# Patient Record
Sex: Female | Born: 1970
Health system: Southern US, Community
[De-identification: ages and names within clinical notes are randomized; demographics above are authoritative.]

## PROBLEM LIST (undated history)

## (undated) DIAGNOSIS — M51369 Other intervertebral disc degeneration, lumbar region without mention of lumbar back pain or lower extremity pain: Secondary | ICD-10-CM

## (undated) DIAGNOSIS — M5136 Other intervertebral disc degeneration, lumbar region: Secondary | ICD-10-CM

## (undated) DIAGNOSIS — M7989 Other specified soft tissue disorders: Secondary | ICD-10-CM

## (undated) DIAGNOSIS — F1010 Alcohol abuse, uncomplicated: Secondary | ICD-10-CM

## (undated) DIAGNOSIS — R7303 Prediabetes: Secondary | ICD-10-CM

## (undated) DIAGNOSIS — E559 Vitamin D deficiency, unspecified: Secondary | ICD-10-CM

## (undated) DIAGNOSIS — I10 Essential (primary) hypertension: Secondary | ICD-10-CM

## (undated) DIAGNOSIS — E785 Hyperlipidemia, unspecified: Secondary | ICD-10-CM

## (undated) DIAGNOSIS — G4733 Obstructive sleep apnea (adult) (pediatric): Secondary | ICD-10-CM

## (undated) DIAGNOSIS — E119 Type 2 diabetes mellitus without complications: Secondary | ICD-10-CM

## (undated) DIAGNOSIS — K219 Gastro-esophageal reflux disease without esophagitis: Secondary | ICD-10-CM

## (undated) DIAGNOSIS — R0683 Snoring: Secondary | ICD-10-CM

## (undated) DIAGNOSIS — F1990 Other psychoactive substance use, unspecified, uncomplicated: Secondary | ICD-10-CM

## (undated) DIAGNOSIS — M797 Fibromyalgia: Secondary | ICD-10-CM

## (undated) DIAGNOSIS — K760 Fatty (change of) liver, not elsewhere classified: Secondary | ICD-10-CM

## (undated) DIAGNOSIS — T7840XA Allergy, unspecified, initial encounter: Secondary | ICD-10-CM

## (undated) DIAGNOSIS — K5790 Diverticulosis of intestine, part unspecified, without perforation or abscess without bleeding: Secondary | ICD-10-CM

## (undated) DIAGNOSIS — R1310 Dysphagia, unspecified: Secondary | ICD-10-CM

## (undated) DIAGNOSIS — D649 Anemia, unspecified: Secondary | ICD-10-CM

## (undated) DIAGNOSIS — E669 Obesity, unspecified: Secondary | ICD-10-CM

## (undated) DIAGNOSIS — K648 Other hemorrhoids: Secondary | ICD-10-CM

## (undated) DIAGNOSIS — M1990 Unspecified osteoarthritis, unspecified site: Secondary | ICD-10-CM

## (undated) DIAGNOSIS — F419 Anxiety disorder, unspecified: Secondary | ICD-10-CM

## (undated) DIAGNOSIS — F32A Depression, unspecified: Secondary | ICD-10-CM

## (undated) DIAGNOSIS — R51 Headache: Secondary | ICD-10-CM

## (undated) DIAGNOSIS — F329 Major depressive disorder, single episode, unspecified: Secondary | ICD-10-CM

## (undated) DIAGNOSIS — G8929 Other chronic pain: Secondary | ICD-10-CM

## (undated) DIAGNOSIS — G43909 Migraine, unspecified, not intractable, without status migrainosus: Secondary | ICD-10-CM

## (undated) DIAGNOSIS — E871 Hypo-osmolality and hyponatremia: Secondary | ICD-10-CM

## (undated) DIAGNOSIS — M549 Dorsalgia, unspecified: Secondary | ICD-10-CM

## (undated) DIAGNOSIS — G4730 Sleep apnea, unspecified: Secondary | ICD-10-CM

## (undated) DIAGNOSIS — R945 Abnormal results of liver function studies: Secondary | ICD-10-CM

## (undated) DIAGNOSIS — R5382 Chronic fatigue, unspecified: Secondary | ICD-10-CM

## (undated) DIAGNOSIS — G4761 Periodic limb movement disorder: Secondary | ICD-10-CM

## (undated) DIAGNOSIS — F1011 Alcohol abuse, in remission: Secondary | ICD-10-CM

## (undated) DIAGNOSIS — M899 Disorder of bone, unspecified: Secondary | ICD-10-CM

## (undated) DIAGNOSIS — F17200 Nicotine dependence, unspecified, uncomplicated: Secondary | ICD-10-CM

## (undated) DIAGNOSIS — F1910 Other psychoactive substance abuse, uncomplicated: Secondary | ICD-10-CM

## (undated) DIAGNOSIS — E538 Deficiency of other specified B group vitamins: Secondary | ICD-10-CM

## (undated) DIAGNOSIS — G2581 Restless legs syndrome: Secondary | ICD-10-CM

## (undated) DIAGNOSIS — R519 Headache, unspecified: Secondary | ICD-10-CM

## (undated) DIAGNOSIS — R7989 Other specified abnormal findings of blood chemistry: Secondary | ICD-10-CM

## (undated) DIAGNOSIS — N189 Chronic kidney disease, unspecified: Secondary | ICD-10-CM

## (undated) DIAGNOSIS — G9332 Myalgic encephalomyelitis/chronic fatigue syndrome: Secondary | ICD-10-CM

## (undated) DIAGNOSIS — K635 Polyp of colon: Secondary | ICD-10-CM

## (undated) DIAGNOSIS — K732 Chronic active hepatitis, not elsewhere classified: Secondary | ICD-10-CM

## (undated) DIAGNOSIS — IMO0002 Reserved for concepts with insufficient information to code with codable children: Secondary | ICD-10-CM

## (undated) HISTORY — DX: Other specified abnormal findings of blood chemistry: R79.89

## (undated) HISTORY — DX: Other psychoactive substance use, unspecified, uncomplicated: F19.90

## (undated) HISTORY — DX: Other chronic pain: G89.29

## (undated) HISTORY — DX: Dorsalgia, unspecified: M54.9

## (undated) HISTORY — DX: Periodic limb movement disorder: G47.61

## (undated) HISTORY — DX: Chronic fatigue, unspecified: R53.82

## (undated) HISTORY — DX: Snoring: R06.83

## (undated) HISTORY — DX: Chronic active hepatitis, not elsewhere classified: K73.2

## (undated) HISTORY — DX: Abnormal results of liver function studies: R94.5

## (undated) HISTORY — DX: Other psychoactive substance abuse, uncomplicated: F19.10

## (undated) HISTORY — DX: Deficiency of other specified B group vitamins: E53.8

## (undated) HISTORY — PX: CERVICAL FUSION: SHX112

## (undated) HISTORY — DX: Other intervertebral disc degeneration, lumbar region without mention of lumbar back pain or lower extremity pain: M51.369

## (undated) HISTORY — DX: Major depressive disorder, single episode, unspecified: F32.9

## (undated) HISTORY — DX: Depression, unspecified: F32.A

## (undated) HISTORY — DX: Headache, unspecified: R51.9

## (undated) HISTORY — DX: Gastro-esophageal reflux disease without esophagitis: K21.9

## (undated) HISTORY — DX: Obstructive sleep apnea (adult) (pediatric): G47.33

## (undated) HISTORY — DX: Essential (primary) hypertension: I10

## (undated) HISTORY — DX: Dysphagia, unspecified: R13.10

## (undated) HISTORY — DX: Obesity, unspecified: E66.9

## (undated) HISTORY — PX: LIVER BIOPSY: SHX301

## (undated) HISTORY — DX: Fibromyalgia: M79.7

## (undated) HISTORY — DX: Allergy, unspecified, initial encounter: T78.40XA

## (undated) HISTORY — DX: Other specified soft tissue disorders: M79.89

## (undated) HISTORY — DX: Migraine, unspecified, not intractable, without status migrainosus: G43.909

## (undated) HISTORY — DX: Sleep apnea, unspecified: G47.30

## (undated) HISTORY — DX: Alcohol abuse, uncomplicated: F10.10

## (undated) HISTORY — DX: Alcohol abuse, in remission: F10.11

## (undated) HISTORY — DX: Disorder of bone, unspecified: M89.9

## (undated) HISTORY — DX: Nicotine dependence, unspecified, uncomplicated: F17.200

## (undated) HISTORY — DX: Chronic kidney disease, unspecified: N18.9

## (undated) HISTORY — DX: Diverticulosis of intestine, part unspecified, without perforation or abscess without bleeding: K57.90

## (undated) HISTORY — DX: Myalgic encephalomyelitis/chronic fatigue syndrome: G93.32

## (undated) HISTORY — DX: Other hemorrhoids: K64.8

## (undated) HISTORY — DX: Fatty (change of) liver, not elsewhere classified: K76.0

## (undated) HISTORY — DX: Anemia, unspecified: D64.9

## (undated) HISTORY — PX: COLONOSCOPY: SHX174

## (undated) HISTORY — DX: Restless legs syndrome: G25.81

## (undated) HISTORY — DX: Reserved for concepts with insufficient information to code with codable children: IMO0002

## (undated) HISTORY — DX: Hyperlipidemia, unspecified: E78.5

## (undated) HISTORY — DX: Polyp of colon: K63.5

## (undated) HISTORY — DX: Hypo-osmolality and hyponatremia: E87.1

## (undated) HISTORY — DX: Unspecified osteoarthritis, unspecified site: M19.90

## (undated) HISTORY — DX: Other intervertebral disc degeneration, lumbar region: M51.36

## (undated) HISTORY — PX: ABDOMINAL HYSTERECTOMY: SHX81

## (undated) HISTORY — DX: Anxiety disorder, unspecified: F41.9

## (undated) HISTORY — DX: Headache: R51

## (undated) HISTORY — DX: Vitamin D deficiency, unspecified: E55.9

## (undated) HISTORY — DX: Type 2 diabetes mellitus without complications: E11.9

---

## 1993-11-22 HISTORY — PX: OTHER SURGICAL HISTORY: SHX169

## 2003-04-20 ENCOUNTER — Emergency Department (HOSPITAL_COMMUNITY): Admission: EM | Admit: 2003-04-20 | Discharge: 2003-04-20 | Payer: Self-pay | Admitting: Emergency Medicine

## 2003-04-20 ENCOUNTER — Encounter: Payer: Self-pay | Admitting: Emergency Medicine

## 2003-11-23 HISTORY — PX: OTHER SURGICAL HISTORY: SHX169

## 2007-05-10 ENCOUNTER — Other Ambulatory Visit: Admission: RE | Admit: 2007-05-10 | Discharge: 2007-05-10 | Payer: Self-pay | Admitting: Gynecology

## 2007-12-31 ENCOUNTER — Emergency Department (HOSPITAL_COMMUNITY): Admission: EM | Admit: 2007-12-31 | Discharge: 2007-12-31 | Payer: Self-pay | Admitting: Emergency Medicine

## 2008-02-23 ENCOUNTER — Encounter: Admission: RE | Admit: 2008-02-23 | Discharge: 2008-02-23 | Payer: Self-pay | Admitting: Sports Medicine

## 2008-03-22 ENCOUNTER — Encounter: Admission: RE | Admit: 2008-03-22 | Discharge: 2008-03-22 | Payer: Self-pay | Admitting: Sports Medicine

## 2008-07-22 ENCOUNTER — Other Ambulatory Visit: Admission: RE | Admit: 2008-07-22 | Discharge: 2008-07-22 | Payer: Self-pay | Admitting: Gynecology

## 2008-07-25 ENCOUNTER — Ambulatory Visit: Payer: Self-pay | Admitting: Gynecology

## 2008-08-09 ENCOUNTER — Ambulatory Visit: Payer: Self-pay | Admitting: Gynecology

## 2008-08-09 HISTORY — PX: INTRAUTERINE DEVICE INSERTION: SHX323

## 2008-09-25 ENCOUNTER — Ambulatory Visit (HOSPITAL_COMMUNITY): Admission: RE | Admit: 2008-09-25 | Discharge: 2008-09-25 | Payer: Self-pay | Admitting: Gastroenterology

## 2008-10-18 ENCOUNTER — Ambulatory Visit (HOSPITAL_COMMUNITY): Admission: RE | Admit: 2008-10-18 | Discharge: 2008-10-18 | Payer: Self-pay | Admitting: Gastroenterology

## 2008-10-18 ENCOUNTER — Encounter (INDEPENDENT_AMBULATORY_CARE_PROVIDER_SITE_OTHER): Payer: Self-pay | Admitting: Interventional Radiology

## 2009-12-09 ENCOUNTER — Encounter: Admission: RE | Admit: 2009-12-09 | Discharge: 2009-12-09 | Payer: Self-pay | Admitting: Family Medicine

## 2010-09-15 ENCOUNTER — Other Ambulatory Visit: Admission: RE | Admit: 2010-09-15 | Discharge: 2010-09-15 | Payer: Self-pay | Admitting: Gynecology

## 2010-09-15 ENCOUNTER — Ambulatory Visit: Payer: Self-pay | Admitting: Gynecology

## 2010-11-11 ENCOUNTER — Ambulatory Visit: Payer: Self-pay | Admitting: Gynecology

## 2010-11-12 ENCOUNTER — Encounter
Admission: RE | Admit: 2010-11-12 | Discharge: 2010-11-12 | Payer: Self-pay | Source: Home / Self Care | Attending: Gynecology | Admitting: Gynecology

## 2010-11-12 ENCOUNTER — Ambulatory Visit: Payer: Self-pay | Admitting: Gynecology

## 2010-12-13 ENCOUNTER — Encounter: Payer: Self-pay | Admitting: Family Medicine

## 2010-12-13 ENCOUNTER — Encounter: Payer: Self-pay | Admitting: Sports Medicine

## 2011-05-31 ENCOUNTER — Other Ambulatory Visit: Payer: Self-pay | Admitting: Neurosurgery

## 2011-05-31 DIAGNOSIS — M542 Cervicalgia: Secondary | ICD-10-CM

## 2011-06-07 ENCOUNTER — Ambulatory Visit
Admission: RE | Admit: 2011-06-07 | Discharge: 2011-06-07 | Disposition: A | Discharge status: Home / Self Care | Payer: BC Managed Care – PPO | Source: Ambulatory Visit | Attending: Neurosurgery | Admitting: Neurosurgery

## 2011-06-07 DIAGNOSIS — M542 Cervicalgia: Secondary | ICD-10-CM

## 2011-06-07 MED ORDER — ONDANSETRON HCL 4 MG/2ML IJ SOLN
4.0000 mg | Freq: Once | INTRAMUSCULAR | Status: AC
  Administered 2011-06-07: 4 mg via INTRAMUSCULAR

## 2011-06-07 MED ORDER — DIAZEPAM 2 MG PO TABS
10.0000 mg | ORAL_TABLET | Freq: Once | ORAL | Status: AC
  Administered 2011-06-07: 10 mg via ORAL

## 2011-06-07 MED ORDER — HYDROMORPHONE HCL 2 MG/ML IJ SOLN
1.0000 mg | Freq: Once | INTRAMUSCULAR | Status: AC
  Administered 2011-06-07: 1 mg via INTRAMUSCULAR

## 2011-06-07 MED ORDER — ONDANSETRON HCL 4 MG/2ML IJ SOLN: 4.0000 mg | Freq: Once | INTRAMUSCULAR | Status: DC

## 2011-06-07 MED ORDER — IOHEXOL 300 MG/ML  SOLN
10.0000 mL | Freq: Once | INTRAMUSCULAR | Status: AC | PRN
  Administered 2011-06-07: 10 mL via INTRATHECAL

## 2011-06-07 NOTE — Progress Notes (Signed)
Pt states she has been off her Tramadol and Lexapro for the past two days.  jkl

## 2011-06-22 ENCOUNTER — Inpatient Hospital Stay (HOSPITAL_COMMUNITY): Payer: BC Managed Care – PPO

## 2011-06-22 ENCOUNTER — Inpatient Hospital Stay (HOSPITAL_COMMUNITY)
Admission: RE | Admit: 2011-06-22 | Discharge: 2011-06-24 | DRG: 865 | Disposition: A | Discharge status: Home / Self Care | Payer: BC Managed Care – PPO | Source: Ambulatory Visit | Attending: Neurosurgery | Admitting: Neurosurgery

## 2011-06-22 DIAGNOSIS — M5030 Other cervical disc degeneration, unspecified cervical region: Principal | ICD-10-CM | POA: Diagnosis present

## 2011-06-22 DIAGNOSIS — F17200 Nicotine dependence, unspecified, uncomplicated: Secondary | ICD-10-CM | POA: Diagnosis present

## 2011-06-22 LAB — HCG, SERUM, QUALITATIVE: Preg, Serum: NEGATIVE

## 2011-06-22 LAB — COMPREHENSIVE METABOLIC PANEL
ALT: 20 U/L (ref 0–35)
AST: 20 U/L (ref 0–37)
Albumin: 3.7 g/dL (ref 3.5–5.2)
Alkaline Phosphatase: 86 U/L (ref 39–117)
Glucose, Bld: 131 mg/dL — ABNORMAL HIGH (ref 70–99)
Potassium: 4.3 mEq/L (ref 3.5–5.1)
Sodium: 138 mEq/L (ref 135–145)
Total Protein: 7.1 g/dL (ref 6.0–8.3)

## 2011-06-22 LAB — CBC
HCT: 39.4 % (ref 36.0–46.0)
Platelets: 305 10*3/uL (ref 150–400)
RDW: 14.7 % (ref 11.5–15.5)
WBC: 12.4 10*3/uL — ABNORMAL HIGH (ref 4.0–10.5)

## 2011-06-29 NOTE — Op Note (Signed)
NAMENEVA, Gloria [name]              ACCOUNT NO.:  [id number]  MEDICAL RECORD NO.:  [id number]  LOCATION:  3006                         FACILITY:  [facility]  PHYSICIAN:  Leeroy Cha, M.D.   DATE OF BIRTH:  10-24-1971  DATE OF PROCEDURE:  06/22/2011 DATE OF DISCHARGE:                              OPERATIVE REPORT   PREOPERATIVE DIAGNOSES:  Degenerative disk disease, C4-C5, C5-C6, and C6- C7 with chronic radiculopathy, chronic neck pain, and lack of lordosis.  POSTOPERATIVE DIAGNOSES:  Degenerative disk disease, C4-C5, C5-C6, and C6-C7 with chronic radiculopathy, chronic neck pain, and lack of lordosis.  PROCEDURES:  C4-C5, C5-C6, and C6-C7 diskectomy, decompression of spinal cord, interbody fusion with PEEK cages, autograft local harvested, DBX, plate, microscope.  SURGEON:  Leeroy Cha, MD  ASSISTANT:  Earleen Newport, MD.  CLINICAL HISTORY:  [name] is a 40 year old female complaining of neck pain radiating to both upper extremities.  The pain is getting worse and has been going on for many years.  X-rays showed severe degenerative disk disease at C4-C5, C5-C6, and C6-C7 with spondylolisthesis at the level of C4-C5 and chronic radiculopathy and also lateral lordosis.  Surgery was advised.  The risks were explained to her.  PROCEDURE IN DETAIL:  The patient was taken to the OR, and after intubation, the left side of the neck was cleaned with DuraPrep. Transverse incision was made through the skin, subcutaneous tissues and down to the cervical spine.  The x-rays showed that we were at the level of C5-C6.  Then anteriorly the anterior osteophytes at the C4-C5, C5-C6, and C6-C7 were removed.  We started first on the level of C5-C6 where with the drill we removed the disk and we went straight into the cervical spine.  Posteriorly, the ligament was quite thick and appears to have quite a bit of narrowed over the foramen.  Decompression of spinal cord as well as  decompression of the second nerve root was done. The same procedure was done at the level of C4-C5 which has  osteophytes mostly on the right side.  Decompression was achieved once we opened the posterior ligament.  At the level of C6-C7,  she had the same finding of degenerative disk disease with foraminal narrowing.  At the end, we had good decompression at C4-C5, C5-C6, and C6-C7 with plenty of room for both nerves, C5, C6, and C7.  Then the endplate was drilled.  At the level of C5-C6, we introduced the PEEK cage with local harvested bone graft as well as DBX, 10 mm height, and lordotic.  Then at the level of C4-C5 and C6-C7 we introduced two 7 mm.  This was followed by plate using the screws.  Lateral cervical spine showed good position of the cages as well as the plate.  The area was irrigated.  We waited 10 minutes just to be sure we had good hemostasis.  Surgicel was applied to the area. Once this was done, the wound was closed with Vicryl and Steri-Strips.          ______________________________ Leeroy Cha, M.D.     EB/MEDQ  D:  06/22/2011  T:  06/23/2011  Job:  [id number]  Electronically Signed by  Leeroy Cha M.D. on 06/29/2011 01:13:20 PM

## 2011-06-29 NOTE — H&P (Signed)
  NAMEROZANNA, CORMANY NO.:  [id number]  MEDICAL RECORD NO.:  [id number]  LOCATION:  3006                         FACILITY:  [facility]  PHYSICIAN:  Leeroy Cha, M.D.   DATE OF BIRTH:  1971-07-06  DATE OF ADMISSION:  06/22/2011 DATE OF DISCHARGE:                             HISTORY & PHYSICAL   __________ I saw this lady again in my office 2 days ago after she has __________ followup __________ bilateral __________.  The strength __________ normal.  __________ degenerative disk disease __________ myelogram showed that she has osteophyte complex in the right side at L4- 5 __________.  With a lack of lordosis, degenerative disk disease at L4-5, L5-6-, L6-7 __________.  The procedure will be a decompression of the L4-5, L5-6, L6- 7 __________.  The patient knew the risk of the surgery include __________ whatsoever __________          ______________________________ Leeroy Cha, M.D.  ----------------------------------------------------------history and physical was redictated on 06/28/11 r  EB/MEDQ  D:  06/22/2011  T:  06/22/2011  Job:  [id number]  Electronically Signed by Leeroy Cha M.D. on 06/29/2011 01:17:21 PM

## 2011-06-29 NOTE — H&P (Signed)
  NAMETIJA, BISS NO.:  [id number]  MEDICAL RECORD NO.:  [id number]  LOCATION:  5789                         FACILITY:  [facility]  PHYSICIAN:  Leeroy Cha, M.D.   DATE OF BIRTH:  26-Nov-1970  DATE OF ADMISSION:  06/22/2011 DATE OF DISCHARGE:  06/24/2011                             HISTORY & PHYSICAL   Ms. Payne is a lady who came to my office in February 2011 complaining of neck pain going to the shoulder, pain both elbows, pain in the knees, and both leg achiness, difficulty with walking.  This problem had been going on for many years.  She has had conservative treatment.  She had an MRI of the cervical spine and she was sent to [place] for further evaluation.  PAST MEDICAL HISTORY:  She had some type of hand surgery by Dr. Percell Miller, also kidney surgery.  She is allergic to BENADRYL.  MEDICATIONS:  She is taking Lexapro, Lunesta, and gabapentin, and magnesium.  SOCIAL HISTORY:  She smokes a pack a day.  She does not drink.  She used to drink until about 2 years ago.  FAMILY HISTORY:  Her mother is 43 years old with asthma.  Father has Barrett syndrome.  REVIEW OF SYSTEMS:  Positive for neck pain, arm pain, depression, and incoordination.  PHYSICAL EXAMINATION:  HEAD, EYES EARS, NOSE AND THROAT:  Normal. NECK:  She is able to flex but extension and lateralization causes discomfort and pain.  She has also tenderness in the trapezius muscle. LUNGS:  Clear. HEART:  Sounds normal. ABDOMEN:  Normal. EXTREMITIES:  Normal pulses. NEUROLOGIC:  Mental status normal.  Cranial nerves normal.  Sensation is normal.  She has weakness of the biceps.  She also has a tingling sensation going to both hands.  The cervical spine x-rays as well as the MRI show reversal of the normal lordosis of the cervical spine with a central disk at the level of C4-C5 with displacement of the thecal sac and spinal cord and disk at the level of C5-6 and C6-7.  The nerve conduction  test showed mild carpal tunnel syndrome.  CLINICAL IMPRESSION:  Degenerative disk disease cervical C4-5, C5-6, and C6-7 with kyphosis.  History of carpal tunnel syndrome.  RECOMMENDATIONS:  The patient is being admitted for surgery.  The procedure will be decompression and fusion at the level of cervical C4- 5, C5-6, and C6-7.  The patient knew the risk with the surgery, was fully explained to her by me in my office on June 09, 2011.  The risk of course are among them infection, CSF leak, no improvement whatsoever because of the chronicity of the pain.          ______________________________ Leeroy Cha, M.D.     EB/MEDQ  D:  06/28/2011  T:  06/29/2011  Job:  [id number]  Electronically Signed by Leeroy Cha M.D. on 06/29/2011 01:13:37 PM

## 2011-08-24 ENCOUNTER — Ambulatory Visit: Payer: BC Managed Care – PPO | Attending: Neurosurgery

## 2011-08-24 DIAGNOSIS — M2569 Stiffness of other specified joint, not elsewhere classified: Secondary | ICD-10-CM | POA: Insufficient documentation

## 2011-08-24 DIAGNOSIS — IMO0001 Reserved for inherently not codable concepts without codable children: Secondary | ICD-10-CM | POA: Insufficient documentation

## 2011-08-24 DIAGNOSIS — M542 Cervicalgia: Secondary | ICD-10-CM | POA: Insufficient documentation

## 2011-08-24 LAB — PROTIME-INR
INR: 1
Prothrombin Time: 13.2

## 2011-08-24 LAB — CBC
HCT: 34 — ABNORMAL LOW
MCHC: 32.9
MCV: 81.9
Platelets: 274
RDW: 14
WBC: 9.3

## 2011-09-02 ENCOUNTER — Ambulatory Visit: Payer: BC Managed Care – PPO

## 2011-09-06 ENCOUNTER — Encounter: Payer: BC Managed Care – PPO | Admitting: Physical Therapy

## 2011-09-08 ENCOUNTER — Encounter: Payer: BC Managed Care – PPO | Admitting: Physical Therapy

## 2011-09-09 ENCOUNTER — Ambulatory Visit: Payer: BC Managed Care – PPO

## 2011-09-13 ENCOUNTER — Ambulatory Visit: Payer: BC Managed Care – PPO

## 2011-09-16 ENCOUNTER — Encounter: Payer: Self-pay | Admitting: Anesthesiology

## 2011-09-20 ENCOUNTER — Encounter: Payer: BC Managed Care – PPO | Admitting: Physical Therapy

## 2011-09-22 ENCOUNTER — Encounter: Payer: BC Managed Care – PPO | Admitting: Gynecology

## 2011-09-22 ENCOUNTER — Encounter: Payer: BC Managed Care – PPO | Admitting: Physical Therapy

## 2011-09-23 ENCOUNTER — Ambulatory Visit: Payer: BC Managed Care – PPO | Attending: Neurosurgery

## 2011-09-23 DIAGNOSIS — IMO0001 Reserved for inherently not codable concepts without codable children: Secondary | ICD-10-CM | POA: Insufficient documentation

## 2011-09-23 DIAGNOSIS — M542 Cervicalgia: Secondary | ICD-10-CM | POA: Insufficient documentation

## 2011-09-23 DIAGNOSIS — M2569 Stiffness of other specified joint, not elsewhere classified: Secondary | ICD-10-CM | POA: Insufficient documentation

## 2011-09-27 ENCOUNTER — Ambulatory Visit: Payer: BC Managed Care – PPO

## 2011-09-29 ENCOUNTER — Encounter: Payer: BC Managed Care – PPO | Admitting: Physical Therapy

## 2011-09-30 ENCOUNTER — Ambulatory Visit: Payer: BC Managed Care – PPO | Admitting: Physical Therapy

## 2011-10-07 ENCOUNTER — Ambulatory Visit: Payer: BC Managed Care – PPO

## 2011-10-18 ENCOUNTER — Encounter: Payer: BC Managed Care – PPO | Admitting: Physical Therapy

## 2011-10-20 ENCOUNTER — Ambulatory Visit: Payer: BC Managed Care – PPO | Admitting: Physical Therapy

## 2011-10-26 ENCOUNTER — Ambulatory Visit: Payer: BC Managed Care – PPO | Attending: Neurosurgery

## 2011-10-26 DIAGNOSIS — M2569 Stiffness of other specified joint, not elsewhere classified: Secondary | ICD-10-CM | POA: Insufficient documentation

## 2011-10-26 DIAGNOSIS — M542 Cervicalgia: Secondary | ICD-10-CM | POA: Insufficient documentation

## 2011-10-26 DIAGNOSIS — IMO0001 Reserved for inherently not codable concepts without codable children: Secondary | ICD-10-CM | POA: Insufficient documentation

## 2011-11-01 ENCOUNTER — Encounter: Payer: BC Managed Care – PPO | Admitting: Physical Therapy

## 2011-11-03 ENCOUNTER — Ambulatory Visit: Payer: BC Managed Care – PPO

## 2012-02-10 ENCOUNTER — Other Ambulatory Visit: Payer: Self-pay | Admitting: Family Medicine

## 2012-02-10 ENCOUNTER — Other Ambulatory Visit: Payer: Self-pay | Admitting: Gynecology

## 2012-02-10 DIAGNOSIS — Z1231 Encounter for screening mammogram for malignant neoplasm of breast: Secondary | ICD-10-CM

## 2012-02-14 ENCOUNTER — Ambulatory Visit
Admission: RE | Admit: 2012-02-14 | Discharge: 2012-02-14 | Disposition: A | Discharge status: Home / Self Care | Payer: BC Managed Care – PPO | Source: Ambulatory Visit | Attending: Family Medicine | Admitting: Family Medicine

## 2012-02-14 DIAGNOSIS — Z1231 Encounter for screening mammogram for malignant neoplasm of breast: Secondary | ICD-10-CM

## 2012-10-11 ENCOUNTER — Encounter: Payer: Self-pay | Admitting: *Deleted

## 2012-10-11 NOTE — Progress Notes (Signed)
Patient ID: Gloria [name], female   DOB: 1971/01/02, 41 y.o.   MRN: [id number] Pt has annual on dec 6 requesting [place] level checked prior to appointment. Left message on vm that she will have blood drawn that day as well. Left message for her to call.

## 2012-10-27 ENCOUNTER — Encounter: Payer: Self-pay | Admitting: Gynecology

## 2012-10-27 ENCOUNTER — Ambulatory Visit (INDEPENDENT_AMBULATORY_CARE_PROVIDER_SITE_OTHER): Payer: BC Managed Care – PPO | Admitting: Gynecology

## 2012-10-27 VITALS — BP 138/88 | Ht 64.25 in | Wt 227.0 lb

## 2012-10-27 DIAGNOSIS — N951 Menopausal and female climacteric states: Secondary | ICD-10-CM

## 2012-10-27 DIAGNOSIS — F1011 Alcohol abuse, in remission: Secondary | ICD-10-CM | POA: Insufficient documentation

## 2012-10-27 DIAGNOSIS — F418 Other specified anxiety disorders: Secondary | ICD-10-CM | POA: Insufficient documentation

## 2012-10-27 DIAGNOSIS — Z23 Encounter for immunization: Secondary | ICD-10-CM

## 2012-10-27 DIAGNOSIS — R635 Abnormal weight gain: Secondary | ICD-10-CM

## 2012-10-27 DIAGNOSIS — Z01419 Encounter for gynecological examination (general) (routine) without abnormal findings: Secondary | ICD-10-CM

## 2012-10-27 DIAGNOSIS — K219 Gastro-esophageal reflux disease without esophagitis: Secondary | ICD-10-CM | POA: Insufficient documentation

## 2012-10-27 DIAGNOSIS — M797 Fibromyalgia: Secondary | ICD-10-CM | POA: Insufficient documentation

## 2012-10-27 DIAGNOSIS — Z87891 Personal history of nicotine dependence: Secondary | ICD-10-CM

## 2012-10-27 LAB — CHOLESTEROL, TOTAL: Cholesterol: 189 mg/dL (ref 0–200)

## 2012-10-27 NOTE — Patient Instructions (Addendum)
Cholesterol Control Diet  Cholesterol levels in your body are determined significantly by your diet. Cholesterol levels may also be related to heart disease. The following material helps to explain this relationship and discusses what you can do to help keep your heart healthy. Not all cholesterol is bad. Low-density lipoprotein (LDL) cholesterol is the "bad" cholesterol. It may cause fatty deposits to build up inside your arteries. High-density lipoprotein (HDL) cholesterol is "good." It helps to remove the "bad" LDL cholesterol from your blood. Cholesterol is a very important risk factor for heart disease. Other risk factors are high blood pressure, smoking, stress, heredity, and weight. The heart muscle gets its supply of blood through the coronary arteries. If your LDL cholesterol is high and your HDL cholesterol is low, you are at risk for having fatty deposits build up in your coronary arteries. This leaves less room through which blood can flow. Without sufficient blood and oxygen, the heart muscle cannot function properly and you may feel chest pains (angina pectoris). When a coronary artery closes up entirely, a part of the heart muscle may die, causing a heart attack (myocardial infarction). CHECKING CHOLESTEROL When your caregiver sends your blood to a lab to be analyzed for cholesterol, a complete lipid (fat) profile may be done. With this test, the total amount of cholesterol and levels of LDL and HDL are determined. Triglycerides are a type of fat that circulates in the blood and can also be used to determine heart disease risk. The list below describes what the numbers should be: Test: Total Cholesterol.  Less than 200 mg/dl.  Test: LDL "bad cholesterol."  Less than 100 mg/dl.   Less than 70 mg/dl if you are at very high risk of a heart attack or sudden cardiac death.  Test: HDL "good cholesterol."  Greater than 50 mg/dl for women.    Greater than 40 mg/dl for men.  Test: Triglycerides.  Less than 150 mg/dl.  CONTROLLING CHOLESTEROL WITH DIET Although exercise and lifestyle factors are important, your diet is key. That is because certain foods are known to raise cholesterol and others to lower it. The goal is to balance foods for their effect on cholesterol and more importantly, to replace saturated and trans fat with other types of fat, such as monounsaturated fat, polyunsaturated fat, and omega-3 fatty acids. On average, a person should consume no more than 15 to 17 g of saturated fat daily. Saturated and trans fats are considered "bad" fats, and they will raise LDL cholesterol. Saturated fats are primarily found in animal products such as meats, butter, and cream. However, that does not mean you need to sacrifice all your favorite foods. Today, there are good tasting, low-fat, low-cholesterol substitutes for most of the things you like to eat. Choose low-fat or nonfat alternatives. Choose round or loin cuts of red meat, since these types of cuts are lowest in fat and cholesterol. Chicken (without the skin), fish, veal, and ground [place] breast are excellent choices. Eliminate fatty meats, such as hot dogs and salami. Even shellfish have little or no saturated fat. Have a 3 oz (85 g) portion when you eat lean meat, poultry, or fish. Trans fats are also called "partially hydrogenated oils." They are oils that have been scientifically manipulated so that they are solid at room temperature resulting in a longer shelf life and improved taste and texture of foods in which they are added. Trans fats are found in stick margarine, some tub margarines, cookies, crackers, and baked goods.  When baking and cooking, oils are an excellent substitute for butter. The monounsaturated oils are especially beneficial since it is believed they lower LDL and raise HDL. The oils you should avoid entirely are saturated tropical oils, such as coconut and  palm.  Remember to eat liberally from food groups that are naturally free of saturated and trans fat, including fish, fruit, vegetables, beans, grains (barley, rice, couscous, bulgur wheat), and pasta (without cream sauces).  IDENTIFYING FOODS THAT LOWER CHOLESTEROL  Soluble fiber may lower your cholesterol. This type of fiber is found in fruits such as apples, vegetables such as broccoli, potatoes, and carrots, legumes such as beans, peas, and lentils, and grains such as barley. Foods fortified with plant sterols (phytosterol) may also lower cholesterol. You should eat at least 2 g per day of these foods for a cholesterol lowering effect.  Read package labels to identify low-saturated fats, trans fats free, and low-fat foods at the supermarket. Select cheeses that have only 2 to 3 g saturated fat per ounce. Use a heart-healthy tub margarine that is free of trans fats or partially hydrogenated oil. When buying baked goods (cookies, crackers), avoid partially hydrogenated oils. Breads and muffins should be made from whole grains (whole-wheat or whole oat flour, instead of "flour" or "enriched flour"). Buy non-creamy canned soups with reduced salt and no added fats.  FOOD PREPARATION TECHNIQUES  Never deep-fry. If you must fry, either stir-fry, which uses very little fat, or use non-stick cooking sprays. When possible, broil, bake, or roast meats, and steam vegetables. Instead of dressing vegetables with butter or margarine, use lemon and herbs, applesauce and cinnamon (for squash and sweet potatoes), nonfat yogurt, salsa, and low-fat dressings for salads.  LOW-SATURATED FAT / LOW-FAT FOOD SUBSTITUTES Meats / Saturated Fat (g)  Avoid: Steak, marbled (3 oz/85 g) / 11 g   Choose: Steak, lean (3 oz/85 g) / 4 g   Avoid: Hamburger (3 oz/85 g) / 7 g   Choose: Hamburger, lean (3 oz/85 g) / 5 g   Avoid: Ham (3 oz/85 g) / 6 g   Choose: Ham, lean cut (3 oz/85 g) / 2.4 g   Avoid: Chicken, with skin, dark  meat (3 oz/85 g) / 4 g   Choose: Chicken, skin removed, dark meat (3 oz/85 g) / 2 g   Avoid: Chicken, with skin, light meat (3 oz/85 g) / 2.5 g   Choose: Chicken, skin removed, light meat (3 oz/85 g) / 1 g  Dairy / Saturated Fat (g)  Avoid: Whole milk (1 cup) / 5 g   Choose: Low-fat milk, 2% (1 cup) / 3 g   Choose: Low-fat milk, 1% (1 cup) / 1.5 g   Choose: Skim milk (1 cup) / 0.3 g   Avoid: Hard cheese (1 oz/28 g) / 6 g   Choose: Skim milk cheese (1 oz/28 g) / 2 to 3 g   Avoid: Cottage cheese, 4% fat (1 cup) / 6.5 g   Choose: Low-fat cottage cheese, 1% fat (1 cup) / 1.5 g   Avoid: Ice cream (1 cup) / 9 g   Choose: Sherbet (1 cup) / 2.5 g   Choose: Nonfat frozen yogurt (1 cup) / 0.3 g   Choose: Frozen fruit bar / trace   Avoid: Whipped cream (1 tbs) / 3.5 g   Choose: Nondairy whipped topping (1 tbs) / 1 g  Condiments / Saturated Fat (g)  Avoid: Mayonnaise (1 tbs) / 2 g   Choose: Low-fat  mayonnaise (1 tbs) / 1 g   Avoid: Butter (1 tbs) / 7 g   Choose: Extra light margarine (1 tbs) / 1 g   Avoid: Coconut oil (1 tbs) / 11.8 g   Choose: Olive oil (1 tbs) / 1.8 g   Choose: Corn oil (1 tbs) / 1.7 g   Choose: Safflower oil (1 tbs) / 1.2 g   Choose: Sunflower oil (1 tbs) / 1.4 g   Choose: Soybean oil (1 tbs) / 2.4 g   Choose: Canola oil (1 tbs) / 1 g  Document Released: 11/08/2005 Document Revised: 07/21/2011 Document Reviewed: 04/29/2011 [facility] Patient Information 2012 [place], [place].  Exercise to Lose Weight Exercise and a healthy diet may help you lose weight. Your doctor may suggest specific exercises. EXERCISE IDEAS AND TIPS  Choose low-cost things you enjoy doing, such as walking, bicycling, or exercising to workout videos.   Take stairs instead of the elevator.   Walk during your lunch break.   Park your car further away from work or school.   Go to a gym or an exercise class.   Start with 5 to 10 minutes of exercise each day. Build up to  30 minutes of exercise 4 to 6 days a week.   Wear shoes with good support and comfortable clothes.   Stretch before and after working out.   Work out until you breathe harder and your heart beats faster.   Drink extra water when you exercise.   Do not do so much that you hurt yourself, feel dizzy, or get very short of breath.  Exercises that burn about 150 calories:  Running 1  miles in 15 minutes.   Playing volleyball for 45 to 60 minutes.   Washing and waxing a car for 45 to 60 minutes.   Playing touch football for 45 minutes.   Walking 1  miles in 35 minutes.   Pushing a stroller 1  miles in 30 minutes.   Playing basketball for 30 minutes.   Raking leaves for 30 minutes.   Bicycling 5 miles in 30 minutes.   Walking 2 miles in 30 minutes.   Dancing for 30 minutes.   Shoveling snow for 15 minutes.   Swimming laps for 20 minutes.   Walking up stairs for 15 minutes.   Bicycling 4 miles in 15 minutes.   Gardening for 30 to 45 minutes.   Jumping rope for 15 minutes.   Washing windows or floors for 45 to 60 minutes.  Document Released: 12/11/2010 Document Revised: 07/21/2011 Document Reviewed: 12/11/2010 [facility] Patient Information 2012 [place]. Perimenopause Perimenopause is the time when your body begins to move into the menopause (no menstrual period for 12 straight months). It is a natural process. Perimenopause can begin 2 to 8 years before the menopause and usually lasts for one year after the menopause. During this time, your ovaries may or may not produce an egg. The ovaries vary in their production of estrogen and progesterone hormones each month. This can cause irregular menstrual periods, difficulty in getting pregnant, vaginal bleeding between periods and uncomfortable symptoms. CAUSES  Irregular production of the ovarian hormones, estrogen and progesterone, and not ovulating every month.  Other causes include:  Tumor of the pituitary gland  in the brain.  Medical disease that affects the ovaries.  Radiation treatment.  Chemotherapy.  Unknown causes.  Heavy smoking and excessive alcohol intake can bring on perimenopause sooner. SYMPTOMS   Hot flashes.  Night sweats.  Irregular menstrual  periods.  Decrease sex drive.  Vaginal dryness.  Headaches.  Mood swings.  Depression.  Memory problems.  Irritability.  Tiredness.  Weight gain.  Trouble getting pregnant.  The beginning of losing bone cells (osteoporosis).  The beginning of hardening of the arteries (atherosclerosis). DIAGNOSIS  Your caregiver will make a diagnosis by analyzing your age, menstrual history and your symptoms. They will do a physical exam noting any changes in your body, especially your female organs. Female hormone tests may or may not be helpful depending on the amount and when you produce the female hormones. However, other hormone tests may be helpful (ex. thyroid hormone) to rule out other problems. TREATMENT  The decision to treat during the perimenopause should be made by you and your caregiver depending on how the symptoms are affecting you and your life style. There are various treatments available such as:  Treating individual symptoms with a specific medication for that symptom (ex. tranquilizer for depression).  Herbal medications that can help specific symptoms.  Counseling.  Group therapy.  No treatment. HOME CARE INSTRUCTIONS   Before seeing your caregiver, make a list of your menstrual periods (when the occur, how heavy they are, how long between periods and how long they last), your symptoms and when they started.  Take the medication as recommended by your caregiver.  Sleep and rest.  Exercise.  Eat a diet that contains calcium (good for your bones) and soy (acts like estrogen hormone).  Do not smoke.  Avoid alcoholic beverages.  Taking vitamin E may help in certain cases.  Take calcium and vitamin  D supplements to help prevent bone loss.  Group therapy is sometimes helpful.  Acupuncture may help in some cases. SEEK MEDICAL CARE IF:   You have any of the above and want to know if it is perimenopause.  You want advice and treatment for any of your symptoms mentioned above.  You need a referral to a specialist (gynecologist, psychiatrist or psychologist). SEEK IMMEDIATE MEDICAL CARE IF:   You have vaginal bleeding.  Your period lasts longer than 8 days.  You periods are recurring sooner than 21 days.  You have bleeding after intercourse.  You have severe depression.  You have pain when you urinate.  You have severe headaches.  You develop vision problems. Document Released: 12/16/2004 Document Revised: 01/31/2012 Document Reviewed: 09/05/2008 [facility] Patient Information 2013 [place].   Tetanus, Diphtheria, Pertussis (Tdap) Vaccine What You Need to Know WHY GET VACCINATED? Tetanus, diphtheria and pertussis can be very serious diseases, even for adolescents and adults. Tdap vaccine can protect [place] from these diseases. TETANUS (Lockjaw) causes painful muscle tightening and stiffness, usually all over the body.  It can lead to tightening of muscles in the head and neck so you can't open your mouth, swallow, or sometimes even breathe. Tetanus kills about 1 out of 5 people who are infected. DIPHTHERIA can cause a thick coating to form in the back of the throat.  It can lead to breathing problems, paralysis, heart failure, and death. PERTUSSIS (Whooping Cough) causes severe coughing spells, which can cause difficulty breathing, vomiting and disturbed sleep.  It can also lead to weight loss, incontinence, and rib fractures. Up to 2 in 100 adolescents and 5 in 100 adults with pertussis are hospitalized or have complications, which could include pneumonia and death. These diseases are caused by bacteria. Diphtheria and pertussis are spread from person to person through  coughing or sneezing. Tetanus enters the body through cuts, scratches,  or wounds. Before vaccines, the [place] saw as many as 200,000 cases a year of diphtheria and pertussis, and hundreds of cases of tetanus. Since vaccination began, tetanus and diphtheria have dropped by about 99% and pertussis by about 80%. TDAP VACCINE Tdap vaccine can protect adolescents and adults from tetanus, diphtheria, and pertussis. One dose of Tdap is routinely given at age 43 or 72. People who did not get Tdap at that age should get it as soon as possible. Tdap is especially important for health care professionals and anyone having close contact with a baby younger than 12 months. Pregnant women should get a dose of Tdap during every pregnancy, to protect the newborn from pertussis. Infants are most at risk for severe, life-threatening complications from pertussis. A similar vaccine, called Td, protects from tetanus and diphtheria, but not pertussis. A Td booster should be given every 10 years. Tdap may be given as one of these boosters if you have not already gotten a dose. Tdap may also be given after a severe cut or burn to prevent tetanus infection. Your doctor can give you more information. Tdap may safely be given at the same time as other vaccines. SOME PEOPLE SHOULD NOT GET THIS VACCINE  If you ever had a life-threatening allergic reaction after a dose of any tetanus, diphtheria, or pertussis containing vaccine, OR if you have a severe allergy to any part of this vaccine, you should not get Tdap. Tell your doctor if you have any severe allergies.  If you had a coma, or long or multiple seizures within 7 days after a childhood dose of DTP or DTaP, you should not get Tdap, unless a cause other than the vaccine was found. You can still get Td.  Talk to your doctor if you:  have epilepsy or another nervous system problem,  had severe pain or swelling after any vaccine containing diphtheria, tetanus or  pertussis,  ever had Guillain-Barr Syndrome (GBS),  aren't feeling well on the day the shot is scheduled. RISKS OF A VACCINE REACTION With any medicine, including vaccines, there is a chance of side effects. These are usually mild and go away on their own, but serious reactions are also possible. Brief fainting spells can follow a vaccination, leading to injuries from falling. Sitting or lying down for about 15 minutes can help prevent these. Tell your doctor if you feel dizzy or light-headed, or have vision changes or ringing in the ears. Mild problems following Tdap (Did not interfere with activities)  Pain where the shot was given (about 3 in 4 adolescents or 2 in 3 adults)  Redness or swelling where the shot was given (about 1 person in 5)  Mild fever of at least 100.96F (up to about 1 in 25 adolescents or 1 in 100 adults)  Headache (about 3 or 4 people in 10)  Tiredness (about 1 person in 3 or 4)  Nausea, vomiting, diarrhea, stomach ache (up to 1 in 4 adolescents or 1 in 10 adults)  Chills, body aches, sore joints, rash, swollen glands (uncommon) Moderate problems following Tdap (Interfered with activities, but did not require medical attention)  Pain where the shot was given (about 1 in 5 adolescents or 1 in 100 adults)  Redness or swelling where the shot was given (up to about 1 in 16 adolescents or 1 in 25 adults)  Fever over 102F (about 1 in 100 adolescents or 1 in 250 adults)  Headache (about 3 in 20 adolescents or 1 in  10 adults)  Nausea, vomiting, diarrhea, stomach ache (up to 1 or 3 people in 100)  Swelling of the entire arm where the shot was given (up to about 3 in 100). Severe problems following Tdap (Unable to perform usual activities, required medical attention)  Swelling, severe pain, bleeding and redness in the arm where the shot was given (rare). A severe allergic reaction could occur after any vaccine (estimated less than 1 in a million doses). WHAT IF  THERE IS A SERIOUS REACTION? What should I look for?  Look for anything that concerns you, such as signs of a severe allergic reaction, very high fever, or behavior changes. Signs of a severe allergic reaction can include hives, swelling of the face and throat, difficulty breathing, a fast heartbeat, dizziness, and weakness. These would start a few minutes to a few hours after the vaccination. What should I do?  If you think it is a severe allergic reaction or other emergency that can't wait, call 9-1-1 or get the person to the nearest hospital. Otherwise, call your doctor.  Afterward, the reaction should be reported to the "Vaccine Adverse Event Reporting System" (VAERS). Your doctor might file this report, or you can do it yourself through the VAERS web site at www.vaers.SamedayNews.es, or by calling (812)479-4099. VAERS is only for reporting reactions. They do not give medical advice.  THE NATIONAL VACCINE INJURY COMPENSATION PROGRAM The National Vaccine Injury Compensation Program (VICP) is a federal program that was created to compensate people who may have been injured by certain vaccines. Persons who believe they may have been injured by a vaccine can learn about the program and about filing a claim by calling 575-242-8271 or visiting the [place] website at GoldCloset.com.ee. HOW CAN I LEARN MORE?  Ask your doctor.  Call your local or state health department.  Contact the Centers for Disease Control and Prevention (CDC):  Call 956-465-3202 or visit CDC's website at http://hunter.com/ CDC Tdap Vaccine VIS (03/30/12) Document Released: 05/09/2012 Document Reviewed: 05/09/2012 [facility] Patient Information 2013 [place].

## 2012-10-28 DIAGNOSIS — K709 Alcoholic liver disease, unspecified: Secondary | ICD-10-CM | POA: Insufficient documentation

## 2012-10-28 DIAGNOSIS — Z87891 Personal history of nicotine dependence: Secondary | ICD-10-CM | POA: Insufficient documentation

## 2012-10-28 DIAGNOSIS — E663 Overweight: Secondary | ICD-10-CM | POA: Insufficient documentation

## 2012-10-28 LAB — URINALYSIS W MICROSCOPIC + REFLEX CULTURE
Bacteria, UA: NONE SEEN
Casts: NONE SEEN
Crystals: NONE SEEN
Ketones, ur: NEGATIVE mg/dL
Leukocytes, UA: NEGATIVE
Nitrite: NEGATIVE
Specific Gravity, Urine: 1.008 (ref 1.005–1.030)
Squamous Epithelial / LPF: NONE SEEN
Urobilinogen, UA: 0.2 mg/dL (ref 0.0–1.0)
pH: 6.5 (ref 5.0–8.0)

## 2012-10-28 LAB — HEMOGLOBIN A1C: Mean Plasma Glucose: 128 mg/dL — ABNORMAL HIGH (ref <117)

## 2012-10-28 NOTE — Progress Notes (Addendum)
Gloria [name] May 04, 1971 [id number]   History:    41 y.o.  for annual gyn exam c/o menopause like symptoms of occasional hot flashes and had episode of irregular menses. Patient has a Paraguard IUD that was placed in 2009. Patient has not been seen in the office for 2 years. She has past history of ETOH and has been sober for for 4 years and stopped smoking March 2013. She is being followed and treated for Fibromyalgia by Dr. Patrecia Pour who has been doing her lab work. Patient does not recall having received the Tdap vaccine but recently had her flu vaccine.  She states in college she had cervical dysplasia and had cryo treatment of her cervix. Last PaP in 2011 and it was benign. She stated she had normal cycles until August and did not have one until Dec which lasted 3 days.  Past medical history,surgical history, family history and social history were all reviewed and documented in the EPIC chart.  Gynecologic History Patient's last menstrual period was 10/16/2012. Contraception: IUD Last Pap: 2011. Results were: normal Last mammogram: 2013 . Results were: normal  Obstetric History OB History    Grav Para Term Preterm Abortions TAB SAB Ect Mult Living   1 1 1       1      # Outc Date GA Lbr Len/2nd Wgt Sex Del Anes PTL Lv   1 TRM     M SVD  No Yes       ROS: A ROS was performed and pertinent positives and negatives are included in the history.  GENERAL: No fevers or chills. HEENT: No change in vision, no earache, sore throat or sinus congestion. NECK: No pain or stiffness. CARDIOVASCULAR: No chest pain or pressure. No palpitations. PULMONARY: No shortness of breath, cough or wheeze. GASTROINTESTINAL: No abdominal pain, nausea, vomiting or diarrhea, melena or bright red blood per rectum. GENITOURINARY: No urinary frequency, urgency, hesitancy or dysuria. MUSCULOSKELETAL: No joint or muscle pain, no back pain, no recent trauma. DERMATOLOGIC: No rash, no itching, no lesions. ENDOCRINE: No  polyuria, polydipsia, no heat or cold intolerance. No recent change in weight. HEMATOLOGICAL: No anemia or easy bruising or bleeding. NEUROLOGIC: No headache, seizures, numbness, tingling or weakness. PSYCHIATRIC: No depression, no loss of interest in normal activity or change in sleep pattern.     Exam: chaperone present  BP 138/88  Ht 5' 4.25" (1.632 m)  Wt 227 lb (102.967 kg)  BMI 38.66 kg/m2  LMP 10/16/2012  Body mass index is 38.66 kg/(m^2).  General appearance : Well developed well nourished female. No acute distress HEENT: Neck supple, trachea midline, no carotid bruits, no thyroidmegaly Lungs: Clear to auscultation, no rhonchi or wheezes, or rib retractions  Heart: Regular rate and rhythm, no murmurs or gallops Breast:Examined in sitting and supine position were symmetrical in appearance, no palpable masses or tenderness,  no skin retraction, no nipple inversion, no nipple discharge, no skin discoloration, no axillary or supraclavicular lymphadenopathy Abdomen: no palpable masses or tenderness, no rebound or guarding Extremities: no edema or skin discoloration or tenderness  Pelvic:  Bartholin, Urethra, Skene Glands: Within normal limits             Vagina: No gross lesions or discharge  Cervix: No gross lesions or discharge, IUD string sen.  Uterus  AV, normal size, shape and consistency, non-tender and mobile  Adnexa  Without masses or tenderness  Anus and perineum  normal   Rectovaginal  normal sphincter tone without palpated masses  or tenderness             Hemoccult not done     Assessment/Plan:  41 y.o. female for annual exam on several medications for Fibromyalgia, depression and anxiety who is overweight. Will check TSH, FSH and HgBA1C, cholesterol and u/a today and have her keep a menstrual calender for the next 6 months and then to follow up with me. She will need to check with her psychiatrist to adjust her antidepressants. tDap  Vaccine administered today.  Exercise/ appropriate diet discussed. Calcium and vitamin D recommended.  Review of patients record demonstrated also the following:  1.2009: LIVER, NEEDLE CORE BIOPSIES: MILD CHRONIC ACTIVE HEPATITIS WITH INCREASED FIBROSIS.   2.2011 ultrasound: Right ovarian cyst 25 x 28 x 21 mm thick walled and irregular  with low level echos. Left ovary was normal. Normal Ca 125   3. Bone Density baseline due to ETOH abuse 2011: Z Scores -1.3 left femoral neck and -1.1 right femoral neck    Terrance Mass MD, 4:29 PM 10/28/2012

## 2012-10-30 ENCOUNTER — Other Ambulatory Visit: Payer: Self-pay | Admitting: Gynecology

## 2012-10-30 DIAGNOSIS — R7309 Other abnormal glucose: Secondary | ICD-10-CM

## 2012-11-13 ENCOUNTER — Other Ambulatory Visit: Payer: BC Managed Care – PPO

## 2012-11-24 ENCOUNTER — Other Ambulatory Visit: Payer: BC Managed Care – PPO

## 2012-11-24 DIAGNOSIS — R7309 Other abnormal glucose: Secondary | ICD-10-CM

## 2012-11-27 ENCOUNTER — Other Ambulatory Visit: Payer: Self-pay | Admitting: Gynecology

## 2012-11-27 DIAGNOSIS — R7303 Prediabetes: Secondary | ICD-10-CM

## 2012-11-27 DIAGNOSIS — R7309 Other abnormal glucose: Secondary | ICD-10-CM

## 2012-11-30 ENCOUNTER — Other Ambulatory Visit: Payer: BC Managed Care – PPO

## 2012-11-30 DIAGNOSIS — R7309 Other abnormal glucose: Secondary | ICD-10-CM

## 2012-12-01 LAB — COMPREHENSIVE METABOLIC PANEL
ALT: 19 U/L (ref 0–35)
Alkaline Phosphatase: 76 U/L (ref 39–117)
CO2: 27 mEq/L (ref 19–32)
Creat: 0.58 mg/dL (ref 0.50–1.10)
Glucose, Bld: 81 mg/dL (ref 70–99)
Total Bilirubin: 0.3 mg/dL (ref 0.3–1.2)

## 2012-12-01 LAB — LIPID PANEL
HDL: 39 mg/dL — ABNORMAL LOW (ref >39)
LDL Cholesterol: 110 mg/dL — ABNORMAL HIGH (ref 0–99)
Total CHOL/HDL Ratio: 5.3 Ratio
Triglycerides: 280 mg/dL — ABNORMAL HIGH (ref <150)

## 2012-12-06 ENCOUNTER — Institutional Professional Consult (permissible substitution): Payer: BC Managed Care – PPO | Admitting: Gynecology

## 2013-01-22 ENCOUNTER — Encounter: Payer: BC Managed Care – PPO | Attending: Family Medicine | Admitting: *Deleted

## 2013-01-22 ENCOUNTER — Encounter: Payer: Self-pay | Admitting: *Deleted

## 2013-01-22 VITALS — Ht 66.0 in | Wt 231.5 lb

## 2013-01-22 DIAGNOSIS — E669 Obesity, unspecified: Secondary | ICD-10-CM | POA: Insufficient documentation

## 2013-01-22 DIAGNOSIS — E663 Overweight: Secondary | ICD-10-CM | POA: Insufficient documentation

## 2013-01-22 DIAGNOSIS — E785 Hyperlipidemia, unspecified: Secondary | ICD-10-CM | POA: Insufficient documentation

## 2013-01-22 DIAGNOSIS — Z713 Dietary counseling and surveillance: Secondary | ICD-10-CM | POA: Insufficient documentation

## 2013-01-22 NOTE — Patient Instructions (Signed)
Plan:  Aim for 3 Carb Choices per meal (45 grams) +/- 1 either way  Aim for 0-1 Carbs per snack if hungry  Consider reading food labels for Total Carbohydrate of foods Assess your serving sizes of added fat and [place] intake Consider use of Calorie [place] APP for more nutrition information

## 2013-01-22 NOTE — Progress Notes (Signed)
  Medical Nutrition Therapy:  Appt start time: 7425 end time:  1715.  Assessment:  Primary concerns today: patient here for obesity and hyperlipidemia. Lives with 43 yo son, husband who will be leaving. She shops and prepares meals unless husband in town. Eats out infrequently due to limited finances. 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 Has problems with balance with fibromyalgia. Enjoyed bike riding in past.  MEDICATIONS: see list   DIETARY INTAKE:  Usual eating pattern includes 3 meals and 2 snacks per day.  Everyday foods include good variety of all food groups.  Avoided foods include caffeine now.    24-hr recall:  B (9 AM): has started now: Fiber One bar and a Yoplait and fresh fruit  Snk ( AM): none  L ( PM): lunch break is an AA meeting, comes back for lunch:small tortilla with lean meat, swiss cheese, lettuce, mayo and mustard, chips and water or fruit juice or Propel water Snk ( PM): salty food like pretzel chips or snack mix or chocolate granola bar or dried fruit D ( PM): hamburger helper with a salad OR baked chicken or pork with steamed vegetable or casserole and pasta sometimes with sauce, Gnigerale Snk ( PM):1-2 scoops of ice cream Beverages: water, Propel, fruit juice, or Gingerale  Usual physical activity: limited due to fibromyalgia and also now has a desk job with longer hours  Estimated energy needs: 1400 calories 158 g carbohydrates 105 g protein 39 g fat  Progress Towards Goal(s):  In progress.   Nutritional Diagnosis:  NI-1.5 Excessive energy intake As related to activity level.  As evidenced by BMI of 37.4.    Intervention:  Nutrition counseling for weight loss initiated. Discussed Carb Counting as method of portion control. Her protein intake appears appropriate and I  plan to discuss fat in more detail at next visit. Also reviewed reading food labels and the benefits of increased activity.Plan to review break down of types of fat and factors affecting triglycerides at next visit too.  Plan:  Aim for 3 Carb Choices per meal (45 grams) +/- 1 either way  Aim for 0-1 Carbs per snack if hungry  Consider reading food labels for Total Carbohydrate of foods Assess your serving sizes of added fat and Gingerale intake Consider use of Calorie [employer] APP for more nutrition information   Handouts given during visit include: Carb Counting and Food Label handouts Meal Plan Card   Monitoring/Evaluation:  Dietary intake, exercise, reading food labels, and body weight in 4 week(s).

## 2013-01-23 ENCOUNTER — Emergency Department (HOSPITAL_COMMUNITY): Payer: BC Managed Care – PPO

## 2013-01-23 ENCOUNTER — Encounter (HOSPITAL_COMMUNITY): Payer: Self-pay | Admitting: Emergency Medicine

## 2013-01-23 ENCOUNTER — Emergency Department (HOSPITAL_COMMUNITY)
Admission: EM | Admit: 2013-01-23 | Discharge: 2013-01-23 | Disposition: A | Discharge status: Home / Self Care | Payer: BC Managed Care – PPO | Attending: Emergency Medicine | Admitting: Emergency Medicine

## 2013-01-23 DIAGNOSIS — E785 Hyperlipidemia, unspecified: Secondary | ICD-10-CM | POA: Insufficient documentation

## 2013-01-23 DIAGNOSIS — R1011 Right upper quadrant pain: Secondary | ICD-10-CM | POA: Insufficient documentation

## 2013-01-23 DIAGNOSIS — Z87898 Personal history of other specified conditions: Secondary | ICD-10-CM | POA: Insufficient documentation

## 2013-01-23 DIAGNOSIS — Z8739 Personal history of other diseases of the musculoskeletal system and connective tissue: Secondary | ICD-10-CM | POA: Insufficient documentation

## 2013-01-23 DIAGNOSIS — Z87891 Personal history of nicotine dependence: Secondary | ICD-10-CM | POA: Insufficient documentation

## 2013-01-23 DIAGNOSIS — F1021 Alcohol dependence, in remission: Secondary | ICD-10-CM | POA: Insufficient documentation

## 2013-01-23 DIAGNOSIS — Z79899 Other long term (current) drug therapy: Secondary | ICD-10-CM | POA: Insufficient documentation

## 2013-01-23 DIAGNOSIS — R110 Nausea: Secondary | ICD-10-CM | POA: Insufficient documentation

## 2013-01-23 LAB — CBC WITH DIFFERENTIAL/PLATELET
Basophils Absolute: 0.1 10*3/uL (ref 0.0–0.1)
Basophils Relative: 0 % (ref 0–1)
Eosinophils Absolute: 0.1 10*3/uL (ref 0.0–0.7)
Eosinophils Relative: 1 % (ref 0–5)
HCT: 37.4 % (ref 36.0–46.0)
Hemoglobin: 12.1 g/dL (ref 12.0–15.0)
Lymphocytes Relative: 21 % (ref 12–46)
Lymphs Abs: 3.8 10*3/uL (ref 0.7–4.0)
MCH: 26.6 pg (ref 26.0–34.0)
MCHC: 32.4 g/dL (ref 30.0–36.0)
MCV: 82.2 fL (ref 78.0–100.0)
Monocytes Absolute: 1.1 10*3/uL — ABNORMAL HIGH (ref 0.1–1.0)
Monocytes Relative: 6 % (ref 3–12)
Neutro Abs: 13.3 10*3/uL — ABNORMAL HIGH (ref 1.7–7.7)
Neutrophils Relative %: 73 % (ref 43–77)
Platelets: 318 10*3/uL (ref 150–400)
RBC: 4.55 MIL/uL (ref 3.87–5.11)
RDW: 14.8 % (ref 11.5–15.5)
WBC: 18.3 10*3/uL — ABNORMAL HIGH (ref 4.0–10.5)

## 2013-01-23 LAB — COMPREHENSIVE METABOLIC PANEL
ALT: 17 U/L (ref 0–35)
AST: 16 U/L (ref 0–37)
Albumin: 3.7 g/dL (ref 3.5–5.2)
Alkaline Phosphatase: 92 U/L (ref 39–117)
BUN: 8 mg/dL (ref 6–23)
CO2: 22 mEq/L (ref 19–32)
Calcium: 9.1 mg/dL (ref 8.4–10.5)
Chloride: 96 mEq/L (ref 96–112)
Creatinine, Ser: 0.69 mg/dL (ref 0.50–1.10)
GFR calc Af Amer: >90 mL/min (ref >90)
GFR calc non Af Amer: >90 mL/min (ref >90)
Glucose, Bld: 81 mg/dL (ref 70–99)
Potassium: 3.6 mEq/L (ref 3.5–5.1)
Sodium: 133 mEq/L — ABNORMAL LOW (ref 135–145)
Total Bilirubin: 0.3 mg/dL (ref 0.3–1.2)
Total Protein: 7.4 g/dL (ref 6.0–8.3)

## 2013-01-23 LAB — URINE MICROSCOPIC-ADD ON

## 2013-01-23 LAB — URINALYSIS, ROUTINE W REFLEX MICROSCOPIC
Bilirubin Urine: NEGATIVE
Glucose, UA: NEGATIVE mg/dL
Hgb urine dipstick: NEGATIVE
Ketones, ur: NEGATIVE mg/dL
Nitrite: NEGATIVE
Protein, ur: NEGATIVE mg/dL
Specific Gravity, Urine: 1.011 (ref 1.005–1.030)
Urobilinogen, UA: 0.2 mg/dL (ref 0.0–1.0)
pH: 7 (ref 5.0–8.0)

## 2013-01-23 LAB — LIPASE, BLOOD: Lipase: 23 U/L (ref 11–59)

## 2013-01-23 NOTE — ED Notes (Signed)
Dr. Kohut at bedside 

## 2013-01-23 NOTE — ED Notes (Signed)
Pt ambulatory leaving ED; pt alert and mentating appropriately upon d/c. Pt given d/c teaching and follow up care instructions; pt verbalizes understanding and has no further questions upon d/c.

## 2013-01-23 NOTE — ED Notes (Signed)
Pt states she had blood work done at her doctors office today and was sent here for further evaluation to rule out causes of pain; pt alert and mentating appropriately; pt states she is a recovering alcoholic and has not had a drink since 2009

## 2013-01-23 NOTE — ED Notes (Signed)
Pt c/o RUQ pain off and on for months.  St's today the pain is worse and it times it goes into her back.  Nausea without vomiting.

## 2013-01-25 ENCOUNTER — Other Ambulatory Visit: Payer: Self-pay

## 2013-01-25 DIAGNOSIS — Z1231 Encounter for screening mammogram for malignant neoplasm of breast: Secondary | ICD-10-CM

## 2013-01-25 NOTE — ED Provider Notes (Signed)
History    42 year old female with abdominal pain. Right upper quadrant. Pain is intermittent. Onset about 4 months ago. Pain is often worse shortly after eating but she also has the pain intermittently throughout the day. Lasts anywhere from about half an hour to 2 hours. Nausea, but not vomiting. No urinary complaints.  No fever or chills.   CSN: [id number]  Arrival date & time 01/23/13  1610   First MD Initiated Contact with Patient 01/23/13 1631      Chief Complaint  Patient presents with  . Abdominal Pain    (Consider location/radiation/quality/duration/timing/severity/associated sxs/prior treatment) HPI  Past Medical History  Diagnosis Date  . Smoker   . History of ETOH abuse   . Elevated LFTs   . Fibromyalgia   . Bulging disc     X 2  . Arthritis   . Hyperlipidemia     Past Surgical History  Procedure Laterality Date  . Intrauterine device insertion  08/09/2008    PARAGUARD  . Neck surgery      Family History  Problem Relation Age of Onset  . Breast cancer Mother   . Breast cancer Maternal Aunt   . Breast cancer Paternal Grandmother     COLON  . Cancer Paternal Grandfather     THROAT .[name]    History  Substance Use Topics  . Smoking status: Former Smoker -- 1.00 packs/day    Quit date: 01/26/2012  . Smokeless tobacco: Never Used  . Alcohol Use: No    OB History   Grav Para Term Preterm Abortions TAB SAB Ect Mult Living   1 1 1       1       Review of Systems  All systems reviewed and negative, other than as noted in HPI.   Allergies  Benadryl  Home Medications   Current Outpatient Rx  Name  Route  Sig  Dispense  Refill  . Armodafinil (NUVIGIL) 250 MG tablet   Oral   Take 250 mg by mouth daily.         [name] buPROPion (WELLBUTRIN SR) 150 MG 12 hr tablet   Oral   Take 150 mg by mouth 2 (two) times daily.         . calcium carbonate (OS-CAL) 600 MG TABS   Oral   Take 600 mg by mouth 2 (two) times daily with a meal.           .  cetirizine (ZYRTEC) 10 MG tablet   Oral   Take 10 mg by mouth daily.           . Cholecalciferol (VITAMIN D) 400 UNITS capsule   Oral   Take 400 Units by mouth daily.           . Coenzyme Q10 (CO Q-10) 100 MG CAPS   Oral   Take by mouth.           . cyclobenzaprine (FLEXERIL) 10 MG tablet   Oral   Take 10 mg by mouth 3 (three) times daily as needed.           . Echinacea 125 MG CAPS   Oral   Take by mouth.           . escitalopram (LEXAPRO) 20 MG tablet   Oral   Take 20 mg by mouth daily.           [name] esomeprazole (NEXIUM) 40 MG capsule   Oral   Take 40 mg by mouth daily  before breakfast.         . Eszopiclone (ESZOPICLONE) 3 MG TABS   Oral   Take 3 mg by mouth at bedtime. Take immediately before bedtime          . gabapentin (NEURONTIN) 800 MG tablet   Oral   Take 800 mg by mouth 3 (three) times daily.           . hydrOXYzine (ATARAX/VISTARIL) 25 MG tablet   Oral   Take 25 mg by mouth 3 (three) times daily as needed.         [name] LORazepam (ATIVAN) 1 MG tablet   Oral   Take 1 mg by mouth every 8 (eight) hours.         . Magnesium 100 MG CAPS   Oral   Take 100 mg by mouth 3 (three) times daily.           . Manganese 50 MG TABS   Oral   Take by mouth.           . methocarbamol (ROBAXIN) 500 MG tablet   Oral   Take 500 mg by mouth 4 (four) times daily.           . montelukast (SINGULAIR) 10 MG tablet   Oral   Take 10 mg by mouth at bedtime.           . RABEprazole (ACIPHEX) 20 MG tablet   Oral   Take 20 mg by mouth daily.           [name] thiamine (VITAMIN B-1) 100 MG tablet   Oral   Take 100 mg by mouth daily.         . traMADol (ULTRAM) 50 MG tablet   Oral   Take 50 mg by mouth every 6 (six) hours as needed. Maximum dose= 8 tablets per day          . Vilazodone HCl (VIIBRYD) 40 MG TABS   Oral   Take by mouth daily.           BP 126/63  Pulse 83  Temp(Src) 98.3 F (36.8 C) (Oral)  Resp 18  SpO2 100%  LMP  01/20/2013  Physical Exam  Nursing note and vitals reviewed. Constitutional: She appears well-developed and well-nourished. No distress.  Laying in bed. NAD. Obese.  HENT:  Head: Normocephalic and atraumatic.  Eyes: Conjunctivae are normal. Right eye exhibits no discharge. Left eye exhibits no discharge.  Neck: Neck supple.  Cardiovascular: Normal rate, regular rhythm and normal heart sounds.  Exam reveals no gallop and no friction rub.   No murmur heard. Pulmonary/Chest: Effort normal and breath sounds normal. No respiratory distress.  Abdominal: Soft. She exhibits no distension. There is tenderness.  RUQ tenderness w/o rebound/gaurding. No distension.   Musculoskeletal: She exhibits no edema and no tenderness.  Neurological: She is alert.  Skin: Skin is warm and dry.  Psychiatric: She has a normal mood and affect. Her behavior is normal. Thought content normal.    ED Course  Procedures (including critical care time)  Labs Reviewed  COMPREHENSIVE METABOLIC PANEL - Abnormal; Notable for the following:    Sodium 133 (*)    All other components within normal limits  CBC WITH DIFFERENTIAL - Abnormal; Notable for the following:    WBC 18.3 (*)    Neutro Abs 13.3 (*)    Monocytes Absolute 1.1 (*)    All other components within normal limits  URINALYSIS, ROUTINE W REFLEX MICROSCOPIC -  Abnormal; Notable for the following:    Leukocytes, UA TRACE (*)    All other components within normal limits  URINE MICROSCOPIC-ADD ON - Abnormal; Notable for the following:    Squamous Epithelial / LPF FEW (*)    All other components within normal limits  LIPASE, BLOOD   US Abdomen Complete  01/23/2013  *RADIOLOGY REPORT*  Clinical Data:  Abdominal pain  ABDOMINAL ULTRASOUND COMPLETE  Comparison:  10/18/2008  Findings:  Gallbladder:  No gallstones, gallbladder wall thickening, or pericholecystic fluid.  Common Bile Duct:  Increased in caliber measuring up to 7 mm.  Liver: No focal mass lesion  identified. Slightly echogenic and heterogeneous echotexture.  IVC:  Appears normal.  Pancreas:  Diminished exam detail due to overlying bowel gas.  Spleen:  Within normal limits in size and echotexture.  Measures 10.3 cm.  Right kidney:  Measures 9.5 cm.  There are postsurgical changes within the upper pole of the right kidney.  There is a hypoechoic structure within the upper pole the right kidney containing focal areas of echogenicity and posterior acoustic shadowing.  This measures 1.5 x 1.1 x 1.3 cm and likely corresponds to the complicated cystic area identified on previous CT from 09/25/2008. No hydronephrosis noted.  Left kidney:  Normal in size and parenchymal echogenicity.  No evidence of mass or hydronephrosis.  Abdominal Aorta:  No aneurysm identified.  IMPRESSION:  1.  No acute findings. 2.  Mild increased caliber of the common bile duct.  No obstructing stone or mass noted. 3.  Liver parenchyma is slightly echogenic suggestive of fatty infiltration. 4.  Mildly complex cystic area arising from the upper pole of the right kidney.  This may be better assessed with follow-up, non emergent contrast-enhanced MRI or CT of the kidneys.   Original Report Authenticated By: Kerby Moors, M.D.      1. RUQ pain       MDM  42 year old female with intermittent right upper quadrant pain consistent with biliary colic. Ultrasound is fairly unremarkable though. This may be potentially do to biliary dyskinesia.  I do not feel that further emergent workup is indicated at this time though. Labs fairly unremarkable as well  aside from leukocytosis which is nonspecific. Recommended GI follow-up. Patient instructed to followup with her PCP as well. When necessary pain medication. Return precautions discussed.         Virgel Manifold, MD 01/25/13 [phone]

## 2013-02-20 ENCOUNTER — Ambulatory Visit: Payer: BC Managed Care – PPO | Admitting: *Deleted

## 2013-02-26 ENCOUNTER — Ambulatory Visit
Admission: RE | Admit: 2013-02-26 | Discharge: 2013-02-26 | Disposition: A | Discharge status: Home / Self Care | Payer: BC Managed Care – PPO | Source: Ambulatory Visit

## 2013-02-26 DIAGNOSIS — Z1231 Encounter for screening mammogram for malignant neoplasm of breast: Secondary | ICD-10-CM

## 2013-03-20 ENCOUNTER — Ambulatory Visit: Payer: BC Managed Care – PPO | Admitting: *Deleted

## 2013-03-20 ENCOUNTER — Other Ambulatory Visit: Payer: Self-pay | Admitting: Gastroenterology

## 2013-03-20 DIAGNOSIS — K838 Other specified diseases of biliary tract: Secondary | ICD-10-CM

## 2013-03-20 DIAGNOSIS — R1011 Right upper quadrant pain: Secondary | ICD-10-CM

## 2013-03-24 ENCOUNTER — Other Ambulatory Visit: Payer: BC Managed Care – PPO

## 2013-03-25 ENCOUNTER — Other Ambulatory Visit: Payer: BC Managed Care – PPO

## 2013-03-27 ENCOUNTER — Ambulatory Visit
Admission: RE | Admit: 2013-03-27 | Discharge: 2013-03-27 | Disposition: A | Discharge status: Home / Self Care | Payer: BC Managed Care – PPO | Source: Ambulatory Visit | Attending: Gastroenterology | Admitting: Gastroenterology

## 2013-03-27 DIAGNOSIS — R1011 Right upper quadrant pain: Secondary | ICD-10-CM

## 2013-03-27 DIAGNOSIS — K838 Other specified diseases of biliary tract: Secondary | ICD-10-CM

## 2013-03-27 MED ORDER — GADOXETATE DISODIUM 0.25 MMOL/ML IV SOLN
10.0000 mL | Freq: Once | INTRAVENOUS | Status: AC | PRN
  Administered 2013-03-27: 10 mL via INTRAVENOUS

## 2013-04-12 ENCOUNTER — Ambulatory Visit: Payer: BC Managed Care – PPO | Admitting: *Deleted

## 2013-05-01 ENCOUNTER — Ambulatory Visit (INDEPENDENT_AMBULATORY_CARE_PROVIDER_SITE_OTHER): Payer: BC Managed Care – PPO | Admitting: Gynecology

## 2013-05-01 ENCOUNTER — Encounter: Payer: Self-pay | Admitting: Gynecology

## 2013-05-01 VITALS — BP 124/80

## 2013-05-01 DIAGNOSIS — K760 Fatty (change of) liver, not elsewhere classified: Secondary | ICD-10-CM

## 2013-05-01 DIAGNOSIS — K7689 Other specified diseases of liver: Secondary | ICD-10-CM

## 2013-05-01 DIAGNOSIS — N938 Other specified abnormal uterine and vaginal bleeding: Secondary | ICD-10-CM

## 2013-05-01 DIAGNOSIS — N949 Unspecified condition associated with female genital organs and menstrual cycle: Secondary | ICD-10-CM

## 2013-05-01 DIAGNOSIS — R635 Abnormal weight gain: Secondary | ICD-10-CM

## 2013-05-01 NOTE — Progress Notes (Signed)
Patient presented to the office today for six-month followup as a result of her reported past history regular menses. She has a ParaGard IUD that was placing 2009. Prior to that office visit in December she had not been seen in the office for 2 years.She has past history of ETOH and has been sober for for 4 years and stopped smoking March 2013.Dr. Patrecia Pour has been treating her for her fibromyalgia and Dr. Earlean Shawl has been following her for her chronic active hepatitis. Patient had liver needle core biopsy in 2009 which confirmed the diagnosis. She  was asked to maintain a menstrual calendar and to followup with me today.  Dr. Tollie Pizza has been her primary physician who has been monitoring her hyperlipidemia and had referred her to the nutritionist. Patient complains of continued to gain weight. She is in a multitude of medications see medical list in at [place].   Patient states that she's had regular cycles but her first 2-3 days are spotting and very heavy for total days of 5 sometimes A., little bit more frequent than others. 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  Patient's lipid profile this year as follows: Results for Gloria [name], Gloria [name] (MRN [id number]) as of 05/01/2013 17:02  Ref. Range 11/30/2012 15:05  Cholesterol Latest Range: 0-200 mg/dL 205 (H)  Triglycerides Latest Range: <150 mg/dL 280 (H)  HDL Latest Range: >39 mg/dL 39 (L)  LDL (calc) Latest Range: 0-99 mg/dL 110 (H)  VLDL Latest Range: 0-40 mg/dL 56 (H)  Total CHOL/HDL Ratio No range found 5.3   She had normal liver function tests:  Results for [name], [name] (MRN [id number]) as of 05/01/2013 17:02  Ref. Range 01/23/2013 17:13  AST Latest Range: 0-37 U/L 16  ALT Latest Range: 0-35 U/L 17   The patient will be scheduled to return to the office next week for a sonohysterogram to assess her uterine cavity position of the IUD and her ovaries. I will try to speak with Dr. Earlean Shawl in reference to patient's hyperlipidemia to see if he would start her on a  low statin and continue to monitor liver function tests closely due to her history.patient was in the emergency room on March 4 for biliary colic and released home.we discussed the possibility of doing an endometrial ablation for irregular bleeding. First we need to do an endometrial biopsy, sonohysterogram. She would be to use barrier contraception although she states she has not been sexually active over 10 years. I am hesitant to start her on a type of hormonal manipulation such as the Mirena IUD that has progesterone because of its metabolism and the liver and with her history.

## 2013-05-03 ENCOUNTER — Telehealth: Payer: Self-pay | Admitting: *Deleted

## 2013-05-03 ENCOUNTER — Other Ambulatory Visit: Payer: Self-pay | Admitting: Gynecology

## 2013-05-03 DIAGNOSIS — N938 Other specified abnormal uterine and vaginal bleeding: Secondary | ICD-10-CM

## 2013-05-03 NOTE — Telephone Encounter (Signed)
Pt was informed with the below note, the lipid profile was faxed to her PCP already. Pt will follow up as directed.

## 2013-05-03 NOTE — Telephone Encounter (Signed)
Left message on pt to call.

## 2013-05-03 NOTE — Telephone Encounter (Signed)
Message copied by Thamas Jaegers on Thu May 03, 2013  9:42 AM ------      Message from: Terrance Mass      Created: Wed May 02, 2013  4:06 PM       Anderson Malta, please contact patient and let her know that I spoke with Dr. Earlean Shawl today and he felt that there was no contraindication for her to be placed on a low dose statin to help her with her hyperlipidemia. He did recommend that her liver function tests be checked every 3 months for the first year. Please make sure that she makes an appointment to see him so that he can start her on medication. Please send Dr. Tollie Pizza a copy of my last encounter note and her last lipid profile so that he can have it when she goes to see him. ------

## 2013-05-07 ENCOUNTER — Telehealth: Payer: Self-pay | Admitting: *Deleted

## 2013-05-07 NOTE — Telephone Encounter (Signed)
Pt called to follow up from telephone conversation 05/03/13. Pt said once she called Dr.Medoff office they informed her that Dr.Medoff said you were going to prescribe medication and blood work as well. Please advise

## 2013-05-07 NOTE — Telephone Encounter (Signed)
Her primary physician should be treating her for her hyperlipidemiaand monitoring her liver function tests. Please find out who her primary physician is because I do believe that I send him a note stating that Dr. Catalina Antigua off had stated that she could be put on a low-dose statin as long as her liver function tests are monitored at least 3 times a year.

## 2013-05-08 NOTE — Telephone Encounter (Signed)
Pt will contact her PCP and I sent copy of labs to PCP office.

## 2013-05-16 ENCOUNTER — Ambulatory Visit (INDEPENDENT_AMBULATORY_CARE_PROVIDER_SITE_OTHER): Payer: BC Managed Care – PPO

## 2013-05-16 ENCOUNTER — Ambulatory Visit (INDEPENDENT_AMBULATORY_CARE_PROVIDER_SITE_OTHER): Payer: BC Managed Care – PPO | Admitting: Gynecology

## 2013-05-16 DIAGNOSIS — D259 Leiomyoma of uterus, unspecified: Secondary | ICD-10-CM

## 2013-05-16 DIAGNOSIS — N83202 Unspecified ovarian cyst, left side: Secondary | ICD-10-CM | POA: Insufficient documentation

## 2013-05-16 DIAGNOSIS — R9389 Abnormal findings on diagnostic imaging of other specified body structures: Secondary | ICD-10-CM

## 2013-05-16 DIAGNOSIS — N83209 Unspecified ovarian cyst, unspecified side: Secondary | ICD-10-CM

## 2013-05-16 DIAGNOSIS — N839 Noninflammatory disorder of ovary, fallopian tube and broad ligament, unspecified: Secondary | ICD-10-CM

## 2013-05-16 DIAGNOSIS — N920 Excessive and frequent menstruation with regular cycle: Secondary | ICD-10-CM

## 2013-05-16 DIAGNOSIS — N938 Other specified abnormal uterine and vaginal bleeding: Secondary | ICD-10-CM

## 2013-05-16 DIAGNOSIS — N838 Other noninflammatory disorders of ovary, fallopian tube and broad ligament: Secondary | ICD-10-CM

## 2013-05-16 DIAGNOSIS — D252 Subserosal leiomyoma of uterus: Secondary | ICD-10-CM

## 2013-05-16 DIAGNOSIS — N949 Unspecified condition associated with female genital organs and menstrual cycle: Secondary | ICD-10-CM

## 2013-05-16 DIAGNOSIS — N8300 Follicular cyst of ovary, unspecified side: Secondary | ICD-10-CM

## 2013-05-16 LAB — CA 125: CA 125: 32.8 U/mL — ABNORMAL HIGH (ref 0.0–30.2)

## 2013-05-16 LAB — CBC WITH DIFFERENTIAL/PLATELET
Hemoglobin: 10.8 g/dL — ABNORMAL LOW (ref 12.0–15.0)
Lymphs Abs: 2.4 10*3/uL (ref 0.7–4.0)
MCH: 25.4 pg — ABNORMAL LOW (ref 26.0–34.0)
Monocytes Relative: 8 % (ref 3–12)
Neutro Abs: 6 10*3/uL (ref 1.7–7.7)
Neutrophils Relative %: 64 % (ref 43–77)
RBC: 4.25 MIL/uL (ref 3.87–5.11)

## 2013-05-16 NOTE — Patient Instructions (Addendum)
Ovarian Cyst The ovaries are small organs that are on each side of the uterus. The ovaries are the organs that produce the female hormones, estrogen and progesterone. An ovarian cyst is a sac filled with fluid that can vary in its size. It is normal for a small cyst to form in women who are in the childbearing age and who have menstrual periods. This type of cyst is called a follicle cyst that becomes an ovulation cyst (corpus luteum cyst) after it produces the women's egg. It later goes away on its own if the woman does not become pregnant. There are other kinds of ovarian cysts that may cause problems and may need to be treated. The most serious problem is a cyst with cancer. It should be noted that menopausal women who have an ovarian cyst are at a higher risk of it being a cancer cyst. They should be evaluated very quickly, thoroughly and followed closely. This is especially true in menopausal women because of the high rate of ovarian cancer in women in menopause. CAUSES AND TYPES OF OVARIAN CYSTS:  FUNCTIONAL CYST: The follicle/corpus luteum cyst is a functional cyst that occurs every month during ovulation with the menstrual cycle. They go away with the next menstrual cycle if the woman does not get pregnant. Usually, there are no symptoms with a functional cyst.  ENDOMETRIOMA CYST: This cyst develops from the lining of the uterus tissue. This cyst gets in or on the ovary. It grows every month from the bleeding during the menstrual period. It is also called a "chocolate cyst" because it becomes filled with blood that turns brown. This cyst can cause pain in the lower abdomen during intercourse and with your menstrual period.  CYSTADENOMA CYST: This cyst develops from the cells on the outside of the ovary. They usually are not cancerous. They can get very big and cause lower abdomen pain and pain with intercourse. This type of cyst can twist on itself, cut off its blood supply and cause severe pain. It  also can easily rupture and cause a lot of pain.  DERMOID CYST: This type of cyst is sometimes found in both ovaries. They are found to have different kinds of body tissue in the cyst. The tissue includes skin, teeth, hair, and/or cartilage. They usually do not have symptoms unless they get very big. Dermoid cysts are rarely cancerous.  POLYCYSTIC OVARY: This is a rare condition with hormone problems that produces many small cysts on both ovaries. The cysts are follicle-like cysts that never produce an egg and become a corpus luteum. It can cause an increase in body weight, infertility, acne, increase in body and facial hair and lack of menstrual periods or rare menstrual periods. Many women with this problem develop type 2 diabetes. The exact cause of this problem is unknown. A polycystic ovary is rarely cancerous.  THECA LUTEIN CYST: Occurs when too much hormone (human chorionic gonadotropin) is produced and over-stimulates the ovaries to produce an egg. They are frequently seen when doctors stimulate the ovaries for invitro-fertilization (test tube babies).  LUTEOMA CYST: This cyst is seen during pregnancy. Rarely it can cause an obstruction to the birth canal during labor and delivery. They usually go away after delivery. SYMPTOMS   Pelvic pain or pressure.  Pain during sexual intercourse.  Increasing girth (swelling) of the abdomen.  Abnormal menstrual periods.  Increasing pain with menstrual periods.  You stop having menstrual periods and you are not pregnant. DIAGNOSIS  The diagnosis can  be made during:  Routine or annual pelvic examination (common).  Ultrasound.  X-ray of the pelvis.  CT Scan.  MRI.  Blood tests. TREATMENT   Treatment may only be to follow the cyst monthly for 2 to 3 months with your caregiver. Many go away on their own, especially functional cysts.  May be aspirated (drained) with a long needle with ultrasound, or by laparoscopy (inserting a tube into  the pelvis through a small incision).  The whole cyst can be removed by laparoscopy.  Sometimes the cyst may need to be removed through an incision in the lower abdomen.  Hormone treatment is sometimes used to help dissolve certain cysts.  Birth control pills are sometimes used to help dissolve certain cysts. HOME CARE INSTRUCTIONS  Follow your caregiver's advice regarding:  Medicine.  Follow up visits to evaluate and treat the cyst.  You may need to come back or make an appointment with another caregiver, to find the exact cause of your cyst, if your caregiver is not a gynecologist.  Get your yearly and recommended pelvic examinations and Pap tests.  Let your caregiver know if you have had an ovarian cyst in the past. SEEK MEDICAL CARE IF:   Your periods are late, irregular, they stop, or are painful.  Your stomach (abdomen) or pelvic pain does not go away.  Your stomach becomes larger or swollen.  You have pressure on your bladder or trouble emptying your bladder completely.  You have painful sexual intercourse.  You have feelings of fullness, pressure, or discomfort in your stomach.  You lose weight for no apparent reason.  You feel generally ill.  You become constipated.  You lose your appetite.  You develop acne.  You have an increase in body and facial hair.  You are gaining weight, without changing your exercise and eating habits.  You think you are pregnant. SEEK IMMEDIATE MEDICAL CARE IF:   You have increasing abdominal pain.  You feel sick to your stomach (nausea) and/or vomit.  You develop a fever that comes on suddenly.  You develop abdominal pain during a bowel movement.  Your menstrual periods become heavier than usual. Document Released: 11/08/2005 Document Revised: 01/31/2012 Document Reviewed: 09/11/2009 [facility] Patient Information 2014 [place]. Hysterectomy Information  A hysterectomy is a procedure where your uterus is  surgically removed. It will no longer be possible to have menstrual periods or to become pregnant. The tubes and ovaries can be removed (bilateral salpingo-oopherectomy) during this surgery as well.  REASONS FOR A HYSTERECTOMY  Persistent, abnormal bleeding.  Lasting (chronic) pelvic pain or infection.  The lining of the uterus (endometrium) starts growing outside the uterus (endometriosis).  The endometrium starts growing in the muscle of the uterus (adenomyosis).  The uterus falls down into the vagina (pelvic organ prolapse).  Symptomatic uterine fibroids.  Precancerous cells.  Cervical cancer or uterine cancer. TYPES OF HYSTERECTOMIES  Supracervical hysterectomy. This type removes the top part of the uterus, but not the cervix.  Total hysterectomy. This type removes the uterus and cervix.  Radical hysterectomy. This type removes the uterus, cervix, and the fibrous tissue that holds the uterus in place in the pelvis (parametrium). WAYS A HYSTERECTOMY CAN BE PERFORMED  Abdominal hysterectomy. A large surgical cut (incision) is made in the abdomen. The uterus is removed through this incision.  Vaginal hysterectomy. An incision is made in the vagina. The uterus is removed through this incision. There are no abdominal incisions.  Conventional laparoscopic hysterectomy. A thin, lighted  tube with a camera (laparoscope) is inserted into 3 or 4 small incisions in the abdomen. The uterus is cut into small pieces. The small pieces are removed through the incisions, or they are removed through the vagina.  Laparoscopic assisted vaginal hysterectomy (LAVH). Three or four small incisions are made in the abdomen. Part of the surgery is performed laparoscopically and part vaginally. The uterus is removed through the vagina.  Robot-assisted laparoscopic hysterectomy. A laparoscope is inserted into 3 or 4 small incisions in the abdomen. A computer-controlled device is used to give the surgeon a  3D image. This allows for more precise movements of surgical instruments. The uterus is cut into small pieces and removed through the incisions or removed through the vagina. RISKS OF HYSTERECTOMY   Bleeding and risk of blood transfusion. Tell your caregiver if you do not want to receive any blood products.  Blood clots in the legs or lung.  Infection.  Injury to surrounding organs.  Anesthesia problems or side effects.  Conversion to an abdominal hysterectomy. WHAT TO EXPECT AFTER A HYSTERECTOMY  You will be given pain medicine.  You will need to have someone with you for the first 3 to 5 days after you go home.  You will need to follow up with your surgeon in 2 to 4 weeks after surgery to evaluate your progress.  You may have early menopause symptoms like hot flashes, night sweats, and insomnia.  If you had a hysterectomy for a problem that was not a cancer or a condition that could lead to cancer, then you no longer need Pap tests. However, even if you no longer need a Pap test, a regular exam is a good idea to make sure no other problems are starting. Document Released: 05/04/2001 Document Revised: 01/31/2012 Document Reviewed: 06/19/2011 [facility] Patient Information 2014 [place], [place].

## 2013-05-16 NOTE — Progress Notes (Addendum)
42 year old gravida 1 para 1 who was seen in the office on June 10 as a result of her irregular heavy menses. Patient with Paraguard  IUD that was placed in 2009. Marland KitchenShe has past history of ETOH abuse  and has been sober for for 4 years and stopped smoking March 2013.Dr. Patrecia Pour has been treating her for her fibromyalgia and Dr. Earlean Shawl has been following her for her chronic active hepatitis. Patient had liver needle core biopsy in 2009 which confirmed the diagnosis.  Dr. Tollie Pizza has been her primary physician who has been monitoring her hyperlipidemia and had referred her to the nutritionist. Patient complains of continued to gain weight. She is in a multitude of medications see medical list in at [place].   Patient's recent liver function tests were normal but her total cholesterol, triglycerides, LDL, and the LDL were all elevated. Her primary physician will be placing her on a low statin with close monitorization of liver function tests as recommended by her gastroenterologist Dr. Earlean Shawl.  Patient presented to the office today for a sonohysterogram and endometrial biopsy.  Ultrasound: Uterus measured 8.0 x 5.9 x 5.5 cm endometrial stripe 14.3 mm. A left subserosal fibroid measuring 26 x 26 x 26 mm was noted unchanged from 2011.  Uterus  was seen in normal position. Right ovary was normal. Left ovary solid mass 19 x 18 mm heterogeneous echo pattern with positive color flow in the periphery. There was no fluid in the cul-de-sac. Sonohysterogram after injection of sterile saline demonstrated the endometrial cavity thickened but no polyps or myomas noted. Anterior wall measured 8 mm and posterior wall measured 6 mm.  After the cervix had been cleansed once again with Betadine solution a sterile Pipelle was introduced into the uterine cavity and an endometrial biopsy was obtained and tissue submitted for histological evaluation  Assessment/plan: Due to the fact the patient has chronic active hepatitis any  form of hormone manipulation is contraindicated because of metabolism  In  the liver. We discussed different treatment options for her menorrhagia and the safest route would be proceeding with a transvaginal hysterectomy and possible left ovarian cystectomy with ovarian conservation. A CA 125 was obtained today with this limitation discussed with the patient. We'll check her CBC as well. We'll plan on scheduling her surgery for some time the end of next month. Will wait to get medical clearance from her primary physician Dr. Tollie Pizza. I will see the patient the week before surgery for preop exam and consultation. Literature information on hysterectomy was provided.

## 2013-05-21 ENCOUNTER — Telehealth: Payer: Self-pay

## 2013-05-21 NOTE — Telephone Encounter (Signed)
Patient called to ask if Dr. Moshe Salisbury had planned to remove ovaries with hysterectomy.  I told her his note said he planned ovarian conservation and did not plan to remove ovaries.

## 2013-05-23 ENCOUNTER — Other Ambulatory Visit: Payer: Self-pay | Admitting: Gynecology

## 2013-05-23 ENCOUNTER — Telehealth: Payer: Self-pay

## 2013-05-23 MED ORDER — IBUPROFEN 800 MG PO TABS: 800.0000 mg | ORAL_TABLET | Freq: Three times a day (TID) | ORAL | Status: DC | PRN

## 2013-05-23 NOTE — Telephone Encounter (Signed)
Patient called. Said she had bleeding heavy since endo biopsy bleeding 05/16/13. Had been saturating 2-3 pads a day.  Now it has tapered to light bleeding and it is dark.  Patient is complaining of that last night she had menstrual-like cramps that woke her during the night.  She is still having really heavy cramps today.  She complains of "feeling exhausted" which she attributes to the bleeding and to having up during the night with cramps.  She has not gone to work today and is still in bed. Mentioned she might need a note for work.  What to rec?

## 2013-05-23 NOTE — Telephone Encounter (Signed)
Dr. Moshe Salisbury- Please note that her bleeding from the biopsy has just about stopped. It is light and very dark now.  You had also included in office note "Assessment/plan: Due to the fact the patient has chronic active hepatitis any form of hormone manipulation is contraindicated because of metabolism In the liver. We discussed different treatment options for her menorrhagia and the safest route would be proceeding with a transvaginal hysterectomy".  Her hysterectomy is scheduled 06/06/13.  I think the cramping was her concern and why she was having menstrual cramps when she really wasn't having much bleeding.  She will appreciate the note to be out of work today.  Do you want to give her Rx for cramping?

## 2013-05-23 NOTE — Telephone Encounter (Signed)
Call in  Megace 80m BID for 7 days to help stop bleeding. A note for  work to be out for a few days will be fine.

## 2013-05-23 NOTE — Telephone Encounter (Signed)
Called patient and got her voice mail . Left detailed message that I was calling the Motrin in to her pharmacy to help calm the uterine cramping and that she should be sure to stop taking it 3-5 days prior to surgery.  I told her Dr. Moshe Salisbury fine with giving her a note for missed work today and she should let me know how she would like to get that.

## 2013-05-23 NOTE — Telephone Encounter (Signed)
Motrin 800 mg TID PRN #30. Needs to stop it 3 days before surgery

## 2013-05-24 ENCOUNTER — Ambulatory Visit: Payer: BC Managed Care – PPO | Admitting: *Deleted

## 2013-05-24 NOTE — Telephone Encounter (Signed)
Patient called me this morning. She did get my message yesterday and picked up her Rx.  She provided me with fax # at work and I sent letter to excuse her absence from work yesterday. Copy is in Epic chart.

## 2013-05-31 ENCOUNTER — Encounter (HOSPITAL_COMMUNITY): Payer: Self-pay | Admitting: Pharmacy Technician

## 2013-06-04 ENCOUNTER — Encounter: Payer: Self-pay | Admitting: Gynecology

## 2013-06-04 ENCOUNTER — Ambulatory Visit (INDEPENDENT_AMBULATORY_CARE_PROVIDER_SITE_OTHER): Payer: BC Managed Care – PPO | Admitting: Gynecology

## 2013-06-04 VITALS — BP 126/72

## 2013-06-04 DIAGNOSIS — D649 Anemia, unspecified: Secondary | ICD-10-CM

## 2013-06-04 DIAGNOSIS — Z01818 Encounter for other preprocedural examination: Secondary | ICD-10-CM

## 2013-06-04 DIAGNOSIS — D259 Leiomyoma of uterus, unspecified: Secondary | ICD-10-CM | POA: Insufficient documentation

## 2013-06-04 DIAGNOSIS — N949 Unspecified condition associated with female genital organs and menstrual cycle: Secondary | ICD-10-CM

## 2013-06-04 DIAGNOSIS — N83202 Unspecified ovarian cyst, left side: Secondary | ICD-10-CM

## 2013-06-04 DIAGNOSIS — N938 Other specified abnormal uterine and vaginal bleeding: Secondary | ICD-10-CM

## 2013-06-04 DIAGNOSIS — N83209 Unspecified ovarian cyst, unspecified side: Secondary | ICD-10-CM

## 2013-06-04 NOTE — Progress Notes (Signed)
Gloria [name] is an 42 y.o. female. For preop consultation for her plan total vaginal hysterectomy with possible left ovarian cystectomy secondary to symptomatic leiomyomatous uteri contributing to anemia and dysfunctional  uterine bleeding  . Patient is a 42 year old gravida 1 para 1 who was seen in the office on June 10 as a result of her irregular heavy menses. Patient with Paraguard IUD that was placed in 2009. Marland KitchenShe has past history of ETOH abuse and has been sober for for 4 years and stopped smoking March 2013.Dr. Patrecia Pour has been treating her for her fibromyalgia and Dr. Earlean Shawl has been following her for her chronic active hepatitis. Patient had liver needle core biopsy in 2009 which confirmed the diagnosis.  Dr. Tollie Pizza has been her primary physician who has been monitoring her hyperlipidemia. Patient's recent liver function tests were normal but her total cholesterol, triglycerides, LDL, and the LDL were all elevated. Her primary physician will be placing her on a low statin with close monitorization of liver function tests as recommended by her gastroenterologist Dr. Earlean Shawl.  Patient was seen in the office on 05/23/2013 for sonohysterogram and endometrial biopsy with the following results: Ultrasound:  Uterus measured 8.0 x 5.9 x 5.5 cm endometrial stripe 14.3 mm. A left subserosal fibroid measuring 26 x 26 x 26 mm was noted unchanged from 2011.  Uterus was seen in normal position. Right ovary was normal. Left ovary solid mass 19 x 18 mm heterogeneous echo pattern with positive color flow in the periphery. There was no fluid in the cul-de-sac. Sonohysterogram after injection of sterile saline demonstrated the endometrial cavity thickened but no polyps or myomas noted. Anterior wall measured 8 mm and posterior wall measured 6 mm.  Endometrial biopsy results from that day as follows: Endometrium, biopsy, uterus - SECRETORY ENDOMETRIUM. NO HYPERPLASIA OR CARCINOMA.   Pertinent Gynecological  History: Menses: Menorrhagia Bleeding: dysfunctional uterine bleeding Contraception: IUD DES exposure: denies Blood transfusions: Not certain Sexually transmitted diseases: no past history Previous GYN Procedures: Vaginal delivery  Last mammogram: normal Date: 2014 Last pap: normal Date: 2011 OB History: G1, P 1   Menstrual History: Menarche age: 26  No LMP recorded.    Past Medical History  Diagnosis Date  . Smoker   . History of ETOH abuse   . Elevated LFTs   . Fibromyalgia   . Bulging disc     X 2  . Arthritis   . Hyperlipidemia     Past Surgical History  Procedure Laterality Date  . Intrauterine device insertion  08/09/2008    PARAGUARD  . Neck surgery      Family History  Problem Relation Age of Onset  . Breast cancer Mother   . Breast cancer Maternal Aunt   . Breast cancer Paternal Grandmother     COLON  . Cancer Paternal Grandfather     THROAT .[name]    Social History:  reports that she quit smoking about 16 months ago. She has never used smokeless tobacco. She reports that she does not drink alcohol or use illicit drugs.  Allergies:  Allergies  Allergen Reactions  . Benadryl (Diphenhydramine Hcl) Hives     (Not in a hospital admission)  REVIEW OF SYSTEMS: A ROS was performed and pertinent positives and negatives are included in the history.  GENERAL: No fevers or chills. HEENT: No change in vision, no earache, sore throat or sinus congestion. NECK: No pain or stiffness. CARDIOVASCULAR: No chest pain or pressure. No palpitations. PULMONARY: No shortness of breath, cough  or wheeze. GASTROINTESTINAL: No abdominal pain, nausea, vomiting or diarrhea, melena or bright red blood per rectum. GENITOURINARY: No urinary frequency, urgency, hesitancy or dysuria. MUSCULOSKELETAL: No joint or muscle pain, no back pain, no recent trauma. DERMATOLOGIC: No rash, no itching, no lesions. ENDOCRINE: No polyuria, polydipsia, no heat or cold intolerance. No recent change in  weight. HEMATOLOGICAL: No anemia or easy bruising or bleeding. NEUROLOGIC: No headache, seizures, numbness, tingling or weakness. PSYCHIATRIC: No depression, no loss of interest in normal activity or change in sleep pattern.     Blood pressure 126/72.  Physical Exam:  HEENT:unremarkable Neck:Supple, midline, no thyroid megaly, no carotid bruits Lungs:  Clear to auscultation no rhonchi's or wheezes Heart:Regular rate and rhythm, no murmurs or gallops Breast Exam:examined December 2013 normal Abdomen:soft nontender no rebound guarding Pelvic:BUSwithin normal limits Vagina:no lesions or discharge Cervix:no lesion or discharge Uterus:anteverted slightly irregular at the fundus patient's left side secondary to fibroid Adnexa:same as above Extremities: No cords, no edema Rectal:unremarkable   Assessment/plan: Due to the fact the patient has chronic active hepatitis any form of hormone manipulation is contraindicated because of metabolism In the liver. We discussed different treatment options for her menorrhagia and the safest route would be proceeding with a transvaginal hysterectomy and possible left ovarian cystectomy with ovarian conservation. A CA 125 Recently done had a value of 32 upper limits of normal being 30 but this may be attributed to her fibroid uterus. Patient is currently taking 2 arm tablets daily because her last hemoglobin was 10.8.she has been cleared by her primary physician Dr. Tollie Pizza. The following risk for surgery were discussed as follows:                        Patient was counseled as to the risk of surgery to include the following:  1. Infection (prohylactic antibiotics will be administered)  2. DVT/Pulmonary Embolism (prophylactic pneumo compression stockings will be used)  3.Trauma to internal organs requiring additional surgical procedure to repair any injury to     Internal organs requiring perhaps additional hospitalization days.  4.Hemmorhage requiring  transfusion and blood products which carry risks such as  anaphylactic reaction, hepatitis and AIDS  Patient had received literature information on the procedure scheduled and all her questions were answered and fully accepts all risk.  [facility] HMD3:42 PMTD@  Gloria [name] 06/04/2013, 3:33 PM In a

## 2013-06-05 ENCOUNTER — Encounter (HOSPITAL_COMMUNITY): Payer: Self-pay

## 2013-06-05 ENCOUNTER — Encounter (HOSPITAL_COMMUNITY)
Admission: RE | Admit: 2013-06-05 | Discharge: 2013-06-05 | Disposition: A | Discharge status: Home / Self Care | Payer: BC Managed Care – PPO | Source: Ambulatory Visit | Attending: Gynecology | Admitting: Gynecology

## 2013-06-05 HISTORY — DX: Unspecified osteoarthritis, unspecified site: M19.90

## 2013-06-05 LAB — COMPREHENSIVE METABOLIC PANEL
ALT: 19 U/L (ref 0–35)
AST: 17 U/L (ref 0–37)
Alkaline Phosphatase: 78 U/L (ref 39–117)
CO2: 28 mEq/L (ref 19–32)
Chloride: 98 mEq/L (ref 96–112)
GFR calc non Af Amer: >90 mL/min (ref >90)
Potassium: 4.1 mEq/L (ref 3.5–5.1)
Sodium: 135 mEq/L (ref 135–145)
Total Bilirubin: 0.4 mg/dL (ref 0.3–1.2)

## 2013-06-05 LAB — CBC
MCV: 81.3 fL (ref 78.0–100.0)
Platelets: 317 10*3/uL (ref 150–400)
RBC: 4.59 MIL/uL (ref 3.87–5.11)
WBC: 14.1 10*3/uL — ABNORMAL HIGH (ref 4.0–10.5)

## 2013-06-05 LAB — APTT: aPTT: 34 seconds (ref 24–37)

## 2013-06-05 MED ORDER — DEXTROSE 5 % IV SOLN
2.0000 g | INTRAVENOUS | Status: AC
  Administered 2013-06-06: 2 g via INTRAVENOUS
  Filled 2013-06-05: qty 2

## 2013-06-05 NOTE — Patient Instructions (Addendum)
[place]  06/05/2013   Your procedure is scheduled on:  06/06/13  Enter through the Main Entrance of [facility] at [place] up the phone at the desk and dial [phone].   Call this number if you have problems the morning of surgery: [phone]   Remember:   Do not eat food:After Midnight.  Do not drink clear liquids: 4 Hours before arrival.  Take these medicines the morning of surgery with A SIP OF WATER: Nexium, magnesium   Do not wear jewelry, make-up or nail polish.  Do not wear lotions, powders, or perfumes. You may wear deodorant.  Do not shave 48 hours prior to surgery.  Do not bring valuables to the hospital.  [facility] is not responsible                  for any belongings or valuables brought to the hospital.  Contacts, dentures or bridgework may not be worn into surgery.  Leave suitcase in the car. After surgery it may be brought to your room.  For patients admitted to the hospital, checkout time is 11:00 AM the day of                discharge.   Patients discharged the day of surgery will not be allowed to drive                   home.  Name and phone number of your driver: NA  Special Instructions: Shower using CHG 2 nights before surgery and the night before surgery.  If you shower the day of surgery use CHG.  Use special wash - you have one bottle of CHG for all showers.  You should use approximately 1/3 of the bottle for each shower.   Please read over the following fact sheets that you were given: Surgical Site Infection Prevention

## 2013-06-06 ENCOUNTER — Inpatient Hospital Stay (HOSPITAL_COMMUNITY)
Admission: RE | Admit: 2013-06-06 | Discharge: 2013-06-08 | DRG: 573 | Disposition: A | Discharge status: Home / Self Care | Payer: BC Managed Care – PPO | Source: Ambulatory Visit | Attending: Gynecology | Admitting: Gynecology

## 2013-06-06 ENCOUNTER — Ambulatory Visit (HOSPITAL_COMMUNITY): Payer: BC Managed Care – PPO | Admitting: Anesthesiology

## 2013-06-06 ENCOUNTER — Encounter (HOSPITAL_COMMUNITY)
Admission: RE | Disposition: A | Discharge status: Home / Self Care | Payer: Self-pay | Source: Ambulatory Visit | Attending: Gynecology

## 2013-06-06 ENCOUNTER — Encounter (HOSPITAL_COMMUNITY): Payer: Self-pay | Admitting: Anesthesiology

## 2013-06-06 DIAGNOSIS — Y9210 Unspecified residential institution as the place of occurrence of the external cause: Secondary | ICD-10-CM | POA: Diagnosis not present

## 2013-06-06 DIAGNOSIS — Z9889 Other specified postprocedural states: Secondary | ICD-10-CM

## 2013-06-06 DIAGNOSIS — IMO0002 Reserved for concepts with insufficient information to code with codable children: Secondary | ICD-10-CM | POA: Diagnosis not present

## 2013-06-06 DIAGNOSIS — N920 Excessive and frequent menstruation with regular cycle: Principal | ICD-10-CM | POA: Diagnosis present

## 2013-06-06 DIAGNOSIS — N926 Irregular menstruation, unspecified: Secondary | ICD-10-CM | POA: Diagnosis present

## 2013-06-06 DIAGNOSIS — N946 Dysmenorrhea, unspecified: Secondary | ICD-10-CM | POA: Diagnosis present

## 2013-06-06 DIAGNOSIS — D649 Anemia, unspecified: Secondary | ICD-10-CM | POA: Diagnosis present

## 2013-06-06 DIAGNOSIS — Y658 Other specified misadventures during surgical and medical care: Secondary | ICD-10-CM | POA: Diagnosis not present

## 2013-06-06 DIAGNOSIS — K738 Other chronic hepatitis, not elsewhere classified: Secondary | ICD-10-CM | POA: Diagnosis present

## 2013-06-06 DIAGNOSIS — E785 Hyperlipidemia, unspecified: Secondary | ICD-10-CM | POA: Diagnosis present

## 2013-06-06 DIAGNOSIS — N949 Unspecified condition associated with female genital organs and menstrual cycle: Secondary | ICD-10-CM | POA: Diagnosis present

## 2013-06-06 DIAGNOSIS — N83209 Unspecified ovarian cyst, unspecified side: Secondary | ICD-10-CM | POA: Diagnosis present

## 2013-06-06 DIAGNOSIS — N938 Other specified abnormal uterine and vaginal bleeding: Secondary | ICD-10-CM | POA: Diagnosis present

## 2013-06-06 DIAGNOSIS — D252 Subserosal leiomyoma of uterus: Secondary | ICD-10-CM | POA: Diagnosis present

## 2013-06-06 DIAGNOSIS — IMO0001 Reserved for inherently not codable concepts without codable children: Secondary | ICD-10-CM | POA: Diagnosis present

## 2013-06-06 DIAGNOSIS — D251 Intramural leiomyoma of uterus: Secondary | ICD-10-CM | POA: Diagnosis present

## 2013-06-06 DIAGNOSIS — R9389 Abnormal findings on diagnostic imaging of other specified body structures: Secondary | ICD-10-CM | POA: Diagnosis present

## 2013-06-06 HISTORY — PX: VAGINAL HYSTERECTOMY: SHX2639

## 2013-06-06 HISTORY — PX: CYSTOSCOPY: SHX5120

## 2013-06-06 HISTORY — PX: LAPAROTOMY: SHX154

## 2013-06-06 HISTORY — PX: LAPAROSCOPY: SHX197

## 2013-06-06 LAB — PROTIME-INR: INR: 1.02 (ref 0.00–1.49)

## 2013-06-06 LAB — CBC
HCT: 31.6 % — ABNORMAL LOW (ref 36.0–46.0)
Hemoglobin: 10 g/dL — ABNORMAL LOW (ref 12.0–15.0)
MCH: 25.7 pg — ABNORMAL LOW (ref 26.0–34.0)
MCHC: 31.6 g/dL (ref 30.0–36.0)
MCV: 81.2 fL (ref 78.0–100.0)
RDW: 14.5 % (ref 11.5–15.5)

## 2013-06-06 LAB — ABO/RH: ABO/RH(D): O POS

## 2013-06-06 LAB — APTT: aPTT: 32 seconds (ref 24–37)

## 2013-06-06 SURGERY — HYSTERECTOMY, VAGINAL
Anesthesia: General

## 2013-06-06 MED ORDER — SODIUM CHLORIDE 0.9 % IJ SOLN: 9.0000 mL | INTRAMUSCULAR | Status: DC | PRN

## 2013-06-06 MED ORDER — ROCURONIUM BROMIDE 100 MG/10ML IV SOLN
INTRAVENOUS | Status: DC | PRN
  Administered 2013-06-06 (×2): 10 mg via INTRAVENOUS

## 2013-06-06 MED ORDER — DIPHENHYDRAMINE HCL 50 MG/ML IJ SOLN: 12.5000 mg | Freq: Four times a day (QID) | INTRAMUSCULAR | Status: DC | PRN

## 2013-06-06 MED ORDER — GLYCOPYRROLATE 0.2 MG/ML IJ SOLN
INTRAMUSCULAR | Status: AC
  Filled 2013-06-06: qty 1

## 2013-06-06 MED ORDER — ONDANSETRON HCL 4 MG/2ML IJ SOLN
INTRAMUSCULAR | Status: AC
  Filled 2013-06-06: qty 2

## 2013-06-06 MED ORDER — HYDROMORPHONE HCL PF 1 MG/ML IJ SOLN
INTRAMUSCULAR | Status: AC
  Filled 2013-06-06: qty 1

## 2013-06-06 MED ORDER — FENTANYL CITRATE 0.05 MG/ML IJ SOLN
INTRAMUSCULAR | Status: AC
  Administered 2013-06-06: 50 ug via INTRAVENOUS
  Filled 2013-06-06: qty 2

## 2013-06-06 MED ORDER — LORATADINE 10 MG PO TABS
10.0000 mg | ORAL_TABLET | Freq: Every day | ORAL | Status: DC | PRN
  Filled 2013-06-06: qty 1

## 2013-06-06 MED ORDER — ONDANSETRON HCL 4 MG/2ML IJ SOLN: 4.0000 mg | Freq: Four times a day (QID) | INTRAMUSCULAR | Status: DC | PRN

## 2013-06-06 MED ORDER — GABAPENTIN 400 MG PO CAPS
800.0000 mg | ORAL_CAPSULE | Freq: Three times a day (TID) | ORAL | Status: DC
  Administered 2013-06-06 – 2013-06-08 (×5): 800 mg via ORAL
  Filled 2013-06-06 (×5): qty 2

## 2013-06-06 MED ORDER — NEOSTIGMINE METHYLSULFATE 1 MG/ML IJ SOLN
INTRAMUSCULAR | Status: AC
  Filled 2013-06-06: qty 1

## 2013-06-06 MED ORDER — LACTATED RINGERS IV SOLN
INTRAVENOUS | Status: DC
  Administered 2013-06-06 – 2013-06-07 (×2): via INTRAVENOUS

## 2013-06-06 MED ORDER — SUCCINYLCHOLINE CHLORIDE 20 MG/ML IJ SOLN
INTRAMUSCULAR | Status: AC
  Filled 2013-06-06: qty 10

## 2013-06-06 MED ORDER — FENTANYL CITRATE 0.05 MG/ML IJ SOLN
INTRAMUSCULAR | Status: AC
  Filled 2013-06-06: qty 5

## 2013-06-06 MED ORDER — GLYCOPYRROLATE 0.2 MG/ML IJ SOLN
INTRAMUSCULAR | Status: DC | PRN
  Administered 2013-06-06: 0.2 mg via INTRAVENOUS

## 2013-06-06 MED ORDER — OXYCODONE-ACETAMINOPHEN 5-325 MG PO TABS
1.0000 | ORAL_TABLET | Freq: Four times a day (QID) | ORAL | Status: DC | PRN
  Administered 2013-06-07 – 2013-06-08 (×5): 2 via ORAL
  Filled 2013-06-06 (×5): qty 2

## 2013-06-06 MED ORDER — DEXTROSE 5 % IV SOLN
1.0000 g | Freq: Once | INTRAVENOUS | Status: AC
  Administered 2013-06-07: 1 g via INTRAVENOUS
  Filled 2013-06-06: qty 1

## 2013-06-06 MED ORDER — ONDANSETRON HCL 4 MG/2ML IJ SOLN
INTRAMUSCULAR | Status: DC | PRN
  Administered 2013-06-06: 4 mg via INTRAVENOUS

## 2013-06-06 MED ORDER — LIDOCAINE-EPINEPHRINE 1 %-1:100000 IJ SOLN
INTRAMUSCULAR | Status: DC | PRN
  Administered 2013-06-06: 20 mL

## 2013-06-06 MED ORDER — FENTANYL CITRATE 0.05 MG/ML IJ SOLN
INTRAMUSCULAR | Status: AC
  Filled 2013-06-06: qty 2

## 2013-06-06 MED ORDER — ESTRADIOL 0.1 MG/GM VA CREA
TOPICAL_CREAM | VAGINAL | Status: AC
  Filled 2013-06-06: qty 42.5

## 2013-06-06 MED ORDER — PROPOFOL 10 MG/ML IV EMUL
INTRAVENOUS | Status: AC
  Filled 2013-06-06: qty 20

## 2013-06-06 MED ORDER — ESTRADIOL 0.1 MG/GM VA CREA
TOPICAL_CREAM | VAGINAL | Status: DC | PRN
  Administered 2013-06-06: 1 via VAGINAL

## 2013-06-06 MED ORDER — NALOXONE HCL 0.4 MG/ML IJ SOLN: 0.4000 mg | INTRAMUSCULAR | Status: DC | PRN

## 2013-06-06 MED ORDER — DEXAMETHASONE SODIUM PHOSPHATE 10 MG/ML IJ SOLN
INTRAMUSCULAR | Status: DC | PRN
  Administered 2013-06-06: 10 mg via INTRAVENOUS

## 2013-06-06 MED ORDER — VILAZODONE HCL 40 MG PO TABS
40.0000 mg | ORAL_TABLET | Freq: Every evening | ORAL | Status: DC
  Administered 2013-06-07: 40 mg via ORAL
  Filled 2013-06-06: qty 1

## 2013-06-06 MED ORDER — SUMATRIPTAN SUCCINATE 100 MG PO TABS
100.0000 mg | ORAL_TABLET | ORAL | Status: DC | PRN
  Administered 2013-06-06: 100 mg via ORAL
  Filled 2013-06-06: qty 1

## 2013-06-06 MED ORDER — PANTOPRAZOLE SODIUM 40 MG PO TBEC
80.0000 mg | DELAYED_RELEASE_TABLET | Freq: Every day | ORAL | Status: DC
  Administered 2013-06-07 – 2013-06-08 (×2): 80 mg via ORAL
  Filled 2013-06-06: qty 2

## 2013-06-06 MED ORDER — MORPHINE SULFATE (PF) 1 MG/ML IV SOLN
INTRAVENOUS | Status: DC
  Administered 2013-06-06: 19.5 mg via INTRAVENOUS
  Administered 2013-06-06: 22:00:00 via INTRAVENOUS
  Administered 2013-06-07: 1.5 mg via INTRAVENOUS
  Administered 2013-06-07: 7.5 mg via INTRAVENOUS
  Administered 2013-06-07: 15 mg via INTRAVENOUS
  Filled 2013-06-06 (×3): qty 25

## 2013-06-06 MED ORDER — MONTELUKAST SODIUM 10 MG PO TABS: 10.0000 mg | ORAL_TABLET | Freq: Every day | ORAL | Status: DC

## 2013-06-06 MED ORDER — MONTELUKAST SODIUM 10 MG PO TABS
10.0000 mg | ORAL_TABLET | Freq: Every day | ORAL | Status: DC
  Administered 2013-06-06 – 2013-06-07 (×2): 10 mg via ORAL
  Filled 2013-06-06 (×2): qty 1

## 2013-06-06 MED ORDER — DEXAMETHASONE SODIUM PHOSPHATE 10 MG/ML IJ SOLN
INTRAMUSCULAR | Status: AC
  Filled 2013-06-06: qty 1

## 2013-06-06 MED ORDER — INDIGOTINDISULFONATE SODIUM 8 MG/ML IJ SOLN
INTRAMUSCULAR | Status: DC | PRN
  Administered 2013-06-06: 5 mL via INTRAVENOUS

## 2013-06-06 MED ORDER — PROMETHAZINE HCL 25 MG/ML IJ SOLN: 6.2500 mg | INTRAMUSCULAR | Status: DC | PRN

## 2013-06-06 MED ORDER — DIPHENHYDRAMINE HCL 12.5 MG/5ML PO ELIX: 12.5000 mg | ORAL_SOLUTION | Freq: Four times a day (QID) | ORAL | Status: DC | PRN

## 2013-06-06 MED ORDER — BUPIVACAINE HCL (PF) 0.25 % IJ SOLN
INTRAMUSCULAR | Status: AC
  Filled 2013-06-06: qty 30

## 2013-06-06 MED ORDER — MEPERIDINE HCL 25 MG/ML IJ SOLN: 6.2500 mg | INTRAMUSCULAR | Status: DC | PRN

## 2013-06-06 MED ORDER — BUPIVACAINE HCL 0.25 % IJ SOLN
INTRAMUSCULAR | Status: DC | PRN
  Administered 2013-06-06: 10 mL

## 2013-06-06 MED ORDER — SUCCINYLCHOLINE CHLORIDE 20 MG/ML IJ SOLN
INTRAMUSCULAR | Status: DC | PRN
  Administered 2013-06-06: 120 mg via INTRAVENOUS

## 2013-06-06 MED ORDER — KETOROLAC TROMETHAMINE 30 MG/ML IJ SOLN: 15.0000 mg | Freq: Once | INTRAMUSCULAR | Status: DC | PRN

## 2013-06-06 MED ORDER — MIDAZOLAM HCL 5 MG/5ML IJ SOLN
INTRAMUSCULAR | Status: DC | PRN
  Administered 2013-06-06: 2 mg via INTRAVENOUS

## 2013-06-06 MED ORDER — ROCURONIUM BROMIDE 50 MG/5ML IV SOLN
INTRAVENOUS | Status: AC
  Filled 2013-06-06: qty 1

## 2013-06-06 MED ORDER — LIDOCAINE HCL (CARDIAC) 20 MG/ML IV SOLN
INTRAVENOUS | Status: DC | PRN
  Administered 2013-06-06: 50 mg via INTRAVENOUS

## 2013-06-06 MED ORDER — NEOSTIGMINE METHYLSULFATE 1 MG/ML IJ SOLN
INTRAMUSCULAR | Status: DC | PRN
  Administered 2013-06-06: 2 mg via INTRAVENOUS

## 2013-06-06 MED ORDER — FENTANYL CITRATE 0.05 MG/ML IJ SOLN
INTRAMUSCULAR | Status: DC | PRN
  Administered 2013-06-06: 25 ug via INTRAVENOUS
  Administered 2013-06-06 (×2): 100 ug via INTRAVENOUS
  Administered 2013-06-06 (×2): 50 ug via INTRAVENOUS
  Administered 2013-06-06: 100 ug via INTRAVENOUS
  Administered 2013-06-06: 50 ug via INTRAVENOUS
  Administered 2013-06-06: 100 ug via INTRAVENOUS
  Administered 2013-06-06: 25 ug via INTRAVENOUS

## 2013-06-06 MED ORDER — GABAPENTIN 400 MG PO CAPS: 800.0000 mg | ORAL_CAPSULE | Freq: Three times a day (TID) | ORAL | Status: DC

## 2013-06-06 MED ORDER — PROPOFOL 10 MG/ML IV BOLUS
INTRAVENOUS | Status: DC | PRN
  Administered 2013-06-06 (×2): 20 mg via INTRAVENOUS
  Administered 2013-06-06: 180 mg via INTRAVENOUS
  Administered 2013-06-06: 20 mg via INTRAVENOUS

## 2013-06-06 MED ORDER — MIDAZOLAM HCL 2 MG/2ML IJ SOLN
INTRAMUSCULAR | Status: AC
  Filled 2013-06-06: qty 2

## 2013-06-06 MED ORDER — HYDROMORPHONE HCL PF 1 MG/ML IJ SOLN
INTRAMUSCULAR | Status: DC | PRN
  Administered 2013-06-06 (×2): 1 mg via INTRAVENOUS

## 2013-06-06 MED ORDER — LACTATED RINGERS IV SOLN
INTRAVENOUS | Status: DC
  Administered 2013-06-06 (×4): via INTRAVENOUS

## 2013-06-06 MED ORDER — FENTANYL CITRATE 0.05 MG/ML IJ SOLN
25.0000 ug | INTRAMUSCULAR | Status: DC | PRN
  Administered 2013-06-06: 50 ug via INTRAVENOUS

## 2013-06-06 MED ORDER — INDIGOTINDISULFONATE SODIUM 8 MG/ML IJ SOLN
INTRAMUSCULAR | Status: AC
  Filled 2013-06-06: qty 5

## 2013-06-06 MED ORDER — LIDOCAINE HCL (CARDIAC) 20 MG/ML IV SOLN
INTRAVENOUS | Status: AC
  Filled 2013-06-06: qty 5

## 2013-06-06 SURGICAL SUPPLY — 45 items
CANISTER SUCTION 2500CC (MISCELLANEOUS) ×5 IMPLANT
CLOTH BEACON ORANGE TIMEOUT ST (SAFETY) ×4 IMPLANT
CONT PATH 16OZ SNAP LID 3702 (MISCELLANEOUS) IMPLANT
DECANTER SPIKE VIAL GLASS SM (MISCELLANEOUS) IMPLANT
DRAPE STERI URO 9X17 APER PCH (DRAPES) ×3 IMPLANT
DRESSING TELFA 8X3 (GAUZE/BANDAGES/DRESSINGS) ×4 IMPLANT
DRSG OPSITE 6X11 MED (GAUZE/BANDAGES/DRESSINGS) ×1 IMPLANT
GAUZE PACKING 2X5 YD STERILE (GAUZE/BANDAGES/DRESSINGS) ×1 IMPLANT
GLOVE BIOGEL PI IND STRL 6.5 (GLOVE) ×3 IMPLANT
GLOVE BIOGEL PI IND STRL 8 (GLOVE) ×3 IMPLANT
GLOVE BIOGEL PI INDICATOR 6.5 (GLOVE) ×1
GLOVE BIOGEL PI INDICATOR 8 (GLOVE) ×1
GLOVE ECLIPSE 7.5 STRL STRAW (GLOVE) ×8 IMPLANT
GOWN STRL REIN XL XLG (GOWN DISPOSABLE) ×16 IMPLANT
NDL SPNL 18GX3.5 QUINCKE PK (NEEDLE) ×3 IMPLANT
NDL SPNL 22GX3.5 QUINCKE BK (NEEDLE) IMPLANT
NEEDLE HYPO 22GX1.5 SAFETY (NEEDLE) IMPLANT
NEEDLE MAYO .5 CIRCLE (NEEDLE) IMPLANT
NEEDLE SPNL 18GX3.5 QUINCKE PK (NEEDLE) IMPLANT
NEEDLE SPNL 22GX3.5 QUINCKE BK (NEEDLE) IMPLANT
NS IRRIG 1000ML POUR BTL (IV SOLUTION) ×5 IMPLANT
PACK VAGINAL WOMENS (CUSTOM PROCEDURE TRAY) ×4 IMPLANT
PAD OB MATERNITY 4.3X12.25 (PERSONAL CARE ITEMS) ×4 IMPLANT
PENCIL BUTTON BLDE SNGL 10FT (ELECTRODE) ×1 IMPLANT
SPONGE LAP 18X18 X RAY DECT (DISPOSABLE) ×2 IMPLANT
SPONGE LAP 4X18 X RAY DECT (DISPOSABLE) ×2 IMPLANT
SURGIFLO W/THROMBIN 8M KIT (HEMOSTASIS) ×1 IMPLANT
SUT VIC AB 0 CT1 18XCR BRD8 (SUTURE) ×9 IMPLANT
SUT VIC AB 0 CT1 27 (SUTURE)
SUT VIC AB 0 CT1 27XBRD ANBCTR (SUTURE) ×3 IMPLANT
SUT VIC AB 0 CT1 8-18 (SUTURE) ×12
SUT VIC AB 2-0 CT1 27 (SUTURE)
SUT VIC AB 2-0 CT1 TAPERPNT 27 (SUTURE) IMPLANT
SUT VIC AB 2-0 SH 27 (SUTURE)
SUT VIC AB 2-0 SH 27XBRD (SUTURE) ×3 IMPLANT
SUT VIC AB 2-0 UR5 27 (SUTURE) ×3 IMPLANT
SUT VIC AB 3-0 SH 27 (SUTURE)
SUT VIC AB 3-0 SH 27XBRD (SUTURE) IMPLANT
SUT VICRYL 0 TIES 12 18 (SUTURE) ×4 IMPLANT
TOWEL OR 17X24 6PK STRL BLUE (TOWEL DISPOSABLE) ×8 IMPLANT
TRAY FOLEY CATH 14FR (SET/KITS/TRAYS/PACK) ×4 IMPLANT
TROCAR XCEL NON-BLD 11X100MML (ENDOMECHANICALS) ×1 IMPLANT
TUBING CONNECTING 10 (TUBING) ×1 IMPLANT
WATER STERILE IRR 1000ML POUR (IV SOLUTION) ×3 IMPLANT
YANKAUER SUCT BULB TIP NO VENT (SUCTIONS) ×1 IMPLANT

## 2013-06-06 NOTE — Progress Notes (Signed)
Pt just admitted to me that she took one of her home medications, VIIBRYD an antidepressant 40 mg. Pt was already advised and it was explained to her that she should not take any home medications while in the hospital.

## 2013-06-06 NOTE — Op Note (Signed)
06/06/2013  5:37 PM  PATIENT:  Gloria [name]  42 y.o. female  PRE-OPERATIVE DIAGNOSIS:  menorrhagia, ovarian cyst left, fibroid uterus  POST-OPERATIVE DIAGNOSIS: 1. Menorrhagia 2. Anemia 3. Fibroid uterus 4. Intraoperative hemorrhage  PROCEDURE:  Procedure(s): 1. Transvaginal hysterectomy, partial right salpingectomy 2. Diagnostic laparoscopy 3. Laparotomy 4. Cystoscopy  SURGEON:  Surgeon(s): Terrance Mass, MD Anastasio Auerbach, MD  ANESTHESIA:   general  FINDINGS: Leiomyomatous uteri  DESCRIPTION OF OPERATION: After administration of general anesthesia, the patient was placed in the dorsolithotomy position and examination under general anesthesia performed with findings as noted above. Confirmation of the correct patient, procedure and site were established. The patient's vagina and perineum were prepped and draped in the usual sterile fashion.  The bladder was drained with a straight catheter, and a weighted speculum was placed into the vagina to visualize the cervix. The cervix was grasped with a double-tooth tenaculum. The cervix was circumferentially infiltrated in the submucosal layer with 20cc of 1% Lidocaine with 1:100,000 Epinepghrine.  A circumferential incision was made at the cervicovaginal junction. The incision was carried down sharply anteriorly to the endopelvic fascia. The bladder was dissected away from the anterior surface of the uterus using blunt dissection. The vesicouterine peritoneal reflection was identified and sharply excised. The anterior cul de sac was explored. The posterior aspect of the mucosal incision was then dissected sharply. The peritoneum was identified and incised, and the posterior cul de sac explored.A long weighted billed speculum was placed in culdesac. The uterosacral ligaments were clamped, cut and ligated with #0-Vicryl suture bilaterally. #0-Vicryl was used throughout the case unless otherwise stated. The uterosacral ligaments were  affixed to the corners of the vagina. The remainder of the cardinal and broad ligaments were then serially clamped, cut and ligated bilaterally. The uterine fundus was delivered with a single tooth tenaculum prior to clamping the upper portion of the broad ligaments.Both pedicles were then clamped and cut followed by a free tie then a transfixation stitch of 0-Vicryl. The uterus was removed intact and submitted to pathology for examination. The ovaries and fallopian tubes were identified and appeared normal. A persisting oozing was noted above the vaginal cuff which we were not successful in containing completely so was decided to proceed with a laparoscopy after the vaginal cuff was closed with interrupted sutures of 0 Vicryl suture. Of note, part of the right fallopian tube was removed in an effort to contained the oozing.  The vagina was packed with Kerlix.   The patient was then placed on a lower lithotomy position and the abdomen was prepped and draped in the usual sterile fashion. A small semilunar incision was made in the umbilicus and a 26/94 mm Optiview trocar was inserted. A pneumoperitoneum was established with approximately 3 L of carbon dioxide. Patient was placed in Trendelenburg position and they're worse approximately 50-75 cc of blood in the cul-de-sac and after irrigating the area of persistent oozing was noted near the right adnexa and a small hematoma on the left fallopian tube was noted. Since we were unsuccessful containing the bleeding laparoscopically the laparoscope and the 2  5 mm trocar sleeves were removed as well. The 3 incisions ports were reapproximated with Dermabond glue. We proceeded was doing a laparotomy. Patient's vital signs remained stable she had greater than 30 cc of urine output per hour. Intraoperative hemoglobin was 10.0, INR 1.02, PTT 32 fibrinogen 329 and platelet count 268,000. She was typed and screened for 2 units of packed red blood cell.  A Pfannenstiel skin  incision was then made 2 cm above the symphysis pubis. The incision was carried out from the skin down to subcutaneous tissue and to the rectus fascia. A midline nick was made in the fascia was extended laterally with Mayo scissors. The peritoneal cavity was entered cautiously the abdomen was packed with moist laps. The O'Connor-O'Sullivan retractors were then placed and the patient was a Trendelenburg position. The systematic inspection of the pelvic cavity demonstrated a small hematoma of the left fallopian tube. And some gradual oozing slightly above the vaginal cuff and on the left pelvic sidewall. A small venous vessel was contained with free tie 0 Vicryl suture. Because of the slight generalized oozing we proceeded with applying Surgiflo hemostatic matrix x2. Prior to this pressure was applied to the area and then after applying the Surgiflo there appeared to be good hemostasis. Both pedicles otherwise looked dry. The sponge count was correct. The O'Connor-O'Sullivan retractor was removed. The visceral peritoneum was not reapproximated. The subcutaneous bleeders were Bovie cauterized. The skin was reapproximated with skin clips followed by placement of Xeroform gauze and 4 x 4 dressing.  Patient was then placed on high lithotomy position for the cystoscopic portion of the operation. 10 minutes prior she was given indigo carmine IV and a 70 cystoscope with normal saline was introduced through the urethra into the bladder both ureteral orifice were identified and dye was noted to be extruding from each orifice. The remainder of the bladder mucosa appeared to be intact.  The patient was extubated and transferred to recovery room stable vital signs. We'll be checking her CBC at 0500 hours and hold transfusion for analysis patient is hemodynamically stable and intraoperative hemoglobin was 10.0.   ESTIMATED BLOOD LOSS: Approximately 800 cc preop hemoglobin 11.9 intraoperative hemoglobin  10.0  Intake/Output Summary (Last 24 hours) at 06/06/13 1737 Last data filed at 06/06/13 1659  Gross per 24 hour  Intake   3600 ml  Output   1080 ml  Net   2520 ml     BLOOD ADMINISTERED:none   LOCAL MEDICATIONS USED:  MARCAINE   0.25 subcutaneous laparoscopic ports total 10 cc. Vaginal hysterectomy portion lidocaine 1% with 1 100,000 epinephrine total 20 cc   SPECIMEN:  Source of Specimen:  Uterus and cervix and partial right fallopian tube  DISPOSITION OF SPECIMEN:  PATHOLOGY  COUNTS:  YES  PLAN OF CARE: Transfer to [place] HMD5:37 PMTD@

## 2013-06-06 NOTE — H&P (View-Only) (Signed)
Gloria [name] is an 42 y.o. female. For preop consultation for her plan total vaginal hysterectomy with possible left ovarian cystectomy secondary to symptomatic leiomyomatous uteri contributing to anemia and dysfunctional  uterine bleeding  . Patient is a 42 year old gravida 1 para 1 who was seen in the office on June 10 as a result of her irregular heavy menses. Patient with Paraguard IUD that was placed in 2009. Marland KitchenShe has past history of ETOH abuse and has been sober for for 4 years and stopped smoking March 2013.Dr. Patrecia Pour has been treating her for her fibromyalgia and Dr. Earlean Shawl has been following her for her chronic active hepatitis. Patient had liver needle core biopsy in 2009 which confirmed the diagnosis.  Dr. Tollie Pizza has been her primary physician who has been monitoring her hyperlipidemia. Patient's recent liver function tests were normal but her total cholesterol, triglycerides, LDL, and the LDL were all elevated. Her primary physician will be placing her on a low statin with close monitorization of liver function tests as recommended by her gastroenterologist Dr. Earlean Shawl.  Patient was seen in the office on 05/23/2013 for sonohysterogram and endometrial biopsy with the following results: Ultrasound:  Uterus measured 8.0 x 5.9 x 5.5 cm endometrial stripe 14.3 mm. A left subserosal fibroid measuring 26 x 26 x 26 mm was noted unchanged from 2011.  Uterus was seen in normal position. Right ovary was normal. Left ovary solid mass 19 x 18 mm heterogeneous echo pattern with positive color flow in the periphery. There was no fluid in the cul-de-sac. Sonohysterogram after injection of sterile saline demonstrated the endometrial cavity thickened but no polyps or myomas noted. Anterior wall measured 8 mm and posterior wall measured 6 mm.  Endometrial biopsy results from that day as follows: Endometrium, biopsy, uterus - SECRETORY ENDOMETRIUM. NO HYPERPLASIA OR CARCINOMA.   Pertinent Gynecological  History: Menses: Menorrhagia Bleeding: dysfunctional uterine bleeding Contraception: IUD DES exposure: denies Blood transfusions: Not certain Sexually transmitted diseases: no past history Previous GYN Procedures: Vaginal delivery  Last mammogram: normal Date: 2014 Last pap: normal Date: 2011 OB History: G1, P 1   Menstrual History: Menarche age: 73  No LMP recorded.    Past Medical History  Diagnosis Date  . Smoker   . History of ETOH abuse   . Elevated LFTs   . Fibromyalgia   . Bulging disc     X 2  . Arthritis   . Hyperlipidemia     Past Surgical History  Procedure Laterality Date  . Intrauterine device insertion  08/09/2008    PARAGUARD  . Neck surgery      Family History  Problem Relation Age of Onset  . Breast cancer Mother   . Breast cancer Maternal Aunt   . Breast cancer Paternal Grandmother     COLON  . Cancer Paternal Grandfather     THROAT .[name]    Social History:  reports that she quit smoking about 16 months ago. She has never used smokeless tobacco. She reports that she does not drink alcohol or use illicit drugs.  Allergies:  Allergies  Allergen Reactions  . Benadryl (Diphenhydramine Hcl) Hives     (Not in a hospital admission)  REVIEW OF SYSTEMS: A ROS was performed and pertinent positives and negatives are included in the history.  GENERAL: No fevers or chills. HEENT: No change in vision, no earache, sore throat or sinus congestion. NECK: No pain or stiffness. CARDIOVASCULAR: No chest pain or pressure. No palpitations. PULMONARY: No shortness of breath, cough  or wheeze. GASTROINTESTINAL: No abdominal pain, nausea, vomiting or diarrhea, melena or bright red blood per rectum. GENITOURINARY: No urinary frequency, urgency, hesitancy or dysuria. MUSCULOSKELETAL: No joint or muscle pain, no back pain, no recent trauma. DERMATOLOGIC: No rash, no itching, no lesions. ENDOCRINE: No polyuria, polydipsia, no heat or cold intolerance. No recent change in  weight. HEMATOLOGICAL: No anemia or easy bruising or bleeding. NEUROLOGIC: No headache, seizures, numbness, tingling or weakness. PSYCHIATRIC: No depression, no loss of interest in normal activity or change in sleep pattern.     Blood pressure 126/72.  Physical Exam:  HEENT:unremarkable Neck:Supple, midline, no thyroid megaly, no carotid bruits Lungs:  Clear to auscultation no rhonchi's or wheezes Heart:Regular rate and rhythm, no murmurs or gallops Breast Exam:examined December 2013 normal Abdomen:soft nontender no rebound guarding Pelvic:BUSwithin normal limits Vagina:no lesions or discharge Cervix:no lesion or discharge Uterus:anteverted slightly irregular at the fundus patient's left side secondary to fibroid Adnexa:same as above Extremities: No cords, no edema Rectal:unremarkable   Assessment/plan: Due to the fact the patient has chronic active hepatitis any form of hormone manipulation is contraindicated because of metabolism In the liver. We discussed different treatment options for her menorrhagia and the safest route would be proceeding with a transvaginal hysterectomy and possible left ovarian cystectomy with ovarian conservation. A CA 125 Recently done had a value of 32 upper limits of normal being 30 but this may be attributed to her fibroid uterus. Patient is currently taking 2 arm tablets daily because her last hemoglobin was 10.8.she has been cleared by her primary physician Dr. Tollie Pizza. The following risk for surgery were discussed as follows:                        Patient was counseled as to the risk of surgery to include the following:  1. Infection (prohylactic antibiotics will be administered)  2. DVT/Pulmonary Embolism (prophylactic pneumo compression stockings will be used)  3.Trauma to internal organs requiring additional surgical procedure to repair any injury to     Internal organs requiring perhaps additional hospitalization days.  4.Hemmorhage requiring  transfusion and blood products which carry risks such as  anaphylactic reaction, hepatitis and AIDS  Patient had received literature information on the procedure scheduled and all her questions were answered and fully accepts all risk.  [facility] HMD3:42 PMTD@  Gloria [name] H 06/04/2013, 3:33 PM In a

## 2013-06-06 NOTE — Interval H&P Note (Signed)
History and Physical Interval Note:  06/06/2013 12:46 PM  Gloria [name]  has presented today for surgery, with the diagnosis of menorrhagia, ovarian cyst  The various methods of treatment have been discussed with the patient and family. After consideration of risks, benefits and other options for treatment, the patient has consented to  Procedure(s): TOTAL HYSTERECTOMY VAGINAL/POSSIBLE LEFT OVARIAN CYSTECTOMY/POSSIBLE LEFT SALPINGO OOPHORECTOMY (Left) as a surgical intervention .  The patient's history has been reviewed, patient examined, no change in status, stable for surgery.  I have reviewed the patient's chart and labs.  Questions were answered to the patient's satisfaction.     Terrance Mass

## 2013-06-06 NOTE — Anesthesia Preprocedure Evaluation (Addendum)
Anesthesia Evaluation  Patient identified by MRN, date of birth, ID band Patient awake    Reviewed: Allergy & Precautions, H&P , NPO status , Patient's Chart, lab work & pertinent test results  Airway Mallampati: II TM Distance: >3 FB Neck ROM: full    Dental no notable dental hx. (+) Teeth Intact   Pulmonary neg pulmonary ROS,    Pulmonary exam normal       Cardiovascular negative cardio ROS      Neuro/Psych    GI/Hepatic Neg liver ROS,   Endo/Other  negative endocrine ROS  Renal/GU negative Renal ROS     Musculoskeletal   Abdominal Normal abdominal exam  (+)   Peds negative pediatric ROS (+)  Hematology negative hematology ROS (+)   Anesthesia Other Findings   Reproductive/Obstetrics negative OB ROS                          Anesthesia Physical Anesthesia Plan  ASA: II  Anesthesia Plan: General   Post-op Pain Management:    Induction: Intravenous  Airway Management Planned: Oral ETT  Additional Equipment:   Intra-op Plan:   Post-operative Plan: Extubation in OR  Informed Consent: I have reviewed the patients History and Physical, chart, labs and discussed the procedure including the risks, benefits and alternatives for the proposed anesthesia with the patient or authorized representative who has indicated his/her understanding and acceptance.   Dental Advisory Given  Plan Discussed with: CRNA and Surgeon  Anesthesia Plan Comments:        Anesthesia Quick Evaluation

## 2013-06-06 NOTE — Anesthesia Postprocedure Evaluation (Signed)
  Anesthesia Post Note  Patient: Gloria [name]  Procedure(s) Performed: Procedure(s) (LRB): Vaginal Hysterectomy with Patiial Right Salpingectomy (Left) LAPAROSCOPY DIAGNOSTIC (N/A) EXPLORATORY LAPAROTOMY CYSTOSCOPY  Anesthesia type: GA  Patient location: PACU  Post pain: Pain level controlled  Post assessment: Post-op Vital signs reviewed  Last Vitals:  Filed Vitals:   06/06/13 1730  BP: 128/60  Pulse: 73  Temp:   Resp: 20    Post vital signs: Reviewed  Level of consciousness: sedated  Complications: No apparent anesthesia complications

## 2013-06-06 NOTE — Transfer of Care (Signed)
Immediate Anesthesia Transfer of Care Note  Patient: Gloria [name]  Procedure(s) Performed: Procedure(s): Vaginal Hysterectomy with Patiial Right Salpingectomy (Left) LAPAROSCOPY DIAGNOSTIC (N/A) EXPLORATORY LAPAROTOMY CYSTOSCOPY  Patient Location: PACU  Anesthesia Type:General  Level of Consciousness: awake  Airway & Oxygen Therapy: Patient Spontanous Breathing  Post-op Assessment: Report given to PACU RN  Post vital signs: stable  Filed Vitals:   06/06/13 1128  BP: 119/80  Pulse: 84  Temp: 36.9 C  Resp: 18    Complications: No apparent anesthesia complications

## 2013-06-06 NOTE — OR Nursing (Signed)
Familu updated at 1615

## 2013-06-06 NOTE — OR Nursing (Signed)
Surgiflo 2000 units used in abdominal cavity per Dr. Toney Rakes

## 2013-06-07 ENCOUNTER — Encounter (HOSPITAL_COMMUNITY): Payer: Self-pay | Admitting: *Deleted

## 2013-06-07 DIAGNOSIS — N920 Excessive and frequent menstruation with regular cycle: Secondary | ICD-10-CM

## 2013-06-07 DIAGNOSIS — D259 Leiomyoma of uterus, unspecified: Secondary | ICD-10-CM

## 2013-06-07 DIAGNOSIS — D649 Anemia, unspecified: Secondary | ICD-10-CM

## 2013-06-07 LAB — CBC
HCT: 30.5 % — ABNORMAL LOW (ref 36.0–46.0)
Hemoglobin: 9.5 g/dL — ABNORMAL LOW (ref 12.0–15.0)
MCH: 25.5 pg — ABNORMAL LOW (ref 26.0–34.0)
MCHC: 31.1 g/dL (ref 30.0–36.0)
MCV: 81.8 fL (ref 78.0–100.0)
RDW: 14.7 % (ref 11.5–15.5)

## 2013-06-07 LAB — BASIC METABOLIC PANEL
BUN: 6 mg/dL (ref 6–23)
Calcium: 8.6 mg/dL (ref 8.4–10.5)
Creatinine, Ser: 0.59 mg/dL (ref 0.50–1.10)
GFR calc Af Amer: >90 mL/min (ref >90)
GFR calc non Af Amer: >90 mL/min (ref >90)
Glucose, Bld: 139 mg/dL — ABNORMAL HIGH (ref 70–99)
Potassium: 4 mEq/L (ref 3.5–5.1)

## 2013-06-07 MED ORDER — METOCLOPRAMIDE HCL 5 MG/ML IJ SOLN
10.0000 mg | Freq: Four times a day (QID) | INTRAMUSCULAR | Status: DC
  Administered 2013-06-07 (×2): 10 mg via INTRAVENOUS
  Filled 2013-06-07 (×2): qty 2

## 2013-06-07 MED ORDER — METOCLOPRAMIDE HCL 10 MG PO TABS: 10.0000 mg | ORAL_TABLET | Freq: Four times a day (QID) | ORAL | Status: DC | PRN

## 2013-06-07 NOTE — Anesthesia Postprocedure Evaluation (Signed)
  Anesthesia Post-op Note  Patient: Gloria [name]  Procedure(s) Performed: Procedure(s): Vaginal Hysterectomy with Patiial Right Salpingectomy (Left) LAPAROSCOPY DIAGNOSTIC (N/A) EXPLORATORY LAPAROTOMY CYSTOSCOPY  Patient Location: [facility]  Anesthesia Type:General  Level of Consciousness: awake, alert  and oriented  Airway and Oxygen Therapy: Patient Spontanous Breathing and Patient connected to nasal cannula oxygen  Post-op Pain: mild  Post-op Assessment: Post-op Vital signs reviewed and Patient's Cardiovascular Status Stable  Post-op Vital Signs: Reviewed and stable  Complications: No apparent anesthesia complications

## 2013-06-07 NOTE — Progress Notes (Signed)
1 Day Post-Op Procedure(s) (LRB): Vaginal Hysterectomy with Patiial Right Salpingectomy, LAPAROSCOPY DIAGNOSTIC (N/A) EXPLORATORY LAPAROTOMY CYSTOSCOPY  Subjective: Patient reports tolerating PO and + flatus.    Objective: I have reviewed patient's vital signs, intake and output, medications and labs.  Preoperative hemoglobin 11.9. Intraoperative hemoglobin 10.0. Hemoglobin this morning 9.5  General: alert and no distress Resp: clear to auscultation bilaterally Cardio: regular rate and rhythm, S1, S2 normal, no murmur, click, rub or gallop GI: soft, non-tender; bowel sounds normal; no masses,  no organomegaly Extremities: extremities normal, atraumatic, no cyanosis or edema Vaginal Bleeding: Vaginal packing was removed Vaginal packing removed  Incision ports intact, dressing dry  Assessment: s/p : Patient status post transvaginal hysterectomy, right salpingectomy, diagnostic laparoscopy, laparotomy and cystoscopy. patient with stable vital signs throughout the night afebrile and good urine output. Vaginal packing and Foley catheter removed today.  Plan: Advance diet Encourage ambulation Discontinue IV fluids TC PCA pump Will start by mouth pain medication Percocet when necessary Will start preop meds  LOS: 1 day    Liliya Fullenwider H 06/07/2013, 8:58 AM

## 2013-06-08 MED ORDER — DSS 100 MG PO CAPS: 100.0000 mg | ORAL_CAPSULE | Freq: Two times a day (BID) | ORAL | Status: DC | PRN

## 2013-06-08 MED ORDER — BISACODYL 10 MG RE SUPP: 10.0000 mg | Freq: Every day | RECTAL | Status: DC | PRN

## 2013-06-08 MED ORDER — OXYCODONE-ACETAMINOPHEN 5-325 MG PO TABS
1.0000 | ORAL_TABLET | Freq: Four times a day (QID) | ORAL | Status: DC | PRN
Start: 2013-06-08 — End: 2013-06-12

## 2013-06-08 MED ORDER — METOCLOPRAMIDE HCL 10 MG PO TABS: 10.0000 mg | ORAL_TABLET | Freq: Four times a day (QID) | ORAL | Status: DC | PRN

## 2013-06-08 MED ORDER — OXYCODONE-ACETAMINOPHEN 5-325 MG PO TABS: 1.0000 | ORAL_TABLET | Freq: Four times a day (QID) | ORAL | Status: DC | PRN

## 2013-06-08 MED ORDER — DOCUSATE SODIUM 100 MG PO CAPS
100.0000 mg | ORAL_CAPSULE | Freq: Two times a day (BID) | ORAL | Status: DC | PRN
  Administered 2013-06-08: 100 mg via ORAL
  Filled 2013-06-08 (×2): qty 1

## 2013-06-08 NOTE — Progress Notes (Signed)
Pt discharged to home with mother and 43yo son.  Condition stable.  Pt ambulated to car with [name], NT.  No equipment for home ordered at discharge.

## 2013-06-08 NOTE — Discharge Summary (Signed)
Physician Discharge Summary  Patient ID: Gloria [name] MRN: [id number] DOB/AGE: March 18, 1971 42 y.o.  Admit date: 06/06/2013 Discharge date: 06/08/2013  Admission Diagnoses: Fibroid uterus, anemia  Discharge Diagnoses: Fibroid uterus, intraoperative hemorrhage, anemia  Discharged Condition: good  Hospital Course: Patient was admitted to the hospital on July 16. The patient underwent a transvaginal hysterectomy and right salpingectomy secondary to dysmenorrhea, menorrhagia, and leiomyomatous uteri and thickened endometrium. Due to intraoperative hemorrhage and emergency laparoscopy and laparotomy was undertaken to contain the vaginal cuff bleeding. Postoperative cystoscopy demonstrated patent bilateral ureteral orifices. Patient had originally during the operation been typed and screened for 2 units of packed red blood cells and she lost approximately 800 cc intraoperatively. Her preoperative hemoglobin was 11.9 and intraoperative hemoglobin was 10.0 and postop hemoglobin was 9.5 with normal metabolic panel. Patient did well postoperatively she was normotensive and non-tachycardic and was ambulating and passing flatus and diet was advanced from clear liquid to a full regular diet. The patient was ambulating. Her incision sites were intact and no reported vaginal bleeding. Pathology report pending at time of this dictation.  Consults: None  Significant Diagnostic Studies: labs: Preop Hgb 11.9, intraop Hgb: 10.0, Post OP Hgb:9.5 with normal basic metabolic panel   Treatments: surgery: 1. TVH with right salpingectomy 2. Laparoscopy 3. Laparotomy, 4. Cystoscopy  Discharge Exam: Blood pressure 124/67, pulse 80, temperature 98.4 F (36.9 C), temperature source Oral, resp. rate 18, height 5' 8"  (1.727 m), weight 217 lb (98.431 kg), SpO2 98.00%. General appearance: alert and cooperative Cardio: regular rate and rhythm, S1, S2 normal, no murmur, click, rub or gallop GI: soft, non-tender; bowel sounds  normal; no masses,  no organomegaly Extremities: extremities normal, atraumatic, no cyanosis or edema Incision/Wound:Intact No Vaginal bleeding  Disposition: 01-Home or Self Care  Discharge Orders   Future Appointments Provider Department Dept Phone   06/21/2013 11:20 AM Terrance Mass, MD [facility] [phone]   Future Orders Complete By Expires     Call MD for:  redness, tenderness, or signs of infection (pain, swelling, bleeding, redness, odor or green/yellow discharge around incision site)  As directed     Call MD for:  severe or increased pain, loss or decreased feeling  in affected limb(s)  As directed     Call MD for:  temperature >100.5  As directed     Discharge instructions  As directed     Comments:      Post OP  appointment with Dr. Toney Rakes Monday the 4th at 11:30 am  Hysterectomy, Abdominal & Vaginal Care After Refer to this sheet in the next few weeks. These discharge instructions provide you with general information on caring for yourself after you leave the hospital. Your caregiver may also give you specific instructions. Your treatment has been planned according to the most current medical practices available, but unavoidable complications sometimes occur. If you have any problems or questions after discharge, please call your caregiver. HOME CARE INSTRUCTIONS Healing will take time. You will have tenderness at the surgery site. There may be some swelling and bruising around this area if you had an abdominal hysterectomy. Have an adult stay with you the first 48 to 72 hours after surgery, and then for 1 to 2 weeks afterward to help with daily activities. Only take over-the-counter or prescription medicines for pain, discomfort, or fever as directed by your caregiver.  Do not take aspirin. It can cause bleeding.  Do not drive when taking pain medicine.  It will be normal to  be sore for a couple weeks after surgery. See your caregiver if this seems  to be getting worse rather than better.  Follow your caregiver's advice regarding diet, exercise, lifting, driving, and general activities.  Take showers instead of baths for a few weeks as directed.  You may resume your usual diet as directed.  Get plenty of rest and sleep.  Do not douche, use tampons, or have sexual intercourse until your caregiver says it is okay.  Change your bandages (dressings) as directed if you had an abdominal hysterectomy.  Take your temperature twice a day and write it down.  Do not drink alcohol until your caregiver says it is okay.  If you develop constipation, you may take a mild laxative with your caregiver's permission. Eating bran foods helps with constipation problems. Drink enough water and fluids to keep your urine clear or pale yellow.  Do not sign any legal documents until you feel normal again.  Keep all of your follow-up appointments.  Make sure you and your family understand everything about your operation and recovery.  SEEK MEDICAL CARE IF: There is swelling, redness, or increasing pain in the wound area.  Fluid (pus) is coming from the wound.  You notice a bad smell coming from the wound or surgical dressing.  You have pain, redness, and swelling from the intravenous (IV) site.  The wound breaks open.  You feel dizzy.  You develop pain or bleeding when you urinate.  You develop diarrhea.  You feel sick to your stomach (nauseous) and throw up (vomit).  You develop abnormal vaginal discharge.  You develop a rash.  You have any type of abnormal reaction or develop an allergy to your medicine.  Your pain medicine is not relieving your pain.  seek immediate medical care if: You have an oral temperature above 100, not controlled by medicine. HYYou develop chest pain.  You develop shortness of breath.  You pass out (faint).  You develop pain, swelling, or redness of your leg.  You develop heavy vaginal bleeding, with or without blood clots.  MAKE  SURE YOU: Understand these instructions.  Will watch your condition.  Will get help right away if you are not doing well or get worse.  Document Released: 01/29/2004 Document Re-Released: 04/28/2010 [facility] Patient Information 2011 [place].   [facility] HMD7:52 AMTD@    Driving Restrictions  As directed     Comments:      No driving for 1  weeks    Lifting restrictions  As directed     Comments:      No lifting for 6 weeks    Resume previous diet  As directed         Medication List         B COMPLEX PO  Take 1 tablet by mouth daily.     CALCIUM 600/VITAMIN D 600-400 MG-UNIT per tablet  Generic drug:  Calcium Carbonate-Vitamin D  Take 1 tablet by mouth daily.     cetirizine 10 MG tablet  Commonly known as:  ZYRTEC  Take 10 mg by mouth daily.     CoQ10 100 MG Caps  Take 100 mg by mouth daily.     DSS 100 MG Caps  Take 100 mg by mouth 2 (two) times daily as needed.     esomeprazole 40 MG capsule  Commonly known as:  NEXIUM  Take 40 mg by mouth daily before breakfast.     eszopiclone 3 MG Tabs  Generic drug:  Eszopiclone  Take 3 mg by mouth at bedtime. Take immediately before bedtime     gabapentin 800 MG tablet  Commonly known as:  NEURONTIN  Take 800 mg by mouth 3 (three) times daily.     hydrOXYzine 25 MG tablet  Commonly known as:  ATARAX/VISTARIL  Take 25 mg by mouth daily as needed for anxiety.     LORazepam 1 MG tablet  Commonly known as:  ATIVAN  Take 1 mg by mouth daily as needed for anxiety.     magnesium gluconate 500 MG tablet  Commonly known as:  MAGONATE  Take 500 mg by mouth 3 (three) times daily.     metoCLOPramide 10 MG tablet  Commonly known as:  REGLAN  Take 1 tablet (10 mg total) by mouth every 6 (six) hours as needed.     montelukast 10 MG tablet  Commonly known as:  SINGULAIR  Take 10 mg by mouth at bedtime.     multivitamin with minerals Tabs  Take 1 tablet by mouth daily.     oxyCODONE-acetaminophen 5-325 MG  per tablet  Commonly known as:  PERCOCET/ROXICET  Take 1-2 tablets by mouth every 6 (six) hours as needed.     rizatriptan 10 MG tablet  Commonly known as:  MAXALT  Take 10 mg by mouth as needed for migraine. May repeat in 2 hours if needed     thiamine 50 MG tablet  Commonly known as:  VITAMIN B-1  Take 50 mg by mouth daily.     VIIBRYD 40 MG Tabs  Generic drug:  Vilazodone HCl  Take 40 mg by mouth every evening.     vitamin C 500 MG tablet  Commonly known as:  ASCORBIC ACID  Take 500 mg by mouth daily.        Patient to go to the office on Tuesday 22 of July at 12:30 to remove staples  Signed: Terrance Mass 06/08/2013, 7:54 AM

## 2013-06-09 LAB — TYPE AND SCREEN
ABO/RH(D): O POS
Antibody Screen: NEGATIVE
Unit division: 0

## 2013-06-12 ENCOUNTER — Encounter: Payer: Self-pay | Admitting: Gynecology

## 2013-06-12 ENCOUNTER — Ambulatory Visit (INDEPENDENT_AMBULATORY_CARE_PROVIDER_SITE_OTHER): Payer: BC Managed Care – PPO | Admitting: Gynecology

## 2013-06-12 ENCOUNTER — Telehealth: Payer: Self-pay | Admitting: *Deleted

## 2013-06-12 VITALS — BP 124/80

## 2013-06-12 DIAGNOSIS — Z9889 Other specified postprocedural states: Secondary | ICD-10-CM

## 2013-06-12 MED ORDER — OXYCODONE-ACETAMINOPHEN 5-325 MG PO TABS: 1.0000 | ORAL_TABLET | Freq: Four times a day (QID) | ORAL | Status: DC | PRN

## 2013-06-12 MED ORDER — IBUPROFEN 800 MG PO TABS: 800.0000 mg | ORAL_TABLET | Freq: Three times a day (TID) | ORAL | Status: DC | PRN

## 2013-06-12 NOTE — Progress Notes (Signed)
The patient presented to the office today to have her staples removed. The patient is status post transvaginal hysterectomy with partial right salpingectomy, diagnostic laparoscopy, laparotomy, and cystoscopy as a result of her menorrhagia, anemia, and fibroid uterus. Pathology report demonstrated the following:  Uterus and cervix, with right fallopian tube - CERVIX: UNREMARKABLE. - ENDOMETRIUM: PROLIFERATIVE WITH INTRAUTERINE DEVICE. - MYOMETRIUM: LEIOMYOMA. - UTERINE SEROSA: UNREMARKABLE. NO ENDOMETRIOSIS OR MALIGNANCY. - RIGHT FALLOPIAN TUBE: UNREMARKABLE. NO ENDOMETRIOSIS OR MALIGNANCY  Patient's complaining some constipation she had 1 bowel movements and she left the hospital less than a week ago and has been voiding well. Patient has been passing gas. Patient denies any urinary discomfort. No fever reported.  Exam: Port sites intact Pfannenstiel incision intact. The staples were removed and Steri-Stripped the incision.  Patient will be prescribed Motrin 800 mg 3 times a day when necessary and prescription refill for Percocet 5/325 to take one by mouth every 4-6 hours was provided. To help with her constipation in addition to her MiraLax that she takes in the morning I would like for her to take a Colace tablet in the evening. She will purchase to take one bottle today and one tomorrow if needed of magnesium citrate. Patient will return back to the office at the end of the month for full postop visit. She will hold off on the iron tablet until she starts having regular bowel movements.

## 2013-06-12 NOTE — Telephone Encounter (Signed)
She does not need any want to stay with her she is doing well. I would not take the Lunesta it has been less than 4 hours since she took her previous Percocet

## 2013-06-12 NOTE — Telephone Encounter (Signed)
Pt informed with the below note. 

## 2013-06-12 NOTE — Telephone Encounter (Signed)
Pt was seen today to have staples removed, had 2 questions for you:  1. Does she still need someone to stay with her 24 hours? If yes how long? 2.Can she Lunesta at night to help sleep? She wasn't sure if okay to mix with the pain medications?  Please advise

## 2013-06-21 ENCOUNTER — Ambulatory Visit (INDEPENDENT_AMBULATORY_CARE_PROVIDER_SITE_OTHER): Payer: BC Managed Care – PPO | Admitting: Gynecology

## 2013-06-21 ENCOUNTER — Encounter: Payer: Self-pay | Admitting: Gynecology

## 2013-06-21 VITALS — BP 140/80

## 2013-06-21 DIAGNOSIS — Z9889 Other specified postprocedural states: Secondary | ICD-10-CM

## 2013-06-21 DIAGNOSIS — N898 Other specified noninflammatory disorders of vagina: Secondary | ICD-10-CM

## 2013-06-21 DIAGNOSIS — N390 Urinary tract infection, site not specified: Secondary | ICD-10-CM

## 2013-06-21 LAB — URINALYSIS W MICROSCOPIC + REFLEX CULTURE
Bilirubin Urine: NEGATIVE
Casts: NONE SEEN
Crystals: NONE SEEN
Glucose, UA: NEGATIVE mg/dL
Ketones, ur: NEGATIVE mg/dL
Specific Gravity, Urine: >1.03 — ABNORMAL HIGH (ref 1.005–1.030)
Urobilinogen, UA: 0.2 mg/dL (ref 0.0–1.0)
pH: 6 (ref 5.0–8.0)

## 2013-06-21 MED ORDER — METRONIDAZOLE 500 MG PO TABS: 500.0000 mg | ORAL_TABLET | Freq: Two times a day (BID) | ORAL | Status: DC

## 2013-06-21 MED ORDER — METRONIDAZOLE 0.75 % VA GEL: VAGINAL | Status: DC

## 2013-06-21 MED ORDER — CIPROFLOXACIN HCL 250 MG PO TABS
250.0000 mg | ORAL_TABLET | Freq: Two times a day (BID) | ORAL | Status: DC
Start: 2013-06-21 — End: 2013-10-12

## 2013-06-21 NOTE — Progress Notes (Signed)
Patient presented to the office today for 2 week postop visit.patient status post transvaginal hysterectomy with partial right salpingectomy along with diagnostic laparoscopy and laparotomy with cystoscopy secondary to menorrhagia, anemia, fibroid uterus and intraoperative hemorrhage. The patient was doing well with the exception that she thought that she may be leaking some urine and a vaginal discharge with odor. Some slight right lower abdominal discomfort which she described as being there present even before her surgery.  Exam: All port sites are intact abdomen soft minimal tenderness in the right lower quadrant.  Pelvic exam: Bartholin urethra Skene within normal limits Vagina: Fascia like brownish discharge was noted the vaginal cuff was intact the suture material was present. A wet prep was obtained. Bimanual exam no  Masses  were palpated at the vaginal cuff. Rectal exam: Not done  Urinalysis: Too numerous to count WBC, 11-20 RBC, many bacteria culture submitted  Wet prep Pos Amine, too numerous to count bacteria too numerous to count white blood cell  Assessment/plan: Two-week postop bacterial vaginosis and possibly urinary tract infection. Patient will be placed on Cipro 250 mg one by mouth twice a day for 7 days. She will use MetroGel vaginal cream to apply each bedtime for 5 nights. Patient returned back in one week for followup exam.

## 2013-06-23 LAB — URINE CULTURE

## 2013-06-26 ENCOUNTER — Encounter: Payer: Self-pay | Admitting: Gynecology

## 2013-06-28 ENCOUNTER — Telehealth: Payer: Self-pay

## 2013-06-28 ENCOUNTER — Ambulatory Visit (INDEPENDENT_AMBULATORY_CARE_PROVIDER_SITE_OTHER): Payer: BC Managed Care – PPO | Admitting: Gynecology

## 2013-06-28 ENCOUNTER — Encounter: Payer: Self-pay | Admitting: Gynecology

## 2013-06-28 VITALS — BP 128/84

## 2013-06-28 DIAGNOSIS — Z09 Encounter for follow-up examination after completed treatment for conditions other than malignant neoplasm: Secondary | ICD-10-CM

## 2013-06-28 MED ORDER — OXYCODONE-ACETAMINOPHEN 5-325 MG PO TABS: 1.0000 | ORAL_TABLET | Freq: Four times a day (QID) | ORAL | Status: DC | PRN

## 2013-06-28 MED ORDER — FLUCONAZOLE 100 MG PO TABS: ORAL_TABLET | ORAL | Status: DC

## 2013-06-28 NOTE — Progress Notes (Signed)
The patistatus post transvaginal hysterectomy with partial right salpingectomy along with diagnostic laparoscopy and laparotomy with cystoscopy secondary to menorrhagia, anemia, fibroid uterus and intraoperative hemorrhage. Patient was doing well is not taking much narcotics for pain. She has no problems with bowel movements or urinary function. Occasional twinges in the right lower abdomen. No vaginal discharge no leakage of urine reported was in good spirits and otherwise doing well.   Pathology report for surgery: Uterus and cervix, with right fallopian tube - CERVIX: UNREMARKABLE. - ENDOMETRIUM: PROLIFERATIVE WITH INTRAUTERINE DEVICE. - MYOMETRIUM: LEIOMYOMA. - UTERINE SEROSA: UNREMARKABLE. NO ENDOMETRIOSIS OR MALIGNANCY. - RIGHT FALLOPIAN TUBE: UNREMARKABLE. NO ENDOMETRIOSIS OR MALIGNANCY.  Exam: Abdomen: Incisions intact healing well. Abdomen soft nontender no rebound guarding Pelvic: Urethra Skene was within normal limits Vagina: Vaginal cuff intact suture material present no unusual discharge or any foul odor. Bimanual exam vaginal cuff intact no palpable masses or tenderness Rectal exam: Not done  Assessment/plan: Patient 3 weeks postop will return back to the office in 3 weeks for final postop visit. She is going to return to work next week but on a limited half-day schedule. She was given a prescription of Diflucan 100 mg to take 1 by mouth as needed. She was given a prescription refill for Percocet.

## 2013-06-28 NOTE — Telephone Encounter (Signed)
Patient wants to know if okay to go swimming?  Dr. Moshe Salisbury overheard me taking this call and responded verbally. He said no swimming before next post op visit and we will reassess then. Could soften sutures and cause incision problems.  Patient advised.

## 2013-07-02 ENCOUNTER — Telehealth: Payer: Self-pay | Admitting: *Deleted

## 2013-07-02 ENCOUNTER — Telehealth: Payer: Self-pay

## 2013-07-02 NOTE — Telephone Encounter (Signed)
Patient said her employer is requesting letter for return to work and that I need to be very detailed about restrictions.  She plans to return four hours a day the week of Aug 18 and Full time the week of Aug 25.  Please let me know what restrictions she needs in place and do you have an idea when she might return without restrictions?

## 2013-07-02 NOTE — Telephone Encounter (Signed)
Letter faxed to employer and mailed original to patient.

## 2013-07-02 NOTE — Telephone Encounter (Signed)
Correctly stated below. She should not be doing any lifting more than 20 pounds until she returns to full time schedule.

## 2013-07-02 NOTE — Telephone Encounter (Signed)
Please call in ultram 57m one po q 6hours prn. #30  If no fever and urination and BM ok its not unusual for cramping on and off as healing continues.Reassure.

## 2013-07-02 NOTE — Telephone Encounter (Signed)
Pt is post transvaginal hysterectomy with partial right salpingectomy along with diagnostic laparoscopy and laparotomy with cystoscopy secondary to menorrhagia, anemia, fibroid uterus and intraoperative hemorrhage. Pt said all day she has been having some cramping, no bleeding. She is taking motrin 800 mg which takes the edge off but still bad cramps. OV offered to pt and she declined, asking me to relay to you if this was normal? Please advise

## 2013-07-03 NOTE — Telephone Encounter (Signed)
Pt informed with the below note. She refused the Rx.

## 2013-07-20 ENCOUNTER — Ambulatory Visit (INDEPENDENT_AMBULATORY_CARE_PROVIDER_SITE_OTHER): Payer: BC Managed Care – PPO | Admitting: Gynecology

## 2013-07-20 VITALS — BP 124/80

## 2013-07-20 DIAGNOSIS — Z4889 Encounter for other specified surgical aftercare: Secondary | ICD-10-CM

## 2013-07-20 NOTE — Progress Notes (Signed)
The patient presented to the office today for her six-week postop visit. She is status post transvaginal hysterectomy with partial right salpingectomy along with diagnostic laparoscopy and laparotomy with cystoscopy secondary to menorrhagia, anemia, fibroid uterus and intraoperative hemorrhage. Patient was doing well is not taking much narcotics for pain. She has no problems with bowel movements or urinary function. Occasional twinges in the right lower abdomen. No vaginal discharge no leakage of urine reported was in good spirits and otherwise doing well.  Pathology report for surgery:  Uterus and cervix, with right fallopian tube  - CERVIX: UNREMARKABLE.  - ENDOMETRIUM: PROLIFERATIVE WITH INTRAUTERINE DEVICE.  - MYOMETRIUM: LEIOMYOMA.  - UTERINE SEROSA: UNREMARKABLE. NO ENDOMETRIOSIS OR MALIGNANCY.  - RIGHT FALLOPIAN TUBE: UNREMARKABLE. NO ENDOMETRIOSIS OR MALIGNANCY.  Exam:  Abdomen: Incisions intact healing well. Abdomen soft nontender no rebound guarding  Pelvic: Urethra Skene was within normal limits  Vagina: Vaginal cuff intact suture material present no unusual discharge or any foul odor.  Bimanual exam vaginal cuff intact no palpable masses or tenderness  Rectal exam: Not done  Assessment/plan: Patient six-week postop status post transvaginal hysterectomy with right partial salpingectomy completely recovered from her surgery. Patient may resume full normal activity. She was mildly keep up with her mammogram at her regular self breast exams and we will see her back in one year or when necessary.

## 2013-07-31 ENCOUNTER — Telehealth: Payer: Self-pay | Admitting: *Deleted

## 2013-07-31 DIAGNOSIS — D649 Anemia, unspecified: Secondary | ICD-10-CM

## 2013-07-31 MED ORDER — LINACLOTIDE 145 MCG PO CAPS: 145.0000 ug | ORAL_CAPSULE | Freq: Every day | ORAL | Status: DC

## 2013-07-31 NOTE — Telephone Encounter (Signed)
LM for pt to call me back KW

## 2013-07-31 NOTE — Telephone Encounter (Signed)
Pt informed and CBC order placed and rx sent to phar

## 2013-07-31 NOTE — Telephone Encounter (Signed)
Pt called she is very constipated. She has been taking 17m of Iron, ferrous gluconate since 1 week after surgery, vaginal hysterectomy July 17. She has been using Duclolax daily with relief at first but it is no longer helping. She is out of work today, was up all night in discomfort due to constipation. She is doing magnesium citrate today. She is asking if there is another type of iron that she could take other than the ferrous gluconate that is less constipating. And she is asking if she could take it every other day. Please advise.

## 2013-07-31 NOTE — Telephone Encounter (Signed)
Tell her to stop taking the iron tablet. Have her come by this week for a CBC. Please call in prescription for Linzess 145 mcg tablet by mouth daily at least 30 minutes before first meal of the day. #35 refills which will help for her chronic constipation.

## 2013-08-02 ENCOUNTER — Other Ambulatory Visit: Payer: BC Managed Care – PPO

## 2013-08-02 DIAGNOSIS — D649 Anemia, unspecified: Secondary | ICD-10-CM

## 2013-08-02 LAB — CBC
HCT: 35.6 % — ABNORMAL LOW (ref 36.0–46.0)
Hemoglobin: 11.4 g/dL — ABNORMAL LOW (ref 12.0–15.0)
MCHC: 32 g/dL (ref 30.0–36.0)
MCV: 77.4 fL — ABNORMAL LOW (ref 78.0–100.0)
RDW: 16.7 % — ABNORMAL HIGH (ref 11.5–15.5)
WBC: 9.6 10*3/uL (ref 4.0–10.5)

## 2013-09-27 ENCOUNTER — Other Ambulatory Visit: Payer: Self-pay

## 2013-10-12 ENCOUNTER — Encounter: Payer: Self-pay | Admitting: Gynecology

## 2013-10-12 ENCOUNTER — Ambulatory Visit (INDEPENDENT_AMBULATORY_CARE_PROVIDER_SITE_OTHER): Payer: BC Managed Care – PPO | Admitting: Gynecology

## 2013-10-12 VITALS — BP 138/88 | Wt 236.0 lb

## 2013-10-12 DIAGNOSIS — R635 Abnormal weight gain: Secondary | ICD-10-CM | POA: Insufficient documentation

## 2013-10-12 DIAGNOSIS — F419 Anxiety disorder, unspecified: Secondary | ICD-10-CM | POA: Insufficient documentation

## 2013-10-12 DIAGNOSIS — M6289 Other specified disorders of muscle: Secondary | ICD-10-CM

## 2013-10-12 DIAGNOSIS — F3289 Other specified depressive episodes: Secondary | ICD-10-CM

## 2013-10-12 DIAGNOSIS — R29898 Other symptoms and signs involving the musculoskeletal system: Secondary | ICD-10-CM

## 2013-10-12 DIAGNOSIS — F329 Major depressive disorder, single episode, unspecified: Secondary | ICD-10-CM

## 2013-10-12 DIAGNOSIS — R609 Edema, unspecified: Secondary | ICD-10-CM | POA: Insufficient documentation

## 2013-10-12 DIAGNOSIS — F32A Depression, unspecified: Secondary | ICD-10-CM

## 2013-10-12 DIAGNOSIS — F411 Generalized anxiety disorder: Secondary | ICD-10-CM

## 2013-10-12 LAB — CBC WITH DIFFERENTIAL/PLATELET
HCT: 35.2 % — ABNORMAL LOW (ref 36.0–46.0)
Hemoglobin: 11.4 g/dL — ABNORMAL LOW (ref 12.0–15.0)
Lymphocytes Relative: 26 % (ref 12–46)
Lymphs Abs: 3.4 10*3/uL (ref 0.7–4.0)
Monocytes Absolute: 1.1 10*3/uL — ABNORMAL HIGH (ref 0.1–1.0)
Monocytes Relative: 8 % (ref 3–12)
Neutro Abs: 8.2 10*3/uL — ABNORMAL HIGH (ref 1.7–7.7)
Neutrophils Relative %: 65 % (ref 43–77)
RBC: 4.57 MIL/uL (ref 3.87–5.11)

## 2013-10-12 LAB — COMPREHENSIVE METABOLIC PANEL
AST: 14 U/L (ref 0–37)
Albumin: 4 g/dL (ref 3.5–5.2)
BUN: 10 mg/dL (ref 6–23)
Calcium: 8.8 mg/dL (ref 8.4–10.5)
Chloride: 102 mEq/L (ref 96–112)
Creat: 0.68 mg/dL (ref 0.50–1.10)
Glucose, Bld: 67 mg/dL — ABNORMAL LOW (ref 70–99)
Potassium: 4.2 mEq/L (ref 3.5–5.3)

## 2013-10-12 NOTE — Progress Notes (Signed)
The patient presented to the office today emotionally distraught. Patient anxious has had issues with depression and was complaining of fluid retention tiredness fatigue and weight gain. Patient stated that she had seen her psychiatrist earlier today and he has her on Viibryd and she also takes Ativan 1 mg when necessary anxiety. Her primary physician has her on Neurontin for her fibromyalgia. She has no suicidal ideation and has good surrounding support. Patient has past history of alcohol abuse and has been through AA and has done well.  Patient was last seen here in the office August 29 for 6 week postop visit she was status post transvaginal hysterectomy partial right salpingectomy and has done well.  Exam: blood pressure 130/88 weight 236 pounds Patient very anxious Heart: Systolic ejection murmur grade 3/6 with second heart sound click noted at the left parasternal border. The patient had no carotid bruits and no thyromegaly. Lungs: Clear to auscultation a [place] or wheezes Extremities: One plus pitting edema  Assessment/plan: #1 abnormal cardiac auscultation noted with a grade 3/6 systolic ejection murmur with second sound click will be referred to the cardiologist for further evaluation. #2 the following labs will be ordered: TSH, FSH, comprehensive metabolic panel, CBC and vitamin D level #3 I recommend she follow up with her primary physician Dr. Tollie Pizza to see if perhaps she can be tapered off her Neurontin that appears to be causing some of the side effects that she is experiencing as noted above.

## 2013-10-13 LAB — TSH: TSH: 1.514 u[IU]/mL (ref 0.350–4.500)

## 2013-10-13 LAB — FOLLICLE STIMULATING HORMONE: FSH: 3.5 m[IU]/mL

## 2013-10-13 LAB — VITAMIN D 25 HYDROXY (VIT D DEFICIENCY, FRACTURES): Vit D, 25-Hydroxy: 37 ng/mL (ref 30–89)

## 2013-10-15 ENCOUNTER — Telehealth: Payer: Self-pay | Admitting: *Deleted

## 2013-10-15 DIAGNOSIS — R5383 Other fatigue: Secondary | ICD-10-CM

## 2013-10-15 DIAGNOSIS — R609 Edema, unspecified: Secondary | ICD-10-CM

## 2013-10-15 DIAGNOSIS — R0602 Shortness of breath: Secondary | ICD-10-CM

## 2013-10-15 DIAGNOSIS — R5381 Other malaise: Secondary | ICD-10-CM

## 2013-10-15 NOTE — Telephone Encounter (Signed)
Appointment 10/16/13 @ 10:15 am with Dr.Kelly. Left message for to call.

## 2013-10-15 NOTE — Telephone Encounter (Signed)
That should be sufficient amount of calcium and vitamin D

## 2013-10-15 NOTE — Telephone Encounter (Signed)
Left the below on pt voicemail. 

## 2013-10-15 NOTE — Telephone Encounter (Signed)
Message copied by Thamas Jaegers on Mon Oct 15, 2013  9:18 AM ------      Message from: Terrance Mass      Created: Fri Oct 12, 2013  3:54 PM       Gloria [name], please schedule appointment with Dr. Ellouise Newer or one of his colleagues for cardio consult. Patient with tiredness, fatique, SOB, and peripheral edema. On Cardiac auscultation patine has a III/VI systolic ejection murmur with click. Thanks.  ------

## 2013-10-15 NOTE — Telephone Encounter (Signed)
Pt informed with the below note. 

## 2013-10-15 NOTE — Telephone Encounter (Signed)
Pt was told to call back and let you know how much her multivitamin she takes which is calcium 600 mg and 800 i/u of vit. D. Per tablet and she takes 1 in am and 1 pm.

## 2013-10-16 ENCOUNTER — Encounter: Payer: Self-pay | Admitting: Cardiovascular Disease

## 2013-10-16 ENCOUNTER — Ambulatory Visit (INDEPENDENT_AMBULATORY_CARE_PROVIDER_SITE_OTHER): Payer: BC Managed Care – PPO | Admitting: Cardiovascular Disease

## 2013-10-16 VITALS — BP 118/82 | HR 86 | Ht 66.0 in | Wt 232.9 lb

## 2013-10-16 DIAGNOSIS — K709 Alcoholic liver disease, unspecified: Secondary | ICD-10-CM

## 2013-10-16 DIAGNOSIS — F418 Other specified anxiety disorders: Secondary | ICD-10-CM

## 2013-10-16 DIAGNOSIS — R011 Cardiac murmur, unspecified: Secondary | ICD-10-CM

## 2013-10-16 DIAGNOSIS — Z13220 Encounter for screening for lipoid disorders: Secondary | ICD-10-CM

## 2013-10-16 DIAGNOSIS — F341 Dysthymic disorder: Secondary | ICD-10-CM

## 2013-10-16 DIAGNOSIS — R0989 Other specified symptoms and signs involving the circulatory and respiratory systems: Secondary | ICD-10-CM

## 2013-10-16 DIAGNOSIS — Z8669 Personal history of other diseases of the nervous system and sense organs: Secondary | ICD-10-CM | POA: Insufficient documentation

## 2013-10-16 DIAGNOSIS — R0683 Snoring: Secondary | ICD-10-CM | POA: Insufficient documentation

## 2013-10-16 DIAGNOSIS — F1011 Alcohol abuse, in remission: Secondary | ICD-10-CM

## 2013-10-16 DIAGNOSIS — R0609 Other forms of dyspnea: Secondary | ICD-10-CM

## 2013-10-16 DIAGNOSIS — R002 Palpitations: Secondary | ICD-10-CM

## 2013-10-16 NOTE — Progress Notes (Signed)
Patient ID: Gloria [name], female   DOB: 11/21/1971, 42 y.o.   MRN: [id number]     PATIENT PROFILE: Gloria [name] is a 42 year old female who is referred for the courtesy of Dr. Uvaldo Rising for evaluation of cardiac murmur.   HPI: Gloria [name] is a 42 year old female who has a long-standing history of depression and also had undergone detoxification for alcohol in the past. At that time, she had significant LFT elevation and was developing cirrhosis.  She  has been dry for several years and ultimately her liver function studies have normalized. Recently, she had undergone a transvaginal hysterectomy with partial right salpingectomy by Dr. Toney Rakes. He had seen her recently for hospital followup evaluation and at that time was concerned of a murmur which he graded as 3/6 and she now presents for evaluation. According to the patient, she had never been previously told of having a cardiac murmur. She denies fever chills or night sweats. Additional problems also include migraine headaches, neuropathy for which she has taken Neurontin. She has had difficulty with sleep and has been taking Lunesta 3 mg daily. Upon further questioning she typically oftentimes wakes up at night gasping for breath. She does snore loudly and at times wakes herself up a cause of her snore. Her sleep is nonrestorative. She does note daytime fatigue the remote, she had been on Lexapro for many years for depression and recently has been treated with vilazodone at 40 mg. Gloria [name] denies any episodes of chest pain. She is unaware of tachycardia or palpitations she denies presyncope or syncope.  Past Medical History  Diagnosis Date  . Smoker   . History of ETOH abuse   . Elevated LFTs   . Fibromyalgia   . Bulging disc     X 2  . Arthritis   . Hyperlipidemia   . Osteoarthritis     lower back    Past Surgical History  Procedure Laterality Date  . Intrauterine device insertion  08/09/2008    PARAGUARD  . Neck  surgery    . Kidney surgery  1995  . Radial tunnel Bilateral 2005  . Vaginal hysterectomy Left 06/06/2013    Procedure: Vaginal Hysterectomy with Patiial Right Salpingectomy;  Surgeon: Terrance Mass, MD;  Location: [facility];  Service: Gynecology;  Laterality: Left;  . Laparoscopy N/A 06/06/2013    Procedure: LAPAROSCOPY DIAGNOSTIC;  Surgeon: Terrance Mass, MD;  Location: [facility];  Service: Gynecology;  Laterality: N/A;  . Laparotomy  06/06/2013    Procedure: EXPLORATORY LAPAROTOMY;  Surgeon: Terrance Mass, MD;  Location: [facility];  Service: Gynecology;;  . Cystoscopy  06/06/2013    Procedure: CYSTOSCOPY;  Surgeon: Terrance Mass, MD;  Location: [facility];  Service: Gynecology;;    Allergies  Allergen Reactions  . Benadryl [Diphenhydramine Hcl] Hives    Current Outpatient Prescriptions  Medication Sig Dispense Refill  . B Complex Vitamins (B COMPLEX PO) Take 1 tablet by mouth daily.      . Calcium Carbonate-Vitamin D (CALCIUM 600/VITAMIN D) 600-400 MG-UNIT per tablet Take 1 tablet by mouth daily.      . cetirizine (ZYRTEC) 10 MG tablet Take 10 mg by mouth daily.        . Coenzyme Q10 (COQ10) 100 MG CAPS Take 100 mg by mouth daily.      [name] esomeprazole (NEXIUM) 40 MG capsule Take 40 mg by mouth daily before breakfast.      . Eszopiclone (ESZOPICLONE) 3 MG TABS Take 3 mg by  mouth at bedtime. Take immediately before bedtime      . gabapentin (NEURONTIN) 800 MG tablet Take 800 mg by mouth 3 (three) times daily.       . hydrOXYzine (ATARAX/VISTARIL) 25 MG tablet Take 25 mg by mouth daily as needed for anxiety.       [name] ibuprofen (ADVIL,MOTRIN) 800 MG tablet Take 1 tablet (800 mg total) by mouth every 8 (eight) hours as needed for pain.  30 tablet  2  . LORazepam (ATIVAN) 1 MG tablet Take 1 mg by mouth daily as needed for anxiety.       . magnesium gluconate (MAGONATE) 500 MG tablet Take 500 mg by mouth 3 (three) times daily.      . montelukast (SINGULAIR) 10 MG tablet Take 10 mg by mouth at  bedtime.        . Multiple Vitamin (MULTIVITAMIN WITH MINERALS) TABS Take 1 tablet by mouth daily.      [name] PROAIR HFA 108 (90 BASE) MCG/ACT inhaler Inhale 1 puff into the lungs as needed.      . rizatriptan (MAXALT) 10 MG tablet Take 10 mg by mouth as needed for migraine. May repeat in 2 hours if needed      . thiamine (VITAMIN B-1) 50 MG tablet Take 50 mg by mouth daily.      . Vilazodone HCl (VIIBRYD) 40 MG TABS Take 40 mg by mouth every evening.       . vitamin C (ASCORBIC ACID) 500 MG tablet Take 500 mg by mouth daily.       No current facility-administered medications for this visit.    Social history is notable in that she is separated. She has one child. 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 She does not routinely exercise.  Family History  Problem Relation Age of Onset  . Breast cancer Mother   . Breast cancer Maternal Aunt   . Breast cancer Paternal Grandmother     COLON  . Cancer Paternal Grandfather     THROAT .[name]    ROS is negative for fever chills or night sweats. She denies skin rash. She denies hearing difficulties. In the past she was told of possibly having fibromyalgia. She does have migraine headaches. She denies shortness of breath. Previously she was never told of having a heart murmur. She denies palpitations, presyncope or syncope. She denies abdominal pain. She is status post recent hysterectomy with her ovaries intact. She has 3 plates in her neck following neck surgery. She is also status post remote kidney surgery. She denies claudication. She denies diabetes. There is no cold or heat intolerance. She denies seizures. She does have history of depression. From a sleep perspective, she snores. She wakes up several times per night. She has been awakening gasping for breath. Her sleep is nonrestorative. At times she does note some daytime fatigue. She denies hypnagogic hallucinations. There is no cataplexy. She is unaware of bruxism. Other  comprehensive 14 point system review is negative.  PE BP 118/82  Pulse 86  Ht 5' 6"  (1.676 m)  Wt 232 lb 14.4 oz (105.643 kg)  BMI 37.61 kg/m2  LMP 04/25/2013 General: Alert, oriented, no distress. Moderate obesity Skin: normal turgor, no rashes HEENT: Normocephalic, atraumatic. Pupils round and reactive; sclera anicteric; Fundi without hemorrhages or exudates Nose without nasal septal hypertrophy Mouth/Parynx benign; Mallinpatti scale 3 Neck: No JVD, no carotid bruits; thick neck Lungs: clear to ausculatation and percussion; no wheezing or  rales Heart: RRR, s1 s2 normal 2/6 systolic murmur along the left lower sternal border and slightly more prominent at the apex suggesting mitral regurgitation Abdomen: soft, nontender; no hepatosplenomehaly, BS+; abdominal aorta nontender and not dilated by palpation. Pulses 2+ Extremities: no clubbinbg cyanosis or edema, Homan's sign negative  Neurologic: grossly nonfocal Psychologic: Normal mood and affect    ECG: Sinus rhythm 86 beats per minute. Nonspecific ST changes.  LABS:  BMET    Component Value Date/Time   NA 137 10/12/2013 1558   K 4.2 10/12/2013 1558   CL 102 10/12/2013 1558   CO2 29 10/12/2013 1558   GLUCOSE 67* 10/12/2013 1558   BUN 10 10/12/2013 1558   CREATININE 0.68 10/12/2013 1558   CREATININE 0.59 06/07/2013 0530   CALCIUM 8.8 10/12/2013 1558   GFRNONAA >90 06/07/2013 0530   GFRAA >90 06/07/2013 0530     Hepatic Function Panel     Component Value Date/Time   PROT 6.7 10/12/2013 1558   ALBUMIN 4.0 10/12/2013 1558   AST 14 10/12/2013 1558   ALT 14 10/12/2013 1558   ALKPHOS 68 10/12/2013 1558   BILITOT 0.2* 10/12/2013 1558     CBC    Component Value Date/Time   WBC 12.8* 10/12/2013 1558   RBC 4.57 10/12/2013 1558   HGB 11.4* 10/12/2013 1558   HCT 35.2* 10/12/2013 1558   PLT 343 10/12/2013 1558   MCV 77.0* 10/12/2013 1558   MCH 24.9* 10/12/2013 1558   MCHC 32.4 10/12/2013 1558   RDW 16.1* 10/12/2013  1558   LYMPHSABS 3.4 10/12/2013 1558   MONOABS 1.1* 10/12/2013 1558   EOSABS 0.2 10/12/2013 1558   BASOSABS 0.0 10/12/2013 1558     BNP No results found for this basename: probnp    Lipid Panel     Component Value Date/Time   CHOL 205* 11/30/2012 1505   TRIG 280* 11/30/2012 1505   HDL 39* 11/30/2012 1505   CHOLHDL 5.3 11/30/2012 1505   VLDL 56* 11/30/2012 1505   LDLCALC 110* 11/30/2012 1505     RADIOLOGY: No results found.   ASSESSMENT AND PLAN: My impression is that Gloria [name] is a 42 year old female who is status post recent hysterectomy procedure done by Dr. Toney Rakes. The patient has a history of depression for which she's been on antidepressant medication, as well as a history of migraine headache. She does have a at least a 2/6 cardiac murmur suggestive of possible mitral regurgitation be most prominent at the apex. I am scheduling her to undergo a 2-D echo Doppler study for further evaluation of this murmur. I am also concerned that she may very well have obstructive sleep apnea and scheduling her for a diagnostic polysomnogram. I did review the recent laboratory done by Dr. Toney Rakes. I will also check additional laboratory consisting of a comprehensive metabolic panel, lipid panel, TSH, and vitamin D level. I will see her back in the office in followup of the above studies and further recommendations will be made at that time.   Troy Sine, MD, [facility] 10/16/2013 7:24 PM

## 2013-10-16 NOTE — Patient Instructions (Signed)
Your physician has requested that you have an echocardiogram. Echocardiography is a painless test that uses sound waves to create images of your heart. It provides your doctor with information about the size and shape of your heart and how well your heart's chambers and valves are working. This procedure takes approximately one hour. There are no restrictions for this procedure.  Your physician has recommended that you have a sleep study. This test records several body functions during sleep, including: brain activity, eye movement, oxygen and carbon dioxide blood levels, heart rate and rhythm, breathing rate and rhythm, the flow of air through your mouth and nose, snoring, body muscle movements, and chest and belly movement.  Your physician recommends that you return for lab work fasting.  Your physician recommends that you schedule a follow-up appointment in: 6 WEEKS.

## 2013-10-17 LAB — COMPREHENSIVE METABOLIC PANEL
ALT: 11 U/L (ref 0–35)
AST: 14 U/L (ref 0–37)
Albumin: 4.1 g/dL (ref 3.5–5.2)
Alkaline Phosphatase: 65 U/L (ref 39–117)
Creat: 0.61 mg/dL (ref 0.50–1.10)
Glucose, Bld: 105 mg/dL — ABNORMAL HIGH (ref 70–99)
Potassium: 4.5 mEq/L (ref 3.5–5.3)
Sodium: 137 mEq/L (ref 135–145)
Total Protein: 7 g/dL (ref 6.0–8.3)

## 2013-10-17 LAB — LIPID PANEL
Cholesterol: 206 mg/dL — ABNORMAL HIGH (ref 0–200)
LDL Cholesterol: 123 mg/dL — ABNORMAL HIGH (ref 0–99)
Triglycerides: 201 mg/dL — ABNORMAL HIGH (ref <150)

## 2013-10-17 LAB — TSH: TSH: 2.058 u[IU]/mL (ref 0.350–4.500)

## 2013-10-18 LAB — VITAMIN D 25 HYDROXY (VIT D DEFICIENCY, FRACTURES): Vit D, 25-Hydroxy: 38 ng/mL (ref 30–89)

## 2013-10-23 ENCOUNTER — Encounter: Payer: Self-pay | Admitting: Cardiovascular Disease

## 2013-11-01 ENCOUNTER — Ambulatory Visit (HOSPITAL_COMMUNITY)
Admission: RE | Admit: 2013-11-01 | Discharge: 2013-11-01 | Disposition: A | Discharge status: Home / Self Care | Payer: BC Managed Care – PPO | Source: Ambulatory Visit | Attending: Internal Medicine | Admitting: Internal Medicine

## 2013-11-01 DIAGNOSIS — R002 Palpitations: Secondary | ICD-10-CM | POA: Insufficient documentation

## 2013-11-01 DIAGNOSIS — R011 Cardiac murmur, unspecified: Secondary | ICD-10-CM | POA: Insufficient documentation

## 2013-11-01 NOTE — Progress Notes (Signed)
2D Echo Performed 11/01/2013    Marygrace Drought, RCS

## 2013-11-20 ENCOUNTER — Ambulatory Visit (INDEPENDENT_AMBULATORY_CARE_PROVIDER_SITE_OTHER): Payer: BC Managed Care – PPO | Admitting: Cardiovascular Disease

## 2013-11-20 ENCOUNTER — Encounter: Payer: Self-pay | Admitting: Cardiovascular Disease

## 2013-11-20 VITALS — BP 122/94 | HR 75 | Ht 66.0 in | Wt 239.2 lb

## 2013-11-20 DIAGNOSIS — R0602 Shortness of breath: Secondary | ICD-10-CM

## 2013-11-20 DIAGNOSIS — F419 Anxiety disorder, unspecified: Secondary | ICD-10-CM

## 2013-11-20 DIAGNOSIS — G2581 Restless legs syndrome: Secondary | ICD-10-CM

## 2013-11-20 DIAGNOSIS — F341 Dysthymic disorder: Secondary | ICD-10-CM

## 2013-11-20 DIAGNOSIS — R0989 Other specified symptoms and signs involving the circulatory and respiratory systems: Secondary | ICD-10-CM

## 2013-11-20 DIAGNOSIS — Z8669 Personal history of other diseases of the nervous system and sense organs: Secondary | ICD-10-CM

## 2013-11-20 DIAGNOSIS — R0683 Snoring: Secondary | ICD-10-CM

## 2013-11-20 DIAGNOSIS — R0609 Other forms of dyspnea: Secondary | ICD-10-CM

## 2013-11-20 DIAGNOSIS — F418 Other specified anxiety disorders: Secondary | ICD-10-CM

## 2013-11-20 DIAGNOSIS — F411 Generalized anxiety disorder: Secondary | ICD-10-CM

## 2013-11-20 DIAGNOSIS — R011 Cardiac murmur, unspecified: Secondary | ICD-10-CM

## 2013-11-20 MED ORDER — ROPINIROLE HCL 0.25 MG PO TABS: ORAL_TABLET | ORAL | Status: DC

## 2013-11-20 MED ORDER — ROPINIROLE HCL 1 MG PO TABS: 1.0000 mg | ORAL_TABLET | Freq: Every day | ORAL | Status: DC

## 2013-11-20 NOTE — Progress Notes (Signed)
Patient ID: Lalani Winkles, female   DOB: Apr 12, 1971, 42 y.o.   MRN: [id number]      PATIENT PROFILE: Ms. Jannis Atkins is a 42 year old female who I initially saw through courtesy of Dr. Uvaldo Rising for evaluation of cardiac murmur. She now presents for followup cardiology evaluation.   HPI: Ms. Moman is a 42 year old female who has a long-standing history of depression and also had undergone detoxification for alcohol in the past. At that time, she had significant LFT elevation and was developing cirrhosis.  She  has been dry for several years and ultimately her liver function studies have normalized. Recently, she had undergone a transvaginal hysterectomy with partial right salpingectomy by Dr. Toney Rakes. He had seen her recently for hospital followup evaluation and at that time was concerned of a murmur which he graded as 3/6 and she now presents for evaluation. According to the patient, she had never been previously told of having a cardiac murmur.   Additional problems also include migraine headaches, neuropathy for which she has taken Neurontin. She has had difficulty with sleep and has been taking Lunesta 3 mg daily. Upon further questioning she typically oftentimes wakes up at night gasping for breath. She does snore loudly and at times wakes herself up a cause of her snore. Her sleep is nonrestorative. She does note daytime fatigue the remote, she had been on Lexapro for many years for depression and recently has been treated with vilazodone at 40 mg. Ms. Rey denies any episodes of chest pain. She is unaware of tachycardia or palpitations she denies presyncope or syncope.  Ms. Vena Austria will underwent a 2-D echo Doppler study on 11/01/2013. This showed normal left ventricular size and function. She had normal diastolic function. She did have increased atrial septal thickness consistent with lipomatous hypertrophy. There was just revealed tricuspid regurgitation. There was no evidence for  significant valvular pathology.  Because of difficulty with sleep, she underwent a diagnostic polysomnogram. She was not found to have obstructive sleep apnea. HI was 0. Her, her RDI was 5.9. Of note, she did have frequent periodic leg movements at 257 with an index of 16 and several lateral arousals per was felt most likely that she may very well have restless leg syndrome or which may be disrupting her sleep pattern rather than obstructive sleep apnea.  Past Medical History  Diagnosis Date  . Smoker   . History of ETOH abuse   . Elevated LFTs   . Fibromyalgia   . Bulging disc     X 2  . Arthritis   . Hyperlipidemia   . Osteoarthritis     lower back    Past Surgical History  Procedure Laterality Date  . Intrauterine device insertion  08/09/2008    PARAGUARD  . Neck surgery    . Kidney surgery  1995  . Radial tunnel Bilateral 2005  . Vaginal hysterectomy Left 06/06/2013    Procedure: Vaginal Hysterectomy with Patiial Right Salpingectomy;  Surgeon: Terrance Mass, MD;  Location: [facility];  Service: Gynecology;  Laterality: Left;  . Laparoscopy N/A 06/06/2013    Procedure: LAPAROSCOPY DIAGNOSTIC;  Surgeon: Terrance Mass, MD;  Location: [facility];  Service: Gynecology;  Laterality: N/A;  . Laparotomy  06/06/2013    Procedure: EXPLORATORY LAPAROTOMY;  Surgeon: Terrance Mass, MD;  Location: [facility];  Service: Gynecology;;  . Cystoscopy  06/06/2013    Procedure: CYSTOSCOPY;  Surgeon: Terrance Mass, MD;  Location: [facility];  Service: Gynecology;;  Allergies  Allergen Reactions  . Benadryl [Diphenhydramine Hcl] Hives    Current Outpatient Prescriptions  Medication Sig Dispense Refill  . B Complex Vitamins (B COMPLEX PO) Take 1 tablet by mouth daily.      . Calcium Carbonate-Vitamin D (CALCIUM 600/VITAMIN D) 600-400 MG-UNIT per tablet Take 1 tablet by mouth daily.      . cetirizine (ZYRTEC) 10 MG tablet Take 10 mg by mouth daily.        . Coenzyme Q10 (COQ10) 100 MG CAPS Take 100  mg by mouth daily.      . cyclobenzaprine (FLEXERIL) 10 MG tablet Take 10 mg by mouth as needed.       [name] esomeprazole (NEXIUM) 40 MG capsule Take 40 mg by mouth daily before breakfast.      . Eszopiclone (ESZOPICLONE) 3 MG TABS Take 3 mg by mouth at bedtime. Take immediately before bedtime      . gabapentin (NEURONTIN) 800 MG tablet Take 800 mg by mouth 3 (three) times daily.       . hydrOXYzine (ATARAX/VISTARIL) 25 MG tablet Take 25 mg by mouth daily as needed for anxiety.       [name] ibuprofen (ADVIL,MOTRIN) 800 MG tablet Take 1 tablet (800 mg total) by mouth every 8 (eight) hours as needed for pain.  30 tablet  2  . LORazepam (ATIVAN) 1 MG tablet Take 1 mg by mouth daily as needed for anxiety.       . magnesium gluconate (MAGONATE) 500 MG tablet Take 500 mg by mouth 3 (three) times daily.      . montelukast (SINGULAIR) 10 MG tablet Take 10 mg by mouth at bedtime.        . Multiple Vitamin (MULTIVITAMIN WITH MINERALS) TABS Take 1 tablet by mouth daily.      [name] PROAIR HFA 108 (90 BASE) MCG/ACT inhaler Inhale 1 puff into the lungs as needed.      . rizatriptan (MAXALT) 10 MG tablet Take 10 mg by mouth as needed for migraine. May repeat in 2 hours if needed      . thiamine (VITAMIN B-1) 50 MG tablet Take 50 mg by mouth daily.      . Vilazodone HCl (VIIBRYD) 40 MG TABS Take 40 mg by mouth every evening.       . vitamin C (ASCORBIC ACID) 500 MG tablet Take 500 mg by mouth daily.       No current facility-administered medications for this visit.    Social history is notable in that she is separated. She has one child. 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 She does not routinely exercise.  Family History  Problem Relation Age of Onset  . Breast cancer Mother   . Breast cancer Maternal Aunt   . Breast cancer Paternal Grandmother     COLON  . Cancer Paternal Grandfather     THROAT .[name]    ROS is negative for fever chills or night sweats. She denies skin rash. She  denies hearing difficulties. In the past she was told of possibly having fibromyalgia. She does have migraine headaches. She denies shortness of breath.  She denies palpitations, presyncope or syncope.  She denies chest pain. She denies abdominal pain. She is status post recent hysterectomy with her ovaries intact. She has 3 plates in her neck following neck surgery. She is also status post remote kidney surgery. She denies claudication. She denies diabetes. There is no cold or heat intolerance.  She denies seizures. She does have history of depression. From a sleep perspective, she snores. She wakes up several times per night. She has been awakening gasping for breath. Her sleep is nonrestorative. At times she does note some daytime fatigue. She denies hypnagogic hallucinations. There is no cataplexy. She is unaware of bruxism. She is aware of frequent leg movements  Other comprehensive 14 point system review is negative.  PE BP 122/94  Pulse 75  Ht 5' 6"  (1.676 m)  Wt 239 lb 3.2 oz (108.5 kg)  BMI 38.63 kg/m2  LMP 04/25/2013 General: Alert, oriented, no distress. Moderate obesity Skin: normal turgor, no rashes HEENT: Normocephalic, atraumatic. Pupils round and reactive; sclera anicteric; Fundi without hemorrhages or exudates Nose without nasal septal hypertrophy Mouth/Parynx benign; Mallinpatti scale 3 Neck: No JVD, no carotid bruits; thick neck Lungs: clear to ausculatation and percussion; no wheezing or rales Chest wall: No tenderness to palpate Heart: RRR, s1 s2 normal 9-9/2 systolic murmur along the left lower sternal border and slightly more prominent at the apex suggesting mitral regurgitation Abdomen: soft, nontender; no hepatosplenomehaly, BS+; abdominal aorta nontender and not dilated by palpation. Back: No CVA tenderness  Pulses 2+ Extremities: no clubbinbg cyanosis or edema, Homan's sign negative  Neurologic: grossly nonfocal Psychologic: Normal mood and affect    ECG  independently read by me reveals normal sinus rhythm at 75 beats per minute. QTc  453 ms. No significant ST changes.  Prior ECG of 10/16/2013: Sinus rhythm 86 beats per minute. Nonspecific ST changes.  LABS:  BMET    Component Value Date/Time   NA 137 10/17/2013 0942   K 4.5 10/17/2013 0942   CL 104 10/17/2013 0942   CO2 25 10/17/2013 0942   GLUCOSE 105* 10/17/2013 0942   BUN 11 10/17/2013 0942   CREATININE 0.61 10/17/2013 0942   CREATININE 0.59 06/07/2013 0530   CALCIUM 9.0 10/17/2013 0942   GFRNONAA >90 06/07/2013 0530   GFRAA >90 06/07/2013 0530     Hepatic Function Panel     Component Value Date/Time   PROT 7.0 10/17/2013 0942   ALBUMIN 4.1 10/17/2013 0942   AST 14 10/17/2013 0942   ALT 11 10/17/2013 0942   ALKPHOS 65 10/17/2013 0942   BILITOT 0.3 10/17/2013 0942     CBC    Component Value Date/Time   WBC 12.8* 10/12/2013 1558   RBC 4.57 10/12/2013 1558   HGB 11.4* 10/12/2013 1558   HCT 35.2* 10/12/2013 1558   PLT 343 10/12/2013 1558   MCV 77.0* 10/12/2013 1558   MCH 24.9* 10/12/2013 1558   MCHC 32.4 10/12/2013 1558   RDW 16.1* 10/12/2013 1558   LYMPHSABS 3.4 10/12/2013 1558   MONOABS 1.1* 10/12/2013 1558   EOSABS 0.2 10/12/2013 1558   BASOSABS 0.0 10/12/2013 1558     BNP No results found for this basename: probnp    Lipid Panel     Component Value Date/Time   CHOL 206* 10/17/2013 0942   TRIG 201* 10/17/2013 0942   HDL 43 10/17/2013 0942   CHOLHDL 4.8 10/17/2013 0942   VLDL 40 10/17/2013 0942   LDLCALC 123* 10/17/2013 0942     RADIOLOGY: No results found.   ASSESSMENT AND PLAN: My impression is that Ms. Dnya Hickle is a 42 year old female who is status post recent hysterectomy procedure done by Dr. Toney Rakes. Shevhas a history of depression for which she's been on antidepressant medication, as well as a history of migraine headache. She does have a at least a 2/6 cardiac  murmur suggestive of possible mitral regurgitation be most prominent  at the apex. I am scheduling her to undergo a 2-D echo Doppler study for further evaluation of this murmur. I am also concerned that she may very well have obstructive sleep apnea and scheduling her for a diagnostic polysomnogram. I did review the recent laboratory done by Dr. Toney Rakes. I will also check additional laboratory consisting of a comprehensive metabolic panel, lipid panel, TSH, and vitamin D level. I will see her back in the office in followup of the above studies and further recommendations will be made at that time.   Troy Sine, MD, [facility] 11/20/2013 2:58 PM Patient ID: Joline Salt, female   DOB: [birth year], 42 y.o.   MRN: [id number]     PATIENT PROFILE: Ms. Anikah Hogge is a 42 year old female who is referred for the courtesy of Dr. Uvaldo Rising for evaluation of cardiac murmur.   HPI: Ms. Mandile is a 42 year old female who has a long-standing history of depression and also had undergone detoxification for alcohol in the past. At that time, she had significant LFT elevation and was developing cirrhosis.  She  has been dry for several years and ultimately her liver function studies have normalized. Recently, she had undergone a transvaginal hysterectomy with partial right salpingectomy by Dr. Toney Rakes. He had seen her recently for hospital followup evaluation and at that time was concerned of a murmur which he graded as 3/6 and she now presents for evaluation. According to the patient, she had never been previously told of having a cardiac murmur. She denies fever chills or night sweats. Additional problems also include migraine headaches, neuropathy for which she has taken Neurontin. She has had difficulty with sleep and has been taking Lunesta 3 mg daily. Upon further questioning she typically oftentimes wakes up at night gasping for breath. She does snore loudly and at times wakes herself up a cause of her snore. Her sleep is nonrestorative. She does note daytime fatigue the  remote, she had been on Lexapro for many years for depression and recently has been treated with vilazodone at 40 mg. Ms. Bertagnolli denies any episodes of chest pain. She is unaware of tachycardia or palpitations she denies presyncope or syncope.  Past Medical History  Diagnosis Date  . Smoker   . History of ETOH abuse   . Elevated LFTs   . Fibromyalgia   . Bulging disc     X 2  . Arthritis   . Hyperlipidemia   . Osteoarthritis     lower back    Past Surgical History  Procedure Laterality Date  . Intrauterine device insertion  08/09/2008    PARAGUARD  . Neck surgery    . Kidney surgery  1995  . Radial tunnel Bilateral 2005  . Vaginal hysterectomy Left 06/06/2013    Procedure: Vaginal Hysterectomy with Patiial Right Salpingectomy;  Surgeon: Terrance Mass, MD;  Location: [facility];  Service: Gynecology;  Laterality: Left;  . Laparoscopy N/A 06/06/2013    Procedure: LAPAROSCOPY DIAGNOSTIC;  Surgeon: Terrance Mass, MD;  Location: [facility];  Service: Gynecology;  Laterality: N/A;  . Laparotomy  06/06/2013    Procedure: EXPLORATORY LAPAROTOMY;  Surgeon: Terrance Mass, MD;  Location: [facility];  Service: Gynecology;;  . Cystoscopy  06/06/2013    Procedure: CYSTOSCOPY;  Surgeon: Terrance Mass, MD;  Location: [facility];  Service: Gynecology;;    Allergies  Allergen Reactions  . Benadryl [Diphenhydramine Hcl] Hives    Current Outpatient Prescriptions  Medication Sig Dispense Refill  . B Complex Vitamins (B COMPLEX PO) Take 1 tablet by mouth daily.      . Calcium Carbonate-Vitamin D (CALCIUM 600/VITAMIN D) 600-400 MG-UNIT per tablet Take 1 tablet by mouth daily.      . cetirizine (ZYRTEC) 10 MG tablet Take 10 mg by mouth daily.        . Coenzyme Q10 (COQ10) 100 MG CAPS Take 100 mg by mouth daily.      . cyclobenzaprine (FLEXERIL) 10 MG tablet Take 10 mg by mouth as needed.       [name] esomeprazole (NEXIUM) 40 MG capsule Take 40 mg by mouth daily before breakfast.      . Eszopiclone  (ESZOPICLONE) 3 MG TABS Take 3 mg by mouth at bedtime. Take immediately before bedtime      . gabapentin (NEURONTIN) 800 MG tablet Take 800 mg by mouth 3 (three) times daily.       . hydrOXYzine (ATARAX/VISTARIL) 25 MG tablet Take 25 mg by mouth daily as needed for anxiety.       [name] ibuprofen (ADVIL,MOTRIN) 800 MG tablet Take 1 tablet (800 mg total) by mouth every 8 (eight) hours as needed for pain.  30 tablet  2  . LORazepam (ATIVAN) 1 MG tablet Take 1 mg by mouth daily as needed for anxiety.       . magnesium gluconate (MAGONATE) 500 MG tablet Take 500 mg by mouth 3 (three) times daily.      . montelukast (SINGULAIR) 10 MG tablet Take 10 mg by mouth at bedtime.        . Multiple Vitamin (MULTIVITAMIN WITH MINERALS) TABS Take 1 tablet by mouth daily.      [name] PROAIR HFA 108 (90 BASE) MCG/ACT inhaler Inhale 1 puff into the lungs as needed.      . rizatriptan (MAXALT) 10 MG tablet Take 10 mg by mouth as needed for migraine. May repeat in 2 hours if needed      . thiamine (VITAMIN B-1) 50 MG tablet Take 50 mg by mouth daily.      . Vilazodone HCl (VIIBRYD) 40 MG TABS Take 40 mg by mouth every evening.       . vitamin C (ASCORBIC ACID) 500 MG tablet Take 500 mg by mouth daily.      [name] rOPINIRole (REQUIP) 0.25 MG tablet Take one tablet at bedtime for two days, then take two tablets at bedtime for 5 days, then take 4 tablet at bedtime for the next 3 weeks.  Then start the 91m tablets nightly.  40 tablet  0  . rOPINIRole (REQUIP) 1 MG tablet Take 1 tablet (1 mg total) by mouth at bedtime.  30 tablet  5   No current facility-administered medications for this visit.    Social history is notable in that she is separated. She has one child. 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 She does not routinely exercise.  Family History  Problem Relation Age of Onset  . Breast cancer Mother   . Breast cancer Maternal Aunt   . Breast cancer Paternal Grandmother     COLON  . Cancer  Paternal Grandfather     THROAT ..[name]   ROS is negative for fever chills or night sweats. She denies skin rash. She denies hearing difficulties. In the past she was told of possibly having fibromyalgia. She does have migraine headaches. She denies shortness of breath. Previously she was never told  of having a heart murmur. She denies palpitations, presyncope or syncope. She denies abdominal pain. She is status post recent hysterectomy with her ovaries intact. She has 3 plates in her neck following neck surgery. She is also status post remote kidney surgery. She denies claudication. She denies diabetes. There is no cold or heat intolerance. She denies seizures. She does have history of depression. From a sleep perspective, she snores. She wakes up several times per night. She has been awakening gasping for breath. Her sleep is nonrestorative. At times she does note some daytime fatigue. She denies hypnagogic hallucinations. There is no cataplexy. She is unaware of bruxism. Other comprehensive 14 point system review is negative.  PE BP 122/94  Pulse 75  Ht 5' 6"  (1.676 m)  Wt 239 lb 3.2 oz (108.5 kg)  BMI 38.63 kg/m2  LMP 04/25/2013 General: Alert, oriented, no distress. Moderate obesity Skin: normal turgor, no rashes HEENT: Normocephalic, atraumatic. Pupils round and reactive; sclera anicteric; Fundi without hemorrhages or exudates Nose without nasal septal hypertrophy Mouth/Parynx benign; Mallinpatti scale 3 Neck: No JVD, no carotid bruits; thick neck Lungs: clear to ausculatation and percussion; no wheezing or rales Heart: RRR, s1 s2 normal 2/6 systolic murmur along the left lower sternal border and slightly more prominent at the apex suggesting mitral regurgitation Abdomen: soft, nontender; no hepatosplenomehaly, BS+; abdominal aorta nontender and not dilated by palpation. Pulses 2+ Extremities: no clubbinbg cyanosis or edema, Homan's sign negative  Neurologic: grossly nonfocal Psychologic:  Normal mood and affect    ECG: Sinus rhythm 86 beats per minute. Nonspecific ST changes.  LABS:  BMET    Component Value Date/Time   NA 137 10/17/2013 0942   K 4.5 10/17/2013 0942   CL 104 10/17/2013 0942   CO2 25 10/17/2013 0942   GLUCOSE 105* 10/17/2013 0942   BUN 11 10/17/2013 0942   CREATININE 0.61 10/17/2013 0942   CREATININE 0.59 06/07/2013 0530   CALCIUM 9.0 10/17/2013 0942   GFRNONAA >90 06/07/2013 0530   GFRAA >90 06/07/2013 0530     Hepatic Function Panel     Component Value Date/Time   PROT 7.0 10/17/2013 0942   ALBUMIN 4.1 10/17/2013 0942   AST 14 10/17/2013 0942   ALT 11 10/17/2013 0942   ALKPHOS 65 10/17/2013 0942   BILITOT 0.3 10/17/2013 0942     CBC    Component Value Date/Time   WBC 12.8* 10/12/2013 1558   RBC 4.57 10/12/2013 1558   HGB 11.4* 10/12/2013 1558   HCT 35.2* 10/12/2013 1558   PLT 343 10/12/2013 1558   MCV 77.0* 10/12/2013 1558   MCH 24.9* 10/12/2013 1558   MCHC 32.4 10/12/2013 1558   RDW 16.1* 10/12/2013 1558   LYMPHSABS 3.4 10/12/2013 1558   MONOABS 1.1* 10/12/2013 1558   EOSABS 0.2 10/12/2013 1558   BASOSABS 0.0 10/12/2013 1558     BNP No results found for this basename: probnp    Lipid Panel     Component Value Date/Time   CHOL 206* 10/17/2013 0942   TRIG 201* 10/17/2013 0942   HDL 43 10/17/2013 0942   CHOLHDL 4.8 10/17/2013 0942   VLDL 40 10/17/2013 0942   LDLCALC 123* 10/17/2013 0942     RADIOLOGY: No results found.   ASSESSMENT AND PLAN: My impression is that Ms. Jakiya Bookbinder is a 43 year old female who is status post recent hysterectomy procedure done by Dr. Toney Rakes. The patient has a history of depression for which she's been on antidepressant medication, as well as a  history of migraine headache. She does have a at least a 1-2/6 cardiac murmur suggestive of possible mitral regurgitation be most prominent at the apex. I reviewed her most recent echo Doppler study in detail. This revealed normal systolic  and diastolic function. There was no evidence for hypertrophic myopathy. She did not have significant valvular pathology. She had normal LV size and function. I reviewed her most recent laboratory. Her glucose was 105. She had normal renal function with a BUN of 11 creatinine 0.61. Liver function studies were normal in this patient who previously had markedly abnormal liver function abnormalities due to her significant previous EtOH intake. Thyroid function studies are normal with a TSH 2.05.  Vitamin D level was also normal at 38. Total cholesterol was mildly increased at 206 as were her triglycerides are 201. HDL was 43. LDL was 123. We discussed reduction in glycemic indices of food and diet. I also reviewed her sleep study. She may very well have some slight increased upper airway resistance the contributing to her snoring and slightly elevated RDI, but she did not have significant obstructive sleep apnea. She did have significant frequent periodic leg movement disorder of sleep which may be contributing to some of her disruptive sleep pattern. I am electing to initiate a trial of requip and she will start 0.25 mg 2 days, then increase this to 0.5 mg for 5 days, and then titrate this to 1 mg. She will take this one to 3 hours prior to going to bed. I will see her back in the office in 2-3 months for followup evaluation and further recommendations will be made at that time.    Troy Sine, MD, [facility] 12/15/2013 2:14 PM

## 2013-11-20 NOTE — Patient Instructions (Signed)
START Requip .28m x 2 nights, then .556mnightly x 5 nights, then switch to the 44m60mablets and take one nightly.  Your physician recommends that you schedule a follow-up appointment in: 2 MONTHS.

## 2013-11-30 ENCOUNTER — Encounter: Payer: Self-pay | Admitting: Cardiovascular Disease

## 2013-11-30 NOTE — Telephone Encounter (Signed)
Message forwarded to Dr. Arnette Norris, CMA.

## 2013-12-06 NOTE — Telephone Encounter (Signed)
This was addressed by Erasmo Downer, PharmD on 1.14.15 at 3:12pm.

## 2013-12-15 ENCOUNTER — Encounter: Payer: Self-pay | Admitting: Cardiovascular Disease

## 2013-12-15 DIAGNOSIS — G2581 Restless legs syndrome: Secondary | ICD-10-CM | POA: Insufficient documentation

## 2013-12-17 NOTE — Telephone Encounter (Signed)
Message forwarded to K. Alvstad, PharmD.

## 2013-12-19 MED ORDER — PRAMIPEXOLE DIHYDROCHLORIDE 0.125 MG PO TABS: ORAL_TABLET | ORAL | Status: DC

## 2013-12-19 NOTE — Addendum Note (Signed)
Addended by: Rockne Menghini on: 12/19/2013 07:58 AM   Modules accepted: Orders

## 2013-12-21 ENCOUNTER — Telehealth: Payer: Self-pay | Admitting: *Deleted

## 2013-12-21 NOTE — Telephone Encounter (Signed)
Pt was calling in regards to her new medication (Mirapex). She is having severe depression from this medication. She did not go to work yesterday. She has not taken another one since then. She asked to speak to you personally.

## 2013-12-21 NOTE — Telephone Encounter (Signed)
Explained to patient that we have no other prescription options to help with RLS.  She is currently taking magnesium tid, calcium bid, gabapentin 825m tid.  Her last CMP in November showed K level wnl.  Pt thanked [place] trying.

## 2014-01-14 NOTE — Telephone Encounter (Signed)
Message forwarded to K. Alvstad, PharmD.

## 2014-01-21 ENCOUNTER — Ambulatory Visit (INDEPENDENT_AMBULATORY_CARE_PROVIDER_SITE_OTHER): Payer: BC Managed Care – PPO | Admitting: Cardiovascular Disease

## 2014-01-21 VITALS — BP 116/80 | HR 92 | Ht 66.0 in | Wt 244.9 lb

## 2014-01-21 DIAGNOSIS — G2581 Restless legs syndrome: Secondary | ICD-10-CM

## 2014-01-21 DIAGNOSIS — F329 Major depressive disorder, single episode, unspecified: Secondary | ICD-10-CM

## 2014-01-21 DIAGNOSIS — F419 Anxiety disorder, unspecified: Secondary | ICD-10-CM

## 2014-01-21 DIAGNOSIS — K709 Alcoholic liver disease, unspecified: Secondary | ICD-10-CM

## 2014-01-21 DIAGNOSIS — F341 Dysthymic disorder: Secondary | ICD-10-CM

## 2014-01-21 DIAGNOSIS — Z8669 Personal history of other diseases of the nervous system and sense organs: Secondary | ICD-10-CM

## 2014-01-21 DIAGNOSIS — F418 Other specified anxiety disorders: Secondary | ICD-10-CM

## 2014-01-21 DIAGNOSIS — F411 Generalized anxiety disorder: Secondary | ICD-10-CM

## 2014-01-21 DIAGNOSIS — F32A Depression, unspecified: Secondary | ICD-10-CM

## 2014-01-21 DIAGNOSIS — F3289 Other specified depressive episodes: Secondary | ICD-10-CM

## 2014-01-21 DIAGNOSIS — F1011 Alcohol abuse, in remission: Secondary | ICD-10-CM

## 2014-01-21 NOTE — Patient Instructions (Signed)
Your physician recommends that you schedule a follow-up appointment in: 1 year. sooner if needed. No changes were made today in your therapy.

## 2014-01-23 ENCOUNTER — Ambulatory Visit: Payer: BC Managed Care – PPO | Admitting: Neurology

## 2014-01-27 ENCOUNTER — Encounter: Payer: Self-pay | Admitting: Cardiovascular Disease

## 2014-01-27 NOTE — Progress Notes (Signed)
Patient ID: Gloria [name], female   DOB: 12-Feb-1971, 43 y.o.   MRN: [id number]       HPI: [name] is a 43 year old female who I initially saw through courtesy of Dr. Uvaldo Rising for evaluation of cardiac murmur. She  presents for followup cardiology evaluation.   Gloria [name] has a long-standing history of depression and also had undergone detoxification for alcohol in the past. At that time, she had significant LFT elevation and was developing cirrhosis.  She  has been dry for several years and ultimately her liver function studies have normalized. Recently, she had undergone a transvaginal hysterectomy with partial right salpingectomy by Dr. Toney Rakes. He had seen her recently for hospital followup evaluation and at that time was concerned of a murmur which he graded as 3/6 and she presented for evaluation. According to the patient, she had never been previously told of having a cardiac murmur.   Additional problems also include migraine headaches, neuropathy for which she has taken Neurontin. She has had difficulty with sleep and has been taking Lunesta 3 mg daily. Upon further questioning she typically oftentimes wakes up at night gasping for breath. She does snore loudly and at times wakes herself up a cause of her snore. Her sleep is nonrestorative. She does note daytime fatigue the remote, she had been on Lexapro for many years for depression and recently has been treated with vilazodone at 40 mg. Gloria [name] denies any episodes of chest pain. She is unaware of tachycardia or palpitations she denies presyncope or syncope.  A 2-D echo Doppler study on 11/01/2013 showed normal left ventricular size and function. She had normal diastolic function. She did have increased atrial septal thickness consistent with lipomatous hypertrophy. There was tricuspid regurgitation. There was no evidence for significant valvular pathology.  Because of difficulty with sleep, she underwent a diagnostic  polysomnogram. She was not found to have obstructive sleep apnea. AHI was 0. Her, her RDI was 5.9. Of note, she did have frequent periodic leg movements at 257 with an index of 16 and several lateral arousals per was felt most likely that she may very well have restless leg syndrome or which may be disrupting her sleep pattern rather than obstructive sleep apnea.  I last saw her, I recommended a trial of requip for her restless legs. Apparently, it he felt this exacerbated pinpoint migraine headaches. She had contacted our office and apparently was then told to try Mirapax  but she also did not tolerate. She apparently subsequently was seen by her primary physician. She continues to have significant fatigue is very tired. He further review of her polysomnogram indicates that she had complete absence of REM sleep. She has been on antidepressant therapy for several years it is highly likely that this may be contributing to her absence of REM sleep which is contributing to her continued marked fatigability. She states that her depression has been controlled. She has seen Dr. Sabra Heck in the past and apparently will be seeing Dr. Aline August.  Past Medical History  Diagnosis Date  . Smoker   . History of ETOH abuse   . Elevated LFTs   . Fibromyalgia   . Bulging disc     X 2  . Arthritis   . Hyperlipidemia   . Osteoarthritis     lower back    Past Surgical History  Procedure Laterality Date  . Intrauterine device insertion  08/09/2008    PARAGUARD  . Neck surgery    . Kidney  surgery  1995  . Radial tunnel Bilateral 2005  . Vaginal hysterectomy Left 06/06/2013    Procedure: Vaginal Hysterectomy with Patiial Right Salpingectomy;  Surgeon: Terrance Mass, MD;  Location: [facility];  Service: Gynecology;  Laterality: Left;  . Laparoscopy N/A 06/06/2013    Procedure: LAPAROSCOPY DIAGNOSTIC;  Surgeon: Terrance Mass, MD;  Location: [facility];  Service: Gynecology;  Laterality: N/A;  . Laparotomy  06/06/2013     Procedure: EXPLORATORY LAPAROTOMY;  Surgeon: Terrance Mass, MD;  Location: [facility];  Service: Gynecology;;  . Cystoscopy  06/06/2013    Procedure: CYSTOSCOPY;  Surgeon: Terrance Mass, MD;  Location: [facility];  Service: Gynecology;;    Allergies  Allergen Reactions  . Benadryl [Diphenhydramine Hcl] Hives    Current Outpatient Prescriptions  Medication Sig Dispense Refill  . B Complex Vitamins (B COMPLEX PO) Take 1 tablet by mouth daily.      . Calcium Carbonate-Vitamin D (CALCIUM 600/VITAMIN D) 600-400 MG-UNIT per tablet Take 1 tablet by mouth daily.      . cetirizine (ZYRTEC) 10 MG tablet Take 10 mg by mouth daily.        . Coenzyme Q10 (COQ10) 100 MG CAPS Take 100 mg by mouth daily.      . cyclobenzaprine (FLEXERIL) 10 MG tablet Take 10 mg by mouth as needed.       [name] esomeprazole (NEXIUM) 40 MG capsule Take 40 mg by mouth daily before breakfast.      . Eszopiclone (ESZOPICLONE) 3 MG TABS Take 3 mg by mouth at bedtime. Take immediately before bedtime      . gabapentin (NEURONTIN) 800 MG tablet Take 800 mg by mouth 3 (three) times daily.       . hydrOXYzine (ATARAX/VISTARIL) 25 MG tablet Take 25 mg by mouth daily as needed for anxiety.       [name] ibuprofen (ADVIL,MOTRIN) 800 MG tablet Take 1 tablet (800 mg total) by mouth every 8 (eight) hours as needed for pain.  30 tablet  2  . LORazepam (ATIVAN) 1 MG tablet Take 1 mg by mouth daily as needed for anxiety.       . magnesium gluconate (MAGONATE) 500 MG tablet Take 500 mg by mouth 3 (three) times daily.      . montelukast (SINGULAIR) 10 MG tablet Take 10 mg by mouth at bedtime.        . Multiple Vitamin (MULTIVITAMIN WITH MINERALS) TABS Take 1 tablet by mouth daily.      . Omega-3 Fatty Acids (OMEGA-3 FISH OIL) 300 MG CAPS Take 1 capsule by mouth daily.      [name] PROAIR HFA 108 (90 BASE) MCG/ACT inhaler Inhale 1 puff into the lungs as needed.      . rizatriptan (MAXALT) 10 MG tablet Take 10 mg by mouth as needed for migraine. May repeat in 2  hours if needed      . thiamine (VITAMIN B-1) 50 MG tablet Take 50 mg by mouth daily.      . Vilazodone HCl (VIIBRYD) 40 MG TABS Take 40 mg by mouth every evening.       . vitamin C (ASCORBIC ACID) 500 MG tablet Take 500 mg by mouth daily.       No current facility-administered medications for this visit.    Social history is notable in that she is separated. She has one child. 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 She does not routinely exercise.  Family  History  Problem Relation Age of Onset  . Breast cancer Mother   . Breast cancer Maternal Aunt   . Breast cancer Paternal Grandmother     COLON  . Cancer Paternal Grandfather     THROAT .[name]    ROS is negative for fever chills or night sweats. She denies skin rash. She denies hearing difficulties. In the past she was told of possibly having fibromyalgia. She does have migraine headaches. She denies shortness of breath. Previously she was never told of having a heart murmur. She denies palpitations, presyncope or syncope. She denies abdominal pain. She is status post recent hysterectomy with her ovaries intact. She has 3 plates in her neck following neck surgery. She is also status post remote kidney surgery. She denies claudication. She denies diabetes. There is no cold or heat intolerance. She denies seizures. She does have history of depression. From a sleep perspective, she snores. She wakes up several times per night. She has been awakening gasping for breath. Her sleep is nonrestorative. At times she does note some daytime fatigue. She denies hypnagogic hallucinations. There is no cataplexy. She is unaware of bruxism. Other comprehensive 14 point system review is negative.   PE BP 116/80  Pulse 92  Ht 5' 6"  (1.676 m)  Wt 244 lb 14.4 oz (111.086 kg)  BMI 39.55 kg/m2  LMP 04/25/2013 General: Alert, oriented, no distress. Moderate obesity Skin: normal turgor, no rashes HEENT: Normocephalic, atraumatic.  Pupils round and reactive; sclera anicteric; Fundi without hemorrhages or exudates Nose without nasal septal hypertrophy Mouth/Parynx benign; Mallinpatti scale 3 Neck: No JVD, no carotid bruits; thick neck Lungs: clear to ausculatation and percussion; no wheezing or rales Chest wall: No tenderness to palpate Heart: RRR, s1 s2 normal 6-1/6 systolic murmur along the left lower sternal border and slightly more prominent at the apex suggesting mitral regurgitation Abdomen: soft, nontender; no hepatosplenomehaly, BS+; abdominal aorta nontender and not dilated by palpation. Back: No CVA tenderness  Pulses 2+ Extremities: no clubbinbg cyanosis or edema, Homan's sign negative  Neurologic: grossly nonfocal Psychologic: Normal mood and affect    ECG independently read by me reveals normal sinus rhythm at 75 beats per minute. QTc  453 ms. No significant ST changes.  Prior ECG of 10/16/2013: Sinus rhythm 86 beats per minute. Nonspecific ST changes.  LABS:  BMET    Component Value Date/Time   NA 137 10/17/2013 0942   K 4.5 10/17/2013 0942   CL 104 10/17/2013 0942   CO2 25 10/17/2013 0942   GLUCOSE 105* 10/17/2013 0942   BUN 11 10/17/2013 0942   CREATININE 0.61 10/17/2013 0942   CREATININE 0.59 06/07/2013 0530   CALCIUM 9.0 10/17/2013 0942   GFRNONAA >90 06/07/2013 0530   GFRAA >90 06/07/2013 0530     Hepatic Function Panel     Component Value Date/Time   PROT 7.0 10/17/2013 0942   ALBUMIN 4.1 10/17/2013 0942   AST 14 10/17/2013 0942   ALT 11 10/17/2013 0942   ALKPHOS 65 10/17/2013 0942   BILITOT 0.3 10/17/2013 0942     CBC    Component Value Date/Time   WBC 12.8* 10/12/2013 1558   RBC 4.57 10/12/2013 1558   HGB 11.4* 10/12/2013 1558   HCT 35.2* 10/12/2013 1558   PLT 343 10/12/2013 1558   MCV 77.0* 10/12/2013 1558   MCH 24.9* 10/12/2013 1558   MCHC 32.4 10/12/2013 1558   RDW 16.1* 10/12/2013 1558   LYMPHSABS 3.4 10/12/2013 1558   MONOABS 1.1* 10/12/2013 1558  EOSABS  0.2 10/12/2013 1558   BASOSABS 0.0 10/12/2013 1558     BNP No results found for this basename: probnp    Lipid Panel     Component Value Date/Time   CHOL 206* 10/17/2013 0942   TRIG 201* 10/17/2013 0942   HDL 43 10/17/2013 0942   CHOLHDL 4.8 10/17/2013 0942   VLDL 40 10/17/2013 0942   LDLCALC 123* 10/17/2013 0942     RADIOLOGY: No results found.   ASSESSMENT AND PLAN: My impression is that [name] is a 43 year old female who is status post recent hysterectomy who has a significant history of depression for which she's been on antidepressant medication, as well as a history of migraine headache. She does have a at least a 1-2/6 cardiac murmur suggestive of possible mitral regurgitation be most prominent at the apex. Her most recent echo Doppler  revealed normal systolic and diastolic function without evidence for hypertrophic cardiomyopathy. She did not have significant valvular pathology. She had normal LV size and function. On recent laboratory glucose was 105. She had normal renal function with a BUN of 11 creatinine 0.61. Liver function studies were normal in this patient who previously had markedly abnormal liver function abnormalities due to her significant previous EtOH intake. Thyroid function studies are normal with a TSH 2.05.  Vitamin D level was also normal at 38. Total cholesterol was mildly increased at 206 as were her triglycerides are 201. HDL was 43. LDL was 123. We discussed reduction in glycemic indices of food and diet. She has continued difficulty with being extremely tired and fatigued. I again reviewed her sleep study. She may very well have some slight increased upper airway resistance the contributing to her snoring and slightly elevated RDI, but she did not have significant obstructive sleep apnea. She did have significant frequent periodic leg movement disorder of sleep which may be contributing to some of her disruptive sleep pattern. She did not  tolerate a trial of either requip or Mirapex for restless legs. I suspect  a big contributor to her continued fatigability is complete absence of REM sleep which has been associated with antidepressant therapy. Typically, antidepressants such as tricyclic antidepressants, MAO inhibitors, and SSRI agents are associated with significant decrease in REM sleep as well as increase in periodic limb movements. Agents which do not suppress REM sleep include trazodone but this may have increased sedative effects. Mirtazapine typically can either have a neutral effect or can increase REM sleep and bupropion can increase REM sleep and is less likely to increase periodic leg movement disorder. I have recommended that she followup with her psychiatry physicians and to see if it may be possible for her to have adjustment in her antidepressant medication. I also have recommended initiation of fish oil therapy for her mild hypertriglyceridemia.   Troy Sine, MD, [facility] 01/27/2014 2:44 PM

## 2014-02-08 ENCOUNTER — Other Ambulatory Visit: Payer: Self-pay

## 2014-02-08 DIAGNOSIS — Z1231 Encounter for screening mammogram for malignant neoplasm of breast: Secondary | ICD-10-CM

## 2014-02-25 ENCOUNTER — Ambulatory Visit (INDEPENDENT_AMBULATORY_CARE_PROVIDER_SITE_OTHER): Payer: BC Managed Care – PPO | Admitting: Neurology

## 2014-02-25 ENCOUNTER — Encounter: Payer: Self-pay | Admitting: Neurology

## 2014-02-25 VITALS — BP 127/78 | HR 91 | Ht 66.0 in | Wt 248.5 lb

## 2014-02-25 DIAGNOSIS — D509 Iron deficiency anemia, unspecified: Secondary | ICD-10-CM

## 2014-02-25 DIAGNOSIS — G2581 Restless legs syndrome: Secondary | ICD-10-CM

## 2014-02-25 DIAGNOSIS — G4761 Periodic limb movement disorder: Secondary | ICD-10-CM

## 2014-02-25 HISTORY — DX: Periodic limb movement disorder: G47.61

## 2014-02-25 MED ORDER — CARBIDOPA-LEVODOPA ER 25-100 MG PO TBCR: 1.0000 | EXTENDED_RELEASE_TABLET | Freq: Every day | ORAL | Status: DC

## 2014-02-25 NOTE — Patient Instructions (Signed)
Restless Legs Syndrome Restless legs syndrome is a movement disorder. It may also be called a sensori-motor disorder.  CAUSES  No one knows what specifically causes restless legs syndrome, but it tends to run in families. It is also more common in people with low iron, in pregnancy, in people who need dialysis, and those with nerve damage (neuropathy).Some medications may make restless legs syndrome worse.Those medications include drugs to treat high blood pressure, some heart conditions, nausea, colds, allergies, and depression. SYMPTOMS Symptoms include uncomfortable sensations in the legs. These leg sensations are worse during periods of inactivity or rest. They are also worse while sitting or lying down. Individuals that have the disorder describe sensations in the legs that feel like:  Pulling.  Drawing.  Crawling.  Worming.  Boring.  Tingling.  Pins and needles.  Prickling.  Pain. The sensations are usually accompanied by an overwhelming urge to move the legs. Sudden muscle jerks may also occur. Movement provides temporary relief from the discomfort. In rare cases, the arms may also be affected. Symptoms may interfere with going to sleep (sleep onset insomnia). Restless legs syndrome may also be related to periodic limb movement disorder (PLMD). PLMD is another more common motor disorder. It also causes interrupted sleep. The symptoms from PLMD usually occur most often when you are awake. TREATMENT  Treatment for restless legs syndrome is symptomatic. This means that the symptoms are treated.   Massage and cold compresses may provide temporary relief.  Walk, stretch, or take a cold or hot bath.  Get regular exercise and a good night's sleep.  Avoid caffeine, alcohol, nicotine, and medications that can make it worse.  Do activities that provide mental stimulation like discussions, needlework, and video games. These may be helpful if you are not able to walk or  stretch. Some medications are effective in relieving the symptoms. However, many of these medications have side effects. Ask your caregiver about medications that may help your symptoms. Correcting iron deficiency may improve symptoms for some patients. Document Released: 10/29/2002 Document Revised: 01/31/2012 Document Reviewed: 02/04/2011 [facility] Patient Information 2014 [place].

## 2014-02-25 NOTE — Progress Notes (Signed)
Reason for visit: Periodic limb movements  Gloria [name] is a 43 y.o. female  History of present illness:  Ms. Pugsley is a 43 year old right-handed white female with a history of excessive daytime drowsiness and fatigue. The patient has had some issues with this for 5 years, but the problem has not worsened since July of 2014. The patient underwent a sleep study that showed significant problems with periodic limb movements throughout the night. The patient has been sent to this office for an evaluation of this issue. The patient will note an occasional jerk or twitch of the extremities during the daytime, and at nighttime, when she gets to sleep, she notes that she will have some restless sensations in the lower extremities. The patient is able to eventually get to sleep, but she does not have any knowledge that she is jerking or twitching throughout the night. The patient will wake up in a fatigued state, and she has been tried on several different medications for this issue. The patient has been on Mirapex and Requip, which indicated these medications increased her headaches, and she cannot tolerate the medication. The patient is already on gabapentin and Ativan. The patient is sent to this office for further evaluation of her sleep disorder.  Past Medical History  Diagnosis Date  . Smoker   . History of ETOH abuse   . Elevated LFTs   . Fibromyalgia   . Bulging disc     X 2  . Arthritis   . Hyperlipidemia   . Osteoarthritis     lower back  . Obese   . Periodic limb movement disorder 02/25/2014    Past Surgical History  Procedure Laterality Date  . Intrauterine device insertion  08/09/2008    PARAGUARD  . Neck surgery    . Kidney surgery  1995    abcess  . Radial tunnel Bilateral 2005  . Vaginal hysterectomy Left 06/06/2013    Procedure: Vaginal Hysterectomy with Patiial Right Salpingectomy;  Surgeon: Terrance Mass, MD;  Location: [facility];  Service: Gynecology;  Laterality:  Left;  . Laparoscopy N/A 06/06/2013    Procedure: LAPAROSCOPY DIAGNOSTIC;  Surgeon: Terrance Mass, MD;  Location: [facility];  Service: Gynecology;  Laterality: N/A;  . Laparotomy  06/06/2013    Procedure: EXPLORATORY LAPAROTOMY;  Surgeon: Terrance Mass, MD;  Location: [facility];  Service: Gynecology;;  . Cystoscopy  06/06/2013    Procedure: CYSTOSCOPY;  Surgeon: Terrance Mass, MD;  Location: [facility];  Service: Gynecology;;    Family History  Problem Relation Age of Onset  . Breast cancer Mother   . Breast cancer Maternal Aunt   . Breast cancer Paternal Grandmother     COLON  . Cancer Paternal Grandfather     THROAT .[name]    Social history:  reports that she quit smoking about 2 years ago. She has never used smokeless tobacco. She reports that she does not drink alcohol or use illicit drugs.  Medications:  Current Outpatient Prescriptions on File Prior to Visit  Medication Sig Dispense Refill  . B Complex Vitamins (B COMPLEX PO) Take 1 tablet by mouth daily.      . Calcium Carbonate-Vitamin D (CALCIUM 600/VITAMIN D) 600-400 MG-UNIT per tablet Take 1 tablet by mouth 2 (two) times daily.       . cetirizine (ZYRTEC) 10 MG tablet Take 10 mg by mouth daily.        . Coenzyme Q10 (COQ10) 100 MG CAPS Take 100 mg  by mouth daily.      [name] esomeprazole (NEXIUM) 40 MG capsule Take 40 mg by mouth daily before breakfast.      . gabapentin (NEURONTIN) 800 MG tablet Take 800 mg by mouth 3 (three) times daily.       . hydrOXYzine (ATARAX/VISTARIL) 25 MG tablet Take 25 mg by mouth daily as needed for anxiety.       [name] LORazepam (ATIVAN) 1 MG tablet Take 1 mg by mouth at bedtime.       . magnesium gluconate (MAGONATE) 500 MG tablet Take 500 mg by mouth 3 (three) times daily.      . Omega-3 Fatty Acids (OMEGA-3 FISH OIL) 300 MG CAPS Take 1 capsule by mouth daily.      [name] PROAIR HFA 108 (90 BASE) MCG/ACT inhaler Inhale 1 puff into the lungs as needed.      . rizatriptan (MAXALT) 10 MG tablet Take 10 mg by  mouth as needed for migraine. May repeat in 2 hours if needed      . thiamine (VITAMIN B-1) 50 MG tablet Take 50 mg by mouth daily.       No current facility-administered medications on file prior to visit.      Allergies  Allergen Reactions  . Benadryl [Diphenhydramine Hcl] Hives  . Requip [Ropinirole Hcl]     Increased headache    ROS:  Out of a complete 14 system review of symptoms, the patient complains only of the following symptoms, and all other reviewed systems are negative.  Weight gain, fatigue Swelling in the legs Itching Blurred vision Shortness of breath, snoring Anemia Feeling hot, increased thirst Joint pain, joint swelling, muscle cramps, achy muscles Allergies Confusion, headache, weakness, slurred speech, seizures, passing out Depression, anxiety, too much sleep, decreased energy, change in appetite, disinterest in activities Restless legs  Blood pressure 127/78, pulse 91, height 5' 6"  (1.676 m), weight 248 lb 8 oz (112.719 kg), last menstrual period 04/25/2013.  The Epworth Sleepiness Scale score is 17.  Physical Exam  General: The patient is alert and cooperative at the time of the examination. The patient is moderately obese.  Eyes: Pupils are equal, round, and reactive to light. Discs are flat bilaterally.  Neck: The neck is supple, no carotid bruits are noted.  Respiratory: The respiratory examination is clear.  Cardiovascular: The cardiovascular examination reveals a regular rate and rhythm, no obvious murmurs or rubs are noted.  Skin: Extremities are with 1-2+ edema in the legs below the knees, right greater than left.  Neurologic Exam  Mental status: The patient is alert and oriented x 3 at the time of the examination. The patient has apparent normal recent and remote memory, with an apparently normal attention span and concentration ability.  Cranial nerves: Facial symmetry is present. There is good sensation of the face to pinprick and  soft touch bilaterally. The strength of the facial muscles and the muscles to head turning and shoulder shrug are normal bilaterally. Speech is well enunciated, no aphasia or dysarthria is noted. Extraocular movements are full. Visual fields are full. The tongue is midline, and the patient has symmetric elevation of the soft palate. No obvious hearing deficits are noted.  Motor: The motor testing reveals 5 over 5 strength of all 4 extremities. Good symmetric motor tone is noted throughout.  Sensory: Sensory testing is intact to pinprick, soft touch, vibration sensation, and position sense on all 4 extremities. No evidence of extinction is noted.  Coordination: Cerebellar testing reveals  good finger-nose-finger and heel-to-shin bilaterally.  Gait and station: Gait is normal. Tandem gait is normal. Romberg is negative. No drift is seen.  Reflexes: Deep tendon reflexes are symmetric and normal bilaterally, with exception of ankle jerk reflexes are depressed bilaterally. Toes are downgoing bilaterally.   Assessment/Plan:  1. Restless leg syndrome  2. Periodic limb movement disorder  3. Depression  4. Iron deficiency anemia  The patient appears to have had an iron deficiency anemia in the past. The patient had a hysterectomy in the summer of 2014, and she is on a iron supplementation. The iron level and ferritin level will be checked today. The patient will be placed on Sinemet, but if her headaches increase on this medication, she will need to stop this. Opiate medications in the future may be used in the evening hours. The patient is already on lorazepam and gabapentin which can be useful for restless leg syndrome and for periodic limb movement disorder. The patient was recently taken off of Viibryd, and this should be helpful as well. The patient is not able tolerate Wellbutrin in the past. It is possible that one of the tricyclic antidepressants may be used in the future. SSRI antidepressants  or medications with features similar to SSRI antidepressants may promote periodic limb movements.  Jill Alexanders MD 02/25/2014 9:04 PM  [facility] [address] [place], [place] [address]  Phone [phone] Fax [phone]

## 2014-02-26 ENCOUNTER — Telehealth: Payer: Self-pay | Admitting: Neurology

## 2014-02-26 ENCOUNTER — Other Ambulatory Visit: Payer: Self-pay | Admitting: Neurology

## 2014-02-26 DIAGNOSIS — D509 Iron deficiency anemia, unspecified: Secondary | ICD-10-CM

## 2014-02-26 LAB — CBC WITH DIFFERENTIAL
BASOS: 1 %
Basophils Absolute: 0.1 10*3/uL (ref 0.0–0.2)
Eos: 2 %
Eosinophils Absolute: 0.2 10*3/uL (ref 0.0–0.4)
HEMATOCRIT: 36.9 % (ref 34.0–46.6)
HEMOGLOBIN: 12.1 g/dL (ref 11.1–15.9)
IMMATURE GRANS (ABS): 0.1 10*3/uL (ref 0.0–0.1)
Immature Granulocytes: 1 %
LYMPHS: 21 %
Lymphocytes Absolute: 2.6 10*3/uL (ref 0.7–3.1)
MCH: 26.9 pg (ref 26.6–33.0)
MCHC: 32.8 g/dL (ref 31.5–35.7)
MCV: 82 fL (ref 79–97)
MONOCYTES: 7 %
Monocytes Absolute: 0.8 10*3/uL (ref 0.1–0.9)
NEUTROS ABS: 8.2 10*3/uL — AB (ref 1.4–7.0)
NEUTROS PCT: 68 %
Platelets: 281 10*3/uL (ref 150–379)
RBC: 4.49 x10E6/uL (ref 3.77–5.28)
RDW: 16.2 % — ABNORMAL HIGH (ref 12.3–15.4)
WBC: 12 10*3/uL — AB (ref 3.4–10.8)

## 2014-02-26 LAB — IRON AND TIBC
Iron Saturation: 11 % — ABNORMAL LOW (ref 15–55)
Iron: 41 ug/dL (ref 35–155)
TIBC: 376 ug/dL (ref 250–450)
UIBC: 335 ug/dL (ref 150–375)

## 2014-02-26 LAB — FERRITIN: FERRITIN: 37 ng/mL (ref 15–150)

## 2014-02-26 NOTE — Telephone Encounter (Signed)
I called the patient. The serum ferritin and iron levels are in the low normal range. I will try to give her IV iron, as she is already on iron supplementation.

## 2014-02-27 ENCOUNTER — Telehealth: Payer: Self-pay | Admitting: *Deleted

## 2014-02-27 ENCOUNTER — Other Ambulatory Visit: Payer: Self-pay | Admitting: Nurse Practitioner

## 2014-02-27 NOTE — Telephone Encounter (Signed)
I called pt.  She has not had an iron infusion before.   I told her that it is done at [facility].  (6 hour infusion, first hour pt is given test dose, then proceeds if no problem).  [employer] called Estill Bamberg M with Care Management Operations.  Stated no PA required.  For G9030 and CPT 96365.  Sent to Dr. Jannifer Franklin for orders to be placed into [facility], then will call to schedule [phone].  (per pt this Friday good, next week T-Fr, then week after that anyday).  May call pt on mobile, LM if needed.

## 2014-02-28 NOTE — Telephone Encounter (Signed)
I called pt and let her know that [facility] appt is set for 03-14-14 at 0700.  I gave her their number ([phone]).  She had questions about cause of iron deficiency anemia.  She also has had no headaches with the sinemet.

## 2014-03-01 ENCOUNTER — Encounter: Payer: Self-pay | Admitting: Neurology

## 2014-03-01 ENCOUNTER — Telehealth: Payer: Self-pay | Admitting: Neurology

## 2014-03-01 ENCOUNTER — Telehealth: Payer: Self-pay | Admitting: *Deleted

## 2014-03-01 NOTE — Telephone Encounter (Signed)
Pt called. She stated that on 02-25-14 Dr. Jannifer Franklin prescribed her Sinemet CR. The first 2 nights of taking it were great.  Her psychologist office prescribed her nortriptyline and her 3rd night's sleep was awful, tossing and turning and very restless.  She stated that the Psychologist office was speaking with Dr. Jannifer Franklin to let him know what they were putting her on.  She wants to make sure that this medication was what they discussed as well as if there are any other options because it doesn't seem like it is going to work for her. Please call to discuss.  Thank you

## 2014-03-01 NOTE — Telephone Encounter (Signed)
Called patient to let her know work note ready for pick-up. Patient requested that note be faxed to her job [phone].

## 2014-03-01 NOTE — Telephone Encounter (Signed)
I called the patient. The patient indicated that the nortriptyline at 25 mg at night worsened her sleep patterns. The Sinemet seemed to help her for 2 nights aerobic with the restless legs, and the patient was able to get good sleep. The patient will stop the nortriptyline at this time, try the Sinemet for 2 weeks, and if she is doing well with this, we'll add a low dose of nortriptyline at 10 mg. If this is not tolerated, which may try Seroquel instead. The patient wants a note to keep her out of work today.

## 2014-03-01 NOTE — Telephone Encounter (Signed)
Pt called. She stated that on 02-25-14 Dr. Jannifer Franklin prescribed her Sinemet CR. The first 2 nights of taking it were great. Her psychologist office prescribed her nortriptyline and her 3rd night's sleep was awful, tossing and turning and very restless. She stated that the Psychologist office was speaking with Dr. Jannifer Franklin to let him know what they were putting her on. She wants to make sure that this medication was what they discussed as well as if there are any other options because it doesn't seem like it is going to work for her. Please call to discuss

## 2014-03-04 ENCOUNTER — Ambulatory Visit
Admission: RE | Admit: 2014-03-04 | Discharge: 2014-03-04 | Disposition: A | Discharge status: Home / Self Care | Payer: BC Managed Care – PPO | Source: Ambulatory Visit

## 2014-03-04 DIAGNOSIS — Z1231 Encounter for screening mammogram for malignant neoplasm of breast: Secondary | ICD-10-CM

## 2014-03-11 ENCOUNTER — Other Ambulatory Visit: Payer: Self-pay | Admitting: Neurology

## 2014-03-11 ENCOUNTER — Encounter: Payer: Self-pay | Admitting: Neurology

## 2014-03-11 DIAGNOSIS — G2581 Restless legs syndrome: Secondary | ICD-10-CM

## 2014-03-14 ENCOUNTER — Telehealth: Payer: Self-pay | Admitting: Neurology

## 2014-03-14 ENCOUNTER — Other Ambulatory Visit (HOSPITAL_COMMUNITY): Payer: Self-pay | Admitting: Neurology

## 2014-03-14 ENCOUNTER — Encounter (HOSPITAL_COMMUNITY)
Admission: RE | Admit: 2014-03-14 | Discharge: 2014-03-14 | Disposition: A | Discharge status: Home / Self Care | Payer: BC Managed Care – PPO | Source: Ambulatory Visit | Attending: Neurology | Admitting: Neurology

## 2014-03-14 ENCOUNTER — Encounter (HOSPITAL_COMMUNITY): Payer: Self-pay

## 2014-03-14 VITALS — BP 128/68 | HR 89 | Temp 98.0°F | Resp 18 | Ht 66.0 in | Wt 245.0 lb

## 2014-03-14 DIAGNOSIS — D509 Iron deficiency anemia, unspecified: Secondary | ICD-10-CM | POA: Insufficient documentation

## 2014-03-14 MED ORDER — SODIUM CHLORIDE 0.9 % IV SOLN: 100.0000 mg | Freq: Once | INTRAVENOUS | Status: DC

## 2014-03-14 MED ORDER — SODIUM CHLORIDE 0.9 % IV SOLN
INTRAVENOUS | Status: DC
  Administered 2014-03-14: 250 mL via INTRAVENOUS

## 2014-03-14 MED ORDER — SODIUM CHLORIDE 0.9 % IV SOLN
1000.0000 mg | Freq: Once | INTRAVENOUS | Status: AC
  Administered 2014-03-14: 1000 mg via INTRAVENOUS
  Filled 2014-03-14: qty 20

## 2014-03-14 MED ORDER — SODIUM CHLORIDE 0.9 % IV SOLN
25.0000 mg | Freq: Once | INTRAVENOUS | Status: AC
  Administered 2014-03-14: 25 mg via INTRAVENOUS
  Filled 2014-03-14: qty 0.5

## 2014-03-14 NOTE — Discharge Instructions (Signed)
ATTENTION:  If you are going to be 15 or more minutes late for your appointment, please call [phone] to make other arrangements for your treatment.  If you arrive early for your schedule appointment, you may have to wait until your scheduled time.Iron Dextran injection What is this medicine? IRON DEXTRAN (AHY ern DEX tran) is an iron complex. Iron is used to make healthy red blood cells, which carry oxygen and nutrients through the body. This medicine is used to treat people who cannot take iron by mouth and have low levels of iron in the blood. This medicine may be used for other purposes; ask your health care provider or pharmacist if you have questions. COMMON BRAND NAME(S): Dexferrum, INFeD What should I tell my health care provider before I take this medicine? They need to know if you have any of these conditions: -anemia not caused by low iron levels -heart disease -high levels of iron in the blood -kidney disease -liver disease -an unusual or allergic reaction to iron, other medicines, foods, dyes, or preservatives -pregnant or trying to get pregnant -breast-feeding How should I use this medicine? This medicine is for injection into a vein or a muscle. It is given by a health care professional in a hospital or clinic setting. Talk to your pediatrician regarding the use of this medicine in children. While this drug may be prescribed for children as young as 69 months old for selected conditions, precautions do apply. Overdosage: If you think you have taken too much of this medicine contact a poison control center or emergency room at once. NOTE: This medicine is only for you. Do not share this medicine with others. What if I miss a dose? It is important not to miss your dose. Call your doctor or health care professional if you are unable to keep an appointment. What may interact with this medicine? Do not take this medicine with any of the following  medications: -deferoxamine -dimercaprol -other iron products This medicine may also interact with the following medications: -chloramphenicol -deferasirox This list may not describe all possible interactions. Give your health care provider a list of all the medicines, herbs, non-prescription drugs, or dietary supplements you use. Also tell them if you smoke, drink alcohol, or use illegal drugs. Some items may interact with your medicine. What should I watch for while using this medicine? Visit your doctor or health care professional regularly. Tell your doctor if your symptoms do not start to get better or if they get worse. You may need blood work done while you are taking this medicine. You may need to follow a special diet. Talk to your doctor. Foods that contain iron include: whole grains/cereals, dried fruits, beans, or peas, leafy green vegetables, and organ meats (liver, kidney). Long-term use of this medicine may increase your risk of some cancers. Talk to your doctor about how to limit your risk. What side effects may I notice from receiving this medicine? Side effects that you should report to your doctor or health care professional as soon as possible: -allergic reactions like skin rash, itching or hives, swelling of the face, lips, or tongue -blue lips, nails, or skin -breathing problems -changes in blood pressure -chest pain -confusion -fast, irregular heartbeat -feeling faint or lightheaded, falls -fever or chills -flushing, sweating, or hot feelings -joint or muscle aches or pains -pain, tingling, numbness in the hands or feet -seizures -unusually weak or tired Side effects that usually do not require medical attention (report to your doctor or  health care professional if they continue or are bothersome): -change in taste (metallic taste) -diarrhea -headache -irritation at site where injected -nausea, vomiting -stomach upset This list may not describe all possible  side effects. Call your doctor for medical advice about side effects. You may report side effects to FDA at 1-800-FDA-1088. Where should I keep my medicine? This drug is given in a hospital or clinic and will not be stored at home. NOTE: This sheet is a summary. It may not cover all possible information. If you have questions about this medicine, talk to your doctor, pharmacist, or health care provider.  2014, Elsevier/Gold Standard. (2008-03-26 16:59:50)

## 2014-03-14 NOTE — Telephone Encounter (Signed)
[facility]. They indicated that the usual IV dextran dose is from 1-1.5 g. We will give the patient 1 g IV, with a 25 mg test dose as she has not gotten this medication previously.

## 2014-04-10 ENCOUNTER — Encounter: Payer: Self-pay | Admitting: Neurology

## 2014-04-11 ENCOUNTER — Other Ambulatory Visit: Payer: Self-pay | Admitting: Neurology

## 2014-04-11 MED ORDER — QUETIAPINE FUMARATE 25 MG PO TABS: 25.0000 mg | ORAL_TABLET | Freq: Every day | ORAL | Status: DC

## 2014-04-15 ENCOUNTER — Encounter: Payer: Self-pay | Admitting: Neurology

## 2014-05-31 ENCOUNTER — Ambulatory Visit (INDEPENDENT_AMBULATORY_CARE_PROVIDER_SITE_OTHER): Payer: BC Managed Care – PPO

## 2014-05-31 ENCOUNTER — Ambulatory Visit (INDEPENDENT_AMBULATORY_CARE_PROVIDER_SITE_OTHER): Payer: BC Managed Care – PPO | Admitting: Emergency Medicine

## 2014-05-31 VITALS — BP 128/86 | HR 92 | Resp 18

## 2014-05-31 DIAGNOSIS — M25579 Pain in unspecified ankle and joints of unspecified foot: Secondary | ICD-10-CM

## 2014-05-31 DIAGNOSIS — S92212A Displaced fracture of cuboid bone of left foot, initial encounter for closed fracture: Secondary | ICD-10-CM

## 2014-05-31 DIAGNOSIS — M25572 Pain in left ankle and joints of left foot: Secondary | ICD-10-CM

## 2014-05-31 DIAGNOSIS — S92213A Displaced fracture of cuboid bone of unspecified foot, initial encounter for closed fracture: Secondary | ICD-10-CM

## 2014-05-31 MED ORDER — HYDROCODONE-ACETAMINOPHEN 5-325 MG PO TABS: 1.0000 | ORAL_TABLET | ORAL | Status: DC | PRN

## 2014-05-31 NOTE — Progress Notes (Signed)
[facility] [address], [place] [address] [phone]  Date:  05/31/2014   Name:  Dillon Mcreynolds   DOB:  28-Jul-1971   MRN:  [id number]  PCP:  Stephens Shire, MD    Chief Complaint: Foot Pain   History of Present Illness:  Aneliz Carbary is a 43 y.o. very pleasant female patient who presents with the following:  [place] and slipped and fell.  Has pain in left foot and ankle.  Able to walk and drove here.  Now has pain and swelling in lateral foot and ankle.  Abrasion on right knee.  Current on TDAP.  Denies other injury.  No improvement with over the counter medications or other home remedies. Denies other complaint or health concern today.   Patient Active Problem List   Diagnosis Date Noted  . Periodic limb movement disorder 02/25/2014  . Restless legs syndrome 12/15/2013  . Cardiac murmur 10/16/2013  . Snoring 10/16/2013  . Hx of migraine headaches 10/16/2013  . Fluid retention 10/12/2013  . Weight gain 10/12/2013  . Anxiety 10/12/2013  . Alcoholic liver disease 74/06/1447  . Ex-cigarette smoker 10/28/2012  . Fibromyalgia 10/27/2012  . Depression with anxiety 10/27/2012  . GERD (gastroesophageal reflux disease) 10/27/2012  . H/O ETOH abuse 10/27/2012    Past Medical History  Diagnosis Date  . Smoker   . History of ETOH abuse   . Elevated LFTs   . Fibromyalgia   . Bulging disc     X 2  . Arthritis   . Hyperlipidemia   . Osteoarthritis     lower back  . Obese   . Periodic limb movement disorder 02/25/2014  . Anemia   . Allergy   . Anxiety   . Depression   . GERD (gastroesophageal reflux disease)   . Substance abuse     Past Surgical History  Procedure Laterality Date  . Intrauterine device insertion  08/09/2008    PARAGUARD  . Neck surgery    . Kidney surgery  1995    abcess  . Radial tunnel Bilateral 2005  . Vaginal hysterectomy Left 06/06/2013    Procedure: Vaginal Hysterectomy with  Patiial Right Salpingectomy;  Surgeon: Terrance Mass, MD;  Location: [facility];  Service: Gynecology;  Laterality: Left;  . Laparoscopy N/A 06/06/2013    Procedure: LAPAROSCOPY DIAGNOSTIC;  Surgeon: Terrance Mass, MD;  Location: [facility];  Service: Gynecology;  Laterality: N/A;  . Laparotomy  06/06/2013    Procedure: EXPLORATORY LAPAROTOMY;  Surgeon: Terrance Mass, MD;  Location: [facility];  Service: Gynecology;;  . Cystoscopy  06/06/2013    Procedure: CYSTOSCOPY;  Surgeon: Terrance Mass, MD;  Location: [facility];  Service: Gynecology;;    History  Substance Use Topics  . Smoking status: Former Smoker -- 1.00 packs/day    Quit date: 01/26/2012  . Smokeless tobacco: Never Used  . Alcohol Use: No     Comment: History of alcohol abuse    Family History  Problem Relation Age of Onset  . Breast cancer Mother   . Breast cancer Maternal Aunt   . Breast cancer Paternal Grandmother     COLON  . Cancer Paternal Grandfather     THROAT .[name]    Allergies  Allergen Reactions  . Benadryl [Diphenhydramine Hcl] Hives  . Requip [Ropinirole Hcl]     Increased headache    Medication list has been reviewed and updated.  Current Outpatient  Prescriptions on File Prior to Visit  Medication Sig Dispense Refill  . B Complex Vitamins (B COMPLEX PO) Take 1 tablet by mouth daily.      . Calcium Carbonate-Vitamin D (CALCIUM 600/VITAMIN D) 600-400 MG-UNIT per tablet Take 1 tablet by mouth 2 (two) times daily.       . Carbidopa-Levodopa ER (SINEMET CR) 25-100 MG tablet controlled release TAKE 1 TABLET BY MOUTH AT BEDTIME  30 tablet  1  . cetirizine (ZYRTEC) 10 MG tablet Take 10 mg by mouth daily.        . Coenzyme Q10 (COQ10) 100 MG CAPS Take 100 mg by mouth daily.      [name] esomeprazole (NEXIUM) 40 MG capsule Take 40 mg by mouth daily before breakfast.      . gabapentin (NEURONTIN) 800 MG tablet Take 800 mg by mouth 3 (three) times daily.       . hydrOXYzine (ATARAX/VISTARIL) 25 MG tablet Take 25 mg by mouth  daily as needed for anxiety.       [name] LORazepam (ATIVAN) 1 MG tablet Take 1 mg by mouth at bedtime.       . magnesium gluconate (MAGONATE) 500 MG tablet Take 500 mg by mouth 3 (three) times daily.      . Multiple Vitamins-Minerals (MULTIVITAMIN PO) Take by mouth daily.      . Omega-3 Fatty Acids (OMEGA-3 FISH OIL) 300 MG CAPS Take 1 capsule by mouth daily.      . Polysacch Fe Cmp-Fe Heme Poly (FEOSOL BIFERA) 28 MG TABS Take by mouth daily.      [name] PROAIR HFA 108 (90 BASE) MCG/ACT inhaler Inhale 1 puff into the lungs as needed.      [name] QUEtiapine (SEROQUEL) 25 MG tablet Take 1 tablet (25 mg total) by mouth at bedtime.  30 tablet  1  . rizatriptan (MAXALT) 10 MG tablet Take 10 mg by mouth as needed for migraine. May repeat in 2 hours if needed      . thiamine (VITAMIN B-1) 50 MG tablet Take 50 mg by mouth daily.      . vitamin C (ASCORBIC ACID) 500 MG tablet Take 500 mg by mouth daily.       No current facility-administered medications on file prior to visit.    Review of Systems:  As per HPI, otherwise negative.    Physical Examination: Filed Vitals:   05/31/14 1844  Pulse: 92  Resp: 18   There were no vitals filed for this visit. There is no weight on file to calculate BMI. Ideal Body Weight:     GEN: WDWN, NAD, Non-toxic, Alert & Oriented x 3 HEENT: Atraumatic, Normocephalic.  Ears and Nose: No external deformity. EXTR: No clubbing/cyanosis/edema NEURO: Normal gait.  PSYCH: Normally interactive. Conversant. Not depressed or anxious appearing.  Calm demeanor.  Right knee:  Abrasion  No effusion LEFT ankle:  Tender lateral malleolus with swelling.  No deformity or ecchymosis LEFT foot:  Tender over fifth MT.  Assessment and Plan: Cuboid fracture Boot vicodin Ortho RICE  Signed,  Ellison Carwin, MD   [facility] reading (PRIMARY) by  Dr. Ouida Sills.  Fracture cuboid?[name]

## 2014-05-31 NOTE — Patient Instructions (Signed)
Metatarsal Fracture, Undisplaced A metatarsal fracture is a break in the bone(s) of the foot. These are the bones of the foot that connect your toes to the bones of the ankle. DIAGNOSIS  The diagnoses of these fractures are usually made with X-rays. If there are problems in the forefoot and x-rays are normal a later bone scan will usually make the diagnosis.  TREATMENT AND HOME CARE INSTRUCTIONS  Treatment may or may not include a cast or walking shoe. When casts are needed the use is usually for short periods of time so as not to slow down healing with muscle wasting (atrophy).  Activities should be stopped until further advised by your caregiver.  Wear shoes with adequate shock absorbing capabilities and stiff soles.  Alternative exercise may be undertaken while waiting for healing. These may include bicycling and swimming, or as your caregiver suggests.  It is important to keep all follow-up visits or specialty referrals. The failure to keep these appointments could result in improper bone healing and chronic pain or disability.  Warning: Do not drive a car or operate a motor vehicle until your caregiver specifically tells you it is safe to do so. IF YOU DO NOT HAVE A CAST OR SPLINT:  You may walk on your injured foot as tolerated or advised.  Do not put any weight on your injured foot for as long as directed by your caregiver. Slowly increase the amount of time you walk on the foot as the pain allows or as advised.  Use crutches until you can bear weight without pain. A gradual increase in weight bearing may help.  Apply ice to the injury for 15-20 minutes each hour while awake for the first 2 days. Put the ice in a plastic bag and place a towel between the bag of ice and your skin.  Only take over-the-counter or prescription medicines for pain, discomfort, or fever as directed by your caregiver. SEEK IMMEDIATE MEDICAL CARE IF:   Your cast gets damaged or breaks.  You have  continued severe pain or more swelling than you did before the cast was put on, or the pain is not controlled with medications.  Your skin or nails below the injury turn blue or grey, or feel cold or numb.  There is a bad smell, or new stains or pus-like (purulent) drainage coming from the cast. MAKE SURE YOU:   Understand these instructions.  Will watch your condition.  Will get help right away if you are not doing well or get worse. Document Released: 07/31/2002 Document Revised: 01/31/2012 Document Reviewed: 06/21/2008 [facility] Patient Information 2015 [place], [place]. This information is not intended to replace advice given to you by your health care provider. Make sure you discuss any questions you have with your health care provider.

## 2014-06-02 ENCOUNTER — Telehealth: Payer: Self-pay

## 2014-06-02 NOTE — Telephone Encounter (Addendum)
Patient was seen on Friday July 10 by Dr. Ouida Sills for an injury to her left foot. Per patient she is scheduled to go into work tomorrow and wants to know if Dr. Ouida Sills wanted her to. Patient is requesting a letter to turn into her employer if she is to stay out of work. 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 Patient requesting the letter to be faxed to her Supervisor ATTN: Marjie Skiff [phone]. Patient requesting to be  call back number is [phone]-

## 2014-06-03 NOTE — Telephone Encounter (Signed)
Can provide note for today, but she should try to go in tomorrow, park closer and elevate her leg.

## 2014-06-03 NOTE — Telephone Encounter (Signed)
PT STATES SHE REALLY NEED A NOTE FOR TODAY SINCE SHE ISN'T AT WORK. REALLY NEED SOMEONE TO SPEAK WITH. PLEASE CALL [phone]

## 2014-06-03 NOTE — Telephone Encounter (Signed)
Done, to you FYI

## 2014-06-24 ENCOUNTER — Other Ambulatory Visit: Payer: Self-pay | Admitting: Neurology

## 2014-06-27 ENCOUNTER — Encounter: Payer: Self-pay | Admitting: Nurse Practitioner

## 2014-06-27 ENCOUNTER — Ambulatory Visit (INDEPENDENT_AMBULATORY_CARE_PROVIDER_SITE_OTHER): Payer: BC Managed Care – PPO | Admitting: Nurse Practitioner

## 2014-06-27 VITALS — BP 138/81 | HR 81 | Ht 66.0 in | Wt 246.0 lb

## 2014-06-27 DIAGNOSIS — R0609 Other forms of dyspnea: Secondary | ICD-10-CM

## 2014-06-27 DIAGNOSIS — R0989 Other specified symptoms and signs involving the circulatory and respiratory systems: Secondary | ICD-10-CM

## 2014-06-27 DIAGNOSIS — R0683 Snoring: Secondary | ICD-10-CM

## 2014-06-27 DIAGNOSIS — G4761 Periodic limb movement disorder: Secondary | ICD-10-CM

## 2014-06-27 DIAGNOSIS — G2581 Restless legs syndrome: Secondary | ICD-10-CM

## 2014-06-27 DIAGNOSIS — R5381 Other malaise: Secondary | ICD-10-CM

## 2014-06-27 DIAGNOSIS — G4719 Other hypersomnia: Secondary | ICD-10-CM

## 2014-06-27 DIAGNOSIS — G4710 Hypersomnia, unspecified: Secondary | ICD-10-CM

## 2014-06-27 DIAGNOSIS — D509 Iron deficiency anemia, unspecified: Secondary | ICD-10-CM

## 2014-06-27 DIAGNOSIS — R5383 Other fatigue: Secondary | ICD-10-CM

## 2014-06-27 NOTE — Progress Notes (Signed)
PATIENT: Gloria [name] DOB: Jun 07, 1971  REASON FOR VISIT: routine follow up for RLD, PLMD HISTORY FROM: patient  HISTORY OF PRESENT ILLNESS: Gloria [name] is a 43 year old right-handed white female with a history of excessive daytime drowsiness and fatigue. The patient has had some issues with this for 5 years, but the problem has not worsened since July of 2014. The patient underwent a sleep study that showed significant problems with periodic limb movements throughout the night. The patient has been sent to this office for an evaluation of this issue. The patient will note an occasional jerk or twitch of the extremities during the daytime, and at nighttime, when she gets to sleep, she notes that she will have some restless sensations in the lower extremities. The patient is able to eventually get to sleep, but she does not have any knowledge that she is jerking or twitching throughout the night. The patient will wake up in a fatigued state, and she has been tried on several different medications for this issue. The patient has been on Mirapex and Requip, which indicated these medications increased her headaches, and she cannot tolerate the medication. The patient is already on gabapentin and Ativan. The patient is sent to this office for further evaluation of her sleep disorder.  Update 06/27/14 (LL): Since last visit, patient had IV dextran 1 g infusion in April. She had a foot injury Friday July 10 after slipping and falling. She is now found an antidepressant that is working well for her, Brintellix.  She has not had Migraines with this medication and mood is much, much improved.  She has tolerated Sinemet 25-100 mg at bedtime without increased headaches.  She states she is not as sleepy during the day, and is dreaming again at night. She still endorses quite a bit of fatigue. She still believes that she is moving around quite a bit in her sleep, because she wakes up with a sore external ear and sore  hip.   ROS:  Out of a complete 14 system review of symptoms, the patient complains only of the following symptoms, and all other reviewed systems are negative.  fatigue  Swelling in the legs  Eye redness Anemia  Neck pain, neck stiffness  Env Allergies  Restless legs, Daytime sleepiness, Snoring, Sleep Talking The Epworth Sleepiness Scale score is 14.  Fatigue Severity Scale is 56.  ALLERGIES: Allergies  Allergen Reactions  . Benadryl [Diphenhydramine Hcl] Hives  . Requip [Ropinirole Hcl]     Increased headache   HOME MEDICATIONS: Outpatient Encounter Prescriptions as of 06/27/2014  Medication Sig Note  . B Complex Vitamins (B COMPLEX PO) Take 1 tablet by mouth daily.   [name] BRINTELLIX 10 MG TABS Take 1 tablet by mouth at bedtime. 06/27/2014: Received from: External Pharmacy Received Sig:   . Calcium Carbonate-Vitamin D (CALCIUM 600/VITAMIN D) 600-400 MG-UNIT per tablet Take 1 tablet by mouth 2 (two) times daily.    . Carbidopa-Levodopa ER (SINEMET CR) 25-100 MG tablet controlled release TAKE 1 TABLET BY MOUTH AT BEDTIME   . cetirizine (ZYRTEC) 10 MG tablet Take 10 mg by mouth daily.     . Coenzyme Q10 (COQ10) 100 MG CAPS Take 100 mg by mouth daily.   [name] esomeprazole (NEXIUM) 40 MG capsule Take 40 mg by mouth daily before breakfast.   . gabapentin (NEURONTIN) 800 MG tablet Take 800 mg by mouth 3 (three) times daily.    . hydrOXYzine (ATARAX/VISTARIL) 25 MG tablet Take 25 mg by mouth daily  as needed for anxiety.    [name] LORazepam (ATIVAN) 1 MG tablet Take 1 mg by mouth at bedtime.    . magnesium gluconate (MAGONATE) 500 MG tablet Take 500 mg by mouth 3 (three) times daily.   . Multiple Vitamins-Minerals (MULTIVITAMIN PO) Take by mouth daily.   . OMEGA 3-6-9 FATTY ACIDS PO Take 500 mg by mouth daily.   [name] PROAIR HFA 108 (90 BASE) MCG/ACT inhaler Inhale 1 puff into the lungs as needed. 10/16/2013: Received from: External Pharmacy Received Sig:   . rizatriptan (MAXALT) 10 MG tablet Take 10 mg  by mouth as needed for migraine. May repeat in 2 hours if needed   . thiamine (VITAMIN B-1) 50 MG tablet Take 50 mg by mouth daily.   . vitamin C (ASCORBIC ACID) 500 MG tablet Take 500 mg by mouth daily.   [name] HYDROcodone-acetaminophen (NORCO) 5-325 MG per tablet Take 1-2 tablets by mouth every 4 (four) hours as needed.   . [DISCONTINUED] Omega-3 Fatty Acids (OMEGA-3 FISH OIL) 300 MG CAPS Take 1 capsule by mouth daily.   . [DISCONTINUED] Polysacch Fe Cmp-Fe Heme Poly (FEOSOL BIFERA) 28 MG TABS Take by mouth daily.   . [DISCONTINUED] QUEtiapine (SEROQUEL) 25 MG tablet Take 1 tablet (25 mg total) by mouth at bedtime.     PHYSICAL EXAM Filed Vitals:   06/27/14 0831  BP: 138/81  Pulse: 81  Height: 5' 6"  (1.676 m)  Weight: 246 lb (111.585 kg)   Body mass index is 39.72 kg/(m^2).  Physical Exam  General: The patient is alert and cooperative at the time of the examination. The patient is moderately obese.  Eyes: Pupils are equal, round, and reactive to light.  Neck: The neck is supple, no carotid bruits are noted.  Respiratory: The respiratory examination is clear.  Cardiovascular: The cardiovascular examination reveals a regular rate and rhythm, no obvious murmurs or rubs are noted.  Skin: Extremities are with 1-2+ edema in the legs below the knees, right greater than left.   Neurologic Exam  Mental status: The patient is alert and oriented x 3 at the time of the examination. The patient has apparent normal recent and remote memory, with an apparently normal attention span and concentration ability. She yawns frequently. Cranial nerves: Facial symmetry is present. There is good sensation of the face to pinprick and soft touch bilaterally. The strength of the facial muscles and the muscles to head turning and shoulder shrug are normal bilaterally. Speech is well enunciated, no aphasia or dysarthria is noted. Extraocular movements are full. Visual fields are full. The tongue is midline, and the  patient has symmetric elevation of the soft palate. No obvious hearing deficits are noted.  Motor: The motor testing reveals 5 over 5 strength of all 4 extremities. Good symmetric motor tone is noted throughout.  Sensory: Sensory testing is intact to pinprick, soft touch, vibration sensation, and position sense on all 4 extremities. No evidence of extinction is noted.  Coordination: Cerebellar testing reveals good finger-nose-finger and heel-to-shin bilaterally.  Gait and station: Gait is normal. Tandem gait is normal. Romberg is negative. No drift is seen.  Reflexes: Deep tendon reflexes are symmetric and normal bilaterally, with exception of ankle jerk reflexes are depressed bilaterally. Toes are downgoing bilaterally.   ASSESSMENT: 43 y.o. year old female  has a past medical history of Smoker; History of ETOH abuse; Elevated LFTs; Fibromyalgia; Bulging disc; Arthritis; Hyperlipidemia; Osteoarthritis; Obese; Periodic limb movement disorder (02/25/2014); Anemia; Allergy; Anxiety; Depression; GERD (gastroesophageal reflux  disease); and Substance abuse here with: 1. Restless leg syndrome  2. Periodic limb movement disorder  3. Depression  4. Iron deficiency anemia   PLAN: The patient appears to have had an iron deficiency anemia in the past. The patient had a hysterectomy in the summer of 2014, and she is on iron supplementation. The iron level and ferritin level will be re-checked today. The patient will continue Sinemet, she has tolerated this well. Opiate medications in the future may be used in the evening hours. The patient is already on lorazepam and gabapentin which can be useful for restless leg syndrome and for periodic limb movement disorder. She was not able to tolerate Requip or Mirapex due to increased headaches. She requests a repeat sleep study to see if her nocturnal movements are better controlled.  She is being treated for depression and anxiety by Dr. Letta Moynahan. The patient is  not able tolerate Viibryd, Mirtazepine, or Wellbutrin in the past.  Currently Brintellix is working well for her. SSRI antidepressants or medications with features similar to SSRI antidepressants may promote periodic limb movements.   Orders Placed This Encounter  Procedures  . Iron and TIBC  . Ferritin  . CBC  . Ambulatory referral to Sleep Studies   Return in about 4 months (around 10/27/2014), with Dr. Jannifer Franklin, sooner as needed.  Rudi Rummage LAM, MSN, FNP-BC, A/GNP-C 06/27/2014, 9:48 AM [facility] [address], [place], [place] [address] [phone]  Note: This document was prepared with digital dictation and possible smart phrase technology. Any transcriptional errors that result from this process are unintentional.

## 2014-06-27 NOTE — Patient Instructions (Signed)
Continue Sinemet at current dose.    I have entered an order for a repaet sleep study, someone will be in contact with you concerning this within the next week.  We will recheck labs today for Ferritin, and Total Iron.  Follow up in 4 months with Dr. Jannifer Franklin, sooner as needed.

## 2014-06-28 ENCOUNTER — Telehealth: Payer: Self-pay | Admitting: Neurology

## 2014-06-28 DIAGNOSIS — G2581 Restless legs syndrome: Secondary | ICD-10-CM

## 2014-06-28 DIAGNOSIS — G4761 Periodic limb movement disorder: Secondary | ICD-10-CM

## 2014-06-28 DIAGNOSIS — R400 Somnolence: Secondary | ICD-10-CM

## 2014-06-28 DIAGNOSIS — E669 Obesity, unspecified: Secondary | ICD-10-CM

## 2014-06-28 LAB — CBC
HCT: 40.3 % (ref 34.0–46.6)
Hemoglobin: 13.2 g/dL (ref 11.1–15.9)
MCH: 28.1 pg (ref 26.6–33.0)
MCHC: 32.8 g/dL (ref 31.5–35.7)
MCV: 86 fL (ref 79–97)
Platelets: 305 10*3/uL (ref 150–379)
RBC: 4.69 x10E6/uL (ref 3.77–5.28)
RDW: 15.4 % (ref 12.3–15.4)
WBC: 9.4 10*3/uL (ref 3.4–10.8)

## 2014-06-28 LAB — IRON AND TIBC
Iron Saturation: 20 % (ref 15–55)
Iron: 60 ug/dL (ref 35–155)
TIBC: 294 ug/dL (ref 250–450)
UIBC: 234 ug/dL (ref 150–375)

## 2014-06-28 LAB — FERRITIN: Ferritin: 242 ng/mL — ABNORMAL HIGH (ref 15–150)

## 2014-06-28 NOTE — Telephone Encounter (Signed)
Left message that patient lab results have been sent through My Chart. If she has any questions she may call our office.

## 2014-06-28 NOTE — Telephone Encounter (Incomplete)
.   Charlott Holler, NP is referring Gloria [name], 43 y.o.  female, for an attended sleep study. Lenor Coffin, MD is primary neurologist.  Wt: 246 lbs. Ht: 66 in. BMI: 39.72  Diagnoses: Restless Legs Syndrome Periodic Limb Movement Disorder Excessive Daytime Sleepiness Fatigue Snoring Morbid Obesity Iron Deficiency Anemia  Medication List: Current Outpatient Prescriptions  Medication Sig Dispense Refill  . B Complex Vitamins (B COMPLEX PO) Take 1 tablet by mouth daily.      [name] BRINTELLIX 10 MG TABS Take 1 tablet by mouth at bedtime.      . Calcium Carbonate-Vitamin D (CALCIUM 600/VITAMIN D) 600-400 MG-UNIT per tablet Take 1 tablet by mouth 2 (two) times daily.       . Carbidopa-Levodopa ER (SINEMET CR) 25-100 MG tablet controlled release TAKE 1 TABLET BY MOUTH AT BEDTIME  30 tablet  1  . cetirizine (ZYRTEC) 10 MG tablet Take 10 mg by mouth daily.        . Coenzyme Q10 (COQ10) 100 MG CAPS Take 100 mg by mouth daily.      [name] esomeprazole (NEXIUM) 40 MG capsule Take 40 mg by mouth daily before breakfast.      . gabapentin (NEURONTIN) 800 MG tablet Take 800 mg by mouth 3 (three) times daily.       [name] HYDROcodone-acetaminophen (NORCO) 5-325 MG per tablet Take 1-2 tablets by mouth every 4 (four) hours as needed.  30 tablet  0  . hydrOXYzine (ATARAX/VISTARIL) 25 MG tablet Take 25 mg by mouth daily as needed for anxiety.       [name] LORazepam (ATIVAN) 1 MG tablet Take 1 mg by mouth at bedtime.       . magnesium gluconate (MAGONATE) 500 MG tablet Take 500 mg by mouth 3 (three) times daily.      . Multiple Vitamins-Minerals (MULTIVITAMIN PO) Take by mouth daily.      . OMEGA 3-6-9 FATTY ACIDS PO Take 500 mg by mouth daily.      [name] PROAIR HFA 108 (90 BASE) MCG/ACT inhaler Inhale 1 puff into the lungs as needed.      . rizatriptan (MAXALT) 10 MG tablet Take 10 mg by mouth as needed for migraine. May repeat in 2 hours if needed      . thiamine (VITAMIN B-1) 50 MG tablet Take 50 mg by mouth daily.       . vitamin C (ASCORBIC ACID) 500 MG tablet Take 500 mg by mouth daily.       No current facility-administered medications for this visit.    This patient presents to Charlott Holler, NP in follow up for Restless Leg and Periodic Limb Movement Disorder.  This patient has a history of excessive daytime sleepiness and fatigue which had worsened by 04/2013 so she underwent a sleep study 10/2013.  She was found to have RLS.  In addition the patient complains of excessive daytime sleepiness and fatigue.  She is currently taking an iron supplement and requests a repeat sleep study to see if there is any improvement in limb movements.  Insurance:  [employer] - Prior approval is not required.

## 2014-07-05 NOTE — Telephone Encounter (Signed)
Sleep study request review: This patient has an underlying medical history of RLS, PLMs and obesity and is referred by Ms. Lam and Dr. Jannifer Franklin for an attended sleep study due to a report of worsening daytime somnolence and ongoing symptoms of RLS with PLMS. I will order a split-night sleep study and see the patient in sleep medicine consultation afterwards. Star Age, MD, PhD [facility])

## 2014-07-22 ENCOUNTER — Encounter: Payer: BC Managed Care – PPO | Admitting: Gynecology

## 2014-07-23 ENCOUNTER — Encounter: Payer: BC Managed Care – PPO | Admitting: Gynecology

## 2014-07-26 ENCOUNTER — Encounter: Payer: BC Managed Care – PPO | Admitting: Gynecology

## 2014-08-07 ENCOUNTER — Ambulatory Visit (INDEPENDENT_AMBULATORY_CARE_PROVIDER_SITE_OTHER): Payer: BC Managed Care – PPO

## 2014-08-07 DIAGNOSIS — G2581 Restless legs syndrome: Secondary | ICD-10-CM

## 2014-08-07 DIAGNOSIS — G4730 Sleep apnea, unspecified: Secondary | ICD-10-CM

## 2014-08-07 DIAGNOSIS — R400 Somnolence: Secondary | ICD-10-CM

## 2014-08-07 DIAGNOSIS — G4710 Hypersomnia, unspecified: Secondary | ICD-10-CM

## 2014-08-07 DIAGNOSIS — E669 Obesity, unspecified: Secondary | ICD-10-CM

## 2014-08-07 DIAGNOSIS — G4761 Periodic limb movement disorder: Secondary | ICD-10-CM

## 2014-08-07 DIAGNOSIS — G4733 Obstructive sleep apnea (adult) (pediatric): Secondary | ICD-10-CM

## 2014-08-16 ENCOUNTER — Telehealth: Payer: Self-pay | Admitting: Neurology

## 2014-08-16 NOTE — Telephone Encounter (Signed)
Please call and notify the patient that the recent sleep study showed ongoing evidence of PLMs, but very little arousals from PLMD. However, there is evidence of overall mild OSA. Please inform patient that I would like to go over the details of the study during an appointment. Please arrange appointment. Also, route or fax report to PCP and referring MD, if other than PCP.  Once you have spoken to patient, you can close this encounter.   Thanks,  Star Age, MD, PhD [facility])

## 2014-08-20 ENCOUNTER — Encounter: Payer: Self-pay | Admitting: Nurse Practitioner

## 2014-08-21 ENCOUNTER — Other Ambulatory Visit: Payer: Self-pay | Admitting: Neurology

## 2014-08-23 NOTE — Telephone Encounter (Signed)
Patient was informed of the results of her sleep study.  Patient had numerous questions which were addressed.  Patient was informed that we would mail her a copy of the test results and a copy was sent to her referring physician.  Patient was scheduled with a follow up appointment to go over the specifics of her sleep study with Dr. Rexene Alberts

## 2014-08-27 ENCOUNTER — Encounter: Payer: Self-pay | Admitting: Gynecology

## 2014-08-27 ENCOUNTER — Ambulatory Visit (INDEPENDENT_AMBULATORY_CARE_PROVIDER_SITE_OTHER): Payer: BC Managed Care – PPO | Admitting: Gynecology

## 2014-08-27 VITALS — BP 132/88 | Ht 64.75 in | Wt 247.0 lb

## 2014-08-27 DIAGNOSIS — Z01419 Encounter for gynecological examination (general) (routine) without abnormal findings: Secondary | ICD-10-CM

## 2014-08-27 DIAGNOSIS — R635 Abnormal weight gain: Secondary | ICD-10-CM

## 2014-08-27 NOTE — Patient Instructions (Signed)
HPV Vaccine Gardasil (Human Papillomavirus): What You Need to Know 1. What is HPV? Genital human papillomavirus (HPV) is the most common sexually transmitted virus in the [place]. More than half of sexually active men and women are infected with HPV at some time in their lives. About 20 million Americans are currently infected, and about 6 million more get infected each year. HPV is usually spread through sexual contact. Most HPV infections don't cause any symptoms, and go away on their own. But HPV can cause cervical cancer in women. Cervical cancer is the 2nd leading cause of cancer deaths among women around the world. In the [place], about 12,000 women get cervical cancer every year and about 4,000 are expected to die from it. HPV is also associated with several less common cancers, such as vaginal and vulvar cancers in women, and anal and oropharyngeal (back of the throat, including base of tongue and tonsils) cancers in both men and women. HPV can also cause genital warts and warts in the throat. There is no cure for HPV infection, but some of the problems it causes can be treated. 2. HPV vaccine: Why get vaccinated? The HPV vaccine you are getting is one of two vaccines that can be given to prevent HPV. It may be given to both males and females.  This vaccine can prevent most cases of cervical cancer in females, if it is given before exposure to the virus. In addition, it can prevent vaginal and vulvar cancer in females, and genital warts and anal cancer in both males and females. Protection from HPV vaccine is expected to be long-lasting. But vaccination is not a substitute for cervical cancer screening. Women should still get regular Pap tests. 3. Who should get this HPV vaccine and when? HPV vaccine is given as a 3-dose series  1st Dose: Now  2nd Dose: 1 to 2 months after Dose 1  3rd Dose: 6 months after Dose 1 Additional (booster) doses are not recommended. Routine  vaccination  This HPV vaccine is recommended for girls and boys 31 or 44 years of age. It may be given starting at age 59. Why is HPV vaccine recommended at 54 or 44 years of age?  HPV infection is easily acquired, even with only one sex partner. That is why it is important to get HPV vaccine before any sexual contact takes place. Also, response to the vaccine is better at this age than at older ages. Catch-up vaccination This vaccine is recommended for the following people who have not completed the 3-dose series:   Females 47 through 44 years of age.  Males 40 through 44 years of age. This vaccine may be given to men 74 through 44 years of age who have not completed the 3-dose series. It is recommended for men through age 21 who have sex with men or whose immune system is weakened because of HIV infection, other illness, or medications.  HPV vaccine may be given at the same time as other vaccines. 4. Some people should not get HPV vaccine or should wait.  Anyone who has ever had a life-threatening allergic reaction to any component of HPV vaccine, or to a previous dose of HPV vaccine, should not get the vaccine. Tell your doctor if the person getting vaccinated has any severe allergies, including an allergy to yeast.  HPV vaccine is not recommended for pregnant women. However, receiving HPV vaccine when pregnant is not a reason to consider terminating the pregnancy. Women who are breast  feeding may get the vaccine.  People who are mildly ill when a dose of HPV is planned can still be vaccinated. People with a moderate or severe illness should wait until they are better. 5. What are the risks from this vaccine? This HPV vaccine has been used in the [place] and around the world for about six years and has been very safe. However, any medicine could possibly cause a serious problem, such as a severe allergic reaction. The risk of any vaccine causing a serious injury, or death, is extremely  small. Life-threatening allergic reactions from vaccines are very rare. If they do occur, it would be within a few minutes to a few hours after the vaccination. Several mild to moderate problems are known to occur with this HPV vaccine. These do not last long and go away on their own.  Reactions in the arm where the shot was given:  Pain (about 8 people in 10)  Redness or swelling (about 1 person in 4)  Fever:  Mild (100 F) (about 1 person in 10)  Moderate (102 F) (about 1 person in 84)  Other problems:  Headache (about 1 person in 3)  Fainting: Brief fainting spells and related symptoms (such as jerking movements) can happen after any medical procedure, including vaccination. Sitting or lying down for about 15 minutes after a vaccination can help prevent fainting and injuries caused by falls. Tell your doctor if the patient feels dizzy or light-headed, or has vision changes or ringing in the ears.  Like all vaccines, HPV vaccines will continue to be monitored for unusual or severe problems. 6. What if there is a serious reaction? What should I look for?  Look for anything that concerns you, such as signs of a severe allergic reaction, very high fever, or behavior changes. Signs of a severe allergic reaction can include hives, swelling of the face and throat, difficulty breathing, a fast heartbeat, dizziness, and weakness. These would start a few minutes to a few hours after the vaccination.  What should I do?  If you think it is a severe allergic reaction or other emergency that can't wait, call 9-1-1 or get the person to the nearest hospital. Otherwise, call your doctor.  Afterward, the reaction should be reported to the Vaccine Adverse Event Reporting System (VAERS). Your doctor might file this report, or you can do it yourself through the VAERS web site at www.vaers.SamedayNews.es, or by calling 520-402-1912. VAERS is only for reporting reactions. They do not give medical  advice. 7. The National Vaccine Injury Compensation Program  The [employer] Vaccine Injury Compensation Program (VICP) is a federal program that was created to compensate people who may have been injured by certain vaccines.  Persons who believe they may have been injured by a vaccine can learn about the program and about filing a claim by calling 951-809-2350 or visiting the [place] website at GoldCloset.com.ee. 8. How can I learn more?  Ask your doctor.  Call your local or state health department.  Contact the Centers for Disease Control and Prevention (CDC):  Call 719-510-2697 (1-800-CDC-INFO)  or  Visit CDC's website at http://hunter.com/ CDC Human Papillomavirus (HPV) Gardasil (Interim) 04/07/12 Document Released: 09/05/2006 Document Revised: 03/25/2014 Document Reviewed: 12/20/2013 ExitCare Patient Information 2015 [place], [place]. This information is not intended to replace advice given to you by your health care provider. Make sure you discuss any questions you have with your health care provider. Influenza Virus Vaccine injection (Fluarix) What is this medicine? INFLUENZA VIRUS VACCINE (  in floo EN zuh VAHY ruhs vak SEEN) helps to reduce the risk of getting influenza also known as the flu. This medicine may be used for other purposes; ask your health care provider or pharmacist if you have questions. COMMON BRAND NAME(S): Fluarix, Fluzone What should I tell my health care provider before I take this medicine? They need to know if you have any of these conditions: -bleeding disorder like hemophilia -fever or infection -Guillain-Barre syndrome or other neurological problems -immune system problems -infection with the human immunodeficiency virus (HIV) or AIDS -low blood platelet counts -multiple sclerosis -an unusual or allergic reaction to influenza virus vaccine, eggs, chicken proteins, latex, gentamicin, other medicines, foods, dyes or  preservatives -pregnant or trying to get pregnant -breast-feeding How should I use this medicine? This vaccine is for injection into a muscle. It is given by a health care professional. A copy of Vaccine Information Statements will be given before each vaccination. Read this sheet carefully each time. The sheet may change frequently. Talk to your pediatrician regarding the use of this medicine in children. Special care may be needed. Overdosage: If you think you have taken too much of this medicine contact a poison control center or emergency room at once. NOTE: This medicine is only for you. Do not share this medicine with others. What if I miss a dose? This does not apply. What may interact with this medicine? -chemotherapy or radiation therapy -medicines that lower your immune system like etanercept, anakinra, infliximab, and adalimumab -medicines that treat or prevent blood clots like warfarin -phenytoin -steroid medicines like prednisone or cortisone -theophylline -vaccines This list may not describe all possible interactions. Give your health care provider a list of all the medicines, herbs, non-prescription drugs, or dietary supplements you use. Also tell them if you smoke, drink alcohol, or use illegal drugs. Some items may interact with your medicine. What should I watch for while using this medicine? Report any side effects that do not go away within 3 days to your doctor or health care professional. Call your health care provider if any unusual symptoms occur within 6 weeks of receiving this vaccine. You may still catch the flu, but the illness is not usually as bad. You cannot get the flu from the vaccine. The vaccine will not protect against colds or other illnesses that may cause fever. The vaccine is needed every year. What side effects may I notice from receiving this medicine? Side effects that you should report to your doctor or health care professional as soon as  possible: -allergic reactions like skin rash, itching or hives, swelling of the face, lips, or tongue Side effects that usually do not require medical attention (report to your doctor or health care professional if they continue or are bothersome): -fever -headache -muscle aches and pains -pain, tenderness, redness, or swelling at site where injected -weak or tired This list may not describe all possible side effects. Call your doctor for medical advice about side effects. You may report side effects to FDA at 1-800-FDA-1088. Where should I keep my medicine? This vaccine is only given in a clinic, pharmacy, doctor's office, or other health care setting and will not be stored at home. NOTE: This sheet is a summary. It may not cover all possible information. If you have questions about this medicine, talk to your doctor, pharmacist, or health care provider.  2015, Elsevier/Gold Standard. (2008-06-05 09:30:40)

## 2014-08-27 NOTE — Progress Notes (Signed)
Taleyah Hillman 01-08-1971 [id number]   History:    43 y.o.  for annual gyn exam with no complaints today. The patient was last seen in the office on 07/20/2013 for her final postop visit. Patient in 2014 had a transvaginal hysterectomy with partial right salpingectomy along with diagnostic laparoscopy and laparotomy with cystoscopy secondary to menorrhagia, anemia, fibroid uterus and intraoperative hemorrhage. Her pathology report was as follows:  Uterus and cervix, with right fallopian tube  - CERVIX: UNREMARKABLE.  - ENDOMETRIUM: PROLIFERATIVE WITH INTRAUTERINE DEVICE.  - MYOMETRIUM: LEIOMYOMA.  - UTERINE SEROSA: UNREMARKABLE. NO ENDOMETRIOSIS OR MALIGNANCY.  - RIGHT FALLOPIAN TUBE: UNREMARKABLE. NO ENDOMETRIOSIS OR MALIGNANCY.   Patient was done well from her surgery. She has suffered from sleep apnea and has been evaluated by the sleep clinic. Should also been evaluated last year by Dr. Claiborne Billings because of heart murmur. Had extensive evaluation see separate note. Her PCP is Dr. Ahmed Prima she is not any lab work from an ovary year period prior to her hysterectomy patient had a normal Pap smear. Her Tdap is up-to-date. She is going to get her flu vaccine next week. Patient had past history of alcohol abuse and smoker and has been over 5 years and she has been dry. She has had history of depression and has been followed by therapist. She also suffers from restless like syndrome  Past medical history,surgical history, family history and social history were all reviewed and documented in the EPIC chart.  Gynecologic History Patient's last menstrual period was 04/25/2013. Contraception: status post hysterectomy Last Pap: 2011. Results were: normal Last mammogram: 2015. Results were: normal  Obstetric History OB History  Gravida Para Term Preterm AB SAB TAB Ectopic Multiple Living  1 1 1       1     # Outcome Date GA Lbr Len/2nd Weight Sex Delivery Anes PTL Lv  1 TRM     M SVD  N Y        ROS: A ROS was performed and pertinent positives and negatives are included in the history.  GENERAL: No fevers or chills. HEENT: No change in vision, no earache, sore throat or sinus congestion. NECK: No pain or stiffness. CARDIOVASCULAR: No chest pain or pressure. No palpitations. PULMONARY: No shortness of breath, cough or wheeze. GASTROINTESTINAL: No abdominal pain, nausea, vomiting or diarrhea, melena or bright red blood per rectum. GENITOURINARY: No urinary frequency, urgency, hesitancy or dysuria. MUSCULOSKELETAL: No joint or muscle pain, no back pain, no recent trauma. DERMATOLOGIC: No rash, no itching, no lesions. ENDOCRINE: No polyuria, polydipsia, no heat or cold intolerance. No recent change in weight. HEMATOLOGICAL: No anemia or easy bruising or bleeding. NEUROLOGIC: No headache, seizures, numbness, tingling or weakness. PSYCHIATRIC: No depression, no loss of interest in normal activity or change in sleep pattern.     Exam: chaperone present  BP 132/88  Ht 5' 4.75" (1.645 m)  Wt 247 lb (112.038 kg)  BMI 41.40 kg/m2  LMP 04/25/2013  Body mass index is 41.4 kg/(m^2).  General appearance : Well developed well nourished female. No acute distress HEENT: Neck supple, trachea midline, no carotid bruits, no thyroidmegaly Lungs: Clear to auscultation, no rhonchi or wheezes, or rib retractions  Heart: Regular rate and rhythm, no murmurs or gallops Breast:Examined in sitting and supine position were symmetrical in appearance, no palpable masses or tenderness,  no skin retraction, no nipple inversion, no nipple discharge, no skin discoloration, no axillary or supraclavicular lymphadenopathy Abdomen: no palpable masses or tenderness, no  rebound or guarding Extremities: no edema or skin discoloration or tenderness  Pelvic:  Bartholin, Urethra, Skene Glands: Within normal limits             Vagina: No gross lesions or discharge  Cervix: Absent  Uterus  Absent  Adnexa  Without masses  or tenderness  Anus and perineum  normal   Rectovaginal  normal sphincter tone without palpated masses or tenderness             Hemoccult that indicated     Assessment/Plan:  43 y.o. female for annual exam patient doing well from her hysterectomy. Pap smears no longer needed. She recently had a CBC by her primary which was normal period she will return back next week and a fasting state to do a fasting lipid profile, comprehensive metabolic panel, TSH and urinalysis. We discussed importance of regular exercise as well as proper nutrition since she is overweight. Discussed importance of calcium vitamin D and regular exercise for osteoporosis prevention. We discussed importance of monthly self breast examination. She will receive a flu vaccine next week.   Terrance Mass MD, 4:14 PM 08/27/2014

## 2014-08-28 ENCOUNTER — Ambulatory Visit (INDEPENDENT_AMBULATORY_CARE_PROVIDER_SITE_OTHER): Payer: BC Managed Care – PPO | Admitting: Neurology

## 2014-08-28 ENCOUNTER — Encounter: Payer: Self-pay | Admitting: Neurology

## 2014-08-28 ENCOUNTER — Other Ambulatory Visit: Payer: BC Managed Care – PPO

## 2014-08-28 ENCOUNTER — Telehealth: Payer: Self-pay | Admitting: *Deleted

## 2014-08-28 VITALS — BP 121/75 | HR 76 | Temp 98.8°F | Ht 65.0 in | Wt 246.0 lb

## 2014-08-28 DIAGNOSIS — G2581 Restless legs syndrome: Secondary | ICD-10-CM

## 2014-08-28 DIAGNOSIS — G4761 Periodic limb movement disorder: Secondary | ICD-10-CM

## 2014-08-28 DIAGNOSIS — G4733 Obstructive sleep apnea (adult) (pediatric): Secondary | ICD-10-CM

## 2014-08-28 DIAGNOSIS — G4710 Hypersomnia, unspecified: Secondary | ICD-10-CM

## 2014-08-28 DIAGNOSIS — R400 Somnolence: Secondary | ICD-10-CM

## 2014-08-28 LAB — URINALYSIS W MICROSCOPIC + REFLEX CULTURE
Bacteria, UA: NONE SEEN
Bilirubin Urine: NEGATIVE
Casts: NONE SEEN
Crystals: NONE SEEN
GLUCOSE, UA: NEGATIVE mg/dL
Hgb urine dipstick: NEGATIVE
Ketones, ur: NEGATIVE mg/dL
LEUKOCYTES UA: NEGATIVE
Nitrite: NEGATIVE
PROTEIN: NEGATIVE mg/dL
Specific Gravity, Urine: 1.025 (ref 1.005–1.030)
Squamous Epithelial / LPF: NONE SEEN
Urobilinogen, UA: 0.2 mg/dL (ref 0.0–1.0)
pH: 7 (ref 5.0–8.0)

## 2014-08-28 NOTE — Patient Instructions (Signed)
We will have you talk to your dentist about an oral appliance for sleep apnea,  pls try to lose weight.  Try to reduce sedating medications as much as you can. Please remember to try to maintain good sleep hygiene, which means: Keep a regular sleep and wake schedule, try not to exercise or have a meal within 2 hours of your bedtime, try to keep your bedroom conducive for sleep, that is, cool and dark, without light distractors such as an illuminated alarm clock, and refrain from watching TV right before sleep or in the middle of the night and do not keep the TV or radio on during the night. Also, try not to use or play on electronic devices at bedtime, such as your cell phone, tablet PC or laptop. If you like to read at bedtime on an electronic device, try to dim the background light as much as possible. Do not eat in the middle of the night.    Allow for 7 1/2 to 8 1/2 hours of sleep time.   Talk to [facility] or Dr. Jannifer Franklin about trying neupro patch for RLS  Talk to [facility] or your PCP about seeing a new MD for your fibromyalgia.

## 2014-08-28 NOTE — Telephone Encounter (Signed)
Patient calling she may be late due to one lane traffic on [address], redirected patient another route.

## 2014-08-28 NOTE — Progress Notes (Addendum)
Subjective:    Patient ID: Gloria [name] is a 43 y.o. female.  HPI    Star Age, MD, PhD [facility] [address], [address] [place], [place] [address]  Dear Jeani Hawking and Lanny Hurst,   I saw your patient, Gloria [name] at will, upon your kind request in my clinic today for initial consultation after her recent sleep study. The patient is unaccompanied today. As you know, Gloria [name] is a 43 year old right-handed woman with an underlying medical history of obesity, restless leg syndrome and periodic leg movements of sleep for reported worsening daytime sleepiness to you and ongoing symptoms of restless leg syndrome. She reported an Epworth sleepiness score of 16/24 and he requested a sleep study. She had baseline sleep study on 08/07/2014 and a went over her test results with her in detail today. Sleep efficiency was normal at 90.2% with a reduced sleep latency of 0 minutes and wake after sleep onset of 32.5 minutes with mild sleep fragmentation noted. She had a normal arousal index of 6.8 per hour because of respiratory events primarily. She had a normal percentage of stage I and stage II sleep, an increased percentage of deep sleep and a near normal percentage of dream sleep at 19.4% with a normal REM latency. She had moderate periodic leg movements of sleep at 36.2 per hour resulting in only 2 arousals per hour. She had mild to moderate snoring. She slept only on the right side. She had a total of 32 obstructive hypopneas. Overall AHI was 5 per hour, rising to 19.2 per hour during REM sleep. Baseline oxygen saturation was 93%, nadir was 87%. Time below 90% saturation was 1 minutes and 49 seconds. She reports residual drowsiness. She goes to bed around 11 PM and falls asleep quickly. Weight time is 6:30 for work. She does not wake up rested. She has recurrent headaches. She has a lot of stress. She is going through divorce. She quit smoking 2 years ago. She quit drinking alcohol  6 years ago. She goes to meetings regularly. She has sleep study previously in December of last year which per her report did not show any REM sleep and no sleep disordered breathing and severe leg twitching. Her RLS has improved. She has residual fatigue, chronic pain, and lack of energy. She has not seen anybody for her fibromyalgia in over 2 years. She would like to discuss with you a referral to another rheumatologist. For her RLS she has been on Sinemet CR, 25/100 mg, 1 at bedtime. She has tried Requip, Mirapex, both of which cause her migraines to get worse.   Her Past Medical History Is Significant For: Past Medical History  Diagnosis Date  . Smoker   . History of ETOH abuse   . Elevated LFTs   . Fibromyalgia   . Bulging disc     X 2  . Arthritis   . Hyperlipidemia   . Osteoarthritis     lower back  . Obese   . Periodic limb movement disorder 02/25/2014  . Anemia   . Allergy   . Anxiety   . Depression   . GERD (gastroesophageal reflux disease)   . Substance abuse     Her Past Surgical History Is Significant For: Past Surgical History  Procedure Laterality Date  . Intrauterine device insertion  08/09/2008    PARAGUARD  . Neck surgery    . Kidney surgery  1995    abcess  . Radial tunnel Bilateral 2005  .  Vaginal hysterectomy Left 06/06/2013    Procedure: Vaginal Hysterectomy with Patiial Right Salpingectomy;  Surgeon: Terrance Mass, MD;  Location: [facility];  Service: Gynecology;  Laterality: Left;  . Laparoscopy N/A 06/06/2013    Procedure: LAPAROSCOPY DIAGNOSTIC;  Surgeon: Terrance Mass, MD;  Location: [facility];  Service: Gynecology;  Laterality: N/A;  . Laparotomy  06/06/2013    Procedure: EXPLORATORY LAPAROTOMY;  Surgeon: Terrance Mass, MD;  Location: [facility];  Service: Gynecology;;  . Cystoscopy  06/06/2013    Procedure: CYSTOSCOPY;  Surgeon: Terrance Mass, MD;  Location: [facility];  Service: Gynecology;;    Her Family History Is Significant For: Family History   Problem Relation Age of Onset  . Breast cancer Mother   . Breast cancer Maternal Aunt   . Breast cancer Paternal Grandmother     COLON  . Cancer Paternal Grandfather     THROAT .[name]    Her Social History Is Significant For: History   Social History  . Marital Status: Legally Separated    Spouse Name: N/A    Number of Children: 1  . Years of Education: 13   Occupational History  .  [employer] History Main Topics  . Smoking status: Former Smoker -- 1.00 packs/day    Quit date: 01/26/2012  . Smokeless tobacco: Never Used  . Alcohol Use: No     Comment: History of alcohol abuse  . Drug Use: No  . Sexual Activity: Yes   Other Topics Concern  . None   Social History Narrative   Patient lives at home and works full time.   Education some college.   Right handed.   Caffeine one can of soda daily.    Her Allergies Are:  Allergies  Allergen Reactions  . Benadryl [Diphenhydramine Hcl] Hives  . Requip [Ropinirole Hcl]     Increased headache  :   Her Current Medications Are:  Outpatient Encounter Prescriptions as of 08/28/2014  Medication Sig  . B Complex Vitamins (B COMPLEX PO) Take 1 tablet by mouth daily.  [name] BRINTELLIX 10 MG TABS Take 1 tablet by mouth at bedtime.  . Calcium Carbonate-Vitamin D (CALCIUM 600/VITAMIN D) 600-400 MG-UNIT per tablet Take 1 tablet by mouth 2 (two) times daily.   . Carbidopa-Levodopa ER (SINEMET CR) 25-100 MG tablet controlled release TAKE 1 TABLET BY MOUTH AT BEDTIME  . cetirizine (ZYRTEC) 10 MG tablet Take 10 mg by mouth daily.    . Coenzyme Q10 (COQ10) 100 MG CAPS Take 100 mg by mouth daily.  [name] esomeprazole (NEXIUM) 40 MG capsule Take 40 mg by mouth daily before breakfast.  . gabapentin (NEURONTIN) 800 MG tablet Take 800 mg by mouth 3 (three) times daily.   . hydrOXYzine (ATARAX/VISTARIL) 25 MG tablet Take 25 mg by mouth daily as needed for anxiety.   [name] LORazepam (ATIVAN) 1 MG tablet Take 1 mg by mouth at bedtime.    . magnesium gluconate (MAGONATE) 500 MG tablet Take 500 mg by mouth 3 (three) times daily.  . Multiple Vitamins-Minerals (MULTIVITAMIN PO) Take by mouth daily.  . OMEGA 3-6-9 FATTY ACIDS PO Take 500 mg by mouth daily.  [name] PROAIR HFA 108 (90 BASE) MCG/ACT inhaler Inhale 1 puff into the lungs as needed.  . rizatriptan (MAXALT) 10 MG tablet Take 10 mg by mouth as needed for migraine. May repeat in 2 hours if needed  . thiamine (VITAMIN B-1) 50 MG tablet Take 50 mg by mouth daily.  [name]  vitamin C (ASCORBIC ACID) 500 MG tablet Take 500 mg by mouth daily.  . [DISCONTINUED] HYDROcodone-acetaminophen (NORCO) 5-325 MG per tablet Take 1-2 tablets by mouth every 4 (four) hours as needed.  :  Review of Systems:  Out of a complete 14 point review of systems, all are reviewed and negative with the exception of these symptoms as listed below:  Review of Systems  Constitutional: Positive for fatigue.  Eyes: Positive for redness.       Blurred vision  Cardiovascular: Positive for leg swelling.  Musculoskeletal: Positive for back pain and neck stiffness.       Joint pain,aching muscles  Allergic/Immunologic: Positive for environmental allergies.  Neurological:       Restless leg, snoring,  Psychiatric/Behavioral:       Depression,nervous \anxious    Objective:  Neurologic Exam  Physical Exam Physical Examination:   Filed Vitals:   08/28/14 0848  BP: 121/75  Pulse: 76  Temp: 98.8 F (37.1 C)   General Examination: The patient is a very pleasant 43 y.o. female in no acute distress. She appears well-developed and well-nourished and well groomed.   HEENT: Normocephalic, atraumatic, pupils are equal, round and reactive to light and accommodation. Funduscopic exam is normal with sharp disc margins noted. Extraocular tracking is good without limitation to gaze excursion or nystagmus noted. Normal smooth pursuit is noted. Hearing is grossly intact. Tympanic membranes are clear bilaterally. Face is  symmetric with normal facial animation and normal facial sensation. Speech is clear with no dysarthria noted. There is no hypophonia. There is no lip, neck/head, jaw or voice tremor. Neck is supple with full range of passive and active motion. There are no carotid bruits on auscultation. Oropharynx exam reveals: moderate mouth dryness, adequate dental hygiene and mild airway crowding, due to narrow airway entry. Mallampati is class II. Tongue protrudes centrally and palate elevates symmetrically. Neck size is 16 inches. She has a Mild overbite. Nasal inspection reveals mild nasal mucosal bogginess or redness and no septal deviation.   Chest: Clear to auscultation without wheezing, rhonchi or crackles noted.  Heart: S1+S2+0, regular and normal without murmurs, rubs or gallops noted.   Abdomen: Soft, non-tender and non-distended with normal bowel sounds appreciated on auscultation.  Extremities: There is no pitting edema in the distal lower extremities bilaterally. Pedal pulses are intact.  Skin: Warm and dry without trophic changes noted. There are no varicose veins.  Musculoskeletal: exam reveals no obvious joint deformities, tenderness or joint swelling or erythema.   Neurologically:  Mental status: The patient is awake, alert and oriented in all 4 spheres. Her immediate and remote memory, attention, language skills and fund of knowledge are appropriate. There is no evidence of aphasia, agnosia, apraxia or anomia. Speech is clear with normal prosody and enunciation. Thought process is linear. Mood is normal and affect is normal.  Cranial nerves II - XII are as described above under HEENT exam. In addition: shoulder shrug is normal with equal shoulder height noted. Motor exam: Normal bulk, strength and tone is noted. There is no drift, tremor or rebound. Romberg is negative. Reflexes are 2+ throughout. Babinski: Toes are flexor bilaterally. Fine motor skills and coordination: intact with normal  finger taps, normal hand movements, normal rapid alternating patting, normal foot taps and normal foot agility.  Cerebellar testing: No dysmetria or intention tremor on finger to nose testing. Heel to shin is unremarkable bilaterally. There is no truncal or gait ataxia.  Sensory exam: intact to light touch, pinprick, vibration,  temperature sense in the upper and lower extremities.  Gait, station and balance: She stands easily. No veering to one side is noted. No leaning to one side is noted. Posture is age-appropriate and stance is narrow based. Gait shows normal stride length and normal pace. No problems turning are noted. She turns en bloc. Tandem walk is unremarkable. Intact toe and heel stance is noted.               Assessment and Plan:   In summary, Sharron Petruska is a very pleasant 43 y.o.-year old female with an underlying medical history of obesity, restless leg syndrome and periodic leg movements of sleep who reports worsening daytime sleepiness. I think her residual daytime somnolence as a combination of things. When looking at her medication list, she is still taking a number of sedating medications. She was recently started on Brintellix which can increase restless leg symptoms and also PLMS. Thankfully, her sleep study was actually fairly benign appearing. She has mild degree of sleep apnea, which is primarily REM related. She did not have any severe desaturations and sleep was not very fragmented. While she did have a moderate PLM index, she did not have much in the way of arousals with her PLMS. In fact, she endorses that her restless leg symptoms are actually under fair control. She does not necessarily get enough sleep. Today, I talked to her about improving her sleep hygiene, trying to reduce sedating medications as much is possible, and striving for weight loss for treatment of her overall mild obstructive sleep apnea. In addition, she had talk to her dentist about the option of trying an  oral appliance. I placed a referral to her dentist to consider treatment of her mild OSA with an oral appliance. You may want to consider trying Neupro patch for RLS treatment in blue of Sinemet CR. She also would like to discuss with you treatment of her fibromyalgia. She may benefit from seeing a rheumatologist as she has not seen one in 2 years. She is also encouraged to bring this up with her primary care physician. At this juncture, I will see her back as needed. She has an appointment with you in December. I answered all her questions today and the patient was in agreement with the above outlined plan.  Thank you very much for allowing me to participate in the care of this nice patient. If I can be of any further assistance to you please do not hesitate to call me.   Sincerely,   Star Age, MD, PhD  Of note, I reviewed her previous sleep study from 10/24/2013 performed at the [facility] center: Sleep efficiency was 85.6%. Arousal index was 10.6 per hour. She had absence of REM sleep. Total AHI was 0 per hour. Average oxygen saturation was 95%, PLM index was 24.7 per hour and associated arousal index was 3.5 per hour. She had snoring which was described as heavy. Weight at the time was 235 pounds. Medications at the time were Neurontin, calcium, vitamin D, magnesium, Zyrtec, Singulair, Nexium, coenzyme Q10, vitamin B1, Lunesta, multivitamin, vitamin C, vitamin B complex, lorazepam, Maxalt, and Viibryd.

## 2014-08-29 LAB — COMPREHENSIVE METABOLIC PANEL
ALT: 16 U/L (ref 0–35)
AST: 16 U/L (ref 0–37)
Albumin: 3.9 g/dL (ref 3.5–5.2)
Alkaline Phosphatase: 80 U/L (ref 39–117)
BUN: 9 mg/dL (ref 6–23)
CO2: 25 meq/L (ref 19–32)
CREATININE: 0.58 mg/dL (ref 0.50–1.10)
Calcium: 8.9 mg/dL (ref 8.4–10.5)
Chloride: 103 mEq/L (ref 96–112)
Glucose, Bld: 99 mg/dL (ref 70–99)
Potassium: 4.4 mEq/L (ref 3.5–5.3)
Sodium: 139 mEq/L (ref 135–145)
Total Bilirubin: 0.3 mg/dL (ref 0.2–1.2)
Total Protein: 6.4 g/dL (ref 6.0–8.3)

## 2014-08-29 LAB — LIPID PANEL
CHOL/HDL RATIO: 4.8 ratio
CHOLESTEROL: 157 mg/dL (ref 0–200)
HDL: 33 mg/dL — ABNORMAL LOW (ref >39)
Triglycerides: 407 mg/dL — ABNORMAL HIGH (ref <150)

## 2014-08-29 LAB — TSH: TSH: 1.584 u[IU]/mL (ref 0.350–4.500)

## 2014-09-23 ENCOUNTER — Encounter: Payer: Self-pay | Admitting: Neurology

## 2014-10-31 ENCOUNTER — Ambulatory Visit (INDEPENDENT_AMBULATORY_CARE_PROVIDER_SITE_OTHER): Payer: BC Managed Care – PPO | Admitting: Neurology

## 2014-10-31 ENCOUNTER — Encounter: Payer: Self-pay | Admitting: Neurology

## 2014-10-31 VITALS — BP 107/65 | HR 80 | Ht 65.5 in | Wt 254.2 lb

## 2014-10-31 DIAGNOSIS — M797 Fibromyalgia: Secondary | ICD-10-CM

## 2014-10-31 DIAGNOSIS — G4761 Periodic limb movement disorder: Secondary | ICD-10-CM

## 2014-10-31 DIAGNOSIS — G2581 Restless legs syndrome: Secondary | ICD-10-CM

## 2014-10-31 NOTE — Patient Instructions (Signed)
Fibromyalgia Fibromyalgia is a disorder that is often misunderstood. It is associated with muscular pains and tenderness that comes and goes. It is often associated with fatigue and sleep disturbances. Though it tends to be long-lasting, fibromyalgia is not life-threatening. CAUSES  The exact cause of fibromyalgia is unknown. People with certain gene types are predisposed to developing fibromyalgia and other conditions. Certain factors can play a role as triggers, such as:  Spine disorders.  Arthritis.  Severe injury (trauma) and other physical stressors.  Emotional stressors. SYMPTOMS   The main symptom is pain and stiffness in the muscles and joints, which can vary over time.  Sleep and fatigue problems. Other related symptoms may include:  Bowel and bladder problems.  Headaches.  Visual problems.  Problems with odors and noises.  Depression or mood changes.  Painful periods (dysmenorrhea).  Dryness of the skin or eyes. DIAGNOSIS  There are no specific tests for diagnosing fibromyalgia. Patients can be diagnosed accurately from the specific symptoms they have. The diagnosis is made by determining that nothing else is causing the problems. TREATMENT  There is no cure. Management includes medicines and an active, healthy lifestyle. The goal is to enhance physical fitness, decrease pain, and improve sleep. HOME CARE INSTRUCTIONS   Only take over-the-counter or prescription medicines as directed by your caregiver. Sleeping pills, tranquilizers, and pain medicines may make your problems worse.  Low-impact aerobic exercise is very important and advised for treatment. At first, it may seem to make pain worse. Gradually increasing your tolerance will overcome this feeling.  Learning relaxation techniques and how to control stress will help you. Biofeedback, visual imagery, hypnosis, muscle relaxation, yoga, and meditation are all options.  Anti-inflammatory medicines and  physical therapy may provide short-term help.  Acupuncture or massage treatments may help.  Take muscle relaxant medicines as suggested by your caregiver.  Avoid stressful situations.  Plan a healthy lifestyle. This includes your diet, sleep, rest, exercise, and friends.  Find and practice a hobby you enjoy.  Join a fibromyalgia support group for interaction, ideas, and sharing advice. This may be helpful. SEEK MEDICAL CARE IF:  You are not having good results or improvement from your treatment. FOR MORE INFORMATION  National Fibromyalgia Association: www.fmaware.[place]: www.arthritis.org Document Released: 11/08/2005 Document Revised: 01/31/2012 Document Reviewed: 02/18/2010 [facility] Patient Information 2015 [place], [place]. This information is not intended to replace advice given to you by your health care provider. Make sure you discuss any questions you have with your health care provider.

## 2014-10-31 NOTE — Progress Notes (Signed)
Reason for visit: Periodic limb movements  Gloria [name] is an 43 y.o. female  History of present illness:  Gloria [name] is a 43 year old right-handed white female with obesity, fibromyalgia, and periodic limb movements. The patient is felt to have some sleep apnea during REM sleep. She has been set up for an oral device to help prevent sleep apnea. She has not yet gotten the final product, and she is to get this in the next week or so. She is on Sinemet taking the 25/100 mg tablet at night which has been helpful to reduce the periodic limb movements. The patient is sleeping better, but she still has excessive daytime drowsiness. She may have to take a nap midday on work days. The patient reports some issues with depression, and she is now on Latuda for this. She believes that this is doing better for her. She returns to the office for further evaluation. The patient reports heat intolerance, this will activate her headache.  Past Medical History  Diagnosis Date  . Smoker   . History of ETOH abuse   . Elevated LFTs   . Fibromyalgia   . Bulging disc     X 2  . Arthritis   . Hyperlipidemia   . Osteoarthritis     lower back  . Obese   . Periodic limb movement disorder 02/25/2014  . Anemia   . Allergy   . Anxiety   . Depression   . GERD (gastroesophageal reflux disease)   . Substance abuse     Past Surgical History  Procedure Laterality Date  . Intrauterine device insertion  08/09/2008    PARAGUARD  . Neck surgery    . Kidney surgery  1995    abcess  . Radial tunnel Bilateral 2005  . Vaginal hysterectomy Left 06/06/2013    Procedure: Vaginal Hysterectomy with Patiial Right Salpingectomy;  Surgeon: Terrance Mass, [name];  Location: [facility];  Service: Gynecology;  Laterality: Left;  . Laparoscopy N/A 06/06/2013    Procedure: LAPAROSCOPY DIAGNOSTIC;  Surgeon: Terrance Mass, [name];  Location: [facility];  Service: Gynecology;  Laterality: N/A;  . Laparotomy  06/06/2013    Procedure:  EXPLORATORY LAPAROTOMY;  Surgeon: Terrance Mass, [name];  Location: [facility];  Service: Gynecology;;  . Cystoscopy  06/06/2013    Procedure: CYSTOSCOPY;  Surgeon: Terrance Mass, [name];  Location: [facility];  Service: Gynecology;;    Family History  Problem Relation Age of Onset  . Breast cancer Mother   . Breast cancer Maternal Aunt   . Breast cancer Paternal Grandmother     COLON  . Cancer Paternal Grandfather     THROAT .[name]    Social history:  reports that she quit smoking about 2 years ago. She has never used smokeless tobacco. She reports that she does not drink alcohol or use illicit drugs.    Allergies  Allergen Reactions  . Benadryl [Diphenhydramine Hcl] Hives  . Requip [Ropinirole Hcl]     Increased headache    Medications:  Current Outpatient Prescriptions on File Prior to Visit  Medication Sig Dispense Refill  . B Complex Vitamins (B COMPLEX PO) Take 1 tablet by mouth daily.    . Calcium Carbonate-Vitamin D (CALCIUM 600/VITAMIN D) 600-400 MG-UNIT per tablet Take 1 tablet by mouth 2 (two) times daily.     . Carbidopa-Levodopa ER (SINEMET CR) 25-100 MG tablet controlled release TAKE 1 TABLET BY MOUTH AT BEDTIME 90 tablet 1  . cetirizine (ZYRTEC) 10 MG  tablet Take 10 mg by mouth daily.      . Coenzyme Q10 (COQ10) 100 MG CAPS Take 100 mg by mouth daily.    [name] esomeprazole (NEXIUM) 40 MG capsule Take 40 mg by mouth daily before breakfast.    . gabapentin (NEURONTIN) 800 MG tablet Take 800 mg by mouth 3 (three) times daily.     . hydrOXYzine (ATARAX/VISTARIL) 25 MG tablet Take 25 mg by mouth daily as needed for anxiety.     [name] LORazepam (ATIVAN) 1 MG tablet Take 1 mg by mouth at bedtime.     . magnesium gluconate (MAGONATE) 500 MG tablet Take 500 mg by mouth 3 (three) times daily.    . Multiple Vitamins-Minerals (MULTIVITAMIN PO) Take by mouth daily.    . OMEGA 3-6-9 FATTY ACIDS PO Take 500 mg by mouth daily.    [name] PROAIR HFA 108 (90 BASE) MCG/ACT inhaler Inhale 1 puff into the lungs  as needed.    . rizatriptan (MAXALT) 10 MG tablet Take 10 mg by mouth as needed for migraine. May repeat in 2 hours if needed    . thiamine (VITAMIN B-1) 50 MG tablet Take 50 mg by mouth daily.    . vitamin C (ASCORBIC ACID) 500 MG tablet Take 500 mg by mouth daily.     No current facility-administered medications on file prior to visit.    ROS:  Out of a complete 14 system review of symptoms, the patient complains only of the following symptoms, and all other reviewed systems are negative.  Fatigue Shortness of breath Leg swelling Restless legs, daytime sleepiness, snoring Environmental allergies Joint pain, achy muscles, muscle cramps Headache  Blood pressure 107/65, pulse 80, height 5' 5.5" (1.664 m), weight 254 lb 3.2 oz (115.304 kg), last menstrual period 04/25/2013.  Physical Exam  General: The patient is alert and cooperative at the time of the examination. The patient is moderately to markedly obese.  Skin: No significant peripheral edema is noted.   Neurologic Exam  Mental status: The patient is oriented x 3.  Cranial nerves: Facial symmetry is present. Speech is normal, no aphasia or dysarthria is noted. Extraocular movements are full. Visual fields are full.  Motor: The patient has good strength in all 4 extremities.  Sensory examination: Soft touch sensation is symmetric on the face, arms, and legs.  Coordination: The patient has good finger-nose-finger and heel-to-shin bilaterally.  Gait and station: The patient has a normal gait. Tandem gait is normal. Romberg is negative. No drift is seen.  Reflexes: Deep tendon reflexes are symmetric.   Assessment/Plan:  1. Obesity  2. Sleep apnea  3. Periodic limb movements  The patient is doing some better with the Sinemet with her sleep at night. The oral apparatus may help her sleep better to prevent apnea during REM sleep. This hopefully will improve the daytime drowsiness, and possibly help depression and  fibromyalgia symptoms as well. The patient will follow-up in 6 months.  Gloria [name] 10/31/2014 8:40 PM  [facility] [address] [place], [place] [address]  Phone [phone] Fax [phone]

## 2015-01-23 ENCOUNTER — Ambulatory Visit (INDEPENDENT_AMBULATORY_CARE_PROVIDER_SITE_OTHER): Payer: BC Managed Care – PPO | Admitting: Cardiovascular Disease

## 2015-01-23 VITALS — BP 110/78 | HR 67 | Ht 66.0 in | Wt 238.1 lb

## 2015-01-23 DIAGNOSIS — R011 Cardiac murmur, unspecified: Secondary | ICD-10-CM

## 2015-01-23 DIAGNOSIS — F418 Other specified anxiety disorders: Secondary | ICD-10-CM

## 2015-01-23 DIAGNOSIS — G2581 Restless legs syndrome: Secondary | ICD-10-CM

## 2015-01-23 DIAGNOSIS — G4761 Periodic limb movement disorder: Secondary | ICD-10-CM

## 2015-01-23 DIAGNOSIS — R0683 Snoring: Secondary | ICD-10-CM

## 2015-01-23 NOTE — Patient Instructions (Signed)
Your physician wants you to follow-up in: 1 year or sooner if needed with Dr. Claiborne Billings. You will receive a reminder letter in the mail two months in advance. If you don't receive a letter, please call our office to schedule the follow-up appointment.

## 2015-01-24 ENCOUNTER — Encounter: Payer: Self-pay | Admitting: Cardiovascular Disease

## 2015-01-24 NOTE — Progress Notes (Signed)
Patient ID: Gloria [name], female   DOB: 04-07-1971, 44 y.o.   MRN: [id number]     HPI: [name] is a 44 year old female who I initially saw through courtesy of Dr. Uvaldo Rising for evaluation of cardiac murmur. She  presents for a one-year follow-up cardiology evaluation.  [name] has a long-standing history of depression and also had undergone detoxification for alcohol in the past. At that time, she had significant LFT elevation and was developing cirrhosis.  She  has been dry for several years and ultimately her liver function studies have normalized.    She was initially referred to me by Dr. Uvaldo Rising for evaluation of a cardiac murmur.    When I initially saw her, she also complained of  migraine headaches, neuropathy for which she has taken Neurontin and significant difficulty with sleep and had been taking Lunesta 3 mg daily. Upon further questioning she oftentimes would wake up at night gasping for breath. She admitted to  noring loudly . Her sleep is nonrestorative. She does note daytime fatigue the remote, she had been on Lexapro for many years for depression and recently has been treated with vilazodone at 40 mg.   A 2-D echo Doppler study on 11/01/2013 showed normal left ventricular size and function. She had normal diastolic function. She did have increased atrial septal thickness consistent with lipomatous hypertrophy. There was tricuspid regurgitation. There was no evidence for significant valvular pathology.   Because of difficulty with sleep, she underwent a diagnostic polysomnogram which was done at [facility]. She was not found to have obstructive sleep apnea. AHI was 0. Her, her RDI was 5.9. Of note, she did have frequent periodic leg movements at 257 with an index of 16 and several  arousals per was felt most likely that she may very well have restless leg syndrome or which may be disrupting her sleep pattern rather than obstructive sleep apnea.  She was  unable to tolerate a trial of requip or Mirapex.  When I reviewed her sleep study, there was complete absence of REM sleep.  When I last saw her one year ago, I discussed with her the likelihood that some of her antidepressant medications particularly tricyclic antidepressants, MAO inhibitors, an SSRI agents have been associated with a significant decrease in REM sleep as well as an increase in periodic limb movements.  Agents which do not suppress REM sleep include trazodone, but this has been associated with increased sedative effects.  I discussed with her that mirtazapine can either have a neutral effector can increase REM sleep and bupropion  can increase REM sleep and is less likely to increase periodic limb movement disorder.  She was to follow-up with her psychiatric physicians to see if possible .  Adjustments in her anti-depressant medication could be made.   Over the past year, she admits to feeling fairly well.  She denies any chest pain or palpitations.  Her anxiety level is significantly reduced.  She apparently was given a trial of an oral appliance recommended by Dr. Jannifer Franklin but she stopped this because she did not tolerate it.  She now is on Viibyrd , an SSRI, for depression, which has been beneficial.  However, she admits to significant daytime fatigue and excessive daytime sleepiness.  She denies shortness of breath.  She also is on Sinemet CR 25/100 at bedtime in addition to Neurontin 800 mg 3 times a day for her neuropathy and PLMS.    Past Medical History  Diagnosis Date  .  Smoker   . History of ETOH abuse   . Elevated LFTs   . Fibromyalgia   . Bulging disc     X 2  . Arthritis   . Hyperlipidemia   . Osteoarthritis     lower back  . Obese   . Periodic limb movement disorder 02/25/2014  . Anemia   . Allergy   . Anxiety   . Depression   . GERD (gastroesophageal reflux disease)   . Substance abuse     Past Surgical History  Procedure Laterality Date  . Intrauterine  device insertion  08/09/2008    PARAGUARD  . Neck surgery    . Kidney surgery  1995    abcess  . Radial tunnel Bilateral 2005  . Vaginal hysterectomy Left 06/06/2013    Procedure: Vaginal Hysterectomy with Patiial Right Salpingectomy;  Surgeon: Terrance Mass, MD;  Location: [facility];  Service: Gynecology;  Laterality: Left;  . Laparoscopy N/A 06/06/2013    Procedure: LAPAROSCOPY DIAGNOSTIC;  Surgeon: Terrance Mass, MD;  Location: [facility];  Service: Gynecology;  Laterality: N/A;  . Laparotomy  06/06/2013    Procedure: EXPLORATORY LAPAROTOMY;  Surgeon: Terrance Mass, MD;  Location: [facility];  Service: Gynecology;;  . Cystoscopy  06/06/2013    Procedure: CYSTOSCOPY;  Surgeon: Terrance Mass, MD;  Location: [facility];  Service: Gynecology;;    Allergies  Allergen Reactions  . Benadryl [Diphenhydramine Hcl] Hives  . Requip [Ropinirole Hcl]     Increased headache    Current Outpatient Prescriptions  Medication Sig Dispense Refill  . amoxicillin-clavulanate (AUGMENTIN) 875-125 MG per tablet     . B Complex Vitamins (B COMPLEX PO) Take 1 tablet by mouth daily.    . Calcium Carbonate-Vitamin D (CALCIUM 600/VITAMIN D) 600-400 MG-UNIT per tablet Take 1 tablet by mouth 2 (two) times daily.     . Carbidopa-Levodopa ER (SINEMET CR) 25-100 MG tablet controlled release TAKE 1 TABLET BY MOUTH AT BEDTIME 90 tablet 1  . cetirizine (ZYRTEC) 10 MG tablet Take 10 mg by mouth daily.      . Coenzyme Q10 (COQ10) 100 MG CAPS Take 100 mg by mouth daily.    [name] esomeprazole (NEXIUM) 40 MG capsule Take 40 mg by mouth daily before breakfast.    . fluticasone (FLONASE) 50 MCG/ACT nasal spray     . gabapentin (NEURONTIN) 800 MG tablet Take 800 mg by mouth 3 (three) times daily.     [name] HYDROcodone-homatropine (HYCODAN) 5-1.5 MG/5ML syrup     . hydrOXYzine (ATARAX/VISTARIL) 25 MG tablet Take 25 mg by mouth daily as needed for anxiety.     [name] LORazepam (ATIVAN) 1 MG tablet Take 1 mg by mouth at bedtime.     . magnesium  gluconate (MAGONATE) 500 MG tablet Take 500 mg by mouth 3 (three) times daily.    . Multiple Vitamins-Minerals (MULTIVITAMIN PO) Take by mouth daily.    . OMEGA 3-6-9 FATTY ACIDS PO Take 500 mg by mouth daily.    [name] PROAIR HFA 108 (90 BASE) MCG/ACT inhaler Inhale 1 puff into the lungs as needed.    . rizatriptan (MAXALT) 10 MG tablet Take 10 mg by mouth as needed for migraine. May repeat in 2 hours if needed    . thiamine (VITAMIN B-1) 50 MG tablet Take 50 mg by mouth daily.    [name] VIIBRYD 40 MG TABS Take 40 mg by mouth daily.  0  . vitamin C (ASCORBIC ACID) 500 MG tablet Take 500  mg by mouth daily.     No current facility-administered medications for this visit.    Social history is notable in that she is separated. She has one child. 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 She does not routinely exercise.  Family History  Problem Relation Age of Onset  . Breast cancer Mother   . Breast cancer Maternal Aunt   . Breast cancer Paternal Grandmother     COLON  . Cancer Paternal Grandfather     THROAT .[name]   ROS General: Negative; No fevers, chills, or night sweats;  HEENT: Negative; No changes in vision or hearing, sinus congestion, difficulty swallowing Pulmonary: Negative; No cough, wheezing, shortness of breath, hemoptysis Cardiovascular: Negative; No chest pain, presyncope, syncope, palpitations GI: Negative; No nausea, vomiting, diarrhea, or abdominal pain GU: Negative; No dysuria, hematuria, or difficulty voiding Musculoskeletal: Negative; no myalgias, joint pain, or weakness Hematologic/Oncology: Negative; no easy bruising, bleeding Endocrine: Negative; no heat/cold intolerance; no diabetes Neuro: Negative; no changes in balance, headaches Skin: Negative; No rashes or skin lesions Psychiatric: Positive for anxiety/depression.  Anxiety, improved. Sleep: Positive for daytime sleepiness, hypersomnolence, no bruxism, hypnogognic hallucinations, no  cataplexy Other comprehensive 14 point system review is negative.   PE BP 110/78 mmHg  Pulse 67  Ht 5' 6" (1.676 m)  Wt 238 lb 1.6 oz (108.001 kg)  BMI 38.45 kg/m2  LMP 04/25/2013 General: Alert, oriented, no distress. Moderate obesity Skin: normal turgor, no rashes HEENT: Normocephalic, atraumatic. Pupils round and reactive; sclera anicteric; Fundi without hemorrhages or exudates Nose without nasal septal hypertrophy Mouth/Parynx benign; Mallinpatti scale 3 Neck: No JVD, no carotid bruits; thick neck Lungs: clear to ausculatation and percussion; no wheezing or rales Chest wall: No tenderness to palpate Heart: RRR, s1 s2 normal 5-4/4 systolic murmur along the left lower sternal border and slightly more prominent at the apex suggesting mitral regurgitation Abdomen: soft, nontender; no hepatosplenomehaly, BS+; abdominal aorta nontender and not dilated by palpation. Back: No CVA tenderness  Pulses 2+ Extremities: no clubbing cyanosis or edema, Homan's sign negative  Neurologic: grossly nonfocal Psychologic: Normal mood and affect  ECG (independently read by me): Normal sinus rhythm at 67 bpm.  Nonspecific ST-T changes anterolaterally.  QTc interval 433 ms.  ECG independently read by me reveals normal sinus rhythm at 75 beats per minute. QTc  453 ms. No significant ST changes.   Prior ECG of 10/16/2013: Sinus rhythm 86 beats per minute. Nonspecific ST changes.  LABS:  BMET  BMP Latest Ref Rng 08/28/2014 10/17/2013 10/12/2013  Glucose 70 - 99 mg/dL 99 105(H) 67(L)  BUN 6 - 23 mg/dL _0 Creatinine 0.50 - 1.10 mg/dL 0.58 0.61 0.68  Sodium 135 - 145 mEq/L 139 137 137  Potassium 3.5 - 5.3 mEq/L 4.4 4.5 4.2  Chloride 96 - 112 mEq/L 103 104 102  CO2 19 - 32 mEq/L _1 Calcium 8.4 - 10.5 mg/dL 8.9 9.0 8.8    Hepatic Function Panel   Hepatic Function Latest Ref Rng 08/28/2014 10/17/2013 10/12/2013  Total Protein 6.0 - 8.3 g/dL 6.4 7.0 6.7  Albumin 3.5 - 5.2 g/dL 3.9 4.1  4.0  AST 0 - 37 U/L _2 ALT 0 - 35 U/L _3 Alk Phosphatase 39 - 117 U/L 80 65 68  Total Bilirubin 0.2 - 1.2 mg/dL 0.3 0.3 0.2(L)     CBC  CBC Latest Ref Rng 06/27/2014 02/25/2014 10/12/2013  WBC 3.4 -  10.8 x10E3/uL 9.4 12.0(H) 12.8(H)  Hemoglobin 11.1 - 15.9 g/dL 13.2 12.1 11.4(L)  Hematocrit 34.0 - 46.6 % 40.3 36.9 35.2(L)  Platelets 150 - 379 x10E3/uL 305 281 343     BNP No results found for: PROBNP  Lipid Panel     Component Value Date/Time   CHOL 157 08/28/2014 0957   TRIG 407* 08/28/2014 0957   HDL 33* 08/28/2014 0957   CHOLHDL 4.8 08/28/2014 0957   VLDL NOT CALC 08/28/2014 0957   LDLCALC NOT CALC 08/28/2014 0957     RADIOLOGY: No results found.   ASSESSMENT AND PLAN:  Ms. Charese Abundis is a 43 year old female has a history of depression for which she's been on antidepressant medication, as well as a history of migraine headache. She does have a at least a 1-2/6 cardiac murmur suggestive of possible mitral regurgitation be most prominent at the apex. Her echo Doppler  revealed normal systolic and diastolic function without evidence for hypertrophic cardiomyopathy. She did not have significant valvular pathology. She had normal LV size and function.  Her palpitations have essentially resolved with her reduction in anxiety.  Overall, she feels well.  There is no chest pain or shortness of breath.  She remains active and also volunteers with the [employer] and takes care of her 33 year old son going to many sporting events.  She has continued difficulty with daytime fatigue. She may have some slight increased upper airway resistance  contributing to her snoring and slightly elevated RDI, but she did not have significant obstructive sleep apnea. She did have significant frequent periodic leg movement disorder of sleep which may be contributing to some of her disruptive sleep pattern and did not tolerate a trial of either requip or Mirapex for restless legs.  She seems  to be doing better with reference to gabapentin.  I suspect  a big contributor to her continued fatigability is complete absence of REM sleep which has been associated with antidepressant therapy.  Again, reviewed the different antidepressants as noted above and if she has not had a trial of Imuran or welder Pablo Ledger this potentially may be beneficial in increasing REM sleep.  She again will be seeing her psychiatric physician who monitors and adjust her medications.  She has not had recent lab work in over 6 months.  She does have hyperlipidemia, particularly hypertriglyceridemia, which was significantly more increased last year compared to 2014 when her triglycerides were 201.  She may benefit from a trial of fenofibrate 145 mg daily.  In the past, we had discussed fish oil, but are not certain she is taking this.  Her blood pressure today is controlled.  She does have moderate obesity with a BMI of 38.45, and weight reduction is recommended.  I will see her in one year for cardiology reevaluation.   Troy Sine, MD, [facility] 01/24/2015 3:58 PM

## 2015-02-04 ENCOUNTER — Telehealth: Payer: Self-pay | Admitting: Neurology

## 2015-02-04 NOTE — Telephone Encounter (Signed)
Left message to call back and schedule appt with Dr. Rexene Alberts

## 2015-02-04 NOTE — Telephone Encounter (Signed)
Talked to patient and worked her in on Thursday

## 2015-02-04 NOTE — Telephone Encounter (Signed)
I can see her back for a brief follow up.

## 2015-02-04 NOTE — Telephone Encounter (Signed)
Patient stated Rx Carbidopa-Levodopa ER (SINEMET CR) 25-100 MG tablet controlled release isn't beneficial.  She still experiencing lack of sleep and causing her to miss days from work, which is becoming an issue.  Patient questioning if she needs to address her sleep apnea.  Please call and advise.

## 2015-02-04 NOTE — Telephone Encounter (Signed)
Do you want to see patient? If so do you want a 71mn or 364m slot?

## 2015-02-04 NOTE — Telephone Encounter (Signed)
I called patient. The patient indicates that she is not getting any benefit from the Sinemet at night. She is still jerking and twitching, she wakes up with the skin on her legs and face somewhat roll from twitching and jerking all night. She is fatigued, she indicates that the oral appliance causes headaches, she cannot wear the device for her apnea issue. She feels that she is in danger of losing her job. I'll try to get an appointment set up with Dr. Rexene Alberts.

## 2015-02-06 ENCOUNTER — Encounter: Payer: Self-pay | Admitting: Neurology

## 2015-02-06 ENCOUNTER — Ambulatory Visit (INDEPENDENT_AMBULATORY_CARE_PROVIDER_SITE_OTHER): Payer: BC Managed Care – PPO | Admitting: Neurology

## 2015-02-06 VITALS — BP 134/81 | HR 66 | Temp 97.8°F | Resp 16 | Ht 66.0 in | Wt 233.0 lb

## 2015-02-06 DIAGNOSIS — G2581 Restless legs syndrome: Secondary | ICD-10-CM

## 2015-02-06 DIAGNOSIS — F39 Unspecified mood [affective] disorder: Secondary | ICD-10-CM

## 2015-02-06 DIAGNOSIS — G479 Sleep disorder, unspecified: Secondary | ICD-10-CM | POA: Diagnosis not present

## 2015-02-06 NOTE — Progress Notes (Signed)
Subjective:    Patient ID: Gloria [name] is a 44 y.o. female.  HPI     Interim history:  Gloria [name] is a 44 year old right-handed woman with an underlying medical history of obesity, fibromyalgia, degenerative spine disease, heart murmur, history of alcohol abuse in the past, ex-smoker, reflux disease, restless leg syndrome and periodic leg movements of sleep who presents for follow-up consultation as requested by Dr. Jannifer Franklin. The patient is unaccompanied today. I first met her on 08/28/2014 after her sleep study. We talked about her sleep study results at the time. She did have PLMS during the sleep study with minimal arousals. Her total AHI was 5 per hour, rising to 19.2 per hour during REM sleep. She wanted to pursue treatment of her overall borderline sleep apnea with a dental appliance. She could not tolerate this. I suggested that she try Neupro patch. In the past she was not able to tolerate Requip or Mirapex for RLS.   Today, 02/06/2015: she reports not doing well with her mood disorder. She sees a Teacher, music. She feels exhausted all the time. She did not try Neupro, as she feels, her RLS is under control. She says that she has been tried since October 2015 on 5 different antidepressants. Nothing seems to work well for her. She does not seem to get enough restful sleep. We discussed again her sleep study results from last year. I explained to her that she had a good sleep efficiency at 90% and normal REM latency as well as nearly normal REM percentage at 19%. While she did have periodic leg movements in the moderate degree, she had very minimal arousals from PLMS. Of note, she was on Sinemet CR at the time. She was not on an antidepressant at the time. She is no longer on Brintellex. She is back on Viibryd.   Previously:   She had baseline sleep study on 08/07/2014: Sleep efficiency was normal at 90.2% with a reduced sleep latency of 0 minutes and wake after sleep onset of 32.5 minutes with  mild sleep fragmentation noted. She had a normal arousal index of 6.8 per hour because of respiratory events primarily. She had a normal percentage of stage I and stage II sleep, an increased percentage of deep sleep and a near normal percentage of dream sleep at 19.4% with a normal REM latency. She had moderate periodic leg movements of sleep at 36.2 per hour resulting in only 2 arousals per hour. She had mild to moderate snoring. She slept only on the right side. She had a total of 32 obstructive hypopneas. Overall AHI was 5 per hour, rising to 19.2 per hour during REM sleep. Baseline oxygen saturation was 93%, nadir was 87%. Time below 90% saturation was 1 minutes and 49 seconds. She reports residual drowsiness. She goes to bed around 11 PM and falls asleep quickly. Wake time is 6:30 for work. She does not wake up rested. She has recurrent headaches. She has a lot of stress. She is going through divorce. She quit smoking 2 years ago. She quit drinking alcohol 6 years ago. She goes to meetings regularly. She has sleep study previously in December of last year which per her report did not show any REM sleep and no sleep disordered breathing and severe leg twitching. Her RLS has improved. She has residual fatigue, chronic pain, and lack of energy. She has not seen anybody for her fibromyalgia in over 2 years.  For her RLS she has been on Sinemet CR, 25/100 mg,  1 at bedtime. She has tried Requip, Mirapex, both of which cause her migraines to get worse.   Her Past Medical History Is Significant For: Past Medical History  Diagnosis Date  . Smoker   . History of ETOH abuse   . Elevated LFTs   . Fibromyalgia   . Bulging disc     X 2  . Arthritis   . Hyperlipidemia   . Osteoarthritis     lower back  . Obese   . Periodic limb movement disorder 02/25/2014  . Anemia   . Allergy   . Anxiety   . Depression   . GERD (gastroesophageal reflux disease)   . Substance abuse     Her Past Surgical History Is  Significant For: Past Surgical History  Procedure Laterality Date  . Intrauterine device insertion  08/09/2008    PARAGUARD  . Neck surgery    . Kidney surgery  1995    abcess  . Radial tunnel Bilateral 2005  . Vaginal hysterectomy Left 06/06/2013    Procedure: Vaginal Hysterectomy with Patiial Right Salpingectomy;  Surgeon: Terrance Mass, MD;  Location: [facility];  Service: Gynecology;  Laterality: Left;  . Laparoscopy N/A 06/06/2013    Procedure: LAPAROSCOPY DIAGNOSTIC;  Surgeon: Terrance Mass, MD;  Location: [facility];  Service: Gynecology;  Laterality: N/A;  . Laparotomy  06/06/2013    Procedure: EXPLORATORY LAPAROTOMY;  Surgeon: Terrance Mass, MD;  Location: [facility];  Service: Gynecology;;  . Cystoscopy  06/06/2013    Procedure: CYSTOSCOPY;  Surgeon: Terrance Mass, MD;  Location: [facility];  Service: Gynecology;;    Her Family History Is Significant For: Family History  Problem Relation Age of Onset  . Breast cancer Mother   . Breast cancer Maternal Aunt   . Breast cancer Paternal Grandmother     COLON  . Cancer Paternal Grandfather     THROAT .[name]    Her Social History Is Significant For: History   Social History  . Marital Status: Legally Separated    Spouse Name: N/A  . Number of Children: 1  . Years of Education: 13   Occupational History  .  [place] History Main Topics  . Smoking status: Former Smoker -- 1.00 packs/day    Quit date: 01/26/2012  . Smokeless tobacco: Never Used  . Alcohol Use: No     Comment: History of alcohol abuse  . Drug Use: No  . Sexual Activity: Yes   Other Topics Concern  . None   Social History Narrative   Patient lives at home and works full time.   Education some college.   Right handed.   Caffeine one can of soda daily.    Her Allergies Are:  Allergies  Allergen Reactions  . Benadryl [Diphenhydramine Hcl] Hives  . Requip [Ropinirole Hcl]     Increased headache  :   Her Current Medications  Are:  Outpatient Encounter Prescriptions as of 02/06/2015  Medication Sig  . amoxicillin-clavulanate (AUGMENTIN) 875-125 MG per tablet   . B Complex Vitamins (B COMPLEX PO) Take 1 tablet by mouth daily.  . Calcium Carbonate-Vitamin D (CALCIUM 600/VITAMIN D) 600-400 MG-UNIT per tablet Take 1 tablet by mouth 2 (two) times daily.   . Carbidopa-Levodopa ER (SINEMET CR) 25-100 MG tablet controlled release TAKE 1 TABLET BY MOUTH AT BEDTIME  . cetirizine (ZYRTEC) 10 MG tablet Take 10 mg by mouth daily.    . Coenzyme Q10 (COQ10) 100 MG CAPS Take  100 mg by mouth daily.  [name] esomeprazole (NEXIUM) 40 MG capsule Take 40 mg by mouth daily before breakfast.  . gabapentin (NEURONTIN) 800 MG tablet Take 800 mg by mouth 3 (three) times daily.   . hydrOXYzine (ATARAX/VISTARIL) 25 MG tablet Take 25 mg by mouth daily as needed for anxiety.   [name] LORazepam (ATIVAN) 1 MG tablet Take 1 mg by mouth at bedtime.   . magnesium gluconate (MAGONATE) 500 MG tablet Take 500 mg by mouth 3 (three) times daily.  . Multiple Vitamins-Minerals (MULTIVITAMIN PO) Take by mouth daily.  . OMEGA 3-6-9 FATTY ACIDS PO Take 500 mg by mouth daily.  [name] PROAIR HFA 108 (90 BASE) MCG/ACT inhaler Inhale 1 puff into the lungs as needed.  . rizatriptan (MAXALT) 10 MG tablet Take 10 mg by mouth as needed for migraine. May repeat in 2 hours if needed  . thiamine (VITAMIN B-1) 50 MG tablet Take 50 mg by mouth daily.  [name] VIIBRYD 40 MG TABS Take 40 mg by mouth daily.  . vitamin C (ASCORBIC ACID) 500 MG tablet Take 500 mg by mouth daily.  . [DISCONTINUED] fluticasone (FLONASE) 50 MCG/ACT nasal spray   . [DISCONTINUED] HYDROcodone-homatropine (HYCODAN) 5-1.5 MG/5ML syrup   :  Review of Systems:  Out of a complete 14 point review of systems, all are reviewed and negative with the exception of these symptoms as listed below:   Review of Systems  Neurological:       Having trouble falling and staying asleep    Objective:  Neurologic Exam  Physical  Exam Physical Examination:   Filed Vitals:   02/06/15 1310  BP: 134/81  Pulse: 66  Temp: 97.8 F (36.6 C)  Resp: 16    General Examination: The patient is a very pleasant 44 y.o. female in no acute distress. She appears well-developed and well-nourished and very well groomed. She is tearful today.  I did not do a full exam as I merely talked to her today about her prior sleep test results, and her restless leg syndrome as well as periodic leg movements of sleep.   Assessment and Plan:   In summary, Zaila Crew is a 44 year old female with an underlying medical history of obesity, fibromyalgia, degenerative spine disease, heart murmur, history of alcohol abuse in the past, ex-smoker, reflux disease, restless leg syndrome and periodic leg movements of sleep who presents for follow-up consultation as requested by Dr. Jannifer Franklin.  She has been struggling with her mood disorder. She is working closely with a psychiatrist. Unfortunately she has not been able to find an antidepressant that has helped her. She continues to have daytime somnolence. She had only borderline obstructive sleep apnea and since then has actually been able to achieve a 20 pound weight loss since January 2016. I congratulated her on her weight loss. Her restless leg symptoms are actually under reasonable control with her current medication. She tried an oral appliance for her mild or borderline obstructive sleep apnea and could not tolerate it as it made her headaches worse. I explained to her that because of her weight loss she may not need to be treated for obstructive sleep apnea at this time. I explained to her sleep test results again to her and reassured her that she had actually fairly good sleep that night. I'm not sure how I can be of further assistance to her and suggested that she follow-up closely with her psychiatrist. She has an appointment with Dr. Jannifer Franklin pending for June and  she may be able to see him sooner than  this. At this juncture, I'm not sure that a primary or organic underlying sleep disorder is the root cause of her exhaustion as opposed to untreated or suboptimally treated mood disorder. Patients with significant depression or other types of mood disorders can experience physical exhaustion, fatigue and frank sleepiness. I think my role in her care is therefore quite limited at this time.

## 2015-02-18 ENCOUNTER — Other Ambulatory Visit: Payer: Self-pay

## 2015-02-18 DIAGNOSIS — Z1231 Encounter for screening mammogram for malignant neoplasm of breast: Secondary | ICD-10-CM

## 2015-02-25 ENCOUNTER — Ambulatory Visit (INDEPENDENT_AMBULATORY_CARE_PROVIDER_SITE_OTHER): Payer: BC Managed Care – PPO | Admitting: Neurology

## 2015-02-25 ENCOUNTER — Encounter: Payer: Self-pay | Admitting: Neurology

## 2015-02-25 VITALS — BP 128/81 | HR 89 | Ht 66.0 in | Wt 235.2 lb

## 2015-02-25 DIAGNOSIS — M549 Dorsalgia, unspecified: Secondary | ICD-10-CM

## 2015-02-25 DIAGNOSIS — G4761 Periodic limb movement disorder: Secondary | ICD-10-CM

## 2015-02-25 DIAGNOSIS — Z8669 Personal history of other diseases of the nervous system and sense organs: Secondary | ICD-10-CM

## 2015-02-25 DIAGNOSIS — M797 Fibromyalgia: Secondary | ICD-10-CM

## 2015-02-25 DIAGNOSIS — G8929 Other chronic pain: Secondary | ICD-10-CM

## 2015-02-25 DIAGNOSIS — G4733 Obstructive sleep apnea (adult) (pediatric): Secondary | ICD-10-CM

## 2015-02-25 HISTORY — DX: Obstructive sleep apnea (adult) (pediatric): G47.33

## 2015-02-25 HISTORY — DX: Other chronic pain: G89.29

## 2015-02-25 MED ORDER — MELOXICAM 15 MG PO TABS: 15.0000 mg | ORAL_TABLET | Freq: Every day | ORAL | Status: DC

## 2015-02-25 MED ORDER — CARBIDOPA-LEVODOPA ER 25-100 MG PO TBCR: 1.0000 | EXTENDED_RELEASE_TABLET | Freq: Every day | ORAL | Status: DC

## 2015-02-25 NOTE — Progress Notes (Signed)
Reason for visit: Fibromyalgia  Gloria [name] is an 44 y.o. female  History of present illness:  Gloria [name] is a 44 year old right-handed white female with a history of obesity, periodic limb movements, and fibromyalgia. The patient returns to the office today for an evaluation. The patient indicates that she has done relatively well with regard to the periodic limb movements recently. The patient is still having significant issues with depression, and she is now on Abilify at nighttime. The patient takes melatonin at night, and this seems to help her sleep as well. The patient has developed some issues with back pain, and pain into the right hip and into the pretibial area on the right leg in the last 8 months. This is oftentimes significant at nighttime when she tries to lie down. The patient had an oral device for her mild sleep apnea, but this increased her headaches, and she had to stop using it. The patient has been seen by Dr. Rexene Alberts for her sleep apnea, but CPAP was not recommended. The patient has been trying to lose weight, she has lost about 21 pounds with [employer]. She also describes some numbness in the anterolateral aspect of her left thigh at nighttime when she goes to bed. The patient returns to the office today for an evaluation.  Past Medical History  Diagnosis Date  . Smoker   . History of ETOH abuse   . Elevated LFTs   . Fibromyalgia   . Bulging disc     X 2  . Arthritis   . Hyperlipidemia   . Osteoarthritis     lower back  . Obese   . Periodic limb movement disorder 02/25/2014  . Anemia   . Allergy   . Anxiety   . Depression   . GERD (gastroesophageal reflux disease)   . Substance abuse   . Obstructive sleep apnea 02/25/2015  . Chronic back pain 02/25/2015    Past Surgical History  Procedure Laterality Date  . Intrauterine device insertion  08/09/2008    PARAGUARD  . Neck surgery    . Kidney surgery  1995    abcess  . Radial tunnel Bilateral 2005    . Vaginal hysterectomy Left 06/06/2013    Procedure: Vaginal Hysterectomy with Patiial Right Salpingectomy;  Surgeon: Terrance Mass, MD;  Location: [facility];  Service: Gynecology;  Laterality: Left;  . Laparoscopy N/A 06/06/2013    Procedure: LAPAROSCOPY DIAGNOSTIC;  Surgeon: Terrance Mass, MD;  Location: [facility];  Service: Gynecology;  Laterality: N/A;  . Laparotomy  06/06/2013    Procedure: EXPLORATORY LAPAROTOMY;  Surgeon: Terrance Mass, MD;  Location: [facility];  Service: Gynecology;;  . Cystoscopy  06/06/2013    Procedure: CYSTOSCOPY;  Surgeon: Terrance Mass, MD;  Location: [facility];  Service: Gynecology;;    Family History  Problem Relation Age of Onset  . Breast cancer Mother   . Breast cancer Maternal Aunt   . Breast cancer Paternal Grandmother     COLON  . Cancer Paternal Grandfather     THROAT .[name]    Social history:  reports that she quit smoking about 3 years ago. She has never used smokeless tobacco. She reports that she does not drink alcohol or use illicit drugs.    Allergies  Allergen Reactions  . Benadryl [Diphenhydramine Hcl] Hives  . Requip [Ropinirole Hcl]     Increased headache    Medications:  Prior to Admission medications   Medication Sig Start Date  End Date Taking? Authorizing Provider  ARIPiprazole (ABILIFY) 5 MG tablet Take 5 mg by mouth daily.   Yes Historical Provider, MD  B Complex Vitamins (B COMPLEX PO) Take 1 tablet by mouth daily.   Yes Historical Provider, MD  Calcium Carbonate-Vitamin D (CALCIUM 600/VITAMIN D) 600-400 MG-UNIT per tablet Take 1 tablet by mouth 2 (two) times daily.    Yes Historical Provider, MD  Carbidopa-Levodopa ER (SINEMET CR) 25-100 MG tablet controlled release TAKE 1 TABLET BY MOUTH AT BEDTIME 08/22/14  Yes Kathrynn Ducking, MD  cetirizine (ZYRTEC) 10 MG tablet Take 10 mg by mouth daily.     Yes Historical Provider, MD  Coenzyme Q10 (COQ10) 100 MG CAPS Take 100 mg by mouth daily.   Yes Historical Provider, MD  esomeprazole  (NEXIUM) 40 MG capsule Take 40 mg by mouth daily before breakfast.   Yes Historical Provider, MD  gabapentin (NEURONTIN) 800 MG tablet Take 800 mg by mouth 3 (three) times daily.    Yes Historical Provider, MD  hydrOXYzine (ATARAX/VISTARIL) 25 MG tablet Take 25 mg by mouth daily as needed for anxiety.    Yes Historical Provider, MD  LORazepam (ATIVAN) 1 MG tablet Take 1 mg by mouth at bedtime.    Yes Historical Provider, MD  magnesium gluconate (MAGONATE) 500 MG tablet Take 500 mg by mouth 3 (three) times daily.   Yes Historical Provider, MD  Melatonin 3 MG TABS Take 3 mg by mouth at bedtime.   Yes Historical Provider, MD  Multiple Vitamins-Minerals (MULTIVITAMIN PO) Take by mouth daily.   Yes Historical Provider, MD  OMEGA 3-6-9 FATTY ACIDS PO Take 500 mg by mouth daily.   Yes Historical Provider, MD  PROAIR HFA 108 (90 BASE) MCG/ACT inhaler Inhale 1 puff into the lungs as needed. 08/23/13  Yes Historical Provider, MD  rizatriptan (MAXALT) 10 MG tablet Take 10 mg by mouth as needed for migraine. May repeat in 2 hours if needed   Yes Historical Provider, MD  thiamine (VITAMIN B-1) 50 MG tablet Take 50 mg by mouth daily.   Yes Historical Provider, MD  vitamin C (ASCORBIC ACID) 500 MG tablet Take 500 mg by mouth daily.   Yes Historical Provider, MD    ROS:  Out of a complete 14 system review of symptoms, the patient complains only of the following symptoms, and all other reviewed systems are negative.  Fatigue Restless legs, frequent waking, daytime sleepiness Joint pain, walking difficulty, neck pain, neck stiffness Depression, anxiety  Blood pressure 128/81, pulse 89, height 5' 6"  (1.676 m), weight 235 lb 3.2 oz (106.686 kg), last menstrual period 04/25/2013.  Physical Exam  General: The patient is alert and cooperative at the time of the examination. The patient is moderately to markedly obese.  Neuromuscular: Internal/external rotation of the right hip elicits no significant  discomfort.  Skin: No significant peripheral edema is noted.   Neurologic Exam  Mental status: The patient is alert and oriented x 3 at the time of the examination. The patient has apparent normal recent and remote memory, with an apparently normal attention span and concentration ability.   Cranial nerves: Facial symmetry is present. Speech is normal, no aphasia or dysarthria is noted. Extraocular movements are full. Visual fields are full.  Motor: The patient has good strength in all 4 extremities.  Sensory examination: Soft touch sensation is symmetric on the face, arms, and legs.  Coordination: The patient has good finger-nose-finger and heel-to-shin bilaterally.  Gait and station: The patient has  a normal gait. Tandem gait is normal. Romberg is negative. No drift is seen.  Reflexes: Deep tendon reflexes are symmetric.   Assessment/Plan:  1. Fibromyalgia  2. Migraine headache  3. Depression, anxiety  4. Periodic limb movements, restless leg syndrome  5. Chronic low back pain, right leg pain  6. Left meralgia paresthetica  The patient has ongoing pain issues and a history of anxiety and depression. The patient is having new issues with her low back and right leg pain. The patient will be placed on Mobic over the next 8 weeks, if this does not improve, she is to contact our office. We may consider EMG and nerve conduction study evaluation. The patient will continue the Sinemet at night for the restless leg syndrome. A prescription was given for this and for the Mobic. The patient will follow-up in 6 months, or sooner if needed.  Jill Alexanders MD 02/25/2015 9:40 PM  [facility] [address] [place], [place] [address]  Phone [phone] Fax [phone]

## 2015-02-25 NOTE — Patient Instructions (Signed)
Fibromyalgia Fibromyalgia is a disorder that is often misunderstood. It is associated with muscular pains and tenderness that comes and goes. It is often associated with fatigue and sleep disturbances. Though it tends to be long-lasting, fibromyalgia is not life-threatening. CAUSES  The exact cause of fibromyalgia is unknown. People with certain gene types are predisposed to developing fibromyalgia and other conditions. Certain factors can play a role as triggers, such as:  Spine disorders.  Arthritis.  Severe injury (trauma) and other physical stressors.  Emotional stressors. SYMPTOMS   The main symptom is pain and stiffness in the muscles and joints, which can vary over time.  Sleep and fatigue problems. Other related symptoms may include:  Bowel and bladder problems.  Headaches.  Visual problems.  Problems with odors and noises.  Depression or mood changes.  Painful periods (dysmenorrhea).  Dryness of the skin or eyes. DIAGNOSIS  There are no specific tests for diagnosing fibromyalgia. Patients can be diagnosed accurately from the specific symptoms they have. The diagnosis is made by determining that nothing else is causing the problems. TREATMENT  There is no cure. Management includes medicines and an active, healthy lifestyle. The goal is to enhance physical fitness, decrease pain, and improve sleep. HOME CARE INSTRUCTIONS   Only take over-the-counter or prescription medicines as directed by your caregiver. Sleeping pills, tranquilizers, and pain medicines may make your problems worse.  Low-impact aerobic exercise is very important and advised for treatment. At first, it may seem to make pain worse. Gradually increasing your tolerance will overcome this feeling.  Learning relaxation techniques and how to control stress will help you. Biofeedback, visual imagery, hypnosis, muscle relaxation, yoga, and meditation are all options.  Anti-inflammatory medicines and  physical therapy may provide short-term help.  Acupuncture or massage treatments may help.  Take muscle relaxant medicines as suggested by your caregiver.  Avoid stressful situations.  Plan a healthy lifestyle. This includes your diet, sleep, rest, exercise, and friends.  Find and practice a hobby you enjoy.  Join a fibromyalgia support group for interaction, ideas, and sharing advice. This may be helpful. SEEK MEDICAL CARE IF:  You are not having good results or improvement from your treatment. FOR MORE INFORMATION  National Fibromyalgia Association: www.fmaware.[place]: www.arthritis.org Document Released: 11/08/2005 Document Revised: 01/31/2012 Document Reviewed: 02/18/2010 [facility] Patient Information 2015 [place], [place]. This information is not intended to replace advice given to you by your health care provider. Make sure you discuss any questions you have with your health care provider.

## 2015-03-12 ENCOUNTER — Ambulatory Visit
Admission: RE | Admit: 2015-03-12 | Discharge: 2015-03-12 | Disposition: A | Discharge status: Home / Self Care | Payer: BC Managed Care – PPO | Source: Ambulatory Visit

## 2015-03-12 DIAGNOSIS — Z1231 Encounter for screening mammogram for malignant neoplasm of breast: Secondary | ICD-10-CM

## 2015-04-10 ENCOUNTER — Ambulatory Visit (INDEPENDENT_AMBULATORY_CARE_PROVIDER_SITE_OTHER): Payer: BC Managed Care – PPO | Admitting: Physician Assistant

## 2015-04-10 VITALS — BP 120/80 | HR 92 | Temp 97.7°F | Ht 64.75 in | Wt 237.0 lb

## 2015-04-10 DIAGNOSIS — J029 Acute pharyngitis, unspecified: Secondary | ICD-10-CM | POA: Diagnosis not present

## 2015-04-10 DIAGNOSIS — J069 Acute upper respiratory infection, unspecified: Secondary | ICD-10-CM

## 2015-04-10 LAB — POCT RAPID STREP A (OFFICE): RAPID STREP A SCREEN: NEGATIVE

## 2015-04-10 MED ORDER — IBUPROFEN 600 MG PO TABS: 600.0000 mg | ORAL_TABLET | Freq: Three times a day (TID) | ORAL | Status: DC | PRN

## 2015-04-10 NOTE — Progress Notes (Signed)
04/10/2015 at 9:28 PM  Gloria [name] / DOB: 21-Aug-1971 / MRN: [id number]  The patient has Fibromyalgia; Depression with anxiety; GERD (gastroesophageal reflux disease); H/O ETOH abuse; Alcoholic liver disease; Ex-cigarette smoker; Fluid retention; Weight gain; Anxiety; Cardiac murmur; Snoring; Hx of migraine headaches; Restless legs syndrome; Periodic limb movement disorder; Obstructive sleep apnea; and Chronic back pain on her problem list.  SUBJECTIVE  Chief complaint: Sore Throat; Headache; and Generalized Body Aches  Gloria [name] is a 44 y.o. female complaining of sore throat that started 1 days ago.  Associated symptoms include headache and myalgia today, and she denies runny nose, congestion, fever, cough, difficulty breathing and jaw pain.The patient symptoms show no change. Treatments tried thus far include ibuprofen with good  relief. She reports sick contacts.   She  has a past medical history of Smoker; History of ETOH abuse; Elevated LFTs; Fibromyalgia; Bulging disc; Arthritis; Hyperlipidemia; Osteoarthritis; Obese; Periodic limb movement disorder (02/25/2014); Anemia; Allergy; Anxiety; Depression; GERD (gastroesophageal reflux disease); Substance abuse; Obstructive sleep apnea (02/25/2015); and Chronic back pain (02/25/2015).    Medications reviewed and updated by myself where necessary, and exist elsewhere in the encounter.   Gloria [name] is allergic to benadryl and requip. She  reports that she quit smoking about 3 years ago. She has never used smokeless tobacco. She reports that she does not drink alcohol or use illicit drugs. She  reports that she currently engages in sexual activity. The patient  has past surgical history that includes Intrauterine device insertion (08/09/2008); Neck surgery; kidney surgery (1995); radial tunnel (Bilateral, 2005); Vaginal hysterectomy (Left, 06/06/2013); laparoscopy (N/A, 06/06/2013); laparotomy (06/06/2013); and Cystoscopy (06/06/2013).  Her family history  includes Breast cancer in her maternal aunt, mother, and paternal grandmother; Cancer in her paternal grandfather.  Review of Systems  Constitutional: Negative for fever and chills.  Eyes: Negative.   Respiratory: Negative for shortness of breath and wheezing.   Cardiovascular: Negative for chest pain.  Gastrointestinal: Negative for nausea and abdominal pain.  Skin: Negative for itching and rash.  Neurological: Negative for dizziness and headaches.    OBJECTIVE  Her  height is 5' 4.75" (1.645 m) and weight is 237 lb (107.502 kg). Her oral temperature is 97.7 F (36.5 C). Her blood pressure is 120/80 and her pulse is 92. Her oxygen saturation is 98%.  The patient's body mass index is 39.73 kg/(m^2).  Physical Exam  Constitutional: She is oriented to person, place, and time. She appears well-developed and well-nourished. No distress.  Cardiovascular: Normal rate, regular rhythm and normal heart sounds.   Respiratory: Effort normal and breath sounds normal. She has no wheezes. She has no rales.  GI: Soft. There is no tenderness.  Musculoskeletal: Normal range of motion.  Neurological: She is alert and oriented to person, place, and time.  Skin: Skin is warm and dry. She is not diaphoretic.  Psychiatric: She has a normal mood and affect. Her behavior is normal. Judgment and thought content normal.    Results for orders placed or performed in visit on 04/10/15 (from the past 24 hour(s))  POCT rapid strep A     Status: None   Collection Time: 04/10/15  9:06 PM  Result Value Ref Range   Rapid Strep A Screen Negative Negative    ASSESSMENT & PLAN  Gloria [name] was seen today for sore throat, headache and generalized body aches.  Diagnoses and all orders for this visit:  Sore throat: Culture pending.  Likely viral in nature. Patient with history of drug  abuse. Will avoid narcotic cough medication.  Patient amenable to this.  Ibuprofen 600 mg po q8 prn. She is taking PPI daily for the  treatment of GERD, and I advised that she continue this.   Orders: -     POCT rapid strep A -     Culture, Group A Strep   The patient was advised to call or come back to clinic if she does not see an improvement in symptoms, or worsens with the above plan.   Philis Fendt, MHS, PA-C [facility] 04/10/2015 9:28 PM

## 2015-04-12 LAB — CULTURE, GROUP A STREP: Organism ID, Bacteria: NORMAL

## 2015-05-02 ENCOUNTER — Ambulatory Visit: Payer: BC Managed Care – PPO | Admitting: Neurology

## 2015-05-31 ENCOUNTER — Other Ambulatory Visit: Payer: Self-pay | Admitting: Neurology

## 2015-06-20 ENCOUNTER — Other Ambulatory Visit: Payer: Self-pay | Admitting: Neurology

## 2015-08-28 ENCOUNTER — Ambulatory Visit (INDEPENDENT_AMBULATORY_CARE_PROVIDER_SITE_OTHER): Payer: BC Managed Care – PPO | Admitting: Neurology

## 2015-08-28 ENCOUNTER — Encounter: Payer: Self-pay | Admitting: Neurology

## 2015-08-28 VITALS — BP 120/79 | HR 80 | Ht 66.0 in | Wt 241.5 lb

## 2015-08-28 DIAGNOSIS — G4761 Periodic limb movement disorder: Secondary | ICD-10-CM

## 2015-08-28 DIAGNOSIS — F418 Other specified anxiety disorders: Secondary | ICD-10-CM | POA: Diagnosis not present

## 2015-08-28 MED ORDER — CARBIDOPA-LEVODOPA ER 25-100 MG PO TBCR: 1.0000 | EXTENDED_RELEASE_TABLET | Freq: Every day | ORAL | Status: DC

## 2015-08-28 NOTE — Patient Instructions (Signed)
Restless Legs Syndrome Restless legs syndrome is a condition that causes uncomfortable feelings or sensations in the legs, especially while sitting or lying down. The sensations usually cause an overwhelming urge to move the legs. The arms can also sometimes be affected. The condition can range from mild to severe. The symptoms often interfere with a person's ability to sleep. CAUSES The cause of this condition is not known. RISK FACTORS This condition is more likely to develop in:  People who are older than age 45.  Pregnant women. In general, restless legs syndrome is more common in women than in men.  People who have a family history of the condition.  People who have certain medical conditions, such as iron deficiency, kidney disease, Parkinson disease, or nerve damage.  People who take certain medicines, such as medicines for high blood pressure, nausea, colds, allergies, depression, and some heart conditions. SYMPTOMS The main symptom of this condition is uncomfortable sensations in the legs. These sensations may be:  Described as pulling, tingling, prickling, throbbing, crawling, or burning.  Worse while you are sitting or lying down.  Worse during periods of rest or inactivity.  Worse at night, often interfering with your sleep.  Accompanied by a very strong urge to move your legs.  Temporarily relieved by movement of your legs. The sensations usually affect both sides of the body. The arms can also be affected, but this is rare. People who have this condition often have tiredness during the day because of their lack of sleep at night. DIAGNOSIS This condition may be diagnosed based on your description of the symptoms. You may also have tests, including blood tests, to check for other conditions that may lead to your symptoms. In some cases, you may be asked to spend some time in a sleep lab so your sleeping can be monitored. TREATMENT Treatment for this condition is  focused on managing the symptoms. Treatment may include:  Self-help and lifestyle changes.  Medicines. HOME CARE INSTRUCTIONS  Take medicines only as directed by your health care provider.  Try these methods to get temporary relief from the uncomfortable sensations:  Massage your legs.  Walk or stretch.  Take a cold or hot bath.  Practice good sleep habits. For example, go to bed and get up at the same time every day.  Exercise regularly.  Practice ways of relaxing, such as yoga or meditation.  Avoid caffeine and alcohol.  Do not use any tobacco products, including cigarettes, chewing tobacco, or electronic cigarettes. If you need help quitting, ask your health care provider.  Keep all follow-up visits as directed by your health care provider. This is important. SEEK MEDICAL CARE IF: Your symptoms do not improve with treatment, or they get worse.   This information is not intended to replace advice given to you by your health care provider. Make sure you discuss any questions you have with your health care provider.   Document Released: 10/29/2002 Document Revised: 03/25/2015 Document Reviewed: 11/04/2014 Elsevier Interactive Patient Education [employer].

## 2015-08-28 NOTE — Progress Notes (Signed)
Reason for visit: Restless legs  Gloria [name] is an 44 y.o. female  History of present illness:  Gloria [name] is a 44 year old right-handed white female with a history of obesity, depression, anxiety, and restless leg syndrome. The patient has taken FMLA time from work as she seems to have cycles of where she becomes completely exhausted and cannot get to work. She may function fairly well for 5 or 6 days in a row, and then relapse. The patient is on Abilify, but increases in dose of the Abilify seemed to worsen her restless leg syndrome. The patient has been placed on Sinemet at night, said this seems to help some. The patient feels as if she sleeps throughout the night, but she may not feel rested the next day. The patient has unfortunately gained weight after some weight loss on a weight watcher's program. The low back pain and leg discomfort she was complaining of several months ago has resolved when she bought a new mattress. The patient returns to the office today for an evaluation.  Past Medical History  Diagnosis Date  . Smoker   . History of ETOH abuse   . Elevated LFTs   . Fibromyalgia   . Bulging disc     X 2  . Arthritis   . Hyperlipidemia   . Osteoarthritis     lower back  . Obese   . Periodic limb movement disorder 02/25/2014  . Anemia   . Allergy   . Anxiety   . Depression   . GERD (gastroesophageal reflux disease)   . Substance abuse   . Obstructive sleep apnea 02/25/2015  . Chronic back pain 02/25/2015    Past Surgical History  Procedure Laterality Date  . Intrauterine device insertion  08/09/2008    PARAGUARD  . Neck surgery    . Kidney surgery  1995    abcess  . Radial tunnel Bilateral 2005  . Vaginal hysterectomy Left 06/06/2013    Procedure: Vaginal Hysterectomy with Patiial Right Salpingectomy;  Surgeon: Terrance Mass, MD;  Location: [facility];  Service: Gynecology;  Laterality: Left;  . Laparoscopy N/A 06/06/2013    Procedure: LAPAROSCOPY DIAGNOSTIC;   Surgeon: Terrance Mass, MD;  Location: [facility];  Service: Gynecology;  Laterality: N/A;  . Laparotomy  06/06/2013    Procedure: EXPLORATORY LAPAROTOMY;  Surgeon: Terrance Mass, MD;  Location: [facility];  Service: Gynecology;;  . Cystoscopy  06/06/2013    Procedure: CYSTOSCOPY;  Surgeon: Terrance Mass, MD;  Location: [facility];  Service: Gynecology;;    Family History  Problem Relation Age of Onset  . Breast cancer Mother   . Breast cancer Maternal Aunt   . Breast cancer Paternal Grandmother     COLON  . Cancer Paternal Grandfather     THROAT .[name]    Social history:  reports that she quit smoking about 3 years ago. She has never used smokeless tobacco. She reports that she does not drink alcohol or use illicit drugs.    Allergies  Allergen Reactions  . Benadryl [Diphenhydramine Hcl] Hives  . Requip [Ropinirole Hcl]     Increased headache    Medications:  Prior to Admission medications   Medication Sig Start Date End Date Taking? Authorizing Provider  ARIPiprazole (ABILIFY) 5 MG tablet Take 5 mg by mouth daily.   Yes Historical Provider, MD  B Complex Vitamins (B COMPLEX PO) Take 1 tablet by mouth daily.   Yes Historical Provider, MD  Calcium Carbonate-Vitamin D (  CALCIUM 600/VITAMIN D) 600-400 MG-UNIT per tablet Take 1 tablet by mouth 2 (two) times daily.    Yes Historical Provider, MD  Carbidopa-Levodopa ER (SINEMET CR) 25-100 MG tablet controlled release Take 1 tablet by mouth at bedtime. 08/28/15  Yes Kathrynn Ducking, MD  cetirizine (ZYRTEC) 10 MG tablet Take 10 mg by mouth daily.     Yes Historical Provider, MD  Coenzyme Q10 (COQ10) 100 MG CAPS Take 100 mg by mouth daily.   Yes Historical Provider, MD  esomeprazole (NEXIUM) 40 MG capsule Take 40 mg by mouth daily before breakfast.   Yes Historical Provider, MD  gabapentin (NEURONTIN) 800 MG tablet Take 800 mg by mouth 3 (three) times daily.    Yes Historical Provider, MD  LORazepam (ATIVAN) 1 MG tablet Take 1 mg by mouth at  bedtime.    Yes Historical Provider, MD  magnesium gluconate (MAGONATE) 500 MG tablet Take 1,000 mg by mouth 3 (three) times daily.    Yes Historical Provider, MD  Multiple Vitamins-Minerals (MULTIVITAMIN PO) Take by mouth daily.   Yes Historical Provider, MD  OMEGA 3-6-9 FATTY ACIDS PO Take 500 mg by mouth daily.   Yes Historical Provider, MD  OXcarbazepine ER (OXTELLAR XR) 600 MG TB24 Take 1 tablet by mouth daily.   Yes Historical Provider, MD  PROAIR HFA 108 (90 BASE) MCG/ACT inhaler Inhale 1 puff into the lungs as needed. 08/23/13  Yes Historical Provider, MD  rizatriptan (MAXALT) 10 MG tablet Take 10 mg by mouth as needed for migraine. May repeat in 2 hours if needed   Yes Historical Provider, MD  thiamine (VITAMIN B-1) 50 MG tablet Take 50 mg by mouth daily.   Yes Historical Provider, MD  vitamin C (ASCORBIC ACID) 500 MG tablet Take 500 mg by mouth daily.   Yes Historical Provider, MD    ROS:  Out of a complete 14 system review of symptoms, the patient complains only of the following symptoms, and all other reviewed systems are negative.  Fatigue Eyelid twitching, blurred vision Restless legs Environmental allergies Joint pain, achy muscles Depression, anxiety  Blood pressure 120/79, pulse 80, height 5' 6"  (1.676 m), weight 241 lb 8 oz (109.544 kg), last menstrual period 04/25/2013.  Physical Exam  General: The patient is alert and cooperative at the time of the examination. The patient is markedly obese.  Skin: No significant peripheral edema is noted.   Neurologic Exam  Mental status: The patient is alert and oriented x 3 at the time of the examination. The patient has apparent normal recent and remote memory, with an apparently normal attention span and concentration ability.   Cranial nerves: Facial symmetry is present. Speech is normal, no aphasia or dysarthria is noted. Extraocular movements are full. Visual fields are full.  Motor: The patient has good strength in  all 4 extremities.  Sensory examination: Soft touch sensation is symmetric on the face, arms, and legs.  Coordination: The patient has good finger-nose-finger and heel-to-shin bilaterally.  Gait and station: The patient has a normal gait. Tandem gait is normal. Romberg is negative. No drift is seen.  Reflexes: Deep tendon reflexes are symmetric.   Assessment/Plan:  1. Restless leg syndrome  2. Anxiety and depression  3. Sleep disorder  The patient has requested another revisit with Dr. Rexene Alberts through this office, I will try to get this set up. The patient will continue the Sinemet at night, a prescription was written for this. She will follow-up in 6 months, sooner if needed.  I have emphasized the importance of ongoing weight loss, and a regular exercise program in regards to maintaining a healthy sleep cycle.  Jill Alexanders MD 08/28/2015 7:14 PM  [facility] [address] [place], [place] [address]  Phone [phone] Fax [phone]

## 2015-09-02 ENCOUNTER — Encounter: Payer: Self-pay | Admitting: Gynecology

## 2015-09-02 ENCOUNTER — Ambulatory Visit (INDEPENDENT_AMBULATORY_CARE_PROVIDER_SITE_OTHER): Payer: BC Managed Care – PPO | Admitting: Gynecology

## 2015-09-02 VITALS — BP 124/80 | Ht 64.5 in | Wt 242.0 lb

## 2015-09-02 DIAGNOSIS — Z01419 Encounter for gynecological examination (general) (routine) without abnormal findings: Secondary | ICD-10-CM

## 2015-09-02 MED ORDER — NYSTATIN-TRIAMCINOLONE 100000-0.1 UNIT/GM-% EX CREA: 1.0000 "application " | TOPICAL_CREAM | Freq: Three times a day (TID) | CUTANEOUS | Status: DC

## 2015-09-02 NOTE — Patient Instructions (Signed)
Exercise to Lose Weight Exercise and a healthy diet may help you lose weight. Your doctor may suggest specific exercises. EXERCISE IDEAS AND TIPS  Choose low-cost things you enjoy doing, such as walking, bicycling, or exercising to workout videos.   Take stairs instead of the elevator.   Walk during your lunch break.   Park your car further away from work or school.   Go to a gym or an exercise class.   Start with 5 to 10 minutes of exercise each day. Build up to 30 minutes of exercise 4 to 6 days a week.   Wear shoes with good support and comfortable clothes.   Stretch before and after working out.   Work out until you breathe harder and your heart beats faster.   Drink extra water when you exercise.   Do not do so much that you hurt yourself, feel dizzy, or get very short of breath.  Exercises that burn about 150 calories:  Running 1  miles in 15 minutes.   Playing volleyball for 45 to 60 minutes.   Washing and waxing a car for 45 to 60 minutes.   Playing touch football for 45 minutes.   Walking 1  miles in 35 minutes.   Pushing a stroller 1  miles in 30 minutes.   Playing basketball for 30 minutes.   Raking leaves for 30 minutes.   Bicycling 5 miles in 30 minutes.   Walking 2 miles in 30 minutes.   Dancing for 30 minutes.   Shoveling snow for 15 minutes.   Swimming laps for 20 minutes.   Walking up stairs for 15 minutes.   Bicycling 4 miles in 15 minutes.   Gardening for 30 to 45 minutes.   Jumping rope for 15 minutes.   Washing windows or floors for 45 to 60 minutes.  Document Released: 12/11/2010 Document Revised: 07/21/2011 Document Reviewed: 12/11/2010 [facility] Patient Information 2012 [place].                                          Patient information: High cholesterol (The Basics)  What is cholesterol? - Cholesterol is a substance that is found in the blood. Everyone has some. It is needed for good health. The problem is, people  sometimes have too much cholesterol. Compared with people with normal cholesterol, people with high cholesterol have a higher risk of heart attacks, strokes, and other health problems. The higher your cholesterol, the higher your risk of these problems. Cholesterol levels in your body are determined significantly by your diet. Cholesterol levels may also be related to heart disease. The following material helps to explain this relationship and discusses what you can do to help keep your heart healthy. Not all cholesterol is bad. Low-density lipoprotein (LDL) cholesterol is the "bad" cholesterol. It may cause fatty deposits to build up inside your arteries. High-density lipoprotein (HDL) cholesterol is "good." It helps to remove the "bad" LDL cholesterol from your blood. Cholesterol is a very important risk factor for heart disease. Other risk factors are high blood pressure, smoking, stress, heredity, and weight.  The heart muscle gets its supply of blood through the coronary arteries. If your LDL cholesterol is high and your HDL cholesterol is low, you are at risk for having fatty deposits build up in your coronary arteries. This leaves less room through which blood can flow. Without sufficient blood and  oxygen, the heart muscle cannot function properly and you may feel chest pains (angina pectoris). When a coronary artery closes up entirely, a part of the heart muscle may die, causing a heart attack (myocardial infarction).  CHECKING CHOLESTEROL When your caregiver sends your blood to a lab to be analyzed for cholesterol, a complete lipid (fat) profile may be done. With this test, the total amount of cholesterol and levels of LDL and HDL are determined. Triglycerides are a type of fat that circulates in the blood and can also be used to determine heart disease risk. Are there different types of cholesterol? - Yes, there are a few different types. If you get a cholesterol test, you may hear your doctor or  nurse talk about: Total cholesterol  LDL cholesterol - Some people call this the "bad" cholesterol. That's because having high LDL levels raises your risk of heart attacks, strokes, and other health problems.  HDL cholesterol - Some people call this the "good" cholesterol. That's because having high HDL levels lowers your risk of heart attacks, strokes, and other health problems.  Non-HDL cholesterol - Non-HDL cholesterol is your total cholesterol minus your HDL cholesterol.  Triglycerides - Triglycerides are not cholesterol. They are a type of fat. But they often get measured when cholesterol is measured. (Having high triglycerides also seems to increase the risk of heart attacks and strokes.)   Keep in mind, though, that many people who cannot meet these goals still have a low risk of heart attacks and strokes. What should I do if my doctor tells me I have high cholesterol? - Ask your doctor what your overall risk of heart attacks and strokes is. High cholesterol, by itself, is not always a reason to worry. Having high cholesterol is just one of many things that can increase your risk of heart attacks and strokes. Other factors that increase your risk include:  Cigarette smoking  High blood pressure  Having a parent, sister, or brother who got heart disease at a young age ([place], in this case, means younger than 66 for men and younger than 19 for women.)  Being a man (Women are at risk, too, but men have a higher risk.)  Older age  If you are at high risk of heart attacks and strokes, having high cholesterol is a problem. On the other hand, if you have are at low risk, having high cholesterol may not mean much. Should I take medicine to lower cholesterol? - Not everyone who has high cholesterol needs medicines. Your doctor or nurse will decide if you need them based on your age, family history, and other health concerns.  You should probably take a cholesterol-lowering medicine called a statin if  you: Already had a heart attack or stroke  Have known heart disease  Have diabetes  Have a condition called peripheral artery disease, which makes it painful to walk, and happens when the arteries in your legs get clogged with fatty deposits  Have an abdominal aortic aneurysm, which is a widening of the main artery in the belly  Most people with any of the conditions listed above should take a statin no matter what their cholesterol level is. If your doctor or nurse puts you on a statin, stay on it. The medicine may not make you feel any different. But it can help prevent heart attacks, strokes, and death.  Can I lower my cholesterol without medicines? - Yes, you can lower your cholesterol some by:  Avoiding red meat, butter,  fried foods, cheese, and other foods that have a lot of saturated fat  Losing weight (if you are overweight)  Being more active Even if these steps do little to change your cholesterol, they can improve your health in many ways.                                                   Cholesterol Control Diet  CONTROLLING CHOLESTEROL WITH DIET Although exercise and lifestyle factors are important, your diet is key. That is because certain foods are known to raise cholesterol and others to lower it. The goal is to balance foods for their effect on cholesterol and more importantly, to replace saturated and trans fat with other types of fat, such as monounsaturated fat, polyunsaturated fat, and omega-3 fatty acids. On average, a person should consume no more than 15 to 17 g of saturated fat daily. Saturated and trans fats are considered "bad" fats, and they will raise LDL cholesterol. Saturated fats are primarily found in animal products such as meats, butter, and cream. However, that does not mean you need to sacrifice all your favorite foods. Today, there are good tasting, low-fat, low-cholesterol substitutes for most of the things you like to eat. Choose low-fat or nonfat  alternatives. Choose round or loin cuts of red meat, since these types of cuts are lowest in fat and cholesterol. Chicken (without the skin), fish, veal, and ground [place] breast are excellent choices. Eliminate fatty meats, such as hot dogs and salami. Even shellfish have little or no saturated fat. Have a 3 oz (85 g) portion when you eat lean meat, poultry, or fish. Trans fats are also called "partially hydrogenated oils." They are oils that have been scientifically manipulated so that they are solid at room temperature resulting in a longer shelf life and improved taste and texture of foods in which they are added. Trans fats are found in stick margarine, some tub margarines, cookies, crackers, and baked goods.  When baking and cooking, oils are an excellent substitute for butter. The monounsaturated oils are especially beneficial since it is believed they lower LDL and raise HDL. The oils you should avoid entirely are saturated tropical oils, such as coconut and palm.  Remember to eat liberally from food groups that are naturally free of saturated and trans fat, including fish, fruit, vegetables, beans, grains (barley, rice, couscous, bulgur wheat), and pasta (without cream sauces).  IDENTIFYING FOODS THAT LOWER CHOLESTEROL  Soluble fiber may lower your cholesterol. This type of fiber is found in fruits such as apples, vegetables such as broccoli, potatoes, and carrots, legumes such as beans, peas, and lentils, and grains such as barley. Foods fortified with plant sterols (phytosterol) may also lower cholesterol. You should eat at least 2 g per day of these foods for a cholesterol lowering effect.  Read package labels to identify low-saturated fats, trans fats free, and low-fat foods at the supermarket. Select cheeses that have only 2 to 3 g saturated fat per ounce. Use a heart-healthy tub margarine that is free of trans fats or partially hydrogenated oil. When buying baked goods (cookies, crackers), avoid  partially hydrogenated oils. Breads and muffins should be made from whole grains (whole-wheat or whole oat flour, instead of "flour" or "enriched flour"). Buy non-creamy canned soups with reduced salt and no added fats.  FOOD PREPARATION TECHNIQUES  Never deep-fry. If you must fry, either stir-fry, which uses very little fat, or use non-stick cooking sprays. When possible, broil, bake, or roast meats, and steam vegetables. Instead of dressing vegetables with butter or margarine, use lemon and herbs, applesauce and cinnamon (for squash and sweet potatoes), nonfat yogurt, salsa, and low-fat dressings for salads.  LOW-SATURATED FAT / LOW-FAT FOOD SUBSTITUTES Meats / Saturated Fat (g)  Avoid: Steak, marbled (3 oz/85 g) / 11 g   Choose: Steak, lean (3 oz/85 g) / 4 g   Avoid: Hamburger (3 oz/85 g) / 7 g   Choose: Hamburger, lean (3 oz/85 g) / 5 g   Avoid: Ham (3 oz/85 g) / 6 g   Choose: Ham, lean cut (3 oz/85 g) / 2.4 g   Avoid: Chicken, with skin, dark meat (3 oz/85 g) / 4 g   Choose: Chicken, skin removed, dark meat (3 oz/85 g) / 2 g   Avoid: Chicken, with skin, light meat (3 oz/85 g) / 2.5 g   Choose: Chicken, skin removed, light meat (3 oz/85 g) / 1 g  Dairy / Saturated Fat (g)  Avoid: Whole milk (1 cup) / 5 g   Choose: Low-fat milk, 2% (1 cup) / 3 g   Choose: Low-fat milk, 1% (1 cup) / 1.5 g   Choose: Skim milk (1 cup) / 0.3 g   Avoid: Hard cheese (1 oz/28 g) / 6 g   Choose: Skim milk cheese (1 oz/28 g) / 2 to 3 g   Avoid: Cottage cheese, 4% fat (1 cup) / 6.5 g   Choose: Low-fat cottage cheese, 1% fat (1 cup) / 1.5 g   Avoid: Ice cream (1 cup) / 9 g   Choose: Sherbet (1 cup) / 2.5 g   Choose: Nonfat frozen yogurt (1 cup) / 0.3 g   Choose: Frozen fruit bar / trace   Avoid: Whipped cream (1 tbs) / 3.5 g   Choose: Nondairy whipped topping (1 tbs) / 1 g  Condiments / Saturated Fat (g)  Avoid: Mayonnaise (1 tbs) / 2 g   Choose: Low-fat mayonnaise (1 tbs) / 1 g    Avoid: Butter (1 tbs) / 7 g   Choose: Extra light margarine (1 tbs) / 1 g   Avoid: Coconut oil (1 tbs) / 11.8 g   Choose: Olive oil (1 tbs) / 1.8 g   Choose: Corn oil (1 tbs) / 1.7 g   Choose: Safflower oil (1 tbs) / 1.2 g   Choose: Sunflower oil (1 tbs) / 1.4 g   Choose: Soybean oil (1 tbs) / 2.4 g   Choose: Canola oil (1 tbs) / 1 g

## 2015-09-02 NOTE — Progress Notes (Signed)
Gloria [name] Jan 11, 1971 [id number]   History:    44 y.o.  for annual gyn exam with no complaints today. Review of patient's lab work last year had indicated her triglycerides were up to 407 which she states that her PCP Dr. Ahmed Prima had done her blood work 2 weeks ago. She's currently on no medications states all her lab work were normal. She has been on multiple medications for depression.Patient had past history of alcohol abuse and smoker and has been over 5 years and she has been dry. Last year patient had a transvaginal hysterectomy with partial right salpingectomy along with diagnostic laparoscopy and laparotomy with cystoscopy secondary to menorrhagia, anemia, fibroid uterus and intraoperative hemorrhage. Her pathology report was as follows:  Uterus and cervix, with right fallopian tube  - CERVIX: UNREMARKABLE.  - ENDOMETRIUM: PROLIFERATIVE WITH INTRAUTERINE DEVICE.  - MYOMETRIUM: LEIOMYOMA.  - UTERINE SEROSA: UNREMARKABLE. NO ENDOMETRIOSIS OR MALIGNANCY.  - RIGHT FALLOPIAN TUBE: UNREMARKABLE. NO ENDOMETRIOSIS OR MALIGNANCY.   Patient done well since her surgery. Patient prior to her hysterectomy has had normal Pap smears.   Past medical history,surgical history, family history and social history were all reviewed and documented in the EPIC chart.  Gynecologic History Patient's last menstrual period was 04/25/2013. Contraception: status post hysterectomy Last Pap: 2011. Results were: normal Last mammogram: 2016. Results were: Normal but dense  Obstetric History OB History  Gravida Para Term Preterm AB SAB TAB Ectopic Multiple Living  1 1 1       1     # Outcome Date GA Lbr Len/2nd Weight Sex Delivery Anes PTL Lv  1 Term     M Vag-Spont  N Y       ROS: A ROS was performed and pertinent positives and negatives are included in the history.  GENERAL: No fevers or chills. HEENT: No change in vision, no earache, sore throat or sinus congestion. NECK: No pain or stiffness.  CARDIOVASCULAR: No chest pain or pressure. No palpitations. PULMONARY: No shortness of breath, cough or wheeze. GASTROINTESTINAL: No abdominal pain, nausea, vomiting or diarrhea, melena or bright red blood per rectum. GENITOURINARY: No urinary frequency, urgency, hesitancy or dysuria. MUSCULOSKELETAL: No joint or muscle pain, no back pain, no recent trauma. DERMATOLOGIC: No rash, no itching, no lesions. ENDOCRINE: No polyuria, polydipsia, no heat or cold intolerance. No recent change in weight. HEMATOLOGICAL: No anemia or easy bruising or bleeding. NEUROLOGIC: No headache, seizures, numbness, tingling or weakness. PSYCHIATRIC: No depression, no loss of interest in normal activity or change in sleep pattern.     Exam: chaperone present  BP 124/80 mmHg  Ht 5' 4.5" (1.638 m)  Wt 242 lb (109.77 kg)  BMI 40.91 kg/m2  LMP 04/25/2013  Body mass index is 40.91 kg/(m^2).  General appearance : Well developed well nourished female. No acute distress HEENT: Eyes: no retinal hemorrhage or exudates,  Neck supple, trachea midline, no carotid bruits, no thyroidmegaly Lungs: Clear to auscultation, no rhonchi or wheezes, or rib retractions  Heart: Regular rate and rhythm, no murmurs or gallops Breast:Examined in sitting and supine position were symmetrical in appearance, no palpable masses or tenderness,  no skin retraction, no nipple inversion, no nipple discharge, no skin discoloration, no axillary or supraclavicular lymphadenopathy Abdomen: no palpable masses or tenderness, no rebound or guarding Extremities: no edema or skin discoloration or tenderness  Pelvic:  Bartholin, Urethra, Skene Glands: Within normal limits             Vagina: No gross lesions  or discharge  Cervix: Absent  Uterus absent  Adnexa  Without masses or tenderness  Anus and perineum  normal   Rectovaginal  normal sphincter tone without palpated masses or tenderness             Hemoccult not indicated     Assessment/Plan:  44  y.o. female for annual exam or receive her flu vaccine next week. Her PCP has been doing her blood work. Patient states that at times she feels like a rash underneath her right armpit. None were noted today. I'm going to prescribe her prescription of mytrex cream to apply twice a day for 7-10 days in the event of a rash recurrence. Literature information on exercise and cholesterol lowering diet handout was provided.   Terrance Mass MD, 4:34 PM 09/02/2015

## 2015-09-02 NOTE — Addendum Note (Signed)
Addended by: Terrance Mass on: 09/02/2015 04:44 PM   Modules accepted: Orders

## 2015-09-04 ENCOUNTER — Encounter: Payer: Self-pay | Admitting: Gynecology

## 2015-09-24 ENCOUNTER — Telehealth: Payer: Self-pay | Admitting: Neurology

## 2015-09-24 NOTE — Telephone Encounter (Signed)
Per mutual agreement between Dr. Jannifer Franklin and I, please notify patient that tomorrow's appt is not necessary and is not going to add anything more.

## 2015-09-24 NOTE — Telephone Encounter (Signed)
I have spoken with Gloria [name] this afternoon, and per Dr. Rexene Alberts, have advised that after reviewing her chart with Dr. Jannifer Franklin, Dr. Rexene Alberts does not feel she has anything to add to what Dr. Jannifer Franklin has already done for her.  Pt. verbalized understanding of same, and I have cancelled her appt. with Dr. Noralee Chars

## 2015-09-25 ENCOUNTER — Ambulatory Visit: Payer: BC Managed Care – PPO | Admitting: Neurology

## 2015-11-08 ENCOUNTER — Emergency Department (HOSPITAL_BASED_OUTPATIENT_CLINIC_OR_DEPARTMENT_OTHER)
Admission: EM | Admit: 2015-11-08 | Discharge: 2015-11-08 | Disposition: A | Discharge status: Home / Self Care | Payer: BC Managed Care – PPO | Attending: Emergency Medicine | Admitting: Emergency Medicine

## 2015-11-08 ENCOUNTER — Encounter (HOSPITAL_BASED_OUTPATIENT_CLINIC_OR_DEPARTMENT_OTHER): Payer: Self-pay | Admitting: Emergency Medicine

## 2015-11-08 ENCOUNTER — Emergency Department (HOSPITAL_BASED_OUTPATIENT_CLINIC_OR_DEPARTMENT_OTHER): Payer: BC Managed Care – PPO

## 2015-11-08 DIAGNOSIS — F419 Anxiety disorder, unspecified: Secondary | ICD-10-CM | POA: Insufficient documentation

## 2015-11-08 DIAGNOSIS — Z79899 Other long term (current) drug therapy: Secondary | ICD-10-CM | POA: Insufficient documentation

## 2015-11-08 DIAGNOSIS — M797 Fibromyalgia: Secondary | ICD-10-CM | POA: Insufficient documentation

## 2015-11-08 DIAGNOSIS — Z87891 Personal history of nicotine dependence: Secondary | ICD-10-CM | POA: Diagnosis not present

## 2015-11-08 DIAGNOSIS — M1990 Unspecified osteoarthritis, unspecified site: Secondary | ICD-10-CM | POA: Diagnosis not present

## 2015-11-08 DIAGNOSIS — K219 Gastro-esophageal reflux disease without esophagitis: Secondary | ICD-10-CM | POA: Insufficient documentation

## 2015-11-08 DIAGNOSIS — F329 Major depressive disorder, single episode, unspecified: Secondary | ICD-10-CM | POA: Diagnosis not present

## 2015-11-08 DIAGNOSIS — Z862 Personal history of diseases of the blood and blood-forming organs and certain disorders involving the immune mechanism: Secondary | ICD-10-CM | POA: Diagnosis not present

## 2015-11-08 DIAGNOSIS — Y9389 Activity, other specified: Secondary | ICD-10-CM | POA: Insufficient documentation

## 2015-11-08 DIAGNOSIS — E669 Obesity, unspecified: Secondary | ICD-10-CM | POA: Diagnosis not present

## 2015-11-08 DIAGNOSIS — M549 Dorsalgia, unspecified: Secondary | ICD-10-CM

## 2015-11-08 DIAGNOSIS — E785 Hyperlipidemia, unspecified: Secondary | ICD-10-CM | POA: Diagnosis not present

## 2015-11-08 DIAGNOSIS — X58XXXA Exposure to other specified factors, initial encounter: Secondary | ICD-10-CM | POA: Insufficient documentation

## 2015-11-08 DIAGNOSIS — Y998 Other external cause status: Secondary | ICD-10-CM | POA: Diagnosis not present

## 2015-11-08 DIAGNOSIS — G8929 Other chronic pain: Secondary | ICD-10-CM | POA: Diagnosis not present

## 2015-11-08 DIAGNOSIS — Y92002 Bathroom of unspecified non-institutional (private) residence single-family (private) house as the place of occurrence of the external cause: Secondary | ICD-10-CM | POA: Insufficient documentation

## 2015-11-08 DIAGNOSIS — S3992XA Unspecified injury of lower back, initial encounter: Secondary | ICD-10-CM | POA: Insufficient documentation

## 2015-11-08 MED ORDER — HYDROMORPHONE HCL 1 MG/ML IJ SOLN
1.0000 mg | Freq: Once | INTRAMUSCULAR | Status: AC
  Administered 2015-11-08: 1 mg via INTRAMUSCULAR
  Filled 2015-11-08: qty 1

## 2015-11-08 MED ORDER — METHYLPREDNISOLONE SODIUM SUCC 125 MG IJ SOLR
125.0000 mg | Freq: Once | INTRAMUSCULAR | Status: AC
  Administered 2015-11-08: 125 mg via INTRAMUSCULAR
  Filled 2015-11-08: qty 2

## 2015-11-08 MED ORDER — PREDNISONE 20 MG PO TABS: 40.0000 mg | ORAL_TABLET | Freq: Every day | ORAL | Status: DC

## 2015-11-08 MED ORDER — OXYCODONE-ACETAMINOPHEN 5-325 MG PO TABS: 1.0000 | ORAL_TABLET | ORAL | Status: DC | PRN

## 2015-11-08 NOTE — ED Notes (Signed)
Patient states that she hurt her back about 2 weeks ago. The patient went to her PMD and was given medications. The patient reports that the pain is worse now. Denies any loss of bladder or bowel function. Pain radiates down both legs.

## 2015-11-08 NOTE — ED Provider Notes (Signed)
CSN: [id number]     Arrival date & time 11/08/15  1613 History   First MD Initiated Contact with Patient 11/08/15 1732     Chief Complaint  Patient presents with  . Back Pain     (Consider location/radiation/quality/duration/timing/severity/associated sxs/prior Treatment) The history is provided by the patient and medical records.     44 year old female with history of fibromyalgia, hyperlipidemia, anemia, anxiety, depression, history of alcohol abuse, chronic back pain, presenting to the ED for low back pain. Patient states she hurt her back approximately 2 weeks ago. She states she was standing on a step stool attempting to reach a sweater in the top of her closet, she twisted to the right and felt a "pull" in her low back. She states that this time she has had persistent pain in her low back, worse on the right side.  She states she has pain radiating down her right leg, she states sometimes it is anterior, other times posterior. She states this seems to change depending on what position she is sitting or lying in. She denies any numbness or weakness of her legs. No bowel or bladder incontinence.  Patient denies any history of prior back surgeries, she has had neck surgeries. Patient saw her PCP for this earlier this week and was started on Flexeril, Motrin, and then Voltaren, however she has not had any relief. She was referred to orthopedics, however they cannot see her for another 5 days.  Patient states she does have history of chronic back pain, she states approximately once a year her back will "completely go out". No other injuries, falls, or trauma noted. Denies fever, chills, sweats. No hx of IVDU or cancer.  Does have known DJD in her lumbar spine VSS.  Past Medical History  Diagnosis Date  . Smoker   . History of ETOH abuse   . Elevated LFTs   . Fibromyalgia   . Bulging disc     X 2  . Arthritis   . Hyperlipidemia   . Osteoarthritis     lower back  . Obese   . Periodic limb  movement disorder 02/25/2014  . Anemia   . Allergy   . Anxiety   . Depression   . GERD (gastroesophageal reflux disease)   . Substance abuse   . Obstructive sleep apnea 02/25/2015  . Chronic back pain 02/25/2015   Past Surgical History  Procedure Laterality Date  . Intrauterine device insertion  08/09/2008    PARAGUARD  . Neck surgery    . Kidney surgery  1995    abcess  . Radial tunnel Bilateral 2005  . Vaginal hysterectomy Left 06/06/2013    Procedure: Vaginal Hysterectomy with Patiial Right Salpingectomy;  Surgeon: Terrance Mass, MD;  Location: [facility];  Service: Gynecology;  Laterality: Left;  . Laparoscopy N/A 06/06/2013    Procedure: LAPAROSCOPY DIAGNOSTIC;  Surgeon: Terrance Mass, MD;  Location: [facility];  Service: Gynecology;  Laterality: N/A;  . Laparotomy  06/06/2013    Procedure: EXPLORATORY LAPAROTOMY;  Surgeon: Terrance Mass, MD;  Location: [facility];  Service: Gynecology;;  . Cystoscopy  06/06/2013    Procedure: CYSTOSCOPY;  Surgeon: Terrance Mass, MD;  Location: [facility];  Service: Gynecology;;   Family History  Problem Relation Age of Onset  . Breast cancer Mother   . Breast cancer Maternal Aunt   . Breast cancer Paternal Grandmother     COLON  . Cancer Paternal Grandfather     THROAT .[name]  Social History  Substance Use Topics  . Smoking status: Former Smoker -- 1.00 packs/day    Quit date: 01/26/2012  . Smokeless tobacco: Never Used  . Alcohol Use: No     Comment: History of alcohol abuse   OB History    Gravida Para Term Preterm AB TAB SAB Ectopic Multiple Living   1 1 1       1      Review of Systems  Musculoskeletal: Positive for back pain.  All other systems reviewed and are negative.     Allergies  Benadryl and Requip  Home Medications   Prior to Admission medications   Medication Sig Start Date End Date Taking? Authorizing Provider  ARIPiprazole (ABILIFY) 5 MG tablet Take 5 mg by mouth daily.    Historical Provider, MD  B Complex Vitamins  (B COMPLEX PO) Take 1 tablet by mouth daily.    Historical Provider, MD  Calcium Carbonate-Vitamin D (CALCIUM 600/VITAMIN D) 600-400 MG-UNIT per tablet Take 1 tablet by mouth 2 (two) times daily.     Historical Provider, MD  Carbidopa-Levodopa ER (SINEMET CR) 25-100 MG tablet controlled release Take 1 tablet by mouth at bedtime. 08/28/15   Kathrynn Ducking, MD  cetirizine (ZYRTEC) 10 MG tablet Take 10 mg by mouth daily.      Historical Provider, MD  Coenzyme Q10 (COQ10) 100 MG CAPS Take 100 mg by mouth daily.    Historical Provider, MD  esomeprazole (NEXIUM) 40 MG capsule Take 40 mg by mouth daily before breakfast.    Historical Provider, MD  gabapentin (NEURONTIN) 800 MG tablet Take 800 mg by mouth 3 (three) times daily.     Historical Provider, MD  LORazepam (ATIVAN) 1 MG tablet Take 1 mg by mouth at bedtime.     Historical Provider, MD  magnesium gluconate (MAGONATE) 500 MG tablet Take 1,000 mg by mouth 3 (three) times daily.     Historical Provider, MD  Multiple Vitamins-Minerals (MULTIVITAMIN PO) Take by mouth daily.    Historical Provider, MD  nystatin-triamcinolone (MYCOLOG II) cream Apply 1 application topically 3 (three) times daily. 09/02/15   Terrance Mass, MD  OMEGA 3-6-9 FATTY ACIDS PO Take 500 mg by mouth daily.    Historical Provider, MD  OXcarbazepine ER (OXTELLAR XR) 600 MG TB24 Take 1 tablet by mouth daily.    Historical Provider, MD  PROAIR HFA 108 (90 BASE) MCG/ACT inhaler Inhale 1 puff into the lungs as needed. 08/23/13   Historical Provider, MD  rizatriptan (MAXALT) 10 MG tablet Take 10 mg by mouth as needed for migraine. May repeat in 2 hours if needed    Historical Provider, MD  thiamine (VITAMIN B-1) 50 MG tablet Take 50 mg by mouth daily.    Historical Provider, MD  vitamin C (ASCORBIC ACID) 500 MG tablet Take 500 mg by mouth daily.    Historical Provider, MD   BP 122/65 mmHg  Pulse 76  Temp(Src) 98.2 F (36.8 C) (Oral)  Resp 20  Ht 5' 5"  (1.651 m)  Wt 112.492 kg   BMI 41.27 kg/m2  SpO2 100%  LMP 04/25/2013   Physical Exam  Constitutional: She is oriented to person, place, and time. She appears well-developed and well-nourished. No distress.  tearful  HENT:  Head: Normocephalic and atraumatic.  Mouth/Throat: Oropharynx is clear and moist.  Eyes: Conjunctivae and EOM are normal. Pupils are equal, round, and reactive to light.  Neck: Normal range of motion. Neck supple.  Cardiovascular: Normal rate, regular rhythm  and normal heart sounds.   Pulmonary/Chest: Effort normal and breath sounds normal. No respiratory distress. She has no wheezes.  Musculoskeletal: Normal range of motion.       Lumbar back: She exhibits tenderness, bony tenderness and pain.       Back:  TTP of right SI joint; no midline deformity or step-off; + SLR on right. - on left; normal strength and sensation of BLE  Neurological: She is alert and oriented to person, place, and time.  Skin: Skin is warm and dry. She is not diaphoretic.  Psychiatric: She has a normal mood and affect.  Nursing note and vitals reviewed.   ED Course  Procedures (including critical care time) Labs Review Labs Reviewed - No data to display  Imaging Review Dg Lumbar Spine Complete  11/08/2015  CLINICAL DATA:  Low back injury about 2 weeks ago, pain worse today, pain radiating down both legs. EXAM: LUMBAR SPINE - COMPLETE 4+ VIEW COMPARISON:  CT abdomen pelvis dated 09/25/2008. FINDINGS: Mild degenerative change noted within the lower lumbar spine with associated mild disc space narrowings and mild osseous spurring. There is a mild dextroscoliosis of the lumbar spine which may be positional in nature. No fracture line or displaced fracture fragment seen. No acute -appearing cortical irregularity or osseous lesion. Overall bone mineralization is within normal limits. Upper sacrum appears intact and well aligned. Paravertebral soft tissues are unremarkable. Cholecystectomy clips noted within the right upper  quadrant. IMPRESSION: 1. Mild degenerative change in the lower lumbar spine. 2. No acute findings. Electronically Signed   By: Franki Cabot M.D.   On: 11/08/2015 18:54   I have personally reviewed and evaluated these images and lab results as part of my medical decision-making.   EKG Interpretation None      MDM   Final diagnoses:  Back pain, unspecified location   44 year old female here with low back pain for the past 2 weeks after a twisting injury while retrieving items out of her closet. Patient is afebrile, nontoxic. She has tenderness over right SI joint without noted deformity. She has positive straight leg raise on right, negative on left. No red flag symptoms or focal neurologic deficits to suggest cauda equina, spinal cord injury, epidural abscess, or other emergent spinal pathology. Plain films were obtained revealing arthritic changes which were known, no acute vertebral fracture or disc herniation.  Patient was treated in the ED with steroids and Dilaudid, she reports significant decrease in pain. She was able to sit upright in bed independently but she has not been able to do for the past week. She states radiation of pain into her right leg has resolved at this time as well. She remains neurologically intact. Suspect lumbar radiculopathy. Will discharge home with short course of pain medication as well as prednisone taper. Patient has previously arranged follow-up with orthopedics next week.  Discussed plan with patient, he/she acknowledged understanding and agreed with plan of care.  Return precautions given for new or worsening symptoms.  Patient able to ambulate out of ED with steady gait.  Larene Pickett, PA-C 11/08/15 2036  Malvin Johns, MD 11/08/15 2250

## 2015-11-08 NOTE — ED Notes (Signed)
Per pt report been on flexeril and ibuprofen and Voltaren po since injury with no relief. Increase pain when walking that shooting down rt leg more than lt leg , no loss of bowel or bladder control.

## 2015-11-08 NOTE — Discharge Instructions (Signed)
Stop taking the other medications you have, start taking the medications i have prescribed for you. Limit heavy lifting, pushing, pulling, or other activities that will further strain your back. If you continue having pain, follow-up with orthopedics on Wednesday as scheduled. I have attached copies of your x-ray report on back for their review. Return here for any new concerns.

## 2015-11-08 NOTE — ED Notes (Signed)
Patient transported to X-ray 

## 2015-11-11 ENCOUNTER — Emergency Department (HOSPITAL_COMMUNITY)
Admission: EM | Admit: 2015-11-11 | Discharge: 2015-11-11 | Disposition: A | Discharge status: Home / Self Care | Payer: BC Managed Care – PPO | Attending: Emergency Medicine | Admitting: Emergency Medicine

## 2015-11-11 ENCOUNTER — Encounter (HOSPITAL_COMMUNITY): Payer: Self-pay

## 2015-11-11 DIAGNOSIS — M1990 Unspecified osteoarthritis, unspecified site: Secondary | ICD-10-CM | POA: Diagnosis not present

## 2015-11-11 DIAGNOSIS — E669 Obesity, unspecified: Secondary | ICD-10-CM | POA: Diagnosis not present

## 2015-11-11 DIAGNOSIS — F329 Major depressive disorder, single episode, unspecified: Secondary | ICD-10-CM | POA: Diagnosis not present

## 2015-11-11 DIAGNOSIS — F419 Anxiety disorder, unspecified: Secondary | ICD-10-CM | POA: Insufficient documentation

## 2015-11-11 DIAGNOSIS — Z87891 Personal history of nicotine dependence: Secondary | ICD-10-CM | POA: Insufficient documentation

## 2015-11-11 DIAGNOSIS — M797 Fibromyalgia: Secondary | ICD-10-CM | POA: Insufficient documentation

## 2015-11-11 DIAGNOSIS — G8929 Other chronic pain: Secondary | ICD-10-CM | POA: Diagnosis not present

## 2015-11-11 DIAGNOSIS — Z862 Personal history of diseases of the blood and blood-forming organs and certain disorders involving the immune mechanism: Secondary | ICD-10-CM | POA: Insufficient documentation

## 2015-11-11 DIAGNOSIS — M545 Low back pain, unspecified: Secondary | ICD-10-CM

## 2015-11-11 DIAGNOSIS — Z79899 Other long term (current) drug therapy: Secondary | ICD-10-CM | POA: Insufficient documentation

## 2015-11-11 DIAGNOSIS — E785 Hyperlipidemia, unspecified: Secondary | ICD-10-CM | POA: Insufficient documentation

## 2015-11-11 MED ORDER — OXYCODONE-ACETAMINOPHEN 5-325 MG PO TABS
1.0000 | ORAL_TABLET | Freq: Once | ORAL | Status: AC
Start: 2015-11-11 — End: 2015-11-11
  Administered 2015-11-11: 1 via ORAL
  Filled 2015-11-11: qty 1

## 2015-11-11 MED ORDER — HYDROMORPHONE HCL 1 MG/ML IJ SOLN
1.0000 mg | Freq: Once | INTRAMUSCULAR | Status: AC
  Administered 2015-11-11: 1 mg via INTRAMUSCULAR
  Filled 2015-11-11: qty 1

## 2015-11-11 NOTE — ED Provider Notes (Signed)
CSN: [id number]     Arrival date & time 11/11/15  4270 History   First MD Initiated Contact with Patient 11/11/15 [phone]     Chief Complaint  Patient presents with  . Back Pain     (Consider location/radiation/quality/duration/timing/severity/associated sxs/prior Treatment) HPI Comments: Patient presents to the emergency department with chief complaint of low back pain. She states that she has had the back pain for 3 weeks. She states that it first started after she bent over and twisted while getting something out of her close it. She has been seen by her primary care doctor and treated for muscle strain. She was recently seen in the ED and given pain medication and steroid. She states that she occasionally has radiating pain to her lower extremities. She denies any bowel or bladder incontinence. She denies any neurologic deficits. Denies any numbness, tingling, or weakness.  The history is provided by the patient. No language interpreter was used.    Past Medical History  Diagnosis Date  . Smoker   . History of ETOH abuse   . Elevated LFTs   . Fibromyalgia   . Bulging disc     X 2  . Arthritis   . Hyperlipidemia   . Osteoarthritis     lower back  . Obese   . Periodic limb movement disorder 02/25/2014  . Anemia   . Allergy   . Anxiety   . Depression   . GERD (gastroesophageal reflux disease)   . Substance abuse   . Obstructive sleep apnea 02/25/2015  . Chronic back pain 02/25/2015   Past Surgical History  Procedure Laterality Date  . Intrauterine device insertion  08/09/2008    PARAGUARD  . Neck surgery    . Kidney surgery  1995    abcess  . Radial tunnel Bilateral 2005  . Vaginal hysterectomy Left 06/06/2013    Procedure: Vaginal Hysterectomy with Patiial Right Salpingectomy;  Surgeon: Terrance Mass, MD;  Location: [facility];  Service: Gynecology;  Laterality: Left;  . Laparoscopy N/A 06/06/2013    Procedure: LAPAROSCOPY DIAGNOSTIC;  Surgeon: Terrance Mass, MD;  Location:  [facility];  Service: Gynecology;  Laterality: N/A;  . Laparotomy  06/06/2013    Procedure: EXPLORATORY LAPAROTOMY;  Surgeon: Terrance Mass, MD;  Location: [facility];  Service: Gynecology;;  . Cystoscopy  06/06/2013    Procedure: CYSTOSCOPY;  Surgeon: Terrance Mass, MD;  Location: [facility];  Service: Gynecology;;   Family History  Problem Relation Age of Onset  . Breast cancer Mother   . Breast cancer Maternal Aunt   . Breast cancer Paternal Grandmother     COLON  . Cancer Paternal Grandfather     THROAT .[name]   Social History  Substance Use Topics  . Smoking status: Former Smoker -- 1.00 packs/day    Quit date: 01/26/2012  . Smokeless tobacco: Never Used  . Alcohol Use: No     Comment: History of alcohol abuse   OB History    Gravida Para Term Preterm AB TAB SAB Ectopic Multiple Living   1 1 1       1      Review of Systems  Constitutional: Negative for fever and chills.  Respiratory: Negative for shortness of breath.   Cardiovascular: Negative for chest pain.  Gastrointestinal: Negative for nausea, vomiting, diarrhea and constipation.       No bowel incontinence  Genitourinary: Negative for dysuria.       No urinary incontinence  Musculoskeletal: Positive for  myalgias, back pain and arthralgias.  Neurological:       No saddle anesthesia  All other systems reviewed and are negative.     Allergies  Benadryl and Requip  Home Medications   Prior to Admission medications   Medication Sig Start Date End Date Taking? Authorizing Provider  B Complex Vitamins (B COMPLEX PO) Take 1 tablet by mouth daily.   Yes Historical Provider, MD  Calcium Carbonate-Vitamin D (CALCIUM 600/VITAMIN D) 600-400 MG-UNIT per tablet Take 1 tablet by mouth 2 (two) times daily.    Yes Historical Provider, MD  Carbidopa-Levodopa ER (SINEMET CR) 25-100 MG tablet controlled release Take 1 tablet by mouth at bedtime. 08/28/15  Yes Kathrynn Ducking, MD  cetirizine (ZYRTEC) 10 MG tablet Take 10 mg by mouth  daily.     Yes Historical Provider, MD  Coenzyme Q10 (COQ10) 100 MG CAPS Take 100 mg by mouth daily.   Yes Historical Provider, MD  esomeprazole (NEXIUM) 40 MG capsule Take 40 mg by mouth daily before breakfast.   Yes Historical Provider, MD  gabapentin (NEURONTIN) 800 MG tablet Take 800 mg by mouth 3 (three) times daily.    Yes Historical Provider, MD  LORazepam (ATIVAN) 1 MG tablet Take 1 mg by mouth at bedtime.    Yes Historical Provider, MD  magnesium gluconate (MAGONATE) 500 MG tablet Take 1,000 mg by mouth 3 (three) times daily.    Yes Historical Provider, MD  Multiple Vitamins-Minerals (MULTIVITAMIN PO) Take 1 tablet by mouth daily.    Yes Historical Provider, MD  OMEGA 3-6-9 FATTY ACIDS PO Take 500 mg by mouth daily.   Yes Historical Provider, MD  OXcarbazepine ER (OXTELLAR XR) 600 MG TB24 Take 1 tablet by mouth daily.   Yes Historical Provider, MD  oxyCODONE-acetaminophen (PERCOCET/ROXICET) 5-325 MG tablet Take 1 tablet by mouth every 4 (four) hours as needed. Patient taking differently: Take 1 tablet by mouth every 4 (four) hours as needed (pain).  11/08/15  Yes Larene Pickett, PA-C  predniSONE (DELTASONE) 20 MG tablet Take 2 tablets (40 mg total) by mouth daily. Take 40 mg by mouth daily for 3 days, then 13m by mouth daily for 3 days, then 123mdaily for 3 days 11/08/15  Yes LiLarene PickettPA-C  PROAIR HFA 108 (90 BASE) MCG/ACT inhaler Inhale 1 puff into the lungs every 4 (four) hours as needed for wheezing or shortness of breath.  08/23/13  Yes Historical Provider, MD  REXULTI 3 MG TABS Take 1 tablet by mouth daily. 11/04/15  Yes Historical Provider, MD  rizatriptan (MAXALT) 10 MG tablet Take 10 mg by mouth as needed for migraine. May repeat in 2 hours if needed   Yes Historical Provider, MD  thiamine (VITAMIN B-1) 50 MG tablet Take 50 mg by mouth daily.   Yes Historical Provider, MD  vitamin C (ASCORBIC ACID) 500 MG tablet Take 500 mg by mouth daily.   Yes Historical Provider, MD   BP  145/74 mmHg  Pulse 77  Temp(Src) 98.6 F (37 C) (Oral)  Resp 20  SpO2 99%  LMP 04/25/2013 Physical Exam  Constitutional: She is oriented to person, place, and time. She appears well-developed and well-nourished. No distress.  HENT:  Head: Normocephalic and atraumatic.  Eyes: Conjunctivae and EOM are normal. Right eye exhibits no discharge. Left eye exhibits no discharge. No scleral icterus.  Neck: Normal range of motion. Neck supple. No tracheal deviation present.  Cardiovascular: Normal rate, regular rhythm and normal heart sounds.  Exam reveals no gallop and no friction rub.   No murmur heard. Pulmonary/Chest: Effort normal and breath sounds normal. No respiratory distress. She has no wheezes.  Abdominal: Soft. She exhibits no distension. There is no tenderness.  Musculoskeletal: Normal range of motion.  Lumbar paraspinal muscles tender to palpation, no bony tenderness, step-offs, or gross abnormality or deformity of spine, patient is able to ambulate, moves all extremities  Bilateral great toe extension intact Bilateral plantar/dorsiflexion intact  Neurological: She is alert and oriented to person, place, and time. She has normal reflexes.  Sensation and strength intact bilaterally Symmetrical reflexes  Skin: Skin is warm. She is not diaphoretic.  Psychiatric: She has a normal mood and affect. Her behavior is normal. Judgment and thought content normal.  Nursing note and vitals reviewed.   ED Course  Procedures (including critical care time)   MDM   Final diagnoses:  Right-sided low back pain without sciatica    Patient with back pain.  No neurological deficits and normal neuro exam.  Patient is ambulatory.  No loss of bowel or bladder control.  Doubt cauda equina.  Denies fever,  doubt epidural abscess or other lesion. Recommend back exercises, stretching, RICE.  Encouraged the patient that there could be a need for additional workup and/or imaging such as MRI, if the  symptoms do not resolve. Patient advised that if the back pain does not resolve, or radiates, this could progress to more serious conditions and is encouraged to follow-up with PCP or orthopedics within 2 weeks.       Montine Circle, PA-C 11/11/15 [place], MD 11/12/15 [phone]

## 2015-11-11 NOTE — ED Notes (Signed)
Pt c/o lower back pain, worse w/ movement x 1 week. Pt states pain has increased since being seen at other ED on Saturday. Pt reports no relief from pain meds prescribed. Denies numbness/tingling/loss of bowel or bladder control. Pt tearful at this time.

## 2015-11-11 NOTE — ED Notes (Signed)
Pt. Crying in pain and unable to stand. PA rob notified.

## 2015-11-11 NOTE — Discharge Instructions (Signed)
Back Pain, Adult °Back pain is very common in adults. The cause of back pain is rarely dangerous and the pain often gets better over time. The cause of your back pain may not be known. Some common causes of back pain include: °1. Strain of the muscles or ligaments supporting the spine. °2. Wear and tear (degeneration) of the spinal disks. °3. Arthritis. °4. Direct injury to the back. °For many people, back pain may return. Since back pain is rarely dangerous, most people can learn to manage this condition on their own. °HOME CARE INSTRUCTIONS °Watch your back pain for any changes. The following actions may help to lessen any discomfort you are feeling: °1. Remain active. It is stressful on your back to sit or stand in one place for long periods of time. Do not sit, drive, or stand in one place for more than 30 minutes at a time. Take short walks on even surfaces as soon as you are able. Try to increase the length of time you walk each day. °2. Exercise regularly as directed by your health care provider. Exercise helps your back heal faster. It also helps avoid future injury by keeping your muscles strong and flexible. °3. Do not stay in bed. Resting more than 1-2 days can delay your recovery. °4. Pay attention to your body when you bend and lift. The most comfortable positions are those that put less stress on your recovering back. Always use proper lifting techniques, including: °1. Bending your knees. °2. Keeping the load close to your body. °3. Avoiding twisting. °5. Find a comfortable position to sleep. Use a firm mattress and lie on your side with your knees slightly bent. If you lie on your back, put a pillow under your knees. °6. Avoid feeling anxious or stressed. Stress increases muscle tension and can worsen back pain. It is important to recognize when you are anxious or stressed and learn ways to manage it, such as with exercise. °7. Take medicines only as directed by your health care provider.  Over-the-counter medicines to reduce pain and inflammation are often the most helpful. Your health care provider may prescribe muscle relaxant drugs. These medicines help dull your pain so you can more quickly return to your normal activities and healthy exercise. °8. Apply ice to the injured area: °1. Put ice in a plastic bag. °2. Place a towel between your skin and the bag. °3. Leave the ice on for 20 minutes, 2-3 times a day for the first 2-3 days. After that, ice and heat may be alternated to reduce pain and spasms. °9. Maintain a healthy weight. Excess weight puts extra stress on your back and makes it difficult to maintain good posture. °SEEK MEDICAL CARE IF: °1. You have pain that is not relieved with rest or medicine. °2. You have increasing pain going down into the legs or buttocks. °3. You have pain that does not improve in one week. °4. You have night pain. °5. You lose weight. °6. You have a fever or chills. °SEEK IMMEDIATE MEDICAL CARE IF:  °1. You develop new bowel or bladder control problems. °2. You have unusual weakness or numbness in your arms or legs. °3. You develop nausea or vomiting. °4. You develop abdominal pain. °5. You feel faint. °  °This information is not intended to replace advice given to you by your health care provider. Make sure you discuss any questions you have with your health care provider. °  °Document Released: 11/08/2005 Document Revised: 11/29/2014 Document Reviewed: 03/12/2014 °Elsevier Interactive Patient Education ©2016 [employer] ° °Back Exercises °The following exercises strengthen the muscles that help to support the back. They also help to keep the lower back flexible. Doing these exercises can help to prevent back pain or lessen existing pain. °If you have back pain or discomfort, try doing these exercises 2-3 times each day or as told by your health care provider. When the pain goes away, do them once each day, but increase the number of times that you repeat the  steps for each exercise (do more repetitions). If you do not have back pain or discomfort, do these exercises once each day or as told by your health care provider. °EXERCISES °Single Knee to Chest °Repeat these steps 3-5 times for each leg: °5. Lie on your back on a firm bed or the floor with your legs extended. °6. Bring one knee to your chest. Your other leg should stay extended and in contact with the floor. °7. Hold your knee in place by grabbing your knee or thigh. °8. Pull on your knee until you feel a gentle stretch in your lower back. °9. Hold the stretch for 10-30 seconds. °10. Slowly release and straighten your leg. °Pelvic Tilt °Repeat these steps 5-10 times: °10. Lie on your back on a firm bed or the floor with your legs extended. °11. Bend your knees so they are pointing toward the ceiling and your feet are flat on the floor. °12. Tighten your lower abdominal muscles to press your lower back against the floor. This motion will tilt your pelvis so your tailbone points up toward the ceiling instead of pointing to your feet or the floor. °13. With gentle tension and even breathing, hold this position for 5-10 seconds. °Cat-Cow °Repeat these steps until your lower back becomes more flexible: °7. Get into a hands-and-knees position on a firm surface. Keep your hands under your shoulders, and keep your knees under your hips. You may place padding under your knees for comfort. °8. Let your head hang down, and point your tailbone toward the floor so your lower back becomes rounded like the back of a cat. °9. Hold this position for 5 seconds. °10. Slowly lift your head and point your tailbone up toward the ceiling so your back forms a sagging arch like the back of a cow. °11. Hold this position for 5 seconds. °Press-Ups °Repeat these steps 5-10 times: °6. Lie on your abdomen (face-down) on the floor. °7. Place your palms near your head, about shoulder-width apart. °8. While you keep your back as relaxed as  possible and keep your hips on the floor, slowly straighten your arms to raise the top half of your body and lift your shoulders. Do not use your back muscles to raise your upper torso. You may adjust the placement of your hands to make yourself more comfortable. °9. Hold this position for 5 seconds while you keep your back relaxed. °10. Slowly return to lying flat on the floor. °Bridges °Repeat these steps 10 times: °1. Lie on your back on a firm surface. °2. Bend your knees so they are pointing toward the ceiling and your feet are flat on the floor. °3. Tighten your buttocks muscles and lift your buttocks off of the floor until your waist is at almost the same height as your knees. You should feel the muscles working in your buttocks and the back of your thighs. If you do not feel these muscles, slide your feet 1-2 inches farther away from your buttocks. °4. Hold this   position for 3-5 seconds. °5. Slowly lower your hips to the starting position, and allow your buttocks muscles to relax completely. °If this exercise is too easy, try doing it with your arms crossed over your chest. °Abdominal Crunches °Repeat these steps 5-10 times: °1. Lie on your back on a firm bed or the floor with your legs extended. °2. Bend your knees so they are pointing toward the ceiling and your feet are flat on the floor. °3. Cross your arms over your chest. °4. Tip your chin slightly toward your chest without bending your neck. °5. Tighten your abdominal muscles and slowly raise your trunk (torso) high enough to lift your shoulder blades a tiny bit off of the floor. Avoid raising your torso higher than that, because it can put too much stress on your low back and it does not help to strengthen your abdominal muscles. °6. Slowly return to your starting position. °Back Lifts °Repeat these steps 5-10 times: °1. Lie on your abdomen (face-down) with your arms at your sides, and rest your forehead on the floor. °2. Tighten the muscles in your  legs and your buttocks. °3. Slowly lift your chest off of the floor while you keep your hips pressed to the floor. Keep the back of your head in line with the curve in your back. Your eyes should be looking at the floor. °4. Hold this position for 3-5 seconds. °5. Slowly return to your starting position. °SEEK MEDICAL CARE IF: °· Your back pain or discomfort gets much worse when you do an exercise. °· Your back pain or discomfort does not lessen within 2 hours after you exercise. °If you have any of these problems, stop doing these exercises right away. Do not do them again unless your health care provider says that you can. °SEEK IMMEDIATE MEDICAL CARE IF: °· You develop sudden, severe back pain. If this happens, stop doing the exercises right away. Do not do them again unless your health care provider says that you can. °  °This information is not intended to replace advice given to you by your health care provider. Make sure you discuss any questions you have with your health care provider. °  °Document Released: 12/16/2004 Document Revised: 07/30/2015 Document Reviewed: 01/02/2015 °Elsevier Interactive Patient Education ©2016 [employer] ° °

## 2016-01-16 DIAGNOSIS — M791 Myalgia, unspecified site: Secondary | ICD-10-CM | POA: Insufficient documentation

## 2016-01-16 DIAGNOSIS — R197 Diarrhea, unspecified: Secondary | ICD-10-CM | POA: Insufficient documentation

## 2016-01-16 DIAGNOSIS — J309 Allergic rhinitis, unspecified: Secondary | ICD-10-CM | POA: Insufficient documentation

## 2016-01-16 DIAGNOSIS — K5900 Constipation, unspecified: Secondary | ICD-10-CM | POA: Insufficient documentation

## 2016-01-16 DIAGNOSIS — J349 Unspecified disorder of nose and nasal sinuses: Secondary | ICD-10-CM | POA: Insufficient documentation

## 2016-01-16 DIAGNOSIS — K589 Irritable bowel syndrome without diarrhea: Secondary | ICD-10-CM | POA: Insufficient documentation

## 2016-01-16 DIAGNOSIS — L813 Cafe au lait spots: Secondary | ICD-10-CM | POA: Insufficient documentation

## 2016-01-16 DIAGNOSIS — M2550 Pain in unspecified joint: Secondary | ICD-10-CM | POA: Insufficient documentation

## 2016-01-16 DIAGNOSIS — Z8742 Personal history of other diseases of the female genital tract: Secondary | ICD-10-CM | POA: Insufficient documentation

## 2016-01-16 DIAGNOSIS — M533 Sacrococcygeal disorders, not elsewhere classified: Secondary | ICD-10-CM | POA: Insufficient documentation

## 2016-02-18 ENCOUNTER — Other Ambulatory Visit: Payer: Self-pay | Admitting: Neurology

## 2016-02-25 ENCOUNTER — Other Ambulatory Visit: Payer: Self-pay

## 2016-02-25 DIAGNOSIS — Z1231 Encounter for screening mammogram for malignant neoplasm of breast: Secondary | ICD-10-CM

## 2016-02-26 ENCOUNTER — Encounter: Payer: Self-pay | Admitting: Neurology

## 2016-02-26 ENCOUNTER — Ambulatory Visit (INDEPENDENT_AMBULATORY_CARE_PROVIDER_SITE_OTHER): Payer: BC Managed Care – PPO | Admitting: Neurology

## 2016-02-26 VITALS — BP 112/69 | HR 73 | Ht 65.0 in | Wt 263.5 lb

## 2016-02-26 DIAGNOSIS — G2581 Restless legs syndrome: Secondary | ICD-10-CM

## 2016-02-26 DIAGNOSIS — Z8669 Personal history of other diseases of the nervous system and sense organs: Secondary | ICD-10-CM | POA: Diagnosis not present

## 2016-02-26 DIAGNOSIS — M797 Fibromyalgia: Secondary | ICD-10-CM | POA: Diagnosis not present

## 2016-02-26 DIAGNOSIS — G4733 Obstructive sleep apnea (adult) (pediatric): Secondary | ICD-10-CM | POA: Diagnosis not present

## 2016-02-26 DIAGNOSIS — G4761 Periodic limb movement disorder: Secondary | ICD-10-CM

## 2016-02-26 MED ORDER — MODAFINIL 200 MG PO TABS
200.0000 mg | ORAL_TABLET | Freq: Every day | ORAL | Status: DC
Start: 2016-02-26 — End: 2016-05-12

## 2016-02-26 NOTE — Progress Notes (Signed)
Reason for visit: Sleep disturbance  Gloria [name] is an 45 y.o. female  History of present illness:  Gloria [name] is a 45 year old right-handed white female with a history of obesity, and a moderate level of sleep apnea. The patient has periodic limb movements throughout the night. She has been diagnosed with fibromyalgia as well, currently on gabapentin. She reports some ankle swelling on this medication. She also has some low back pain, she is followed through Dr. Nelva Bush for this. She is working, but she also takes classes in the evening. She feels somewhat rested when she first wakes up in the morning, but later in the day she has a lot of drowsiness, and has difficulty staying awake. She will oftentimes take a nap at lunch time so she can function later in the afternoon. She is in physical therapy for her low back issues currently. The patient takes Sinemet at night to help with some of the restless legs and the periodic limb movement symptoms.  Past Medical History  Diagnosis Date  . Smoker   . History of ETOH abuse   . Elevated LFTs   . Fibromyalgia   . Bulging disc     X 2  . Arthritis   . Hyperlipidemia   . Osteoarthritis     lower back  . Obese   . Periodic limb movement disorder 02/25/2014  . Anemia   . Allergy   . Anxiety   . Depression   . GERD (gastroesophageal reflux disease)   . Substance abuse   . Obstructive sleep apnea 02/25/2015  . Chronic back pain 02/25/2015    Past Surgical History  Procedure Laterality Date  . Intrauterine device insertion  08/09/2008    PARAGUARD  . Neck surgery    . Kidney surgery  1995    abcess  . Radial tunnel Bilateral 2005  . Vaginal hysterectomy Left 06/06/2013    Procedure: Vaginal Hysterectomy with Patiial Right Salpingectomy;  Surgeon: Terrance Mass, MD;  Location: [facility];  Service: Gynecology;  Laterality: Left;  . Laparoscopy N/A 06/06/2013    Procedure: LAPAROSCOPY DIAGNOSTIC;  Surgeon: Terrance Mass, MD;  Location:  [facility];  Service: Gynecology;  Laterality: N/A;  . Laparotomy  06/06/2013    Procedure: EXPLORATORY LAPAROTOMY;  Surgeon: Terrance Mass, MD;  Location: [facility];  Service: Gynecology;;  . Cystoscopy  06/06/2013    Procedure: CYSTOSCOPY;  Surgeon: Terrance Mass, MD;  Location: [facility];  Service: Gynecology;;    Family History  Problem Relation Age of Onset  . Breast cancer Mother   . Breast cancer Maternal Aunt   . Breast cancer Paternal Grandmother     COLON  . Cancer Paternal Grandfather     THROAT .[name]    Social history:  reports that she quit smoking about 4 years ago. She has never used smokeless tobacco. She reports that she does not drink alcohol or use illicit drugs.    Allergies  Allergen Reactions  . Benadryl [Diphenhydramine Hcl] Hives  . Requip [Ropinirole Hcl]     Increased headache    Medications:  Prior to Admission medications   Medication Sig Start Date End Date Taking? Authorizing Provider  B Complex Vitamins (B COMPLEX PO) Take 1 tablet by mouth daily.   Yes Historical Provider, MD  Calcium Carbonate-Vitamin D (CALCIUM 600/VITAMIN D) 600-400 MG-UNIT per tablet Take 1 tablet by mouth 2 (two) times daily.    Yes Historical Provider, MD  Carbidopa-Levodopa ER (SINEMET CR)  25-100 MG tablet controlled release TAKE 1 TABLET BY MOUTH AT BEDTIME. 02/19/16  Yes Kathrynn Ducking, MD  cetirizine (ZYRTEC) 10 MG tablet Take 10 mg by mouth every other day.    Yes Historical Provider, MD  Coenzyme Q10 (COQ10) 100 MG CAPS Take 100 mg by mouth daily.   Yes Historical Provider, MD  esomeprazole (NEXIUM) 40 MG capsule Take 40 mg by mouth daily before breakfast.   Yes Historical Provider, MD  gabapentin (NEURONTIN) 800 MG tablet Take 800 mg by mouth 3 (three) times daily.    Yes Historical Provider, MD  LORazepam (ATIVAN) 1 MG tablet Take 1 mg by mouth at bedtime.    Yes Historical Provider, MD  magnesium gluconate (MAGONATE) 500 MG tablet Take 1,000 mg by mouth 3 (three) times  daily.    Yes Historical Provider, MD  Multiple Vitamins-Minerals (MULTIVITAMIN PO) Take 1 tablet by mouth daily.    Yes Historical Provider, MD  OMEGA 3-6-9 FATTY ACIDS PO Take 500 mg by mouth daily.   Yes Historical Provider, MD  OXcarbazepine ER (OXTELLAR XR) 600 MG TB24 Take 1 tablet by mouth daily.   Yes Historical Provider, MD  PROAIR HFA 108 (90 BASE) MCG/ACT inhaler Inhale 1 puff into the lungs every 4 (four) hours as needed for wheezing or shortness of breath.  08/23/13  Yes Historical Provider, MD  REXULTI 3 MG TABS Take 1 tablet by mouth daily. 11/04/15  Yes Historical Provider, MD  rizatriptan (MAXALT) 10 MG tablet Take 10 mg by mouth as needed for migraine. May repeat in 2 hours if needed   Yes Historical Provider, MD  thiamine (VITAMIN B-1) 50 MG tablet Take 50 mg by mouth daily.   Yes Historical Provider, MD  vitamin C (ASCORBIC ACID) 500 MG tablet Take 500 mg by mouth daily.   Yes Historical Provider, MD    ROS:  Out of a complete 14 system review of symptoms, the patient complains only of the following symptoms, and all other reviewed systems are negative.  Activity change, decreased appetite Double vision, blurred vision Restless legs, daytime sleepiness Environmental allergies Back pain, neck pain, neck stiffness Headache  Blood pressure 112/69, pulse 73, height 5' 5"  (1.651 m), weight 263 lb 8 oz (119.523 kg), last menstrual period 04/25/2013.  Physical Exam  General: The patient is alert and cooperative at the time of the examination. The patient is markedly obese.  Skin: 1+ edema to ankles is noted bilaterally.   Neurologic Exam  Mental status: The patient is alert and oriented x 3 at the time of the examination. The patient has apparent normal recent and remote memory, with an apparently normal attention span and concentration ability.   Cranial nerves: Facial symmetry is present. Speech is normal, no aphasia or dysarthria is noted. Extraocular movements are  full. Visual fields are full.  Motor: The patient has good strength in all 4 extremities.  Sensory examination: Soft touch sensation is symmetric on the face, arms, and legs.  Coordination: The patient has good finger-nose-finger and heel-to-shin bilaterally.  Gait and station: The patient has a normal gait. Tandem gait is normal. Romberg is negative. No drift is seen.  Reflexes: Deep tendon reflexes are symmetric.   Assessment/Plan:  1. Moderate sleep apnea  2. Restless leg syndrome, periodic limb movement disorder  3. Migraine headache  4. Fibromyalgia  The patient continues to have a lot of fatigue and excessive daytime drowsiness. The patient may benefit from the use of Provigil to help her  function better during the working days. She will continue the Sinemet for now, I have written a prescription for the Provigil. She will follow-up in 6 months, sooner if needed.  Jill Alexanders MD 02/26/2016 7:49 PM  [facility] [address] [place], [place] [address]  Phone [phone] Fax [phone]

## 2016-03-03 ENCOUNTER — Encounter: Payer: Self-pay | Admitting: *Deleted

## 2016-03-03 DIAGNOSIS — R600 Localized edema: Secondary | ICD-10-CM | POA: Insufficient documentation

## 2016-03-15 ENCOUNTER — Encounter: Payer: Self-pay | Admitting: Neurology

## 2016-03-22 ENCOUNTER — Ambulatory Visit
Admission: RE | Admit: 2016-03-22 | Discharge: 2016-03-22 | Disposition: A | Discharge status: Home / Self Care | Payer: BC Managed Care – PPO | Source: Ambulatory Visit

## 2016-03-22 DIAGNOSIS — Z1231 Encounter for screening mammogram for malignant neoplasm of breast: Secondary | ICD-10-CM

## 2016-03-30 DIAGNOSIS — G43909 Migraine, unspecified, not intractable, without status migrainosus: Secondary | ICD-10-CM | POA: Insufficient documentation

## 2016-05-12 ENCOUNTER — Other Ambulatory Visit: Payer: Self-pay | Admitting: Neurology

## 2016-05-12 ENCOUNTER — Encounter: Payer: Self-pay | Admitting: Neurology

## 2016-05-12 MED ORDER — ARMODAFINIL 150 MG PO TABS: 150.0000 mg | ORAL_TABLET | Freq: Every day | ORAL | Status: DC

## 2016-05-13 DIAGNOSIS — R7303 Prediabetes: Secondary | ICD-10-CM | POA: Insufficient documentation

## 2016-05-17 ENCOUNTER — Telehealth: Payer: Self-pay

## 2016-05-17 NOTE — Telephone Encounter (Signed)
Started PA today for Armodafinil on Cover My [employer]. Key: [id number]

## 2016-05-18 NOTE — Telephone Encounter (Signed)
Clarification form received from [facility]. Form filled out and put in Dr. Jannifer Franklin' office for signature.

## 2016-05-19 NOTE — Telephone Encounter (Signed)
Received notice of approval from [facility] from 05/19/16 to 05/19/17. I faxed copy to patient's pharmacy.

## 2016-05-19 NOTE — Telephone Encounter (Signed)
Form signed and faxed in.

## 2016-06-03 DIAGNOSIS — R110 Nausea: Secondary | ICD-10-CM | POA: Insufficient documentation

## 2016-07-22 ENCOUNTER — Other Ambulatory Visit: Payer: Self-pay | Admitting: Neurology

## 2016-08-26 ENCOUNTER — Ambulatory Visit (INDEPENDENT_AMBULATORY_CARE_PROVIDER_SITE_OTHER): Payer: 59 | Admitting: Adult Health

## 2016-08-26 ENCOUNTER — Encounter: Payer: Self-pay | Admitting: Adult Health

## 2016-08-26 VITALS — BP 128/86 | HR 88 | Ht 65.0 in | Wt 260.8 lb

## 2016-08-26 DIAGNOSIS — G4761 Periodic limb movement disorder: Secondary | ICD-10-CM

## 2016-08-26 DIAGNOSIS — G4719 Other hypersomnia: Secondary | ICD-10-CM | POA: Diagnosis not present

## 2016-08-26 DIAGNOSIS — M797 Fibromyalgia: Secondary | ICD-10-CM | POA: Diagnosis not present

## 2016-08-26 DIAGNOSIS — R5383 Other fatigue: Secondary | ICD-10-CM

## 2016-08-26 NOTE — Progress Notes (Signed)
I have read the note, and I agree with the clinical assessment and plan.  WILLIS,CHARLES KEITH   

## 2016-08-26 NOTE — Progress Notes (Signed)
PATIENT: Aubrei Petsch DOB: 1970-12-04  REASON FOR VISIT: follow up- PLM, daytime sleepiness, fibromyalgia HISTORY FROM: patient  HISTORY OF PRESENT ILLNESS: Ms. Schuler is a 45 year old female with a history of obesity , periodic limb movements, daytime sleepiness and fibromyalgia. She returns today for follow-up. She is currently using Sinemet & gabapentin to control periodic leg movements. She reports that this is beneficial. She is also on Nuvigil 150 mg daily for daytime sleepiness. She reports that this has been working well for her. She states that she does not take her medication she has to take naps throughout the day. She could not tolerate higher dosages secondary to increased anxiety. She's had a sleep study in the past but it did not show sleep apnea. She states that she is fatigued throughout the day however she is working a lot of hours at work and feels that this contributes to this. She denies any new neurological symptoms. She returns today for an evaluation.  HISTORY 02/26/16: Ms. Rabelo is a 45 year old right-handed white female with a history of obesity, and a moderate level of sleep apnea. The patient has periodic limb movements throughout the night. She has been diagnosed with fibromyalgia as well, currently on gabapentin. She reports some ankle swelling on this medication. She also has some low back pain, she is followed through Dr. Nelva Bush for this. She is working, but she also takes classes in the evening. She feels somewhat rested when she first wakes up in the morning, but later in the day she has a lot of drowsiness, and has difficulty staying awake. She will oftentimes take a nap at lunch time so she can function later in the afternoon. She is in physical therapy for her low back issues currently. The patient takes Sinemet at night to help with some of the restless legs and the periodic limb movement symptoms.  REVIEW OF SYSTEMS: Out of a complete 14 system review of  symptoms, the patient complains only of the following symptoms, and all other reviewed systems are negative.  Epworth sleepiness score 16 fatigue severity score 46 Eye redness, shortness of breath, leg swelling, abdominal pain, restless leg, frequent waking  ALLERGIES: Allergies  Allergen Reactions  . Benadryl [Diphenhydramine Hcl] Hives  . Requip [Ropinirole Hcl]     Increased headache    HOME MEDICATIONS: Outpatient Medications Prior to Visit  Medication Sig Dispense Refill  . Armodafinil (NUVIGIL) 150 MG tablet Take 1 tablet (150 mg total) by mouth daily. 30 tablet 3  . B Complex Vitamins (B COMPLEX PO) Take 1 tablet by mouth daily.    . Calcium Carbonate-Vitamin D (CALCIUM 600/VITAMIN D) 600-400 MG-UNIT per tablet Take 1 tablet by mouth 2 (two) times daily.     . Carbidopa-Levodopa ER (SINEMET CR) 25-100 MG tablet controlled release TAKE 1 TABLET BY MOUTH AT BEDTIME. 90 tablet 1  . cetirizine (ZYRTEC) 10 MG tablet Take 10 mg by mouth every other day.     . Coenzyme Q10 (COQ10) 100 MG CAPS Take 100 mg by mouth daily.    [name] esomeprazole (NEXIUM) 40 MG capsule Take 40 mg by mouth daily before breakfast.    . gabapentin (NEURONTIN) 800 MG tablet Take 800 mg by mouth 3 (three) times daily.     [name] LORazepam (ATIVAN) 1 MG tablet Take 1 mg by mouth at bedtime.     . magnesium gluconate (MAGONATE) 500 MG tablet Take 1,000 mg by mouth 3 (three) times daily.     . Multiple  Vitamins-Minerals (MULTIVITAMIN PO) Take 1 tablet by mouth daily.     . OMEGA 3-6-9 FATTY ACIDS PO Take 500 mg by mouth daily.    . OXcarbazepine ER (OXTELLAR XR) 600 MG TB24 Take 1 tablet by mouth daily.    [name] PROAIR HFA 108 (90 BASE) MCG/ACT inhaler Inhale 1 puff into the lungs every 4 (four) hours as needed for wheezing or shortness of breath.     [name] REXULTI 3 MG TABS Take 1 tablet by mouth daily.  1  . rizatriptan (MAXALT) 10 MG tablet Take 10 mg by mouth as needed for migraine. May repeat in 2 hours if needed    .  thiamine (VITAMIN B-1) 50 MG tablet Take 50 mg by mouth daily.    . vitamin C (ASCORBIC ACID) 500 MG tablet Take 500 mg by mouth daily.     No facility-administered medications prior to visit.     PAST MEDICAL HISTORY: Past Medical History:  Diagnosis Date  . Allergy   . Anemia   . Anxiety   . Arthritis   . Bulging disc    X 2  . Chronic back pain 02/25/2015  . Depression   . Elevated LFTs   . Fibromyalgia   . GERD (gastroesophageal reflux disease)   . History of ETOH abuse   . Hyperlipidemia   . Obese   . Obstructive sleep apnea 02/25/2015  . Osteoarthritis    lower back  . Periodic limb movement disorder 02/25/2014  . Smoker   . Substance abuse     PAST SURGICAL HISTORY: Past Surgical History:  Procedure Laterality Date  . CYSTOSCOPY  06/06/2013   Procedure: CYSTOSCOPY;  Surgeon: Terrance Mass, MD;  Location: [facility];  Service: Gynecology;;  . INTRAUTERINE DEVICE INSERTION  08/09/2008   PARAGUARD  . kidney surgery  1995   abcess  . LAPAROSCOPY N/A 06/06/2013   Procedure: LAPAROSCOPY DIAGNOSTIC;  Surgeon: Terrance Mass, MD;  Location: [facility];  Service: Gynecology;  Laterality: N/A;  . LAPAROTOMY  06/06/2013   Procedure: EXPLORATORY LAPAROTOMY;  Surgeon: Terrance Mass, MD;  Location: [facility];  Service: Gynecology;;  . NECK SURGERY    . radial tunnel Bilateral 2005  . VAGINAL HYSTERECTOMY Left 06/06/2013   Procedure: Vaginal Hysterectomy with Patiial Right Salpingectomy;  Surgeon: Terrance Mass, MD;  Location: [facility];  Service: Gynecology;  Laterality: Left;    FAMILY HISTORY: Family History  Problem Relation Age of Onset  . Breast cancer Mother   . Breast cancer Maternal Aunt   . Breast cancer Paternal Grandmother     COLON  . Cancer Paternal Grandfather     THROAT .[name]    SOCIAL HISTORY: Social History   Social History  . Marital status: Legally Separated    Spouse name: N/A  . Number of children: 1  . Years of education: 73   Occupational History    .  [place] History Main Topics  . Smoking status: Former Smoker    Packs/day: 1.00    Quit date: 01/26/2012  . Smokeless tobacco: Never Used  . Alcohol use No     Comment: History of alcohol abuse  . Drug use: No  . Sexual activity: Yes   Other Topics Concern  . Not on file   Social History Narrative   Patient lives at home and works full time.   Education some college.   Right handed.   Caffeine: 2 cups daily.  PHYSICAL EXAM  Vitals:   08/26/16 0828  BP: 128/86  Pulse: 88  Weight: 260 lb 12.8 oz (118.3 kg)  Height: 5' 5"  (1.651 m)   Body mass index is 43.4 kg/m.  Generalized: Well developed, in no acute distress   Neurological examination  Mentation: Alert oriented to time, place, history taking. Follows all commands speech and language fluent Cranial nerve II-XII: Pupils were equal round reactive to light. Extraocular movements were full, visual field were full on confrontational test. Facial sensation and strength were normal. Uvula tongue midline. Head turning and shoulder shrug  were normal and symmetric. Motor: The motor testing reveals 5 over 5 strength of all 4 extremities. Good symmetric motor tone is noted throughout.  Sensory: Sensory testing is intact to soft touch on all 4 extremities. No evidence of extinction is noted.  Coordination: Cerebellar testing reveals good finger-nose-finger and heel-to-shin bilaterally.  Gait and station: Gait is normal. Tandem gait is normal. Romberg is negative. No drift is seen.  Reflexes: Deep tendon reflexes are symmetric and normal bilaterally.   DIAGNOSTIC DATA (LABS, IMAGING, TESTING) - I reviewed patient records, labs, notes, testing and imaging myself where available.    ASSESSMENT AND PLAN 45 y.o. year old female  has a past medical history of Allergy; Anemia; Anxiety; Arthritis; Bulging disc; Chronic back pain (02/25/2015); Depression; Elevated LFTs; Fibromyalgia; GERD  (gastroesophageal reflux disease); History of ETOH abuse; Hyperlipidemia; Obese; Obstructive sleep apnea (02/25/2015); Osteoarthritis; Periodic limb movement disorder (02/25/2014); Smoker; and Substance abuse. here with:  1. Periodic limb movement disorder 2. Excessive daytime sleepiness 3. Fibromyalgia  Overall the patient is doing well. She will continue on Nuvigil 150 mg daily for sleepiness. She will continue using Sinemet at bedtime for limb movements. Her current primary care is retiring. She is in the process of finding a new primary care. She is asked that we prescribed gabapentin in the meantime. I am amenable to this plan. Patient advised that if her symptoms worsen or she develops any new symptoms she should let us know. Follow-up in 6 months with Dr. Jannifer Franklin.     Ward Givens, MSN, NP-C 08/26/2016, 8:27 AM [facility] [address] [place], [place] [address] [phone]

## 2016-08-26 NOTE — Patient Instructions (Addendum)
Continue Nuvigil 150 mg daily Continue gabapetin  Continue Sinemet If your symptoms worsen or you develop new symptoms please let us know.

## 2016-08-27 ENCOUNTER — Other Ambulatory Visit: Payer: Self-pay | Admitting: Neurology

## 2016-08-30 ENCOUNTER — Telehealth: Payer: Self-pay | Admitting: Neurology

## 2016-08-30 MED ORDER — CARBIDOPA-LEVODOPA ER 25-100 MG PO TBCR: 1.0000 | EXTENDED_RELEASE_TABLET | Freq: Every day | ORAL | 3 refills | Status: DC

## 2016-08-30 NOTE — Telephone Encounter (Signed)
Requested refills e-scribed to pt's pharmacy.

## 2016-08-30 NOTE — Telephone Encounter (Signed)
Patient requesting refill of Carbidopa-Levodopa ER (SINEMET CR) 25-100 MG tablet controlled release Pharmacy:[id number] [place]  - [address]  Pt is out of medication now.

## 2016-09-02 ENCOUNTER — Encounter: Payer: Self-pay | Admitting: Gynecology

## 2016-09-02 ENCOUNTER — Telehealth: Payer: Self-pay | Admitting: *Deleted

## 2016-09-02 ENCOUNTER — Ambulatory Visit (INDEPENDENT_AMBULATORY_CARE_PROVIDER_SITE_OTHER): Payer: 59 | Admitting: Gynecology

## 2016-09-02 VITALS — BP 130/86 | Ht 65.0 in | Wt 263.0 lb

## 2016-09-02 DIAGNOSIS — Z86010 Personal history of colonic polyps: Secondary | ICD-10-CM

## 2016-09-02 DIAGNOSIS — Z01411 Encounter for gynecological examination (general) (routine) with abnormal findings: Secondary | ICD-10-CM

## 2016-09-02 DIAGNOSIS — R635 Abnormal weight gain: Secondary | ICD-10-CM

## 2016-09-02 NOTE — Patient Instructions (Signed)
Bariatric Surgery Information Bariatric surgery, also called weight loss surgery, is a procedure that helps you lose weight. You may consider or your health care provider may suggest bariatric surgery if:  You are severely obese and have been unable to lose weight through diet and exercise.  You have health problems related to obesity, such as:  Type 2 diabetes.  Heart disease.  Lung disease. HOW DOES BARIATRIC SURGERY HELP ME LOSE WEIGHT?  Bariatric surgery helps you lose weight by decreasing how much food your body absorbs. This is done by closing off part of your stomach to make it smaller. This restricts the amount of food your stomach can hold. Bariatric surgery can also change your body's regular digestive process, so that food bypasses the parts of your body that absorb calories and nutrients.  If you decide to have bariatric surgery, it is important to continue to eat a healthy diet and exercise regularly after the surgery.  WHAT ARE THE DIFFERENT KINDS OF BARIATRIC SURGERY?  There are two kinds of bariatric surgeries:  Restrictive surgeries make your stomach smaller. They do not change your digestive process. The smaller the size of your new stomach, the less food you can eat. There are different types of restrictive surgeries.  Malabsorptive surgeries both make your stomach smaller and alter your digestive process so that your body processes less calories and nutrients. These are the most common kind of bariatric surgery. There are different types of malabsorptive surgeries. WHAT ARE THE DIFFERENT TYPES OF RESTRICTIVE SURGERY? Adjustable Gastric Banding In this procedure, an inflatable band is placed around your stomach near the upper end. This makes the passageway for food into the rest of your stomach much smaller. The band can be adjusted, making it tighter or looser, by filling it with salt solution. Your surgeon can adjust the band based on how are you feeling and how much  weight you are losing. The band can be removed in the future.  Vertical Banded Gastroplasty In this procedure, staples are used to separate your stomach into two parts, a small upper pouch and a bigger lower pouch. This decreases how much food you can eat. Sleeve Gastrectomy In this procedure, your stomach is made smaller. This is done by surgically removing a large part of your stomach. When your stomach is smaller, you feel full more quickly and reduce how much you eat. WHAT ARE THE DIFFERENT TYPES OF MALABSORPTIVE SURGERY? Roux-en-Y Gastric Bypass (RGB) This is the most common weight loss surgery. In this procedure, a small stomach pouch is created in the upper part of your stomach. Next, this small stomach pouch is attached directly to the middle part of your small intestine. The farther down your small intestine the new connection is made, the fewer calories and nutrients you will absorb.  Biliopancreatic Diversion with Duodenal Switch (BPD/DS)  This is a multi-step procedure. In this procedure, a large part of your stomach is removed, making your stomach smaller. Next, this smaller stomach is attached to the lower part of your small intestine. Like the RGB surgery, you absorb fewer calories and nutrients the farther down your small intestine the attachment is made.   WHAT ARE THE RISKS OF BARIATRIC SURGERY? As with any surgical procedure, each type of bariatric surgery has its own risks. These risks also depend on your age, your overall health, and any other medical conditions you may have. When deciding on bariatric surgery, it is very important to:   Talk to your health care provider  and choose the surgery that is best for you.  Ask your health care provider about specific risks for the surgery you choose. FOR MORE INFORMATION  American Society for Metabolic & Bariatric Surgery: www.asmbs.org  Weight-control Information Network (WIN): win.AmenCredit.is   This information is not  intended to replace advice given to you by your health care provider. Make sure you discuss any questions you have with your health care provider.   Document Released: 11/08/2005 Document Revised: 07/30/2015 Document Reviewed: 05/09/2013 Elsevier Interactive Patient Education [employer].

## 2016-09-02 NOTE — Telephone Encounter (Signed)
Referral placed they will contact pt to schedule. 

## 2016-09-02 NOTE — Progress Notes (Signed)
Gloria [name] 22-Aug-1971 [id number]   History:    45 y.o.  for annual gyn exam with complaint of weight gain. Patient had been followed by her PCP Dr.Barrett but has not seen him in a while and has not had any blood work. She did state that her psychiatrist had checked her blood sugar and told that she was diabetic and she is on metformin daily. She had been on multiple medications for depression the past. Patient with past history of alcohol abuse and past history of a liver function tests and past smoker has been dry for over 5 years now. Patient in 2015 had a transvaginal hysterectomy with partial right salpingectomy along with diagnostic laparoscopy and laparotomy with cystoscopy secondary to menorrhagia, anemia, fibroid uterus and intraoperative hemorrhage. Her pathology report was as follows:  Uterus and cervix, with right fallopian tube  - CERVIX: UNREMARKABLE.  - ENDOMETRIUM: PROLIFERATIVE WITH INTRAUTERINE DEVICE.  - MYOMETRIUM: LEIOMYOMA.  - UTERINE SEROSA: UNREMARKABLE. NO ENDOMETRIOSIS OR MALIGNANCY.  - RIGHT FALLOPIAN TUBE: UNREMARKABLE. NO ENDOMETRIOSIS OR MALIGNANCY.   Patient done well since her surgery. Patient prior to her hysterectomy has had normal Pap smears.  Past medical history,surgical history, family history and social history were all reviewed and documented in the EPIC chart.  Gynecologic History Patient's last menstrual period was 04/25/2013. Contraception: status post hysterectomy Last Pap: 2011. Results were: normal Last mammogram: 2017. Results were: normal  Obstetric History OB History  Gravida Para Term Preterm AB Living  1 1 1     1   SAB TAB Ectopic Multiple Live Births          1    # Outcome Date GA Lbr Len/2nd Weight Sex Delivery Anes PTL Lv  1 Term     M Vag-Spont  N LIV       ROS: A ROS was performed and pertinent positives and negatives are included in the history.  GENERAL: No fevers or chills. HEENT: No change in vision, no  earache, sore throat or sinus congestion. NECK: No pain or stiffness. CARDIOVASCULAR: No chest pain or pressure. No palpitations. PULMONARY: No shortness of breath, cough or wheeze. GASTROINTESTINAL: No abdominal pain, nausea, vomiting or diarrhea, melena or bright red blood per rectum. GENITOURINARY: No urinary frequency, urgency, hesitancy or dysuria. MUSCULOSKELETAL: No joint or muscle pain, no back pain, no recent trauma. DERMATOLOGIC: No rash, no itching, no lesions. ENDOCRINE: No polyuria, polydipsia, no heat or cold intolerance. No recent change in weight. HEMATOLOGICAL: No anemia or easy bruising or bleeding. NEUROLOGIC: No headache, seizures, numbness, tingling or weakness. PSYCHIATRIC: No depression, no loss of interest in normal activity or change in sleep pattern.     Exam: chaperone present  BP 130/86   Ht 5' 5"  (1.651 m)   Wt 263 lb (119.3 kg)   LMP 04/25/2013   BMI 43.77 kg/m   Body mass index is 43.77 kg/m.  General appearance : Well developed well nourished female. No acute distress HEENT: Eyes: no retinal hemorrhage or exudates,  Neck supple, trachea midline, no carotid bruits, no thyroidmegaly Lungs: Clear to auscultation, no rhonchi or wheezes, or rib retractions  Heart: Regular rate and rhythm, no murmurs or gallops Breast:Examined in sitting and supine position were symmetrical in appearance, no palpable masses or tenderness,  no skin retraction, no nipple inversion, no nipple discharge, no skin discoloration, no axillary or supraclavicular lymphadenopathy Abdomen: no palpable masses or tenderness, no rebound or guarding Extremities: no edema or skin discoloration or tenderness  Pelvic:  Bartholin, Urethra, Skene Glands: Within normal limits             Vagina: No gross lesions or discharge  Cervix: Absent  Uterus  absent  Adnexa  Without masses or tenderness  Anus and perineum  normal   Rectovaginal  normal sphincter tone without palpated masses or tenderness              Hemoccult not done     Assessment/Plan:  45 y.o. female for annual exam with past history of alcohol abuse and smoking has been dry according to her for 6 years. Patient with history of type 2 diabetes diagnosed by her psychiatrist? She is currently on metformin. She is complaining of weight gain and feeling bloated a lot. Patient will be referred to the gastroenterologist for further evaluation in the meantime she'll come to the office before that for the following screening blood work: Comprehensive metabolic panel, fasting lipid profile, TSH, CBC, and urinalysis. I'm also going to make recommendation that she be seen by the bariatric surgeon for consideration of surgery due to her massive weight gain of 21 pounds since last year and a current BMI 43.77 kg/m. Patient did receive her flu vaccine recently. Pap smear not indicated.   Terrance Mass MD, 12:44 PM 09/02/2016

## 2016-09-02 NOTE — Telephone Encounter (Signed)
-----   Message from Terrance Mass, MD sent at 09/02/2016 12:51 PM EDT ----- Anderson Malta, please make an appointment for this patient with Dr. Fuller Plan gastroenterologist at [facility] with history of colon polyps in the past as well as alcohol abuse she has not been dry for 6 years. Patient several years ago was a patient of Dr. Earlean Shawl

## 2016-09-03 ENCOUNTER — Other Ambulatory Visit: Payer: 59

## 2016-09-03 ENCOUNTER — Encounter: Payer: Self-pay | Admitting: Internal Medicine

## 2016-09-03 LAB — COMPREHENSIVE METABOLIC PANEL
ALBUMIN: 3.6 g/dL (ref 3.6–5.1)
ALK PHOS: 60 U/L (ref 33–115)
ALT: 16 U/L (ref 6–29)
AST: 18 U/L (ref 10–35)
BILIRUBIN TOTAL: 0.3 mg/dL (ref 0.2–1.2)
BUN: 7 mg/dL (ref 7–25)
CO2: 27 mmol/L (ref 20–31)
CREATININE: 0.58 mg/dL (ref 0.50–1.10)
Calcium: 8.8 mg/dL (ref 8.6–10.2)
Chloride: 98 mmol/L (ref 98–110)
Glucose, Bld: 171 mg/dL — ABNORMAL HIGH (ref 65–99)
Potassium: 3.7 mmol/L (ref 3.5–5.3)
Sodium: 136 mmol/L (ref 135–146)
TOTAL PROTEIN: 6.2 g/dL (ref 6.1–8.1)

## 2016-09-03 LAB — CBC WITH DIFFERENTIAL/PLATELET
BASOS ABS: 0 {cells}/uL (ref 0–200)
Basophils Relative: 0 %
EOS PCT: 2 %
Eosinophils Absolute: 184 cells/uL (ref 15–500)
HCT: 37.6 % (ref 35.0–45.0)
HEMOGLOBIN: 12.3 g/dL (ref 11.7–15.5)
LYMPHS ABS: 2392 {cells}/uL (ref 850–3900)
Lymphocytes Relative: 26 %
MCH: 28.2 pg (ref 27.0–33.0)
MCHC: 32.7 g/dL (ref 32.0–36.0)
MCV: 86.2 fL (ref 80.0–100.0)
MONO ABS: 460 {cells}/uL (ref 200–950)
MPV: 8.6 fL (ref 7.5–12.5)
Monocytes Relative: 5 %
NEUTROS ABS: 6164 {cells}/uL (ref 1500–7800)
Neutrophils Relative %: 67 %
Platelets: 266 10*3/uL (ref 140–400)
RBC: 4.36 MIL/uL (ref 3.80–5.10)
RDW: 15.2 % — ABNORMAL HIGH (ref 11.0–15.0)
WBC: 9.2 10*3/uL (ref 3.8–10.8)

## 2016-09-03 LAB — URINALYSIS W MICROSCOPIC + REFLEX CULTURE
Bacteria, UA: NONE SEEN [HPF]
Bilirubin Urine: NEGATIVE
Casts: NONE SEEN [LPF]
Crystals: NONE SEEN [HPF]
GLUCOSE, UA: NEGATIVE
HGB URINE DIPSTICK: NEGATIVE
KETONES UR: NEGATIVE
LEUKOCYTES UA: NEGATIVE
NITRITE: NEGATIVE
PH: 7 (ref 5.0–8.0)
Protein, ur: NEGATIVE
RBC / HPF: NONE SEEN RBC/HPF (ref <=2)
Specific Gravity, Urine: 1.016 (ref 1.001–1.035)
WBC, UA: NONE SEEN WBC/HPF (ref <=5)
YEAST: NONE SEEN [HPF]

## 2016-09-03 LAB — LIPID PANEL
CHOLESTEROL: 188 mg/dL (ref 125–200)
HDL: 29 mg/dL — ABNORMAL LOW (ref >=46)
LDL Cholesterol: 94 mg/dL (ref <130)
TRIGLYCERIDES: 325 mg/dL — AB (ref <150)
Total CHOL/HDL Ratio: 6.5 Ratio — ABNORMAL HIGH (ref <=5.0)
VLDL: 65 mg/dL — ABNORMAL HIGH (ref <30)

## 2016-09-03 LAB — TSH: TSH: 1.75 mIU/L

## 2016-09-06 NOTE — Telephone Encounter (Signed)
Appointment on 11/24/16

## 2016-09-14 ENCOUNTER — Other Ambulatory Visit: Payer: Self-pay | Admitting: Physician Assistant

## 2016-09-14 DIAGNOSIS — R1011 Right upper quadrant pain: Secondary | ICD-10-CM

## 2016-09-23 ENCOUNTER — Other Ambulatory Visit: Payer: BC Managed Care – PPO

## 2016-10-06 ENCOUNTER — Encounter: Payer: Self-pay | Admitting: *Deleted

## 2016-10-23 ENCOUNTER — Other Ambulatory Visit: Payer: Self-pay | Admitting: Neurology

## 2016-10-26 NOTE — Telephone Encounter (Signed)
Rx printed, awaiting signature.

## 2016-10-26 NOTE — Telephone Encounter (Signed)
Rx signed, faxed to pharmacy.

## 2016-10-29 NOTE — Telephone Encounter (Signed)
PA Case [id number] is Approved. For further questions, call [phone].

## 2016-11-24 ENCOUNTER — Ambulatory Visit (INDEPENDENT_AMBULATORY_CARE_PROVIDER_SITE_OTHER): Payer: 59 | Admitting: Internal Medicine

## 2016-11-24 ENCOUNTER — Other Ambulatory Visit (INDEPENDENT_AMBULATORY_CARE_PROVIDER_SITE_OTHER): Payer: 59

## 2016-11-24 ENCOUNTER — Encounter: Payer: Self-pay | Admitting: Internal Medicine

## 2016-11-24 VITALS — BP 130/88 | HR 100 | Ht 64.5 in | Wt 264.4 lb

## 2016-11-24 DIAGNOSIS — K529 Noninfective gastroenteritis and colitis, unspecified: Secondary | ICD-10-CM | POA: Diagnosis not present

## 2016-11-24 DIAGNOSIS — F1011 Alcohol abuse, in remission: Secondary | ICD-10-CM | POA: Diagnosis not present

## 2016-11-24 DIAGNOSIS — R1011 Right upper quadrant pain: Secondary | ICD-10-CM | POA: Diagnosis not present

## 2016-11-24 DIAGNOSIS — K219 Gastro-esophageal reflux disease without esophagitis: Secondary | ICD-10-CM | POA: Diagnosis not present

## 2016-11-24 DIAGNOSIS — Z87898 Personal history of other specified conditions: Secondary | ICD-10-CM | POA: Diagnosis not present

## 2016-11-24 DIAGNOSIS — Z86010 Personal history of colonic polyps: Secondary | ICD-10-CM

## 2016-11-24 LAB — CBC WITH DIFFERENTIAL/PLATELET
BASOS ABS: 0.1 10*3/uL (ref 0.0–0.1)
Basophils Relative: 0.7 % (ref 0.0–3.0)
EOS ABS: 0.2 10*3/uL (ref 0.0–0.7)
Eosinophils Relative: 1.2 % (ref 0.0–5.0)
HCT: 40.5 % (ref 36.0–46.0)
Hemoglobin: 13.6 g/dL (ref 12.0–15.0)
LYMPHS ABS: 3 10*3/uL (ref 0.7–4.0)
Lymphocytes Relative: 22.8 % (ref 12.0–46.0)
MCHC: 33.6 g/dL (ref 30.0–36.0)
MCV: 85.3 fl (ref 78.0–100.0)
Monocytes Absolute: 0.9 10*3/uL (ref 0.1–1.0)
Monocytes Relative: 6.4 % (ref 3.0–12.0)
NEUTROS ABS: 9.2 10*3/uL — AB (ref 1.4–7.7)
NEUTROS PCT: 68.9 % (ref 43.0–77.0)
PLATELETS: 361 10*3/uL (ref 150.0–400.0)
RBC: 4.75 Mil/uL (ref 3.87–5.11)
RDW: 15.1 % (ref 11.5–15.5)
WBC: 13.3 10*3/uL — ABNORMAL HIGH (ref 4.0–10.5)

## 2016-11-24 LAB — COMPREHENSIVE METABOLIC PANEL
ALT: 21 U/L (ref 0–35)
AST: 19 U/L (ref 0–37)
Albumin: 4.3 g/dL (ref 3.5–5.2)
Alkaline Phosphatase: 69 U/L (ref 39–117)
BUN: 13 mg/dL (ref 6–23)
CHLORIDE: 99 meq/L (ref 96–112)
CO2: 29 meq/L (ref 19–32)
Calcium: 9.1 mg/dL (ref 8.4–10.5)
Creatinine, Ser: 0.66 mg/dL (ref 0.40–1.20)
GFR: 102.1 mL/min (ref >60.00)
GLUCOSE: 122 mg/dL — AB (ref 70–99)
Potassium: 3.5 mEq/L (ref 3.5–5.1)
Sodium: 137 mEq/L (ref 135–145)
Total Bilirubin: 0.2 mg/dL (ref 0.2–1.2)
Total Protein: 7.3 g/dL (ref 6.0–8.3)

## 2016-11-24 LAB — LIPASE: Lipase: 19 U/L (ref 11.0–59.0)

## 2016-11-24 NOTE — Progress Notes (Signed)
Patient ID: Gloria [name], female   DOB: 25-Jul-1971, 46 y.o.   MRN: [id number] HPI: Gloria [name] is a 46 year old female with a past medical history of alcohol abuse in remission since 05/08/2008 with history of alcohol hepatitis, GERD, history of colon polyps, history of colonic diverticulosis, history of fibromyalgia, sleep apnea, periodic limb movement disorder and restless leg who is seen in consultation at the request of Dr. Juanita Craver to evaluate pain in the right upper quadrant, frequent diarrhea and acid reflux management. She has prior GI history with Dr. Earlean Shawl but states it is been many years since she has seen Dr. Earlean Shawl. She recalls 2 prior colonoscopies and reports polyps were removed but she is unaware of recommended recall interval, prior upper endoscopy.  Recently she has been dealing with periodic right upper quadrant sharp pain which is located in the right upper quadrant just below the costal margin. This is episodic and can be absent for as long as 2 weeks. Pain can be sharp and lasts minutes but can be stuttering and come and go over the course of the day when it is occurring. Not definitely related to eating but seems to be worse several hours after eating sweets. Can happen day or night. Seems non-positional. No definite alleviating factors. Feel sore after painful attacks. Movement doesn't make it worse. She is worried that this could be liver related pain. Not associated with nausea or vomiting. She takes pantoprazole 40 mg daily which was changed from Nexium 40 mg daily due to insurance coverage. Nexium worked great pantoprazole works usually very well though she occasionally has breakthrough heartburn. No dysphagia or odynophagia. No other abdominal pain. She reports stools as almost always loose. She will have a loose urgent bowel movements each morning's shortly after getting up followed quickly by another bowel movement. Then usually another bowel movement in the evening. Thus  2-3 loose bowel movements per day which can be urgent and at times explosive. Stools reported as nonbloody and non-melenic.  No alcohol use since June 2009. She did attend rehabilitation and is an active participant in Alcoholics Anonymous meetings.  Family history notable for paternal grandfather with esophageal and stomach cancer. No known family history of colorectal cancer  She does see neurology in take Sinemet CR, Provigil For anxiety and depression she takes Oxtellar and Rexulti For fibromyalgia she takes vitamin C, thiamine, magnesium gluconate 1000 mg 3 times a day  Of note she had a liver biopsy performed on 10/22/2008 which was shortly after she stopped drinking.  This was done for elevated LFTs and concern for alcoholic hepatitis. --Pathology results = mild chronic active hepatitis with increased fibrosis. There was evidence of patchy portal and periportal fibrosis and a rare focus of early bridging fibrosis.   Past Medical History:  Diagnosis Date  . Allergy   . Anemia   . Anxiety   . Arthritis   . Bulging disc    X 2  . Chronic active hepatitis ([place])   . Chronic back pain 02/25/2015  . Chronic headaches   . Depression   . Diverticulosis   . Elevated LFTs   . Fibromyalgia   . GERD (gastroesophageal reflux disease)   . History of ETOH abuse   . Hyperlipidemia   . Obese   . Obstructive sleep apnea 02/25/2015  . Osteoarthritis    lower back  . Periodic limb movement disorder 02/25/2014  . RLS (restless legs syndrome)   . Smoker   . Substance abuse  Past Surgical History:  Procedure Laterality Date  . CERVICAL FUSION    . CYSTOSCOPY  06/06/2013   Procedure: CYSTOSCOPY;  Surgeon: Terrance Mass, MD;  Location: [facility];  Service: Gynecology;;  . INTRAUTERINE DEVICE INSERTION  08/09/2008   PARAGUARD  . kidney surgery  1995   abcess  . LAPAROSCOPY N/A 06/06/2013   Procedure: LAPAROSCOPY DIAGNOSTIC;  Surgeon: Terrance Mass, MD;  Location: [facility];  Service:  Gynecology;  Laterality: N/A;  . LAPAROTOMY  06/06/2013   Procedure: EXPLORATORY LAPAROTOMY;  Surgeon: Terrance Mass, MD;  Location: [facility];  Service: Gynecology;;  . radial tunnel Bilateral 2005  . VAGINAL HYSTERECTOMY Left 06/06/2013   Procedure: Vaginal Hysterectomy with Patiial Right Salpingectomy;  Surgeon: Terrance Mass, MD;  Location: [facility];  Service: Gynecology;  Laterality: Left;    Outpatient Medications Prior to Visit  Medication Sig Dispense Refill  . B Complex Vitamins (B COMPLEX PO) Take 1 tablet by mouth daily.    . Calcium Carbonate-Vitamin D (CALCIUM 600/VITAMIN D) 600-400 MG-UNIT per tablet Take 1 tablet by mouth 2 (two) times daily.     . Carbidopa-Levodopa ER (SINEMET CR) 25-100 MG tablet controlled release Take 1 tablet by mouth at bedtime. 90 tablet 3  . cetirizine (ZYRTEC) 10 MG tablet Take 10 mg by mouth as needed.     . Coenzyme Q10 (COQ10) 100 MG CAPS Take 100 mg by mouth daily.    [name] gabapentin (NEURONTIN) 800 MG tablet Take 800 mg by mouth 3 (three) times daily.     . hydrochlorothiazide (HYDRODIURIL) 25 MG tablet Take 25 mg by mouth daily.    [name] LORazepam (ATIVAN) 1 MG tablet Take 1 mg by mouth at bedtime.     . magnesium gluconate (MAGONATE) 500 MG tablet Take 1,000 mg by mouth 3 (three) times daily.     . metFORMIN (GLUCOPHAGE) 500 MG tablet Take by mouth once.    . modafinil (PROVIGIL) 200 MG tablet Take 0.5 tablets (100 mg total) by mouth daily. 45 tablet 1  . Multiple Vitamins-Minerals (MULTIVITAMIN PO) Take 1 tablet by mouth daily.     . OMEGA 3-6-9 FATTY ACIDS PO Take 500 mg by mouth daily.    . OXcarbazepine ER (OXTELLAR XR) 600 MG TB24 Take 1 tablet by mouth daily.    [name] PROAIR HFA 108 (90 BASE) MCG/ACT inhaler Inhale 1 puff into the lungs every 4 (four) hours as needed for wheezing or shortness of breath.     [name] REXULTI 3 MG TABS Take 1 tablet by mouth daily.  1  . rizatriptan (MAXALT) 10 MG tablet Take 10 mg by mouth as needed for migraine. May  repeat in 2 hours if needed    . thiamine (VITAMIN B-1) 50 MG tablet Take 50 mg by mouth daily.    . vitamin C (ASCORBIC ACID) 500 MG tablet Take 500 mg by mouth daily.    [name] esomeprazole (NEXIUM) 40 MG capsule Take 40 mg by mouth daily before breakfast.     No facility-administered medications prior to visit.     Allergies  Allergen Reactions  . Benadryl [Diphenhydramine Hcl] Hives  . Nuvigil [Armodafinil]     migraines   . Requip [Ropinirole Hcl]     Increased headache    Family History  Problem Relation Age of Onset  . Breast cancer Mother   . Colon polyps Father   . Diabetes Father   . Cirrhosis Father   . Irritable bowel syndrome Sister   .  Breast cancer Maternal Aunt   . Breast cancer Paternal Grandmother   . Colon cancer Paternal Grandmother   . Esophageal cancer Paternal Grandfather   . Stomach cancer Paternal Grandfather     Social History  Substance Use Topics  . Smoking status: Former Smoker    Packs/day: 1.00    Quit date: 01/26/2012  . Smokeless tobacco: Never Used  . Alcohol use No     Comment: History of alcohol abuse    ROS: As per history of present illness, otherwise negative  BP 130/88 (BP Location: Left Arm, Patient Position: Sitting, Cuff Size: Large)   Pulse 100   Ht 5' 4.5" (1.638 m) Comment: height measured without shoes  Wt 264 lb 6 oz (119.9 kg)   LMP 04/25/2013   BMI 44.68 kg/m  Constitutional: Well-developed and well-nourished. No distress. HEENT: Normocephalic and atraumatic. Oropharynx is clear and moist. No oropharyngeal exudate. Conjunctivae are normal.  No scleral icterus. Neck: Neck supple. Trachea midline. Cardiovascular: Normal rate, regular rhythm and intact distal pulses. No M/R/G Pulmonary/chest: Effort normal and breath sounds normal. No wheezing, rales or rhonchi. Abdominal: Soft, obese, nontender, nondistended. Bowel sounds active throughout. There are no masses palpable. No hepatosplenomegaly. Extremities: no clubbing,  cyanosis, or edema Neurological: Alert and oriented to person place and time. Skin: Skin is warm and dry. No rashes noted. Psychiatric: Normal mood and affect. Behavior is normal.  RELEVANT LABS AND IMAGING: CBC    Component Value Date/Time   WBC 13.3 (H) 11/24/2016 1625   RBC 4.75 11/24/2016 1625   HGB 13.6 11/24/2016 1625   HCT 40.5 11/24/2016 1625   PLT 361.0 11/24/2016 1625   MCV 85.3 11/24/2016 1625   MCH 28.2 09/03/2016 0830   MCHC 33.6 11/24/2016 1625   RDW 15.1 11/24/2016 1625   RDW 15.4 06/27/2014 0920   LYMPHSABS 3.0 11/24/2016 1625   LYMPHSABS 2.6 02/25/2014 0911   MONOABS 0.9 11/24/2016 1625   EOSABS 0.2 11/24/2016 1625   EOSABS 0.2 02/25/2014 0911   BASOSABS 0.1 11/24/2016 1625   BASOSABS 0.1 02/25/2014 0911    CMP     Component Value Date/Time   NA 137 11/24/2016 1625   K 3.5 11/24/2016 1625   CL 99 11/24/2016 1625   CO2 29 11/24/2016 1625   GLUCOSE 122 (H) 11/24/2016 1625   BUN 13 11/24/2016 1625   CREATININE 0.66 11/24/2016 1625   CREATININE 0.58 09/03/2016 0830   CALCIUM 9.1 11/24/2016 1625   PROT 7.3 11/24/2016 1625   ALBUMIN 4.3 11/24/2016 1625   AST 19 11/24/2016 1625   ALT 21 11/24/2016 1625   ALKPHOS 69 11/24/2016 1625   BILITOT 0.2 11/24/2016 1625   GFRNONAA >90 06/07/2013 0530   GFRAA >90 06/07/2013 0530    ASSESSMENT/PLAN: 46 year old female with a past medical history of alcohol abuse in remission since 05/08/2008 with history of alcohol hepatitis, GERD, history of colon polyps, history of colonic diverticulosis, history of fibromyalgia, sleep apnea, periodic limb movement disorder and restless leg who is seen in consultation at the request of Dr. Juanita Craver to evaluate pain in the right upper quadrant, frequent diarrhea and acid reflux management.  1. RUQ abd pain -- located in anatomic location for gallbladder the prior gallbladder evaluation is been unremarkable. She had ultrasound and MRCP in 2014. I repeated liver enzymes and  lipase today and they were very normal. I recommended ultrasound elastography to evaluate gallbladder again but also rule out advanced fibrosis. See #2. If ultrasound unrevealing for source of  pain and it persists could consider HIDA scan and/or EGD.  2. History of alcohol abuse and moderate liver fibrosis -- I suspicion is that her fibrosis has regressed and likely not progressed since being alcohol abstinence over the last 8+ years. We are proceeding with ultrasound elastography. Liver enzymes were normal today. Platelets are normal. No evidence for advanced liver disease or cirrhosis clinically. She is actively involved in [place] and at this point is low risk for relapse.  3. GERD -- well-controlled without alarm symptom on pantoprazole. Nexium seemed to work better but insurance denied this. We'll continue pantoprazole 40 mg once daily. EGD records requested for review  4. History of colon polyps -- records requested from Dr. Earlean Shawl. She may be due surveillance colonoscopy at this time. Will review colonoscopies and pathology records once I receive them to determine when screening/surveillance colonoscopy due.  5. Chronic loose stools -- we'll check GI pathogen panel, fecal leukocytes and fecal elastase. My suspicion is that her chronic urgent loose stools are secondary to high-dose magnesium for fibromyalgia and possibly metformin. She has tried to wean down magnesium but this causes exacerbation of fibromyalgia. She would rather live with the loose stools then wean magnesium at this time. Will follow-up stool studies to ensure no contributing factors to loose stools.      RJ:GYLUD A Burnett, [address] [place], [place] [address]

## 2016-11-24 NOTE — Patient Instructions (Signed)
Continue pantoprazole.  Your physician has requested that you go to the basement for the following lab work before leaving today: CBC, CMP, Lipase, GI pathogen panel, fecal elastase, fecal leukocytes  You have been scheduled for an abdominal ultrasound with elastography at [facility] (1st floor of hospital) on 12/07/16 at 11:00 am. Please arrive 15 minutes prior to your appointment for registration. Make certain not to have anything to eat or drink 8 hours prior to your appointment. Should you need to reschedule your appointment, please contact radiology at [phone]. This test typically takes about 30 minutes to perform.  Please follow up with Dr Hilarie Fredrickson in 4 months.  We have requested records from [facility] office.  If you are age 44 or older, your body mass index should be between 23-30. Your Body mass index is 44.68 kg/m. If this is out of the aforementioned range listed, please consider follow up with your Primary Care Provider.  If you are age 63 or younger, your body mass index should be between 19-25. Your Body mass index is 44.68 kg/m. If this is out of the aformentioned range listed, please consider follow up with your Primary Care Provider.

## 2016-12-07 ENCOUNTER — Ambulatory Visit (HOSPITAL_COMMUNITY): Payer: 59

## 2016-12-07 ENCOUNTER — Telehealth: Payer: Self-pay | Admitting: *Deleted

## 2016-12-07 NOTE — Telephone Encounter (Signed)
Dr Hilarie Fredrickson has received and reviewed patient's GI records from Dr Earlean Shawl. He notes: "colonoscopy reviewed. No colon polyps. Repeat colon age 46 for screening. Stomach polyps benign and do not require surveillance."   I have spoken to patient and have advised her of the above. She verbalizes understanding.  Recall colonoscopy has been placed in Epic for 01/2020.

## 2016-12-16 ENCOUNTER — Ambulatory Visit (HOSPITAL_COMMUNITY)
Admission: RE | Admit: 2016-12-16 | Discharge: 2016-12-16 | Disposition: A | Discharge status: Home / Self Care | Payer: 59 | Source: Ambulatory Visit | Attending: Internal Medicine | Admitting: Internal Medicine

## 2016-12-16 ENCOUNTER — Encounter: Payer: Self-pay | Admitting: Internal Medicine

## 2016-12-16 DIAGNOSIS — K740 Hepatic fibrosis: Secondary | ICD-10-CM | POA: Diagnosis not present

## 2016-12-16 DIAGNOSIS — R1011 Right upper quadrant pain: Secondary | ICD-10-CM | POA: Diagnosis present

## 2016-12-16 DIAGNOSIS — I77811 Abdominal aortic ectasia: Secondary | ICD-10-CM | POA: Diagnosis not present

## 2016-12-16 DIAGNOSIS — F1011 Alcohol abuse, in remission: Secondary | ICD-10-CM | POA: Diagnosis present

## 2016-12-16 DIAGNOSIS — K8020 Calculus of gallbladder without cholecystitis without obstruction: Secondary | ICD-10-CM | POA: Diagnosis not present

## 2016-12-16 DIAGNOSIS — R932 Abnormal findings on diagnostic imaging of liver and biliary tract: Secondary | ICD-10-CM | POA: Diagnosis not present

## 2016-12-17 NOTE — Telephone Encounter (Signed)
I spoke to patient by phone this afternoon See result note from recent abd [place] for details of this conversation

## 2016-12-20 ENCOUNTER — Other Ambulatory Visit: Payer: Self-pay

## 2016-12-20 ENCOUNTER — Telehealth: Payer: Self-pay

## 2016-12-20 DIAGNOSIS — K7400 Hepatic fibrosis, unspecified: Secondary | ICD-10-CM

## 2016-12-24 ENCOUNTER — Ambulatory Visit (AMBULATORY_SURGERY_CENTER): Payer: Self-pay

## 2016-12-24 VITALS — Ht 65.0 in | Wt 265.0 lb

## 2016-12-24 DIAGNOSIS — I8500 Esophageal varices without bleeding: Secondary | ICD-10-CM

## 2016-12-24 NOTE — Progress Notes (Signed)
No allergies to eggs or soy No past problems with anesthesia No diet meds No home oxygen  Declined emmi

## 2016-12-27 ENCOUNTER — Encounter: Payer: Self-pay | Admitting: Internal Medicine

## 2016-12-28 ENCOUNTER — Other Ambulatory Visit: Payer: Self-pay | Admitting: Radiology

## 2016-12-29 ENCOUNTER — Other Ambulatory Visit: Payer: Self-pay | Admitting: Radiology

## 2016-12-30 ENCOUNTER — Ambulatory Visit (HOSPITAL_COMMUNITY)
Admission: RE | Admit: 2016-12-30 | Discharge: 2016-12-30 | Disposition: A | Discharge status: Home / Self Care | Payer: 59 | Source: Ambulatory Visit | Attending: Internal Medicine | Admitting: Internal Medicine

## 2016-12-30 ENCOUNTER — Other Ambulatory Visit: Payer: 59

## 2016-12-30 ENCOUNTER — Encounter (HOSPITAL_COMMUNITY): Payer: Self-pay

## 2016-12-30 DIAGNOSIS — Z981 Arthrodesis status: Secondary | ICD-10-CM | POA: Insufficient documentation

## 2016-12-30 DIAGNOSIS — F419 Anxiety disorder, unspecified: Secondary | ICD-10-CM | POA: Insufficient documentation

## 2016-12-30 DIAGNOSIS — K732 Chronic active hepatitis, not elsewhere classified: Secondary | ICD-10-CM | POA: Insufficient documentation

## 2016-12-30 DIAGNOSIS — K7400 Hepatic fibrosis, unspecified: Secondary | ICD-10-CM

## 2016-12-30 DIAGNOSIS — Z888 Allergy status to other drugs, medicaments and biological substances status: Secondary | ICD-10-CM | POA: Diagnosis not present

## 2016-12-30 DIAGNOSIS — G4733 Obstructive sleep apnea (adult) (pediatric): Secondary | ICD-10-CM | POA: Diagnosis not present

## 2016-12-30 DIAGNOSIS — E669 Obesity, unspecified: Secondary | ICD-10-CM | POA: Insufficient documentation

## 2016-12-30 DIAGNOSIS — F329 Major depressive disorder, single episode, unspecified: Secondary | ICD-10-CM | POA: Insufficient documentation

## 2016-12-30 DIAGNOSIS — K760 Fatty (change of) liver, not elsewhere classified: Secondary | ICD-10-CM | POA: Insufficient documentation

## 2016-12-30 DIAGNOSIS — M1990 Unspecified osteoarthritis, unspecified site: Secondary | ICD-10-CM | POA: Insufficient documentation

## 2016-12-30 DIAGNOSIS — Z7984 Long term (current) use of oral hypoglycemic drugs: Secondary | ICD-10-CM | POA: Insufficient documentation

## 2016-12-30 DIAGNOSIS — R7303 Prediabetes: Secondary | ICD-10-CM | POA: Insufficient documentation

## 2016-12-30 DIAGNOSIS — M797 Fibromyalgia: Secondary | ICD-10-CM | POA: Diagnosis not present

## 2016-12-30 DIAGNOSIS — G2581 Restless legs syndrome: Secondary | ICD-10-CM | POA: Diagnosis not present

## 2016-12-30 DIAGNOSIS — K219 Gastro-esophageal reflux disease without esophagitis: Secondary | ICD-10-CM | POA: Insufficient documentation

## 2016-12-30 DIAGNOSIS — E785 Hyperlipidemia, unspecified: Secondary | ICD-10-CM | POA: Insufficient documentation

## 2016-12-30 DIAGNOSIS — Z90710 Acquired absence of both cervix and uterus: Secondary | ICD-10-CM | POA: Diagnosis not present

## 2016-12-30 HISTORY — DX: Prediabetes: R73.03

## 2016-12-30 LAB — CBC WITH DIFFERENTIAL/PLATELET
Basophils Absolute: 0 10*3/uL (ref 0.0–0.1)
Basophils Relative: 0 %
Eosinophils Absolute: 0.1 10*3/uL (ref 0.0–0.7)
Eosinophils Relative: 1 %
HCT: 38.9 % (ref 36.0–46.0)
HEMOGLOBIN: 12.8 g/dL (ref 12.0–15.0)
LYMPHS ABS: 2.6 10*3/uL (ref 0.7–4.0)
LYMPHS PCT: 25 %
MCH: 28.4 pg (ref 26.0–34.0)
MCHC: 32.9 g/dL (ref 30.0–36.0)
MCV: 86.3 fL (ref 78.0–100.0)
MONOS PCT: 6 %
Monocytes Absolute: 0.6 10*3/uL (ref 0.1–1.0)
NEUTROS PCT: 68 %
Neutro Abs: 7 10*3/uL (ref 1.7–7.7)
Platelets: 296 10*3/uL (ref 150–400)
RBC: 4.51 MIL/uL (ref 3.87–5.11)
RDW: 14.4 % (ref 11.5–15.5)
WBC: 10.3 10*3/uL (ref 4.0–10.5)

## 2016-12-30 LAB — COMPREHENSIVE METABOLIC PANEL
ALBUMIN: 3.9 g/dL (ref 3.5–5.0)
ALK PHOS: 64 U/L (ref 38–126)
ALT: 26 U/L (ref 14–54)
ANION GAP: 9 (ref 5–15)
AST: 23 U/L (ref 15–41)
BUN: 9 mg/dL (ref 6–20)
CALCIUM: 8.9 mg/dL (ref 8.9–10.3)
CO2: 27 mmol/L (ref 22–32)
CREATININE: 0.58 mg/dL (ref 0.44–1.00)
Chloride: 101 mmol/L (ref 101–111)
GFR calc Af Amer: >60 mL/min (ref >60)
GFR calc non Af Amer: >60 mL/min (ref >60)
GLUCOSE: 115 mg/dL — AB (ref 65–99)
Potassium: 3.7 mmol/L (ref 3.5–5.1)
Sodium: 137 mmol/L (ref 135–145)
Total Bilirubin: 0.5 mg/dL (ref 0.3–1.2)
Total Protein: 7.4 g/dL (ref 6.5–8.1)

## 2016-12-30 LAB — PROTIME-INR
INR: 0.99
Prothrombin Time: 13.1 seconds (ref 11.4–15.2)

## 2016-12-30 LAB — GLUCOSE, CAPILLARY: GLUCOSE-CAPILLARY: 115 mg/dL — AB (ref 65–99)

## 2016-12-30 MED ORDER — MIDAZOLAM HCL 2 MG/2ML IJ SOLN
INTRAMUSCULAR | Status: AC
  Filled 2016-12-30: qty 4

## 2016-12-30 MED ORDER — FLUMAZENIL 0.5 MG/5ML IV SOLN
INTRAVENOUS | Status: AC
  Filled 2016-12-30: qty 5

## 2016-12-30 MED ORDER — MIDAZOLAM HCL 2 MG/2ML IJ SOLN
INTRAMUSCULAR | Status: AC | PRN
  Administered 2016-12-30 (×4): 1 mg via INTRAVENOUS

## 2016-12-30 MED ORDER — NALOXONE HCL 0.4 MG/ML IJ SOLN
INTRAMUSCULAR | Status: AC
  Filled 2016-12-30: qty 1

## 2016-12-30 MED ORDER — SODIUM CHLORIDE 0.9 % IV SOLN
INTRAVENOUS | Status: DC
  Administered 2016-12-30: 12:00:00 via INTRAVENOUS

## 2016-12-30 MED ORDER — HYDROCODONE-ACETAMINOPHEN 5-325 MG PO TABS
1.0000 | ORAL_TABLET | ORAL | Status: DC | PRN
  Administered 2016-12-30: 1 via ORAL
  Filled 2016-12-30: qty 1

## 2016-12-30 MED ORDER — FENTANYL CITRATE (PF) 100 MCG/2ML IJ SOLN
INTRAMUSCULAR | Status: AC
  Filled 2016-12-30: qty 4

## 2016-12-30 MED ORDER — FENTANYL CITRATE (PF) 100 MCG/2ML IJ SOLN
INTRAMUSCULAR | Status: AC | PRN
  Administered 2016-12-30 (×4): 50 ug via INTRAVENOUS

## 2016-12-30 NOTE — Discharge Instructions (Signed)
Liver Biopsy, Care After Introduction These instructions give you information on caring for yourself after your procedure. Your doctor may also give you more specific instructions. Call your doctor if you have any problems or questions after your procedure. Follow these instructions at home:  Rest at home for 1-2 days or as told by your doctor.  Have someone stay with you for at least 24 hours.  Do not do these things in the first 24 hours:  Drive.  Use machinery.  Take care of other people.  Sign legal documents.  Take a bath or shower.  There are many different ways to close and cover a cut (incision). For example, a cut can be closed with stitches, skin glue, or adhesive strips. Follow your doctor's instructions on:  Taking care of your cut.  Changing and removing your bandage (dressing).  Removing whatever was used to close your cut.  Do not drink alcohol in the first week.  Do not lift more than 5 pounds or play contact sports for the first 2 weeks.  Take medicines only as told by your doctor. For 1 week, do not take medicine that has aspirin in it or medicines like ibuprofen.  Get your test results. Contact a doctor if:  A cut bleeds and leaves more than just a small spot of blood.  A cut is red, puffs up (swells), or hurts more than before.  Fluid or something else comes from a cut.  A cut smells bad.  You have a fever or chills. Get help right away if:  You have swelling, bloating, or pain in your belly (abdomen).  You get dizzy or faint.  You have a rash.  You feel sick to your stomach (nauseous) or throw up (vomit).  You have trouble breathing, feel short of breath, or feel faint.  Your chest hurts.  You have problems talking or seeing.  You have trouble balancing or moving your arms or legs. This information is not intended to replace advice given to you by your health care provider. Make sure you discuss any questions you have with your  health care provider. Document Released: 08/17/2008 Document Revised: 04/15/2016 Document Reviewed: 01/04/2014  2017 Elsevier Moderate Conscious Sedation, Adult, Care After These instructions provide you with information about caring for yourself after your procedure. Your health care provider may also give you more specific instructions. Your treatment has been planned according to current medical practices, but problems sometimes occur. Call your health care provider if you have any problems or questions after your procedure. What can I expect after the procedure? After your procedure, it is common:  To feel sleepy for several hours.  To feel clumsy and have poor balance for several hours.  To have poor judgment for several hours.  To vomit if you eat too soon. Follow these instructions at home: For at least 24 hours after the procedure:   Do not:  Participate in activities where you could fall or become injured.  Drive.  Use heavy machinery.  Drink alcohol.  Take sleeping pills or medicines that cause drowsiness.  Make important decisions or sign legal documents.  Take care of children on your own.  Rest. Eating and drinking  Follow the diet recommended by your health care provider.  If you vomit:  Drink water, juice, or soup when you can drink without vomiting.  Make sure you have little or no nausea before eating solid foods. General instructions  Have a responsible adult stay with you until  you are awake and alert.  Take over-the-counter and prescription medicines only as told by your health care provider.  If you smoke, do not smoke without supervision.  Keep all follow-up visits as told by your health care provider. This is important. Contact a health care provider if:  You keep feeling nauseous or you keep vomiting.  You feel light-headed.  You develop a rash.  You have a fever. Get help right away if:  You have trouble breathing. This  information is not intended to replace advice given to you by your health care provider. Make sure you discuss any questions you have with your health care provider. Document Released: 08/29/2013 Document Revised: 04/12/2016 Document Reviewed: 02/28/2016 Elsevier Interactive Patient Education  2017 [employer].

## 2016-12-30 NOTE — H&P (Signed)
Referring Physician(s): Pyrtle,Jay M  Supervising Physician: Arne Cleveland  Patient Status:  Gloria [name] OP  Chief Complaint:  "I'm here for a liver biopsy"  Subjective: Patient familiar to IR service from prior random core liver biopsy in 2009 which revealed mild chronic active hepatitis with increased fibrosis. She has a past history of alcohol abuse. Recent hepatic ultrasound/elastography from 12/16/16 revealed increasing fibrosis currently at F3/F4. She presents today for repeat image guided random core liver biopsy to rule out cirrhosis. Latest LFTs are normal. She is currently asymptomatic. Past Medical History:  Diagnosis Date  . Allergy   . Anemia   . Anxiety   . Arthritis   . Bulging disc    X 2  . Chronic active hepatitis ([place])   . Chronic back pain 02/25/2015  . Chronic headaches   . Depression   . Diverticulosis   . Elevated LFTs   . Fibromyalgia   . GERD (gastroesophageal reflux disease)   . History of ETOH abuse   . Hyperlipidemia   . Obese   . Obstructive sleep apnea 02/25/2015  . Osteoarthritis    lower back  . Periodic limb movement disorder 02/25/2014  . Pre-diabetes    pt taking metformin  . RLS (restless legs syndrome)   . Smoker   . Substance abuse    Past Surgical History:  Procedure Laterality Date  . CERVICAL FUSION    . CYSTOSCOPY  06/06/2013   Procedure: CYSTOSCOPY;  Surgeon: Terrance Mass, MD;  Location: [facility];  Service: Gynecology;;  . INTRAUTERINE DEVICE INSERTION  08/09/2008   PARAGUARD  . kidney surgery  1995   abcess  . LAPAROSCOPY N/A 06/06/2013   Procedure: LAPAROSCOPY DIAGNOSTIC;  Surgeon: Terrance Mass, MD;  Location: [facility];  Service: Gynecology;  Laterality: N/A;  . LAPAROTOMY  06/06/2013   Procedure: EXPLORATORY LAPAROTOMY;  Surgeon: Terrance Mass, MD;  Location: [facility];  Service: Gynecology;;  . radial tunnel Bilateral 2005  . VAGINAL HYSTERECTOMY Left 06/06/2013   Procedure: Vaginal Hysterectomy with Patiial Right  Salpingectomy;  Surgeon: Terrance Mass, MD;  Location: [facility];  Service: Gynecology;  Laterality: Left;      Allergies: Benadryl [diphenhydramine hcl]; Nuvigil [armodafinil]; and Requip [ropinirole hcl]  Medications: Prior to Admission medications   Medication Sig Start Date End Date Taking? Authorizing Provider  B Complex Vitamins (B COMPLEX PO) Take 1 tablet by mouth daily.   Yes Historical Provider, MD  Calcium Carbonate-Vitamin D (CALCIUM 600/VITAMIN D) 600-400 MG-UNIT per tablet Take 1 tablet by mouth 2 (two) times daily.    Yes Historical Provider, MD  Carbidopa-Levodopa ER (SINEMET CR) 25-100 MG tablet controlled release Take 1 tablet by mouth at bedtime. 08/30/16  Yes Kathrynn Ducking, MD  Coenzyme Q10 (COQ10) 100 MG CAPS Take 100 mg by mouth daily.   Yes Historical Provider, MD  gabapentin (NEURONTIN) 800 MG tablet Take 800 mg by mouth 3 (three) times daily.    Yes Historical Provider, MD  hydrochlorothiazide (HYDRODIURIL) 25 MG tablet Take 25 mg by mouth daily.   Yes Historical Provider, MD  LORazepam (ATIVAN) 1 MG tablet Take 1 mg by mouth at bedtime.    Yes Historical Provider, MD  magnesium gluconate (MAGONATE) 500 MG tablet Take 500 mg by mouth 3 (three) times daily.    Yes Historical Provider, MD  metFORMIN (GLUCOPHAGE) 500 MG tablet Take by mouth once.   Yes Historical Provider, MD  modafinil (PROVIGIL) 200 MG tablet Take 0.5 tablets (100  mg total) by mouth daily. 10/26/16  Yes Kathrynn Ducking, MD  Multiple Vitamins-Minerals (MULTIVITAMIN PO) Take 1 tablet by mouth daily.    Yes Historical Provider, MD  OMEGA 3-6-9 FATTY ACIDS PO Take 500 mg by mouth daily.   Yes Historical Provider, MD  OXcarbazepine ER (OXTELLAR XR) 600 MG TB24 Take 1 tablet by mouth daily.   Yes Historical Provider, MD  PROTONIX 40 MG tablet Take 1 capsule by mouth daily. 11/16/16  Yes Historical Provider, MD  REXULTI 3 MG TABS Take 1 tablet by mouth daily. 11/04/15  Yes Historical Provider, MD    rizatriptan (MAXALT) 10 MG tablet Take 10 mg by mouth as needed for migraine. May repeat in 2 hours if needed   Yes Historical Provider, MD  thiamine (VITAMIN B-1) 50 MG tablet Take 50 mg by mouth daily.   Yes Historical Provider, MD  vitamin C (ASCORBIC ACID) 500 MG tablet Take 500 mg by mouth daily.   Yes Historical Provider, MD  cetirizine (ZYRTEC) 10 MG tablet Take 10 mg by mouth as needed.     Historical Provider, MD  PROAIR HFA 108 (90 BASE) MCG/ACT inhaler Inhale 1 puff into the lungs every 4 (four) hours as needed for wheezing or shortness of breath.  08/23/13   Historical Provider, MD     Vital Signs: BP 133/74 (BP Location: Right Arm)   Pulse 90   Temp 99 F (37.2 C) (Oral)   Resp 16   LMP 04/25/2013   SpO2 96%   Physical Exam awake, alert. Chest clear to auscultation bilaterally. Heart with regular rate and rhythm. Abdomen obese, soft, positive bowel sounds, nontender. Lower extremities -no edema.  Imaging: No results found.  Labs:  CBC:  Recent Labs  09/03/16 0830 11/24/16 1625 12/30/16 1119  WBC 9.2 13.3* 10.3  HGB 12.3 13.6 12.8  HCT 37.6 40.5 38.9  PLT 266 361.0 296    COAGS:  Recent Labs  12/30/16 1119  INR 0.99    BMP:  Recent Labs  09/03/16 0830 11/24/16 1625 12/30/16 1119  NA 136 137 137  K 3.7 3.5 3.7  CL 98 99 101  CO2 27 29 27   GLUCOSE 171* 122* 115*  BUN 7 13 9   CALCIUM 8.8 9.1 8.9  CREATININE 0.58 0.66 0.58  GFRNONAA  --   --  >60  GFRAA  --   --  >60    LIVER FUNCTION TESTS:  Recent Labs  09/03/16 0830 11/24/16 1625 12/30/16 1119  BILITOT 0.3 0.2 0.5  AST 18 19 23   ALT 16 21 26   ALKPHOS 60 69 64  PROT 6.2 7.3 7.4  ALBUMIN 3.6 4.3 3.9    Assessment and Plan: Patient with past history of alcohol abuse and mild chronic active hepatitis with increasing fibrosis from 2009 random core liver biopsy.Recent hepatic ultrasound/elastography from 12/16/16 revealed increasing fibrosis currently at F3/F4. She presents today  for repeat image guided random core liver biopsy to rule out cirrhosis. Latest LFTs are normal. Risks and benefits discussed with the patient including, but not limited to bleeding, infection, damage to adjacent structures or low yield requiring additional tests. All of the patient's questions were answered, patient is agreeable to proceed. Consent signed and in chart.     Electronically Signed: D. Rowe Robert 12/30/2016, 12:01 PM   I spent a total of 25 minutes at the the patient's bedside AND on the patient's hospital floor or unit, greater than 50% of which was counseling/coordinating care for ultrasound-guided  random core liver biopsy

## 2016-12-30 NOTE — Procedures (Signed)
US liver core 18g x3 to surg path No complication No blood loss. See complete dictation in [facility].

## 2016-12-31 LAB — FECAL LACTOFERRIN, QUANT: Lactoferrin: POSITIVE

## 2017-01-04 LAB — GASTROINTESTINAL PATHOGEN PANEL PCR
C. DIFFICILE TOX A/B, PCR: NOT DETECTED
CRYPTOSPORIDIUM, PCR: NOT DETECTED
Campylobacter, PCR: NOT DETECTED
E COLI (ETEC) LT/ST, PCR: NOT DETECTED
E COLI (STEC) STX1/STX2, PCR: NOT DETECTED
E coli 0157, PCR: NOT DETECTED
GIARDIA LAMBLIA, PCR: NOT DETECTED
NOROVIRUS, PCR: NOT DETECTED
ROTAVIRUS, PCR: NOT DETECTED
Salmonella, PCR: NOT DETECTED
Shigella, PCR: NOT DETECTED

## 2017-01-06 ENCOUNTER — Encounter: Payer: 59 | Admitting: Internal Medicine

## 2017-01-06 LAB — PANCREATIC ELASTASE, FECAL: Pancreatic Elastase-1, Stool: >500 mcg/g

## 2017-02-04 ENCOUNTER — Ambulatory Visit: Payer: 59 | Admitting: Internal Medicine

## 2017-03-03 ENCOUNTER — Ambulatory Visit: Payer: 59 | Admitting: Neurology

## 2017-03-31 ENCOUNTER — Ambulatory Visit (INDEPENDENT_AMBULATORY_CARE_PROVIDER_SITE_OTHER): Payer: 59 | Admitting: Neurology

## 2017-03-31 ENCOUNTER — Encounter: Payer: Self-pay | Admitting: Neurology

## 2017-03-31 VITALS — BP 115/64 | HR 84 | Ht 65.0 in | Wt 263.5 lb

## 2017-03-31 DIAGNOSIS — G2581 Restless legs syndrome: Secondary | ICD-10-CM

## 2017-03-31 DIAGNOSIS — M797 Fibromyalgia: Secondary | ICD-10-CM | POA: Diagnosis not present

## 2017-03-31 DIAGNOSIS — Z8669 Personal history of other diseases of the nervous system and sense organs: Secondary | ICD-10-CM | POA: Diagnosis not present

## 2017-03-31 NOTE — Progress Notes (Signed)
Reason for visit: Fibromyalgia  Gloria [name] is an 46 y.o. female  History of present illness:  Gloria [name] is a 46 year old right-handed white female with a history of obesity, fibromyalgia, migraine headache, and periodic limb movement disorder. The patient is on gabapentin currently on 400 mg 3 times daily, she has cut back from 800 mg 3 times daily without much change in her underlying symptoms. The patient was having quite a bit of fatigue during the day on the higher dose of gabapentin, by cutting back she was able to improve the fatigue and be able to come off of Nuvigil. The patient remains on Sinemet at night for her periodic limb movement disorder taking a 25/100 mg tablet. She is on Oxtellar for depression and anxiety, and she believes that this has been very effective for her. The patient has cut back on her magnesium supplementation secondary to diarrhea. At times, the patient may wake up with a sensation of suffocation or difficulty with breathing. This may occur once every 2 weeks or so. She returns to this office for an evaluation. The migraine headaches are occurring about twice a week, weather changes are a big activator for her headaches.   Past Medical History:  Diagnosis Date  . Allergy   . Anemia   . Anxiety   . Arthritis   . Bulging disc    X 2  . Chronic active hepatitis ([place])   . Chronic back pain 02/25/2015  . Chronic headaches   . Depression   . Diverticulosis   . Elevated LFTs   . Fibromyalgia   . GERD (gastroesophageal reflux disease)   . History of ETOH abuse   . Hyperlipidemia   . Obese   . Obstructive sleep apnea 02/25/2015  . Osteoarthritis    lower back  . Periodic limb movement disorder 02/25/2014  . Pre-diabetes    pt taking metformin  . RLS (restless legs syndrome)   . Smoker   . Substance abuse     Past Surgical History:  Procedure Laterality Date  . CERVICAL FUSION    . CYSTOSCOPY  06/06/2013   Procedure: CYSTOSCOPY;  Surgeon: Terrance Mass, MD;  Location: [facility];  Service: Gynecology;;  . INTRAUTERINE DEVICE INSERTION  08/09/2008   PARAGUARD  . kidney surgery  1995   abcess  . LAPAROSCOPY N/A 06/06/2013   Procedure: LAPAROSCOPY DIAGNOSTIC;  Surgeon: Terrance Mass, MD;  Location: [facility];  Service: Gynecology;  Laterality: N/A;  . LAPAROTOMY  06/06/2013   Procedure: EXPLORATORY LAPAROTOMY;  Surgeon: Terrance Mass, MD;  Location: [facility];  Service: Gynecology;;  . radial tunnel Bilateral 2005  . VAGINAL HYSTERECTOMY Left 06/06/2013   Procedure: Vaginal Hysterectomy with Patiial Right Salpingectomy;  Surgeon: Terrance Mass, MD;  Location: [facility];  Service: Gynecology;  Laterality: Left;    Family History  Problem Relation Age of Onset  . Breast cancer Mother   . Colon polyps Father   . Diabetes Father   . Cirrhosis Father   . Irritable bowel syndrome Sister   . Breast cancer Maternal Aunt   . Breast cancer Paternal Grandmother   . Colon cancer Paternal Grandmother   . Esophageal cancer Paternal Grandfather   . Stomach cancer Paternal Grandfather     Social history:  reports that she quit smoking about 5 years ago. She smoked 1.00 pack per day. She has never used smokeless tobacco. She reports that she does not drink alcohol or use drugs.  Allergies  Allergen Reactions  . Benadryl [Diphenhydramine Hcl] Hives  . Nuvigil [Armodafinil]     migraines   . Requip [Ropinirole Hcl]     Increased headache    Medications:  Prior to Admission medications   Medication Sig Start Date End Date Taking? Authorizing Provider  B Complex Vitamins (B COMPLEX PO) Take 1 tablet by mouth daily.   Yes [provider]  Calcium Carbonate-Vitamin D (CALCIUM 600/VITAMIN D) 600-400 MG-UNIT per tablet Take 1 tablet by mouth 2 (two) times daily.    Yes [provider]  Carbidopa-Levodopa ER (SINEMET CR) 25-100 MG tablet controlled release Take 1 tablet by mouth at bedtime. 08/30/16  Yes Kathrynn Ducking, MD   Coenzyme Q10 (COQ10) 100 MG CAPS Take 100 mg by mouth daily.   Yes [provider]  gabapentin (NEURONTIN) 800 MG tablet Take 400 mg by mouth 3 (three) times daily.    Yes [provider]  hydrochlorothiazide (HYDRODIURIL) 25 MG tablet Take 25 mg by mouth daily.   Yes [provider]  loratadine (CLARITIN) 10 MG tablet Take 10 mg by mouth daily.   Yes [provider]  LORazepam (ATIVAN) 1 MG tablet Take 1 mg by mouth at bedtime.    Yes [provider]  Multiple Vitamins-Minerals (MULTIVITAMIN PO) Take 1 tablet by mouth daily.    Yes [provider]  OMEGA 3-6-9 FATTY ACIDS PO Take 500 mg by mouth daily.   Yes [provider]  OXcarbazepine ER (OXTELLAR XR) 600 MG TB24 Take 1 tablet by mouth daily.   Yes [provider]  PROAIR HFA 108 (90 BASE) MCG/ACT inhaler Inhale 1 puff into the lungs every 4 (four) hours as needed for wheezing or shortness of breath.  08/23/13  Yes [provider]  PROTONIX 40 MG tablet Take 1 capsule by mouth daily. 11/16/16  Yes [provider]  REXULTI 3 MG TABS Take 1 tablet by mouth daily. 11/04/15  Yes [provider]  rizatriptan (MAXALT) 10 MG tablet Take 10 mg by mouth as needed for migraine. May repeat in 2 hours if needed   Yes [provider]  thiamine (VITAMIN B-1) 50 MG tablet Take 50 mg by mouth daily.   Yes [provider]  vitamin C (ASCORBIC ACID) 500 MG tablet Take 500 mg by mouth daily.   Yes [provider]    ROS:  Out of a complete 14 system review of symptoms, the patient complains only of the following symptoms, and all other reviewed systems are negative.  Blurred vision  Wheezing, shortness of breath  Restless legs, sleep apnea, snoring  Environmental allergies  Back pain, neck pain, neck stiffness  Moles  Headache  Anxiety   Blood pressure 115/64, pulse 84, height 5' 5"  (1.651 m), weight 263 lb 8 oz (119.5 kg),  last menstrual period 04/25/2013.  Physical Exam  General: The patient is alert and cooperative at the time of the examination.The patient is markedly obese.  Skin: No significant peripheral edema is noted.   Neurologic Exam  Mental status: The patient is alert and oriented x 3 at the time of the examination. The patient has apparent normal recent and remote memory, with an apparently normal attention span and concentration ability.   Cranial nerves: Facial symmetry is present. Speech is normal, no aphasia or dysarthria is noted. Extraocular movements are full. Visual fields are full.  Motor: The patient has good strength in all 4 extremities.  Sensory examination: Soft  touch sensation is symmetric on the face, arms, and legs.  Coordination: The patient has good finger-nose-finger and heel-to-shin bilaterally.  Gait and station: The patient has a normal gait. Tandem gait is normal. Romberg is negative. No drift is seen.  Reflexes: Deep tendon reflexes are symmetric.   Assessment/Plan:  1. Fibromyalgia   2. Migraine headache   3. Periodic limb movement disorder   The patient is doing well currently, she may try to cut back on the gabapentin to 400 mg twice daily for 3 weeks and then go to 400 mg at night. If no change in discomfort is noted, she can remain on this lower dose regimen. The patient has had significant weight gain on the gabapentin. She will follow-up in 6 months, she will continue the Sinemet at night.    Jill Alexanders MD 03/31/2017 2:06 PM  [facility] [address] [place], [place] [address]  Phone [phone] Fax [phone]

## 2017-04-06 ENCOUNTER — Other Ambulatory Visit: Payer: Self-pay | Admitting: Gynecology

## 2017-04-06 ENCOUNTER — Encounter: Payer: Self-pay | Admitting: Gynecology

## 2017-04-06 DIAGNOSIS — Z1231 Encounter for screening mammogram for malignant neoplasm of breast: Secondary | ICD-10-CM

## 2017-04-21 ENCOUNTER — Ambulatory Visit
Admission: RE | Admit: 2017-04-21 | Discharge: 2017-04-21 | Disposition: A | Discharge status: Home / Self Care | Payer: 59 | Source: Ambulatory Visit | Attending: Gynecology | Admitting: Gynecology

## 2017-04-21 DIAGNOSIS — Z1231 Encounter for screening mammogram for malignant neoplasm of breast: Secondary | ICD-10-CM

## 2017-06-29 ENCOUNTER — Encounter: Payer: Self-pay | Admitting: Internal Medicine

## 2017-08-20 ENCOUNTER — Other Ambulatory Visit: Payer: Self-pay | Admitting: Neurology

## 2017-09-08 ENCOUNTER — Encounter: Payer: 59 | Admitting: Obstetrics & Gynecology

## 2017-10-06 ENCOUNTER — Encounter: Payer: Self-pay | Admitting: Adult Health

## 2017-10-06 ENCOUNTER — Ambulatory Visit: Payer: 59 | Admitting: Adult Health

## 2017-10-06 VITALS — BP 122/66 | HR 80 | Ht 65.0 in | Wt 259.6 lb

## 2017-10-06 DIAGNOSIS — M797 Fibromyalgia: Secondary | ICD-10-CM | POA: Diagnosis not present

## 2017-10-06 DIAGNOSIS — R5383 Other fatigue: Secondary | ICD-10-CM

## 2017-10-06 DIAGNOSIS — G2581 Restless legs syndrome: Secondary | ICD-10-CM | POA: Diagnosis not present

## 2017-10-06 NOTE — Progress Notes (Signed)
I have read the note, and I agree with the clinical assessment and plan.  Evianna Chandran K [place]   

## 2017-10-06 NOTE — Patient Instructions (Signed)
Your Plan:  Continue sinemet Blood work today If your symptoms worsen or you develop new symptoms please let us know.  Thank you for coming to see [place] at [facility]. I hope we have been able to provide you high quality care today.  You may receive a patient satisfaction survey over the next few weeks. We would appreciate your feedback and comments so that we may continue to improve ourselves and the health of our patients.

## 2017-10-06 NOTE — Progress Notes (Signed)
PATIENT: Gloria [name] DOB: 1970-12-29  REASON FOR VISIT: follow up HISTORY FROM: patient  HISTORY OF PRESENT ILLNESS: Today 10/06/17  Gloria [name] is a 46 year old female with a history of fibromyalgia, migraine headaches periodic limb movement disorder.  She returns today for follow-up.  She reports that she is no longer on gabapentin.  She states that this did not improve her fatigue.  She does report that her pain from fibromyalgia did not get any worse.  She states that she feels tired all the time.  Reports that it is a struggle to go to work.  The patient is very tearful today.  Reports that she has no quality of life with this type of fatigue.  In the past she has tried nuvigil however it made her restless leg symptoms worse.  She has never been on a stimulant but reports that she does not wish to try stimulants due to her past history of addiction.  She continues to take Sinemet for periodic leg movements and reports that this works well for her.  She goes to bed around 1030 each night and arises at 6 AM.  She has had a sleep study in the past that showed borderline sleep apnea.  The patient was not treated with CPAP.  She returns today for an evaluation.  HISTORY Gloria [name] is a 46 year old right-handed white female with a history of obesity, fibromyalgia, migraine headache, and periodic limb movement disorder. The patient is on gabapentin currently on 400 mg 3 times daily, she has cut back from 800 mg 3 times daily without much change in her underlying symptoms. The patient was having quite a bit of fatigue during the day on the higher dose of gabapentin, by cutting back she was able to improve the fatigue and be able to come off of Nuvigil. The patient remains on Sinemet at night for her periodic limb movement disorder taking a 25/100 mg tablet. She is on Oxtellar for depression and anxiety, and she believes that this has been very effective for her. The patient has cut back on her magnesium  supplementation secondary to diarrhea. At times, the patient may wake up with a sensation of suffocation or difficulty with breathing. This may occur once every 2 weeks or so. She returns to this office for an evaluation. The migraine headaches are occurring about twice a week, weather changes are a big activator for her headaches  REVIEW OF SYSTEMS: Out of a complete 14 system review of symptoms, the patient complains only of the following symptoms, and all other reviewed systems are negative.  Restless leg, frequent waking, daytime sleepiness, back pain or neck stiffness, confusion, decreased concentration, depression, nervous/anxious, weakness, environmental allergies, blurred vision, fatigue, activity change  ALLERGIES: Allergies  Allergen Reactions  . Benadryl [Diphenhydramine Hcl] Hives  . Nuvigil [Armodafinil]     migraines   . Requip [Ropinirole Hcl]     Increased headache    HOME MEDICATIONS: Outpatient Medications Prior to Visit  Medication Sig Dispense Refill  . B Complex Vitamins (B COMPLEX PO) Take 1 tablet by mouth daily.    . Calcium Carbonate-Vitamin D (CALCIUM 600/VITAMIN D) 600-400 MG-UNIT per tablet Take 1 tablet by mouth 2 (two) times daily.     . Carbidopa-Levodopa ER (SINEMET CR) 25-100 MG tablet controlled release TAKE 1 TABLET BY MOUTH AT BEDTIME. 90 tablet 3  . Coenzyme Q10 (COQ10) 100 MG CAPS Take 100 mg by mouth daily.    [name] loratadine (CLARITIN) 10 MG tablet  Take 10 mg by mouth daily.    [name] LORazepam (ATIVAN) 1 MG tablet Take 1 mg by mouth at bedtime.     . Multiple Vitamins-Minerals (MULTIVITAMIN PO) Take 1 tablet by mouth daily.     . OMEGA 3-6-9 FATTY ACIDS PO Take 500 mg by mouth daily.    . OXcarbazepine ER (OXTELLAR XR) 600 MG TB24 Take 1.5 tablets at bedtime by mouth.     [name] PROAIR HFA 108 (90 BASE) MCG/ACT inhaler Inhale 1 puff into the lungs every 4 (four) hours as needed for wheezing or shortness of breath.     [name] PROTONIX 40 MG tablet Take 1 capsule by  mouth daily.    [name] REXULTI 3 MG TABS Take 1 tablet by mouth daily.  1  . rizatriptan (MAXALT) 10 MG tablet Take 10 mg by mouth as needed for migraine. May repeat in 2 hours if needed    . thiamine (VITAMIN B-1) 50 MG tablet Take 50 mg by mouth daily.    . vitamin C (ASCORBIC ACID) 500 MG tablet Take 500 mg by mouth daily.    [name] gabapentin (NEURONTIN) 800 MG tablet Take 400 mg by mouth 3 (three) times daily.     . hydrochlorothiazide (HYDRODIURIL) 25 MG tablet Take 25 mg by mouth daily.     No facility-administered medications prior to visit.     PAST MEDICAL HISTORY: Past Medical History:  Diagnosis Date  . Allergy   . Anemia   . Anxiety   . Arthritis   . Bulging disc    X 2  . Chronic active hepatitis ([place])   . Chronic back pain 02/25/2015  . Chronic headaches   . Depression   . Diverticulosis   . Elevated LFTs   . Fibromyalgia   . GERD (gastroesophageal reflux disease)   . History of ETOH abuse   . Hyperlipidemia   . Obese   . Obstructive sleep apnea 02/25/2015  . Osteoarthritis    lower back  . Periodic limb movement disorder 02/25/2014  . Pre-diabetes    pt taking metformin  . RLS (restless legs syndrome)   . Smoker   . Substance abuse ([place])     PAST SURGICAL HISTORY: Past Surgical History:  Procedure Laterality Date  . CERVICAL FUSION    . CYSTOSCOPY  06/06/2013   Procedure: CYSTOSCOPY;  Surgeon: Terrance Mass, MD;  Location: [facility];  Service: Gynecology;;  . INTRAUTERINE DEVICE INSERTION  08/09/2008   PARAGUARD  . kidney surgery  1995   abcess  . LAPAROSCOPY N/A 06/06/2013   Procedure: LAPAROSCOPY DIAGNOSTIC;  Surgeon: Terrance Mass, MD;  Location: [facility];  Service: Gynecology;  Laterality: N/A;  . LAPAROTOMY  06/06/2013   Procedure: EXPLORATORY LAPAROTOMY;  Surgeon: Terrance Mass, MD;  Location: [facility];  Service: Gynecology;;  . radial tunnel Bilateral 2005  . VAGINAL HYSTERECTOMY Left 06/06/2013   Procedure: Vaginal Hysterectomy with Patiial Right  Salpingectomy;  Surgeon: Terrance Mass, MD;  Location: [facility];  Service: Gynecology;  Laterality: Left;    FAMILY HISTORY: Family History  Problem Relation Age of Onset  . Breast cancer Mother   . Colon polyps Father   . Diabetes Father   . Cirrhosis Father   . Irritable bowel syndrome Sister   . Breast cancer Maternal Aunt   . Breast cancer Paternal Grandmother   . Colon cancer Paternal Grandmother   . Esophageal cancer Paternal Grandfather   . Stomach cancer Paternal Grandfather  SOCIAL HISTORY: Social History   Socioeconomic History  . Marital status: Divorced    Spouse name: Not on file  . Number of children: 1  . Years of education: 75  . Highest education level: Not on file  Social Needs  . Financial resource strain: Not on file  . Food insecurity - worry: Not on file  . Food insecurity - inability: Not on file  . Transportation needs - medical: Not on file  . Transportation needs - non-medical: Not on file  Occupational History  . Occupation: Child psychotherapist: [employer]  Tobacco Use  . Smoking status: Former Smoker    Packs/day: 1.00    Last attempt to quit: 01/26/2012    Years since quitting: 5.6  . Smokeless tobacco: Never Used  Substance and Sexual Activity  . Alcohol use: No    Alcohol/week: 0.0 oz    Comment: History of alcohol abuse  . Drug use: No  . Sexual activity: Not Currently  Other Topics Concern  . Not on file  Social History Narrative   Patient lives at home and works full time.   Education some college.   Right handed.   Caffeine: 2 cups daily.      PHYSICAL EXAM  Vitals:   10/06/17 1350  BP: 122/66  Pulse: 80  Weight: 259 lb 9.6 oz (117.8 kg)  Height: 5' 5"  (1.651 m)   Body mass index is 43.2 kg/m.  Generalized: Well developed, in no acute distress   Neurological examination  Mentation: Alert oriented to time, place, history taking. Follows all commands speech and language fluent Cranial  nerve II-XII: Pupils were equal round reactive to light. Extraocular movements were full, visual field were full on confrontational test. Facial sensation and strength were normal. Uvula tongue midline. Head turning and shoulder shrug  were normal and symmetric. Motor: The motor testing reveals 5 over 5 strength of all 4 extremities. Good symmetric motor tone is noted throughout.  Sensory: Sensory testing is intact to soft touch on all 4 extremities. No evidence of extinction is noted.  Coordination: Cerebellar testing reveals good finger-nose-finger and heel-to-shin bilaterally.  Gait and station: Gait is normal. Tandem gait is normal. Romberg is negative. No drift is seen.  Reflexes: Deep tendon reflexes are symmetric and normal bilaterally.   DIAGNOSTIC DATA (LABS, IMAGING, TESTING) - I reviewed patient records, labs, notes, testing and imaging myself where available.  Lab Results  Component Value Date   WBC 10.3 12/30/2016   HGB 12.8 12/30/2016   HCT 38.9 12/30/2016   MCV 86.3 12/30/2016   PLT 296 12/30/2016      Component Value Date/Time   NA 137 12/30/2016 1119   K 3.7 12/30/2016 1119   CL 101 12/30/2016 1119   CO2 27 12/30/2016 1119   GLUCOSE 115 (H) 12/30/2016 1119   BUN 9 12/30/2016 1119   CREATININE 0.58 12/30/2016 1119   CREATININE 0.58 09/03/2016 0830   CALCIUM 8.9 12/30/2016 1119   PROT 7.4 12/30/2016 1119   ALBUMIN 3.9 12/30/2016 1119   AST 23 12/30/2016 1119   ALT 26 12/30/2016 1119   ALKPHOS 64 12/30/2016 1119   BILITOT 0.5 12/30/2016 1119   GFRNONAA >60 12/30/2016 1119   GFRAA >60 12/30/2016 1119   Lab Results  Component Value Date   CHOL 188 09/03/2016   HDL 29 (L) 09/03/2016   LDLCALC 94 09/03/2016   TRIG 325 (H) 09/03/2016   CHOLHDL 6.5 (H) 09/03/2016   Lab  Results  Component Value Date   HGBA1C 6.1 (H) 10/27/2012   No results found for: VITAMINB12 Lab Results  Component Value Date   TSH 1.75 09/03/2016      ASSESSMENT AND PLAN 46 y.o.  year old female  has a past medical history of Allergy, Anemia, Anxiety, Arthritis, Bulging disc, Chronic active hepatitis ([place]), Chronic back pain (02/25/2015), Chronic headaches, Depression, Diverticulosis, Elevated LFTs, Fibromyalgia, GERD (gastroesophageal reflux disease), History of ETOH abuse, Hyperlipidemia, Obese, Obstructive sleep apnea (02/25/2015), Osteoarthritis, Periodic limb movement disorder (02/25/2014), Pre-diabetes, RLS (restless legs syndrome), Smoker, and Substance abuse ([place]). here with:  1.  Fatigue 2.  Restless leg syndrome 3.  Fibromyalgia  The patient is having significant fatigue. I will check blood work today to rule out other causes.  The patient has gained approximately 13 pounds since her sleep study.  She may need another sleep study to rule out sleep apnea.  The patient will continue on Sinemet.  She is advised that if her symptoms worsen or she develops new symptoms she should let us know.  She will follow-up in 6 months or sooner if needed.   Ward Givens, MSN, NP-C 10/06/2017, 2:08 PM [facility] [address] [place], [place] [address] [phone]

## 2017-10-07 LAB — COMPREHENSIVE METABOLIC PANEL
A/G RATIO: 1.5 (ref 1.2–2.2)
ALBUMIN: 4 g/dL (ref 3.5–5.5)
ALK PHOS: 81 IU/L (ref 39–117)
ALT: 18 IU/L (ref 0–32)
AST: 16 IU/L (ref 0–40)
BILIRUBIN TOTAL: 0.2 mg/dL (ref 0.0–1.2)
BUN / CREAT RATIO: 11 (ref 9–23)
BUN: 6 mg/dL (ref 6–24)
CHLORIDE: 100 mmol/L (ref 96–106)
CO2: 27 mmol/L (ref 20–29)
Calcium: 9.1 mg/dL (ref 8.7–10.2)
Creatinine, Ser: 0.55 mg/dL — ABNORMAL LOW (ref 0.57–1.00)
GFR calc Af Amer: 129 mL/min/{1.73_m2} (ref >59)
GFR calc non Af Amer: 112 mL/min/{1.73_m2} (ref >59)
GLUCOSE: 80 mg/dL (ref 65–99)
Globulin, Total: 2.6 g/dL (ref 1.5–4.5)
POTASSIUM: 4.2 mmol/L (ref 3.5–5.2)
SODIUM: 140 mmol/L (ref 134–144)
Total Protein: 6.6 g/dL (ref 6.0–8.5)

## 2017-10-07 LAB — CBC WITH DIFFERENTIAL/PLATELET
BASOS ABS: 0 10*3/uL (ref 0.0–0.2)
Basos: 0 %
EOS (ABSOLUTE): 0.2 10*3/uL (ref 0.0–0.4)
Eos: 2 %
Hematocrit: 38.9 % (ref 34.0–46.6)
Hemoglobin: 12.7 g/dL (ref 11.1–15.9)
IMMATURE GRANS (ABS): 0 10*3/uL (ref 0.0–0.1)
IMMATURE GRANULOCYTES: 0 %
Lymphocytes Absolute: 2.5 10*3/uL (ref 0.7–3.1)
Lymphs: 28 %
MCH: 28.3 pg (ref 26.6–33.0)
MCHC: 32.6 g/dL (ref 31.5–35.7)
MCV: 87 fL (ref 79–97)
MONOCYTES: 8 %
MONOS ABS: 0.7 10*3/uL (ref 0.1–0.9)
Neutrophils Absolute: 5.5 10*3/uL (ref 1.4–7.0)
Neutrophils: 62 %
PLATELETS: 303 10*3/uL (ref 150–379)
RBC: 4.49 x10E6/uL (ref 3.77–5.28)
RDW: 14.8 % (ref 12.3–15.4)
WBC: 9 10*3/uL (ref 3.4–10.8)

## 2017-10-07 LAB — IRON AND TIBC
Iron Saturation: 18 % (ref 15–55)
Iron: 54 ug/dL (ref 27–159)
Total Iron Binding Capacity: 304 ug/dL (ref 250–450)
UIBC: 250 ug/dL (ref 131–425)

## 2017-10-07 LAB — VITAMIN D 25 HYDROXY (VIT D DEFICIENCY, FRACTURES): VIT D 25 HYDROXY: 24.1 ng/mL — AB (ref 30.0–100.0)

## 2017-10-07 LAB — TSH: TSH: 2.4 u[IU]/mL (ref 0.450–4.500)

## 2017-10-07 LAB — FERRITIN: Ferritin: 151 ng/mL — ABNORMAL HIGH (ref 15–150)

## 2017-10-10 ENCOUNTER — Telehealth: Payer: Self-pay | Admitting: *Deleted

## 2017-10-10 NOTE — Telephone Encounter (Signed)
Spoke with patient and informed her that her labs are unremarkable but Vitamin D level is low.  Advised she should take 800 units cholecalciferol daily. If no improvement in fatigue she should consider repeating the sleep study. Patient stated she has been taking calcium with Vit D 400 units for 5-7 years. She stated that she has met her insurance deductible and would like a sleep study this year if she needs one. She asked how long before her Vit D level would improve her fatigue if that is what helps. This RN advised that there may not be any openings this year for a sleep study. Patient asked this RN to talk with NP about how long she'd have to be on Vit D to see effects and to check on availability of sleep study this year. This RN stated will discuss with Jinny Blossom, and let her know. Patient verbalized understanding, appreciation.

## 2017-10-11 NOTE — Telephone Encounter (Signed)
Despite the patient taking 400 units daily of vitamin D her levels are still low.  She should increase her dose to 800 units daily.  We will recheck a level in 3 months.  However if she is not seeing any improvement in 4-6 weeks we can consider scheduling her for a repeat sleep study.

## 2017-10-11 NOTE — Telephone Encounter (Signed)
Spoke with patient and informed her of NP's reply. Advised she can come into office for lab only in 3 months, no appt needed. She verbalized understanding, agreement, appreciation of call.

## 2017-10-18 ENCOUNTER — Encounter: Payer: Self-pay | Admitting: Neurology

## 2017-10-20 ENCOUNTER — Telehealth: Payer: Self-pay | Admitting: *Deleted

## 2017-10-20 NOTE — Telephone Encounter (Signed)
Per Edman Circle, NP, spoke with patient and advised she buy Vitamin D OTC and take 2000-5000 units daily for one month. She should then call this office and come in for repeat Vit D lab. Advised her that if at that time her level is still low, NP will refer her to her PCP for management of low Vit D.  Patient verbalized understanding, agreement, appreciation of call in reply to her my chart message.

## 2017-10-21 ENCOUNTER — Encounter: Payer: Self-pay | Admitting: Obstetrics & Gynecology

## 2017-10-21 ENCOUNTER — Ambulatory Visit (INDEPENDENT_AMBULATORY_CARE_PROVIDER_SITE_OTHER): Payer: 59 | Admitting: Obstetrics & Gynecology

## 2017-10-21 VITALS — BP 136/80 | Wt 264.0 lb

## 2017-10-21 DIAGNOSIS — N951 Menopausal and female climacteric states: Secondary | ICD-10-CM | POA: Diagnosis not present

## 2017-10-21 DIAGNOSIS — Z01411 Encounter for gynecological examination (general) (routine) with abnormal findings: Secondary | ICD-10-CM

## 2017-10-21 DIAGNOSIS — B373 Candidiasis of vulva and vagina: Secondary | ICD-10-CM | POA: Diagnosis not present

## 2017-10-21 DIAGNOSIS — B3731 Acute candidiasis of vulva and vagina: Secondary | ICD-10-CM

## 2017-10-21 DIAGNOSIS — Z90710 Acquired absence of both cervix and uterus: Secondary | ICD-10-CM | POA: Diagnosis not present

## 2017-10-21 DIAGNOSIS — B372 Candidiasis of skin and nail: Secondary | ICD-10-CM | POA: Diagnosis not present

## 2017-10-21 MED ORDER — FLUCONAZOLE 150 MG PO TABS: 150.0000 mg | ORAL_TABLET | Freq: Every day | ORAL | 1 refills | Status: AC

## 2017-10-21 MED ORDER — NYSTATIN 100000 UNIT/GM EX POWD: Freq: Two times a day (BID) | CUTANEOUS | 2 refills | Status: DC

## 2017-10-21 NOTE — Progress Notes (Signed)
Gloria [name] 1971-02-23 [id number]   History:    46 y.o. G1P1L1 Divorced.    RP:  Established patient presenting for annual gyn exam   HPI: Status post hysterectomy.  Has been very tearful recently with hot flashes and night sweats.  Low energy level.  No pelvic pain.  Abstinent.  Breasts normal.  Urine and bowel movements normal.  Past medical history,surgical history, family history and social history were all reviewed and documented in the EPIC chart.  Gynecologic History Patient's last menstrual period was 04/25/2013. Contraception: status post hysterectomy Last Pap: 2011. Results were: normal Last mammogram: 03/2017. Results were: normal [place] 03/2013  Obstetric History OB History  Gravida Para Term Preterm AB Living  1 1 1     1   SAB TAB Ectopic Multiple Live Births          1    # Outcome Date GA Lbr Len/2nd Weight Sex Delivery Anes PTL Lv  1 Term     M Vag-Spont  N LIV       ROS: A ROS was performed and pertinent positives and negatives are included in the history.  GENERAL: No fevers or chills. HEENT: No change in vision, no earache, sore throat or sinus congestion. NECK: No pain or stiffness. CARDIOVASCULAR: No chest pain or pressure. No palpitations. PULMONARY: No shortness of breath, cough or wheeze. GASTROINTESTINAL: No abdominal pain, nausea, vomiting or diarrhea, melena or bright red blood per rectum. GENITOURINARY: No urinary frequency, urgency, hesitancy or dysuria. MUSCULOSKELETAL: No joint or muscle pain, no back pain, no recent trauma. DERMATOLOGIC: No rash, no itching, no lesions. ENDOCRINE: No polyuria, polydipsia, no heat or cold intolerance. No recent change in weight. HEMATOLOGICAL: No anemia or easy bruising or bleeding. NEUROLOGIC: No headache, seizures, numbness, tingling or weakness. PSYCHIATRIC: No depression, no loss of interest in normal activity or change in sleep pattern.     Exam:   BP 136/80 (BP Location: Right Arm, Patient Position:  Sitting, Cuff Size: Large)   Wt 264 lb (119.7 kg)   LMP 04/25/2013   BMI 43.93 kg/m   Body mass index is 43.93 kg/m.  General appearance : Well developed well nourished female. No acute distress HEENT: Eyes: no retinal hemorrhage or exudates,  Neck supple, trachea midline, no carotid bruits, no thyroidmegaly Lungs: Clear to auscultation, no rhonchi or wheezes, or rib retractions  Heart: Regular rate and rhythm, no murmurs or gallops Breast:Examined in sitting and supine position were symmetrical in appearance, no palpable masses or tenderness,  no skin retraction, no nipple inversion, no nipple discharge, no skin discoloration, no axillary or supraclavicular lymphadenopathy Abdomen: no palpable masses or tenderness, no rebound or guarding Extremities: no edema or skin discoloration or tenderness  Pelvic: Vulva normal, except for yeast like d/c  B[place]olin, Urethra, Skene Glands: Within normal limits             Vagina: No gross lesions.  Discharge c/w yeasts.  Pap reflex done.  Cervix/Uterus Absent  Adnexa  Without masses or tenderness  Anus and perineum  normal   Assessment/Plan:  46 y.o. female for annual exam   1. Encounter for gynecological examination with abnormal finding Gynecologic exam status post hysterectomy.  Pap reflex done.  Breast exam normal.  Screening mammogram May 2018 normal.  Colonoscopy May 2014.  Fasting health labs with family physician.  2. Hot flushes, perimenopausal Tearful, hot flushes, night sweat and low energy level.  If menopause is diagnosed with high FSH, will follow up  to discuss hormone replacement therapy. - FSH  3. H/O total hysterectomy  4. Yeast infection involving the vagina and surrounding area Yeast vaginitis and vulvitis.  Will treat with fluconazole 150 mg 1 tablet per mouth daily times 3 days.  Usage discussed.  5. Yeast infection of the skin Will use nystatin powder and temp area of panicle as needed.  Counseling on above issues  >50% x 10 minutes.  Princess Bruins MD, 4:24 PM 10/21/2017

## 2017-10-21 NOTE — Patient Instructions (Signed)
1. Encounter for gynecological examination with abnormal finding Gynecologic exam status post hysterectomy.  Pap reflex done.  Breast exam normal.  Screening mammogram May 2018 normal.  Colonoscopy May 2014.  Fasting health labs with family physician.  2. Hot flushes, perimenopausal Tearful, hot flushes, night sweat and low energy level.  If menopause is diagnosed with high FSH, will follow up to discuss hormone replacement therapy. - FSH  3. H/O total hysterectomy  4. Yeast infection involving the vagina and surrounding area Yeast vaginitis and vulvitis.  Will treat with fluconazole 150 mg 1 tablet per mouth daily times 3 days.  Usage discussed.  5. Yeast infection of the skin Will use nystatin powder and temp area of panicle as needed.  He it was a pleasure meeting you today!  I will inform you of your results as soon as available.   Vaginal Yeast infection, Adult Vaginal yeast infection is a condition that causes soreness, swelling, and redness (inflammation) of the vagina. It also causes vaginal discharge. This is a common condition. Some women get this infection frequently. What are the causes? This condition is caused by a change in the normal balance of the yeast (candida) and bacteria that live in the vagina. This change causes an overgrowth of yeast, which causes the inflammation. What increases the risk? This condition is more likely to develop in:  Women who take antibiotic medicines.  Women who have diabetes.  Women who take birth control pills.  Women who are pregnant.  Women who douche often.  Women who have a weak defense (immune) system.  Women who have been taking steroid medicines for a long time.  Women who frequently wear tight clothing.  What are the signs or symptoms? Symptoms of this condition include:  White, thick vaginal discharge.  Swelling, itching, redness, and irritation of the vagina. The lips of the vagina (vulva) may be affected as  well.  Pain or a burning feeling while urinating.  Pain during sex.  How is this diagnosed? This condition is diagnosed with a medical history and physical exam. This will include a pelvic exam. Your health care provider will examine a sample of your vaginal discharge under a microscope. Your health care provider may send this sample for testing to confirm the diagnosis. How is this treated? This condition is treated with medicine. Medicines may be over-the-counter or prescription. You may be told to use one or more of the following:  Medicine that is taken orally.  Medicine that is applied as a cream.  Medicine that is inserted directly into the vagina (suppository).  Follow these instructions at home:  Take or apply over-the-counter and prescription medicines only as told by your health care provider.  Do not have sex until your health care provider has approved. Tell your sex partner that you have a yeast infection. That person should go to his or her health care provider if he or she develops symptoms.  Do not wear tight clothes, such as pantyhose or tight pants.  Avoid using tampons until your health care provider approves.  Eat more yogurt. This may help to keep your yeast infection from returning.  Try taking a sitz bath to help with discomfort. This is a warm water bath that is taken while you are sitting down. The water should only come up to your hips and should cover your buttocks. Do this 3-4 times per day or as told by your health care provider.  Do not douche.  Wear breathable, cotton underwear.  If you have diabetes, keep your blood sugar levels under control. Contact a health care provider if:  You have a fever.  Your symptoms go away and then return.  Your symptoms do not get better with treatment.  Your symptoms get worse.  You have new symptoms.  You develop blisters in or around your vagina.  You have blood coming from your vagina and it is not  your menstrual period.  You develop pain in your abdomen. This information is not intended to replace advice given to you by your health care provider. Make sure you discuss any questions you have with your health care provider. Document Released: 08/18/2005 Document Revised: 04/21/2016 Document Reviewed: 05/12/2015 Elsevier Interactive Patient Education  2018 [employer].

## 2017-10-21 NOTE — Progress Notes (Signed)
l °

## 2017-10-22 LAB — FOLLICLE STIMULATING HORMONE: FSH: 23.8 m[IU]/mL

## 2017-10-25 LAB — PAP IG W/ RFLX HPV ASCU

## 2017-10-28 ENCOUNTER — Encounter: Payer: Self-pay | Admitting: Obstetrics & Gynecology

## 2017-10-28 ENCOUNTER — Ambulatory Visit: Payer: 59 | Admitting: Obstetrics & Gynecology

## 2017-10-28 DIAGNOSIS — R7989 Other specified abnormal findings of blood chemistry: Secondary | ICD-10-CM | POA: Diagnosis not present

## 2017-10-28 DIAGNOSIS — Z90710 Acquired absence of both cervix and uterus: Secondary | ICD-10-CM

## 2017-10-28 DIAGNOSIS — E8940 Asymptomatic postprocedural ovarian failure: Secondary | ICD-10-CM | POA: Diagnosis not present

## 2017-10-28 MED ORDER — ESTRADIOL 0.05 MG/24HR TD PTWK: 0.0500 mg | MEDICATED_PATCH | TRANSDERMAL | 4 refills | Status: DC

## 2017-10-28 NOTE — Progress Notes (Signed)
    Gloria [name] [birth year] [id number]        46 y.o.  G1P1L1 Divorced  RP:  Symptomatic menopause to discuss HRT  HPI:  No change x last visit 10/21/2017:  Status post hysterectomy.  Has been very tearful recently with mild hot flashes and night sweats.  Low energy level.  No symptoms of major depression.  No suicidal ideation.   Past medical history,surgical history, problem list, medications, allergies, family history and social history were all reviewed and documented in the EPIC chart.  Directed ROS with pertinent positives and negatives documented in the history of present illness/assessment and plan.  Exam:  There were no vitals filed for this visit. General appearance:  Normal  FSH 23.8 on 10/21/2017   Assessment/Plan:  46 y.o. G1P1001   1. Post hysterectomy menopause syndrome Diagnosis, risks and symptoms associated with menopause thoroughly discussed.  Increased rate of bone loss reviewed with the risk of osteoporosis and fractures.  The effect of menopause on cardiovascular risks including myocardial infarction reviewed.  The effect of menopause on skin and moods also reviewed.  The benefits of hormone replacement therapy discussed.  Benefits include cardiovascular protection with decreased risk of myocardial infarction, slower rate of bone loss to decrease the risk of osteoporosis and bone fracture in the future.  Protective effect on skin which improves sexual activity.  Control of menopausal symptoms to improve sleep, energy level and moods.  Patient explained that would need only the estrogen part of hormone replacement therapy given that she is status post hysterectomy and therefore no need for progesterone to protect the endometrium. Long-term risk of breast cancer after 10 years of use reviewed.  The risk of blood clots including strokes and pulmonary embolism reviewed. After reviewing usage of estrogen hormone replacement therapy with its risks and benefits, patient  decides to start estradiol patch.  The benefit of the patch not causing the first pass hepatic metabolism explained, patient appreciates that given that she is a recovered alcoholic.  Will start with a dose of estradiol patch at that 0.05 and patient will call back if needs a different dosage based on symptoms. - estradiol (CLIMARA - DOSED IN MG/24 HR) 0.05 mg/24hr patch; Place 1 patch (0.05 mg total) onto the skin once a week.  2. Low vitamin D level Vitamin D supplements per results. - Vitamin D 1,25 dihydroxy; Future  Counseling on above issues >50% x 25 minutes.  Princess Bruins MD, 11:53 AM 10/28/2017

## 2017-10-30 ENCOUNTER — Encounter: Payer: Self-pay | Admitting: Obstetrics & Gynecology

## 2017-10-30 NOTE — Patient Instructions (Signed)
1. Post hysterectomy menopause syndrome Diagnosis, risks and symptoms associated with menopause thoroughly discussed.  Increased rate of bone loss reviewed with the risk of osteoporosis and fractures.  The effect of menopause on cardiovascular risks including myocardial infarction reviewed.  The effect of menopause on skin and moods also reviewed.  The benefits of hormone replacement therapy discussed.  Benefits include cardiovascular protection with decreased risk of myocardial infarction, slower rate of bone loss to decrease the risk of osteoporosis and bone fracture in the future.  Protective effect on skin which improves sexual activity.  Control of menopausal symptoms to improve sleep, energy level and moods.  Patient explained that would need only the estrogen part of hormone replacement therapy given that she is status post hysterectomy and therefore no need for progesterone to protect the endometrium. Long-term risk of breast cancer after 10 years of use reviewed.  The risk of blood clots including strokes and pulmonary embolism reviewed. After reviewing usage of estrogen hormone replacement therapy with its risks and benefits, patient decides to start estradiol patch.  The benefit of the patch not causing the first pass hepatic metabolism explained, patient appreciates that given that she is a recovered alcoholic.  Will start with a dose of estradiol patch at that 0.05 and patient will call back if needs a different dosage based on symptoms. - estradiol (CLIMARA - DOSED IN MG/24 HR) 0.05 mg/24hr patch; Place 1 patch (0.05 mg total) onto the skin once a week.  2. Low vitamin D level Vitamin D supplements per results. - Vitamin D 1,25 dihydroxy; Future  Gloria [name], it was a pleasure seeing you today!  I will inform you of your vitamin D level as soon as available.   Menopause and Hormone Replacement Therapy What is hormone replacement therapy? Hormone replacement therapy (HRT) is the use of  artificial (synthetic) hormones to replace hormones that your body stops producing during menopause. Menopause is the normal time of life when menstrual periods stop completely and the ovaries stop producing the female hormones estrogen and progesterone. This lack of hormones can affect your health and cause undesirable symptoms. HRT can relieve some of those symptoms. What are my options for HRT? HRT may consist of the synthetic hormones estrogen and progestin, or it may consist of only estrogen (estrogen-only therapy). You and your health care provider will decide which form of HRT is best for you. If you choose to be on HRT and you have a uterus, estrogen and progestin are usually prescribed. Estrogen-only therapy is used for women who do not have a uterus. Possible options for taking HRT include:  Pills.  Patches.  Gels.  Sprays.  Vaginal cream.  Vaginal rings.  Vaginal inserts.  The amount of hormone(s) that you take and how long you take the hormone(s) varies depending on your individual health. It is important to:  Begin HRT with the lowest possible dosage.  Stop HRT as soon as your health care provider tells you to stop.  Work with your health care provider so that you feel informed and comfortable with your decisions.  What are the benefits of HRT? HRT can reduce the frequency and severity of menopausal symptoms. Benefits of HRT vary depending on the menopausal symptoms that you have, the severity of your symptoms, and your overall health. HRT may help to improve the following menopausal symptoms:  Hot flashes and night sweats. These are sudden feelings of heat that spread over the face and body. The skin may turn red, like a  blush. Night sweats are hot flashes that happen while you are sleeping or trying to sleep.  Bone loss (osteoporosis). The body loses calcium more quickly after menopause, causing the bones to become weaker. This can increase the risk for bone breaks  (fractures).  Vaginal dryness. The lining of the vagina can become thin and dry, which can cause pain during sexual intercourse or cause infection, burning, or itching.  Urinary tract infections.  Urinary incontinence. This is a decreased ability to control when you urinate.  Irritability.  Short-term memory problems.  What are the risks of HRT? Risks of HRT vary depending on your individual health and medical history. Risks of HRT also depend on whether you receive both estrogen and progestin or you receive estrogen only.HRT may increase the risk of:  Spotting. This is when a small amount of bloodleaks from the vagina unexpectedly.  Endometrial cancer. This cancer is in the lining of the uterus (endometrium).  Breast cancer.  Increased density of breast tissue. This can make it harder to find breast cancer on a breast X-ray (mammogram).  Stroke.  Heart attack.  Blood clots.  Gallbladder disease.  Risks of HRT can increase if you have any of the following conditions:  Endometrial cancer.  Liver disease.  Heart disease.  Breast cancer.  History of blood clots.  History of stroke.  How should I care for myself while I am on HRT?  Take over-the-counter and prescription medicines only as told by your health care provider.  Get mammograms, pelvic exams, and medical checkups as often as told by your health care provider.  Have Pap tests done as often as told by your health care provider. A Pap test is sometimes called a Pap smear. It is a screening test that is used to check for signs of cancer of the cervix and vagina. A Pap test can also identify the presence of infection or precancerous changes. Pap tests may be done: ? Every 3 years, starting at age 5. ? Every 5 years, starting after age 98, in combination with testing for human papillomavirus (HPV). ? More often or less often depending on other medical conditions you have, your age, and other risk  factors.  It is your responsibility to get your Pap test results. Ask your health care provider or the department performing the test when your results will be ready.  Keep all follow-up visits as told by your health care provider. This is important. When should I seek medical care? Talk with your health care provider if:  You have any of these: ? Pain or swelling in your legs. ? Shortness of breath. ? Chest pain. ? Lumps or changes in your breasts or armpits. ? Slurred speech. ? Pain, burning, or bleeding when you urine.  You develop any of these: ? Unusual vaginal bleeding. ? Dizziness or headaches. ? Weakness or numbness in any part of your arms or legs. ? Pain in your abdomen.  This information is not intended to replace advice given to you by your health care provider. Make sure you discuss any questions you have with your health care provider. Document Released: 08/07/2003 Document Revised: 10/05/2016 Document Reviewed: 05/12/2015 Elsevier Interactive Patient Education  2017 [employer].

## 2018-01-05 ENCOUNTER — Other Ambulatory Visit: Payer: 59

## 2018-01-11 ENCOUNTER — Telehealth: Payer: Self-pay | Admitting: *Deleted

## 2018-01-11 ENCOUNTER — Other Ambulatory Visit: Payer: Self-pay | Admitting: Adult Health

## 2018-01-11 ENCOUNTER — Other Ambulatory Visit (INDEPENDENT_AMBULATORY_CARE_PROVIDER_SITE_OTHER): Payer: Self-pay

## 2018-01-11 DIAGNOSIS — E559 Vitamin D deficiency, unspecified: Secondary | ICD-10-CM

## 2018-01-11 DIAGNOSIS — Z0289 Encounter for other administrative examinations: Secondary | ICD-10-CM

## 2018-01-11 NOTE — Telephone Encounter (Signed)
Pt came to office to have Vit D labwork completed. No lab order was in. Spoke with MM,NP who placed Vit D lab. Pt was already gone.  Per MM,NP, if Vit D level comes back low, we will refer her back to PCP for management.

## 2018-01-12 ENCOUNTER — Telehealth: Payer: Self-pay | Admitting: *Deleted

## 2018-01-12 DIAGNOSIS — E559 Vitamin D deficiency, unspecified: Secondary | ICD-10-CM

## 2018-01-12 LAB — VITAMIN D 25 HYDROXY (VIT D DEFICIENCY, FRACTURES): Vit D, 25-Hydroxy: 29.8 ng/mL — ABNORMAL LOW (ref 30.0–100.0)

## 2018-01-12 NOTE — Telephone Encounter (Signed)
Pt said she was taking 3,000 unit vitamin d , I advised increase to 1,000 more. She asked if vitamin d level should be repeated? I told her typically repeat in 6 month. This correct?

## 2018-01-12 NOTE — Telephone Encounter (Signed)
LVM informing patient her vitamin D level is only slightly improved and remains low.  Advised her she should consult with her PCP to manage her Vit D level.  Left number for any questions.

## 2018-01-12 NOTE — Telephone Encounter (Signed)
Pt had vitamin d level rechecked yesterday at another MD office, result is abnormal. Please advise

## 2018-01-12 NOTE — Telephone Encounter (Signed)
Vit D at 29.8, is just barely below the normal range and improved from 3 months ago.  How much supplement is she taking?  Will recommend an increase from that.Marland KitchenMarland Kitchen

## 2018-01-13 NOTE — Telephone Encounter (Signed)
Yes completely agree.  Thanks

## 2018-01-13 NOTE — Telephone Encounter (Signed)
Left on voicemail, order placed, and recall placed.

## 2018-01-27 ENCOUNTER — Ambulatory Visit (INDEPENDENT_AMBULATORY_CARE_PROVIDER_SITE_OTHER): Payer: 59 | Admitting: Obstetrics & Gynecology

## 2018-01-27 ENCOUNTER — Encounter: Payer: Self-pay | Admitting: Obstetrics & Gynecology

## 2018-01-27 VITALS — BP 128/80 | Temp 98.6°F

## 2018-01-27 DIAGNOSIS — B373 Candidiasis of vulva and vagina: Secondary | ICD-10-CM

## 2018-01-27 DIAGNOSIS — B3731 Acute candidiasis of vulva and vagina: Secondary | ICD-10-CM

## 2018-01-27 DIAGNOSIS — B9689 Other specified bacterial agents as the cause of diseases classified elsewhere: Secondary | ICD-10-CM

## 2018-01-27 DIAGNOSIS — N760 Acute vaginitis: Secondary | ICD-10-CM | POA: Diagnosis not present

## 2018-01-27 DIAGNOSIS — B372 Candidiasis of skin and nail: Secondary | ICD-10-CM

## 2018-01-27 LAB — WET PREP FOR TRICH, YEAST, CLUE

## 2018-01-27 MED ORDER — TERCONAZOLE 0.8 % VA CREA: 1.0000 | TOPICAL_CREAM | Freq: Every day | VAGINAL | 3 refills | Status: AC

## 2018-01-27 MED ORDER — METRONIDAZOLE 0.75 % VA GEL: 1.0000 | Freq: Every day | VAGINAL | 0 refills | Status: AC

## 2018-01-27 MED ORDER — FLUCONAZOLE 150 MG PO TABS: 150.0000 mg | ORAL_TABLET | Freq: Every day | ORAL | 0 refills | Status: AC

## 2018-01-27 NOTE — Progress Notes (Signed)
    Gloria [name] 1971-11-04 [id number]        47 y.o.  G1P1001   RP: Worsening itching and rash of inner thighs Rt>Lt and Rt ear  HPI: Vaginal discharge with vaginal itching, now worsening rash and itching of Rt inner thigh and right ear.  Per patient, she tends to sleep on the right side and feels that she has been scratching both at the right inner thigh and behind her right ear.   OB History  Gravida Para Term Preterm AB Living  1 1 1     1   SAB TAB Ectopic Multiple Live Births          1    # Outcome Date GA Lbr Len/2nd Weight Sex Delivery Anes PTL Lv  1 Term     M Vag-Spont  N LIV      Past medical history,surgical history, problem list, medications, allergies, family history and social history were all reviewed and documented in the EPIC chart.   Directed ROS with pertinent positives and negatives documented in the history of present illness/assessment and plan.  Exam:  Vitals:   01/27/18 1215  BP: 128/80   General appearance:  Normal  Rt ear:  Rash behing the ear lobe  Gynecologic exam: Bilateral rash at the inner thigh and inguinal area worse on the right side.  Vulva otherwise normal.  Speculum exam: Increased vaginal discharge.  Wet prep done.  Cervix and vagina normal.  Wet prep:  Clue cells present   Assessment/Plan:  47 y.o. G1P1001   1. Yeast vaginitis Clinically yeast vaginitis although the wet prep showed only clue cells.  Treat with fluconazole 1 tablet per mouth daily for 3 days.  Use terconazole cream externally. - WET PREP FOR [place], YEAST, CLUE  2. Yeast infection of the skin Probably yeast infection of the skin behind on the right earlobe.  Will treat with terconazole cream that she will apply behind the right earlobe.  3. Bacterial vaginosis Wet prep positive for clue cells.  Bacterial vaginosis.  Will treat with metronidazole gel intravaginally at bedtime for 5 days first.  Usage reviewed.  Will then follow with treatment of the yeast  vaginitis and the yeast infection of the skin.  Other orders - fluconazole (DIFLUCAN) 150 MG tablet; Take 1 tablet (150 mg total) by mouth daily for 3 days. - terconazole (TERAZOL 3) 0.8 % vaginal cream; Place 1 applicator vaginally at bedtime for 3 days. Also put a thin layer of cream on affected skin of right ear and inner thighs x 3 days. - metroNIDAZOLE (METROGEL) 0.75 % vaginal gel; Place 1 Applicatorful vaginally at bedtime for 5 days.  Counseling on above issues more than 50% for 15 minutes.  Princess Bruins MD, 12:20 PM 01/27/2018

## 2018-01-29 NOTE — Patient Instructions (Addendum)
1. Yeast vaginitis Clinically yeast vaginitis although the wet prep showed only clue cells.  Treat with fluconazole 1 tablet per mouth daily for 3 days.  Use terconazole cream externally. - WET PREP FOR [place], YEAST, CLUE  2. Yeast infection of the skin Probably yeast infection of the skin behind on the right earlobe.  Will treat with terconazole cream that she will apply behind the right earlobe.  3. Bacterial vaginosis Wet prep positive for clue cells.  Bacterial vaginosis.  Will treat with metronidazole gel intravaginally at bedtime for 5 days first.  Usage reviewed.  Will then follow with treatment of the yeast vaginitis and the yeast infection of the skin.  Other orders - fluconazole (DIFLUCAN) 150 MG tablet; Take 1 tablet (150 mg total) by mouth daily for 3 days. - terconazole (TERAZOL 3) 0.8 % vaginal cream; Place 1 applicator vaginally at bedtime for 3 days. Also put a thin layer of cream on affected skin of right ear and inner thighs x 3 days. - metroNIDAZOLE (METROGEL) 0.75 % vaginal gel; Place 1 Applicatorful vaginally at bedtime for 5 days.  Adrine, good seeing you today!   Vaginal Yeast infection, Adult Vaginal yeast infection is a condition that causes soreness, swelling, and redness (inflammation) of the vagina. It also causes vaginal discharge. This is a common condition. Some women get this infection frequently. What are the causes? This condition is caused by a change in the normal balance of the yeast (candida) and bacteria that live in the vagina. This change causes an overgrowth of yeast, which causes the inflammation. What increases the risk? This condition is more likely to develop in:  Women who take antibiotic medicines.  Women who have diabetes.  Women who take birth control pills.  Women who are pregnant.  Women who douche often.  Women who have a weak defense (immune) system.  Women who have been taking steroid medicines for a long time.  Women who  frequently wear tight clothing.  What are the signs or symptoms? Symptoms of this condition include:  White, thick vaginal discharge.  Swelling, itching, redness, and irritation of the vagina. The lips of the vagina (vulva) may be affected as well.  Pain or a burning feeling while urinating.  Pain during sex.  How is this diagnosed? This condition is diagnosed with a medical history and physical exam. This will include a pelvic exam. Your health care provider will examine a sample of your vaginal discharge under a microscope. Your health care provider may send this sample for testing to confirm the diagnosis. How is this treated? This condition is treated with medicine. Medicines may be over-the-counter or prescription. You may be told to use one or more of the following:  Medicine that is taken orally.  Medicine that is applied as a cream.  Medicine that is inserted directly into the vagina (suppository).  Follow these instructions at home:  Take or apply over-the-counter and prescription medicines only as told by your health care provider.  Do not have sex until your health care provider has approved. Tell your sex partner that you have a yeast infection. That person should go to his or her health care provider if he or she develops symptoms.  Do not wear tight clothes, such as pantyhose or tight pants.  Avoid using tampons until your health care provider approves.  Eat more yogurt. This may help to keep your yeast infection from returning.  Try taking a sitz bath to help with discomfort. This is  a warm water bath that is taken while you are sitting down. The water should only come up to your hips and should cover your buttocks. Do this 3-4 times per day or as told by your health care provider.  Do not douche.  Wear breathable, cotton underwear.  If you have diabetes, keep your blood sugar levels under control. Contact a health care provider if:  You have a  fever.  Your symptoms go away and then return.  Your symptoms do not get better with treatment.  Your symptoms get worse.  You have new symptoms.  You develop blisters in or around your vagina.  You have blood coming from your vagina and it is not your menstrual period.  You develop pain in your abdomen. This information is not intended to replace advice given to you by your health care provider. Make sure you discuss any questions you have with your health care provider. Document Released: 08/18/2005 Document Revised: 04/21/2016 Document Reviewed: 05/12/2015 Elsevier Interactive Patient Education  2018 [employer].  Bacterial Vaginosis Bacterial vaginosis is a vaginal infection that occurs when the normal balance of bacteria in the vagina is disrupted. It results from an overgrowth of certain bacteria. This is the most common vaginal infection among women ages 48-44. Because bacterial vaginosis increases your risk for STIs (sexually transmitted infections), getting treated can help reduce your risk for chlamydia, gonorrhea, herpes, and HIV (human immunodeficiency virus). Treatment is also important for preventing complications in pregnant women, because this condition can cause an early (premature) delivery. What are the causes? This condition is caused by an increase in harmful bacteria that are normally present in small amounts in the vagina. However, the reason that the condition develops is not fully understood. What increases the risk? The following factors may make you more likely to develop this condition:  Having a new sexual partner or multiple sexual partners.  Having unprotected sex.  Douching.  Having an intrauterine device (IUD).  Smoking.  Drug and alcohol abuse.  Taking certain antibiotic medicines.  Being pregnant.  You cannot get bacterial vaginosis from toilet seats, bedding, swimming pools, or contact with objects around you. What are the signs or  symptoms? Symptoms of this condition include:  Grey or white vaginal discharge. The discharge can also be watery or foamy.  A fish-like odor with discharge, especially after sexual intercourse or during menstruation.  Itching in and around the vagina.  Burning or pain with urination.  Some women with bacterial vaginosis have no signs or symptoms. How is this diagnosed? This condition is diagnosed based on:  Your medical history.  A physical exam of the vagina.  Testing a sample of vaginal fluid under a microscope to look for a large amount of bad bacteria or abnormal cells. Your health care provider may use a cotton swab or a small wooden spatula to collect the sample.  How is this treated? This condition is treated with antibiotics. These may be given as a pill, a vaginal cream, or a medicine that is put into the vagina (suppository). If the condition comes back after treatment, a second round of antibiotics may be needed. Follow these instructions at home: Medicines  Take over-the-counter and prescription medicines only as told by your health care provider.  Take or use your antibiotic as told by your health care provider. Do not stop taking or using the antibiotic even if you start to feel better. General instructions  If you have a female sexual partner, tell her  that you have a vaginal infection. She should see her health care provider and be treated if she has symptoms. If you have a female sexual partner, he does not need treatment.  During treatment: ? Avoid sexual activity until you finish treatment. ? Do not douche. ? Avoid alcohol as directed by your health care provider. ? Avoid breastfeeding as directed by your health care provider.  Drink enough water and fluids to keep your urine clear or pale yellow.  Keep the area around your vagina and rectum clean. ? Wash the area daily with warm water. ? Wipe yourself from front to back after using the toilet.  Keep all  follow-up visits as told by your health care provider. This is important. How is this prevented?  Do not douche.  Wash the outside of your vagina with warm water only.  Use protection when having sex. This includes latex condoms and dental dams.  Limit how many sexual partners you have. To help prevent bacterial vaginosis, it is best to have sex with just one partner (monogamous).  Make sure you and your sexual partner are tested for STIs.  Wear cotton or cotton-lined underwear.  Avoid wearing tight pants and pantyhose, especially during summer.  Limit the amount of alcohol that you drink.  Do not use any products that contain nicotine or tobacco, such as cigarettes and e-cigarettes. If you need help quitting, ask your health care provider.  Do not use illegal drugs. Where to find more information:  Centers for Disease Control and Prevention: AppraiserFraud.fi  American Sexual Health Association (ASHA): www.ashastd.org  U.S. Department of Health and Financial controller, Office on Women's Health: DustingSprays.pl or SecuritiesCard.it Contact a health care provider if:  Your symptoms do not improve, even after treatment.  You have more discharge or pain when urinating.  You have a fever.  You have pain in your abdomen.  You have pain during sex.  You have vaginal bleeding between periods. Summary  Bacterial vaginosis is a vaginal infection that occurs when the normal balance of bacteria in the vagina is disrupted.  Because bacterial vaginosis increases your risk for STIs (sexually transmitted infections), getting treated can help reduce your risk for chlamydia, gonorrhea, herpes, and HIV (human immunodeficiency virus). Treatment is also important for preventing complications in pregnant women, because the condition can cause an early (premature) delivery.  This condition is treated with antibiotic medicines. These may be given as a  pill, a vaginal cream, or a medicine that is put into the vagina (suppository). This information is not intended to replace advice given to you by your health care provider. Make sure you discuss any questions you have with your health care provider. Document Released: 11/08/2005 Document Revised: 03/14/2017 Document Reviewed: 07/24/2016 Elsevier Interactive Patient Education  [employer].

## 2018-02-03 ENCOUNTER — Ambulatory Visit: Payer: 59 | Admitting: Obstetrics & Gynecology

## 2018-04-13 ENCOUNTER — Encounter: Payer: Self-pay | Admitting: *Deleted

## 2018-04-13 ENCOUNTER — Ambulatory Visit: Payer: 59 | Admitting: Neurology

## 2018-04-13 ENCOUNTER — Encounter: Payer: Self-pay | Admitting: Neurology

## 2018-04-13 VITALS — BP 119/69 | HR 83 | Ht 65.0 in | Wt 269.4 lb

## 2018-04-13 DIAGNOSIS — G4761 Periodic limb movement disorder: Secondary | ICD-10-CM

## 2018-04-13 DIAGNOSIS — M797 Fibromyalgia: Secondary | ICD-10-CM

## 2018-04-13 NOTE — Progress Notes (Signed)
Reason for visit: Periodic limb movements, chronic fatigue  Gloria [name] is an 47 y.o. female  History of present illness:  Gloria [name] is a 47 year old right-handed white female with a history of chronic fatigue.  She has been placed on the estrogen patch and vitamin D supplementation which has helped her fatigue, cutting back on the gabapentin has also helped.  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  She indicates that her restless legs and periodic limb movements have improved on the Sinemet dosing at night.  The patient is waking up relatively frequently feeling as if she cannot breathe, this generally happens when she rolls on her back.  She is able to sleep fairly well if she can stay on her side.  The patient otherwise does not note any new medical issues that have come up since last seen.  She returns for an evaluation.  Past Medical History:  Diagnosis Date  . Allergy   . Anemia   . Anxiety   . Arthritis   . Bulging disc    X 2  . Chronic active hepatitis ([place])   . Chronic back pain 02/25/2015  . Chronic headaches   . Depression   . Diverticulosis   . Elevated LFTs   . Fibromyalgia   . GERD (gastroesophageal reflux disease)   . History of ETOH abuse   . Hyperlipidemia   . Obese   . Obstructive sleep apnea 02/25/2015  . Osteoarthritis    lower back  . Periodic limb movement disorder 02/25/2014  . Pre-diabetes    pt taking metformin  . RLS (restless legs syndrome)   . Smoker   . Substance abuse [facility])     Past Surgical History:  Procedure Laterality Date  . CERVICAL FUSION    . CYSTOSCOPY  06/06/2013   Procedure: CYSTOSCOPY;  Surgeon: Terrance Mass, MD;  Location: [facility];  Service: Gynecology;;  . INTRAUTERINE DEVICE INSERTION  08/09/2008   PARAGUARD  . kidney surgery  1995   abcess  . LAPAROSCOPY N/A 06/06/2013   Procedure: LAPAROSCOPY DIAGNOSTIC;  Surgeon: Terrance Mass, MD;  Location: [facility];   Service: Gynecology;  Laterality: N/A;  . LAPAROTOMY  06/06/2013   Procedure: EXPLORATORY LAPAROTOMY;  Surgeon: Terrance Mass, MD;  Location: [facility];  Service: Gynecology;;  . radial tunnel Bilateral 2005  . VAGINAL HYSTERECTOMY Left 06/06/2013   Procedure: Vaginal Hysterectomy with Patiial Right Salpingectomy;  Surgeon: Terrance Mass, MD;  Location: [facility];  Service: Gynecology;  Laterality: Left;    Family History  Problem Relation Age of Onset  . Breast cancer Mother   . Colon polyps Father   . Diabetes Father   . Cirrhosis Father   . Irritable bowel syndrome Sister   . Breast cancer Maternal Aunt   . Breast cancer Paternal Grandmother   . Colon cancer Paternal Grandmother   . Esophageal cancer Paternal Grandfather   . Stomach cancer Paternal Grandfather     Social history:  reports that she quit smoking about 6 years ago. She smoked 1.00 pack per day. She has never used smokeless tobacco. She reports that she does not drink alcohol or use drugs.    Allergies  Allergen Reactions  . Benadryl [Diphenhydramine Hcl] Hives  . Nuvigil [Armodafinil]     migraines   . Requip [Ropinirole Hcl]     Increased headache    Medications:  Prior to Admission medications  Medication Sig Start Date End Date Taking? Authorizing Provider  B Complex Vitamins (B COMPLEX PO) Take 1 tablet by mouth daily.   Yes [provider]  Calcium Carbonate-Vitamin D (CALCIUM 600/VITAMIN D) 600-400 MG-UNIT per tablet Take 1 tablet by mouth 2 (two) times daily.    Yes [provider]  Carbidopa-Levodopa ER (SINEMET CR) 25-100 MG tablet controlled release TAKE 1 TABLET BY MOUTH AT BEDTIME. 08/22/17  Yes Kathrynn Ducking, MD  Cholecalciferol (VITAMIN D PO) Take by mouth. Takes 5000units  By mouth daily.   Yes [provider]  Coenzyme Q10 (COQ10) 100 MG CAPS Take 100 mg by mouth daily.   Yes [provider]  estradiol (CLIMARA - DOSED IN MG/24 HR) 0.05 mg/24hr patch  Place 1 patch (0.05 mg total) onto the skin once a week. 10/28/17  Yes Princess Bruins, MD  lithium carbonate 300 MG capsule Take 300 mg by mouth 2 (two) times daily with a meal.   Yes [provider]  loratadine (CLARITIN) 10 MG tablet Take 10 mg by mouth daily.   Yes [provider]  LORazepam (ATIVAN) 1 MG tablet Take 1 mg by mouth at bedtime.    Yes [provider]  Multiple Vitamins-Minerals (MULTIVITAMIN PO) Take 1 tablet by mouth daily.    Yes [provider]  nystatin (MYCOSTATIN/NYSTOP) powder Apply topically 2 (two) times daily. 10/21/17  Yes Princess Bruins, MD  OMEGA 3-6-9 FATTY ACIDS PO Take 500 mg by mouth daily.   Yes [provider]  OXcarbazepine ER (OXTELLAR XR) 600 MG TB24 Take 1.5 tablets at bedtime by mouth.    Yes [provider]  PROAIR HFA 108 (90 BASE) MCG/ACT inhaler Inhale 1 puff into the lungs every 4 (four) hours as needed for wheezing or shortness of breath.  08/23/13  Yes [provider]  PROTONIX 40 MG tablet Take 1 capsule by mouth daily. 11/16/16  Yes [provider]  REXULTI 3 MG TABS Take 1 tablet by mouth daily. 11/04/15  Yes [provider]  rizatriptan (MAXALT) 10 MG tablet Take 10 mg by mouth as needed for migraine. May repeat in 2 hours if needed   Yes [provider]  thiamine (VITAMIN B-1) 50 MG tablet Take 50 mg by mouth daily.   Yes [provider]  vitamin C (ASCORBIC ACID) 500 MG tablet Take 500 mg by mouth daily.   Yes [provider]    ROS:  Out of a complete 14 system review of symptoms, the patient complains only of the following symptoms, and all other reviewed systems are negative.  Leg swelling Black stools, diarrhea Sleep apnea, snoring Environmental allergies Depression, anxiety  Blood pressure 119/69, pulse 83, height 5' 5"  (1.651 m), weight 269 lb 6.4 oz (122.2 kg), last menstrual period 04/25/2013.  Physical  Exam  General: The patient is alert and cooperative at the time of the examination.  The patient is markedly obese.  Skin: No significant peripheral edema is noted.   Neurologic Exam  Mental status: The patient is alert and oriented x 3 at the time of the examination. The patient has apparent normal recent and remote memory, with an apparently normal attention span and concentration ability.   Cranial nerves: Facial symmetry is present. Speech is normal, no aphasia or dysarthria is noted. Extraocular movements are full. Visual fields are full.  Motor: The patient has good strength in all 4 extremities.  Sensory examination: Soft touch sensation is symmetric on the face,  arms, and legs.  Coordination: The patient has good finger-nose-finger and heel-to-shin bilaterally.  Gait and station: The patient has a normal gait. Tandem gait is normal. Romberg is negative. No drift is seen.  Reflexes: Deep tendon reflexes are symmetric.   Assessment/Plan:  1.  Obesity  2.  Chronic fatigue  3.  Periodic limb movements  The patient will continue the Sinemet.  We talked about potentially getting a sleep evaluation for the episodes of sleep apnea that she is noting when she rolls back on her back at night.  The patient needs a regular exercise program and weight loss program.  I have recommended a low carbohydrate diet and regular physical activity.  The patient is willing to try this for 6 months and she will contact our office if the episodes of sleep apnea at night continue or worsen.  The patient has been gaining weight over the last several years.  She will follow-up in 1 year otherwise.  Jill Alexanders MD 04/13/2018 4:06 PM  [facility] [address] [place], [place] [address]  Phone [phone] Fax [phone]

## 2018-04-26 ENCOUNTER — Other Ambulatory Visit: Payer: Self-pay | Admitting: Obstetrics & Gynecology

## 2018-04-26 DIAGNOSIS — Z1231 Encounter for screening mammogram for malignant neoplasm of breast: Secondary | ICD-10-CM

## 2018-05-18 ENCOUNTER — Ambulatory Visit
Admission: RE | Admit: 2018-05-18 | Discharge: 2018-05-18 | Disposition: A | Discharge status: Home / Self Care | Payer: 59 | Source: Ambulatory Visit | Attending: Obstetrics & Gynecology | Admitting: Obstetrics & Gynecology

## 2018-05-18 DIAGNOSIS — Z1231 Encounter for screening mammogram for malignant neoplasm of breast: Secondary | ICD-10-CM

## 2018-05-31 DIAGNOSIS — M5136 Other intervertebral disc degeneration, lumbar region: Secondary | ICD-10-CM | POA: Insufficient documentation

## 2018-07-10 ENCOUNTER — Other Ambulatory Visit: Payer: Self-pay

## 2018-07-10 MED ORDER — ESTRADIOL 0.05 MG/24HR TD PTWK: 0.0500 mg | MEDICATED_PATCH | TRANSDERMAL | 0 refills | Status: DC

## 2018-08-10 ENCOUNTER — Institutional Professional Consult (permissible substitution): Payer: 59 | Admitting: Neurology

## 2018-08-11 ENCOUNTER — Other Ambulatory Visit: Payer: Self-pay | Admitting: Neurology

## 2018-08-12 ENCOUNTER — Other Ambulatory Visit: Payer: Self-pay | Admitting: Obstetrics & Gynecology

## 2018-08-14 NOTE — Telephone Encounter (Signed)
Annual on 10/27/18

## 2018-08-25 ENCOUNTER — Other Ambulatory Visit: Payer: Self-pay | Admitting: *Deleted

## 2018-08-25 MED ORDER — CARBIDOPA-LEVODOPA ER 25-100 MG PO TBCR: 1.0000 | EXTENDED_RELEASE_TABLET | Freq: Every day | ORAL | 1 refills | Status: DC

## 2018-08-31 ENCOUNTER — Institutional Professional Consult (permissible substitution): Payer: Self-pay | Admitting: Neurology

## 2018-10-27 ENCOUNTER — Ambulatory Visit (INDEPENDENT_AMBULATORY_CARE_PROVIDER_SITE_OTHER): Payer: 59 | Admitting: Obstetrics & Gynecology

## 2018-10-27 ENCOUNTER — Encounter: Payer: Self-pay | Admitting: Obstetrics & Gynecology

## 2018-10-27 VITALS — BP 134/86 | Ht 65.0 in | Wt 268.0 lb

## 2018-10-27 DIAGNOSIS — Z6841 Body Mass Index (BMI) 40.0 and over, adult: Secondary | ICD-10-CM

## 2018-10-27 DIAGNOSIS — Z90710 Acquired absence of both cervix and uterus: Secondary | ICD-10-CM | POA: Diagnosis not present

## 2018-10-27 DIAGNOSIS — Z79890 Hormone replacement therapy (postmenopausal): Secondary | ICD-10-CM

## 2018-10-27 DIAGNOSIS — B373 Candidiasis of vulva and vagina: Secondary | ICD-10-CM

## 2018-10-27 DIAGNOSIS — Z01419 Encounter for gynecological examination (general) (routine) without abnormal findings: Secondary | ICD-10-CM

## 2018-10-27 DIAGNOSIS — B3731 Acute candidiasis of vulva and vagina: Secondary | ICD-10-CM

## 2018-10-27 MED ORDER — FLUCONAZOLE 150 MG PO TABS
150.0000 mg | ORAL_TABLET | Freq: Every day | ORAL | 3 refills | Status: AC
Start: 2018-10-27 — End: 2018-10-30

## 2018-10-27 MED ORDER — NYSTATIN 100000 UNIT/GM EX POWD: Freq: Two times a day (BID) | CUTANEOUS | 2 refills | Status: DC

## 2018-10-27 MED ORDER — ESTRADIOL 0.05 MG/24HR TD PTWK: 0.0500 mg | MEDICATED_PATCH | TRANSDERMAL | 4 refills | Status: DC

## 2018-10-27 NOTE — Progress Notes (Signed)
Gloria [name] 1971/08/05 [id number]   History:    47 y.o. G1P1L1  Divorced  RP:  Established patient presenting for annual gyn exam   HPI: Menopause, well on Estradiol patch 0.05 weekly x 1 year.  S/P Total Hysterectomy.  No pelvic pain.  Abstinent.  Frequent yeast vaginitis and yeast of skin in folds.  Breasts wnl.  BMI 44.60.  Not regularly physically active.  Health Labs with Fam MD.  Past medical history,surgical history, family history and social history were all reviewed and documented in the EPIC chart.  Gynecologic History Patient's last menstrual period was 04/25/2013. Contraception: status post hysterectomy Last Pap: 09/2017. Results were: Negative Last mammogram: 04/2018. Results were: Negative Bone Density: Never Colonoscopy: 2014.  Will call to schedule now.  Obstetric History OB History  Gravida Para Term Preterm AB Living  1 1 1     1   SAB TAB Ectopic Multiple Live Births          1    # Outcome Date GA Lbr Len/2nd Weight Sex Delivery Anes PTL Lv  1 Term     M Vag-Spont  N LIV     ROS: A ROS was performed and pertinent positives and negatives are included in the history.  GENERAL: No fevers or chills. HEENT: No change in vision, no earache, sore throat or sinus congestion. NECK: No pain or stiffness. CARDIOVASCULAR: No chest pain or pressure. No palpitations. PULMONARY: No shortness of breath, cough or wheeze. GASTROINTESTINAL: No abdominal pain, nausea, vomiting or diarrhea, melena or bright red blood per rectum. GENITOURINARY: No urinary frequency, urgency, hesitancy or dysuria. MUSCULOSKELETAL: No joint or muscle pain, no back pain, no recent trauma. DERMATOLOGIC: No rash, no itching, no lesions. ENDOCRINE: No polyuria, polydipsia, no heat or cold intolerance. No recent change in weight. HEMATOLOGICAL: No anemia or easy bruising or bleeding. NEUROLOGIC: No headache, seizures, numbness, tingling or weakness. PSYCHIATRIC: No depression, no loss of interest in normal  activity or change in sleep pattern.     Exam:   BP 134/86   Ht 5' 5"  (1.651 m)   Wt 268 lb (121.6 kg)   LMP 04/25/2013   BMI 44.60 kg/m   Body mass index is 44.6 kg/m.  General appearance : Well developed well nourished female. No acute distress HEENT: Eyes: no retinal hemorrhage or exudates,  Neck supple, trachea midline, no carotid bruits, no thyroidmegaly Lungs: Clear to auscultation, no rhonchi or wheezes, or rib retractions  Heart: Regular rate and rhythm, no murmurs or gallops Breast:Examined in sitting and supine position were symmetrical in appearance, no palpable masses or tenderness,  no skin retraction, no nipple inversion, no nipple discharge, no skin discoloration, no axillary or supraclavicular lymphadenopathy Abdomen: no palpable masses or tenderness, no rebound or guarding Extremities: no edema or skin discoloration or tenderness  Pelvic: Vulva: Normal             Vagina: No gross lesions or discharge  Cervix/Uterus absent  Adnexa  Without masses or tenderness  Anus: Normal   Assessment/Plan:  47 y.o. female for annual exam   1. Well woman exam with routine gynecological exam Gynecologic exam status post total hysterectomy and menopause.  Pap test in November 2018 was negative, no indication to repeat this year.  Breast exam normal.  Screening mammogram negative in June 2019.  Health labs with family physician.  Will schedule colonoscopy now.  2. H/O total hysterectomy  3. Postmenopausal hormone replacement therapy Well on estradiol patch 0.05 weekly.  Status post total hysterectomy.  No contraindication to continue on hormone replacement therapy.  Same dosage represcribed.  4. Class 3 severe obesity due to excess calories without serious comorbidity with body mass index (BMI) of 40.0 to 44.9 in adult [facility]) Recommend a lower calorie nutrition, such as [employer].  Aerobic physical activities 5 times a week and weightlifting every 2 days.  5. Yeast  vaginitis Treatment with fluconazole 150 mg 1 tablet daily for 3 days.  Recommend probiotic as needed and boric acid over-the-counter for prevention.  Patient also has a tendency for yeast infection of the skin, nystatin powder represcribed.  Other orders - Vitamin D, Ergocalciferol, (DRISDOL) 1.25 MG (50000 UT) CAPS capsule; Take 50,000 Units by mouth every 7 (seven) days. - nystatin (MYCOSTATIN/NYSTOP) powder; Apply topically 2 (two) times daily. - estradiol (CLIMARA - DOSED IN MG/24 HR) 0.05 mg/24hr patch; Place 1 patch (0.05 mg total) onto the skin once a week. - fluconazole (DIFLUCAN) 150 MG tablet; Take 1 tablet (150 mg total) by mouth daily for 3 days.  Princess Bruins MD, 1:38 PM 10/27/2018

## 2018-10-29 ENCOUNTER — Other Ambulatory Visit: Payer: Self-pay | Admitting: Obstetrics & Gynecology

## 2018-10-29 ENCOUNTER — Encounter: Payer: Self-pay | Admitting: Obstetrics & Gynecology

## 2018-10-29 NOTE — Patient Instructions (Signed)
1. Well woman exam with routine gynecological exam Gynecologic exam status post total hysterectomy and menopause.  Pap test in November 2018 was negative, no indication to repeat this year.  Breast exam normal.  Screening mammogram negative in June 2019.  Health labs with family physician.  Will schedule colonoscopy now.  2. H/O total hysterectomy  3. Postmenopausal hormone replacement therapy Well on estradiol patch 0.05 weekly.  Status post total hysterectomy.  No contraindication to continue on hormone replacement therapy.  Same dosage represcribed.  4. Class 3 severe obesity due to excess calories without serious comorbidity with body mass index (BMI) of 40.0 to 44.9 in adult [facility]) Recommend a lower calorie nutrition, such as [employer].  Aerobic physical activities 5 times a week and weightlifting every 2 days.  5. Yeast vaginitis Treatment with fluconazole 150 mg 1 tablet daily for 3 days.  Recommend probiotic as needed and boric acid over-the-counter for prevention.  Patient also has a tendency for yeast infection of the skin, nystatin powder represcribed.  Other orders - Vitamin D, Ergocalciferol, (DRISDOL) 1.25 MG (50000 UT) CAPS capsule; Take 50,000 Units by mouth every 7 (seven) days. - nystatin (MYCOSTATIN/NYSTOP) powder; Apply topically 2 (two) times daily. - estradiol (CLIMARA - DOSED IN MG/24 HR) 0.05 mg/24hr patch; Place 1 patch (0.05 mg total) onto the skin once a week. - fluconazole (DIFLUCAN) 150 MG tablet; Take 1 tablet (150 mg total) by mouth daily for 3 days.  Gloria [name], it was a pleasure seeing you today!

## 2018-12-21 ENCOUNTER — Encounter: Payer: Self-pay | Admitting: Family Medicine

## 2018-12-21 ENCOUNTER — Ambulatory Visit: Payer: No Typology Code available for payment source | Admitting: Family Medicine

## 2018-12-21 VITALS — BP 128/82 | HR 86 | Temp 98.7°F | Ht 65.0 in | Wt 269.0 lb

## 2018-12-21 DIAGNOSIS — L918 Other hypertrophic disorders of the skin: Secondary | ICD-10-CM

## 2018-12-21 DIAGNOSIS — J302 Other seasonal allergic rhinitis: Secondary | ICD-10-CM | POA: Diagnosis not present

## 2018-12-21 DIAGNOSIS — Z8669 Personal history of other diseases of the nervous system and sense organs: Secondary | ICD-10-CM

## 2018-12-21 DIAGNOSIS — M5136 Other intervertebral disc degeneration, lumbar region: Secondary | ICD-10-CM

## 2018-12-21 DIAGNOSIS — F1011 Alcohol abuse, in remission: Secondary | ICD-10-CM

## 2018-12-21 DIAGNOSIS — F419 Anxiety disorder, unspecified: Secondary | ICD-10-CM

## 2018-12-21 DIAGNOSIS — F3340 Major depressive disorder, recurrent, in remission, unspecified: Secondary | ICD-10-CM

## 2018-12-21 DIAGNOSIS — M797 Fibromyalgia: Secondary | ICD-10-CM

## 2018-12-21 DIAGNOSIS — D229 Melanocytic nevi, unspecified: Secondary | ICD-10-CM

## 2018-12-21 MED ORDER — RIZATRIPTAN BENZOATE 10 MG PO TABS: 10.0000 mg | ORAL_TABLET | ORAL | 5 refills | Status: DC | PRN

## 2018-12-21 MED ORDER — IBUPROFEN 800 MG PO TABS: 800.0000 mg | ORAL_TABLET | Freq: Three times a day (TID) | ORAL | 3 refills | Status: DC | PRN

## 2018-12-21 NOTE — Patient Instructions (Signed)
Cryoablation, Care After This sheet gives you information about how to care for yourself after your procedure. Your health care provider may also give you more specific instructions. If you have problems or questions, contact your health care provider. What can I expect after the procedure? After the procedure, it is common to have:  Soreness around the treatment area.  Mild pain and swelling in the treatment area. Follow these instructions at home: Treatment area care   Follow instructions from your health care provider about how to take care of your incision. Make sure you: ? Wash your hands with soap and water before you change your bandage (dressing). If soap and water are not available, use hand sanitizer. ? Change your dressing as told by your health care provider. ? Leave stitches (sutures) in place. They may need to stay in place for 2 weeks or longer.  Check your treatment area every day for signs of infection. Check for: ? More redness, swelling, or pain. ? More fluid or blood. ? Warmth. ? Pus or a bad smell.  Keep the treated area clean, dry, and covered with a dressing until it has healed. Clean the area with soap and water or as told by your health care provider.  You may shower if your health care provider approves. If your bandage gets wet, change it right away. Activity  Follow instructions from your health care provider about any activity limitations.  Do not drive for 24 hours if you received a medicine to help you relax (sedative). General instructions  Take over-the-counter and prescription medicines only as told by your health care provider.  Keep all follow-up visits as told by your health care provider. This is important. Contact a health care provider if:  You do not have a bowel movement for 2 days.  You have nausea or vomiting.  You have more redness, swelling, or pain around your treatment area.  You have more fluid or blood coming from your  treatment area.  Your treatment area feels warm to the touch.  You have pus or a bad smell coming from your treatment area.  You have a fever. Get help right away if:  You have severe pain.  You have trouble swallowing or breathing.  You have severe weakness or dizziness.  You have chest pain or shortness of breath. This information is not intended to replace advice given to you by your health care provider. Make sure you discuss any questions you have with your health care provider. Document Released: 08/29/2013 Document Revised: 05/28/2016 Document Reviewed: 04/07/2016 Elsevier Interactive Patient Education  2019 [employer], Adult  A skin tag (acrochordon) is a soft, extra growth of skin. Most skin tags are flesh-colored and rarely bigger than a pencil eraser. They commonly form near areas where there are folds in the skin, such as the armpit or groin. Skin tags are not dangerous, and they do not spread from person to person (are not contagious). You may have one skin tag or several. Skin tags do not require treatment. However, your health care provider may recommend removal of a skin tag if it:  Gets irritated from clothing.  Bleeds.  Is visible and unsightly. Your health care provider can remove skin tags with a simple surgical procedure or a procedure that involves freezing the skin tag. Follow these instructions at home:  Watch for any changes in your skin tag. A normal skin tag does not require any other special care at home.  Take over-the-counter and prescription medicines only as told by your health care provider.  Keep all follow-up visits as told by your health care provider. This is important. Contact a health care provider if:  You have a skin tag that: ? Becomes painful. ? Changes color. ? Bleeds. ? Swells.  You develop more skin tags. This information is not intended to replace advice given to you by your health care provider. Make sure you  discuss any questions you have with your health care provider. Document Released: 11/23/2015 Document Revised: 07/04/2016 Document Reviewed: 11/23/2015 Elsevier Interactive Patient Education  2019 [employer].

## 2018-12-21 NOTE — Progress Notes (Addendum)
Patient presents to clinic today to establish care.  SUBJECTIVE: PMH:  Pt is a 48 yo female with pmh sig for seasonal allergies, migraines, fibromyalgia, degenerative disc disease, GERD.  PT previously seen by [facility].  Allergies: -Notes symptoms mainly in fall and spring -We will take Claritin as needed for symptoms  Fibromyalgia: -States symptoms have improved since taking supplements. -Patient notes taking multiple vitamins including B complex, calcium, vitamin D, MVI, thiamine, vitamin C.  History of migraines: -Followed by Dr. Jannifer Franklin at [facility] -May have 1 to 2/month -Taking Maxalt 10 mg as needed  History of alcohol abuse: -Patient states she has been sober x10 years. -H/o alcoholic liver disease  DDD: -Mainly in lumbar spine -Occasionally takes ibuprofen 800 mg -Followed by Dr. Dossie Der -Receives injections  Skin tags: -Patient endorses multiple skin tags that she would like removed  Anxiety and MDD: -Followed by Jill Poling -Taking Ativan 1 mg, rexulti 3 mg  Allergies: Benadryl-hives Requip-migraines Nuvigil-migraines  Past surgical history: Repair of: Kidney at birth Dr. Karsten Ro 1995 Radial tunnel repair bilaterally 2003 Hysterectomy  Social history: Patient is divorced.  Patient currently works as a Market researcher.  Patient denies alcohol, tobacco, drug use.  Patient is a former smoker.  Health Maintenance: Dental --[facility] Vision --Dr. Kaleen Mask, [place] Immunizations --influenza vaccine 2019, tetanus 2013 Colonoscopy --2014 PAP --2018, Dr. Theone Stanley   Past Medical History:  Diagnosis Date  . Allergy   . Anemia   . Anxiety   . Arthritis   . Bulging disc    X 2  . Chronic active hepatitis ([place])   . Chronic back pain 02/25/2015  . Chronic headaches   . Depression   . Diverticulosis   . Elevated LFTs   . Fibromyalgia   . GERD  (gastroesophageal reflux disease)   . History of ETOH abuse   . Hyperlipidemia   . Obese   . Obstructive sleep apnea 02/25/2015  . Osteoarthritis    lower back  . Periodic limb movement disorder 02/25/2014  . Pre-diabetes    pt taking metformin  . RLS (restless legs syndrome)   . Smoker   . Substance abuse [facility])     Past Surgical History:  Procedure Laterality Date  . CERVICAL FUSION    . CYSTOSCOPY  06/06/2013   Procedure: CYSTOSCOPY;  Surgeon: Terrance Mass, MD;  Location: [facility];  Service: Gynecology;;  . INTRAUTERINE DEVICE INSERTION  08/09/2008   PARAGUARD  . kidney surgery  1995   abcess  . LAPAROSCOPY N/A 06/06/2013   Procedure: LAPAROSCOPY DIAGNOSTIC;  Surgeon: Terrance Mass, MD;  Location: [facility];  Service: Gynecology;  Laterality: N/A;  . LAPAROTOMY  06/06/2013   Procedure: EXPLORATORY LAPAROTOMY;  Surgeon: Terrance Mass, MD;  Location: [facility];  Service: Gynecology;;  . LIVER BIOPSY    . radial tunnel Bilateral 2005  . VAGINAL HYSTERECTOMY Left 06/06/2013   Procedure: Vaginal Hysterectomy with Patiial Right Salpingectomy;  Surgeon: Terrance Mass, MD;  Location: [facility];  Service: Gynecology;  Laterality: Left;    Current Outpatient Medications on File Prior to Visit  Medication Sig Dispense Refill  . B Complex Vitamins (B COMPLEX PO) Take 1 tablet by mouth daily.    . Calcium Carbonate-Vitamin D (CALCIUM 600/VITAMIN D) 600-400 MG-UNIT per tablet Take 1 tablet by mouth 2 (two) times daily.     . Carbidopa-Levodopa ER (SINEMET CR) 25-100 MG tablet controlled release Take 1  tablet by mouth at bedtime. 90 tablet 1  . Cholecalciferol (VITAMIN D PO) Take by mouth. Takes 5000units  By mouth daily.    . Coenzyme Q10 (COQ10) 100 MG CAPS Take 100 mg by mouth daily.    [name] estradiol (CLIMARA - DOSED IN MG/24 HR) 0.05 mg/24hr patch APPLY 1 PATCH TOPICALLY TO  SKIN ONCE A WEEK 12 patch 4  . loratadine (CLARITIN) 10 MG tablet Take 10 mg by mouth daily.    [name] LORazepam (ATIVAN)  1 MG tablet Take 1 mg by mouth at bedtime.     . Multiple Vitamins-Minerals (MULTIVITAMIN PO) Take 1 tablet by mouth daily.     [name] nystatin (MYCOSTATIN/NYSTOP) powder Apply topically 2 (two) times daily. 15 g 2  . OMEGA 3-6-9 FATTY ACIDS PO Take 500 mg by mouth daily.    . OXcarbazepine ER (OXTELLAR XR) 600 MG TB24 Take 1.5 tablets at bedtime by mouth.     [name] PROAIR HFA 108 (90 BASE) MCG/ACT inhaler Inhale 1 puff into the lungs every 4 (four) hours as needed for wheezing or shortness of breath.     [name] PROTONIX 40 MG tablet Take 1 capsule by mouth daily.    [name] REXULTI 3 MG TABS Take 1 tablet by mouth daily.  1  . rizatriptan (MAXALT) 10 MG tablet Take 10 mg by mouth as needed for migraine. May repeat in 2 hours if needed    . thiamine (VITAMIN B-1) 50 MG tablet Take 50 mg by mouth daily.    . vitamin C (ASCORBIC ACID) 500 MG tablet Take 500 mg by mouth daily.    . Vitamin D, Ergocalciferol, (DRISDOL) 1.25 MG (50000 UT) CAPS capsule Take 50,000 Units by mouth every 7 (seven) days.     No current facility-administered medications on file prior to visit.     Allergies  Allergen Reactions  . Benadryl [Diphenhydramine Hcl] Hives  . Nuvigil [Armodafinil]     migraines   . Requip [Ropinirole Hcl]     Increased headache    Family History  Problem Relation Age of Onset  . Breast cancer Mother   . Colon polyps Father   . Diabetes Father   . Cirrhosis Father   . Irritable bowel syndrome Sister   . Breast cancer Maternal Aunt   . Breast cancer Paternal Grandmother   . Colon cancer Paternal Grandmother   . Esophageal cancer Paternal Grandfather   . Stomach cancer Paternal Grandfather     Social History   Socioeconomic History  . Marital status: Divorced    Spouse name: Not on file  . Number of children: 1  . Years of education: 47  . Highest education level: Not on file  Occupational History  . Occupation: Child psychotherapist: [employer]  .  Financial resource strain: Not on file  . Food insecurity:    Worry: Not on file    Inability: Not on file  . Transportation needs:    Medical: Not on file    Non-medical: Not on file  Tobacco Use  . Smoking status: Former Smoker    Packs/day: 1.00    Last attempt to quit: 01/26/2012    Years since quitting: 6.9  . Smokeless tobacco: Never Used  Substance and Sexual Activity  . Alcohol use: No    Alcohol/week: 0.0 standard drinks    Comment: History of alcohol abuse  . Drug use: No  . Sexual activity: Not Currently  Lifestyle  .  Physical activity:    Days per week: Not on file    Minutes per session: Not on file  . Stress: Not on file  Relationships  . Social connections:    Talks on phone: Not on file    Gets together: Not on file    Attends religious service: Not on file    Active member of club or organization: Not on file    Attends meetings of clubs or organizations: Not on file    Relationship status: Not on file  . Intimate partner violence:    Fear of current or ex partner: Not on file    Emotionally abused: Not on file    Physically abused: Not on file    Forced sexual activity: Not on file  Other Topics Concern  . Not on file  Social History Narrative   Patient lives at home and works full time.   Education some college.   Right handed.   Caffeine: 2 cups daily.    ROS General: Denies fever, chills, night sweats, changes in weight, changes in appetite HEENT: Denies ear pain, changes in vision, rhinorrhea, sore throat  +migraines CV: Denies CP, palpitations, SOB, orthopnea Pulm: Denies SOB, cough, wheezing GI: Denies abdominal pain, nausea, vomiting, diarrhea, constipation GU: Denies dysuria, hematuria, frequency, vaginal discharge Msk: Denies muscle cramps, joint pains  +DDD in lumbar spine, fibromyalgia Neuro: Denies weakness, numbness, tingling Skin: Denies rashes, bruising  +skin tags. moles Psych: Denies depression, anxiety, hallucinations  BP  128/82 (BP Location: Left Arm, Patient Position: Sitting, Cuff Size: Large)   Pulse 86   Temp 98.7 F (37.1 C) (Oral)   Ht 5' 5"  (1.651 m)   Wt 269 lb (122 kg)   LMP 04/25/2013   SpO2 98%   BMI 44.76 kg/m   Physical Exam Gen. Pleasant, well developed, well-nourished, in NAD HEENT - [place]/AT, PERRL,conjunctive clear, no scleral icterus, no nasal drainage, pharynx without erythema or exudate. Lungs: no use of accessory muscles, CTAB, no wheezes, rales or rhonchi Cardiovascular: RRR, No r/g/m, no peripheral edema Abdomen: BS present, soft, nontender,nondistended Musculoskeletal: No deformities, moves all four extremities, no cyanosis or clubbing, normal tone Neuro:  A&Ox3, CN II-XII intact, normal gait Skin:  Warm, dry, intact.  Numerous nevi and skin tags on neck, flanks, axilla  No results found for this or any previous visit (from the past 2160 hour(s)).  Assessment/Plan: DDD (degenerative disc disease), lumbar -Discussed various ways to treat pain including stretching, exercising (yoga, water aerobics) -Continue following with Dr. Nelva Bush - Plan: ibuprofen (ADVIL,MOTRIN) 800 MG tablet  Fibromyalgia -Continue supplements -Discussed exercise  History of migraine -Discussed ways to reduce/prevent headaches. -Continue to follow with Dr. Jannifer Franklin at [facility]: rizatriptan (MAXALT) 10 MG tablet  Seasonal allergies -Continue Claritin as needed -Consider local honey  Skin tags, multiple acquired -Consent obtained.  Skin tags removed with scalpel.  liquid nitrogen used on another.  Patient tolerated procedure well.  <14 skin tags removed. -Given handout  Atypical nevi  - Plan: Ambulatory referral to Dermatology  Alcohol abuse in remission -Congratulated on 10 years being sober  Major Depressive disorder in remission -Continue following with Jill Poling -Continue Ativan 1 mg and rexulti as rx'd by her [facility] providers  Anxiety -stable -continue Ativan 1 mg prn -continue  counseling.  Follow-up PRN  Grier Mitts, MD

## 2018-12-25 ENCOUNTER — Encounter: Payer: Self-pay | Admitting: Family Medicine

## 2019-02-20 ENCOUNTER — Telehealth: Payer: Self-pay

## 2019-02-20 NOTE — Telephone Encounter (Signed)
Copied from [place] [id number]. Topic: Appointment Scheduling - Scheduling Inquiry for Clinic >> Feb 20, 2019  9:09 AM Reyne Dumas L wrote: Reason for CRM:  Pt called and left message on [address] 02/19/2019.  States she needs to get an office visit for an ear ache that is going on for 2 weeks. Pt can be reached at [phone]

## 2019-02-20 NOTE — Telephone Encounter (Signed)
Spoke with pt states that she had Tele Med and she was advised to take Sudafed for the sinus pressure, pt was advised to call for a web visit if she does not get better

## 2019-03-03 ENCOUNTER — Other Ambulatory Visit: Payer: Self-pay | Admitting: Neurology

## 2019-04-04 ENCOUNTER — Other Ambulatory Visit: Payer: Self-pay | Admitting: Obstetrics & Gynecology

## 2019-04-04 ENCOUNTER — Other Ambulatory Visit: Payer: Self-pay | Admitting: Family Medicine

## 2019-04-04 DIAGNOSIS — Z1231 Encounter for screening mammogram for malignant neoplasm of breast: Secondary | ICD-10-CM

## 2019-04-06 ENCOUNTER — Other Ambulatory Visit: Payer: Self-pay

## 2019-04-06 ENCOUNTER — Ambulatory Visit: Payer: Self-pay

## 2019-04-06 ENCOUNTER — Ambulatory Visit (INDEPENDENT_AMBULATORY_CARE_PROVIDER_SITE_OTHER): Payer: No Typology Code available for payment source | Admitting: Family Medicine

## 2019-04-06 ENCOUNTER — Encounter: Payer: Self-pay | Admitting: Family Medicine

## 2019-04-06 DIAGNOSIS — E1165 Type 2 diabetes mellitus with hyperglycemia: Secondary | ICD-10-CM | POA: Diagnosis not present

## 2019-04-06 DIAGNOSIS — J029 Acute pharyngitis, unspecified: Secondary | ICD-10-CM | POA: Diagnosis not present

## 2019-04-06 DIAGNOSIS — R631 Polydipsia: Secondary | ICD-10-CM | POA: Diagnosis not present

## 2019-04-06 DIAGNOSIS — IMO0001 Reserved for inherently not codable concepts without codable children: Secondary | ICD-10-CM | POA: Insufficient documentation

## 2019-04-06 DIAGNOSIS — J020 Streptococcal pharyngitis: Secondary | ICD-10-CM | POA: Diagnosis not present

## 2019-04-06 DIAGNOSIS — E119 Type 2 diabetes mellitus without complications: Secondary | ICD-10-CM | POA: Insufficient documentation

## 2019-04-06 LAB — POCT GLYCOSYLATED HEMOGLOBIN (HGB A1C): Hemoglobin A1C: 12 % — AB (ref 4.0–5.6)

## 2019-04-06 LAB — POCT RAPID STREP A (OFFICE): Rapid Strep A Screen: POSITIVE — AB

## 2019-04-06 MED ORDER — AMOXICILLIN 500 MG PO CAPS: 500.0000 mg | ORAL_CAPSULE | Freq: Two times a day (BID) | ORAL | 0 refills | Status: AC

## 2019-04-06 MED ORDER — PREDNISONE 20 MG PO TABS: 40.0000 mg | ORAL_TABLET | Freq: Every day | ORAL | 0 refills | Status: DC

## 2019-04-06 NOTE — Addendum Note (Signed)
Addended by: Wyvonne Lenz on: 04/06/2019 11:16 AM   Modules accepted: Orders

## 2019-04-06 NOTE — Telephone Encounter (Signed)
Pt was seen virtually this morning

## 2019-04-06 NOTE — Progress Notes (Addendum)
Virtual Visit via Telephone Note Unable to connect via doxy d/t connectivity issues. Provider was able to see pt, but video went out when pt was called.  I connected with Gloria [name] on 04/06/19 at 10:30 AM EDT by telephone and verified that I am speaking with the correct person using two identifiers.   I discussed the limitations, risks, security and privacy concerns of performing an evaluation and management service by telephone and the availability of in person appointments. I also discussed with the patient that there may be a patient responsible charge related to this service. The patient expressed understanding and agreed to proceed.  Location patient: home Location provider: work or home office Participants present for the call: patient, provider Patient did not have a visit in the prior 7 days to address this/these issue(s).   History of Present Illness: Pt with sore throat x 9 days.  Started Tues or Wed of last wk.  Throat hurts more in the am, feels dry and sore.  Tried ibuprofen.  Feels like foods (liquids and solids) get stuck if eats too fast.  Endorses nasal congestion, increased thirst, occasional HA.  Thinks related to allergies, on claritin for yrs.  Denies ear pain or pressure, facial pain or pressure, coughing, icreased urination or hunger.  Pt denies sick contacts. Has been working from home.   Observations/Objective: Patient sounds cheerful and well on the phone. I do not appreciate any SOB. Speech and thought processing are grossly intact. Patient reported vitals:  Assessment and Plan: Sore throat  -discussed possible causes viral, bacterial, allergy related -discussed gargling with warm salt water or chloraseptic spray. -pt will come by clinic for rapid strep -given duration of symptoms discussed treating for strep  -discussed short course of prednisone given dysphagia -consider switching allergy meds - Plan: predniSONE (DELTASONE) 20 MG tablet,  amoxicillin (AMOXIL) 500 MG capsule  Increased thirst -POC hgbA1C  ~~Update:  Pt called to review results. Rapid strep test positive. Pt advised to proceed with picking up rx for amoxicillin.   Pt advised hgb A1C was 12 which places her in the diabetic range.  Pt expressed shock, last hgb A1c was 6.1% on 10/27/12.  Briefly discussed diabetes.  Discussed food choices- pt has cookies after dinner and starches typically at dinner.  F/u appt made for 5/20 at 4:30 pm to further discuss DM and medication options.  Pt will check with insurance company regarding preferred glucometer.    Strep pharyngitis:   -Amoxicillin    Uncontrolled Diabetes mellitus without complication, without long-term use of insluin (Diablock):   -new dx   -hgb A1C 12.0%   -will f/u next wk to discuss in person.   Follow Up Instructions: F/u prn for sore throat.   DM f/u scheduled for 5/20 at 4:30 pm.  I did not refer this patient for an OV in the next 24 hours for this/these issue(s).  I discussed the assessment and treatment plan with the patient. The patient was provided an opportunity to ask questions and all were answered. The patient agreed with the plan and demonstrated an understanding of the instructions.   The patient was advised to call back or seek an in-person evaluation if the symptoms worsen or if the condition fails to improve as anticipated.  I provided 11 minutes of non-face-to-face time during this encounter.   Billie Ruddy, MD

## 2019-04-06 NOTE — Telephone Encounter (Signed)
Patient called and says she's been having a sore throat for over a week, since all last week and this week. She says she can swallow, but sometimes she has difficulty with some solids. She denies any cold symptoms, denies fever, SOB. She says she is fatigued and thirsty a lot. I called the office and spoke to [facility], [facility] who asked to speak to the patient, the call was connected successfully.   Answer Assessment - Initial Assessment Questions 1. ONSET: "When did the throat start hurting?" (Hours or days ago)      Since all last week 2. SEVERITY: "How bad is the sore throat?" (Scale 1-10; mild, moderate or severe)   - MILD (1-3):  doesn't interfere with eating or normal activities   - MODERATE (4-7): interferes with eating some solids and normal activities   - SEVERE (8-10):  excruciating pain, interferes with most normal activities   - SEVERE DYSPHAGIA: can't swallow liquids, drooling     Mild-moderate 3. STREP EXPOSURE: "Has there been any exposure to strep within the past week?" If so, ask: "What type of contact occurred?"      No 4.  VIRAL SYMPTOMS: "Are there any symptoms of a cold, such as a runny nose, cough, hoarse voice or red eyes?"       No 5. FEVER: "Do you have a fever?" If so, ask: "What is your temperature, how was it measured, and when did it start?"     No 6. PUS ON THE TONSILS: "Is there pus on the tonsils in the back of your throat?"     Can't see 7. OTHER SYMPTOMS: "Do you have any other symptoms?" (e.g., difficulty breathing, headache, rash)     Fatigue, thirsty 8. PREGNANCY: "Is there any chance you are pregnant?" "When was your last menstrual period?"     No  Protocols used: SORE THROAT-A-AH

## 2019-04-09 ENCOUNTER — Telehealth: Payer: Self-pay | Admitting: Family Medicine

## 2019-04-09 NOTE — Telephone Encounter (Signed)
Copied from [place] [id number]. Topic: Quick Communication - Rx Refill/Question >> Apr 09, 2019 10:47 AM Rayann Heman wrote: Medication: pt called and stated that she called her insurance to see what kind of glucose meter was cover and was told that the office would have to call. Insurance # [id number]

## 2019-04-11 ENCOUNTER — Other Ambulatory Visit: Payer: Self-pay

## 2019-04-11 ENCOUNTER — Encounter: Payer: Self-pay | Admitting: Family Medicine

## 2019-04-11 ENCOUNTER — Ambulatory Visit (INDEPENDENT_AMBULATORY_CARE_PROVIDER_SITE_OTHER): Payer: No Typology Code available for payment source | Admitting: Family Medicine

## 2019-04-11 VITALS — BP 126/82 | HR 98 | Temp 98.4°F | Wt 251.0 lb

## 2019-04-11 DIAGNOSIS — E119 Type 2 diabetes mellitus without complications: Secondary | ICD-10-CM

## 2019-04-11 LAB — POCT GLUCOSE (DEVICE FOR HOME USE): POC Glucose: 303 mg/dl — AB (ref 70–99)

## 2019-04-11 LAB — POCT GLYCOSYLATED HEMOGLOBIN (HGB A1C): Hemoglobin A1C: 11.9 % — AB (ref 4.0–5.6)

## 2019-04-11 MED ORDER — METFORMIN HCL 500 MG PO TABS: 500.0000 mg | ORAL_TABLET | Freq: Two times a day (BID) | ORAL | 3 refills | Status: DC

## 2019-04-11 MED ORDER — BLOOD GLUCOSE MONITOR KIT: PACK | 0 refills | Status: AC

## 2019-04-11 NOTE — Telephone Encounter (Signed)
Pt was seen this evening by dr Volanda Napoleon and Rx for a meter was provided

## 2019-04-11 NOTE — Patient Instructions (Addendum)
Today we discussed starting Metformin 500 mg for diabetes.  You can start off taking 500 mg (1 tab) each morning for a week.  If you tolerate the medication, we will increase the dose to 1 tab twice a day.    Type 2 Diabetes Mellitus, Diagnosis, Adult Type 2 diabetes (type 2 diabetes mellitus) is a long-term (chronic) disease. In type 2 diabetes, one or both of these problems may be present:  The pancreas does not make enough of a hormone called insulin.  Cells in the body do not respond properly to insulin that the body makes (insulin resistance). Normally, insulin allows blood sugar (glucose) to enter cells in the body. The cells use glucose for energy. Insulin resistance or lack of insulin causes excess glucose to build up in the blood instead of going into cells. As a result, high blood glucose (hyperglycemia) develops. What increases the risk? The following factors may make you more likely to develop type 2 diabetes:  Having a family member with type 2 diabetes.  Being overweight or obese.  Having an inactive (sedentary) lifestyle.  Having been diagnosed with insulin resistance.  Having a history of prediabetes, gestational diabetes, or polycystic ovary syndrome (PCOS).  Being of American-Indian, African-American, Hispanic/Latino, or Asian/Pacific Islander descent. What are the signs or symptoms? In the early stage of this condition, you may not have symptoms. Symptoms develop slowly and may include:  Increased thirst (polydipsia).  Increased hunger(polyphagia).  Increased urination (polyuria).  Increased urination during the night (nocturia).  Unexplained weight loss.  Frequent infections that keep coming back (recurring).  Fatigue.  Weakness.  Vision changes, such as blurry vision.  Cuts or bruises that are slow to heal.  Tingling or numbness in the hands or feet.  Dark patches on the skin (acanthosis nigricans). How is this diagnosed? This condition is  diagnosed based on your symptoms, your medical history, a physical exam, and your blood glucose level. Your blood glucose may be checked with one or more of the following blood tests:  A fasting blood glucose (FBG) test. You will not be allowed to eat (you will fast) for 8 hours or longer before a blood sample is taken.  A random blood glucose test. This test checks blood glucose at any time of day regardless of when you ate.  An A1c (hemoglobin A1c) blood test. This test provides information about blood glucose control over the previous 2-3 months.  An oral glucose tolerance test (OGTT). This test measures your blood glucose at two times: ? After fasting. This is your baseline blood glucose level. ? Two hours after drinking a beverage that contains glucose. You may be diagnosed with type 2 diabetes if:  Your FBG level is 126 mg/dL (7.0 mmol/L) or higher.  Your random blood glucose level is 200 mg/dL (11.1 mmol/L) or higher.  Your A1c level is 6.5% or higher.  Your OGTT result is higher than 200 mg/dL (11.1 mmol/L). These blood tests may be repeated to confirm your diagnosis. How is this treated? Your treatment may be managed by a specialist called an endocrinologist. Type 2 diabetes may be treated by following instructions from your health care provider about:  Making diet and lifestyle changes. This may include: ? Following an individualized nutrition plan that is developed by a diet and nutrition specialist (registered dietitian). ? Exercising regularly. ? Finding ways to manage stress.  Checking your blood glucose level as often as told.  Taking diabetes medicines or insulin daily. This helps to keep  your blood glucose levels in the healthy range. ? If you use insulin, you may need to adjust the dosage depending on how physically active you are and what foods you eat. Your health care provider will tell you how to adjust your dosage.  Taking medicines to help prevent  complications from diabetes, such as: ? Aspirin. ? Medicine to lower cholesterol. ? Medicine to control blood pressure. Your health care provider will set individualized treatment goals for you. Your goals will be based on your age, other medical conditions you have, and how you respond to diabetes treatment. Generally, the goal of treatment is to maintain the following blood glucose levels:  Before meals (preprandial): 80-130 mg/dL (4.4-7.2 mmol/L).  After meals (postprandial): below 180 mg/dL (10 mmol/L).  A1c level: less than 7%. Follow these instructions at home: Questions to ask your health care provider  Consider asking the following questions: ? Do I need to meet with a diabetes educator? ? Where can I find a support group for people with diabetes? ? What equipment will I need to manage my diabetes at home? ? What diabetes medicines do I need, and when should I take them? ? How often do I need to check my blood glucose? ? What number can I call if I have questions? ? When is my next appointment? General instructions  Take over-the-counter and prescription medicines only as told by your health care provider.  Keep all follow-up visits as told by your health care provider. This is important.  For more information about diabetes, visit: ? American Diabetes Association (ADA): www.diabetes.org ? American Association of Diabetes Educators (AADE): www.diabeteseducator.org Contact a health care provider if:  Your blood glucose is at or above 240 mg/dL (13.3 mmol/L) for 2 days in a row.  You have been sick or have had a fever for 2 days or longer, and you are not getting better.  You have any of the following problems for more than 6 hours: ? You cannot eat or drink. ? You have nausea and vomiting. ? You have diarrhea. Get help right away if:  Your blood glucose is lower than 54 mg/dL (3.0 mmol/L).  You become confused or you have trouble thinking clearly.  You have  difficulty breathing.  You have moderate or large ketone levels in your urine. Summary  Type 2 diabetes (type 2 diabetes mellitus) is a long-term (chronic) disease. In type 2 diabetes, the pancreas does not make enough of a hormone called insulin, or cells in the body do not respond properly to insulin that the body makes (insulin resistance).  This condition is treated by making diet and lifestyle changes and taking diabetes medicines or insulin.  Your health care provider will set individualized treatment goals for you. Your goals will be based on your age, other medical conditions you have, and how you respond to diabetes treatment.  Keep all follow-up visits as told by your health care provider. This is important. This information is not intended to replace advice given to you by your health care provider. Make sure you discuss any questions you have with your health care provider. Document Released: 11/08/2005 Document Revised: 06/09/2017 Document Reviewed: 12/12/2015 Elsevier Interactive Patient Education  2019 [employer]  Preventing Diabetes Mellitus Complications You can take action to prevent or slow down problems that are caused by diabetes (diabetes mellitus). Following your diabetes plan and taking care of yourself can reduce your risk of serious or life-threatening complications. What actions can I take to prevent  diabetes complications? Manage your diabetes   Follow instructions from your health care providers about managing your diabetes. Your diabetes may be managed by a team of health care providers who can teach you how to care for yourself and can answer questions that you have.  Educate yourself about your condition so you can make healthy choices about eating and physical activity.  Check your blood sugar (glucose) levels as often as directed. Your health care provider will help you decide how often to check your blood glucose level depending on your treatment goals  and how well you are meeting them.  Ask your health care provider if you should take low-dose aspirin daily and what dose is recommended for you. Taking low-dose aspirin daily is recommended to help prevent cardiovascular disease. Do not use nicotine or tobacco Do not use any products that contain nicotine or tobacco, such as cigarettes and e-cigarettes. If you need help quitting, ask your health care provider. Nicotine raises your risk for diabetes problems. If you quit using nicotine:  You will lower your risk for heart attack, stroke, nerve disease, and kidney disease.  Your cholesterol and blood pressure may improve.  Your blood circulation will improve. Keep your blood pressure under control Your personal target blood pressure is determined based on:  Your age.  Your medicines.  How long you have had diabetes.  Any other medical conditions you have. To control your blood pressure:  Follow instructions from your health care provider about meal planning, exercise, and medicines.  Make sure your health care provider checks your blood pressure at every medical visit.  Monitor your blood pressure at home as told by your health care provider.  Keep your cholesterol under control To control your cholesterol:  Follow instructions from your health care provider about meal planning, exercise, and medicines.  Have your cholesterol checked at least once a year.  You may be prescribed medicine to lower cholesterol (statin). If you are not taking a statin, ask your health care provider if you should be. Controlling your cholesterol may:  Help prevent heart disease and stroke. These are the most common health problems for people with diabetes.  Improve your blood flow. Schedule and keep yearly physical exams and eye exams Your health care provider will tell you how often you need medical visits depending on your diabetes management plan. Keep all follow-up visits as directed. This is  important so possible problems can be identified early and complications can be avoided or treated.  Every visit with your health care provider should include measuring your: ? Weight. ? Blood pressure. ? Blood glucose control.  Your A1c (hemoglobin A1c) level should be checked: ? At least 2 times a year, if you are meeting your treatment goals. ? 4 times a year, if you are not meeting treatment goals or if your treatment goals have changed.  Your blood lipids (lipid profile) should be checked yearly. You should also be checked yearly for protein in your urine (urine microalbumin).  If you have type 1 diabetes, get an eye exam 3-5 years after you are diagnosed, and then once a year after your first exam.  If you have type 2 diabetes, get an eye exam as soon as you are diagnosed, and then once a year after your first exam. Keep your vaccines current It is recommended that you receive:  A flu (influenza) vaccine every year.  A pneumonia (pneumococcal) vaccine and a hepatitis B vaccine. If you are age 18 or older,  you may get the pneumonia vaccine as a series of two separate shots. Ask your health care provider which other vaccines may be recommended. Take care of your feet Diabetes may cause you to have poor blood circulation to your legs and feet. Because of this, taking care of your feet is very important. Diabetes can cause:  The skin on the feet to get thinner, break more easily, and heal more slowly.  Nerve damage in your legs and feet, which results in decreased feeling. You may not notice minor injuries that could lead to serious problems. To avoid foot problems:  Check your skin and feet every day for cuts, bruises, redness, blisters, or sores.  Schedule a foot exam with your health care provider once every year. This exam includes: ? Inspecting of the structure and skin of your feet. ? Checking the pulses and sensation in your feet.  Make sure that your health care  provider performs a visual foot exam at every medical visit.  Take care of your teeth People with poorly controlled diabetes are more likely to have gum (periodontal) disease. Diabetes can make periodontal diseases harder to control. If not treated, periodontal diseases can lead to tooth loss. To prevent this:  Brush your teeth twice a day.  Floss at least once a day.  Visit your dentist 2 times a year. Drink responsibly Limit alcohol intake to no more than 1 drink a day for nonpregnant women and 2 drinks a day for men. One drink equals 12 oz of beer, 5 oz of wine, or 1 oz of hard liquor.  It is important to eat food when you drink alcohol to avoid low blood glucose (hypoglycemia). Avoid alcohol if you:  Have a history of alcohol abuse or dependence.  Are pregnant.  Have liver disease, pancreatitis, advanced neuropathy, or severe hypertriglyceridemia. Lessen stress Living with diabetes can be stressful. When you are experiencing stress, your blood glucose may be affected in two ways:  Stress hormones may cause your blood glucose to rise.  You may be distracted from taking good care of yourself. Be aware of your stress level and make changes to help you manage challenging situations. To lower your stress levels:  Consider joining a support group.  Do planned relaxation or meditation.  Do a hobby that you enjoy.  Maintain healthy relationships.  Exercise regularly.  Work with your health care provider or a mental health professional. Summary  You can take action to prevent or slow down problems that are caused by diabetes (diabetes mellitus). Following your diabetes plan and taking care of yourself can reduce your risk of serious or life-threatening complications.  Follow instructions from your health care providers about managing your diabetes. Your diabetes may be managed by a team of health care providers who can teach you how to care for yourself and can answer questions  that you have.  Your health care provider will tell you how often you need medical visits depending on your diabetes management plan. Keep all follow-up visits as directed. This is important so possible problems can be identified early and complications can be avoided or treated. This information is not intended to replace advice given to you by your health care provider. Make sure you discuss any questions you have with your health care provider. Document Released: 07/27/2011 Document Revised: 06/28/2017 Document Reviewed: 08/07/2016 Elsevier Interactive Patient Education  2019 [employer].  Blood Glucose Monitoring, Adult Monitoring your blood sugar (glucose) is an important part of managing your  diabetes (diabetes mellitus). Blood glucose monitoring involves checking your blood glucose as often as directed and keeping a record (log) of your results over time. Checking your blood glucose regularly and keeping a blood glucose log can:  Help you and your health care provider adjust your diabetes management plan as needed, including your medicines or insulin.  Help you understand how food, exercise, illnesses, and medicines affect your blood glucose.  Let you know what your blood glucose is at any time. You can quickly find out if you have low blood glucose (hypoglycemia) or high blood glucose (hyperglycemia). Your health care provider will set individualized treatment goals for you. Your goals will be based on your age, other medical conditions you have, and how you respond to diabetes treatment. Generally, the goal of treatment is to maintain the following blood glucose levels:  Before meals (preprandial): 80-130 mg/dL (4.4-7.2 mmol/L).  After meals (postprandial): below 180 mg/dL (10 mmol/L).  A1c level: less than 7%. Supplies needed:  Blood glucose meter.  Test strips for your meter. Each meter has its own strips. You must use the strips that came with your meter.  A needle to prick  your finger (lancet). Do not use a lancet more than one time.  A device that holds the lancet (lancing device).  A journal or log book to write down your results. How to check your blood glucose  1. Wash your hands with soap and water. 2. Prick the side of your finger (not the tip) with the lancet. Use a different finger each time. 3. Gently rub the finger until a small drop of blood appears. 4. Follow instructions that come with your meter for inserting the test strip, applying blood to the strip, and using your blood glucose meter. 5. Write down your result and any notes. Some meters allow you to use areas of your body other than your finger (alternative sites) to test your blood. The most common alternative sites are:  Forearm.  Thigh.  Palm of the hand. If you think you may have hypoglycemia, or if you have a history of not knowing when your blood glucose is getting low (hypoglycemia unawareness), do not use alternative sites. Use your finger instead. Alternative sites may not be as accurate as the fingers, because blood flow is slower in these areas. This means that the result you get may be delayed, and it may be different from the result that you would get from your finger. Follow these instructions at home: Blood glucose log   Every time you check your blood glucose, write down your result. Also write down any notes about things that may be affecting your blood glucose, such as your diet and exercise for the day. This information can help you and your health care provider: ? Look for patterns in your blood glucose over time. ? Adjust your diabetes management plan as needed.  Check if your meter allows you to download your records to a computer. Most glucose meters store a record of glucose readings in the meter. If you have type 1 diabetes:  Check your blood glucose 2 or more times a day.  Also check your blood glucose: ? Before every insulin injection. ? Before and after  exercise. ? Before meals. ? 2 hours after a meal. ? Occasionally between 2:00 a.m. and 3:00 a.m., as directed. ? Before potentially dangerous tasks, like driving or using heavy machinery. ? At bedtime.  You may need to check your blood glucose more often, up  to 6-10 times a day, if you: ? Use an insulin pump. ? Need multiple daily injections (MDI). ? Have diabetes that is not well-controlled. ? Are ill. ? Have a history of severe hypoglycemia. ? Have hypoglycemia unawareness. If you have type 2 diabetes:  If you take insulin or other diabetes medicines, check your blood glucose 2 or more times a day.  If you are on intensive insulin therapy, check your blood glucose 4 or more times a day. Occasionally, you may also need to check between 2:00 a.m. and 3:00 a.m., as directed.  Also check your blood glucose: ? Before and after exercise. ? Before potentially dangerous tasks, like driving or using heavy machinery.  You may need to check your blood glucose more often if: ? Your medicine is being adjusted. ? Your diabetes is not well-controlled. ? You are ill. General tips  Always keep your supplies with you.  If you have questions or need help, all blood glucose meters have a 24-hour "hotline" phone number that you can call. You may also contact your health care provider.  After you use a few boxes of test strips, adjust (calibrate) your blood glucose meter by following instructions that came with your meter. Contact a health care provider if:  Your blood glucose is at or above 240 mg/dL (13.3 mmol/L) for 2 days in a row.  You have been sick or have had a fever for 2 days or longer, and you are not getting better.  You have any of the following problems for more than 6 hours: ? You cannot eat or drink. ? You have nausea or vomiting. ? You have diarrhea. Get help right away if:  Your blood glucose is lower than 54 mg/dL (3 mmol/L).  You become confused or you have trouble  thinking clearly.  You have difficulty breathing.  You have moderate or large ketone levels in your urine. Summary  Monitoring your blood sugar (glucose) is an important part of managing your diabetes (diabetes mellitus).  Blood glucose monitoring involves checking your blood glucose as often as directed and keeping a record (log) of your results over time.  Your health care provider will set individualized treatment goals for you. Your goals will be based on your age, other medical conditions you have, and how you respond to diabetes treatment.  Every time you check your blood glucose, write down your result. Also write down any notes about things that may be affecting your blood glucose, such as your diet and exercise for the day. This information is not intended to replace advice given to you by your health care provider. Make sure you discuss any questions you have with your health care provider. Document Released: 11/11/2003 Document Revised: 09/19/2017 Document Reviewed: 04/19/2016 Elsevier Interactive Patient Education  2019 [employer].

## 2019-04-12 ENCOUNTER — Encounter: Payer: Self-pay | Admitting: Family Medicine

## 2019-04-12 ENCOUNTER — Telehealth: Payer: Self-pay | Admitting: Family Medicine

## 2019-04-12 ENCOUNTER — Telehealth: Payer: Self-pay

## 2019-04-12 MED ORDER — ONETOUCH VERIO FLEX SYSTEM W/DEVICE KIT: PACK | 0 refills | Status: AC

## 2019-04-12 NOTE — Addendum Note (Signed)
Addended by: Wyvonne Lenz on: 04/12/2019 02:52 PM   Modules accepted: Orders

## 2019-04-12 NOTE — Telephone Encounter (Signed)
Copied from [place] [phone]. Topic: General - Inquiry >> Apr 12, 2019  2:26 PM [place], [place] D wrote: Reason for CRM: Pt stated that she picked up her glucometer and was only able to use it once before it stopped working. The pharmacy would not replace it. She is able to get a free one with a coupon from the manufacturer if she has a rx from Dr. Volanda Napoleon. She is requesting a rx for a ONE TOUCH VERIO FLEX meter to be sent to [place] otherwise she will not have a meter. Please advise.  [place], [place] - [address] [phone] (Phone) [phone] (Fax)

## 2019-04-12 NOTE — Telephone Encounter (Signed)
I contacted the pt in regards to her 4 month f.u scheduled for 04/19/19.  I left a vm asking her to call me back on her mobile #. Needing to verify it pt would be interested to completing a virtual video visit or coming into the clinic.  If pt is not agreeable to either, we could offer a telephone visit.

## 2019-04-12 NOTE — Telephone Encounter (Signed)
Pt picked up a new Rx for a meter states that the one she picked up broke and she also got a coupon for a different brand which she does not have to pay for

## 2019-04-12 NOTE — Progress Notes (Signed)
Subjective:    Patient ID: Gloria [name], female    DOB: 1971-03-18, 48 y.o.   MRN: [id number]  No chief complaint on file.   HPI Pt is a 48 yo female with pmh sig for fibromyalgia, h/o alcohol abuse (sober x 11 yrs), h/o EtOH liver dz., OSA, GERD, h/o migraines, anxiety, back pain who was seen today for f/u.  Pt seen 04/06/19 for acute illness and increased thirst.  She was dx'd with DM after Hgb A1C 12%.  Pt states she has been doing research online to learn more about the disease.  She has started cutting out "white foods" and reducing sugar intake.  Pt's father has a h/o DM.  Pt notes she had a recent eye exam, but not a diabetic eye exam.    Pt arrived 30 min prior to her appointment, however as the clinic closed early (4PM) 2/2 COVID-19 pandemic she was not allowed in the building.  Pt attempted to call the [facility] for assistance with no success.  Pt contacted by this provider and let in the building for her 4:30pm appt.  Past Medical History:  Diagnosis Date  . Allergy   . Anemia   . Anxiety   . Arthritis   . Bulging disc    X 2  . Chronic active hepatitis ([place])   . Chronic back pain 02/25/2015  . Chronic headaches   . Depression   . Diverticulosis   . Elevated LFTs   . Fibromyalgia   . GERD (gastroesophageal reflux disease)   . History of ETOH abuse   . Hyperlipidemia   . Obese   . Obstructive sleep apnea 02/25/2015  . Osteoarthritis    lower back  . Periodic limb movement disorder 02/25/2014  . Pre-diabetes    pt taking metformin  . RLS (restless legs syndrome)   . Smoker   . Substance abuse (HCC)     Allergies  Allergen Reactions  . Benadryl [Diphenhydramine Hcl] Hives  . Nuvigil [Armodafinil]     migraines   . Requip [Ropinirole Hcl]     Increased headache    ROS General: Denies fever, chills, night sweats, changes in weight, changes in appetite HEENT: Denies headaches, ear pain, changes in vision, rhinorrhea, sore throat CV: Denies CP, palpitations, SOB,  orthopnea Pulm: Denies SOB, cough, wheezing GI: Denies abdominal pain, nausea, vomiting, diarrhea, constipation  +increased thirst GU: Denies dysuria, hematuria, frequency, vaginal discharge Msk: Denies muscle cramps, joint pains Neuro: Denies weakness, numbness, tingling Skin: Denies rashes, bruising Psych: Denies depression, anxiety, hallucinations     Objective:    Blood pressure 126/82, pulse 98, temperature 98.4 F (36.9 C), temperature source Oral, weight 251 lb (113.9 kg), last menstrual period 04/25/2013, SpO2 99 %.  Gen. Pleasant, well-nourished, in no distress, normal affect   HEENT: [place]/AT, face symmetric, conjunctiva clear, no scleral icterus, PERRLA, EOMI, nares patent without drainage Lungs: no accessory muscle use, CTAB, no wheezes or rales Cardiovascular: RRR, no m/r/g, no peripheral edema Neuro:  A&Ox3, CN II-XII intact, normal gait Skin:  Warm, no lesions/ rash Diabetic Foot Exam - Simple   Simple Foot Form Diabetic Foot exam was performed with the following findings:  Yes 04/11/2019  5:11 PM  Visual Inspection No deformities, no ulcerations, no other skin breakdown bilaterally:  Yes See comments:  Yes Sensation Testing Intact to touch and monofilament testing bilaterally:  Yes See comments:  Yes Pulse Check Posterior Tibialis and Dorsalis pulse intact bilaterally:  Yes Comments Mild callus of b/l  feet on heels.  B/l bunions. Mildly decreased sensation to light touch and vibration of L lateral foot/5th digit.      Wt Readings from Last 3 Encounters:  04/11/19 251 lb (113.9 kg)  12/21/18 269 lb (122 kg)  10/27/18 268 lb (121.6 kg)    Lab Results  Component Value Date   WBC 9.0 10/06/2017   HGB 12.7 10/06/2017   HCT 38.9 10/06/2017   PLT 303 10/06/2017   GLUCOSE 80 10/06/2017   CHOL 188 09/03/2016   TRIG 325 (H) 09/03/2016   HDL 29 (L) 09/03/2016   LDLCALC 94 09/03/2016   ALT 18 10/06/2017   AST 16 10/06/2017   NA 140 10/06/2017   K 4.2  10/06/2017   CL 100 10/06/2017   CREATININE 0.55 (L) 10/06/2017   BUN 6 10/06/2017   CO2 27 10/06/2017   TSH 2.400 10/06/2017   INR 0.99 12/30/2016   HGBA1C 11.9 (A) 04/11/2019    Assessment/Plan:  Newly diagnosed diabetes ([place])  -pt given numerous handouts.   -Discussed disease process -questions answered to statisfaction -discussed lifestyle modifications -will start Metformin 500 mg daily x 1 wk, then increase to Metformin 500 mg BID if tolerated.  Discussed will likely need additional medications to bring blood sugar down. -pt requested Hgb A1C recheck, 11.9% this visit.  Was 12% on 04/06/19 -fsbs 303 in clinic.  Pt instructed how to use a glucometer. -advised to make diabetic retinopathy screen appt.  Pt will contact her eye care provider as she does not have vision insurance. -foot exam done this visit - Plan: Ambulatory referral to diabetic education, blood glucose meter kit and supplies KIT, metFORMIN (GLUCOPHAGE) 500 MG tablet, POCT Glucose (Device for Home Use), POCT glycosylated hemoglobin (Hb A1C)  F/u in 1 month  Grier Mitts, MD

## 2019-04-17 ENCOUNTER — Telehealth: Payer: Self-pay | Admitting: Neurology

## 2019-04-17 NOTE — Telephone Encounter (Signed)
Due to current COVID 19 pandemic, our office is severely reducing in office visits until further notice, in order to minimize the risk to our patients and healthcare providers.   Called patient and confirmed a virtual visit for her 5/28 appointment. Patient verbalized understanding of the doxy.me process and I have sent her an e-mail with link and directions as well as my name and office number/hours for reference. Patient understands that she will receive a call from RN to update chart.  Pt understands that although there may be some limitations with this type of visit, we will take all precautions to reduce any security or privacy concerns.  Pt understands that this will be treated like an in office visit and we will file with pt's insurance, and there may be a patient responsible charge related to this service.

## 2019-04-18 NOTE — Telephone Encounter (Signed)
I contacted the pt and was able to update meds, allergies and pmh for 04/19/19 virtual visit. Pt reported she had not received the link for the visit yet and I re sent to her mobile #.  Pt understands to be ready for her appt 30 mins prior.

## 2019-04-19 ENCOUNTER — Encounter: Payer: Self-pay | Admitting: Neurology

## 2019-04-19 ENCOUNTER — Other Ambulatory Visit: Payer: Self-pay

## 2019-04-19 ENCOUNTER — Ambulatory Visit (INDEPENDENT_AMBULATORY_CARE_PROVIDER_SITE_OTHER): Payer: No Typology Code available for payment source | Admitting: Neurology

## 2019-04-19 DIAGNOSIS — G2581 Restless legs syndrome: Secondary | ICD-10-CM

## 2019-04-19 DIAGNOSIS — R5382 Chronic fatigue, unspecified: Secondary | ICD-10-CM | POA: Diagnosis not present

## 2019-04-19 NOTE — Progress Notes (Signed)
     Virtual Visit via Video Note  I connected with Gloria [name] on 04/19/19 at  3:30 PM EDT by a video enabled telemedicine application and verified that I am speaking with the correct person using two identifiers.  Location: Patient: The patient is at home. Provider: Physician is in the office   I discussed the limitations of evaluation and management by telemedicine and the availability of in person appointments. The patient expressed understanding and agreed to proceed.  History of Present Illness: Gloria [name] is a 48 year old right-handed white female with a history of a chronic fatigue issue and history of restless legs.  The patient recent was diagnosed with diabetes, her hemoglobin A1c was 12.  She is now on metformin and a low carbohydrate diet.  The patient has continued to be relatively inactive, she has not engaged in a regular exercise program.  The patient still has to sleep on her right side with her head propped up, if she rolls over, she will wake up gasping.  She is looking for new job with better insurance so she may be able to afford a sleep evaluation in the near future.  Her primary doctor has discussed a low carbohydrate diet and exercise with her.  She takes Sinemet at night for restless leg syndrome, she finds it this is helpful.   Observations/Objective: The video evaluation reveals that the patient is alert and cooperative, speech is well enunciated, not aphasic or dysarthric.  Facial symmetry is present.  Extraocular movements are full.  Assessment and Plan: 1.  Diabetes, new onset  2.  Chronic fatigue  3.  Possible sleep apnea  4.  Restless leg syndrome  The patient will need to get into a regular exercise program, she will start a low carbohydrate diet.  Whenever she feels that she can do it, she will call our office and we will set up a sleep evaluation.  She will otherwise continue the Sinemet, follow-up in 1 year.  Follow Up Instructions: 1  year follow-up, may see nurse practitioner.   I discussed the assessment and treatment plan with the patient. The patient was provided an opportunity to ask questions and all were answered. The patient agreed with the plan and demonstrated an understanding of the instructions.   The patient was advised to call back or seek an in-person evaluation if the symptoms worsen or if the condition fails to improve as anticipated.  I provided 15 minutes of non-face-to-face time during this encounter.   Kathrynn Ducking, MD

## 2019-04-23 ENCOUNTER — Encounter: Payer: Self-pay | Admitting: Family Medicine

## 2019-04-23 ENCOUNTER — Ambulatory Visit: Payer: Self-pay | Admitting: *Deleted

## 2019-04-23 ENCOUNTER — Other Ambulatory Visit: Payer: Self-pay | Admitting: Family Medicine

## 2019-04-23 MED ORDER — GLIPIZIDE 5 MG PO TABS: 5.0000 mg | ORAL_TABLET | Freq: Every day | ORAL | 3 refills | Status: DC

## 2019-04-23 NOTE — Telephone Encounter (Signed)
Please Advise

## 2019-04-23 NOTE — Telephone Encounter (Signed)
Pt stated that she started  taking metformin in 2012 and her developed blurred vision. She quit taking it because of that and her vision got better. She thought she would try taking it again and started about 10 days ago and now her vision has gotten progressively worst. She is not able to read her text messages.  That is the only new medication that she has started to take.that. Pt states no other symptom. She is calling to see if there is another medication that she can take for diabetes. Called [facility] at [facility] [place] and spoke with Dr. Volanda Napoleon' nurse. She will talk to her provider regarding an appointment and medication. Routing to [facility] at [place] for review and recommendation. Reason for Disposition . Caller has URGENT medication question about med that PCP prescribed and triager unable to answer question  Answer Assessment - Initial Assessment Questions 1. SYMPTOMS: "Do you have any symptoms?"     yes 2. SEVERITY: If symptoms are present, ask "Are they mild, moderate or severe?"      severe  Protocols used: MEDICATION QUESTION CALL-A-AH

## 2019-04-23 NOTE — Telephone Encounter (Signed)
Pt should stop metformin.  Rx for glipizide 5 mg sent to pharmacy.  Follow up as discussed at previous OFV.

## 2019-04-24 NOTE — Telephone Encounter (Signed)
Spoke with pt aware of Dr Volanda Napoleon instructions, pt is scheduled for a f/u in 05/16/2019

## 2019-05-08 ENCOUNTER — Other Ambulatory Visit: Payer: Self-pay | Admitting: Family Medicine

## 2019-05-10 ENCOUNTER — Ambulatory Visit: Payer: No Typology Code available for payment source | Admitting: Dietician

## 2019-05-16 ENCOUNTER — Ambulatory Visit (INDEPENDENT_AMBULATORY_CARE_PROVIDER_SITE_OTHER): Payer: No Typology Code available for payment source | Admitting: Family Medicine

## 2019-05-16 ENCOUNTER — Other Ambulatory Visit: Payer: Self-pay

## 2019-05-16 ENCOUNTER — Encounter: Payer: Self-pay | Admitting: Family Medicine

## 2019-05-16 VITALS — BP 122/80 | HR 82 | Temp 98.6°F | Wt 251.0 lb

## 2019-05-16 DIAGNOSIS — R234 Changes in skin texture: Secondary | ICD-10-CM

## 2019-05-16 DIAGNOSIS — E119 Type 2 diabetes mellitus without complications: Secondary | ICD-10-CM

## 2019-05-16 NOTE — Progress Notes (Signed)
Subjective:    Patient ID: [name], female    DOB: 05/03/1971, 48 y.o.   MRN: [id number]  No chief complaint on file.   HPI Patient was seen today for follow-up on diabetes.  Since last office visit pt working to improve diet.  Pt counting carbs, keeping a food diary, and weighing food portions.  Pt taking glipizide 5 mg daily.  Pt tried metformin 500 mg however it caused vision changes.  Patient using an app to help her keep track of her blood sugar and food choices.  FSBS were in the 300s, now 180s-85.  Pt denied symptomatic hypoglycemia.   Several high am fasting bs, pt eating late night snacks (cheese steaks).  Pt feeling better overall.  Pt states her insurance will not cover the cost of a nutrition visit.  She is hoping to get the extra money so she can make the appointment.  Past Medical History:  Diagnosis Date  . Allergy   . Anemia   . Anxiety   . Arthritis   . Bulging disc    X 2  . Chronic active hepatitis ([place])   . Chronic back pain 02/25/2015  . Chronic headaches   . Depression   . Diverticulosis   . Elevated LFTs   . Fibromyalgia   . GERD (gastroesophageal reflux disease)   . History of ETOH abuse   . Hyperlipidemia   . Obese   . Obstructive sleep apnea 02/25/2015  . Osteoarthritis    lower back  . Periodic limb movement disorder 02/25/2014  . Pre-diabetes    pt taking metformin  . RLS (restless legs syndrome)   . Smoker   . Substance abuse (HCC)     Allergies  Allergen Reactions  . Benadryl [Diphenhydramine Hcl] Hives  . Metformin And Related     Blurred vision  . Nuvigil [Armodafinil]     migraines   . Requip [Ropinirole Hcl]     Increased headache    ROS General: Denies fever, chills, night sweats, changes in weight, changes in appetite HEENT: Denies headaches, ear pain, changes in vision, rhinorrhea, sore throat CV: Denies CP, palpitations, SOB, orthopnea Pulm: Denies SOB, cough, wheezing GI: Denies abdominal pain, nausea, vomiting,  diarrhea, constipation GU: Denies dysuria, hematuria, frequency, vaginal discharge Msk: Denies muscle cramps, joint pains Neuro: Denies weakness, numbness, tingling Skin: Denies rashes, bruising Psych: Denies depression, anxiety, hallucinations      Objective:    Blood pressure 122/80, pulse 82, temperature 98.6 F (37 C), temperature source Oral, weight 251 lb (113.9 kg), last menstrual period 04/25/2013, SpO2 98 %.   Gen. Pleasant, well-nourished, in no distress, normal affect  HEENT: [place]/AT, face symmetric, conjunctiva clear, no scleral icterus, PERRLA, EOMI, nares patent without drainage, pharynx without erythema or exudate.  Fissure behind R ear with dry, crusted drainage, no erythema.  R post auricular lymph node  Mildly enlarged, nontender. Lungs: no accessory muscle use, no wheezes or rales Cardiovascular: RRR, no peripheral edema Neuro:  A&Ox3, CN II-XII intact, normal gait Skin:  Warm, no rash.  Fissure behind R ear at crease.   Wt Readings from Last 3 Encounters:  05/16/19 251 lb (113.9 kg)  04/11/19 251 lb (113.9 kg)  12/21/18 269 lb (122 kg)    Lab Results  Component Value Date   WBC 9.0 10/06/2017   HGB 12.7 10/06/2017   HCT 38.9 10/06/2017   PLT 303 10/06/2017   GLUCOSE 80 10/06/2017   CHOL 188 09/03/2016   TRIG 325 (  H) 09/03/2016   HDL 29 (L) 09/03/2016   LDLCALC 94 09/03/2016   ALT 18 10/06/2017   AST 16 10/06/2017   NA 140 10/06/2017   K 4.2 10/06/2017   CL 100 10/06/2017   CREATININE 0.55 (L) 10/06/2017   BUN 6 10/06/2017   CO2 27 10/06/2017   TSH 2.400 10/06/2017   INR 0.99 12/30/2016   HGBA1C 11.9 (A) 04/11/2019    Assessment/Plan:  Type 2 diabetes mellitus without complication, without long-term current use of insulin ([place])  -Improving since diagnosis -Last hemoglobin A1c 11.9% 04/11/2019. -pt congratulated on lifestyle modifications -Continue glipizide 5 mg daily -Continue checking FS BS at home and keep a log to bring with you to  clinic -Follow-up in clinic in 2 to 3 months for repeat hemoglobin A1c  Fissure in skin  -Discussed keeping area clean and dry.  Okay to use Neosporin or triple antibiotic ointment as needed -Discussed padding the ear loop of her face mask or glasses arm so that it does not further irritate the fissure -Avoid pulling on ear as this re-opens the fissure -Continue to monitor  F/u in 2 to 3 months, sooner if needed  Grier Mitts, MD

## 2019-05-22 ENCOUNTER — Other Ambulatory Visit: Payer: Self-pay

## 2019-05-22 ENCOUNTER — Ambulatory Visit
Admission: RE | Admit: 2019-05-22 | Discharge: 2019-05-22 | Disposition: A | Discharge status: Home / Self Care | Payer: No Typology Code available for payment source | Source: Ambulatory Visit | Attending: Family Medicine | Admitting: Family Medicine

## 2019-05-22 DIAGNOSIS — Z1231 Encounter for screening mammogram for malignant neoplasm of breast: Secondary | ICD-10-CM

## 2019-05-31 ENCOUNTER — Ambulatory Visit: Payer: No Typology Code available for payment source

## 2019-06-07 ENCOUNTER — Ambulatory Visit: Payer: No Typology Code available for payment source | Admitting: Registered"

## 2019-06-08 ENCOUNTER — Telehealth: Payer: Self-pay

## 2019-06-08 NOTE — Telephone Encounter (Signed)
  Spoke with [name] states that she called earlier this morning and requested for a call from our office, Message was not delivered and [name] called again this afternoon wanting to have a virtual visit with Dr Volanda Napoleon for a rash she has on her right side between her thigh and her stomach [name] states that the rash is itcy and achy, [name] has tried a cream that she has for a previous skin condition with no success, Due to no availability in the our office/ Saturday clinic, [name] was advised to go to [facility] for evaluation. Verbalized understanding. Phone note routed to Dr Volanda Napoleon for review.

## 2019-06-09 ENCOUNTER — Ambulatory Visit (INDEPENDENT_AMBULATORY_CARE_PROVIDER_SITE_OTHER): Payer: No Typology Code available for payment source | Admitting: Family Medicine

## 2019-06-09 ENCOUNTER — Encounter: Payer: Self-pay | Admitting: Family Medicine

## 2019-06-09 ENCOUNTER — Other Ambulatory Visit: Payer: Self-pay

## 2019-06-09 VITALS — Temp 97.2°F

## 2019-06-09 DIAGNOSIS — R21 Rash and other nonspecific skin eruption: Secondary | ICD-10-CM | POA: Diagnosis not present

## 2019-06-09 DIAGNOSIS — IMO0001 Reserved for inherently not codable concepts without codable children: Secondary | ICD-10-CM

## 2019-06-09 DIAGNOSIS — B372 Candidiasis of skin and nail: Secondary | ICD-10-CM | POA: Diagnosis not present

## 2019-06-09 DIAGNOSIS — E1165 Type 2 diabetes mellitus with hyperglycemia: Secondary | ICD-10-CM

## 2019-06-09 MED ORDER — CLOTRIMAZOLE-BETAMETHASONE 1-0.05 % EX CREA: 1.0000 "application " | TOPICAL_CREAM | Freq: Two times a day (BID) | CUTANEOUS | 1 refills | Status: DC

## 2019-06-09 NOTE — Patient Instructions (Signed)
Health Maintenance Due  Topic Date Due  . PNEUMOCOCCAL POLYSACCHARIDE VACCINE AGE 49-64 HIGH RISK  01/21/1972  . OPHTHALMOLOGY EXAM  01/21/1980  . URINE MICROALBUMIN  01/21/1980  . HIV Screening  01/20/1985  . PAP SMEAR-Modifier  10/21/2018    Depression screen PHQ 2/9 04/10/2015  Decreased Interest 0  Down, Depressed, Hopeless 0  PHQ - 2 Score 0

## 2019-06-09 NOTE — Progress Notes (Signed)
Phone [phone]  I acted as a Education administrator for Dr. Jenkins Rouge, LPN Subjective:  Virtual visit via Video note. Chief complaint: Chief Complaint  Patient presents with  . Rash   This visit type was conducted due to national recommendations for restrictions regarding the COVID-19 Pandemic (e.g. social distancing).  This format is felt to be most appropriate for this patient at this time balancing risks to patient and risks to population by having him in for in person visit.  No physical exam was performed (except for noted visual exam or audio findings with Telehealth visits).    Our team/I connected with Roselind Rily Sayed at 10:00 AM EDT by a video enabled telemedicine application (doxy.me or caregility through epic) and verified that I am speaking with the correct person using two identifiers.  Location patient: Home-[place] Location provider: [facility], office Persons participating in the virtual visit:  patient  Our team/I discussed the limitations of evaluation and management by telemedicine and the availability of in person appointments. In light of current covid-19 pandemic, patient also understands that we are trying to protect them by minimizing in office contact if at all possible.  The patient expressed consent for telemedicine visit and agreed to proceed. Patient understands insurance will be billed.   ROS-  See ROS below  Past Medical History-  Patient Active Problem List   Diagnosis Date Noted  . Uncontrolled diabetes mellitus without complication, without long-term current use of insulin ([place]) 04/06/2019  . Obstructive sleep apnea 02/25/2015  . Chronic back pain 02/25/2015  . Periodic limb movement disorder 02/25/2014  . Restless legs syndrome 12/15/2013  . Cardiac murmur 10/16/2013  . Snoring 10/16/2013  . Hx of migraine headaches 10/16/2013  . Fluid retention 10/12/2013  . Weight gain 10/12/2013  . Anxiety 10/12/2013  . Alcoholic liver disease  ([place]) 10/28/2012  . Ex-cigarette smoker 10/28/2012  . Fibromyalgia 10/27/2012  . Depression with anxiety 10/27/2012  . GERD (gastroesophageal reflux disease) 10/27/2012  . H/O ETOH abuse 10/27/2012    Medications- reviewed and updated Current Outpatient Medications  Medication Sig Dispense Refill  . B Complex Vitamins (B COMPLEX PO) Take 1 tablet by mouth daily.    . blood glucose meter kit and supplies KIT Dispense based on patient and insurance preference. Use up to four times daily as directed. (FOR ICD-9 250.00, 250.01). 1 each 0  . Blood Glucose Monitoring Suppl ([place]) w/Device KIT Use to check Blood sugars twice daily before meals 1 kit 0  . Calcium Carbonate-Vitamin D (CALCIUM 600/VITAMIN D) 600-400 MG-UNIT per tablet Take 1 tablet by mouth 2 (two) times daily.     . Carbidopa-Levodopa ER (SINEMET CR) 25-100 MG tablet controlled release TAKE 1 TABLET BY MOUTH AT  BEDTIME 90 tablet 1  . Cholecalciferol (VITAMIN D PO) Take by mouth. Takes 5000units  By mouth daily.    . Coenzyme Q10 (COQ10) 100 MG CAPS Take 100 mg by mouth daily.    [name] EQUETRO 200 MG CP12 12 hr capsule     . estradiol (CLIMARA - DOSED IN MG/24 HR) 0.05 mg/24hr patch APPLY 1 PATCH TOPICALLY TO  SKIN ONCE A WEEK 12 patch 4  . glipiZIDE (GLUCOTROL) 5 MG tablet Take 1 tablet (5 mg total) by mouth daily before breakfast. 30 tablet 3  . ibuprofen (ADVIL,MOTRIN) 800 MG tablet Take 1 tablet (800 mg total) by mouth every 8 (eight) hours as needed for moderate pain. 30 tablet 3  . Lancets [facility]  PLUS LANCET30G) MISC USE 1  TO CHECK GLUCOSE 4 TIMES DAILY 100 each 0  . loratadine (CLARITIN) 10 MG tablet Take 10 mg by mouth daily.    [name] LORazepam (ATIVAN) 1 MG tablet Take 1 mg by mouth at bedtime.     . Multiple Vitamins-Minerals (MULTIVITAMIN PO) Take 1 tablet by mouth daily.     [name] nystatin (MYCOSTATIN/NYSTOP) powder Apply topically 2 (two) times daily. 15 g 2  . OMEGA 3-6-9 FATTY ACIDS PO Take 500  mg by mouth daily.    Glory Rosebush VERIO test strip USE 1 STRIP TO CHECK GLUCOSE 4 TIMES DAILY 100 each 0  . PROAIR HFA 108 (90 BASE) MCG/ACT inhaler Inhale 1 puff into the lungs every 4 (four) hours as needed for wheezing or shortness of breath.     [name] PROTONIX 40 MG tablet Take 1 capsule by mouth daily.    [name] REXULTI 3 MG TABS Take 1 tablet by mouth daily.  1  . rizatriptan (MAXALT) 10 MG tablet Take 1 tablet (10 mg total) by mouth as needed for migraine. May repeat in 2 hours if needed 10 tablet 5  . thiamine (VITAMIN B-1) 50 MG tablet Take 50 mg by mouth daily.    . vitamin C (ASCORBIC ACID) 500 MG tablet Take 500 mg by mouth daily.    . Vitamin D, Ergocalciferol, (DRISDOL) 1.25 MG (50000 UT) CAPS capsule Take 50,000 Units by mouth every 7 (seven) days.    . clotrimazole-betamethasone (LOTRISONE) cream Apply 1 application topically 2 (two) times daily. For 10 days maximum 45 g 1   No current facility-administered medications for this visit.      Objective:  Temp (!) 97.2 F (36.2 C) (Oral)   LMP 04/25/2013  self reported vitals Gen: NAD, resting comfortably Lungs: nonlabored, normal respiratory rate  Skin: appears dry, no obvious rash on visible skin- we did not examine groin as I do not have a chaperone today    Assessment and Plan   # Rash/poorly controlled diabetes S: Pt c/o rash off and on x 1 month right groin area. The are is red and itching some, used cortisone last night the area is not as red today. Pt tried to use Nystatin powder she had and it made the area worse.   Area in total was about the size of her hand and when she took nystatin seemed to double in size.   She does report sugars are improving and in the mornings between 112-130.   ROS-not ill appearing, no fever/chills. Did start glipizide before this.  Not immunocompromised. Other than possibly from poorly controlled diabetes No mucus membrane involvement.  A/P: Rash in groin with satellite lesions- likely  candidal intertrigo- did not respond well to nystatin - will try lotrisone as she had some relief with cortisone and want to cover inflammation as well as fungus -follow up with Dr. Volanda Napoleon if symptoms worsen or fail to improve in the 10 day course.   We discussed how poorly controlled diabetes and obesity may increase risks of fungal infections and skin irritation- she is working hard to improve diabetes and sounds like fasting #s drastically improved- encouraged her continued efforts and follow up with Dr. Volanda Napoleon  Recommended follow up: prn for acute issue Future Appointments  Date Time Provider [place]  07/18/2019  1:30 PM Billie Ruddy, MD [facility]  11/01/2019  4:00 PM Princess Bruins, MD [facility]  04/17/2020 11:15 AM Suzzanne Cloud, NP [facility] None  Lab/Order associations:   ICD-10-CM   1. Rash  R21    Meds ordered this encounter  Medications  . clotrimazole-betamethasone (LOTRISONE) cream    Sig: Apply 1 application topically 2 (two) times daily. For 10 days maximum    Dispense:  45 g    Refill:  1    Return precautions advised.  Garret Reddish, MD

## 2019-06-13 ENCOUNTER — Other Ambulatory Visit: Payer: Self-pay | Admitting: Family Medicine

## 2019-06-25 ENCOUNTER — Encounter: Payer: Self-pay | Admitting: Internal Medicine

## 2019-06-25 ENCOUNTER — Telehealth (INDEPENDENT_AMBULATORY_CARE_PROVIDER_SITE_OTHER): Payer: No Typology Code available for payment source | Admitting: Internal Medicine

## 2019-06-25 ENCOUNTER — Other Ambulatory Visit: Payer: Self-pay

## 2019-06-25 DIAGNOSIS — B9789 Other viral agents as the cause of diseases classified elsewhere: Secondary | ICD-10-CM | POA: Diagnosis not present

## 2019-06-25 DIAGNOSIS — J069 Acute upper respiratory infection, unspecified: Secondary | ICD-10-CM

## 2019-06-25 NOTE — Progress Notes (Signed)
Virtual Visit via Video Note  I connected with@ on 06/25/19 at  3:45 PM EDT by a video enabled telemedicine application and verified that I am speaking with the correct person using two identifiers. Location patient: home Location provider:work office Persons participating in the virtual visit: patient, provider  [place] national recommendations  regarding COVID 19 pandemic   video visit is advised over in office visit for this patient.  Patient aware  of the limitations of evaluation and management by telemedicine and  availability of in person appointments. and agreed to proceed.   HPI: Gloria [name] presents for video visit PCP NA  Onset 3 days    go  With nasal congestion and then achy tired and fever yesterday 100  But not today  Nasal congestion and cough but no sob and not like bronchitis   Worse than a cold but not as sick as flu. Son age 30 had sx  That was short lived a few days  Before . He is better  .  Has stayed away from parents  For the past week   She was at the [employer] topsail and no crowds  She works from home   But off to rest  Has tried mucinex dm  Not that helpful    High risk conditions  See problem list  ROS: See pertinent positives and negatives per HPI.  Past Medical History:  Diagnosis Date  . Allergy   . Anemia   . Anxiety   . Arthritis   . Bulging disc    X 2  . Chronic active hepatitis ([place])   . Chronic back pain 02/25/2015  . Chronic headaches   . Depression   . Diverticulosis   . Elevated LFTs   . Fibromyalgia   . GERD (gastroesophageal reflux disease)   . History of ETOH abuse   . Hyperlipidemia   . Obese   . Obstructive sleep apnea 02/25/2015  . Osteoarthritis    lower back  . Periodic limb movement disorder 02/25/2014  . Pre-diabetes    pt taking metformin  . RLS (restless legs syndrome)   . Smoker   . Substance abuse [facility])     Past Surgical History:  Procedure Laterality Date  . CERVICAL FUSION    . CYSTOSCOPY   06/06/2013   Procedure: CYSTOSCOPY;  Surgeon: Terrance Mass, MD;  Location: [facility];  Service: Gynecology;;  . INTRAUTERINE DEVICE INSERTION  08/09/2008   PARAGUARD  . kidney surgery  1995   abcess  . LAPAROSCOPY N/A 06/06/2013   Procedure: LAPAROSCOPY DIAGNOSTIC;  Surgeon: Terrance Mass, MD;  Location: [facility];  Service: Gynecology;  Laterality: N/A;  . LAPAROTOMY  06/06/2013   Procedure: EXPLORATORY LAPAROTOMY;  Surgeon: Terrance Mass, MD;  Location: [facility];  Service: Gynecology;;  . LIVER BIOPSY    . radial tunnel Bilateral 2005  . VAGINAL HYSTERECTOMY Left 06/06/2013   Procedure: Vaginal Hysterectomy with Patiial Right Salpingectomy;  Surgeon: Terrance Mass, MD;  Location: [facility];  Service: Gynecology;  Laterality: Left;    Family History  Problem Relation Age of Onset  . Breast cancer Mother   . Colon polyps Father   . Diabetes Father   . Cirrhosis Father   . Irritable bowel syndrome Sister   . Breast cancer Maternal Aunt   . Breast cancer Paternal Grandmother   . Colon cancer Paternal Grandmother   . Esophageal cancer Paternal Grandfather   . Stomach cancer Paternal Grandfather  Social History   Tobacco Use  . Smoking status: Former Smoker    Packs/day: 1.00    Quit date: 01/26/2012    Years since quitting: 7.4  . Smokeless tobacco: Never Used  Substance Use Topics  . Alcohol use: No    Alcohol/week: 0.0 standard drinks    Comment: History of alcohol abuse  . Drug use: No      Current Outpatient Medications:  .  B Complex Vitamins (B COMPLEX PO), Take 1 tablet by mouth daily., Disp: , Rfl:  .  blood glucose meter kit and supplies KIT, Dispense based on patient and insurance preference. Use up to four times daily as directed. (FOR ICD-9 250.00, 250.01)., Disp: 1 each, Rfl: 0 .  Blood Glucose Monitoring Suppl (ONETOUCH VERIO FLEX SYSTEM) w/Device KIT, Use to check Blood sugars twice daily before meals, Disp: 1 kit, Rfl: 0 .  Calcium Carbonate-Vitamin D  (CALCIUM 600/VITAMIN D) 600-400 MG-UNIT per tablet, Take 1 tablet by mouth 2 (two) times daily. , Disp: , Rfl:  .  Carbidopa-Levodopa ER (SINEMET CR) 25-100 MG tablet controlled release, TAKE 1 TABLET BY MOUTH AT  BEDTIME, Disp: 90 tablet, Rfl: 1 .  Cholecalciferol (VITAMIN D PO), Take by mouth. Takes 5000units  By mouth daily., Disp: , Rfl:  .  clotrimazole-betamethasone (LOTRISONE) cream, Apply 1 application topically 2 (two) times daily. For 10 days maximum, Disp: 45 g, Rfl: 1 .  Coenzyme Q10 (COQ10) 100 MG CAPS, Take 100 mg by mouth daily., Disp: , Rfl:  .  EQUETRO 200 MG CP12 12 hr capsule, , Disp: , Rfl:  .  estradiol (CLIMARA - DOSED IN MG/24 HR) 0.05 mg/24hr patch, APPLY 1 PATCH TOPICALLY TO  SKIN ONCE A WEEK, Disp: 12 patch, Rfl: 4 .  glipiZIDE (GLUCOTROL) 5 MG tablet, Take 1 tablet (5 mg total) by mouth daily before breakfast., Disp: 30 tablet, Rfl: 3 .  ibuprofen (ADVIL,MOTRIN) 800 MG tablet, Take 1 tablet (800 mg total) by mouth every 8 (eight) hours as needed for moderate pain., Disp: 30 tablet, Rfl: 3 .  Lancets (ONETOUCH DELICA PLUS [id number]) MISC, USE 1  TO CHECK GLUCOSE 4 TIMES DAILY, Disp: 100 each, Rfl: 0 .  loratadine (CLARITIN) 10 MG tablet, Take 10 mg by mouth daily., Disp: , Rfl:  .  LORazepam (ATIVAN) 1 MG tablet, Take 1 mg by mouth at bedtime. , Disp: , Rfl:  .  Multiple Vitamins-Minerals (MULTIVITAMIN PO), Take 1 tablet by mouth daily. , Disp: , Rfl:  .  nystatin (MYCOSTATIN/NYSTOP) powder, Apply topically 2 (two) times daily., Disp: 15 g, Rfl: 2 .  OMEGA 3-6-9 FATTY ACIDS PO, Take 500 mg by mouth daily., Disp: , Rfl:  .  ONETOUCH VERIO test strip, USE 1 STRIP TO CHECK GLUCOSE 4 TIMES DAILY, Disp: 100 each, Rfl: 0 .  PROAIR HFA 108 (90 BASE) MCG/ACT inhaler, Inhale 1 puff into the lungs every 4 (four) hours as needed for wheezing or shortness of breath. , Disp: , Rfl:  .  PROTONIX 40 MG tablet, Take 1 capsule by mouth daily., Disp: , Rfl:  .  REXULTI 3 MG TABS, Take 1  tablet by mouth daily., Disp: , Rfl: 1 .  rizatriptan (MAXALT) 10 MG tablet, Take 1 tablet (10 mg total) by mouth as needed for migraine. May repeat in 2 hours if needed, Disp: 10 tablet, Rfl: 5 .  thiamine (VITAMIN B-1) 50 MG tablet, Take 50 mg by mouth daily., Disp: , Rfl:  .  vitamin C (  ASCORBIC ACID) 500 MG tablet, Take 500 mg by mouth daily., Disp: , Rfl:  .  Vitamin D, Ergocalciferol, (DRISDOL) 1.25 MG (50000 UT) CAPS capsule, Take 50,000 Units by mouth every 7 (seven) days., Disp: , Rfl:   EXAM: BP Readings from Last 3 Encounters:  05/16/19 122/80  04/11/19 126/82  12/21/18 128/82    VITALS per patient if applicable:very congested   But nl speech and affect non toxic  GENERAL: alert, oriented, appears well and in no acute distress ocass cough  HEENT: atraumatic, conjunttiva clear, no obvious abnormalities on inspection of external nose and ears NECK: normal movements of the head and neck LUNGS: on inspection no signs of respiratory distress, breathing rate appears normal, no obvious gross SOB, gasping or wheezing CV: no obvious cyanosisPSYCH/NEURO: pleasant and cooperative, no obvious depression or anxiety, speech and thought processing grossly intact   ASSESSMENT AND PLAN:  Discussed the following assessment and plan:    ICD-10-CM   1. Viral upper respiratory tract infection with cough  J06.9    B97.89    acts  like a cold  uncomplicated  son had uri and is better  no other exposrues    Acts like viral resp infection uncomplicated  Disc  She could get covid 19 testing but she says and I agree that not much reason to do at this time  She will isolate from her parents   Per guidelines  Counseled.   Get back with alarm sx pain sob  Persistent fever also and disc  Sx r   Can use sx rx afrin at  Night for a few nights  Can [place] inhaler  If needed  Let us know if wants to be tested for covid realizing  5-7 days turnover time   Expectant management and discussion of plan and treatment  with opportunity to ask questions and all were answered. The patient agreed with the plan and demonstrated an understanding of the instructions.   Advised to call back or seek an in-person evaluation if worsening  or having  further concerns .     Shanon Ace, MD

## 2019-06-26 ENCOUNTER — Other Ambulatory Visit: Payer: Self-pay

## 2019-06-26 ENCOUNTER — Telehealth: Payer: Self-pay

## 2019-06-26 MED ORDER — BENZONATATE 100 MG PO CAPS: 100.0000 mg | ORAL_CAPSULE | Freq: Two times a day (BID) | ORAL | 0 refills | Status: DC | PRN

## 2019-06-26 NOTE — Telephone Encounter (Signed)
Please advise pt needs note and states she did not sleep  Much   Copied from [place] [phone]. Topic: General - Other >> Jun 26, 2019  9:17 AM Antonieta Iba C wrote: Reason for CRM: pt called in to update provider. Pt says that she coughed all night. Pt would also like to have a work note excusing her.   Please assist.

## 2019-06-26 NOTE — Telephone Encounter (Signed)
Please get her a work note to excuse her for yesterday   Return thruway if feeling better .   If she want to try we can send in tessalon perles to see if helps her cough at all

## 2019-06-26 NOTE — Telephone Encounter (Signed)
Pt has been informed tessalon perles sent in and letter sent via mychart

## 2019-07-13 ENCOUNTER — Other Ambulatory Visit: Payer: Self-pay | Admitting: Family Medicine

## 2019-07-13 DIAGNOSIS — M5136 Other intervertebral disc degeneration, lumbar region: Secondary | ICD-10-CM

## 2019-07-18 ENCOUNTER — Other Ambulatory Visit: Payer: Self-pay

## 2019-07-18 ENCOUNTER — Encounter: Payer: Self-pay | Admitting: Family Medicine

## 2019-07-18 ENCOUNTER — Ambulatory Visit: Payer: No Typology Code available for payment source | Admitting: Family Medicine

## 2019-07-18 VITALS — BP 110/76 | HR 74 | Temp 99.9°F | Wt 240.0 lb

## 2019-07-18 DIAGNOSIS — E559 Vitamin D deficiency, unspecified: Secondary | ICD-10-CM | POA: Diagnosis not present

## 2019-07-18 DIAGNOSIS — Z6841 Body Mass Index (BMI) 40.0 and over, adult: Secondary | ICD-10-CM

## 2019-07-18 DIAGNOSIS — E119 Type 2 diabetes mellitus without complications: Secondary | ICD-10-CM | POA: Diagnosis not present

## 2019-07-18 DIAGNOSIS — Z0000 Encounter for general adult medical examination without abnormal findings: Secondary | ICD-10-CM

## 2019-07-18 DIAGNOSIS — Z23 Encounter for immunization: Secondary | ICD-10-CM | POA: Diagnosis not present

## 2019-07-18 DIAGNOSIS — L03818 Cellulitis of other sites: Secondary | ICD-10-CM

## 2019-07-18 LAB — CBC WITH DIFFERENTIAL/PLATELET
Basophils Absolute: 0.1 10*3/uL (ref 0.0–0.1)
Basophils Relative: 0.7 % (ref 0.0–3.0)
Eosinophils Absolute: 0.1 10*3/uL (ref 0.0–0.7)
Eosinophils Relative: 1.2 % (ref 0.0–5.0)
HCT: 41.3 % (ref 36.0–46.0)
Hemoglobin: 13.5 g/dL (ref 12.0–15.0)
Lymphocytes Relative: 32.7 % (ref 12.0–46.0)
Lymphs Abs: 3.6 10*3/uL (ref 0.7–4.0)
MCHC: 32.8 g/dL (ref 30.0–36.0)
MCV: 88.7 fl (ref 78.0–100.0)
Monocytes Absolute: 0.7 10*3/uL (ref 0.1–1.0)
Monocytes Relative: 6.7 % (ref 3.0–12.0)
Neutro Abs: 6.5 10*3/uL (ref 1.4–7.7)
Neutrophils Relative %: 58.7 % (ref 43.0–77.0)
Platelets: 331 10*3/uL (ref 150.0–400.0)
RBC: 4.66 Mil/uL (ref 3.87–5.11)
RDW: 14.7 % (ref 11.5–15.5)
WBC: 11.1 10*3/uL — ABNORMAL HIGH (ref 4.0–10.5)

## 2019-07-18 LAB — COMPREHENSIVE METABOLIC PANEL
ALT: 19 U/L (ref 0–35)
AST: 14 U/L (ref 0–37)
Albumin: 4.4 g/dL (ref 3.5–5.2)
Alkaline Phosphatase: 78 U/L (ref 39–117)
BUN: 11 mg/dL (ref 6–23)
CO2: 26 mEq/L (ref 19–32)
Calcium: 9.5 mg/dL (ref 8.4–10.5)
Chloride: 101 mEq/L (ref 96–112)
Creatinine, Ser: 0.57 mg/dL (ref 0.40–1.20)
GFR: 112.51 mL/min (ref >60.00)
Glucose, Bld: 55 mg/dL — ABNORMAL LOW (ref 70–99)
Potassium: 4 mEq/L (ref 3.5–5.1)
Sodium: 138 mEq/L (ref 135–145)
Total Bilirubin: 0.3 mg/dL (ref 0.2–1.2)
Total Protein: 7.4 g/dL (ref 6.0–8.3)

## 2019-07-18 LAB — VITAMIN D 25 HYDROXY (VIT D DEFICIENCY, FRACTURES): VITD: 57.79 ng/mL (ref 30.00–100.00)

## 2019-07-18 LAB — LIPID PANEL
Cholesterol: 244 mg/dL — ABNORMAL HIGH (ref 0–200)
HDL: 38.6 mg/dL — ABNORMAL LOW (ref >39.00)
NonHDL: 205.84
Total CHOL/HDL Ratio: 6
Triglycerides: 311 mg/dL — ABNORMAL HIGH (ref 0.0–149.0)
VLDL: 62.2 mg/dL — ABNORMAL HIGH (ref 0.0–40.0)

## 2019-07-18 LAB — LDL CHOLESTEROL, DIRECT: Direct LDL: 145 mg/dL

## 2019-07-18 LAB — HEMOGLOBIN A1C: Hgb A1c MFr Bld: 6.1 % (ref 4.6–6.5)

## 2019-07-18 MED ORDER — SULFAMETHOXAZOLE-TRIMETHOPRIM 800-160 MG PO TABS: 1.0000 | ORAL_TABLET | Freq: Two times a day (BID) | ORAL | 0 refills | Status: AC

## 2019-07-18 NOTE — Addendum Note (Signed)
Addended by: Wyvonne Lenz on: 07/18/2019 02:47 PM   Modules accepted: Orders

## 2019-07-18 NOTE — Patient Instructions (Addendum)
Preventive Care 40-49 Years Old, Female Preventive care refers to visits with your health care provider and lifestyle choices that can promote health and wellness. This includes:  A yearly physical exam. This may also be called an annual well check.  Regular dental visits and eye exams.  Immunizations.  Screening for certain conditions.  Healthy lifestyle choices, such as eating a healthy diet, getting regular exercise, not using drugs or products that contain nicotine and tobacco, and limiting alcohol use. What can I expect for my preventive care visit? Physical exam Your health care provider will check your:  Height and weight. This may be used to calculate body mass index (BMI), which tells if you are at a healthy weight.  Heart rate and blood pressure.  Skin for abnormal spots. Counseling Your health care provider may ask you questions about your:  Alcohol, tobacco, and drug use.  Emotional well-being.  Home and relationship well-being.  Sexual activity.  Eating habits.  Work and work Statistician.  Method of birth control.  Menstrual cycle.  Pregnancy history. What immunizations do I need?  Influenza (flu) vaccine  This is recommended every year. Tetanus, diphtheria, and pertussis (Tdap) vaccine  You may need a Td booster every 10 years. Varicella (chickenpox) vaccine  You may need this if you have not been vaccinated. Zoster (shingles) vaccine  You may need this after age 80. Measles, mumps, and rubella (MMR) vaccine  You may need at least one dose of MMR if you were born in 1957 or later. You may also need a second dose. Pneumococcal conjugate (PCV13) vaccine  You may need this if you have certain conditions and were not previously vaccinated. Pneumococcal polysaccharide (PPSV23) vaccine  You may need one or two doses if you smoke cigarettes or if you have certain conditions. Meningococcal conjugate (MenACWY) vaccine  You may need this if you  have certain conditions. Hepatitis A vaccine  You may need this if you have certain conditions or if you travel or work in places where you may be exposed to hepatitis A. Hepatitis B vaccine  You may need this if you have certain conditions or if you travel or work in places where you may be exposed to hepatitis B. Haemophilus influenzae type b (Hib) vaccine  You may need this if you have certain conditions. Human papillomavirus (HPV) vaccine  If recommended by your health care provider, you may need three doses over 6 months. You may receive vaccines as individual doses or as more than one vaccine together in one shot (combination vaccines). Talk with your health care provider about the risks and benefits of combination vaccines. What tests do I need? Blood tests  Lipid and cholesterol levels. These may be checked every 5 years, or more frequently if you are over 55 years old.  Hepatitis C test.  Hepatitis B test. Screening  Lung cancer screening. You may have this screening every year starting at age 13 if you have a 30-pack-year history of smoking and currently smoke or have quit within the past 15 years.  Colorectal cancer screening. All adults should have this screening starting at age 104 and continuing until age 61. Your health care provider may recommend screening at age 40 if you are at increased risk. You will have tests every 1-10 years, depending on your results and the type of screening test.  Diabetes screening. This is done by checking your blood sugar (glucose) after you have not eaten for a while (fasting). You may have this  done every 1-3 years.  Mammogram. This may be done every 1-2 years. Talk with your health care provider about when you should start having regular mammograms. This may depend on whether you have a family history of breast cancer.  BRCA-related cancer screening. This may be done if you have a family history of breast, ovarian, tubal, or peritoneal  cancers.  Pelvic exam and Pap test. This may be done every 3 years starting at age 21. Starting at age 30, this may be done every 5 years if you have a Pap test in combination with an HPV test. Other tests  Sexually transmitted disease (STD) testing.  Bone density scan. This is done to screen for osteoporosis. You may have this scan if you are at high risk for osteoporosis. Follow these instructions at home: Eating and drinking  Eat a diet that includes fresh fruits and vegetables, whole grains, lean protein, and low-fat dairy.  Take vitamin and mineral supplements as recommended by your health care provider.  Do not drink alcohol if: ? Your health care provider tells you not to drink. ? You are pregnant, may be pregnant, or are planning to become pregnant.  If you drink alcohol: ? Limit how much you have to 0-1 drink a day. ? Be aware of how much alcohol is in your drink. In the [place] one drink equals one 12 oz bottle of beer (355 mL), one 5 oz glass of wine (148 mL), or one 1 oz glass of hard liquor (44 mL). Lifestyle  Take daily care of your teeth and gums.  Stay active. Exercise for at least 30 minutes on 5 or more days each week.  Do not use any products that contain nicotine or tobacco, such as cigarettes, e-cigarettes, and chewing tobacco. If you need help quitting, ask your health care provider.  If you are sexually active, practice safe sex. Use a condom or other form of birth control (contraception) in order to prevent pregnancy and STIs (sexually transmitted infections).  If told by your health care provider, take low-dose aspirin daily starting at age 50. What's next?  Visit your health care provider once a year for a well check visit.  Ask your health care provider how often you should have your eyes and teeth checked.  Stay up to date on all vaccines. This information is not intended to replace advice given to you by your health care provider. Make sure you  discuss any questions you have with your health care provider. Document Released: 12/05/2015 Document Revised: 07/20/2018 Document Reviewed: 07/20/2018 Elsevier Patient Education  2020 [employer]  Preventive Care 40-64 Years Old, Female Preventive care refers to visits with your health care provider and lifestyle choices that can promote health and wellness. This includes:  A yearly physical exam. This may also be called an annual well check.  Regular dental visits and eye exams.  Immunizations.  Screening for certain conditions.  Healthy lifestyle choices, such as eating a healthy diet, getting regular exercise, not using drugs or products that contain nicotine and tobacco, and limiting alcohol use. What can I expect for my preventive care visit? Physical exam Your health care provider will check your:  Height and weight. This may be used to calculate body mass index (BMI), which tells if you are at a healthy weight.  Heart rate and blood pressure.  Skin for abnormal spots. Counseling Your health care provider may ask you questions about your:  Alcohol, tobacco, and drug use.  Emotional well-being.    Home and relationship well-being.  Sexual activity.  Eating habits.  Work and work Statistician.  Method of birth control.  Menstrual cycle.  Pregnancy history. What immunizations do I need?  Influenza (flu) vaccine  This is recommended every year. Tetanus, diphtheria, and pertussis (Tdap) vaccine  You may need a Td booster every 10 years. Varicella (chickenpox) vaccine  You may need this if you have not been vaccinated. Zoster (shingles) vaccine  You may need this after age 48. Measles, mumps, and rubella (MMR) vaccine  You may need at least one dose of MMR if you were born in 1957 or later. You may also need a second dose. Pneumococcal conjugate (PCV13) vaccine  You may need this if you have certain conditions and were not previously vaccinated.  Pneumococcal polysaccharide (PPSV23) vaccine  You may need one or two doses if you smoke cigarettes or if you have certain conditions. Meningococcal conjugate (MenACWY) vaccine  You may need this if you have certain conditions. Hepatitis A vaccine  You may need this if you have certain conditions or if you travel or work in places where you may be exposed to hepatitis A. Hepatitis B vaccine  You may need this if you have certain conditions or if you travel or work in places where you may be exposed to hepatitis B. Haemophilus influenzae type b (Hib) vaccine  You may need this if you have certain conditions. Human papillomavirus (HPV) vaccine  If recommended by your health care provider, you may need three doses over 6 months. You may receive vaccines as individual doses or as more than one vaccine together in one shot (combination vaccines). Talk with your health care provider about the risks and benefits of combination vaccines. What tests do I need? Blood tests  Lipid and cholesterol levels. These may be checked every 5 years, or more frequently if you are over 31 years old.  Hepatitis C test.  Hepatitis B test. Screening  Lung cancer screening. You may have this screening every year starting at age 24 if you have a 30-pack-year history of smoking and currently smoke or have quit within the past 15 years.  Colorectal cancer screening. All adults should have this screening starting at age 54 and continuing until age 1. Your health care provider may recommend screening at age 74 if you are at increased risk. You will have tests every 1-10 years, depending on your results and the type of screening test.  Diabetes screening. This is done by checking your blood sugar (glucose) after you have not eaten for a while (fasting). You may have this done every 1-3 years.  Mammogram. This may be done every 1-2 years. Talk with your health care provider about when you should start having  regular mammograms. This may depend on whether you have a family history of breast cancer.  BRCA-related cancer screening. This may be done if you have a family history of breast, ovarian, tubal, or peritoneal cancers.  Pelvic exam and Pap test. This may be done every 3 years starting at age 45. Starting at age 28, this may be done every 5 years if you have a Pap test in combination with an HPV test. Other tests  Sexually transmitted disease (STD) testing.  Bone density scan. This is done to screen for osteoporosis. You may have this scan if you are at high risk for osteoporosis. Follow these instructions at home: Eating and drinking  Eat a diet that includes fresh fruits and vegetables, whole grains, lean  protein, and low-fat dairy.  Take vitamin and mineral supplements as recommended by your health care provider.  Do not drink alcohol if: ? Your health care provider tells you not to drink. ? You are pregnant, may be pregnant, or are planning to become pregnant.  If you drink alcohol: ? Limit how much you have to 0-1 drink a day. ? Be aware of how much alcohol is in your drink. In the [place] one drink equals one 12 oz bottle of beer (355 mL), one 5 oz glass of wine (148 mL), or one 1 oz glass of hard liquor (44 mL). Lifestyle  Take daily care of your teeth and gums.  Stay active. Exercise for at least 30 minutes on 5 or more days each week.  Do not use any products that contain nicotine or tobacco, such as cigarettes, e-cigarettes, and chewing tobacco. If you need help quitting, ask your health care provider.  If you are sexually active, practice safe sex. Use a condom or other form of birth control (contraception) in order to prevent pregnancy and STIs (sexually transmitted infections).  If told by your health care provider, take low-dose aspirin daily starting at age 1. What's next?  Visit your health care provider once a year for a well check visit.  Ask your health care  provider how often you should have your eyes and teeth checked.  Stay up to date on all vaccines. This information is not intended to replace advice given to you by your health care provider. Make sure you discuss any questions you have with your health care provider. Document Released: 12/05/2015 Document Revised: 07/20/2018 Document Reviewed: 07/20/2018 Elsevier Patient Education  [place].  Diabetes Mellitus and Sick Day Management Blood sugar (glucose) can be difficult to control when you are sick. Common illnesses that can cause problems for people with diabetes (diabetes mellitus) include colds, fever, flu (influenza), nausea, vomiting, and diarrhea. These illnesses can cause stress and loss of body fluids (dehydration), and those issues can cause blood glucose levels to increase. Because of this, it is very important to take your insulin and diabetes medicines and eat some form of carbohydrate when you are sick. You should make a plan for days when you are sick (sick day plan) as part of your diabetes management plan. You and your health care provider should make this plan in advance. The following guidelines are intended to help you manage an illness that lasts for about 24 hours or less. Your health care provider may also give you more specific instructions. What do I need to do to manage my blood glucose?   Check your blood glucose every 2-4 hours, or as often as told by your health care provider.  Know your sick day treatment goals. Your target blood glucose levels may be different when you are sick.  If you use insulin, take your usual dose. ? If your blood glucose continues to be too high, you may need to take an additional insulin dose as told by your health care provider.  If you use oral diabetes medicine, you may need to stop taking it if you are not able to eat or drink normally. Ask your health care provider about whether you need to stop taking these medicines while  you are sick.  If you use injectable hormone medicines other than insulin to control your diabetes, ask your health care provider about whether you need to stop taking these medicines while you are sick. What else can  I do to manage my diabetes when I am sick? Check your ketones  If you have type 1 diabetes, check your urine ketones every 4 hours.  If you have type 2 diabetes, check your urine ketones as often as told by your health care provider. Drink fluids  Drink enough fluid to keep your urine clear or pale yellow. This is especially important if you have a fever, vomiting, or diarrhea. Those symptoms can lead to dehydration.  Follow any instructions from your health care provider about beverages to avoid. ? Do not drink alcohol, caffeine, or drinks that contain a lot of sugar. Take medicines as directed  Take-over-the-counter and prescription medicines only as told by your health care provider.  Check medicine labels for added sugars. Some medicines may contain sugar or types of sugars that can raise your blood glucose level. What foods can I eat when I am sick?  You need to eat some form of carbohydrates when you are sick. You should eat 45-50 grams (45-50 g) of carbohydrates every 3-4 hours until you feel better. All of the food choices below contain about 15 g of carbohydrates. Plan ahead and keep some of these foods around so you have them if you get sick.  4-6 oz (120-177 mL) carbonated beverage that contains sugar, such as regular (not diet) soda. You may be able to drink carbonated beverages more easily if you open the beverage and let it sit at room temperature for a few minutes before drinking.   of a twin frozen ice pop.  4 oz (120 g) regular gelatin.  4 oz (120 mL) fruit juice.  4 oz (120 g) ice cream or frozen yogurt.  2 oz (60 g) sherbet.  8 oz (240 mL) clear broth or soup.  4 oz (120 g) regular custard.  4 oz (120 g) regular pudding.  8 oz (240 g) plain  yogurt.  1 slice bread or toast.  6 saltine crackers.  5 vanilla wafers. Questions to ask your health care provider Consider asking the following questions so you know what to do on days when you are sick:  Should I adjust my diabetes medicines?  How often do I need to check my blood glucose?  What supplies do I need to manage my diabetes at home when I am sick?  What number can I call if I have questions?  What foods and drinks should I avoid? Contact a health care provider if:  You develop symptoms of diabetic ketoacidosis, such as: ? Fatigue. ? Weight loss. ? Excessive thirst. ? Light-headedness. ? Fruity or sweet-smelling breath. ? Excessive urination. ? Vision changes. ? Confusion or irritability. ? Nausea. ? Vomiting. ? Rapid breathing. ? Pain in the abdomen. ? Feeling flushed.  You are unable to drink fluids without vomiting.  You have any of the following for more than 6 hours: ? Nausea. ? Vomiting. ? Diarrhea.  Your blood glucose is at or above 240 mg/dL (13.3 mmol/L), even after you take an additional insulin dose.  You have a change in how you think, feel, or act (mental status).  You develop another serious illness.  You have been sick or have had a fever for 2 days or longer and you are not getting better. Get help right away if:  Your blood glucose is lower than 54 mg/dL (3.0 mmol/L).  You have difficulty breathing.  You have moderate or high ketone levels in your urine.  You used emergency glucagon to treat low blood   glucose. Summary  Blood sugar (glucose) can be difficult to control when you are sick. Common illnesses that can cause problems for people with diabetes (diabetes mellitus) include colds, fever, flu (influenza), nausea, vomiting, and diarrhea.  Illnesses can cause stress and loss of body fluids (dehydration), and those issues can cause blood glucose levels to increase.  Make a plan for days when you are sick (sick day plan)  as part of your diabetes management plan. You and your health care provider should make this plan in advance.  It is very important to take your insulin and diabetes medicines and to eat some form of carbohydrate when you are sick.  Contact your health care provider if have problems managing your blood glucose levels when you are sick, or if you have been sick or had a fever for 2 days or longer and are not getting better. This information is not intended to replace advice given to you by your health care provider. Make sure you discuss any questions you have with your health care provider. Document Released: 11/11/2003 Document Revised: 08/06/2016 Document Reviewed: 08/06/2016 Elsevier Patient Education  [place].  Cellulitis, Adult  Cellulitis is a skin infection. The infected area is often warm, red, swollen, and sore. It occurs most often in the arms and lower legs. It is very important to get treated for this condition. What are the causes? This condition is caused by bacteria. The bacteria enter through a break in the skin, such as a cut, burn, insect bite, open sore, or crack. What increases the risk? This condition is more likely to occur in people who:  Have a weak body defense system (immune system).  Have open cuts, burns, bites, or scrapes on the skin.  Are older than 49 years of age.  Have a blood sugar problem (diabetes).  Have a long-lasting (chronic) liver disease (cirrhosis) or kidney disease.  Are very overweight (obese).  Have a skin problem, such as: ? Itchy rash (eczema). ? Slow movement of blood in the veins (venous stasis). ? Fluid buildup below the skin (edema).  Have been treated with high-energy rays (radiation).  Use IV drugs. What are the signs or symptoms? Symptoms of this condition include:  Skin that is: ? Red. ? Streaking. ? Spotting. ? Swollen. ? Sore or painful when you touch it. ? Warm.  A fever.  Chills.  Blisters. How  is this diagnosed? This condition is diagnosed based on:  Medical history.  Physical exam.  Blood tests.  Imaging tests. How is this treated? Treatment for this condition may include:  Medicines to treat infections or allergies.  Home care, such as: ? Rest. ? Placing cold or warm cloths (compresses) on the skin.  Hospital care, if the condition is very bad. Follow these instructions at home: Medicines  Take over-the-counter and prescription medicines only as told by your doctor.  If you were prescribed an antibiotic medicine, take it as told by your doctor. Do not stop taking it even if you start to feel better. General instructions   Drink enough fluid to keep your pee (urine) pale yellow.  Do not touch or rub the infected area.  Raise (elevate) the infected area above the level of your heart while you are sitting or lying down.  Place cold or warm cloths on the area as told by your doctor.  Keep all follow-up visits as told by your doctor. This is important. Contact a doctor if:  You have a fever.  You do not start to get better after 1-2 days of treatment.  Your bone or joint under the infected area starts to hurt after the skin has healed.  Your infection comes back. This can happen in the same area or another area.  You have a swollen bump in the area.  You have new symptoms.  You feel ill and have muscle aches and pains. Get help right away if:  Your symptoms get worse.  You feel very sleepy.  You throw up (vomit) or have watery poop (diarrhea) for a long time.  You see red streaks coming from the area.  Your red area gets larger.  Your red area turns dark in color. These symptoms may represent a serious problem that is an emergency. Do not wait to see if the symptoms will go away. Get medical help right away. Call your local emergency services (911 in the [place] Do not drive yourself to the hospital. Summary  Cellulitis is a skin infection.  The area is often warm, red, swollen, and sore.  This condition is treated with medicines, rest, and cold and warm cloths.  Take all medicines only as told by your doctor.  Tell your doctor if symptoms do not start to get better after 1-2 days of treatment. This information is not intended to replace advice given to you by your health care provider. Make sure you discuss any questions you have with your health care provider. Document Released: 04/26/2008 Document Revised: 03/30/2018 Document Reviewed: 03/30/2018 Elsevier Patient Education  2020 [employer].

## 2019-07-18 NOTE — Progress Notes (Signed)
Subjective:     Gloria [name] is a 48 y.o. female and is here for a comprehensive physical exam. The patient reports problems - R ear irritation.  Patient endorses continued right ear drainage/irritation.  Also notes lymph node remains swollen on right side.  Patient denies fever, chills, nausea, vomiting, facial pain.  Patient followed by psychiatry for depression and anxiety.  Has been taking ergocalciferol 50,000 IU for low vitamin D.  Inquires about having vitamin D recheck.  Patient has a history of alcoholic liver disease.  Social History   Socioeconomic History  . Marital status: Divorced    Spouse name: Not on file  . Number of children: 1  . Years of education: 108  . Highest education level: Not on file  Occupational History  . Occupation: Child psychotherapist: [employer]  . Financial resource strain: Not on file  . Food insecurity    Worry: Not on file    Inability: Not on file  . Transportation needs    Medical: Not on file    Non-medical: Not on file  Tobacco Use  . Smoking status: Former Smoker    Packs/day: 1.00    Quit date: 01/26/2012    Years since quitting: 7.4  . Smokeless tobacco: Never Used  Substance and Sexual Activity  . Alcohol use: No    Alcohol/week: 0.0 standard drinks    Comment: History of alcohol abuse  . Drug use: No  . Sexual activity: Not Currently  Lifestyle  . Physical activity    Days per week: Not on file    Minutes per session: Not on file  . Stress: Not on file  Relationships  . Social Herbalist on phone: Not on file    Gets together: Not on file    Attends religious service: Not on file    Active member of club or organization: Not on file    Attends meetings of clubs or organizations: Not on file    Relationship status: Not on file  . Intimate partner violence    Fear of current or ex partner: Not on file    Emotionally abused: Not on file    Physically abused: Not on  file    Forced sexual activity: Not on file  Other Topics Concern  . Not on file  Social History Narrative   Patient lives at home and works full time.   Education some college.   Right handed.   Caffeine: 2 cups daily.   Health Maintenance  Topic Date Due  . PNEUMOCOCCAL POLYSACCHARIDE VACCINE AGE 39-64 HIGH RISK  01/21/1972  . OPHTHALMOLOGY EXAM  01/21/1980  . URINE MICROALBUMIN  01/21/1980  . HIV Screening  01/20/1985  . PAP SMEAR-Modifier  10/21/2018  . INFLUENZA VACCINE  06/23/2019  . HEMOGLOBIN A1C  10/12/2019  . FOOT EXAM  04/10/2020  . MAMMOGRAM  05/21/2020  . TETANUS/TDAP  10/27/2022    The following portions of the patient's history were reviewed and updated as appropriate: allergies, current medications, past family history, past medical history, past social history, past surgical history and problem list.  Review of Systems Pertinent items noted in HPI and remainder of comprehensive ROS otherwise negative.   Objective:    BP 110/76 (BP Location: Left Arm, Patient Position: Sitting, Cuff Size: Large)   Pulse 74   Temp 99.9 F (37.7 C) (Temporal)   Wt 240 lb (108.9 kg)   LMP 04/25/2013  SpO2 97%   BMI 39.94 kg/m  General appearance: alert, cooperative, appears stated age and no distress Head: Normocephalic, without obvious abnormality, atraumatic Eyes: conjunctivae/corneas clear. PERRL, EOM's intact. Fundi benign. Ears: normal TM's and external ear canals both ears R ear with erythema and drainage behind ear.  Healing fissure.    Nose: Nares normal. Septum midline. Mucosa normal. No drainage or sinus tenderness. Throat: lips, mucosa, and tongue normal; teeth and gums normal Neck: no adenopathy, no carotid bruit, no JVD, supple, symmetrical, trachea midline and thyroid not enlarged, symmetric, no tenderness/mass/nodules Lungs: clear to auscultation bilaterally Heart: regular rate and rhythm, S1, S2 normal, no murmur, click, rub or gallop Abdomen: soft,  non-tender; bowel sounds normal; no masses,  no organomegaly Extremities: extremities normal, atraumatic, no cyanosis or edema Pulses: 2+ and symmetric Skin: Skin color, texture, turgor normal. No rashes or lesions Lymph nodes: Cervical adenopathy: R sided superficial lymph node shotty, tender Neurologic: Alert and oriented X 3, normal strength and tone. Normal symmetric reflexes. Normal coordination and gait    Assessment:    Healthy female exam with cellulitis of R ear. Plan:     Anticipatory guidance given including wearing seatbelts, smoke detectors in the home, increasing physical activity, increasing p.o. intake of water and vegetables. -will obtain labs -mammogram up to date -given handout See After Visit Summary for Counseling Recommendations    Vitamin D deficiency -We will obtain vitamin D -Continue ergocalciferol  Type 2 diabetes without complication without long-term insulin use -Continue current labs including glipizide -Continue checking FS BS at home -Continue lifestyle modifications -Obtain hemoglobin A1c  Cellulitis, right ear -Keep clean and dry -Plan: Bactrim twice daily x7 days -avoid wearing face mask with thin ear loop that will dig into skin behind ear  Need for influenza vaccine -Influenza vaccine given this visit  Obesity -BMI 44 -Discussed lifestyle modifications  Follow-up in 3 months for DM, sooner if needed  Grier Mitts, MD

## 2019-08-14 ENCOUNTER — Other Ambulatory Visit: Payer: Self-pay | Admitting: Family Medicine

## 2019-08-14 MED ORDER — GLIPIZIDE 5 MG PO TABS: 5.0000 mg | ORAL_TABLET | Freq: Every day | ORAL | 3 refills | Status: DC

## 2019-08-14 NOTE — Telephone Encounter (Signed)
Pt request refill  glipiZIDE (GLUCOTROL) 5 MG tablet  90 day  Pt states she called the pharmacy last week, but I do not see anything from them. Pt is out and needs asap.   [facility] [place], [place] [phone] (Phone) [phone] (Fax)

## 2019-08-20 ENCOUNTER — Other Ambulatory Visit: Payer: Self-pay | Admitting: Neurology

## 2019-08-24 ENCOUNTER — Other Ambulatory Visit: Payer: Self-pay

## 2019-08-24 DIAGNOSIS — M5136 Other intervertebral disc degeneration, lumbar region: Secondary | ICD-10-CM

## 2019-08-24 MED ORDER — IBUPROFEN 800 MG PO TABS: ORAL_TABLET | ORAL | 3 refills | Status: DC

## 2019-09-03 ENCOUNTER — Other Ambulatory Visit: Payer: Self-pay

## 2019-09-03 DIAGNOSIS — Z20822 Contact with and (suspected) exposure to covid-19: Secondary | ICD-10-CM

## 2019-09-04 LAB — NOVEL CORONAVIRUS, NAA: SARS-CoV-2, NAA: NOT DETECTED

## 2019-09-26 ENCOUNTER — Telehealth: Payer: Self-pay | Admitting: Family Medicine

## 2019-09-26 NOTE — Telephone Encounter (Signed)
Copied from [place] [id number]. Topic: General - Other >> Sep 26, 2019  3:43 PM Keene Breath wrote: Reason for CRM: Patient called to ask the doctor about a referral to a dermatologist that the doctor had referred she thinks back in May.  She stated that she has not heard anything yet.  CB# [phone]

## 2019-10-08 ENCOUNTER — Other Ambulatory Visit: Payer: Self-pay | Admitting: Family Medicine

## 2019-10-12 ENCOUNTER — Other Ambulatory Visit: Payer: Self-pay

## 2019-10-12 DIAGNOSIS — Z20822 Contact with and (suspected) exposure to covid-19: Secondary | ICD-10-CM

## 2019-10-15 LAB — NOVEL CORONAVIRUS, NAA: SARS-CoV-2, NAA: NOT DETECTED

## 2019-10-16 ENCOUNTER — Telehealth: Payer: Self-pay

## 2019-10-16 DIAGNOSIS — D229 Melanocytic nevi, unspecified: Secondary | ICD-10-CM

## 2019-10-16 NOTE — Telephone Encounter (Signed)
Referral to Dermatology  placed, left a detailed message for pt to wait for a call from their office for scheduling.

## 2019-10-16 NOTE — Telephone Encounter (Signed)
Referral has been placed and a detailed voice mail was left on pt phone

## 2019-10-31 ENCOUNTER — Other Ambulatory Visit: Payer: Self-pay

## 2019-11-01 ENCOUNTER — Encounter: Payer: Self-pay | Admitting: Obstetrics & Gynecology

## 2019-11-01 ENCOUNTER — Ambulatory Visit (INDEPENDENT_AMBULATORY_CARE_PROVIDER_SITE_OTHER): Payer: No Typology Code available for payment source | Admitting: Obstetrics & Gynecology

## 2019-11-01 VITALS — BP 140/88 | Ht 64.0 in | Wt 238.0 lb

## 2019-11-01 DIAGNOSIS — Z79890 Hormone replacement therapy (postmenopausal): Secondary | ICD-10-CM

## 2019-11-01 DIAGNOSIS — E66813 Obesity, class 3: Secondary | ICD-10-CM

## 2019-11-01 DIAGNOSIS — B373 Candidiasis of vulva and vagina: Secondary | ICD-10-CM | POA: Diagnosis not present

## 2019-11-01 DIAGNOSIS — Z01419 Encounter for gynecological examination (general) (routine) without abnormal findings: Secondary | ICD-10-CM | POA: Diagnosis not present

## 2019-11-01 DIAGNOSIS — Z90710 Acquired absence of both cervix and uterus: Secondary | ICD-10-CM

## 2019-11-01 DIAGNOSIS — B3731 Acute candidiasis of vulva and vagina: Secondary | ICD-10-CM

## 2019-11-01 DIAGNOSIS — Z6841 Body Mass Index (BMI) 40.0 and over, adult: Secondary | ICD-10-CM

## 2019-11-01 MED ORDER — ESTRADIOL 0.05 MG/24HR TD PTWK: MEDICATED_PATCH | TRANSDERMAL | 4 refills | Status: DC

## 2019-11-01 MED ORDER — FLUCONAZOLE 150 MG PO TABS: 150.0000 mg | ORAL_TABLET | Freq: Every day | ORAL | 3 refills | Status: AC

## 2019-11-01 NOTE — Progress Notes (Signed)
Gloria [name] Michael May 27, 1971 [id number]   History:    48 y.o. G1P1L1  Divorced  RP:  Established patient presenting for annual gyn exam   HPI: Menopause, well on Estradiol patch 0.05 weekly x 2 years.  S/P Total Hysterectomy.  No pelvic pain.  Abstinent.  Frequent yeast vaginitis.  Breasts wnl.  BMI improved at 40.85.  Has decreased her Calories/carbs since Dx of DM type 2 in 03/2019.  Not regularly physically active.  Health Labs with Fam MD.  Past medical history,surgical history, family history and social history were all reviewed and documented in the EPIC chart.  Gynecologic History Patient's last menstrual period was 04/25/2013.  Obstetric History OB History  Gravida Para Term Preterm AB Living  1 1 1     1   SAB TAB Ectopic Multiple Live Births          1    # Outcome Date GA Lbr Len/2nd Weight Sex Delivery Anes PTL Lv  1 Term     M Vag-Spont  N LIV     ROS: A ROS was performed and pertinent positives and negatives are included in the history.  GENERAL: No fevers or chills. HEENT: No change in vision, no earache, sore throat or sinus congestion. NECK: No pain or stiffness. CARDIOVASCULAR: No chest pain or pressure. No palpitations. PULMONARY: No shortness of breath, cough or wheeze. GASTROINTESTINAL: No abdominal pain, nausea, vomiting or diarrhea, melena or bright red blood per rectum. GENITOURINARY: No urinary frequency, urgency, hesitancy or dysuria. MUSCULOSKELETAL: No joint or muscle pain, no back pain, no recent trauma. DERMATOLOGIC: No rash, no itching, no lesions. ENDOCRINE: No polyuria, polydipsia, no heat or cold intolerance. No recent change in weight. HEMATOLOGICAL: No anemia or easy bruising or bleeding. NEUROLOGIC: No headache, seizures, numbness, tingling or weakness. PSYCHIATRIC: No depression, no loss of interest in normal activity or change in sleep pattern.     Exam:   BP 140/88   Ht 5' 4"  (1.626 m)   Wt 238 lb (108 kg)   LMP 04/25/2013   BMI  40.85 kg/m   Body mass index is 40.85 kg/m.  General appearance : Well developed well nourished female. No acute distress HEENT: Eyes: no retinal hemorrhage or exudates,  Neck supple, trachea midline, no carotid bruits, no thyroidmegaly Lungs: Clear to auscultation, no rhonchi or wheezes, or rib retractions  Heart: Regular rate and rhythm, no murmurs or gallops Breast:Examined in sitting and supine position were symmetrical in appearance, no palpable masses or tenderness,  no skin retraction, no nipple inversion, no nipple discharge, no skin discoloration, no axillary or supraclavicular lymphadenopathy Abdomen: no palpable masses or tenderness, no rebound or guarding Extremities: no edema or skin discoloration or tenderness  Pelvic: Vulva: Normal             Vagina: No gross lesions.  Increased yeasty discharge  Cervix/Uterus absent  Adnexa  Without masses or tenderness  Anus: Normal   Assessment/Plan:  48 y.o. female for annual exam   1. Well woman exam with routine gynecological exam Gynecologic exam status post total hysterectomy.  Pap test November 2018 was negative, no indication to repeat this year.  Breast exam normal.  Screening mammogram June 2020 was negative.  Health labs with family physician.  Will schedule a screening colonoscopy at age 1 next year.  2. H/O total hysterectomy  3. Postmenopausal hormone replacement therapy Well on estrogen replacement therapy with Climara 0.05 weekly.  No point contraindication to continue on hormone replacement  therapy.  Prescription sent to pharmacy.  Vitamin D supplements, calcium intake of 1200 mg daily and regular weightbearing physical activity is recommended.  4. Yeast vaginitis Yeast vaginitis with typical vaginal discharge.  Will treat with fluconazole 150 mg daily for 3 days.  Prescription sent to pharmacy.  5. Class 3 severe obesity due to excess calories with serious comorbidity and body mass index (BMI) of 40.0 to 44.9 in  adult [facility]) Improved body mass index with lower calorie/carb diet which patient will continue on.  Aerobic physical activities 5 times a week and light weightlifting every 2 days recommended.  Other orders - fluconazole (DIFLUCAN) 150 MG tablet; Take 1 tablet (150 mg total) by mouth daily for 3 days. - estradiol (CLIMARA - DOSED IN MG/24 HR) 0.05 mg/24hr patch; APPLY 1 PATCH TOPICALLY TO  SKIN ONCE A WEEK  Princess Bruins MD, 3:56 PM 11/01/2019

## 2019-11-04 ENCOUNTER — Encounter: Payer: Self-pay | Admitting: Obstetrics & Gynecology

## 2019-11-04 NOTE — Patient Instructions (Signed)
1. Well woman exam with routine gynecological exam Gynecologic exam status post total hysterectomy.  Pap test November 2018 was negative, no indication to repeat this year.  Breast exam normal.  Screening mammogram June 2020 was negative.  Health labs with family physician.  Will schedule a screening colonoscopy at age 48 next year.  2. H/O total hysterectomy  3. Postmenopausal hormone replacement therapy Well on estrogen replacement therapy with Climara 0.05 weekly.  No point contraindication to continue on hormone replacement therapy.  Prescription sent to pharmacy.  Vitamin D supplements, calcium intake of 1200 mg daily and regular weightbearing physical activity is recommended.  4. Yeast vaginitis Yeast vaginitis with typical vaginal discharge.  Will treat with fluconazole 150 mg daily for 3 days.  Prescription sent to pharmacy.  5. Class 3 severe obesity due to excess calories with serious comorbidity and body mass index (BMI) of 40.0 to 44.9 in adult [facility]) Improved body mass index with lower calorie/carb diet which patient will continue on.  Aerobic physical activities 5 times a week and light weightlifting every 2 days recommended.  Other orders - fluconazole (DIFLUCAN) 150 MG tablet; Take 1 tablet (150 mg total) by mouth daily for 3 days. - estradiol (CLIMARA - DOSED IN MG/24 HR) 0.05 mg/24hr patch; APPLY 1 PATCH TOPICALLY TO  SKIN ONCE A WEEK  Yarisa, it was a pleasure seeing you today!

## 2019-11-13 ENCOUNTER — Other Ambulatory Visit: Payer: Self-pay

## 2019-11-13 DIAGNOSIS — Z8669 Personal history of other diseases of the nervous system and sense organs: Secondary | ICD-10-CM

## 2019-11-13 MED ORDER — RIZATRIPTAN BENZOATE 10 MG PO TABS: 10.0000 mg | ORAL_TABLET | ORAL | 5 refills | Status: DC | PRN

## 2019-11-21 ENCOUNTER — Other Ambulatory Visit: Payer: Self-pay | Admitting: Family Medicine

## 2019-12-08 ENCOUNTER — Other Ambulatory Visit: Payer: Self-pay | Admitting: Family Medicine

## 2020-01-11 ENCOUNTER — Other Ambulatory Visit: Payer: Self-pay

## 2020-01-11 ENCOUNTER — Encounter: Payer: Self-pay | Admitting: Family Medicine

## 2020-01-11 MED ORDER — CONTOUR NEXT TEST VI STRP: ORAL_STRIP | 5 refills | Status: AC

## 2020-01-11 NOTE — Telephone Encounter (Signed)
Spoke with aware that Rx for test strips was sent to [facility]

## 2020-02-14 ENCOUNTER — Encounter: Payer: Self-pay | Admitting: Internal Medicine

## 2020-02-21 ENCOUNTER — Ambulatory Visit: Payer: No Typology Code available for payment source | Admitting: Family Medicine

## 2020-02-27 ENCOUNTER — Other Ambulatory Visit: Payer: Self-pay

## 2020-02-28 ENCOUNTER — Ambulatory Visit: Payer: No Typology Code available for payment source | Admitting: Family Medicine

## 2020-03-12 ENCOUNTER — Other Ambulatory Visit: Payer: Self-pay

## 2020-03-13 ENCOUNTER — Encounter: Payer: Self-pay | Admitting: Family Medicine

## 2020-03-13 ENCOUNTER — Ambulatory Visit: Payer: No Typology Code available for payment source | Admitting: Psychiatry

## 2020-03-13 ENCOUNTER — Ambulatory Visit (INDEPENDENT_AMBULATORY_CARE_PROVIDER_SITE_OTHER): Payer: No Typology Code available for payment source | Admitting: Family Medicine

## 2020-03-13 ENCOUNTER — Ambulatory Visit (INDEPENDENT_AMBULATORY_CARE_PROVIDER_SITE_OTHER): Payer: No Typology Code available for payment source

## 2020-03-13 VITALS — BP 140/86 | HR 66 | Temp 97.7°F | Wt 258.0 lb

## 2020-03-13 DIAGNOSIS — E119 Type 2 diabetes mellitus without complications: Secondary | ICD-10-CM | POA: Diagnosis not present

## 2020-03-13 DIAGNOSIS — F4321 Adjustment disorder with depressed mood: Secondary | ICD-10-CM | POA: Diagnosis not present

## 2020-03-13 DIAGNOSIS — R2231 Localized swelling, mass and lump, right upper limb: Secondary | ICD-10-CM | POA: Diagnosis not present

## 2020-03-13 DIAGNOSIS — R635 Abnormal weight gain: Secondary | ICD-10-CM | POA: Diagnosis not present

## 2020-03-13 LAB — MICROALBUMIN / CREATININE URINE RATIO
Creatinine,U: 10.7 mg/dL
Microalb Creat Ratio: 6.5 mg/g (ref 0.0–30.0)
Microalb, Ur: <0.7 mg/dL (ref 0.0–1.9)

## 2020-03-13 LAB — POCT GLYCOSYLATED HEMOGLOBIN (HGB A1C): Hemoglobin A1C: 5.3 % (ref 4.0–5.6)

## 2020-03-13 NOTE — Progress Notes (Signed)
Subjective:    Patient ID: Gloria [name], female    DOB: 05-Jun-1971, 49 y.o.   MRN: [id number]  No chief complaint on file.   HPI Patient was seen today for f/u.  Pt lost her father in February. She endorse taking care of her parents as her siblings are out of state.  Pt notes increased hunger despite having just ate.  Pt knows she has gained weight and her fsbs have been elevated.  Pt notes having to get a new glucometer due to insurance preference.  Pt had a One Touch variable which worked well she was able to parent with various apps.  She now has a Contour next meter which is not app friendly.  Pt no longer able to find out blood sugar reports which she found helpful.  Pt endorse an enlargement of R 5th digit.  May have been present x a few months.  Area is not painful or red.  Pt denies difficulty bending finger.  Past Medical History:  Diagnosis Date  . Allergy   . Anemia   . Anxiety   . Arthritis   . Bulging disc    X 2  . Chronic active hepatitis ([place])   . Chronic back pain 02/25/2015  . Chronic headaches   . Depression   . Diverticulosis   . Elevated LFTs   . Fibromyalgia   . GERD (gastroesophageal reflux disease)   . History of ETOH abuse   . Hyperlipidemia   . Obese   . Obstructive sleep apnea 02/25/2015  . Osteoarthritis    lower back  . Periodic limb movement disorder 02/25/2014  . Pre-diabetes    pt taking metformin  . RLS (restless legs syndrome)   . Smoker   . Substance abuse ([facility])     Allergies  Allergen Reactions  . Benadryl [Diphenhydramine Hcl] Hives  . Metformin And Related     Blurred vision  . Nuvigil [Armodafinil]     migraines   . Requip [Ropinirole Hcl]     Increased headache    ROS General: Denies fever, chills, night sweats, changes in weight, changes in appetite  +grief, increased hunger HEENT: Denies headaches, ear pain, changes in vision, rhinorrhea, sore throat CV: Denies CP, palpitations, SOB, orthopnea Pulm: Denies SOB, cough,  wheezing GI: Denies abdominal pain, nausea, vomiting, diarrhea, constipation GU: Denies dysuria, hematuria, frequency, vaginal discharge Msk: Denies muscle cramps, joint pains Neuro: Denies weakness, numbness, tingling Skin: Denies rashes, bruising  +finger edema Psych: Denies depression, anxiety, hallucinations    Objective:    Blood pressure 140/86, pulse 66, temperature 97.7 F (36.5 C), temperature source Temporal, weight 258 lb (117 kg), last menstrual period 04/25/2013, SpO2 98 %.   Gen. Pleasant, well-nourished, in no distress, tearful at times, normal affect  HEENT: [place]/AT, face symmetric, no scleral icterus, PERRLA, EOMI, nares patent without drainage Lungs: no accessory muscle use, CTAB, no wheezes or rales Cardiovascular: RRR, no peripheral edema Musculoskeletal: Nodule proximal to right fifth digit DIP joint.  No erythema, increased warmth,.  Normal ROM of digits.  No TTP of bilateral hands.  No deformities, no cyanosis or clubbing, normal tone Neuro:  A&Ox3, CN II-XII intact, normal gait Skin:  Warm, no lesions/ rash.  Nodule proximal to R 5 th digit DIP joint.   Wt Readings from Last 3 Encounters:  03/13/20 258 lb (117 kg)  11/01/19 238 lb (108 kg)  07/18/19 240 lb (108.9 kg)    Lab Results  Component Value Date  WBC 11.1 (H) 07/18/2019   HGB 13.5 07/18/2019   HCT 41.3 07/18/2019   PLT 331.0 07/18/2019   GLUCOSE 55 (L) 07/18/2019   CHOL 244 (H) 07/18/2019   TRIG 311.0 (H) 07/18/2019   HDL 38.60 (L) 07/18/2019   LDLDIRECT 145.0 07/18/2019   LDLCALC 94 09/03/2016   ALT 19 07/18/2019   AST 14 07/18/2019   NA 138 07/18/2019   K 4.0 07/18/2019   CL 101 07/18/2019   CREATININE 0.57 07/18/2019   BUN 11 07/18/2019   CO2 26 07/18/2019   TSH 2.400 10/06/2017   INR 0.99 12/30/2016   HGBA1C 6.1 07/18/2019    Assessment/Plan:  Type 2 diabetes mellitus without complication, without long-term current use of insulin ([place]) -Discussed ways to get back on track  with eating -Continue glipizide 5 mg daily. -Discussed meter/test strips issues with pharmacist.  Will look into help with texture coverage versus apps that work with current meter. - Plan: Microalbumin/Creatinine Ratio, Urine, POC HgB A1c  Nodule of finger of right hand  -Discussed various causes including OA versus RA - Plan: DG Hand Complete Right  Grief -Discussed ways to reduce stress off-patient encouraged to consider counseling.  Has an appointment in a few weeks. -Given handout -We will continue to monitor  Weight gain -Likely 2/2 grieving. -Discussed getting back on track with portion sizes and increasing physical activity -Discussed setting goals. -We will continue to monitor.  F/u as needed in the next few months, sooner if needed  Grier Mitts, MD

## 2020-03-13 NOTE — Patient Instructions (Addendum)
Type 2 Diabetes Mellitus, Self Care, Adult  Caring for yourself after you have been diagnosed with type 2 diabetes (type 2 diabetes mellitus) means keeping your blood sugar (glucose) under control with a balance of:   Nutrition.   Exercise.   Lifestyle changes.   Medicines or insulin, if necessary.   Support from your team of health care providers and others.  The following information explains what you need to know to manage your diabetes at home.  What are the risks?  Having diabetes can put you at risk for other long-term (chronic) conditions, such as heart disease and kidney disease. Your health care provider may prescribe medicines to help prevent complications from diabetes. These medicines may include:   Aspirin.   Medicine to lower cholesterol.   Medicine to control blood pressure.  How to monitor blood glucose     Check your blood glucose every day, as often as told by your health care provider.   Have your A1c (hemoglobin A1c) level checked two or more times a year, or as often as told by your health care provider.  Your health care provider will set individualized treatment goals for you. Generally, the goal of treatment is to maintain the following blood glucose levels:   Before meals (preprandial): 80-130 mg/dL (4.4-7.2 mmol/L).   After meals (postprandial): below 180 mg/dL (10 mmol/L).   A1c level: less than 7%.  How to manage hyperglycemia and hypoglycemia  Hyperglycemia symptoms  Hyperglycemia, also called high blood glucose, occurs when blood glucose is too high. Make sure you know the early signs of hyperglycemia, such as:   Increased thirst.   Hunger.   Feeling very tired.   Needing to urinate more often than usual.   Blurry vision.  Hypoglycemia symptoms  Hypoglycemia, also called low blood glucose, occurswith a blood glucose level at or below 70 mg/dL (3.9 mmol/L). The risk for hypoglycemia increases during or after exercise, during sleep, during illness, and when skipping  meals or not eating for a long time (fasting).  It is important to know the symptoms of hypoglycemia and treat it right away. Always have a 15-gram rapid-acting carbohydrate snack with you to treat low blood glucose. Family members and close friends should also know the symptoms and should understand how to treat hypoglycemia, in case you are not able to treat yourself. Symptoms may include:   Hunger.   Anxiety.   Sweating and feeling clammy.   Confusion.   Dizziness or feeling light-headed.   Sleepiness.   Nausea.   Increased heart rate.   Headache.   Blurry vision.   Irritability.   A change in coordination.   Tingling or numbness around the mouth, lips, or tongue.   Restless sleep.   Fainting.   Seizure.  Treating hypoglycemia  If you are alert and able to swallow safely, follow the 15:15 rule:   Take 15 grams of a rapid-acting carbohydrate. Talk with your health care provider about how much you should take.   Rapid-acting options include:  ? Glucose pills (take 15 grams).  ? 6-8 pieces of hard candy.  ? 4-6 oz (120-150 mL) of fruit juice.  ? 4-6 oz (120-150 mL) of regular (not diet) soda.  ? 1 Tbsp (15 mL) honey or sugar.   Check your blood glucose 15 minutes after you take the carbohydrate.   If the repeat blood glucose level is still at or below 70 mg/dL (3.9 mmol/L), take 15 grams of a carbohydrate again.     is an emergency. Do not wait to see if the symptoms will go away. Get medical help right away. Call your local emergency services (911 in the [place] If you have severe hypoglycemia and you cannot eat or drink, you may need an injection of  glucagon. A family member or close friend should learn how to check your blood glucose and how to give you a glucagon injection. Ask your health care provider if you need to have an emergency glucagon injection kit available. Severe hypoglycemia may need to be treated in a hospital. The treatment may include getting glucose through an IV. You may also need treatment for the cause of your hypoglycemia. Follow these instructions at home: Take diabetes medicines as told  If your health care provider prescribed insulin or diabetes medicines, take them every day.  Do not run out of insulin or other diabetes medicines that you take. Plan ahead so you always have these available.  If you use insulin, adjust your dosage based on how physically active you are and what foods you eat. Your health care provider will tell you how to adjust your dosage. Make healthy food choices  The things that you eat and drink affect your blood glucose and your insulin dosage. Making good choices helps to control your diabetes and prevent other health problems. A healthy meal plan includes eating lean proteins, complex carbohydrates, fresh fruits and vegetables, low-fat dairy products, and healthy fats. Make an appointment to see a diet and nutrition specialist (registered dietitian) to help you create an eating plan that is right for you. Make sure that you:  Follow instructions from your health care provider about eating or drinking restrictions.  Drink enough fluid to keep your urine pale yellow.  Keep a record of the carbohydrates that you eat. Do this by reading food labels and learning the standard serving sizes of foods.  Follow your sick day plan whenever you cannot eat or drink as usual. Make this plan in advance with your health care provider.  Stay active Exercise regularly, as told by your health care provider. This may include:  Stretching and doing strength exercises, such as yoga or weightlifting, 2 or  more times a week.  Doing 150 minutes or more of moderate-intensity or vigorous-intensity exercise each week. This could be brisk walking, biking, or water aerobics. ? Spread out your activity over 3 or more days of the week. ? Do not go more than 2 days in a row without doing some kind of physical activity. When you start a new exercise or activity, work with your health care provider to adjust your insulin, medicines, or food intake as needed. Make healthy lifestyle choices  Do not use any tobacco products, such as cigarettes, chewing tobacco, and e-cigarettes. If you need help quitting, ask your health care provider.  If your health care provider says that alcohol is safe for you, limit alcohol intake to no more than 1 drink per day for nonpregnant women and 2 drinks per day for men. One drink equals 12 oz of beer (355 mL), 5 oz of wine (148 mL), or 1 oz of hard liquor (44 mL).  Learn to manage stress. If you need help with this, ask your health care provider. Care for your body   Keep your immunizations up to date. In addition to getting vaccinations as told by your health care provider, it is recommended that you get vaccinated against the following illnesses: ? The flu (influenza). Get a flu shot  every year. ? Pneumonia. ? Hepatitis B.  Schedule an eye exam soon after your diagnosis, and then one time every year after that.  Check your skin and feet every day for cuts, bruises, redness, blisters, or sores. Schedule a foot exam with your health care provider once every year.  Brush your teeth and gums two times a day, and floss one or more times a day. Visit your dentist one or more times every 6 months.  Maintain a healthy weight. General instructions  Take over-the-counter and prescription medicines only as told by your health care provider.  Share your diabetes management plan with people in your workplace, school, and household.  Carry a medical alert card or wear medical  alert jewelry.  Keep all follow-up visits as told by your health care provider. This is important. Questions to ask your health care provider  Do I need to meet with a diabetes educator?  Where can I find a support group for people with diabetes? Where to find more information For more information about diabetes, visit:  American Diabetes Association (ADA): www.diabetes.org  American Association of Diabetes Educators (AADE): www.diabeteseducator.org Summary  Caring for yourself after you have been diagnosed with (type 2 diabetes mellitus) means keeping your blood sugar (glucose) under control with a balance of nutrition, exercise, lifestyle changes, and medicine.  Check your blood glucose every day, as often as told by your health care provider.  Having diabetes can put you at risk for other long-term (chronic) conditions, such as heart disease and kidney disease. Your health care provider may prescribe medicines to help prevent complications from diabetes.  Keep all follow-up visits as told by your health care provider. This is important. This information is not intended to replace advice given to you by your health care provider. Make sure you discuss any questions you have with your health care provider. Document Revised: 05/01/2018 Document Reviewed: 12/12/2015 Elsevier Patient Education  [place] Loss, Adult People experience loss in many different ways throughout their lives. Events such as moving, changing jobs, and losing friends can create a sense of loss. The loss may be as serious as a major health change, divorce, death of a pet, or death of a loved one. All of these types of loss are likely to create a physical and emotional reaction known as grief. Grief is the result of a major change or an absence of something or someone that you count on. Grief is a normal reaction to loss. A variety of factors can affect your grieving experience, including:  The  nature of your loss.  Your relationship to what or whom you lost.  Your understanding of grief and how to manage it.  Your support system. How to manage lifestyle changes Keep to your normal routine as much as possible.  If you have trouble focusing or doing normal activities, it is acceptable to take some time away from your normal routine.  Spend time with friends and loved ones.  Eat a healthy diet, get plenty of sleep, and rest when you feel tired. How to recognize changes  The way that you deal with your grief will affect your ability to function as you normally do. When grieving, you may experience these changes:  Numbness, shock, sadness, anxiety, anger, denial, and guilt.  Thoughts about death.  Unexpected crying.  A physical sensation of emptiness in your stomach.  Problems sleeping and eating.  Tiredness (fatigue).  Loss of interest in normal activities.  Dreaming  about or imagining seeing the person who died.  A need to remember what or whom you lost.  Difficulty thinking about anything other than your loss for a period of time.  Relief. If you have been expecting the loss for a while, you may feel a sense of relief when it happens. Follow these instructions at home:  Activity Express your feelings in healthy ways, such as:  Talking with others about your loss. It may be helpful to find others who have had a similar loss, such as a support group.  Writing down your feelings in a journal.  Doing physical activities to release stress and emotional energy.  Doing creative activities like painting, sculpting, or playing or listening to music.  Practicing resilience. This is the ability to recover and adjust after facing challenges. Reading some resources that encourage resilience may help you to learn ways to practice those behaviors. General instructions  Be patient with yourself and others. Allow the grieving process to happen, and remember that  grieving takes time. ? It is likely that you may never feel completely done with some grief. You may find a way to move on while still cherishing memories and feelings about your loss. ? Accepting your loss is a process. It can take months or longer to adjust.  Keep all follow-up visits as told by your health care provider. This is important. Where to find support To get support for managing loss:  Ask your health care provider for help and recommendations, such as grief counseling or therapy.  Think about joining a support group for people who are managing a loss. Where to find more information You can find more information about managing loss from:  American Society of Clinical Oncology: www.cancer.net  American Psychological Association: TVStereos.ch Contact a health care provider if:  Your grief is extreme and keeps getting worse.  You have ongoing grief that does not improve.  Your body shows symptoms of grief, such as illness.  You feel depressed, anxious, or lonely. Get help right away if:  You have thoughts about hurting yourself or others. If you ever feel like you may hurt yourself or others, or have thoughts about taking your own life, get help right away. You can go to your nearest emergency department or call:  Your local emergency services (911 in the [place]  A suicide crisis helpline, such as the [facility] at 507-254-7680. This is open 24 hours a day. Summary  Grief is the result of a major change or an absence of someone or something that you count on. Grief is a normal reaction to loss.  The depth of grief and the period of recovery depend on the type of loss and your ability to adjust to the change and process your feelings.  Processing grief requires patience and a willingness to accept your feelings and talk about your loss with people who are supportive.  It is important to find resources that work for you and to realize that  people experience grief differently. There is not one grieving process that works for everyone in the same way.  Be aware that when grief becomes extreme, it can lead to more severe issues like isolation, depression, anxiety, or suicidal thoughts. Talk with your health care provider if you have any of these issues. This information is not intended to replace advice given to you by your health care provider. Make sure you discuss any questions you have with your health care provider. Document Revised:  01/12/2019 Document Reviewed: 03/24/2017 Elsevier Patient Education  [place].

## 2020-03-15 ENCOUNTER — Other Ambulatory Visit: Payer: Self-pay | Admitting: Family Medicine

## 2020-03-16 ENCOUNTER — Encounter: Payer: Self-pay | Admitting: Family Medicine

## 2020-03-20 ENCOUNTER — Encounter: Payer: Self-pay | Admitting: Psychiatry

## 2020-03-20 ENCOUNTER — Other Ambulatory Visit: Payer: Self-pay

## 2020-03-20 ENCOUNTER — Ambulatory Visit (INDEPENDENT_AMBULATORY_CARE_PROVIDER_SITE_OTHER): Payer: No Typology Code available for payment source | Admitting: Psychiatry

## 2020-03-20 DIAGNOSIS — F4323 Adjustment disorder with mixed anxiety and depressed mood: Secondary | ICD-10-CM

## 2020-03-20 NOTE — Progress Notes (Signed)
[facility] Counselor/Therapist Progress Note  Patient ID: Gloria [name], MRN: [id number],    Date: 03/21/2020  Time Spent: 50 minutes   Treatment Type: Individual Therapy  Reported Symptoms: sad, grief  Mental Status Exam:  Appearance:   Casual     Behavior:  Appropriate  Motor:  Normal  Speech/Language:   Clear and Coherent  Affect:  Appropriate  Mood:  sad  Thought process:  normal  Thought content:    WNL  Sensory/Perceptual disturbances:    WNL  Orientation:  oriented to person, place, time/date and situation  Attention:  Good  Concentration:  Good  Memory:  WNL  Fund of knowledge:   Good  Insight:    Good  Judgment:   Good  Impulse Control:  Good   Risk Assessment: Danger to Self:  No Self-injurious Behavior: No Danger to Others: No Duty to Warn:no Physical Aggression / Violence:No  Access to Firearms a concern: No  Gang Involvement:No   Subjective: The client states that things have been difficult with the pandemic.  Her father died 01-22-2020.  "The last 2 years his health declined.  It was hard."  The client stated that hospice came in on 2022/12/25.  He died from metastasized liver cancer.  Today the client wanted to focus on the sadness that she carries.  "It is really hard.  There is a lot of fear and anger connected to this."  She saw her mom working herself to death trying to take care of her dad.  I used eye-movement with the client focusing on the sadness, fear and anger in her chest.  Her negative cognition is, "I am overwhelmed."  Her subjective units of distress is a 7.  As the client processed she became exceptionally tearful.  "Is it okay to be okay with my dad's passing?  As the client processed more she answered her question, "yes".  She has been helping her mother get her health intact.  She is angry that her parents were hoarders.  Their house in [place] needs to be cleaned out.  Her mother seems to be unable to do it and does  not want the family to help her.  I explained to the client that many times real estate agents have people that can help clear out a house.  She believes her mother wants to take a year to figure things out.  This will require a large amount of radical acceptance on the client's part.  As we continued to process using eye-movement, I asked the client what she was doing to take care of herself?  She is applying for a new job.  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  It will require her to be in person versus working remotely.  This will be positive for the client since over the last year she has been working from home which has exacerbated her weight gain and lack of physical fitness.  She agrees that she needs to start walking using mood independent behavior.  I also encouraged the client to meet with friends now that people are getting vaccinated.  She will need to be mindful about doing this.  She agrees.  At the end of the session the client's subjective units of distress was less than 3.  Her positive cognition was, "I can do this."  Interventions: Assertiveness/Communication, Mindfulness Meditation, Motivational Interviewing, Solution-Oriented/Positive Psychology, [facility] Desensitization and Reprocessing (EMDR) and Insight-Oriented  Diagnosis:  ICD-10-CM   1. Adjustment disorder with mixed anxiety and depressed mood  F43.23     Plan: Mindfulness, mood independent behavior, radical acceptance, exercise, self-care, positive self talk, assertiveness, boundaries.  Quilla Freeze, [facility]

## 2020-03-27 ENCOUNTER — Ambulatory Visit: Payer: No Typology Code available for payment source | Admitting: Psychiatry

## 2020-04-03 ENCOUNTER — Ambulatory Visit (AMBULATORY_SURGERY_CENTER): Payer: Self-pay | Admitting: *Deleted

## 2020-04-03 ENCOUNTER — Other Ambulatory Visit: Payer: Self-pay

## 2020-04-03 VITALS — Temp 97.8°F | Ht 65.0 in | Wt 260.2 lb

## 2020-04-03 DIAGNOSIS — Z1211 Encounter for screening for malignant neoplasm of colon: Secondary | ICD-10-CM

## 2020-04-03 MED ORDER — SUTAB 1479-225-188 MG PO TABS: 1.0000 | ORAL_TABLET | Freq: Once | ORAL | 0 refills | Status: AC

## 2020-04-03 NOTE — Progress Notes (Signed)
Pt received 2nd covid vaccine 02-22-20  Pt is aware that care partner will wait in the car during procedure; if they feel like they will be too hot or cold to wait in the car; they may wait in the 4 th floor lobby. Patient is aware to bring only one care partner. We want them to wear a mask (we do not have any that we can provide them), practice social distancing, and we will check their temperatures when they get here.  I did remind the patient that their care partner needs to stay in the parking lot the entire time and have a cell phone available, we will call them when the pt is ready for discharge. Patient will wear mask into building.   No trouble with anesthesia, difficulty with intubation or hx/fam hx of malignant hyperthermia per pt   No egg or soy allergy  No home oxygen use   No medications for weight loss taken  emmi information given  Pt denies constipation issues   Sutab code put into RX and paper copy given to pt to show pharmacy

## 2020-04-09 ENCOUNTER — Other Ambulatory Visit: Payer: Self-pay | Admitting: Family Medicine

## 2020-04-10 ENCOUNTER — Ambulatory Visit (INDEPENDENT_AMBULATORY_CARE_PROVIDER_SITE_OTHER): Payer: No Typology Code available for payment source | Admitting: Psychiatry

## 2020-04-10 ENCOUNTER — Other Ambulatory Visit: Payer: Self-pay

## 2020-04-10 ENCOUNTER — Encounter: Payer: Self-pay | Admitting: Internal Medicine

## 2020-04-10 ENCOUNTER — Encounter: Payer: Self-pay | Admitting: Psychiatry

## 2020-04-10 DIAGNOSIS — F4322 Adjustment disorder with anxiety: Secondary | ICD-10-CM

## 2020-04-10 NOTE — Progress Notes (Signed)
      [facility] Counselor/Therapist Progress Note  Patient ID: Victorina Kable, MRN: [id number],    Date: 04/10/2020  Time Spent: 50 minutes   Treatment Type: Individual Therapy  Reported Symptoms: anxious  Mental Status Exam:  Appearance:   Casual     Behavior:  Appropriate  Motor:  Normal  Speech/Language:   Clear and Coherent  Affect:  Appropriate  Mood:  anxious  Thought process:  normal  Thought content:    WNL  Sensory/Perceptual disturbances:    WNL  Orientation:  oriented to person, place, time/date and situation  Attention:  Good  Concentration:  Good  Memory:  WNL  Fund of knowledge:   Good  Insight:    Good  Judgment:   Good  Impulse Control:  Good   Risk Assessment: Danger to Self:  No Self-injurious Behavior: No Danger to Others: No Duty to Warn:no Physical Aggression / Violence:No  Access to Firearms a concern: No  Gang Involvement:No   Subjective: The client has recently gotten a new job at [employer].  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  It also has better benefits with insurance, 401(k) and vacation.  Today the client feels that she is on track and doing well.  She has a little bit of anxiety about the transition to this new job.  She has been practicing getting up at 6 AM in preparation for her new 8-5 schedule. I asked the client what she was doing to take care of herself?  She has been trying to have a regular sleep hygiene schedule.  We discussed maybe doing some exercise such as bicycling.  She feels that she would have to time after dinner to be able to do this.  I also encouraged the client to think of other things that she could do to care for herself.  She said that she would increase her number of meetings with AA during the week.  She will also work on expanding her social  network.  She has found a church in [place], [place].  She and her mother attended and found it to be very welcoming and inviting.  The client's overall mood has been positive.  One of her siblings told her she is not expected to take care of their mother since their father died.  She said that was a relief and has taken that to heart.  She agreed to follow-up with treatment as needed going forward.  Interventions: Assertiveness/Communication, Motivational Interviewing, Solution-Oriented/Positive Psychology and Insight-Oriented  Diagnosis:   ICD-10-CM   1. Adjustment disorder with anxiety  F43.22     Plan: Sleep hygiene, exercise, social network, [employer], church attendance, positive self talk, self-care, assertiveness, boundaries.  Walt Geathers, [facility]

## 2020-04-11 ENCOUNTER — Other Ambulatory Visit: Payer: Self-pay | Admitting: Family Medicine

## 2020-04-11 DIAGNOSIS — Z1231 Encounter for screening mammogram for malignant neoplasm of breast: Secondary | ICD-10-CM

## 2020-04-17 ENCOUNTER — Ambulatory Visit (AMBULATORY_SURGERY_CENTER): Payer: No Typology Code available for payment source | Admitting: Internal Medicine

## 2020-04-17 ENCOUNTER — Telehealth: Payer: Self-pay | Admitting: *Deleted

## 2020-04-17 ENCOUNTER — Ambulatory Visit: Payer: No Typology Code available for payment source | Admitting: Neurology

## 2020-04-17 ENCOUNTER — Other Ambulatory Visit: Payer: Self-pay

## 2020-04-17 ENCOUNTER — Encounter: Payer: Self-pay | Admitting: Internal Medicine

## 2020-04-17 VITALS — BP 136/86 | HR 70 | Temp 96.9°F | Resp 16 | Ht 65.0 in | Wt 260.0 lb

## 2020-04-17 DIAGNOSIS — K635 Polyp of colon: Secondary | ICD-10-CM | POA: Diagnosis not present

## 2020-04-17 DIAGNOSIS — D125 Benign neoplasm of sigmoid colon: Secondary | ICD-10-CM

## 2020-04-17 DIAGNOSIS — Z1211 Encounter for screening for malignant neoplasm of colon: Secondary | ICD-10-CM

## 2020-04-17 DIAGNOSIS — D123 Benign neoplasm of transverse colon: Secondary | ICD-10-CM

## 2020-04-17 DIAGNOSIS — D127 Benign neoplasm of rectosigmoid junction: Secondary | ICD-10-CM

## 2020-04-17 MED ORDER — SODIUM CHLORIDE 0.9 % IV SOLN: 500.0000 mL | Freq: Once | INTRAVENOUS | Status: DC

## 2020-04-17 NOTE — Op Note (Signed)
[facility] Patient Name: Gloria [name] Procedure Date: 04/17/2020 10:03 AM MRN: [id number] Endoscopist: Jerene Bears , MD Age: 49 Referring MD:  Date of Birth: October 14, 1971 Gender: Female Account #: [id number] Procedure:                Colonoscopy Indications:              Screening for colorectal malignant neoplasm Medicines:                Monitored Anesthesia Care Procedure:                Pre-Anesthesia Assessment:                           - Prior to the procedure, a History and Physical                            was performed, and patient medications and                            allergies were reviewed. The patient's tolerance of                            previous anesthesia was also reviewed. The risks                            and benefits of the procedure and the sedation                            options and risks were discussed with the patient.                            All questions were answered, and informed consent                            was obtained. Prior Anticoagulants: The patient has                            taken no previous anticoagulant or antiplatelet                            agents. ASA Grade Assessment: III - A patient with                            severe systemic disease. After reviewing the risks                            and benefits, the patient was deemed in                            satisfactory condition to undergo the procedure.                           After obtaining informed consent, the colonoscope  was passed under direct vision. Throughout the                            procedure, the patient's blood pressure, pulse, and                            oxygen saturations were monitored continuously. The                            Colonoscope was introduced through the anus and                            advanced to the cecum, identified by appendiceal                            orifice and  ileocecal valve. The colonoscopy was                            performed without difficulty. The patient tolerated                            the procedure well. The quality of the bowel                            preparation was good. The ileocecal valve,                            appendiceal orifice, and rectum were photographed. Scope In: 10:10:34 AM Scope Out: 10:22:25 AM Scope Withdrawal Time: 0 hours 10 minutes 28 seconds  Total Procedure Duration: 0 hours 11 minutes 51 seconds  Findings:                 The digital rectal exam was normal.                           A 6 mm polyp was found in the transverse colon. The                            polyp was sessile. The polyp was removed with a                            cold snare. Resection and retrieval were complete.                           Two sessile polyps were found in the sigmoid colon.                            The polyps were 4 to 5 mm in size. These polyps                            were removed with a cold snare. Resection and  retrieval were complete.                           Two sessile polyps were found in the recto-sigmoid                            colon. The polyps were 4 to 5 mm in size. These                            polyps were removed with a cold snare. Resection                            and retrieval were complete.                           Internal hemorrhoids were found during                            retroflexion. The hemorrhoids were small. Complications:            No immediate complications. Estimated Blood Loss:     Estimated blood loss was minimal. Impression:               - One 6 mm polyp in the transverse colon, removed                            with a cold snare. Resected and retrieved.                           - Two 4 to 5 mm polyps in the sigmoid colon,                            removed with a cold snare. Resected and retrieved.                           -  Two 4 to 5 mm polyps at the recto-sigmoid colon,                            removed with a cold snare. Resected and retrieved.                           - Internal hemorrhoids. Recommendation:           - Patient has a contact number available for                            emergencies. The signs and symptoms of potential                            delayed complications were discussed with the                            patient. Return to normal activities tomorrow.  Written discharge instructions were provided to the                            patient.                           - Resume previous diet.                           - Continue present medications.                           - Await pathology results.                           - Repeat colonoscopy is recommended. The                            colonoscopy date will be determined after pathology                            results from today's exam become available for                            review. Jerene Bears, MD 04/17/2020 10:26:18 AM This report has been signed electronically.

## 2020-04-17 NOTE — Progress Notes (Signed)
Called to room to assist during endoscopic procedure.  Patient ID and intended procedure confirmed with present staff. Received instructions for my participation in the procedure from the performing physician.  

## 2020-04-17 NOTE — Patient Instructions (Signed)
YOU HAD AN ENDOSCOPIC PROCEDURE TODAY AT [facility]:   Refer to the procedure report that was given to you for any specific questions about what was found during the examination.  If the procedure report does not answer your questions, please call your gastroenterologist to clarify.  If you requested that your care partner not be given the details of your procedure findings, then the procedure report has been included in a sealed envelope for you to review at your convenience later.  YOU SHOULD EXPECT: Some feelings of bloating in the abdomen. Passage of more gas than usual.  Walking can help get rid of the air that was put into your GI tract during the procedure and reduce the bloating. If you had a lower endoscopy (such as a colonoscopy or flexible sigmoidoscopy) you may notice spotting of blood in your stool or on the toilet paper. If you underwent a bowel prep for your procedure, you may not have a normal bowel movement for a few days.  Please Note:  You might notice some irritation and congestion in your nose or some drainage.  This is from the oxygen used during your procedure.  There is no need for concern and it should clear up in a day or so.  SYMPTOMS TO REPORT IMMEDIATELY:   Following lower endoscopy (colonoscopy or flexible sigmoidoscopy):  Excessive amounts of blood in the stool  Significant tenderness or worsening of abdominal pains  Swelling of the abdomen that is new, acute  Fever of 100F or higher   For urgent or emergent issues, a gastroenterologist can be reached at any hour by calling [phone]. Do not use MyChart messaging for urgent concerns.    DIET:  We do recommend a small meal at first, but then you may proceed to your regular diet.  Drink plenty of fluids but you should avoid alcoholic beverages for 24 hours.  MEDICATIONS: Continue present medications.  Please see handouts given to you by your recovery nurse.  ACTIVITY:  You should plan to  take it easy for the rest of today and you should NOT DRIVE or use heavy machinery until tomorrow (because of the sedation medicines used during the test).    FOLLOW UP: Our staff will call the number listed on your records 48-72 hours following your procedure to check on you and address any questions or concerns that you may have regarding the information given to you following your procedure. If we do not reach you, we will leave a message.  We will attempt to reach you two times.  During this call, we will ask if you have developed any symptoms of COVID 19. If you develop any symptoms (ie: fever, flu-like symptoms, shortness of breath, cough etc.) before then, please call [phone].  If you test positive for Covid 19 in the 2 weeks post procedure, please call and report this information to [place].    If any biopsies were taken you will be contacted by phone or by letter within the next 1-3 weeks.  Please call us at [phone] if you have not heard about the biopsies in 3 weeks.   Thank you for allowing [place] to provide for your healthcare needs today.   SIGNATURES/CONFIDENTIALITY: You and/or your care partner have signed paperwork which will be entered into your electronic medical record.  These signatures attest to the fact that that the information above on your After Visit Summary has been reviewed and is understood.  Full responsibility of the  confidentiality of this discharge information lies with you and/or your care-partner.

## 2020-04-17 NOTE — Telephone Encounter (Signed)
-----   Message from Jerene Bears, MD sent at 04/17/2020 11:00 AM EDT ----- Needs OV in Aug or so for NAFLD, followup

## 2020-04-17 NOTE — Progress Notes (Signed)
Pt's states no medical or surgical changes since previsit or office visit. 

## 2020-04-17 NOTE — Telephone Encounter (Signed)
I have spoken to patient whom I have scheduled for 07/21/20 at 3:00 pm. She verbalizes understanding of time and date of appointment.

## 2020-04-17 NOTE — Progress Notes (Signed)
Report to PACU, RN, vss, BBS= Clear.  

## 2020-04-18 ENCOUNTER — Other Ambulatory Visit: Payer: Self-pay | Admitting: Family Medicine

## 2020-04-18 ENCOUNTER — Telehealth: Payer: Self-pay | Admitting: Internal Medicine

## 2020-04-18 DIAGNOSIS — M5136 Other intervertebral disc degeneration, lumbar region: Secondary | ICD-10-CM

## 2020-04-18 NOTE — Telephone Encounter (Signed)
Returned pts call  She states that she woke up this morning with dizziness.  She did not have this yesterday after her procedure.  This is a new symptom for her.  No hx of vertigo.  She is diabetic and I had her check her blood sugar.  It was normal at 96.  She has no other symptoms.  Advised her that it could be dehydration and encouraged her to drink some liquids and that I would send a note to the physician to see what else is recommended.  Please advise.

## 2020-04-18 NOTE — Telephone Encounter (Signed)
Rest at home. Avoid heat and exertion until feeling better. Extra oral hydration today. Resume prior diet. Call or seek care in an urgent care center or ED if symptoms not resolving.

## 2020-04-18 NOTE — Telephone Encounter (Signed)
Pt had a colonoscopy yesterday and stated that she "still feels woozy."

## 2020-04-18 NOTE — Telephone Encounter (Signed)
Phoned pt.  Advised her to take it easy today and to resume regular diet and encouraged fluids. She should seek further treatment if symptoms worsen.   She verbalized understanding.

## 2020-04-20 ENCOUNTER — Telehealth: Payer: Self-pay | Admitting: Physician Assistant

## 2020-04-20 ENCOUNTER — Ambulatory Visit (HOSPITAL_COMMUNITY)
Admission: EM | Admit: 2020-04-20 | Discharge: 2020-04-20 | Disposition: A | Discharge status: Home / Self Care | Payer: No Typology Code available for payment source | Attending: Emergency Medicine | Admitting: Emergency Medicine

## 2020-04-20 ENCOUNTER — Other Ambulatory Visit: Payer: Self-pay

## 2020-04-20 ENCOUNTER — Encounter (HOSPITAL_COMMUNITY): Payer: Self-pay

## 2020-04-20 DIAGNOSIS — R42 Dizziness and giddiness: Secondary | ICD-10-CM

## 2020-04-20 LAB — BASIC METABOLIC PANEL
Anion gap: 13 (ref 5–15)
BUN: 8 mg/dL (ref 6–20)
CO2: 24 mmol/L (ref 22–32)
Calcium: 8.9 mg/dL (ref 8.9–10.3)
Chloride: 103 mmol/L (ref 98–111)
Creatinine, Ser: 0.62 mg/dL (ref 0.44–1.00)
GFR calc Af Amer: >60 mL/min (ref >60)
GFR calc non Af Amer: >60 mL/min (ref >60)
Glucose, Bld: 84 mg/dL (ref 70–99)
Potassium: 4.7 mmol/L (ref 3.5–5.1)
Sodium: 140 mmol/L (ref 135–145)

## 2020-04-20 LAB — CBC
HCT: 43.1 % (ref 36.0–46.0)
Hemoglobin: 13.9 g/dL (ref 12.0–15.0)
MCH: 29.3 pg (ref 26.0–34.0)
MCHC: 32.3 g/dL (ref 30.0–36.0)
MCV: 90.7 fL (ref 80.0–100.0)
Platelets: 269 10*3/uL (ref 150–400)
RBC: 4.75 MIL/uL (ref 3.87–5.11)
RDW: 13.1 % (ref 11.5–15.5)
WBC: 13.9 10*3/uL — ABNORMAL HIGH (ref 4.0–10.5)
nRBC: 0 % (ref 0.0–0.2)

## 2020-04-20 MED ORDER — AMOXICILLIN-POT CLAVULANATE 875-125 MG PO TABS
1.0000 | ORAL_TABLET | Freq: Two times a day (BID) | ORAL | 0 refills | Status: DC
Start: 2020-04-20 — End: 2020-07-31

## 2020-04-20 MED ORDER — AMOXICILLIN-POT CLAVULANATE 875-125 MG PO TABS
1.0000 | ORAL_TABLET | Freq: Two times a day (BID) | ORAL | 0 refills | Status: DC
Start: 2020-04-20 — End: 2020-04-20

## 2020-04-20 MED ORDER — MECLIZINE HCL 25 MG PO TABS
25.0000 mg | ORAL_TABLET | Freq: Three times a day (TID) | ORAL | 0 refills | Status: DC | PRN
Start: 2020-04-20 — End: 2020-04-20

## 2020-04-20 MED ORDER — MECLIZINE HCL 25 MG PO TABS
25.0000 mg | ORAL_TABLET | Freq: Three times a day (TID) | ORAL | 0 refills | Status: DC | PRN
Start: 2020-04-20 — End: 2020-11-03

## 2020-04-20 NOTE — Telephone Encounter (Signed)
04/20/2020 1340  Patient called on-call service this weekend.  Today, patient expresses that she continues to remain dizzy and lightheaded and just feels kind of "swimmy".  Explains that she has been pushing fluids and her urine is a light yellow color.  She has no abdominal pain, nausea or vomiting.  She is worried as the symptoms have kept going since time of colonoscopy.  Tells me she is going to go to the urgent care but just wanted to see if I thought this was related to the procedure or not.  Explained that I do not feel it is related to the procedure given that she is now 3 days out and recommend that she be evaluated with labs etc. for other causes.  I will make Dr. Hilarie Fredrickson aware.  Ellouise Newer, PA-C

## 2020-04-20 NOTE — ED Provider Notes (Addendum)
[place]    CSN: [id number] Arrival date & time: 04/20/20  1635      History   Chief Complaint Chief Complaint  Patient presents with  . Dizziness    HPI Gloria [name] is a 49 y.o. female history of GERD, OSA, diabetes, presenting today for evaluation of dizziness.  Patient reports beginning Friday she began to develop intermittent dizziness which she describes as room spinning.  She reports that prior to Friday she had returned back to [facility] on a plane ride.  The following day she began prep for colonoscopy and had colonoscopy with propofol sedation on Thursday.  Symptoms began the following day.  She has felt foggy and swimmy headed with in her frontal area of her head along with spinning sensations which have been associated with movement.  Most notable with bending and standing upright.  Episodes are brief.  Intensity and length of episodes have improved since initial onset.  She denies any vision changes.  Has had some mild headaches.  Denies chest pain or shortness of breath.  She has began to develop some discomfort in her right ear which she describes as a pressure sensation.  Reports persistent issues with allergies and nasal congestion.  She denies any fevers.  Denies history of hypertension, tobacco use.  She has been trying to drink plenty of fluids as she initially attributed her symptoms to dehydration.  Symptoms have improved, but not fully resolved despite 2 days of oral rehydration.  HPI  Past Medical History:  Diagnosis Date  . Allergy   . Anemia    hx of- prior to hysterectomy  . Anxiety   . Arthritis   . Bulging disc    X 2  . Chronic active hepatitis ([place])   . Chronic back pain 02/25/2015  . Chronic headaches   . Chronic kidney disease    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  . Depression   . Diverticulosis   . Elevated LFTs   . Fibromyalgia   . GERD (gastroesophageal reflux disease)   . History of ETOH abuse   . Hyperlipidemia   .  Obese   . Obstructive sleep apnea 02/25/2015   not diagnosed per pt 04-03-20  . Osteoarthritis    lower back- DDD  . Periodic limb movement disorder 02/25/2014  . Pre-diabetes    pt taking metformin  . RLS (restless legs syndrome)   . Sleep apnea   . Smoker   . Substance abuse [facility])     Patient Active Problem List   Diagnosis Date Noted  . Uncontrolled diabetes mellitus without complication, without long-term current use of insulin 04/06/2019  . Obstructive sleep apnea 02/25/2015  . Chronic back pain 02/25/2015  . Periodic limb movement disorder 02/25/2014  . Restless legs syndrome 12/15/2013  . Cardiac murmur 10/16/2013  . Snoring 10/16/2013  . Hx of migraine headaches 10/16/2013  . Fluid retention 10/12/2013  . Weight gain 10/12/2013  . Anxiety 10/12/2013  . Alcoholic liver disease ([place]) 10/28/2012  . Ex-cigarette smoker 10/28/2012  . Fibromyalgia 10/27/2012  . Depression with anxiety 10/27/2012  . GERD (gastroesophageal reflux disease) 10/27/2012  . H/O ETOH abuse 10/27/2012    Past Surgical History:  Procedure Laterality Date  . CERVICAL FUSION    . COLONOSCOPY    . CYSTOSCOPY  06/06/2013   Procedure: CYSTOSCOPY;  Surgeon: Terrance Mass, MD;  Location: [place] [facility];  Service: Gynecology;;  . INTRAUTERINE DEVICE INSERTION  08/09/2008   PARAGUARD- removed  . kidney  surgery  1995   abcess  . LAPAROSCOPY N/A 06/06/2013   Procedure: LAPAROSCOPY DIAGNOSTIC;  Surgeon: Terrance Mass, MD;  Location: [place] [facility];  Service: Gynecology;  Laterality: N/A;  . LAPAROTOMY  06/06/2013   Procedure: EXPLORATORY LAPAROTOMY;  Surgeon: Terrance Mass, MD;  Location: [place] [facility];  Service: Gynecology;;  . LIVER BIOPSY    . radial tunnel Bilateral 2005  . VAGINAL HYSTERECTOMY Left 06/06/2013   Procedure: Vaginal Hysterectomy with Patiial Right Salpingectomy;  Surgeon: Terrance Mass, MD;  Location: [place] [facility];  Service: Gynecology;  Laterality: Left;    OB History    Gravida  1   Para  1    Term  1   Preterm      AB      Living  1     SAB      TAB      Ectopic      Multiple      Live Births  1            Home Medications    Prior to Admission medications   Medication Sig Start Date End Date Taking? Authorizing Provider  amoxicillin-clavulanate (AUGMENTIN) 875-125 MG tablet Take 1 tablet by mouth every 12 (twelve) hours. 04/20/20   Nastassja Witkop C, PA-C  B Complex Vitamins (B COMPLEX PO) Take 1 tablet by mouth daily.    [provider]  blood glucose meter kit and supplies KIT Dispense based on patient and insurance preference. Use up to four times daily as directed. (FOR ICD-9 250.00, 250.01). 04/11/19   Billie Ruddy, MD  Blood Glucose Monitoring Suppl (ONETOUCH VERIO FLEX SYSTEM) w/Device KIT Use to check Blood sugars twice daily before meals 04/12/19   Billie Ruddy, MD  Calcium Carbonate-Vitamin D (CALCIUM 600/VITAMIN D) 600-400 MG-UNIT per tablet Take 1 tablet by mouth 2 (two) times daily.     [provider]  Carbidopa-Levodopa ER (SINEMET CR) 25-100 MG tablet controlled release TAKE 1 TABLET BY MOUTH AT  BEDTIME 08/21/19   Kathrynn Ducking, MD  Cholecalciferol (VITAMIN D PO) Take by mouth. Takes 5000units  By mouth daily.    [provider]  clotrimazole-betamethasone (LOTRISONE) cream Apply 1 application topically 2 (two) times daily. For 10 days maximum 06/09/19   Marin Olp, MD  Coenzyme Q10 (COQ10) 100 MG CAPS Take 100 mg by mouth daily.    [provider]  EQUETRO 200 MG CP12 12 hr capsule  04/23/19   [provider]  estradiol (CLIMARA - DOSED IN MG/24 HR) 0.05 mg/24hr patch APPLY 1 PATCH TOPICALLY TO  SKIN ONCE A WEEK 11/01/19   Princess Bruins, MD  glipiZIDE (GLUCOTROL) 5 MG tablet TAKE 1 TABLET(5 MG) BY MOUTH DAILY BEFORE BREAKFAST 04/09/20   Billie Ruddy, MD  glucose blood (CONTOUR NEXT TEST) test strip Use to test blood glucose twice daily 01/11/20   Billie Ruddy, MD  ibuprofen  (ADVIL) 800 MG tablet TAKE 1 TABLET(800 MG) BY MOUTH EVERY 8 HOURS AS NEEDED FOR MODERATE PAIN 04/18/20   Billie Ruddy, MD  Lancets (ONETOUCH DELICA PLUS ZOXWRU04V) MISC USE 1  TO CHECK GLUCOSE 4 TIMES DAILY 10/11/19   Billie Ruddy, MD  loratadine (CLARITIN) 10 MG tablet Take 10 mg by mouth daily.    [provider]  LORazepam (ATIVAN) 1 MG tablet Take 1 mg by mouth at bedtime.     [provider]  meclizine (ANTIVERT) 25 MG tablet Take 1 tablet (  25 mg total) by mouth 3 (three) times daily as needed for dizziness. 04/20/20   Hyde Sires C, PA-C  metroNIDAZOLE (METROCREAM) 0.75 % cream daily 11/21/19   [provider]  Multiple Vitamins-Minerals (MULTIVITAMIN PO) Take 1 tablet by mouth daily.     [provider]  nystatin (MYCOSTATIN/NYSTOP) powder Apply topically 2 (two) times daily. 10/27/18   Princess Bruins, MD  OMEGA 3-6-9 FATTY ACIDS PO Take 500 mg by mouth daily.    [provider]  pantoprazole (PROTONIX) 40 MG tablet TAKE 1 TABLET BY MOUTH DAILY Patient taking differently: Takes PRN 03/17/20   Billie Ruddy, MD  PROAIR HFA 108 (90 BASE) MCG/ACT inhaler Inhale 1 puff into the lungs every 4 (four) hours as needed for wheezing or shortness of breath.  08/23/13   [provider]  REXULTI 3 MG TABS Take 1 tablet by mouth daily. 11/04/15   [provider]  rizatriptan (MAXALT) 10 MG tablet Take 1 tablet (10 mg total) by mouth as needed for migraine. May repeat in 2 hours if needed 11/13/19   Billie Ruddy, MD  thiamine (VITAMIN B-1) 50 MG tablet Take 50 mg by mouth daily.    [provider]  vitamin C (ASCORBIC ACID) 500 MG tablet Take 500 mg by mouth daily.    [provider]  Vitamin D, Ergocalciferol, (DRISDOL) 1.25 MG (50000 UT) CAPS capsule Take 50,000 Units by mouth every 7 (seven) days.    [provider]  VITAMIN E PO Take by mouth daily.    [provider]    Family  History Family History  Problem Relation Age of Onset  . Breast cancer Mother   . Colon polyps Father   . Diabetes Father   . Cirrhosis Father   . Liver cancer Father   . Liver disease Father   . Irritable bowel syndrome Sister   . Breast cancer Maternal Aunt   . Breast cancer Paternal Grandmother   . Colon cancer Paternal Grandmother   . Esophageal cancer Paternal Grandfather   . Stomach cancer Paternal Grandfather   . Rectal cancer Neg Hx     Social History Social History   Tobacco Use  . Smoking status: Former Smoker    Packs/day: 1.00    Quit date: 01/26/2012    Years since quitting: 8.2  . Smokeless tobacco: Never Used  Substance Use Topics  . Alcohol use: No    Alcohol/week: 0.0 standard drinks    Comment: History of alcohol abuse  . Drug use: No     Allergies   Benadryl [diphenhydramine hcl], Metformin and related, Nuvigil [armodafinil], and Requip [ropinirole hcl]   Review of Systems Review of Systems  Constitutional: Negative for fatigue and fever.  HENT: Positive for congestion and ear pain. Negative for sinus pressure and sore throat.   Eyes: Negative for photophobia, pain and visual disturbance.  Respiratory: Negative for cough and shortness of breath.   Cardiovascular: Negative for chest pain.  Gastrointestinal: Negative for abdominal pain, nausea and vomiting.  Genitourinary: Negative for decreased urine volume and hematuria.  Musculoskeletal: Negative for myalgias, neck pain and neck stiffness.  Neurological: Positive for dizziness and headaches. Negative for syncope, facial asymmetry, speech difficulty, weakness, light-headedness and numbness.     Physical Exam Triage Vital Signs ED Triage Vitals  Enc Vitals Group     BP 04/20/20 1728 (!) 149/88     Pulse Rate 04/20/20 1728 83     Resp 04/20/20 1728 18  Temp 04/20/20 1728 97.6 F (36.4 C)     Temp src --      SpO2 04/20/20 1728 97 %     Weight --      Height --      Head Circumference  --      Peak Flow --      Pain Score 04/20/20 1727 0     Pain Loc --      Pain Edu? --      Excl. in [place]? --    Orthostatic VS for the past 24 hrs:  BP- Lying Pulse- Lying BP- Sitting Pulse- Sitting BP- Standing at 0 minutes Pulse- Standing at 0 minutes  04/20/20 1809 131/58 81 (!) 141/91 83 141/88 87    Updated Vital Signs BP (!) 149/88   Pulse 83   Temp 97.6 F (36.4 C)   Resp 18   LMP 04/25/2013   SpO2 97%   Visual Acuity Right Eye Distance:   Left Eye Distance:   Bilateral Distance:    Right Eye Near:   Left Eye Near:    Bilateral Near:     Physical Exam Vitals and nursing note reviewed.  Constitutional:      Appearance: She is well-developed.     Comments: No acute distress  HENT:     Head: Normocephalic and atraumatic.     Ears:     Comments: Right TM appears slightly irregular and slightly erythematous, good bony landmarks  Left TM with good cone of light and nonerythematous    Nose: Nose normal.     Mouth/Throat:     Comments: Oral mucosa pink and moist, no tonsillar enlargement or exudate. Posterior pharynx patent and nonerythematous, no uvula deviation or swelling. Normal phonation. Eyes:     Extraocular Movements: Extraocular movements intact.     Conjunctiva/sclera: Conjunctivae normal.     Pupils: Pupils are equal, round, and reactive to light.     Comments: No nystagmus  Cardiovascular:     Rate and Rhythm: Normal rate.  Pulmonary:     Effort: Pulmonary effort is normal. No respiratory distress.     Comments: Breathing comfortably at rest, CTABL, no wheezing, rales or other adventitious sounds auscultated  Abdominal:     General: There is no distension.  Musculoskeletal:        General: Normal range of motion.     Cervical back: Neck supple.  Skin:    General: Skin is warm and dry.  Neurological:     General: No focal deficit present.     Mental Status: She is alert and oriented to person, place, and time. Mental status is at baseline.      Comments: Patient A&O x3, cranial nerves II-XII grossly intact, strength at shoulders, hips and knees 5/5, equal bilaterally, patellar reflex 2+ bilaterally. NormalRAM and heel to shin. Negative Romberg. Gait without abnormality.      UC Treatments / Results  Labs (all labs ordered are listed, but only abnormal results are displayed) Labs Reviewed  CBC - Abnormal; Notable for the following components:      Result Value   WBC 13.9 (*)    All other components within normal limits  BASIC METABOLIC PANEL    EKG   Radiology No results found.  Procedures Procedures (including critical care time)  Medications Ordered in UC Medications - No data to display  Initial Impression / Assessment and Plan / UC Course  I have reviewed the triage vital signs and the nursing  notes.  Pertinent labs & imaging results that were available during my care of the patient were reviewed by me and considered in my medical decision making (see chart for details).   Negative orthostatics.  History highly suggestive of vertigo given spinning sensation with associated with movement.  No neuro deficits on exam, most likely BPPV.  Possible trigger of underlying sinus congestion/sinusitis, treating with Augmentin.  Also recommending meclizine and Epley maneuver.  Continue to focus on hydration with recent colonoscopy.  Monitor for continued gradual improvement of symptoms with time.  Basic labs pending to screen for any underlying electrolyte abnormality, anemia.  Will call with blood work results and alter plan as needed.  Discussed strict return precautions. Patient verbalized understanding and is agreeable with plan.  Final Clinical Impressions(s) / UC Diagnoses   Final diagnoses:  Dizziness  Vertigo     Discharge Instructions     Blood work pending-we will call if anything is abnormal changing the plan Begin Augmentin twice daily for the next week with food to treat for sinusitis/possible early ear  infection Please read attached on vertigo, continue to drink plenty of fluids May use meclizine as needed for dizziness Please try to perform Epley maneuver/ Log roll maneuver for vertigo at home to further help symptoms resolve  If any symptoms changing or worsening please follow-up here or emergency room   ED Prescriptions    Medication Sig Dispense Auth. Provider   amoxicillin-clavulanate (AUGMENTIN) 875-125 MG tablet  (Status: Discontinued) Take 1 tablet by mouth every 12 (twelve) hours. 14 tablet Candence Sease C, PA-C   meclizine (ANTIVERT) 25 MG tablet  (Status: Discontinued) Take 1 tablet (25 mg total) by mouth 3 (three) times daily as needed for dizziness. 30 tablet Karianna Gusman C, PA-C   amoxicillin-clavulanate (AUGMENTIN) 875-125 MG tablet Take 1 tablet by mouth every 12 (twelve) hours. 14 tablet Benney [place] C, PA-C   meclizine (ANTIVERT) 25 MG tablet Take 1 tablet (25 mg total) by mouth 3 (three) times daily as needed for dizziness. 30 tablet Bobby Ragan, [place] C, PA-C     PDMP not reviewed this encounter.   Darleen Moffitt, [place] C, PA-C 04/20/20 2256    Debara Pickett C, PA-C 04/20/20 2257

## 2020-04-20 NOTE — ED Triage Notes (Signed)
Pt presents with c/o dizziness with movement. Pt states that she had colonoscopy on Thursday and symptoms began on Friday

## 2020-04-20 NOTE — Discharge Instructions (Signed)
Blood work pending-we will call if anything is abnormal changing the plan Begin Augmentin twice daily for the next week with food to treat for sinusitis/possible early ear infection Please read attached on vertigo, continue to drink plenty of fluids May use meclizine as needed for dizziness Please try to perform Epley maneuver/ Log roll maneuver for vertigo at home to further help symptoms resolve  If any symptoms changing or worsening please follow-up here or emergency room

## 2020-04-22 ENCOUNTER — Telehealth: Payer: Self-pay | Admitting: *Deleted

## 2020-04-22 NOTE — Telephone Encounter (Signed)
  Follow up Call-  Call back number 04/17/2020  Post procedure Call Back phone  # [phone]  Permission to leave phone message Yes  Some recent data might be hidden     Patient questions:  Do you have a fever, pain , or abdominal swelling? No. Pain Score  0 *  Have you tolerated food without any problems? Yes.    Have you been able to return to your normal activities? Yes.    Do you have any questions about your discharge instructions: Diet   No. Medications  No. Follow up visit  No.  Do you have questions or concerns about your Care? No.  Actions: * If pain score is 4 or above: No action needed, pain <4.  1. Have you developed a fever since your procedure? no  2.   Have you had an respiratory symptoms (SOB or cough) since your procedure? no  3.   Have you tested positive for COVID 19 since your procedure no  4.   Have you had any family members/close contacts diagnosed with the COVID 19 since your procedure?  no   If yes to any of these questions please route to Joylene John, RN and Erenest Rasher, RN

## 2020-04-25 ENCOUNTER — Ambulatory Visit: Payer: No Typology Code available for payment source | Admitting: Psychiatry

## 2020-04-27 ENCOUNTER — Encounter: Payer: Self-pay | Admitting: Internal Medicine

## 2020-05-08 ENCOUNTER — Other Ambulatory Visit: Payer: Self-pay | Admitting: Family Medicine

## 2020-05-15 ENCOUNTER — Ambulatory Visit: Payer: No Typology Code available for payment source | Admitting: Psychiatry

## 2020-05-15 ENCOUNTER — Encounter: Payer: Self-pay | Admitting: Neurology

## 2020-05-15 ENCOUNTER — Ambulatory Visit (INDEPENDENT_AMBULATORY_CARE_PROVIDER_SITE_OTHER): Payer: No Typology Code available for payment source | Admitting: Neurology

## 2020-05-15 ENCOUNTER — Telehealth: Payer: Self-pay | Admitting: Neurology

## 2020-05-15 ENCOUNTER — Other Ambulatory Visit: Payer: Self-pay

## 2020-05-15 VITALS — BP 148/77 | HR 82 | Ht 65.0 in | Wt 267.0 lb

## 2020-05-15 DIAGNOSIS — G2581 Restless legs syndrome: Secondary | ICD-10-CM | POA: Diagnosis not present

## 2020-05-15 DIAGNOSIS — R0683 Snoring: Secondary | ICD-10-CM

## 2020-05-15 DIAGNOSIS — R5382 Chronic fatigue, unspecified: Secondary | ICD-10-CM

## 2020-05-15 MED ORDER — CARBIDOPA-LEVODOPA ER 25-100 MG PO TBCR: 1.0000 | EXTENDED_RELEASE_TABLET | Freq: Every day | ORAL | 3 refills | Status: DC

## 2020-05-15 NOTE — Telephone Encounter (Signed)
Okay with switch.

## 2020-05-15 NOTE — Progress Notes (Signed)
I have read the note, and I agree with the clinical assessment and plan.  Thierry Dobosz K Brenten Janney   

## 2020-05-15 NOTE — Telephone Encounter (Signed)
Pt last seen Dr. Rexene Alberts in 2015. Pt is requesting to see Dr. Brett Fairy. Are you okay with the switch?

## 2020-05-15 NOTE — Progress Notes (Signed)
PATIENT: Gloria [name] DOB: Nov 26, 1970  REASON FOR VISIT: follow up HISTORY FROM: patient  HISTORY OF PRESENT ILLNESS: Today 05/15/20  Gloria [name] is a 49 year old female with history of chronic fatigue and restless leg syndrome.  She has diabetes.  She takes Sinemet for restless legs.  Sinemet seems to be helping, her legs no longer wake her up at night.  Her diabetes is better controlled, recent A1c was 5.3.  She is still fairly active, but does walk her dog about 30 minutes twice daily.  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  She has previously been evaluated for sleep apnea, could not tolerate dental device.  She still has to sleep on her right side with her head propped up, if she rolls over, she will wake up gasping for air. She tracks her sleep on her phone, has frequent waking through the night.  Has been told she snores. Not always, but often complains of significant fatigue upon initial waking, general drowsiness throughout the day.  When she was working from home, would often take naps during the day.  Over the years, her weight fluctuates up and down 30 lbs or so, is currently up.  Presents today for evaluation unaccompanied.  HISTORY 04/19/2019 Dr. Jannifer Franklin: Gloria [name] is a 49 year old right-handed white female with a history of a chronic fatigue issue and history of restless legs.  The patient recent was diagnosed with diabetes, her hemoglobin A1c was 12.  She is now on metformin and a low carbohydrate diet.  The patient has continued to be relatively inactive, she has not engaged in a regular exercise program.  The patient still has to sleep on her right side with her head propped up, if she rolls over, she will wake up gasping.  She is looking for new job with better insurance so she may be able to afford a sleep evaluation in the near future.  Her primary doctor has discussed a low carbohydrate diet and exercise with her.  She takes Sinemet at night for restless leg  syndrome, she finds it this is helpful.  REVIEW OF SYSTEMS: Out of a complete 14 system review of symptoms, the patient complains only of the following symptoms, and all other reviewed systems are negative.  N/A  ALLERGIES: Allergies  Allergen Reactions  . Benadryl [Diphenhydramine Hcl] Hives  . Metformin And Related     Blurred vision  . Nuvigil [Armodafinil]     migraines   . Requip [Ropinirole Hcl]     Increased headache    HOME MEDICATIONS: Outpatient Medications Prior to Visit  Medication Sig Dispense Refill  . amoxicillin-clavulanate (AUGMENTIN) 875-125 MG tablet Take 1 tablet by mouth every 12 (twelve) hours. 14 tablet 0  . B Complex Vitamins (B COMPLEX PO) Take 1 tablet by mouth daily.    . blood glucose meter kit and supplies KIT Dispense based on patient and insurance preference. Use up to four times daily as directed. (FOR ICD-9 250.00, 250.01). 1 each 0  . Calcium Carbonate-Vitamin D (CALCIUM 600/VITAMIN D) 600-400 MG-UNIT per tablet Take 1 tablet by mouth 2 (two) times daily.     . Cholecalciferol (VITAMIN D PO) Take by mouth. Takes 5000units  By mouth daily.    . clotrimazole-betamethasone (LOTRISONE) cream Apply 1 application topically 2 (two) times daily. For 10 days maximum 45 g 1  . Coenzyme Q10 (COQ10) 100 MG CAPS Take 100 mg by mouth daily.    [name] EQUETRO 200 MG  CP12 12 hr capsule     . estradiol (CLIMARA - DOSED IN MG/24 HR) 0.05 mg/24hr patch APPLY 1 PATCH TOPICALLY TO  SKIN ONCE A WEEK 12 patch 4  . glipiZIDE (GLUCOTROL) 5 MG tablet TAKE 1 TABLET(5 MG) BY MOUTH DAILY BEFORE BREAKFAST 30 tablet 3  . glucose blood (CONTOUR NEXT TEST) test strip Use to test blood glucose twice daily 100 each 5  . ibuprofen (ADVIL) 800 MG tablet TAKE 1 TABLET(800 MG) BY MOUTH EVERY 8 HOURS AS NEEDED FOR MODERATE PAIN 30 tablet 3  . loratadine (CLARITIN) 10 MG tablet Take 10 mg by mouth daily.    [name] LORazepam (ATIVAN) 1 MG tablet Take 1 mg by mouth at bedtime.     . meclizine  (ANTIVERT) 25 MG tablet Take 1 tablet (25 mg total) by mouth 3 (three) times daily as needed for dizziness. 30 tablet 0  . metroNIDAZOLE (METROCREAM) 0.75 % cream daily    . Multiple Vitamins-Minerals (MULTIVITAMIN PO) Take 1 tablet by mouth daily.     [name] nystatin (MYCOSTATIN/NYSTOP) powder Apply topically 2 (two) times daily. 15 g 2  . OMEGA 3-6-9 FATTY ACIDS PO Take 500 mg by mouth daily.    . pantoprazole (PROTONIX) 40 MG tablet TAKE 1 TABLET BY MOUTH DAILY 30 tablet 1  . PROAIR HFA 108 (90 BASE) MCG/ACT inhaler Inhale 1 puff into the lungs every 4 (four) hours as needed for wheezing or shortness of breath.     [name] REXULTI 3 MG TABS Take 1 tablet by mouth daily.  1  . rizatriptan (MAXALT) 10 MG tablet Take 1 tablet (10 mg total) by mouth as needed for migraine. May repeat in 2 hours if needed 10 tablet 5  . thiamine (VITAMIN B-1) 50 MG tablet Take 50 mg by mouth daily.    . vitamin C (ASCORBIC ACID) 500 MG tablet Take 500 mg by mouth daily.    . Vitamin D, Ergocalciferol, (DRISDOL) 1.25 MG (50000 UT) CAPS capsule Take 50,000 Units by mouth every 7 (seven) days.    [name] VITAMIN E PO Take by mouth daily.    . Carbidopa-Levodopa ER (SINEMET CR) 25-100 MG tablet controlled release TAKE 1 TABLET BY MOUTH AT  BEDTIME 90 tablet 3  . Blood Glucose Monitoring Suppl (ONETOUCH VERIO FLEX SYSTEM) w/Device KIT Use to check Blood sugars twice daily before meals 1 kit 0  . Lancets (ONETOUCH DELICA PLUS [id number]) MISC USE 1  TO CHECK GLUCOSE 4 TIMES DAILY 100 each 0   Facility-Administered Medications Prior to Visit  Medication Dose Route Frequency Provider Last Rate Last Admin  . 0.9 %  sodium chloride infusion  500 mL Intravenous Once Pyrtle, Lajuan Lines, MD        PAST MEDICAL HISTORY: Past Medical History:  Diagnosis Date  . Allergy   . Anemia    hx of- prior to hysterectomy  . Anxiety   . Arthritis   . Bulging disc    X 2  . Chronic active hepatitis ([place])   . Chronic back pain 02/25/2015  . Chronic  headaches   . Chronic kidney disease    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  . Depression   . Diverticulosis   . Elevated LFTs   . Fibromyalgia   . GERD (gastroesophageal reflux disease)   . History of ETOH abuse   . Hyperlipidemia   . Obese   . Obstructive sleep apnea 02/25/2015   not diagnosed per pt 04-03-20  . Osteoarthritis  lower back- DDD  . Periodic limb movement disorder 02/25/2014  . Pre-diabetes    pt taking metformin  . RLS (restless legs syndrome)   . Sleep apnea   . Smoker   . Substance abuse ([place])     PAST SURGICAL HISTORY: Past Surgical History:  Procedure Laterality Date  . CERVICAL FUSION    . COLONOSCOPY    . CYSTOSCOPY  06/06/2013   Procedure: CYSTOSCOPY;  Surgeon: Terrance Mass, MD;  Location: [facility];  Service: Gynecology;;  . INTRAUTERINE DEVICE INSERTION  08/09/2008   PARAGUARD- removed  . kidney surgery  1995   abcess  . LAPAROSCOPY N/A 06/06/2013   Procedure: LAPAROSCOPY DIAGNOSTIC;  Surgeon: Terrance Mass, MD;  Location: [facility];  Service: Gynecology;  Laterality: N/A;  . LAPAROTOMY  06/06/2013   Procedure: EXPLORATORY LAPAROTOMY;  Surgeon: Terrance Mass, MD;  Location: [facility];  Service: Gynecology;;  . LIVER BIOPSY    . radial tunnel Bilateral 2005  . VAGINAL HYSTERECTOMY Left 06/06/2013   Procedure: Vaginal Hysterectomy with Patiial Right Salpingectomy;  Surgeon: Terrance Mass, MD;  Location: [facility];  Service: Gynecology;  Laterality: Left;    FAMILY HISTORY: Family History  Problem Relation Age of Onset  . Breast cancer Mother   . Colon polyps Father   . Diabetes Father   . Cirrhosis Father   . Liver cancer Father   . Liver disease Father   . Irritable bowel syndrome Sister   . Breast cancer Maternal Aunt   . Breast cancer Paternal Grandmother   . Colon cancer Paternal Grandmother   . Esophageal cancer Paternal Grandfather   . Stomach cancer Paternal Grandfather   . Rectal cancer Neg Hx     SOCIAL HISTORY: Social History    Socioeconomic History  . Marital status: Divorced    Spouse name: Not on file  . Number of children: 1  . Years of education: 63  . Highest education level: Not on file  Occupational History  . Occupation: Child psychotherapist: [employer]  Tobacco Use  . Smoking status: Former Smoker    Packs/day: 1.00    Quit date: 01/26/2012    Years since quitting: 8.3  . Smokeless tobacco: Never Used  Vaping Use  . Vaping Use: Never used  Substance and Sexual Activity  . Alcohol use: No    Alcohol/week: 0.0 standard drinks    Comment: History of alcohol abuse  . Drug use: No  . Sexual activity: Not Currently  Other Topics Concern  . Not on file  Social History Narrative   Patient lives at home and works full time.   Education some college.   Right handed.   Caffeine: 2 cups daily.   Social Determinants of Health   Financial Resource Strain:   . Difficulty of Paying Living Expenses:   Food Insecurity:   . Worried About Charity fundraiser in the Last Year:   . Arboriculturist in the Last Year:   Transportation Needs:   . Film/video editor (Medical):   [name] Lack of Transportation (Non-Medical):   Physical Activity:   . Days of Exercise per Week:   . Minutes of Exercise per Session:   Stress:   . Feeling of Stress :   Social Connections:   . Frequency of Communication with Friends and Family:   . Frequency of Social Gatherings with Friends and Family:   . Attends Religious Services:   .  Active Member of Clubs or Organizations:   . Attends Archivist Meetings:   [name] Marital Status:   Intimate Partner Violence:   . Fear of Current or Ex-Partner:   . Emotionally Abused:   [name] Physically Abused:   . Sexually Abused:    PHYSICAL EXAM  Vitals:   05/15/20 1335  BP: (!) 148/77  Pulse: 82  Weight: 267 lb (121.1 kg)  Height: _0  (1.651 m)   Body mass index is 44.43 kg/m.  Generalized: Well developed, in no acute distress   Neurological  examination  Mentation: Alert oriented to time, place, history taking. Follows all commands speech and language fluent Cranial nerve II-XII: Pupils were equal round reactive to light. Extraocular movements were full, visual field were full on confrontational test. Facial sensation and strength were normal.  Head turning and shoulder shrug were normal and symmetric. Motor: The motor testing reveals 5 over 5 strength of all 4 extremities. Good symmetric motor tone is noted throughout.  Sensory: Sensory testing is intact to soft touch on all 4 extremities. No evidence of extinction is noted.  Coordination: Cerebellar testing reveals good finger-nose-finger and heel-to-shin bilaterally.  Gait and station: Gait is normal.  Reflexes: Deep tendon reflexes are symmetric and normal bilaterally.   DIAGNOSTIC DATA (LABS, IMAGING, TESTING) - I reviewed patient records, labs, notes, testing and imaging myself where available.  Lab Results  Component Value Date   WBC 13.9 (H) 04/20/2020   HGB 13.9 04/20/2020   HCT 43.1 04/20/2020   MCV 90.7 04/20/2020   PLT 269 04/20/2020      Component Value Date/Time   NA 140 04/20/2020 1809   NA 140 10/06/2017 1505   K 4.7 04/20/2020 1809   CL 103 04/20/2020 1809   CO2 24 04/20/2020 1809   GLUCOSE 84 04/20/2020 1809   BUN 8 04/20/2020 1809   BUN 6 10/06/2017 1505   CREATININE 0.62 04/20/2020 1809   CREATININE 0.58 09/03/2016 0830   CALCIUM 8.9 04/20/2020 1809   PROT 7.4 07/18/2019 1410   PROT 6.6 10/06/2017 1505   ALBUMIN 4.4 07/18/2019 1410   ALBUMIN 4.0 10/06/2017 1505   AST 14 07/18/2019 1410   ALT 19 07/18/2019 1410   ALKPHOS 78 07/18/2019 1410   BILITOT 0.3 07/18/2019 1410   BILITOT 0.2 10/06/2017 1505   GFRNONAA >60 04/20/2020 1809   GFRAA >60 04/20/2020 1809   Lab Results  Component Value Date   CHOL 244 (H) 07/18/2019   HDL 38.60 (L) 07/18/2019   LDLCALC 94 09/03/2016   LDLDIRECT 145.0 07/18/2019   TRIG 311.0 (H) 07/18/2019   CHOLHDL  6 07/18/2019   Lab Results  Component Value Date   HGBA1C 5.3 03/13/2020   No results found for: VITAMINB12 Lab Results  Component Value Date   TSH 2.400 10/06/2017      ASSESSMENT AND PLAN 49 y.o. year old female  has a past medical history of Allergy, Anemia, Anxiety, Arthritis, Bulging disc, Chronic active hepatitis ([facility] [place]), Chronic back pain (02/25/2015), Chronic headaches, Chronic kidney disease, Depression, Diverticulosis, Elevated LFTs, Fibromyalgia, GERD (gastroesophageal reflux disease), History of ETOH abuse, Hyperlipidemia, Obese, Obstructive sleep apnea (02/25/2015), Osteoarthritis, Periodic limb movement disorder (02/25/2014), Pre-diabetes, RLS (restless legs syndrome), Sleep apnea, Smoker, and Substance abuse ([place]). here with:  1.  Chronic fatigue 2.  Possible sleep apnea 3.  Restless leg syndrome  Her restless legs continue to be well controlled with Sinemet CR 25-100 mg tablet at bedtime.  I will refer her for sleep  consultation, given report of waking gasping for air, daytime fatigue and drowsiness.  She would like to see Dr. Brett Fairy if possible (her mother is a patient).  She will follow-up in 1 year or sooner if needed.  I spent 20 minutes of face-to-face and non-face-to-face time with patient.  This included previsit chart review, lab review, study review, order entry, electronic health record documentation, patient education.  Butler Denmark, AGNP-C, DNP 05/15/2020, 2:03 PM [facility] [address] [place], [place] [address] [phone]

## 2020-05-15 NOTE — Patient Instructions (Signed)
I will send you for sleep study  Continue Sinemet  See you back in 1 year

## 2020-05-19 NOTE — Telephone Encounter (Signed)
Have I ever met her? I am not sure why she needs to switch- but, I will be happy to see her as a new patient.  

## 2020-05-22 ENCOUNTER — Ambulatory Visit
Admission: RE | Admit: 2020-05-22 | Discharge: 2020-05-22 | Disposition: A | Discharge status: Home / Self Care | Payer: BC Managed Care – PPO | Source: Ambulatory Visit | Attending: Family Medicine | Admitting: Family Medicine

## 2020-05-22 ENCOUNTER — Other Ambulatory Visit: Payer: Self-pay

## 2020-05-22 ENCOUNTER — Ambulatory Visit: Payer: No Typology Code available for payment source

## 2020-05-22 DIAGNOSIS — Z1231 Encounter for screening mammogram for malignant neoplasm of breast: Secondary | ICD-10-CM

## 2020-06-17 ENCOUNTER — Encounter: Payer: Self-pay | Admitting: *Deleted

## 2020-06-22 ENCOUNTER — Encounter: Payer: Self-pay | Admitting: Neurology

## 2020-06-23 ENCOUNTER — Institutional Professional Consult (permissible substitution): Payer: No Typology Code available for payment source | Admitting: Neurology

## 2020-06-26 ENCOUNTER — Ambulatory Visit: Payer: No Typology Code available for payment source | Admitting: Psychiatry

## 2020-06-28 DIAGNOSIS — M533 Sacrococcygeal disorders, not elsewhere classified: Secondary | ICD-10-CM | POA: Insufficient documentation

## 2020-07-10 ENCOUNTER — Other Ambulatory Visit: Payer: Self-pay | Admitting: Family Medicine

## 2020-07-13 ENCOUNTER — Ambulatory Visit: Payer: Self-pay

## 2020-07-21 ENCOUNTER — Other Ambulatory Visit (INDEPENDENT_AMBULATORY_CARE_PROVIDER_SITE_OTHER): Payer: BC Managed Care – PPO

## 2020-07-21 ENCOUNTER — Ambulatory Visit: Payer: BC Managed Care – PPO | Admitting: Internal Medicine

## 2020-07-21 ENCOUNTER — Encounter: Payer: Self-pay | Admitting: Internal Medicine

## 2020-07-21 VITALS — BP 144/80 | HR 84 | Ht 64.5 in | Wt 271.0 lb

## 2020-07-21 DIAGNOSIS — K7581 Nonalcoholic steatohepatitis (NASH): Secondary | ICD-10-CM

## 2020-07-21 DIAGNOSIS — Z6841 Body Mass Index (BMI) 40.0 and over, adult: Secondary | ICD-10-CM | POA: Diagnosis not present

## 2020-07-21 LAB — COMPREHENSIVE METABOLIC PANEL
ALT: 14 U/L (ref 0–35)
AST: 13 U/L (ref 0–37)
Albumin: 4.1 g/dL (ref 3.5–5.2)
Alkaline Phosphatase: 66 U/L (ref 39–117)
BUN: 12 mg/dL (ref 6–23)
CO2: 28 mEq/L (ref 19–32)
Calcium: 8.8 mg/dL (ref 8.4–10.5)
Chloride: 101 mEq/L (ref 96–112)
Creatinine, Ser: 0.65 mg/dL (ref 0.40–1.20)
GFR: 96.29 mL/min (ref >60.00)
Glucose, Bld: 109 mg/dL — ABNORMAL HIGH (ref 70–99)
Potassium: 4.3 mEq/L (ref 3.5–5.1)
Sodium: 137 mEq/L (ref 135–145)
Total Bilirubin: 0.3 mg/dL (ref 0.2–1.2)
Total Protein: 7 g/dL (ref 6.0–8.3)

## 2020-07-21 LAB — CBC WITH DIFFERENTIAL/PLATELET
Basophils Absolute: 0 10*3/uL (ref 0.0–0.1)
Basophils Relative: 0.2 % (ref 0.0–3.0)
Eosinophils Absolute: 0.2 10*3/uL (ref 0.0–0.7)
Eosinophils Relative: 1.8 % (ref 0.0–5.0)
HCT: 40.4 % (ref 36.0–46.0)
Hemoglobin: 13.2 g/dL (ref 12.0–15.0)
Lymphocytes Relative: 26.1 % (ref 12.0–46.0)
Lymphs Abs: 2.8 10*3/uL (ref 0.7–4.0)
MCHC: 32.7 g/dL (ref 30.0–36.0)
MCV: 89.7 fl (ref 78.0–100.0)
Monocytes Absolute: 0.9 10*3/uL (ref 0.1–1.0)
Monocytes Relative: 8.5 % (ref 3.0–12.0)
Neutro Abs: 6.8 10*3/uL (ref 1.4–7.7)
Neutrophils Relative %: 63.4 % (ref 43.0–77.0)
Platelets: 274 10*3/uL (ref 150.0–400.0)
RBC: 4.5 Mil/uL (ref 3.87–5.11)
RDW: 14.5 % (ref 11.5–15.5)
WBC: 10.7 10*3/uL — ABNORMAL HIGH (ref 4.0–10.5)

## 2020-07-21 LAB — PROTIME-INR
INR: 1 ratio (ref 0.8–1.0)
Prothrombin Time: 11.6 s (ref 9.6–13.1)

## 2020-07-21 NOTE — Patient Instructions (Signed)
Your provider has requested that you go to the basement level for lab work before leaving today. Press "B" on the elevator. The lab is located at the first door on the left as you exit the elevator.  You have been scheduled for an abdominal ultrasound at [facility] (1st floor of hospital) on 07/30/20 at 10:30 am. Please arrive 15 minutes prior to your appointment for registration. Make certain not to have anything to eat or drink 6 hours prior to your appointment. Should you need to reschedule your appointment, please contact radiology at [phone]. This test typically takes about 30 minutes to perform.  Please purchase the following medications over the counter and take as directed: Vitamin E 800 IU daily (Take -2- 400IU capsules daily to = 800 IU)  Please follow up with Dr Hilarie Fredrickson in the office in 6 months.  Work on weight reduction of 10% over the next 6 months. Diet and exercise are key!

## 2020-07-21 NOTE — Progress Notes (Signed)
Subjective:    Patient ID: Gloria [name], female    DOB: [birth year], 49 y.o.   MRN: [id number]  HPI Gloria [name] is a 49 year old female with a past medical history of nonalcoholic fatty liver disease (bx 06-Feb-2017, F2 fibrosis), prior history of alcohol abuse in remission for 12 years, history of sessile serrated colon polyps, history of GERD, hypertension, hyperlipidemia, diabetes, struct of sleep apnea, PLMD who is seen for follow-up.  She is here alone today.  She was last seen at the time of her surveillance colonoscopy performed on 04/17/2020.  This revealed 5 polyps ranging in size from 4 to 6 mm.  Only 1 of these was sessile serrated polyp the others were hyperplastic.  There were small internal hemorrhoids found on retroflexion.  She reports that overall she is feeling fairly well.  She did have dizziness occurring after her colonoscopy in May.  She called in around that time and we did not know if it was related to an inner ear problem related to her recent airplane flight, related to the sedation or otherwise.  She was seen by urgent care and it was felt to be a medication reaction not related to propofol for her colonoscopy.  Symptoms have since entirely resolved.  Her biggest issue this year has been 40 pound weight gain.  She reports that she has had sugar craving but has been following a low carbohydrate diet.  She continues glipizide for her diabetes.  She notes that her weight gain really started after her dad died in 06-Feb-2023 from complications of cirrhosis.  She is taking Benefiber daily and bowel movements have been regular.  No blood in stool or melena.  She is using pantoprazole only really as needed which is quite rare given that she is avoiding fatty and greasy foods.  No dysphagia or odynophagia.  She is taking vitamin E daily but she is unsure of the dose.  She recalls last year being told that her triglycerides and cholesterol were elevated.   Review of Systems As per  HPI, otherwise negative  Current Medications, Allergies, Past Medical History, Past Surgical History, Family History and Social History were reviewed in [employer] record.     Objective:   Physical Exam BP (!) 144/80 (BP Location: Left Wrist, Patient Position: Sitting, Cuff Size: Normal)   Pulse 84   Ht 5' 4.5" (1.638 m) Comment: height measured without shoes  Wt 271 lb (122.9 kg)   LMP 04/25/2013   BMI 45.80 kg/m  Gen: awake, alert, NAD HEENT: anicteric CV: RRR, no mrg Pulm: CTA b/l Abd: soft, obese, NT/ND, +BS throughout Ext: no c/c/e Neuro: nonfocal      Assessment & Plan:  49 year old female with a past medical history of nonalcoholic fatty liver disease (bx 02/06/17, F2 fibrosis), prior history of alcohol abuse in remission for 12 years, history of sessile serrated colon polyps, history of GERD, hypertension, hyperlipidemia, diabetes, struct of sleep apnea, PLMD who is seen for follow-up.  1. NASH/NAFLD --we discussed her liver disease at length today and her weight gain will certainly not help her fatty liver disease.  I would like her to try to lose approximately 10% of her body weight in the next 6 to 12 months.  We talked about risk factor modification including controlling her blood pressure, which is slightly elevated today.  We also discussed cholesterol and triglyceride management and statin drug may be beneficial for her.  We also talked about controlling her blood sugars.  She will be following up with Dr. Volanda Napoleon and will call her office for an appointment. --CBC, CMP and INR today --Abdominal ultrasound to reevaluate liver parenchyma and also rule out changes of portal hypertension; there is no evidence for advancing liver disease today clinically --Fibrosis was stage II by biopsy in 2018 --Attempt to lose 10% body weight in the next 6 to 12 months --Diabetes control per primary care; I wonder if Ozempic would be beneficial given the weight reduction  that can be seen with this medication.  I will defer this to Dr. Volanda Napoleon. --Cholesterol and triglyceride management per Dr. Volanda Napoleon; from the liver perspective okay for statin therapy if indicated --Continue vitamin E 800 IU daily --68-monthfollow-up with me  2.  History of colon polyps --sessile serrated colon polyp earlier this year, repeat colonoscopy recommended in 5 years which would be May 2026  3.  GERD --no longer symptomatic with avoiding certain trigger foods.  She is using pantoprazole on an as-needed basis which she can continue.  30 minutes total spent today including patient facing time, coordination of care, reviewing medical history/procedures/pertinent radiology studies, and documentation of the encounter.

## 2020-07-23 ENCOUNTER — Institutional Professional Consult (permissible substitution): Payer: No Typology Code available for payment source | Admitting: Neurology

## 2020-07-30 ENCOUNTER — Other Ambulatory Visit: Payer: Self-pay

## 2020-07-30 ENCOUNTER — Ambulatory Visit (HOSPITAL_COMMUNITY)
Admission: RE | Admit: 2020-07-30 | Discharge: 2020-07-30 | Disposition: A | Discharge status: Home / Self Care | Payer: BC Managed Care – PPO | Source: Ambulatory Visit | Attending: Internal Medicine | Admitting: Internal Medicine

## 2020-07-30 DIAGNOSIS — K7581 Nonalcoholic steatohepatitis (NASH): Secondary | ICD-10-CM | POA: Insufficient documentation

## 2020-07-31 ENCOUNTER — Encounter: Payer: Self-pay | Admitting: Family Medicine

## 2020-07-31 ENCOUNTER — Ambulatory Visit: Payer: BC Managed Care – PPO | Admitting: Family Medicine

## 2020-07-31 VITALS — BP 138/86 | HR 88 | Temp 97.9°F | Wt 271.0 lb

## 2020-07-31 DIAGNOSIS — Z6841 Body Mass Index (BMI) 40.0 and over, adult: Secondary | ICD-10-CM | POA: Diagnosis not present

## 2020-07-31 DIAGNOSIS — E1169 Type 2 diabetes mellitus with other specified complication: Secondary | ICD-10-CM

## 2020-07-31 DIAGNOSIS — K709 Alcoholic liver disease, unspecified: Secondary | ICD-10-CM | POA: Diagnosis not present

## 2020-07-31 LAB — POCT GLYCOSYLATED HEMOGLOBIN (HGB A1C): Hemoglobin A1C: 5.8 % — AB (ref 4.0–5.6)

## 2020-07-31 MED ORDER — RYBELSUS 3 MG PO TABS: 3.0000 mg | ORAL_TABLET | Freq: Every day | ORAL | 0 refills | Status: DC

## 2020-07-31 NOTE — Progress Notes (Signed)
Subjective:    Patient ID: Gloria [name], female    DOB: [birth year], 49 y.o.   MRN: [id number]  No chief complaint on file.   HPI Patient was seen today for f/u on DM. States doing well.  Started a new job in Engineer, mining at [employer].  Pt walking more at her new job.  Pt had imaging of liver done with GI, Dr. Hilarie Fredrickson.  States fatty liver dz noted and pt was strongly encouraged to lose weight.  Pt may eat an Atkins bar and 3 cups of coffee in the morning for breakfast.  A spinach wrap, half an apple and veggie sticks or poppable's for lunch.  Then meat and a side for dinner.  Pt states she was 235 lbs in Feb, now 271 lbs. Hgb A1C was 5.3% 03/13/20.    Past Medical History:  Diagnosis Date  . Allergy   . Anemia    hx of- prior to hysterectomy  . Anxiety   . Arthritis   . Bulging disc    X 2  . Chronic active hepatitis ([place])   . Chronic back pain 02/25/2015  . Chronic headaches   . Chronic kidney disease    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  . Depression   . Diverticulosis   . Elevated LFTs   . Fibromyalgia   . GERD (gastroesophageal reflux disease)   . History of ETOH abuse   . Hyperlipidemia   . Hyperplastic colon polyp   . Internal hemorrhoids   . NAFLD (nonalcoholic fatty liver disease)   . Obese   . Obstructive sleep apnea 02/25/2015   not diagnosed per pt 04-03-20  . Osteoarthritis    lower back- DDD  . Periodic limb movement disorder 02/25/2014  . Pre-diabetes    pt taking metformin  . RLS (restless legs syndrome)   . Sleep apnea   . Smoker   . Substance abuse ([facility])     Allergies  Allergen Reactions  . Benadryl [Diphenhydramine Hcl] Hives  . Metformin And Related     Blurred vision  . Nuvigil [Armodafinil]     migraines   . Requip [Ropinirole Hcl]     Increased headache    ROS General: Denies fever, chills, night sweats, changes in weight, changes in appetite HEENT: Denies headaches, ear pain, changes in vision, rhinorrhea, sore throat CV: Denies CP, palpitations,  SOB, orthopnea Pulm: Denies SOB, cough, wheezing GI: Denies abdominal pain, nausea, vomiting, diarrhea, constipation GU: Denies dysuria, hematuria, frequency, vaginal discharge Msk: Denies muscle cramps, joint pains Neuro: Denies weakness, numbness, tingling Skin: Denies rashes, bruising Psych: Denies depression, anxiety, hallucinations     Objective:    Blood pressure 138/86, pulse 88, temperature 97.9 F (36.6 C), temperature source Oral, weight 271 lb (122.9 kg), last menstrual period 04/25/2013, SpO2 97 %.  Gen. Pleasant, well-nourished, in no distress, normal affect  HEENT: /AT, face symmetric, conjunctiva clear, no scleral icterus, PERRLA, EOMI, nares patent without drainage Lungs: no accessory muscle use Cardiovascular: RRR, no m/r/g, no peripheral edema Musculoskeletal: No deformities, no cyanosis or clubbing, normal tone Neuro:  A&Ox3, CN II-XII intact, normal gait Skin:  Warm, no lesions/ rash   Wt Readings from Last 3 Encounters:  07/31/20 271 lb (122.9 kg)  07/21/20 271 lb (122.9 kg)  05/15/20 267 lb (121.1 kg)    Lab Results  Component Value Date   WBC 10.7 (H) 07/21/2020   HGB 13.2 07/21/2020   HCT 40.4 07/21/2020   PLT 274.0 07/21/2020   GLUCOSE 109 (  H) 07/21/2020   CHOL 244 (H) 07/18/2019   TRIG 311.0 (H) 07/18/2019   HDL 38.60 (L) 07/18/2019   LDLDIRECT 145.0 07/18/2019   LDLCALC 94 09/03/2016   ALT 14 07/21/2020   AST 13 07/21/2020   NA 137 07/21/2020   K 4.3 07/21/2020   CL 101 07/21/2020   CREATININE 0.65 07/21/2020   BUN 12 07/21/2020   CO2 28 07/21/2020   TSH 2.400 10/06/2017   INR 1.0 07/21/2020   HGBA1C 5.8 (A) 07/31/2020   MICROALBUR <0.7 03/13/2020    Assessment/Plan:  Type 2 diabetes mellitus with other specified complication, without long-term current use of insulin ([place])  -Continue lifestyle modifications -Discussed increasing physical activity -Hemoglobin A1c 5.8 % this visit -We will d/c glipizide -We will start Rybelsus  3 mg daily x30 days, then increase to Rybelsus 5 mg. -Pt denies personal or family history of thyroid cancer. -Given information about assistance program for rybelsus -At next [facility] for CPE will do foot exam - Plan: POCT glycosylated hemoglobin (Hb A1C), Semaglutide (RYBELSUS) 3 MG TABS  Class 3 severe obesity due to excess calories with serious comorbidity and body mass index (BMI) of 45.0 to 49.9 in adult [facility]) -Discussed lifestyle modifications and increasing physical activity -Starting Rybelsus for DM which should help patient lose weight. -Follow-up in the next few weeks for CPE and labs.  Alcoholic liver disease ([place]) -[place] abd 07/30/20 results reviewed on pt's phone.  LUQ ultrasound with cholelithiasis, hepatic steatosis and hepatomegaly.  2.8 by 2.4 x 2.9 cm right superior renal pole mass corresponding to the region previously treated interval increase in size compared to ultrasound 12/16/2016 but interval decrease in size compared to CT abdomen 09/25/2008. -Pt advised to refrain from drinking diffuse increase physical activity to help lose weight. -encouraged to have f/u MRI done next yr. -continue f/u with GI  F/u in the next few wks for CPE  Grier Mitts, MD

## 2020-07-31 NOTE — Patient Instructions (Signed)
Semaglutide oral tablets What is this medicine? SEMAGLUTIDE (Sem a GLOO tide) is used to improve blood sugar control in adults with type 2 diabetes. This medicine may be used with other diabetes medicines. This medicine may be used for other purposes; ask your health care provider or pharmacist if you have questions. COMMON BRAND NAME(S): Rybelsus What should I tell my health care provider before I take this medicine? They need to know if you have any of these conditions:  endocrine tumors (MEN 2) or if someone in your family had these tumors  eye disease, vision problems  history of pancreatitis  kidney disease  stomach problems  thyroid cancer or if someone in your family had thyroid cancer  an unusual or allergic reaction to semaglutide, other medicines, foods, dyes, or preservatives  pregnant or trying to get pregnant  breast-feeding How should I use this medicine? Take this medicine by mouth with a glass of plain water that is less than 4 ounces (less than 120 mL). Follow the directions on the prescription label. Do not cut, crush or chew this medicine. Swallow the tablets whole. Take at least 30 minutes before the first food, other beverage, or other oral medications of the day. Take your medicine at regular intervals. Do not take your medicine more often than directed. Do not stop taking except on your doctor's advice. A special MedGuide will be given to you by the pharmacist with each prescription and refill. Be sure to read this information carefully each time. Talk to your pediatrician regarding the use of this medicine in children. Special care may be needed. Overdosage: If you think you have taken too much of this medicine contact a poison control center or emergency room at once. NOTE: This medicine is only for you. Do not share this medicine with others. What if I miss a dose? If you miss a dose, skip it. Take your next dose at the normal time. Do not take extra or 2  doses at the same time to make up for the missed dose. What may interact with this medicine? What may interact with this medicine?  aminophylline  carbamazepine  cyclosporine  digoxin  levothyroxine  other medicines for diabetes  phenytoin  tacrolimus  theophylline  warfarin Many medications may cause changes in blood sugar, these include:  alcohol containing beverages  antiviral medicines for HIV or AIDS  aspirin and aspirin-like drugs  certain medicines for blood pressure, heart disease, irregular heart beat  chromium  diuretics  female hormones, such as estrogens or progestins, birth control pills  fenofibrate  gemfibrozil  isoniazid  lanreotide  female hormones or anabolic steroids  MAOIs like Carbex, Eldepryl, Marplan, Nardil, and Parnate  medicines for weight loss  medicines for allergies, asthma, cold, or cough  medicines for depression, anxiety, or psychotic disturbances  niacin  nicotine  NSAIDs, medicines for pain and inflammation, like ibuprofen or naproxen  octreotide  pasireotide  pentamidine  phenytoin  probenecid  quinolone antibiotics such as ciprofloxacin, levofloxacin, ofloxacin  some herbal dietary supplements  steroid medicines such as prednisone or cortisone  sulfamethoxazole; trimethoprim  thyroid hormones Some medications can hide the warning symptoms of low blood sugar (hypoglycemia). You may need to monitor your blood sugar more closely if you are taking one of these medications. These include:  beta-blockers, often used for high blood pressure or heart problems (examples include atenolol, metoprolol, propranolol)  clonidine  guanethidine  reserpine This list may not describe all possible interactions. Give your health care  provider a list of all the medicines, herbs, non-prescription drugs, or dietary supplements you use. Also tell them if you smoke, drink alcohol, or use illegal drugs. Some items may  interact with your medicine. What should I watch for while using this medicine? Visit your doctor or health care professional for regular checks on your progress. Drink plenty of fluids while taking this medicine. Check with your doctor or health care professional if you get an attack of severe diarrhea, nausea, and vomiting. The loss of too much body fluid can make it dangerous for you to take this medicine. A test called the HbA1C (A1C) will be monitored. This is a simple blood test. It measures your blood sugar control over the last 2 to 3 months. You will receive this test every 3 to 6 months. Learn how to check your blood sugar. Learn the symptoms of low and high blood sugar and how to manage them. Always carry a quick-source of sugar with you in case you have symptoms of low blood sugar. Examples include hard sugar candy or glucose tablets. Make sure others know that you can choke if you eat or drink when you develop serious symptoms of low blood sugar, such as seizures or unconsciousness. They must get medical help at once. Tell your doctor or health care professional if you have high blood sugar. You might need to change the dose of your medicine. If you are sick or exercising more than usual, you might need to change the dose of your medicine. Do not skip meals. Ask your doctor or health care professional if you should avoid alcohol. Many nonprescription cough and cold products contain sugar or alcohol. These can affect blood sugar. Wear a medical ID bracelet or chain, and carry a card that describes your disease and details of your medicine and dosage times. Do not become pregnant while taking this medicine. Women should inform their doctor if they wish to become pregnant or think they might be pregnant. There is a potential for serious side effects to an unborn child. Talk to your health care professional or pharmacist for more information. Do not breast-feed an infant while taking this  medicine. What side effects may I notice from receiving this medicine? Side effects that you should report to your doctor or health care professional as soon as possible:  allergic reactions like skin rash, itching or hives, swelling of the face, lips, or tongue  breathing problems  changes in vision  diarrhea that continues or is severe  lump or swelling on the neck  severe nausea  signs and symptoms of infection like fever or chills; cough; sore throat; pain or trouble passing urine  signs and symptoms of low blood sugar such as feeling anxious, confusion, dizziness, increased hunger, unusually weak or tired, sweating, shakiness, cold, irritable, headache, blurred vision, fast heartbeat, loss of consciousness  signs and symptoms of kidney injury like trouble passing urine or change in the amount of urine  trouble swallowing  unusual stomach upset or pain  vomiting Side effects that usually do not require medical attention (report these to your doctor or health care professional if they continue or are bothersome):  constipation  diarrhea  nausea  stomach upset This list may not describe all possible side effects. Call your doctor for medical advice about side effects. You may report side effects to FDA at 1-800-FDA-1088. Where should I keep my medicine? Keep out of the reach of children. Store at room temperature between 15 and 30  degrees C (59 and 86 degrees F). Keep this medicine in the original blister card until use. Keep in a dry place. Throw away any unused medicine after the expiration date. NOTE: This sheet is a summary. It may not cover all possible information. If you have questions about this medicine, talk to your doctor, pharmacist, or health care provider.  2020 Elsevier/Gold Standard (2018-08-15 10:07:43)  Exercising to Lose Weight Exercise is structured, repetitive physical activity to improve fitness and health. Getting regular exercise is important for  everyone. It is especially important if you are overweight. Being overweight increases your risk of heart disease, stroke, diabetes, high blood pressure, and several types of cancer. Reducing your calorie intake and exercising can help you lose weight. Exercise is usually categorized as moderate or vigorous intensity. To lose weight, most people need to do a certain amount of moderate-intensity or vigorous-intensity exercise each week. Moderate-intensity exercise  Moderate-intensity exercise is any activity that gets you moving enough to burn at least three times more energy (calories) than if you were sitting. Examples of moderate exercise include:  Walking a mile in 15 minutes.  Doing light yard work.  Biking at an easy pace. Most people should get at least 150 minutes (2 hours and 30 minutes) a week of moderate-intensity exercise to maintain their body weight. Vigorous-intensity exercise Vigorous-intensity exercise is any activity that gets you moving enough to burn at least six times more calories than if you were sitting. When you exercise at this intensity, you should be working hard enough that you are not able to carry on a conversation. Examples of vigorous exercise include:  Running.  Playing a team sport, such as football, basketball, and soccer.  Jumping rope. Most people should get at least 75 minutes (1 hour and 15 minutes) a week of vigorous-intensity exercise to maintain their body weight. How can exercise affect me? When you exercise enough to burn more calories than you eat, you lose weight. Exercise also reduces body fat and builds muscle. The more muscle you have, the more calories you burn. Exercise also:  Improves mood.  Reduces stress and tension.  Improves your overall fitness, flexibility, and endurance.  Increases bone strength. The amount of exercise you need to lose weight depends on:  Your age.  The type of exercise.  Any health conditions you  have.  Your overall physical ability. Talk to your health care provider about how much exercise you need and what types of activities are safe for you. What actions can I take to lose weight? Nutrition   Make changes to your diet as told by your health care provider or diet and nutrition specialist (dietitian). This may include: ? Eating fewer calories. ? Eating more protein. ? Eating less unhealthy fats. ? Eating a diet that includes fresh fruits and vegetables, whole grains, low-fat dairy products, and lean protein. ? Avoiding foods with added fat, salt, and sugar.  Drink plenty of water while you exercise to prevent dehydration or heat stroke. Activity  Choose an activity that you enjoy and set realistic goals. Your health care provider can help you make an exercise plan that works for you.  Exercise at a moderate or vigorous intensity most days of the week. ? The intensity of exercise may vary from person to person. You can tell how intense a workout is for you by paying attention to your breathing and heartbeat. Most people will notice their breathing and heartbeat get faster with more intense exercise.  Do  resistance training twice each week, such as: ? Push-ups. ? Sit-ups. ? Lifting weights. ? Using resistance bands.  Getting short amounts of exercise can be just as helpful as long structured periods of exercise. If you have trouble finding time to exercise, try to include exercise in your daily routine. ? Get up, stretch, and walk around every 30 minutes throughout the day. ? Go for a walk during your lunch break. ? Park your car farther away from your destination. ? If you take public transportation, get off one stop early and walk the rest of the way. ? Make phone calls while standing up and walking around. ? Take the stairs instead of elevators or escalators.  Wear comfortable clothes and shoes with good support.  Do not exercise so much that you hurt yourself, feel  dizzy, or get very short of breath. Where to find more information  U.S. Department of Health and Human Services: [employer]at  Centers for Disease Control and Prevention (CDC): http://www.wolf.info/ Contact a health care provider:  Before starting a new exercise program.  If you have questions or concerns about your weight.  If you have a medical problem that keeps you from exercising. Get help right away if you have any of the following while exercising:  Injury.  Dizziness.  Difficulty breathing or shortness of breath that does not go away when you stop exercising.  Chest pain.  Rapid heartbeat. Summary  Being overweight increases your risk of heart disease, stroke, diabetes, high blood pressure, and several types of cancer.  Losing weight happens when you burn more calories than you eat.  Reducing the amount of calories you eat in addition to getting regular moderate or vigorous exercise each week helps you lose weight. This information is not intended to replace advice given to you by your health care provider. Make sure you discuss any questions you have with your health care provider. Document Revised: 11/21/2017 Document Reviewed: 11/21/2017 Elsevier Patient Education  2020 [employer].

## 2020-08-05 ENCOUNTER — Encounter: Payer: Self-pay | Admitting: Family Medicine

## 2020-08-11 ENCOUNTER — Encounter: Payer: Self-pay | Admitting: Family Medicine

## 2020-08-14 ENCOUNTER — Telehealth: Payer: Self-pay | Admitting: Family Medicine

## 2020-08-14 NOTE — Telephone Encounter (Signed)
Patient is taking Rybelsus-- since she started taking it she has had depression symptoms, her Fibromyalgia has flared up, and today she had to call in to work.  .She is requesting a call back.

## 2020-08-14 NOTE — Telephone Encounter (Signed)
That is fine to stop until she follows up with Dr. Volanda Napoleon

## 2020-08-14 NOTE — Telephone Encounter (Signed)
Pt wants advise if she can stop taking medication until Dr Volanda Napoleon returns to the office and she will schedule appointment to see her then. Please advise

## 2020-08-14 NOTE — Telephone Encounter (Signed)
Spoke with pt verbalized understanding that Gloria [name] advises her to stop taking medications until pt sees Dr Volanda Napoleon in October

## 2020-08-14 NOTE — Telephone Encounter (Signed)
Pt is calling back to see what the results was from the below msg.

## 2020-08-28 ENCOUNTER — Encounter: Payer: BC Managed Care – PPO | Admitting: Family Medicine

## 2020-08-28 ENCOUNTER — Institutional Professional Consult (permissible substitution): Payer: No Typology Code available for payment source | Admitting: Neurology

## 2020-08-29 ENCOUNTER — Encounter: Payer: BC Managed Care – PPO | Admitting: Family Medicine

## 2020-09-01 MED ORDER — ESTRADIOL 0.05 MG/24HR TD PTWK: MEDICATED_PATCH | TRANSDERMAL | 0 refills | Status: DC

## 2020-09-03 ENCOUNTER — Ambulatory Visit: Payer: No Typology Code available for payment source | Admitting: Psychiatry

## 2020-09-19 ENCOUNTER — Other Ambulatory Visit: Payer: Self-pay

## 2020-09-19 ENCOUNTER — Ambulatory Visit (INDEPENDENT_AMBULATORY_CARE_PROVIDER_SITE_OTHER): Payer: BC Managed Care – PPO | Admitting: Family Medicine

## 2020-09-19 ENCOUNTER — Encounter: Payer: Self-pay | Admitting: Family Medicine

## 2020-09-19 VITALS — BP 132/80 | HR 81 | Temp 97.8°F | Wt 263.8 lb

## 2020-09-19 DIAGNOSIS — K700 Alcoholic fatty liver: Secondary | ICD-10-CM

## 2020-09-19 DIAGNOSIS — E782 Mixed hyperlipidemia: Secondary | ICD-10-CM

## 2020-09-19 DIAGNOSIS — Z23 Encounter for immunization: Secondary | ICD-10-CM

## 2020-09-19 DIAGNOSIS — Z0000 Encounter for general adult medical examination without abnormal findings: Secondary | ICD-10-CM | POA: Diagnosis not present

## 2020-09-19 DIAGNOSIS — G2581 Restless legs syndrome: Secondary | ICD-10-CM | POA: Diagnosis not present

## 2020-09-19 DIAGNOSIS — E1169 Type 2 diabetes mellitus with other specified complication: Secondary | ICD-10-CM | POA: Diagnosis not present

## 2020-09-19 DIAGNOSIS — I10 Essential (primary) hypertension: Secondary | ICD-10-CM

## 2020-09-19 NOTE — Progress Notes (Signed)
Subjective:     Gloria [name] is a 49 y.o. female with pmh sig for DM II, OSA, alcoholic fatty liver disease, GERD, OA, h/o renal dz, RLS, former smoker, obesity, fibromyalgia, h/o anxiety, h/o depression, and seasonal allergies who is here for a comprehensive physical exam.  Pt states she is doing well.  Trying to get back to eating better and exercising.  Pt tried Rybelsus to help with DM and weight loss,  however it caused depression and fibromyalgia to flair.  Pt states sleep is ok, but often interrupted due to RLS.  Pt had a sleep study done several years ago.  Has an appointment with Dr. Brett Fairy.  Pt has been sober x 12 yrs.  She speaks to her sponsor regularly.  Pt's sponsor mentioned pt's mood is affected around certain times each year.    Colonoscopy completed 04/17/2020.  Repeat due in 10 years.  Mammogram done 05/22/2020.  Patient followed by OB/GYN for Paps.  Last Pap 10/21/2017. Social History   Socioeconomic History  . Marital status: Divorced    Spouse name: Not on file  . Number of children: 1  . Years of education: 2  . Highest education level: Not on file  Occupational History  . Occupation: Child psychotherapist: [employer]  Tobacco Use  . Smoking status: Former Smoker    Packs/day: 1.00    Quit date: 01/26/2012    Years since quitting: 8.6  . Smokeless tobacco: Never Used  Vaping Use  . Vaping Use: Never used  Substance and Sexual Activity  . Alcohol use: No    Alcohol/week: 0.0 standard drinks    Comment: History of alcohol abuse  . Drug use: No  . Sexual activity: Not Currently  Other Topics Concern  . Not on file  Social History Narrative   Patient lives at home and works full time.   Education some college.   Right handed.   Caffeine: 2 cups daily.   Social Determinants of Health   Financial Resource Strain:   . Difficulty of Paying Living Expenses: Not on file  Food Insecurity:   . Worried About Charity fundraiser in the  Last Year: Not on file  . Ran Out of Food in the Last Year: Not on file  Transportation Needs:   . Lack of Transportation (Medical): Not on file  . Lack of Transportation (Non-Medical): Not on file  Physical Activity:   . Days of Exercise per Week: Not on file  . Minutes of Exercise per Session: Not on file  Stress:   . Feeling of Stress : Not on file  Social Connections:   . Frequency of Communication with Friends and Family: Not on file  . Frequency of Social Gatherings with Friends and Family: Not on file  . Attends Religious Services: Not on file  . Active Member of Clubs or Organizations: Not on file  . Attends Archivist Meetings: Not on file  . Marital Status: Not on file  Intimate Partner Violence:   . Fear of Current or Ex-Partner: Not on file  . Emotionally Abused: Not on file  . Physically Abused: Not on file  . Sexually Abused: Not on file   Health Maintenance  Topic Date Due  . Hepatitis C Screening  Never done  . PNEUMOCOCCAL POLYSACCHARIDE VACCINE AGE 44-64 HIGH RISK  Never done  . OPHTHALMOLOGY EXAM  Never done  . HIV Screening  Never done  . PAP SMEAR-Modifier  10/21/2018  . FOOT EXAM  04/10/2020  . HEMOGLOBIN A1C  01/28/2021  . URINE MICROALBUMIN  03/13/2021  . MAMMOGRAM  05/22/2021  . TETANUS/TDAP  10/27/2022  . COLONOSCOPY  04/17/2025  . INFLUENZA VACCINE  Completed  . COVID-19 Vaccine  Completed    The following portions of the patient's history were reviewed and updated as appropriate: allergies, current medications, past family history, past medical history, past social history, past surgical history and problem list.  Review of Systems Pertinent items noted in HPI and remainder of comprehensive ROS otherwise negative.   Objective:    BP 132/80 (BP Location: Left Arm, Patient Position: Sitting, Cuff Size: Large)   Pulse 81   Temp 97.8 F (36.6 C) (Oral)   Wt 263 lb 12.8 oz (119.7 kg)   LMP 04/25/2013   SpO2 97%   BMI 44.58 kg/m   General appearance: alert, cooperative and no distress Head: Normocephalic, without obvious abnormality, atraumatic Eyes: conjunctivae/corneas clear. PERRL, EOM's intact. Fundi benign. Ears: normal TM's and external ear canals both ears Nose: Nares normal. Septum midline. Mucosa normal. No drainage or sinus tenderness. Throat: lips, mucosa, and tongue normal; teeth and gums normal Neck: no adenopathy, no carotid bruit, no JVD, supple, symmetrical, trachea midline and thyroid not enlarged, symmetric, no tenderness/mass/nodules Lungs: clear to auscultation bilaterally Heart: regular rate and rhythm, S1, S2 normal, no murmur, click, rub or gallop Abdomen: soft, non-tender; bowel sounds normal; no masses,  no organomegaly Extremities: extremities normal, atraumatic, no cyanosis or edema Pulses: 2+ and symmetric Skin: Skin color, texture, turgor normal. No rashes or lesions Lymph nodes: Cervical, supraclavicular, and axillary nodes normal. Neurologic: Alert and oriented X 3, normal strength and tone. Normal symmetric reflexes. Normal coordination and gait    Assessment:    Healthy female exam.      Plan:     Anticipatory guidance given including wearing seatbelts, smoke detectors in the home, increasing physical activity, increasing p.o. intake of water and vegetables. -will obtain labs -Mammogram up-to-date.  Done 05/22/2020 -Colonoscopy done 04/17/2020.  Repeat in 10 years -Status post hysterectomy.  Followed by OB/GYN -PHQ-9 score 2 -GAD-7 score 2 -Given handout -Next CPE in 1 year See After Visit Summary for Counseling Recommendations   Type 2 diabetes mellitus with other specified complication, without long-term current use of insulin (HCC)  -Hemoglobin A1c 5.8% on 07/31/2020 -Continue lifestyle modifications including diet and exercise -Will consider other medication options to help with weight loss -We will obtain foot exam at next office visit -Pt to schedule eye exam. - Plan:  Hemoglobin A1c, Lipid panel  Restless legs syndrome  - Plan: CBC with Differential/Platelet, TSH, T4, Free, CMP with eGFR([facility])  Alcohol induced fatty liver  -Congratulated on being sober x12 years -Continue lifestyle modifications - Plan: CBC with Differential/Platelet, CMP with eGFR([facility]), lipid panel  Mixed hyperlipidemia  -Continue lifestyle modifications - Plan: Lipid panel  Need for immunization against influenza  - Plan: Flu Vaccine QUAD 6+ mos PF IM (Fluarix Quad PF)  Essential hypertension -BP elevated in clinic and at several previous OFVs -Lifestyle modifications encouraged -For continued elevation at next OFV will start low-dose ACE-I -Consider checking BP at home and keep a log to bring with you to clinic -Repeat sleep study encouraged  Follow-up in the next 1-2 Months  Grier Mitts, MD

## 2020-09-19 NOTE — Patient Instructions (Signed)
Preventive Care 40-50 Years Old, Female Preventive care refers to visits with your health care provider and lifestyle choices that can promote health and wellness. This includes:  A yearly physical exam. This may also be called an annual well check.  Regular dental visits and eye exams.  Immunizations.  Screening for certain conditions.  Healthy lifestyle choices, such as eating a healthy diet, getting regular exercise, not using drugs or products that contain nicotine and tobacco, and limiting alcohol use. What can I expect for my preventive care visit? Physical exam Your health care provider will check your:  Height and weight. This may be used to calculate body mass index (BMI), which tells if you are at a healthy weight.  Heart rate and blood pressure.  Skin for abnormal spots. Counseling Your health care provider may ask you questions about your:  Alcohol, tobacco, and drug use.  Emotional well-being.  Home and relationship well-being.  Sexual activity.  Eating habits.  Work and work environment.  Method of birth control.  Menstrual cycle.  Pregnancy history. What immunizations do I need?  Influenza (flu) vaccine  This is recommended every year. Tetanus, diphtheria, and pertussis (Tdap) vaccine  You may need a Td booster every 10 years. Varicella (chickenpox) vaccine  You may need this if you have not been vaccinated. Zoster (shingles) vaccine  You may need this after age 60. Measles, mumps, and rubella (MMR) vaccine  You may need at least one dose of MMR if you were born in 1957 or later. You may also need a second dose. Pneumococcal conjugate (PCV13) vaccine  You may need this if you have certain conditions and were not previously vaccinated. Pneumococcal polysaccharide (PPSV23) vaccine  You may need one or two doses if you smoke cigarettes or if you have certain conditions. Meningococcal conjugate (MenACWY) vaccine  You may need this if you  have certain conditions. Hepatitis A vaccine  You may need this if you have certain conditions or if you travel or work in places where you may be exposed to hepatitis A. Hepatitis B vaccine  You may need this if you have certain conditions or if you travel or work in places where you may be exposed to hepatitis B. Haemophilus influenzae type b (Hib) vaccine  You may need this if you have certain conditions. Human papillomavirus (HPV) vaccine  If recommended by your health care provider, you may need three doses over 6 months. You may receive vaccines as individual doses or as more than one vaccine together in one shot (combination vaccines). Talk with your health care provider about the risks and benefits of combination vaccines. What tests do I need? Blood tests  Lipid and cholesterol levels. These may be checked every 5 years, or more frequently if you are over 50 years old.  Hepatitis C test.  Hepatitis B test. Screening  Lung cancer screening. You may have this screening every year starting at age 55 if you have a 30-pack-year history of smoking and currently smoke or have quit within the past 15 years.  Colorectal cancer screening. All adults should have this screening starting at age 50 and continuing until age 75. Your health care provider may recommend screening at age 45 if you are at increased risk. You will have tests every 1-10 years, depending on your results and the type of screening test.  Diabetes screening. This is done by checking your blood sugar (glucose) after you have not eaten for a while (fasting). You may have this   done every 1-3 years.  Mammogram. This may be done every 1-2 years. Talk with your health care provider about when you should start having regular mammograms. This may depend on whether you have a family history of breast cancer.  BRCA-related cancer screening. This may be done if you have a family history of breast, ovarian, tubal, or peritoneal  cancers.  Pelvic exam and Pap test. This may be done every 3 years starting at age 12. Starting at age 32, this may be done every 5 years if you have a Pap test in combination with an HPV test. Other tests  Sexually transmitted disease (STD) testing.  Bone density scan. This is done to screen for osteoporosis. You may have this scan if you are at high risk for osteoporosis. Follow these instructions at home: Eating and drinking  Eat a diet that includes fresh fruits and vegetables, whole grains, lean protein, and low-fat dairy.  Take vitamin and mineral supplements as recommended by your health care provider.  Do not drink alcohol if: ? Your health care provider tells you not to drink. ? You are pregnant, may be pregnant, or are planning to become pregnant.  If you drink alcohol: ? Limit how much you have to 0-1 drink a day. ? Be aware of how much alcohol is in your drink. In the [place] one drink equals one 12 oz bottle of beer (355 mL), one 5 oz glass of wine (148 mL), or one 1 oz glass of hard liquor (44 mL). Lifestyle  Take daily care of your teeth and gums.  Stay active. Exercise for at least 30 minutes on 5 or more days each week.  Do not use any products that contain nicotine or tobacco, such as cigarettes, e-cigarettes, and chewing tobacco. If you need help quitting, ask your health care provider.  If you are sexually active, practice safe sex. Use a condom or other form of birth control (contraception) in order to prevent pregnancy and STIs (sexually transmitted infections).  If told by your health care provider, take low-dose aspirin daily starting at age 36. What's next?  Visit your health care provider once a year for a well check visit.  Ask your health care provider how often you should have your eyes and teeth checked.  Stay up to date on all vaccines. This information is not intended to replace advice given to you by your health care provider. Make sure you  discuss any questions you have with your health care provider. Document Revised: 07/20/2018 Document Reviewed: 07/20/2018 Elsevier Patient Education  2020 [place].  Preventing High Cholesterol Cholesterol is a white, waxy substance similar to fat that the human body needs to help build cells. The liver makes all the cholesterol that a person's body needs. Having high cholesterol (hypercholesterolemia) increases a person's risk for heart disease and stroke. Extra (excess) cholesterol comes from the food the person eats. High cholesterol can often be prevented with diet and lifestyle changes. If you already have high cholesterol, you can control it with diet and lifestyle changes and with medicine. How can high cholesterol affect me? If you have high cholesterol, deposits (plaques) may build up on the walls of your arteries. The arteries are the blood vessels that carry blood away from your heart. Plaques make the arteries narrower and stiffer. This can limit or block blood flow and cause blood clots to form. Blood clots:  Are tiny balls of cells that form in your blood.  Can move to the  heart or brain, causing a heart attack or stroke. Plaques in arteries greatly increase your risk for heart attack and stroke.Making diet and lifestyle changes can reduce your risk for these conditions that may threaten your life. What can increase my risk? This condition is more likely to develop in people who:  Eat foods that are high in saturated fat or cholesterol. Saturated fat is mostly found in: ? Foods that contain animal fat, such as red meat and some dairy products. ? Certain fatty foods made from plants, such as tropical oils.  Are overweight.  Are not getting enough exercise.  Have a family history of high cholesterol. What actions can I take to prevent this? Nutrition   Eat less saturated fat.  Avoid trans fats (partially hydrogenated oils). These are often found in margarine and in  some baked goods, fried foods, and snacks bought in packages.  Avoid precooked or cured meat, such as sausages or meat loaves.  Avoid foods and drinks that have added sugars.  Eat more fruits, vegetables, and whole grains.  Choose healthy sources of protein, such as fish, poultry, lean cuts of red meat, beans, peas, lentils, and nuts.  Choose healthy sources of fat, such as: ? Nuts. ? Vegetable oils, especially olive oil. ? Fish that have healthy fats (omega-3 fatty acids), such as mackerel or salmon. The items listed above may not be a complete list of recommended foods and beverages. Contact a dietitian for more information. Lifestyle  Lose weight if you are overweight. Losing 5-10 lb (2.3-4.5 kg) can help prevent or control high cholesterol. It can also lower your risk for diabetes and high blood pressure. Ask your health care provider to help you with a diet and exercise plan to lose weight safely.  Do not use any products that contain nicotine or tobacco, such as cigarettes, e-cigarettes, and chewing tobacco. If you need help quitting, ask your health care provider.  Limit your alcohol intake. ? Do not drink alcohol if:  Your health care provider tells you not to drink.  You are pregnant, may be pregnant, or are planning to become pregnant. ? If you drink alcohol:  Limit how much you use to:  0-1 drink a day for women.  0-2 drinks a day for men.  Be aware of how much alcohol is in your drink. In the [place] one drink equals one 12 oz bottle of beer (355 mL), one 5 oz glass of wine (148 mL), or one 1 oz glass of hard liquor (44 mL). Activity   Get enough exercise. Each week, do at least 150 minutes of exercise that takes a medium level of effort (moderate-intensity exercise). ? This is exercise that:  Makes your heart beat faster and makes you breathe harder than usual.  Allows you to still be able to talk. ? You could exercise in short sessions several times a day or  longer sessions a few times a week. For example, on 5 days each week, you could walk fast or ride your bike 3 times a day for 10 minutes each time.  Do exercises as told by your health care provider. Medicines  In addition to diet and lifestyle changes, your health care provider may recommend medicines to help lower cholesterol. This may be a medicine to lower the amount of cholesterol your liver makes. You may need medicine if: ? Diet and lifestyle changes do not lower your cholesterol enough. ? You have high cholesterol and other risk factors for heart disease  or stroke.  Take over-the-counter and prescription medicines only as told by your health care provider. General information  Manage your risk factors for high cholesterol. Talk with your health care provider about all your risk factors and how to lower your risk.  Manage other conditions that you have, such as diabetes or high blood pressure (hypertension).  Have blood tests to check your cholesterol levels at regular points in time as told by your health care provider.  Keep all follow-up visits as told by your health care provider. This is important. Where to find more information  American Heart Association: www.heart.org  National Heart, Lung, and Blood Institute: https://wilson-eaton.com/ Summary  High cholesterol increases your risk for heart disease and stroke. By keeping your cholesterol level low, you can reduce your risk for these conditions.  High cholesterol can often be prevented with diet and lifestyle changes.  Work with your health care provider to manage your risk factors, and have your blood tested regularly. This information is not intended to replace advice given to you by your health care provider. Make sure you discuss any questions you have with your health care provider. Document Revised: 03/02/2019 Document Reviewed: 07/17/2016 Elsevier Patient Education  [place].  Fatty Liver Disease  Fatty  liver disease occurs when too much fat has built up in your liver cells. Fatty liver disease is also called hepatic steatosis or steatohepatitis. The liver removes harmful substances from your bloodstream and produces fluids that your body needs. It also helps your body use and store energy from the food you eat. In many cases, fatty liver disease does not cause symptoms or problems. It is often diagnosed when tests are being done for other reasons. However, over time, fatty liver can cause inflammation that may lead to more serious liver problems, such as scarring of the liver (cirrhosis) and liver failure. Fatty liver is associated with insulin resistance, increased body fat, high blood pressure (hypertension), and high cholesterol. These are features of metabolic syndrome and increase your risk for stroke, diabetes, and heart disease. What are the causes? This condition may be caused by:  Drinking too much alcohol.  Poor nutrition.  Obesity.  Cushing's syndrome.  Diabetes.  High cholesterol.  Certain drugs.  Poisons.  Some viral infections.  Pregnancy. What increases the risk? You are more likely to develop this condition if you:  Abuse alcohol.  Are overweight.  Have diabetes.  Have hepatitis.  Have a high triglyceride level.  Are pregnant. What are the signs or symptoms? Fatty liver disease often does not cause symptoms. If symptoms do develop, they can include:  Fatigue.  Weakness.  Weight loss.  Confusion.  Abdominal pain.  Nausea and vomiting.  Yellowing of your skin and the white parts of your eyes (jaundice).  Itchy skin. How is this diagnosed? This condition may be diagnosed by:  A physical exam and medical history.  Blood tests.  Imaging tests, such as an ultrasound, CT scan, or MRI.  A liver biopsy. A small sample of liver tissue is removed using a needle. The sample is then looked at under a microscope. How is this treated? Fatty liver  disease is often caused by other health conditions. Treatment for fatty liver may involve medicines and lifestyle changes to manage conditions such as:  Alcoholism.  High cholesterol.  Diabetes.  Being overweight or obese. Follow these instructions at home:   Do not drink alcohol. If you have trouble quitting, ask your health care provider how to  safely quit with the help of medicine or a supervised program. This is important to keep your condition from getting worse.  Eat a healthy diet as told by your health care provider. Ask your health care provider about working with a diet and nutrition specialist (dietitian) to develop an eating plan.  Exercise regularly. This can help you lose weight and control your cholesterol and diabetes. Talk to your health care provider about an exercise plan and which activities are best for you.  Take over-the-counter and prescription medicines only as told by your health care provider.  Keep all follow-up visits as told by your health care provider. This is important. Contact a health care provider if: You have trouble controlling your:  Blood sugar. This is especially important if you have diabetes.  Cholesterol.  Drinking of alcohol. Get help right away if:  You have abdominal pain.  You have jaundice.  You have nausea and vomiting.  You vomit blood or material that looks like coffee grounds.  You have stools that are black, tar-like, or bloody. Summary  Fatty liver disease develops when too much fat builds up in the cells of your liver.  Fatty liver disease often causes no symptoms or problems. However, over time, fatty liver can cause inflammation that may lead to more serious liver problems, such as scarring of the liver (cirrhosis).  You are more likely to develop this condition if you abuse alcohol, are pregnant, are overweight, have diabetes, have hepatitis, or have high triglyceride levels.  Contact your health care provider  if you have trouble controlling your weight, blood sugar, cholesterol, or drinking of alcohol. This information is not intended to replace advice given to you by your health care provider. Make sure you discuss any questions you have with your health care provider. Document Revised: 10/21/2017 Document Reviewed: 08/17/2017 Elsevier Patient Education  2020 [place].  Diabetes Basics  Diabetes (diabetes mellitus) is a long-term (chronic) disease. It occurs when the body does not properly use sugar (glucose) that is released from food after you eat. Diabetes may be caused by one or both of these problems:  Your pancreas does not make enough of a hormone called insulin.  Your body does not react in a normal way to insulin that it makes. Insulin lets sugars (glucose) go into cells in your body. This gives you energy. If you have diabetes, sugars cannot get into cells. This causes high blood sugar (hyperglycemia). Follow these instructions at home: How is diabetes treated? You may need to take insulin or other diabetes medicines daily to keep your blood sugar in balance. Take your diabetes medicines every day as told by your doctor. List your diabetes medicines here: Diabetes medicines  Name of medicine: ______________________________ ? Amount (dose): _______________ Time (a.m./p.m.): _______________ Notes: ___________________________________  Name of medicine: ______________________________ ? Amount (dose): _______________ Time (a.m./p.m.): _______________ Notes: ___________________________________  Name of medicine: ______________________________ ? Amount (dose): _______________ Time (a.m./p.m.): _______________ Notes: ___________________________________ If you use insulin, you will learn how to give yourself insulin by injection. You may need to adjust the amount based on the food that you eat. List the types of insulin you use here: Insulin  Insulin type:  ______________________________ ? Amount (dose): _______________ Time (a.m./p.m.): _______________ Notes: ___________________________________  Insulin type: ______________________________ ? Amount (dose): _______________ Time (a.m./p.m.): _______________ Notes: ___________________________________  Insulin type: ______________________________ ? Amount (dose): _______________ Time (a.m./p.m.): _______________ Notes: ___________________________________  Insulin type: ______________________________ ? Amount (dose): _______________ Time (a.m./p.m.): _______________ Notes: ___________________________________  Insulin type:  ______________________________ ? Amount (dose): _______________ Time (a.m./p.m.): _______________ Notes: ___________________________________ How do I manage my blood sugar?  Check your blood sugar levels using a blood glucose monitor as directed by your doctor. Your doctor will set treatment goals for you. Generally, you should have these blood sugar levels:  Before meals (preprandial): 80-130 mg/dL (4.4-7.2 mmol/L).  After meals (postprandial): below 180 mg/dL (10 mmol/L).  A1c level: less than 7%. Write down the times that you will check your blood sugar levels: Blood sugar checks  Time: _______________ Notes: ___________________________________  Time: _______________ Notes: ___________________________________  Time: _______________ Notes: ___________________________________  Time: _______________ Notes: ___________________________________  Time: _______________ Notes: ___________________________________  Time: _______________ Notes: ___________________________________  What do I need to know about low blood sugar? Low blood sugar is called hypoglycemia. This is when blood sugar is at or below 70 mg/dL (3.9 mmol/L). Symptoms may include:  Feeling: ? Hungry. ? Worried or nervous (anxious). ? Sweaty and clammy. ? Confused. ? Dizzy. ? Sleepy. ? Sick to your  stomach (nauseous).  Having: ? A fast heartbeat. ? A headache. ? A change in your vision. ? Tingling or no feeling (numbness) around the mouth, lips, or tongue. ? Jerky movements that you cannot control (seizure).  Having trouble with: ? Moving (coordination). ? Sleeping. ? Passing out (fainting). ? Getting upset easily (irritability). Treating low blood sugar To treat low blood sugar, eat or drink something sugary right away. If you can think clearly and swallow safely, follow the 15:15 rule:  Take 15 grams of a fast-acting carb (carbohydrate). Talk with your doctor about how much you should take.  Some fast-acting carbs are: ? Sugar tablets (glucose pills). Take 3-4 glucose pills. ? 6-8 pieces of hard candy. ? 4-6 oz (120-150 mL) of fruit juice. ? 4-6 oz (120-150 mL) of regular (not diet) soda. ? 1 Tbsp (15 mL) honey or sugar.  Check your blood sugar 15 minutes after you take the carb.  If your blood sugar is still at or below 70 mg/dL (3.9 mmol/L), take 15 grams of a carb again.  If your blood sugar does not go above 70 mg/dL (3.9 mmol/L) after 3 tries, get help right away.  After your blood sugar goes back to normal, eat a meal or a snack within 1 hour. Treating very low blood sugar If your blood sugar is at or below 54 mg/dL (3 mmol/L), you have very low blood sugar (severe hypoglycemia). This is an emergency. Do not wait to see if the symptoms will go away. Get medical help right away. Call your local emergency services (911 in the [place] Do not drive yourself to the hospital. Questions to ask your health care provider  Do I need to meet with a diabetes educator?  What equipment will I need to care for myself at home?  What diabetes medicines do I need? When should I take them?  How often do I need to check my blood sugar?  What number can I call if I have questions?  When is my next doctor's visit?  Where can I find a support group for people with  diabetes? Where to find more information  American Diabetes Association: www.diabetes.org  American Association of Diabetes Educators: www.diabeteseducator.org/patient-resources Contact a doctor if:  Your blood sugar is at or above 240 mg/dL (13.3 mmol/L) for 2 days in a row.  You have been sick or have had a fever for 2 days or more, and you are not getting better.  You have any  of these problems for more than 6 hours: ? You cannot eat or drink. ? You feel sick to your stomach (nauseous). ? You throw up (vomit). ? You have watery poop (diarrhea). Get help right away if:  Your blood sugar is lower than 54 mg/dL (3 mmol/L).  You get confused.  You have trouble: ? Thinking clearly. ? Breathing. Summary  Diabetes (diabetes mellitus) is a long-term (chronic) disease. It occurs when the body does not properly use sugar (glucose) that is released from food after digestion.  Take insulin and diabetes medicines as told.  Check your blood sugar every day, as often as told.  Keep all follow-up visits as told by your doctor. This is important. This information is not intended to replace advice given to you by your health care provider. Make sure you discuss any questions you have with your health care provider. Document Revised: 08/01/2019 Document Reviewed: 02/10/2018 Elsevier Patient Education  [place].  Diabetes Mellitus and Exercise Exercising regularly is important for your overall health, especially when you have diabetes (diabetes mellitus). Exercising is not only about losing weight. It has many other health benefits, such as increasing muscle strength and bone density and reducing body fat and stress. This leads to improved fitness, flexibility, and endurance, all of which result in better overall health. Exercise has additional benefits for people with diabetes, including:  Reducing appetite.  Helping to lower and control blood glucose.  Lowering blood  pressure.  Helping to control amounts of fatty substances (lipids) in the blood, such as cholesterol and triglycerides.  Helping the body to respond better to insulin (improving insulin sensitivity).  Reducing how much insulin the body needs.  Decreasing the risk for heart disease by: ? Lowering cholesterol and triglyceride levels. ? Increasing the levels of good cholesterol. ? Lowering blood glucose levels. What is my activity plan? Your health care provider or certified diabetes educator can help you make a plan for the type and frequency of exercise (activity plan) that works for you. Make sure that you:  Do at least 150 minutes of moderate-intensity or vigorous-intensity exercise each week. This could be brisk walking, biking, or water aerobics. ? Do stretching and strength exercises, such as yoga or weightlifting, at least 2 times a week. ? Spread out your activity over at least 3 days of the week.  Get some form of physical activity every day. ? Do not go more than 2 days in a row without some kind of physical activity. ? Avoid being inactive for more than 30 minutes at a time. Take frequent breaks to walk or stretch.  Choose a type of exercise or activity that you enjoy, and set realistic goals.  Start slowly, and gradually increase the intensity of your exercise over time. What do I need to know about managing my diabetes?   Check your blood glucose before and after exercising. ? If your blood glucose is 240 mg/dL (13.3 mmol/L) or higher before you exercise, check your urine for ketones. If you have ketones in your urine, do not exercise until your blood glucose returns to normal. ? If your blood glucose is 100 mg/dL (5.6 mmol/L) or lower, eat a snack containing 15-20 grams of carbohydrate. Check your blood glucose 15 minutes after the snack to make sure that your level is above 100 mg/dL (5.6 mmol/L) before you start your exercise.  Know the symptoms of low blood glucose  (hypoglycemia) and how to treat it. Your risk for hypoglycemia increases during  and after exercise. Common symptoms of hypoglycemia can include: ? Hunger. ? Anxiety. ? Sweating and feeling clammy. ? Confusion. ? Dizziness or feeling light-headed. ? Increased heart rate or palpitations. ? Blurry vision. ? Tingling or numbness around the mouth, lips, or tongue. ? Tremors or shakes. ? Irritability.  Keep a rapid-acting carbohydrate snack available before, during, and after exercise to help prevent or treat hypoglycemia.  Avoid injecting insulin into areas of the body that are going to be exercised. For example, avoid injecting insulin into: ? The arms, when playing tennis. ? The legs, when jogging.  Keep records of your exercise habits. Doing this can help you and your health care provider adjust your diabetes management plan as needed. Write down: ? Food that you eat before and after you exercise. ? Blood glucose levels before and after you exercise. ? The type and amount of exercise you have done. ? When your insulin is expected to peak, if you use insulin. Avoid exercising at times when your insulin is peaking.  When you start a new exercise or activity, work with your health care provider to make sure the activity is safe for you, and to adjust your insulin, medicines, or food intake as needed.  Drink plenty of water while you exercise to prevent dehydration or heat stroke. Drink enough fluid to keep your urine clear or pale yellow. Summary  Exercising regularly is important for your overall health, especially when you have diabetes (diabetes mellitus).  Exercising has many health benefits, such as increasing muscle strength and bone density and reducing body fat and stress.  Your health care provider or certified diabetes educator can help you make a plan for the type and frequency of exercise (activity plan) that works for you.  When you start a new exercise or activity, work  with your health care provider to make sure the activity is safe for you, and to adjust your insulin, medicines, or food intake as needed. This information is not intended to replace advice given to you by your health care provider. Make sure you discuss any questions you have with your health care provider. Document Revised: 06/02/2017 Document Reviewed: 04/19/2016 Elsevier Patient Education  [place].

## 2020-09-20 LAB — HEMOGLOBIN A1C
Hgb A1c MFr Bld: 5.7 % of total Hgb — ABNORMAL HIGH (ref <5.7)
Mean Plasma Glucose: 117 (calc)
eAG (mmol/L): 6.5 (calc)

## 2020-09-20 LAB — CBC WITH DIFFERENTIAL/PLATELET
Absolute Monocytes: 660 cells/uL (ref 200–950)
Basophils Absolute: 56 cells/uL (ref 0–200)
Basophils Relative: 0.6 %
Eosinophils Absolute: 130 cells/uL (ref 15–500)
Eosinophils Relative: 1.4 %
HCT: 42 % (ref 35.0–45.0)
Hemoglobin: 13.9 g/dL (ref 11.7–15.5)
Lymphs Abs: 2148 cells/uL (ref 850–3900)
MCH: 29.3 pg (ref 27.0–33.0)
MCHC: 33.1 g/dL (ref 32.0–36.0)
MCV: 88.6 fL (ref 80.0–100.0)
MPV: 9.4 fL (ref 7.5–12.5)
Monocytes Relative: 7.1 %
Neutro Abs: 6305 cells/uL (ref 1500–7800)
Neutrophils Relative %: 67.8 %
Platelets: 288 10*3/uL (ref 140–400)
RBC: 4.74 10*6/uL (ref 3.80–5.10)
RDW: 12.6 % (ref 11.0–15.0)
Total Lymphocyte: 23.1 %
WBC: 9.3 10*3/uL (ref 3.8–10.8)

## 2020-09-20 LAB — COMPLETE METABOLIC PANEL WITH GFR
AG Ratio: 1.6 (calc) (ref 1.0–2.5)
ALT: 15 U/L (ref 6–29)
AST: 14 U/L (ref 10–35)
Albumin: 4.2 g/dL (ref 3.6–5.1)
Alkaline phosphatase (APISO): 67 U/L (ref 37–153)
BUN: 12 mg/dL (ref 7–25)
CO2: 23 mmol/L (ref 20–32)
Calcium: 8.8 mg/dL (ref 8.6–10.4)
Chloride: 103 mmol/L (ref 98–110)
Creat: 0.61 mg/dL (ref 0.50–1.05)
GFR, Est African American: 123 mL/min/{1.73_m2} (ref >=60)
GFR, Est Non African American: 106 mL/min/{1.73_m2} (ref >=60)
Globulin: 2.7 g/dL (calc) (ref 1.9–3.7)
Glucose, Bld: 139 mg/dL — ABNORMAL HIGH (ref 65–99)
Potassium: 4.2 mmol/L (ref 3.5–5.3)
Sodium: 136 mmol/L (ref 135–146)
Total Bilirubin: 0.5 mg/dL (ref 0.2–1.2)
Total Protein: 6.9 g/dL (ref 6.1–8.1)

## 2020-09-20 LAB — T4, FREE: Free T4: 1.1 ng/dL (ref 0.8–1.8)

## 2020-09-20 LAB — LIPID PANEL
Cholesterol: 244 mg/dL — ABNORMAL HIGH (ref <200)
HDL: 40 mg/dL — ABNORMAL LOW (ref >=50)
LDL Cholesterol (Calc): 171 mg/dL (calc) — ABNORMAL HIGH
Non-HDL Cholesterol (Calc): 204 mg/dL (calc) — ABNORMAL HIGH (ref <130)
Total CHOL/HDL Ratio: 6.1 (calc) — ABNORMAL HIGH (ref <5.0)
Triglycerides: 176 mg/dL — ABNORMAL HIGH (ref <150)

## 2020-09-20 LAB — TSH: TSH: 2.24 mIU/L

## 2020-09-22 ENCOUNTER — Encounter: Payer: Self-pay | Admitting: Family Medicine

## 2020-09-22 ENCOUNTER — Other Ambulatory Visit: Payer: Self-pay | Admitting: Neurology

## 2020-09-22 ENCOUNTER — Encounter: Payer: Self-pay | Admitting: Neurology

## 2020-09-23 ENCOUNTER — Ambulatory Visit: Payer: BC Managed Care – PPO | Admitting: Neurology

## 2020-09-23 ENCOUNTER — Encounter: Payer: Self-pay | Admitting: Neurology

## 2020-09-23 VITALS — BP 138/84 | HR 79 | Ht 65.0 in | Wt 262.5 lb

## 2020-09-23 DIAGNOSIS — G4713 Recurrent hypersomnia: Secondary | ICD-10-CM | POA: Insufficient documentation

## 2020-09-23 DIAGNOSIS — Z6841 Body Mass Index (BMI) 40.0 and over, adult: Secondary | ICD-10-CM

## 2020-09-23 DIAGNOSIS — R0689 Other abnormalities of breathing: Secondary | ICD-10-CM | POA: Insufficient documentation

## 2020-09-23 DIAGNOSIS — G4761 Periodic limb movement disorder: Secondary | ICD-10-CM | POA: Diagnosis not present

## 2020-09-23 DIAGNOSIS — F3340 Major depressive disorder, recurrent, in remission, unspecified: Secondary | ICD-10-CM | POA: Insufficient documentation

## 2020-09-23 NOTE — Patient Instructions (Signed)
Restless Legs Syndrome Restless legs syndrome is a condition that causes uncomfortable feelings or sensations in the legs, especially while sitting or lying down. The sensations usually cause an overwhelming urge to move the legs. The arms can also sometimes be affected. The condition can range from mild to severe. The symptoms often interfere with a person's ability to sleep. What are the causes? The cause of this condition is not known. What increases the risk? The following factors may make you more likely to develop this condition:  Being older than 50.  Pregnancy.  Being a woman. In general, the condition is more common in women than in men.  A family history of the condition.  Having iron deficiency.  Overuse of caffeine, nicotine, or alcohol.  Certain medical conditions, such as kidney disease, Parkinson's disease, or nerve damage.  Certain medicines, such as those for high blood pressure, nausea, colds, allergies, depression, and some heart conditions. What are the signs or symptoms? The main symptom of this condition is uncomfortable sensations in the legs, such as:  Pulling.  Tingling.  Prickling.  Throbbing.  Crawling.  Burning. Usually, the sensations:  Affect both sides of the body.  Are worse when you sit or lie down.  Are worse at night. These may wake you up or make it difficult to fall asleep.  Make you have a strong urge to move your legs.  Are temporarily relieved by moving your legs. The arms can also be affected, but this is rare. People who have this condition often have tiredness during the day because of their lack of sleep at night. How is this diagnosed? This condition may be diagnosed based on:  Your symptoms.  Blood tests. In some cases, you may be monitored in a sleep lab by a specialist (a sleep study). This can detect any disruptions in your sleep. How is this treated? This condition is treated by managing the  symptoms. This may include:  Lifestyle changes, such as exercising, using relaxation techniques, and avoiding caffeine, alcohol, or tobacco.  Medicines. Anti-seizure medicines may be tried first. Follow these instructions at home:     General instructions  Take over-the-counter and prescription medicines only as told by your health care provider.  Use methods to help relieve the uncomfortable sensations, such as: ? Massaging your legs. ? Walking or stretching. ? Taking a cold or hot bath.  Keep all follow-up visits as told by your health care provider. This is important. Lifestyle  Practice good sleep habits. For example, go to bed and get up at the same time every day. Most adults should get 7-9 hours of sleep each night.  Exercise regularly. Try to get at least 30 minutes of exercise most days of the week.  Practice ways of relaxing, such as yoga or meditation.  Avoid caffeine and alcohol.  Do not use any products that contain nicotine or tobacco, such as cigarettes and e-cigarettes. If you need help quitting, ask your health care provider. Contact a health care provider if:  Your symptoms get worse or they do not improve with treatment. Summary  Restless legs syndrome is a condition that causes uncomfortable feelings or sensations in the legs, especially while sitting or lying down.  The symptoms often interfere with a person's ability to sleep.  This condition is treated by managing the symptoms. You may need to make lifestyle changes or take medicines. This information is not intended to replace advice given to you by  your health care provider. Make sure you discuss any questions you have with your health care provider. Document Revised: 11/28/2017 Document Reviewed: 11/28/2017 Elsevier Patient Education  2020 [place] for Sleep Apnea  Sleep apnea is a condition in which breathing pauses or becomes shallow during sleep. Sleep apnea screening is a  test to determine if you are at risk for sleep apnea. The test is easy and only takes a few minutes. Your health care provider may ask you to have this test in preparation for surgery or as part of a physical exam. What are the symptoms of sleep apnea? Common symptoms of sleep apnea include:  Snoring.  Restless sleep.  Daytime sleepiness.  Pauses in breathing.  Choking during sleep.  Irritability.  Forgetfulness.  Trouble thinking clearly.  Depression.  Personality changes. Most people with sleep apnea are not aware that they have it. Why should I get screened? Getting screened for sleep apnea can help:  Ensure your safety. It is important for your health care providers to know whether or not you have sleep apnea, especially if you are having surgery or have other long-term (chronic) health conditions.  Improve your health and allow you to get a better night's rest. Restful sleep can help you: ? Have more energy. ? Lose weight. ? Improve high blood pressure. ? Improve diabetes management. ? Prevent stroke. ? Prevent car accidents. How is screening done? Screening usually includes being asked a list of questions about your sleep quality. Some questions you may be asked include:  Do you snore?  Is your sleep restless?  Do you have daytime sleepiness?  Has a partner or spouse told you that you stop breathing during sleep?  Have you had trouble concentrating or memory loss? If your screening test is positive, you are at risk for the condition. Further testing may be needed to confirm a diagnosis of sleep apnea. Where to find more information You can find screening tools online or at your health care clinic. For more information about sleep apnea screening and healthy sleep, visit these websites:  Centers for Disease Control and Prevention: [employer]  American Sleep Apnea Association: www.sleepapnea.org Contact a health care provider if:  You  think that you may have sleep apnea. Summary  Sleep apnea screening can help determine if you are at risk for sleep apnea.  It is important for your health care providers to know whether or not you have sleep apnea, especially if you are having surgery or have other chronic health conditions.  You may be asked to take a screening test for sleep apnea in preparation for surgery or as part of a physical exam. This information is not intended to replace advice given to you by your health care provider. Make sure you discuss any questions you have with your health care provider. Document Revised: 08/25/2018 Document Reviewed: 02/18/2017 Elsevier Patient Education  [place]. Insomnia Insomnia is a sleep disorder that makes it difficult to fall asleep or stay asleep. Insomnia can cause fatigue, low energy, difficulty concentrating, mood swings, and poor performance at work or school. There are three different ways to classify insomnia:  Difficulty falling asleep.  Difficulty staying asleep.  Waking up too early in the morning. Any type of insomnia can be long-term (chronic) or short-term (acute). Both are common. Short-term insomnia usually lasts for three months or less. Chronic insomnia occurs at least three times a week for longer than three months. What are the causes? Insomnia may be  caused by another condition, situation, or substance, such as:  Anxiety.  Certain medicines.  Gastroesophageal reflux disease (GERD) or other gastrointestinal conditions.  Asthma or other breathing conditions.  Restless legs syndrome, sleep apnea, or other sleep disorders.  Chronic pain.  Menopause.  Stroke.  Abuse of alcohol, tobacco, or illegal drugs.  Mental health conditions, such as depression.  Caffeine.  Neurological disorders, such as Alzheimer's disease.  An overactive thyroid (hyperthyroidism). Sometimes, the cause of insomnia may not be known. What increases the  risk? Risk factors for insomnia include:  Gender. Women are affected more often than men.  Age. Insomnia is more common as you get older.  Stress.  Lack of exercise.  Irregular work schedule or working night shifts.  Traveling between different time zones.  Certain medical and mental health conditions. What are the signs or symptoms? If you have insomnia, the main symptom is having trouble falling asleep or having trouble staying asleep. This may lead to other symptoms, such as:  Feeling fatigued or having low energy.  Feeling nervous about going to sleep.  Not feeling rested in the morning.  Having trouble concentrating.  Feeling irritable, anxious, or depressed. How is this diagnosed? This condition may be diagnosed based on:  Your symptoms and medical history. Your health care provider may ask about: ? Your sleep habits. ? Any medical conditions you have. ? Your mental health.  A physical exam. How is this treated? Treatment for insomnia depends on the cause. Treatment may focus on treating an underlying condition that is causing insomnia. Treatment may also include:  Medicines to help you sleep.  Counseling or therapy.  Lifestyle adjustments to help you sleep better. Follow these instructions at home: Eating and drinking   Limit or avoid alcohol, caffeinated beverages, and cigarettes, especially close to bedtime. These can disrupt your sleep.  Do not eat a large meal or eat spicy foods right before bedtime. This can lead to digestive discomfort that can make it hard for you to sleep. Sleep habits   Keep a sleep diary to help you and your health care provider figure out what could be causing your insomnia. Write down: ? When you sleep. ? When you wake up during the night. ? How well you sleep. ? How rested you feel the next day. ? Any side effects of medicines you are taking. ? What you eat and drink.  Make your bedroom a dark, comfortable place where  it is easy to fall asleep. ? Put up shades or blackout curtains to block light from outside. ? Use a white noise machine to block noise. ? Keep the temperature cool.  Limit screen use before bedtime. This includes: ? Watching TV. ? Using your smartphone, tablet, or computer.  Stick to a routine that includes going to bed and waking up at the same times every day and night. This can help you fall asleep faster. Consider making a quiet activity, such as reading, part of your nighttime routine.  Try to avoid taking naps during the day so that you sleep better at night.  Get out of bed if you are still awake after 15 minutes of trying to sleep. Keep the lights down, but try reading or doing a quiet activity. When you feel sleepy, go back to bed. General instructions  Take over-the-counter and prescription medicines only as told by your health care provider.  Exercise regularly, as told by your health care provider. Avoid exercise starting several hours before bedtime.  Use  relaxation techniques to manage stress. Ask your health care provider to suggest some techniques that may work well for you. These may include: ? Breathing exercises. ? Routines to release muscle tension. ? Visualizing peaceful scenes.  Make sure that you drive carefully. Avoid driving if you feel very sleepy.  Keep all follow-up visits as told by your health care provider. This is important. Contact a health care provider if:  You are tired throughout the day.  You have trouble in your daily routine due to sleepiness.  You continue to have sleep problems, or your sleep problems get worse. Get help right away if:  You have serious thoughts about hurting yourself or someone else. If you ever feel like you may hurt yourself or others, or have thoughts about taking your own life, get help right away. You can go to your nearest emergency department or call:  Your local emergency services (911 in the [place]  A  suicide crisis helpline, such as the [place] at (415)850-4277. This is open 24 hours a day. Summary  Insomnia is a sleep disorder that makes it difficult to fall asleep or stay asleep.  Insomnia can be long-term (chronic) or short-term (acute).  Treatment for insomnia depends on the cause. Treatment may focus on treating an underlying condition that is causing insomnia.  Keep a sleep diary to help you and your health care provider figure out what could be causing your insomnia. This information is not intended to replace advice given to you by your health care provider. Make sure you discuss any questions you have with your health care provider. Document Revised: 10/21/2017 Document Reviewed: 08/18/2017 Elsevier Patient Education  2020 [employer].

## 2020-09-23 NOTE — Progress Notes (Signed)
SLEEP MEDICINE CLINIC    Provider:  Larey Seat, MD  Primary Care Physician:  Billie Ruddy, MD [place] [address]     Referring Provider: Billie Ruddy, [name],  [place] [address]          Chief Complaint according to patient   Patient presents with:    . New Patient (Initial Visit)           HISTORY OF PRESENT ILLNESS:  Gloria [name] is a 49 year- old  Caucasian female patient of Dr.Willis' and seen here upon his and NP's request  on 09/23/2020 for a sleep consultation.   Chief concern according to patient :  " I am chronically fatigued but also a recovering alcoholic, and my last sleep study was about 12 years ago- and I don't know how my baseline has changed since".      I have the pleasure of seeing Gloria [name] today, a right-handed White or Caucasian female with a possible sleep disorder.  She   has a past medical history of Allergy, Anemia, Anxiety, Arthritis, Bulging disc, Chronic active hepatitis ([place]), Chronic back pain (02/25/2015), Chronic headaches, Chronic kidney disease, Depression, Diverticulosis, Elevated LFTs, Fibromyalgia, GERD (gastroesophageal reflux disease), History of ETOH abuse, Hyperlipidemia, Hyperplastic colon polyp, Internal hemorrhoids, NAFLD (nonalcoholic fatty liver disease), Obese, Obstructive sleep apnea (02/25/2015), Osteoarthritis, Periodic limb movement disorder (02/25/2014), Pre-diabetes, RLS (restless legs syndrome), Sleep apnea, Smoker, and Substance abuse ([place]).   The patient had the first sleep study in the year 2007 . Sleep relevant medical history:  Recovering alcoholic a with sleep walking in childhood,   no tonsillectomy, had cervical spine surgery/ anterior access, Dr Joya Salm.    Family medical /sleep history:  father had RLS, alcoholic, he passed I  [id number], brother with OSA, mother with Insomnia..    Social history:  Patient is working in Press photographer / at  [employer] and lives in a household alone.  Family status is single  The patient currently works daytime.  Her dog present. Tobacco use- no.  ETOH use - not in years. , Caffeine intake in form of Coffee( 2 cups ) . Regular exercise in form of walking her dog     Sleep habits are as follows: The patient's dinner time is between 6-7 PM. The patient goes to bed at 10.15 PM and continues to sleep for several hours -wakes from gasping if she is in supine position-  No RLS.  The preferred sleep position is laterally, with the support of several  pillows.  Dreams are reportedly frequent.  6.15 AM is the usual rise time. The patient wakes up with an alarm.   She reports not feeling refreshed or restored in AM, with symptoms such as dry mouth , morning headaches, soreness and achiness, and residual fatigue. Naps are taken on weekends frequently, lasting from 2-2.5 hours and are morerefreshing than nocturnal sleep.  These don't interfere with nocturnal sleep.    I reviewed the patient's most recent lab tests through her primary care provider Morton Amy.  Hb A1c has been reduced to 5.6, her TSH is normal T4 is normal lipid panel shows hyperlipidemia hypercholesterolemia, INR was 1.0 and ALT AST were normal.    Review of Systems: Out of a complete 14 system review, the patient complains of only the following symptoms, and all other reviewed systems are negative.:  Fatigue, sleepiness , snoring,  Apnea in supine sleep, fragmented sleep,  Cyclic sleepiness, can't get out of bed, has lost jobs.    How likely are you to doze in the following situations: 0 = not likely, 1 = slight chance, 2 = moderate chance, 3 = high chance   Sitting and Reading? Watching Television? Sitting inactive in a public place (theater or meeting)? As a passenger in a car for an hour without a break? Lying down in the afternoon when circumstances permit? Sitting and talking to someone? Sitting quietly after lunch without  alcohol? In a car, while stopped for a few minutes in traffic?   Total = 10/ 24 points   FSS endorsed at 51/ 63 points.  Depression - yes. Overwhelmed, behind the ball, racing.    Social History   Socioeconomic History  . Marital status: Divorced    Spouse name: Not on file  . Number of children: 1  . Years of education: 52  . Highest education level: Not on file  Occupational History  . Occupation: Child psychotherapist: [employer]  Tobacco Use  . Smoking status: Former Smoker    Packs/day: 1.00    Quit date: 01/26/2012    Years since quitting: 8.6  . Smokeless tobacco: Never Used  Vaping Use  . Vaping Use: Never used  Substance and Sexual Activity  . Alcohol use: No    Alcohol/week: 0.0 standard drinks    Comment: History of alcohol abuse  . Drug use: No  . Sexual activity: Not Currently  Other Topics Concern  . Not on file  Social History Narrative   Patient lives at home and works full time.   Education some college.   Right handed.   Caffeine: 2 cups daily.   Social Determinants of Health   Financial Resource Strain:   . Difficulty of Paying Living Expenses: Not on file  Food Insecurity:   . Worried About Charity fundraiser in the Last Year: Not on file  . Ran Out of Food in the Last Year: Not on file  Transportation Needs:   . Lack of Transportation (Medical): Not on file  . Lack of Transportation (Non-Medical): Not on file  Physical Activity:   . Days of Exercise per Week: Not on file  . Minutes of Exercise per Session: Not on file  Stress:   . Feeling of Stress : Not on file  Social Connections:   . Frequency of Communication with Friends and Family: Not on file  . Frequency of Social Gatherings with Friends and Family: Not on file  . Attends Religious Services: Not on file  . Active Member of Clubs or Organizations: Not on file  . Attends Archivist Meetings: Not on file  . Marital Status: Not on file    Family  History  Problem Relation Age of Onset  . Breast cancer Mother   . Colon polyps Father   . Diabetes Father   . Cirrhosis Father   . Liver cancer Father   . Liver disease Father   . Irritable bowel syndrome Sister   . Breast cancer Maternal Aunt   . Breast cancer Paternal Grandmother   . Colon cancer Paternal Grandmother   . Esophageal cancer Paternal Grandfather   . Stomach cancer Paternal Grandfather   . Rectal cancer Neg Hx     Past Medical History:  Diagnosis Date  . Allergy   . Anemia    hx of- prior to hysterectomy  . Anxiety   . Arthritis   .  Bulging disc    X 2  . Chronic active hepatitis ([place])   . Chronic back pain 02/25/2015  . Chronic headaches   . Chronic kidney disease    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  . Depression   . Diverticulosis   . Elevated LFTs   . Fibromyalgia   . GERD (gastroesophageal reflux disease)   . History of ETOH abuse   . Hyperlipidemia   . Hyperplastic colon polyp   . Internal hemorrhoids   . NAFLD (nonalcoholic fatty liver disease)   . Obese   . Obstructive sleep apnea 02/25/2015   not diagnosed per pt 04-03-20  . Osteoarthritis    lower back- DDD  . Periodic limb movement disorder 02/25/2014  . Pre-diabetes    pt taking metformin  . RLS (restless legs syndrome)   . Sleep apnea   . Smoker   . Substance abuse [facility])     Past Surgical History:  Procedure Laterality Date  . CERVICAL FUSION    . COLONOSCOPY    . CYSTOSCOPY  06/06/2013   Procedure: CYSTOSCOPY;  Surgeon: Terrance Mass, MD;  Location: [facility];  Service: Gynecology;;  . INTRAUTERINE DEVICE INSERTION  08/09/2008   PARAGUARD- removed  . kidney surgery  1995   abcess  . LAPAROSCOPY N/A 06/06/2013   Procedure: LAPAROSCOPY DIAGNOSTIC;  Surgeon: Terrance Mass, MD;  Location: [facility];  Service: Gynecology;  Laterality: N/A;  . LAPAROTOMY  06/06/2013   Procedure: EXPLORATORY LAPAROTOMY;  Surgeon: Terrance Mass, MD;  Location: [facility];  Service: Gynecology;;  . LIVER  BIOPSY    . radial tunnel Bilateral 2005  . VAGINAL HYSTERECTOMY Left 06/06/2013   Procedure: Vaginal Hysterectomy with Patiial Right Salpingectomy;  Surgeon: Terrance Mass, MD;  Location: [facility];  Service: Gynecology;  Laterality: Left;     Current Outpatient Medications on File Prior to Visit  Medication Sig Dispense Refill  . B Complex Vitamins (B COMPLEX PO) Take 1 tablet by mouth daily.    . blood glucose meter kit and supplies KIT Dispense based on patient and insurance preference. Use up to four times daily as directed. (FOR ICD-9 250.00, 250.01). 1 each 0  . Blood Glucose Monitoring Suppl ([place]) w/Device KIT Use to check Blood sugars twice daily before meals 1 kit 0  . Calcium Carbonate-Vitamin D (CALCIUM 600/VITAMIN D) 600-400 MG-UNIT per tablet Take 1 tablet by mouth 2 (two) times daily.     . Carbidopa-Levodopa ER (SINEMET CR) 25-100 MG tablet controlled release Take 1 tablet by mouth at bedtime. 90 tablet 3  . clotrimazole-betamethasone (LOTRISONE) cream Apply 1 application topically 2 (two) times daily. For 10 days maximum 45 g 1  . Coenzyme Q10 (COQ10) 100 MG CAPS Take 100 mg by mouth daily.    [name] EQUETRO 200 MG CP12 12 hr capsule     . estradiol (CLIMARA - DOSED IN MG/24 HR) 0.05 mg/24hr patch APPLY 1 PATCH TOPICALLY TO  SKIN ONCE A WEEK 12 patch 0  . glucose blood (CONTOUR NEXT TEST) test strip Use to test blood glucose twice daily 100 each 5  . ibuprofen (ADVIL) 800 MG tablet TAKE 1 TABLET(800 MG) BY MOUTH EVERY 8 HOURS AS NEEDED FOR MODERATE PAIN 30 tablet 3  . Lancets (ONETOUCH DELICA PLUS [id number]) MISC USE 1  TO CHECK GLUCOSE 4 TIMES DAILY 100 each 0  . loratadine (CLARITIN) 10 MG tablet Take 10 mg by mouth daily.    [name] LORazepam (ATIVAN) 1  MG tablet Take 1 mg by mouth at bedtime.     . meclizine (ANTIVERT) 25 MG tablet Take 1 tablet (25 mg total) by mouth 3 (three) times daily as needed for dizziness. 30 tablet 0  . metroNIDAZOLE (METROCREAM) 0.75 %  cream daily    . Multiple Vitamins-Minerals (MULTIVITAMIN PO) Take 1 tablet by mouth daily.     [name] nystatin (MYCOSTATIN/NYSTOP) powder Apply topically 2 (two) times daily. 15 g 2  . OMEGA 3-6-9 FATTY ACIDS PO Take 500 mg by mouth daily.    . pantoprazole (PROTONIX) 40 MG tablet TAKE 1 TABLET BY MOUTH DAILY 30 tablet 1  . PROAIR HFA 108 (90 BASE) MCG/ACT inhaler Inhale 1 puff into the lungs every 4 (four) hours as needed for wheezing or shortness of breath.     [name] REXULTI 3 MG TABS Take 1 tablet by mouth daily.  1  . rizatriptan (MAXALT) 10 MG tablet Take 1 tablet (10 mg total) by mouth as needed for migraine. May repeat in 2 hours if needed 10 tablet 5  . thiamine (VITAMIN B-1) 50 MG tablet Take 50 mg by mouth daily.    . vitamin C (ASCORBIC ACID) 500 MG tablet Take 500 mg by mouth daily.    . Vitamin D, Ergocalciferol, (DRISDOL) 1.25 MG (50000 UT) CAPS capsule Take 50,000 Units by mouth every 7 (seven) days.    [name] VITAMIN E PO Take by mouth daily.     Current Facility-Administered Medications on File Prior to Visit  Medication Dose Route Frequency Provider Last Rate Last Admin  . 0.9 %  sodium chloride infusion  500 mL Intravenous Once Pyrtle, Lajuan Lines, MD        Allergies  Allergen Reactions  . Ropinirole Other (See Comments)  . Benadryl [Diphenhydramine Hcl] Hives  . Levofloxacin Other (See Comments)  . Aspirin   . Codeine   . Doxycycline   . Metformin And Related     Blurred vision  . Neosporin [Bacitracin-Polymyxin B]   . Nuvigil [Armodafinil]     migraines   . Requip [Ropinirole Hcl]     Increased headache  . Semaglutide Other (See Comments)    Rybelsus caused depression and fibromyalgia flair.  . Wellbutrin [Bupropion]   . Zolpidem     Physical exam:  Today's Vitals   09/23/20 1516  BP: 138/84  Pulse: 79  Weight: 262 lb 8 oz (119.1 kg)  Height: 5' 5"  (1.651 m)   Body mass index is 43.68 kg/m.   Wt Readings from Last 3 Encounters:  09/23/20 262 lb 8 oz (119.1 kg)    09/19/20 263 lb 12.8 oz (119.7 kg)  07/31/20 271 lb (122.9 kg)     Ht Readings from Last 3 Encounters:  09/23/20 5' 5"  (1.651 m)  07/21/20 5' 4.5" (1.638 m)  05/15/20 5' 5"  (1.651 m)      General: The patient is awake, alert and appears not in acute distress. The patient is well groomed. Head: Normocephalic, atraumatic. Neck is supple.  Mallampati 3 plus  ,  Retrognathia.  neck circumference:16.5  inches . Nasal airflow congested- each night.  Hinton Dyer is seen.  Dental status:  Cardiovascular:  Regular rate and cardiac rhythm by pulse,  without distended neck veins. Respiratory: Lungs are clear to auscultation.  Skin:  Without evidence of ankle edema, or rash. Trunk: The patient's posture is erect.   Neurologic exam : The patient is awake and alert, oriented to place and time.  Memory subjective described as intact.  Attention span & concentration ability appears normal.  Speech is fluent,  without  dysarthria, dysphonia or aphasia.  Mood and affect are appropriate.   Cranial nerves: no loss of smell or taste reported  Pupils are equal and briskly reactive to light. Funduscopic exam deferred. .  Extraocular movements in vertical and horizontal planes were intact and without nystagmus. No Diplopia. Visual fields by finger perimetry are intact. Hearing was intact to soft voice and finger rubbing.    Facial sensation intact to fine touch.  Facial motor strength is symmetric and tongue and uvula move midline.  Neck ROM : rotation, tilt and flexion extension were normal for age and shoulder shrug was symmetrical.    Motor exam:  Symmetric bulk, tone and ROM.   Normal tone without cog wheeling, symmetric grip strength . High arched feet , no hammertoes.  Sensory:  Fine touch, pinprick and vibration were normal.  Toes with pin and periodically needles -  Proprioception tested in the upper extremities was normal.   Coordination: Rapid alternating movements in the  fingers/hands were of normal speed.  The Finger-to-nose maneuver was intact without evidence of ataxia, dysmetria or tremor.   Gait and station: Patient could rise unassisted from a seated position, walked without assistive device.  Stance is of normal width/ base and the patient turned with 3 steps.  Toe and heel walk were deferred.  Deep tendon reflexes: in the  upper and lower extremities are symmetric and intact.  Babinski response was down going.     After spending a total time of  35  minutes face to face and additional time for physical and neurologic examination, review of laboratory studies,  personal review of imaging studies, reports and results of other testing and review of referral information / records as far as provided in visit, I have established the following assessments:  1) Mrs. Yarrow presents with a history of gasping for air and snoring when she ends up in supine sleep position,   2) she also has chronic insomnia and needs a low-dose of Xanax to initiate sleep or otherwise doesn't sleep at all.   3)  There is a history of cyclic hypersomnia every 3 or 4 weeks there will be a period of a couple of days when she cannot get out of bed and sleeps nonstop.   4)  In addition she was diagnosed as having restless legs but this took place very close to the time when she became sober and in the last 12 years her underlying history and medical history has significantly changed.  She has not tried to not take her restless leg medication so I will ask her to try and don't sleep without it.    It is possible that there are no restless legs anymore because she has been well replenished her vitamin B folic acid and iron reservoirs and other deficiencies that may have accompanied her life while she was drinking.    The risk factors for obstructive sleep apnea are still there a BMI of currently 43, and very narrow upper airway Mallampati grade 3 and an retrognathia a small lower jaw with  crowded teeth and functional macroglossia.   My Plan is to proceed with:  1)  Repeat an attended sleep study - I ned to [place] again at apnea, the patient is invited to take her sleep aid at th lab. But we need an attended sleep study to screen for PLMs, given her  history of  RLS.  If BCBS will not permit this study I will ask her to have a HST and return in 3 month with NP, Butler Denmark for follow up.    I would like to thank Billie Ruddy, MD and Billie Ruddy, Md [address] [place],  [place] [address] for allowing me to meet with and to take care of this pleasant patient.     Electronically signed by: Larey Seat, MD 09/23/2020 3:42 PM  [facility] Board certified by The [employer] of Sleep Medicine and Diplomate of the [employer] of Sleep Medicine. Board certified In Neurology through the [place], Fellow of the [employer] of Neurology. Medical Director of [facility].

## 2020-09-30 ENCOUNTER — Telehealth: Payer: Self-pay

## 2020-09-30 NOTE — Telephone Encounter (Signed)
LVM for pt to call me back to schedule sleep study  

## 2020-10-15 ENCOUNTER — Ambulatory Visit (INDEPENDENT_AMBULATORY_CARE_PROVIDER_SITE_OTHER): Payer: BC Managed Care – PPO | Admitting: Neurology

## 2020-10-15 ENCOUNTER — Other Ambulatory Visit: Payer: Self-pay

## 2020-10-15 DIAGNOSIS — G4761 Periodic limb movement disorder: Secondary | ICD-10-CM

## 2020-10-15 DIAGNOSIS — R0689 Other abnormalities of breathing: Secondary | ICD-10-CM

## 2020-10-15 DIAGNOSIS — G4713 Recurrent hypersomnia: Secondary | ICD-10-CM

## 2020-10-15 DIAGNOSIS — F3340 Major depressive disorder, recurrent, in remission, unspecified: Secondary | ICD-10-CM

## 2020-10-15 DIAGNOSIS — G478 Other sleep disorders: Secondary | ICD-10-CM

## 2020-10-25 DIAGNOSIS — G478 Other sleep disorders: Secondary | ICD-10-CM | POA: Insufficient documentation

## 2020-10-25 NOTE — Addendum Note (Signed)
Addended by: Larey Seat on: 10/25/2020 06:38 PM   Modules accepted: Orders

## 2020-10-25 NOTE — Progress Notes (Signed)
IMPRESSION:  1. This is very mild Obstructive Sleep Apnea (OSA) at an AHI of only 5.3/h but there is a significant number of snoring related arousals RERAS, reflected in the RDI. I would qualify this as mild OSA with UARS. 2. This apnea is supine sleep position dependent (as is Upper Airway Resistance syndrome), there was no AHI in non-supine sleep! the supine AHI was 10.3/h 3. Apnea is also REM sleep dependent. REM AHI was 40.9 /hour.  4. No significant oxygen desaturation was noted during sleep.  5. There was no evidence of Periodic Limb Movement Disorder (PLMD), and there were no periodic limb movements before sleep onset as usually seen in RLS. This can mean the condition is well controlled under medication or is no longer present.   6. Primary Snoring was dependent on supine position.    RECOMMENDATIONS: 1) This apnea will respond best to completely avoiding supine sleep. Due to DDD and osteoarthritis related discomfort this may not be a possibility for this patient.  2) If this positional change cannot be achieved, then we need to use CPAP indeed.    I will be happy to order a new autotitration device, or to reset the current CPAP (dependent on the current CPAP machine's age). The patient shall use a pressure of 6-12 cm water, 1 cm EPR and a mask of her choice with heated humidification.   PS: A dental device can effectively address only NREM sleep apnea and snoring (RERA) - in this case only a small fraction of the respiratory related arousals was seen in NREM sleep.

## 2020-10-25 NOTE — Procedures (Signed)
PATIENT'S NAME:  Gloria [name], Gloria [name] DOB:      12-18-1970      MR#:    [id number]     DATE OF RECORDING: 10/15/2020  M. Ronni Rumble M.D.:  Grier Mitts, MD Study Performed:   Polysomnogram with PLM montage  HISTORY:  I met on 09-23-2020 with [name], a right-handed Caucasian female with a possible sleep disorder. She was referred for a sleep consultation. She has a medical history of Allergies, Anemia, Anxiety, Osteoarthritis, DDD, back and joint pain, myalgia, Chronic active hepatitis ([place]), Chronic kidney disease, Depression, Diverticulosis, Liver disease with Elevated LFTs in the past, elevated INR in the past, GERD with varices (gastroesophageal reflux disease), History of Hyperlipidemia, Internal hemorrhoids, ASH (fatty liver disease), Morbid Obesity Grade 3, a diagnosis of Obstructive sleep apnea last sleep test was 12-15 years ago (02/25/2015), Periodic limb movement disorder (02/25/2014), RLS (restless legs syndrome), Smoking, and remote history of ETOH abuse.  She has a small mouth, retrognathia and large tongue.  [name] reported long standing trouble to go to sleep and to stay asleep, non- restorative sleep, taking daytime naps, and having Depression- used to have cyclic sleep disorder. She had been diagnosed with PLMs and with OSA when she still drank, about 15 years ago.  The patient is followed by Gloria Denmark NP/ Floyde Parkins, MD.  The patient endorsed the [place]worth Sleepiness Scale at 10 points.   The patient's weight 262 pounds with a height of 65 (inches), resulting in a BMI of 43.7 kg/m2. The patient's neck circumference measured 16.5 inches.  CURRENT MEDICATIONS: Sinemet, Lotrisone, CoQ10, Equestro, Climera, Advil, Claritin, Ativan, Antivert, Multivitamins, Omega 3, Protonix, ProAir HFA, Rexulti, Maxalt, Vitamin B12, Vitamin C, Vitamin E, Vitamin B, Vitamin D.   PROCEDURE:  This is a multichannel digital polysomnogram utilizing the Somnostar 11.2 system.   Electrodes and sensors were applied and monitored per AASM Specifications.   EEG, EOG, Chin and Limb EMG, were sampled at 200 Hz.  ECG, Snore and Nasal Pressure, Thermal Airflow, Respiratory Effort, CPAP Flow and Pressure, Oximetry was sampled at 50 Hz. Digital video and audio were recorded.      BASELINE STUDY: Lights Out was at 22:04 and Lights On at 04:57.  Total recording time (TRT) was 414 minutes, with a total sleep time (TST) of 342 minutes.   The patient's sleep latency was 62.5 minutes.  REM latency was 205.5 minutes.  The sleep efficiency was 82.6 %.     SLEEP ARCHITECTURE: WASO (Wake after sleep onset) was 56 minutes.  There were 25 minutes in Stage N1, 233.5 minutes Stage N2, 60 minutes Stage N3 and 23.5 minutes in Stage REM.  The percentage of Stage N1 was 7.3%, Stage N2 was 68.3%, Stage N3 was 17.5% and Stage R (REM sleep) was 6.9%.   RESPIRATORY ANALYSIS:  There were a total of 30 respiratory events:  9 obstructive apneas, 0 central apneas and 1 mixed apnea with a total of 10 apneas and an apnea index (AI) of 1.8 /hour. There were 20 hypopneas with a hypopnea index of 3.5 /hour. The patient also had plenty of respiratory event related arousals (RERAs).      The total APNEA/HYPOPNEA INDEX (AHI) was 5.3/hour and the total RESPIRATORY DISTURBANCE INDEX was 13.5 /hour. 16 events occurred in REM sleep and 18 events in NREM.  The REM AHI was 40.9 /hour, versus a non-REM AHI of 2.6.  The patient spent 174 minutes of total sleep time in the supine position and  168 minutes in non-supine.  The supine AHI was 10.3/h versus a non-supine AHI of 0.0/h.  OXYGEN SATURATION & C02:  The Wake baseline 02 saturation was 96%, with the lowest being 86%. Time spent below 89% saturation equaled 4 minutes. The patient had a total of 0 Periodic Limb Movements.   Audio and video analysis did not show any abnormal or unusual movements, behaviors, phonations or vocalizations.  Moderate- Loud Snoring was noted in  supine sleep and caused arousals. EKG was in keeping with normal sinus rhythm (NSR).  IMPRESSION:  1. This is very mild Obstructive Sleep Apnea (OSA) at an AHI of only 5.3/h but there is a significant number of snoring related arousals RERAS, reflected in the RDI. I would qualify this as mild OSA with UARS. 2. This apnea is supine sleep position dependent (as is Upper Airway Resistance syndrome), there was no AHI in non-supine sleep!  3. Apnea is also REM sleep dependent. REM AHI was 40.9 /hour.  4. No significant oxygen desaturation was noted during sleep.  5. There was no evidence of Periodic Limb Movement Disorder (PLMD), and there were no periodic limb movements before sleep onset as usually seen in RLS.  6. Primary Snoring was dependent on supine position.    RECOMMENDATIONS: 1) This apnea will respond best to completely avoiding supine sleep. This may due to DDD and osteoarthritis related discomfort not be a possibility for this patient.  2)If this cannot be achieved, then we need to use CPAP indeed.   I will be happy to order a new autotitration device, or to reset the current CPAP (dependent on the current CPAP machine's age). The patient shall use a pressure of 6-12 cm water, 1 cm EPR and a mask of her choice with heated humidification.  PS: A dental device can effectively address only NREM sleep apnea and snoring (RERA) - in this case a small fraction of the respiratory related arousals was seen in NREM sleep.     I certify that I have reviewed the entire raw data recording prior to the issuance of this report in accordance with the Standards of Accreditation of the [employer] (AASM)  Larey Seat, MD Medical Director, [facility], [place] of Neurology and Sleep Medicine (Neurology and Sleep Medicine)

## 2020-10-27 ENCOUNTER — Telehealth: Payer: Self-pay | Admitting: Neurology

## 2020-10-27 NOTE — Telephone Encounter (Signed)
-----   Message from Larey Seat, MD sent at 10/25/2020  6:38 PM EST ----- IMPRESSION:  1. This is very mild Obstructive Sleep Apnea (OSA) at an AHI of only 5.3/h but there is a significant number of snoring related arousals RERAS, reflected in the RDI. I would qualify this as mild OSA with UARS. 2. This apnea is supine sleep position dependent (as is Upper Airway Resistance syndrome), there was no AHI in non-supine sleep! the supine AHI was 10.3/h 3. Apnea is also REM sleep dependent. REM AHI was 40.9 /hour.  4. No significant oxygen desaturation was noted during sleep.  5. There was no evidence of Periodic Limb Movement Disorder (PLMD), and there were no periodic limb movements before sleep onset as usually seen in RLS. This can mean the condition is well controlled under medication or is no longer present.   6. Primary Snoring was dependent on supine position.    RECOMMENDATIONS: 1) This apnea will respond best to completely avoiding supine sleep. Due to DDD and osteoarthritis related discomfort this may not be a possibility for this patient.  2) If this positional change cannot be achieved, then we need to use CPAP indeed.    I will be happy to order a new autotitration device, or to reset the current CPAP (dependent on the current CPAP machine's age). The patient shall use a pressure of 6-12 cm water, 1 cm EPR and a mask of her choice with heated humidification.   PS: A dental device can effectively address only NREM sleep apnea and snoring (RERA) - in this case only a small fraction of the respiratory related arousals was seen in NREM sleep.

## 2020-10-27 NOTE — Telephone Encounter (Signed)
I called pt. I advised pt that Dr. Brett Fairy reviewed their sleep study results and found that pt has mild sleep apnea. Dr. Brett Fairy recommends that pt starts auto CPAP. I reviewed PAP compliance expectations with the pt. Pt is agreeable to starting a CPAP. I advised pt that an order will be sent to a DME, [facility] and [facility] will call the pt within about one week after they file with the pt's insurance. [facility]) will show the pt how to use the machine, fit for masks, and troubleshoot the CPAP if needed. A follow up appt will need to be made for insurance purposes with Dr. Brett Fairy or NP. Pt verbalized understanding to call and schedule the initial CPAP visit. A letter with all of this information in it will be sent to the pt as a reminder. Pt verbalized understanding of results. Pt had no questions at this time but was encouraged to call back if questions arise. I have sent the order to [facility])  and have received confirmation that they have received the order.

## 2020-11-03 ENCOUNTER — Ambulatory Visit: Payer: BC Managed Care – PPO | Admitting: Obstetrics & Gynecology

## 2020-11-03 ENCOUNTER — Encounter: Payer: Self-pay | Admitting: Obstetrics & Gynecology

## 2020-11-03 ENCOUNTER — Other Ambulatory Visit: Payer: Self-pay

## 2020-11-03 VITALS — BP 130/84 | Ht 64.0 in | Wt 254.0 lb

## 2020-11-03 DIAGNOSIS — Z79890 Hormone replacement therapy (postmenopausal): Secondary | ICD-10-CM

## 2020-11-03 DIAGNOSIS — Z01419 Encounter for gynecological examination (general) (routine) without abnormal findings: Secondary | ICD-10-CM

## 2020-11-03 DIAGNOSIS — Z6841 Body Mass Index (BMI) 40.0 and over, adult: Secondary | ICD-10-CM

## 2020-11-03 DIAGNOSIS — Z90710 Acquired absence of both cervix and uterus: Secondary | ICD-10-CM | POA: Diagnosis not present

## 2020-11-03 DIAGNOSIS — B372 Candidiasis of skin and nail: Secondary | ICD-10-CM | POA: Diagnosis not present

## 2020-11-03 MED ORDER — ESTRADIOL 0.05 MG/24HR TD PTTW: 1.0000 | MEDICATED_PATCH | TRANSDERMAL | 4 refills | Status: DC
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
## 2020-11-20 ENCOUNTER — Ambulatory Visit: Payer: No Typology Code available for payment source | Admitting: Psychiatry

## 2020-11-27 ENCOUNTER — Ambulatory Visit: Payer: No Typology Code available for payment source | Admitting: Psychiatry

## 2020-11-27 ENCOUNTER — Encounter: Payer: Self-pay | Admitting: Neurology

## 2020-12-02 ENCOUNTER — Other Ambulatory Visit: Payer: Self-pay | Admitting: Family Medicine

## 2020-12-03 ENCOUNTER — Encounter: Payer: Self-pay | Admitting: Family Medicine

## 2020-12-09 ENCOUNTER — Telehealth: Payer: Self-pay

## 2020-12-09 ENCOUNTER — Other Ambulatory Visit: Payer: Self-pay

## 2020-12-09 MED ORDER — GLIPIZIDE 5 MG PO TABS: 5.0000 mg | ORAL_TABLET | Freq: Every day | ORAL | 0 refills | Status: DC

## 2020-12-09 NOTE — Telephone Encounter (Signed)
Pt state through MyChart that her Fasting CBG are 139, per Dr Volanda Napoleon advise if pt blood sugars are higher that 130 to send Glipizide 5 mg to pt pharmacy, Rx sent to pharmacy and pt notified through [place] portal

## 2021-01-06 ENCOUNTER — Ambulatory Visit (INDEPENDENT_AMBULATORY_CARE_PROVIDER_SITE_OTHER): Payer: BC Managed Care – PPO | Admitting: Psychiatry

## 2021-01-06 ENCOUNTER — Other Ambulatory Visit: Payer: Self-pay

## 2021-01-06 ENCOUNTER — Encounter: Payer: Self-pay | Admitting: Psychiatry

## 2021-01-06 DIAGNOSIS — F320 Major depressive disorder, single episode, mild: Secondary | ICD-10-CM | POA: Diagnosis not present
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
## 2021-01-07 ENCOUNTER — Other Ambulatory Visit: Payer: Self-pay | Admitting: Family Medicine

## 2021-01-07 DIAGNOSIS — Z8669 Personal history of other diseases of the nervous system and sense organs: Secondary | ICD-10-CM

## 2021-01-07 NOTE — Telephone Encounter (Signed)
Pt LOV was on 09/19/2020 and last refill done on 11/13/2019 for 10 tablets with 5 refills, please advise if ok to send refill

## 2021-01-08 ENCOUNTER — Encounter: Payer: Self-pay | Admitting: Psychiatry

## 2021-01-08 ENCOUNTER — Ambulatory Visit (INDEPENDENT_AMBULATORY_CARE_PROVIDER_SITE_OTHER): Payer: BC Managed Care – PPO | Admitting: Psychiatry

## 2021-01-08 ENCOUNTER — Other Ambulatory Visit: Payer: Self-pay

## 2021-01-08 DIAGNOSIS — F321 Major depressive disorder, single episode, moderate: Secondary | ICD-10-CM | POA: Diagnosis not present

## 2021-01-08 NOTE — Progress Notes (Signed)
      [facility] Counselor/Therapist Progress Note  Patient ID: Gloria [name], MRN: [id number],    Date: 01/08/2021  Time Spent: 59 minutes   Treatment Type: Individual Therapy  Reported Symptoms: sadness, tearfulness, poor concentration, social withdrawal, lack of motivation, severe anhedonia.  Mental Status Exam:  Appearance:   Casual and Well Groomed     Behavior:  Appropriate  Motor:  Normal  Speech/Language:   Clear and Coherent  Affect:  Depressed and Tearful  Mood:  anxious, depressed and sad  Thought process:  normal  Thought content:    Rumination  Sensory/Perceptual disturbances:    WNL  Orientation:  oriented to person, place, time/date and situation  Attention:  Good  Concentration:  Good  Memory:  WNL  Fund of knowledge:   Good  Insight:    Good  Judgment:   Good  Impulse Control:  Good   Risk Assessment: Danger to Self:  No Self-injurious Behavior: No Danger to Others: No Duty to Warn:no Physical Aggression / Violence:No  Access to Firearms a concern: No  Gang Involvement:No   Subjective: The client reports that she was able to meet with Gloria Poling, NP yesterday.  The client's Rexulti was increased to 4 mg.  The client reports that she has turned in her 2 weeks notice at her job.  Today she feels a sadness in her gut at a subjective units of distress of 10.  I started eye-movement with the client and she became very tearful, sad and overwhelmed.  She states that she is not sure if she can finish out her 2 weeks notice due to how depressed she is.  The client feels overwhelmed, sad, tearful, poor concentration, social withdrawal, lack of motivation and severe anhedonia.  We discussed the client ending her work before her 2 weeks notice is up.  I offered to write the client a letter to [facility] explaining that the severity of her major depressive disorder prevented her from being able to complete her notice.  The client agreed and signed a release of  information.  She will take the letter to human resources and her Animator.  Her subjective units of distress was at a 5 at the end of the session.  Interventions: Assertiveness/Communication, Motivational Interviewing, Solution-Oriented/Positive Psychology, [employer] Group Desensitization and Reprocessing (EMDR) and Insight-Oriented  Diagnosis:   ICD-10-CM   1. Major depressive disorder, single episode, moderate ([place])  F32.1     Plan: Take letter to her manager in human resources at her work, mood independent behavior, self-care, positive self talk, mild exercise, social network, sleep hygiene.  Cordelro Gautreau, [facility]

## 2021-01-09 ENCOUNTER — Other Ambulatory Visit: Payer: Self-pay | Admitting: Family Medicine

## 2021-01-12 ENCOUNTER — Other Ambulatory Visit: Payer: Self-pay

## 2021-01-12 ENCOUNTER — Ambulatory Visit (INDEPENDENT_AMBULATORY_CARE_PROVIDER_SITE_OTHER): Payer: BC Managed Care – PPO | Admitting: Psychiatry

## 2021-01-12 ENCOUNTER — Other Ambulatory Visit: Payer: Self-pay | Admitting: Family Medicine

## 2021-01-12 ENCOUNTER — Encounter: Payer: Self-pay | Admitting: Psychiatry

## 2021-01-12 DIAGNOSIS — F321 Major depressive disorder, single episode, moderate: Secondary | ICD-10-CM
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
## 2021-01-21 ENCOUNTER — Encounter: Payer: Self-pay | Admitting: Psychiatry

## 2021-01-21 ENCOUNTER — Ambulatory Visit (INDEPENDENT_AMBULATORY_CARE_PROVIDER_SITE_OTHER): Payer: BC Managed Care – PPO | Admitting: Psychiatry

## 2021-01-21 ENCOUNTER — Other Ambulatory Visit: Payer: Self-pay

## 2021-01-21 DIAGNOSIS — F321 Major depressive disorder, single episode, moderate: Secondary | ICD-10-CM | POA: Diagnosis not present
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
## 2021-01-27 ENCOUNTER — Other Ambulatory Visit: Payer: Self-pay

## 2021-01-27 ENCOUNTER — Ambulatory Visit (INDEPENDENT_AMBULATORY_CARE_PROVIDER_SITE_OTHER): Payer: BC Managed Care – PPO | Admitting: Psychiatry

## 2021-01-27 ENCOUNTER — Encounter: Payer: Self-pay | Admitting: Psychiatry

## 2021-01-27 DIAGNOSIS — F321 Major depressive disorder, single episode, moderate: Secondary | ICD-10-CM

## 2021-01-27 NOTE — Progress Notes (Signed)
      [facility] Counselor/Therapist Progress Note  Patient ID: Gloria [name], MRN: [id number],    Date: 01/27/2021  Time Spent: 50 minutes   Treatment Type: Individual Therapy  Reported Symptoms: sadness, social withdrawal, lack of motivation  Mental Status Exam:  Appearance:   Casual     Behavior:  Appropriate  Motor:  Normal  Speech/Language:   Clear and Coherent  Affect:  Depressed and Tearful  Mood:  depressed and sad  Thought process:  normal  Thought content:    WNL  Sensory/Perceptual disturbances:    WNL  Orientation:  oriented to person, place, time/date and situation  Attention:  Good  Concentration:  Good  Memory:  WNL  Fund of knowledge:   Good  Insight:    Good  Judgment:   Good  Impulse Control:  Good   Risk Assessment: Danger to Self:  No Self-injurious Behavior: No Danger to Others: No Duty to Warn:no Physical Aggression / Violence:No  Access to Firearms a concern: No  Gang Involvement:No   Subjective: The client states she feels like she made progress from last session.  "At least I have not been mentally focused on the abandonment issues."  She admits that she has been avoidant and not doing anything.  "It feels so bad."  I pointed out to the client that she is in a major depressive episode which is why she does not feel like doing anything.  The client admits that in the past she would push herself hard and then crash.  We discussed that has she set the bar too high?  We then discussed options for work that the client is looking into.  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 I asked the client if she was getting outside or doing any walking?  She states that she is not.  Other people she said are pushing her to do so but so far she has not done so.  She did say that her spiritual life has improved.  She has found some devotional materials that she finds to be very helpful for her.  "I know God is for me and will  not drop me.  I am very confident of that."  I used eye-movement with the client as she was processing through these different thoughts and feelings.  I use the eye-movement to reinforce the idea that she can be confident in God.  She left with that as her positive cognition.  Her subjective units of distress went from a 7 to less than 2.  Interventions: Motivational Interviewing, Solution-Oriented/Positive Psychology, [employer] Group Desensitization and Reprocessing (EMDR) and Insight-Oriented  Diagnosis:   ICD-10-CM   1. Major depressive disorder, single episode, moderate (HCC)  F32.1     Plan: Sunlight exposure, walking, journaling, devotionals, mood independent behavior, applying for jobs, self-care, AA meetings.  Kendrea Cerritos, [facility]

## 2021-01-29 ENCOUNTER — Ambulatory Visit: Payer: Self-pay | Admitting: Family Medicine

## 2021-02-03 ENCOUNTER — Encounter: Payer: Self-pay | Admitting: Psychiatry

## 2021-02-03 ENCOUNTER — Other Ambulatory Visit: Payer: Self-pay

## 2021-02-03 ENCOUNTER — Ambulatory Visit (INDEPENDENT_AMBULATORY_CARE_PROVIDER_SITE_OTHER): Payer: BC Managed Care – PPO | Admitting: Psychiatry

## 2021-02-03 DIAGNOSIS — F321 Major depressive disorder, single episode, moderate: Secondary | ICD-10-CM | POA: Diagnosis not present

## 2021-02-03 NOTE — Progress Notes (Signed)
      [facility] Counselor/Therapist Progress Note  Patient ID: Gloria [name], MRN: [id number],    Date: 02/03/2021  Time Spent: 50 minutes   Treatment Type: Individual Therapy  Reported Symptoms: sad, anxious, tearful  Mental Status Exam:   Appearance:   Casual     Behavior:  Appropriate  Motor:  Normal  Speech/Language:   Clear and Coherent  Affect:  Tearful  Mood:  anxious, depressed and sad  Thought process:  normal  Thought content:    WNL  Sensory/Perceptual disturbances:    WNL  Orientation:  oriented to person, place, time/date and situation  Attention:  Good  Concentration:  Good  Memory:  WNL  Fund of knowledge:   Good  Insight:    Good  Judgment:   Good  Impulse Control:  Good   Risk Assessment: Danger to Self:  No Self-injurious Behavior: No Danger to Others: No Duty to Warn:no Physical Aggression / Violence:No  Access to Firearms a concern: No  Gang Involvement:No   Subjective: Client complains that her days are not as productive.  I asked the client how she spent her time?  She states she watches TV and reads some.  I pointed out that she has been in a very stressful situation for a long time and probably just needs some time for recovery.  She stated she was able to spend some time with her son which was good.  She also was offered her friends beach house.  She will travel to that from Thursday through Sunday. Today the client described anxiety and was very tearful.  She is not sure where that comes from.  As the client held the bilateral stimulation hand paddles I asked her when was the first time she felt that level of sadness?  She stated when her paternal grandfather died.  At this point she became exceptionally tearful.  Her subjective units of distress was at a 7+.  She stated that he was very attentive to her.  It made her feel worthwhile.  She stated she felt worth his time.  She realized that she was not a priority to her husband, to her  boyfriend or to her parents.  Being special to her grandfather and losing that was a great loss.  As the client continued to process she was able to reduce her subjective units of distress to less than 2.  She noted that journaling just does not work for her so we agreed she not journal.  On her time away she will try the spiritual practice of listening prayer.  She is hopeful the guide will give her some guidance as to her next steps.  She continues to follow-up with her sponsor and go to [employer].  Interventions: Motivational Interviewing, Solution-Oriented/Positive Psychology, [employer] Group Desensitization and Reprocessing (EMDR) and Insight-Oriented  Diagnosis:   ICD-10-CM   1. Major depressive disorder, single episode, moderate (HCC)  F32.1     Plan: Work with a sponsor, [employer], positive self talk, self-care, reading, listening prayer.  Kierre Hintz, [facility]

## 2021-02-10 ENCOUNTER — Encounter: Payer: Self-pay | Admitting: Psychiatry

## 2021-02-10 ENCOUNTER — Ambulatory Visit (INDEPENDENT_AMBULATORY_CARE_PROVIDER_SITE_OTHER): Payer: BC Managed Care – PPO | Admitting: Psychiatry

## 2021-02-10 ENCOUNTER — Other Ambulatory Visit: Payer: Self-pay

## 2021-02-10 DIAGNOSIS — F321 Major depressive disorder, single episode, moderate: Secondary | ICD-10-CM

## 2021-02-10 NOTE — Progress Notes (Signed)
      [facility] Counselor/Therapist Progress Note  Patient ID: Gloria [name], MRN: [id number],    Date: 02/10/2021  Time Spent: 50 minutes   Treatment Type: Individual Therapy  Reported Symptoms: sadness, lack of motivation, depressed  Mental Status Exam:  Appearance:   Casual and Well Groomed     Behavior:  Appropriate  Motor:  Normal  Speech/Language:   Clear and Coherent  Affect:  Appropriate  Mood:  depressed and sad  Thought process:  normal  Thought content:    WNL  Sensory/Perceptual disturbances:    WNL  Orientation:  oriented to person, place, time/date and situation  Attention:  Good  Concentration:  Good  Memory:  WNL  Fund of knowledge:   Good  Insight:    Good  Judgment:   Good  Impulse Control:  Good   Risk Assessment: Danger to Self:  No Self-injurious Behavior: No Danger to Others: No Duty to Warn:no Physical Aggression / Violence:No  Access to Firearms a concern: No  Gang Involvement:No   Subjective: The client states that her time at the beach went well.  Right before she left to leave she had an overwhelming crying jag.  Once it was over she felt better and returned to [place].  She states that she has been praying about her next steps and what job she should pursue.  She states the answer she gets is "wait".  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  She plans to put her new resume on there with the hope that she might get some leads.  She feels good about pursuing things this way. Today we used the bilateral stimulation hand paddles with the client.  Her subjective units of distress was a 5+.  The client states that she feels pretty good today.  She is frustrated that people are giving her unsolicited advice.  We discussed ways of responding to that so that she is not hurtful.  I suggested that she thank the person and tell them that she and God are working on a plan.  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  The client felt that that was a good response and will use that.  She also notes that she is unmotivated.  I asked the client to pray and journal about that.  She did say that she was going to [employer] to join with a friend.  I told her to approach it to be healthy and not necessarily to lose weight.  Client agreed.  Her subjective units of distress at the end of the session was less than 1.  Interventions: Assertiveness/Communication, Motivational Interviewing, Solution-Oriented/Positive Psychology, [employer] Group Desensitization and Reprocessing (EMDR) and Insight-Oriented  Diagnosis:   ICD-10-CM   1. Major depressive disorder, single episode, moderate (HCC)  F32.1     Plan: Mood independent behavior, joining [employer], positive self talk, self-care, journaling, mindful prayer, assertiveness, boundaries.  Gloria [name], [facility]

## 2021-02-16 ENCOUNTER — Other Ambulatory Visit: Payer: Self-pay | Admitting: Family Medicine

## 2021-02-17 ENCOUNTER — Other Ambulatory Visit: Payer: Self-pay

## 2021-02-17 ENCOUNTER — Encounter: Payer: Self-pay | Admitting: Psychiatry

## 2021-02-17 ENCOUNTER — Ambulatory Visit (INDEPENDENT_AMBULATORY_CARE_PROVIDER_SITE_OTHER): Payer: BC Managed Care – PPO | Admitting: Psychiatry

## 2021-02-17 DIAGNOSIS — F320 Major depressive disorder, single episode, mild: Secondary | ICD-10-CM | POA: Diagnosis not present

## 2021-02-17 NOTE — Progress Notes (Signed)
      [facility] Counselor/Therapist Progress Note  Patient ID: Purva Vessell, MRN: [id number],    Date: 02/17/2021  Time Spent: 45 minutes   Treatment Type: Individual Therapy  Reported Symptoms: anxiety, sadness  Mental Status Exam:  Appearance:   Casual     Behavior:  Appropriate  Motor:  Normal  Speech/Language:   Clear and Coherent  Affect:  Appropriate  Mood:  anxious and sad  Thought process:  normal  Thought content:    WNL  Sensory/Perceptual disturbances:    WNL  Orientation:  oriented to person, place, time/date and situation  Attention:  Good  Concentration:  Good  Memory:  WNL  Fund of knowledge:   Good  Insight:    Good  Judgment:   Good  Impulse Control:  Good   Risk Assessment: Danger to Self:  No Self-injurious Behavior: No Danger to Others: No Duty to Warn:no Physical Aggression / Violence:No  Access to Firearms a concern: No  Gang Involvement:No   Subjective: The client states that she had to put her dog to sleep unexpectedly last week.  Right after that she had to travel to [place] which was a pleasant diversion.  She stated she had a conflict with her mom over her mother's passive-aggressive behavior.  The client was able to address it in a healthy way and her mother received it well.  She was very pleased with how things worked out.  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  It is the kind of work the client is familiar with and so she has followed up with her resume. Today we started with the bilateral stimulation hand paddles as the client discussed the unexpected death of her 51 year old dog.  She was very sad as she processed this.  She has realized that she cannot work from home because it is, "too quiet."  This causes her anxiety.  She has also written a letter of forgiveness to herself which the client brought with her.  She also had a letter of amends to herself.  We discussed what prompted  the client to do this?  She states that she had been going through her devotionals in the morning and felt God led her to do this.  I discussed with the client speaking positive words over herself (positive self talk) as a way to continue her improvement.  The client has seen a reduction in her depressive symptoms.  She is considering still going to the gym.  She is letting herself be open to what the future Jovanne Riggenbach hold.  She feels more positive.  At the end of the session the client's subjective units of distress went from a 5+ to less than 1.  Interventions: Assertiveness/Communication, Motivational Interviewing, Solution-Oriented/Positive Psychology, [employer] Group Desensitization and Reprocessing (EMDR) and Insight-Oriented  Diagnosis:   ICD-10-CM   1. Major depressive disorder, single episode, mild ([place])  F32.0     Plan: Journaling, letter of amends and forgiveness, positive self talk, self-care, assertiveness, boundaries, exercise?  Ikhlas Albo, [facility]

## 2021-02-25 ENCOUNTER — Ambulatory Visit: Payer: BC Managed Care – PPO | Admitting: Psychiatry

## 2021-03-03 ENCOUNTER — Ambulatory Visit (INDEPENDENT_AMBULATORY_CARE_PROVIDER_SITE_OTHER): Payer: BC Managed Care – PPO | Admitting: Psychiatry

## 2021-03-03 ENCOUNTER — Encounter: Payer: Self-pay | Admitting: Psychiatry

## 2021-03-03 ENCOUNTER — Other Ambulatory Visit: Payer: Self-pay

## 2021-03-03 DIAGNOSIS — F320 Major depressive disorder, single episode, mild: Secondary | ICD-10-CM

## 2021-03-03 NOTE — Progress Notes (Signed)
      [facility] Counselor/Therapist Progress Note  Patient ID: Gloria [name], MRN: [id number],    Date: 03/03/2021  Time Spent: 50 minutes   Treatment Type: Individual Therapy  Reported Symptoms: sad  Mental Status Exam:  Appearance:   Casual and Well Groomed     Behavior:  Appropriate  Motor:  Normal  Speech/Language:   Clear and Coherent  Affect:  Appropriate  Mood:  sad  Thought process:  normal  Thought content:    WNL  Sensory/Perceptual disturbances:    WNL  Orientation:  oriented to person, place, time/date and situation  Attention:  Good  Concentration:  Good  Memory:  WNL  Fund of knowledge:   Good  Insight:    Good  Judgment:   Good  Impulse Control:  Good   Risk Assessment: Danger to Self:  No Self-injurious Behavior: No Danger to Others: No Duty to Warn:no Physical Aggression / Violence:No  Access to Firearms a concern: No  Gang Involvement:No   Subjective: The client stated that she had to cancel her last appointment because she was at the emergency room with her mother.  Her mother was having problems with breathing and was throwing up.  Her mother has COPD.  The client stated that the ER doctor believed that the client's mother's medications were causing the problem.  She will review these with the pharmacist to find out which ones are interacting with each other.  The client was very relieved that her mother has chosen to move out of her house and has put a down payment on independent living space at [employer] in [place]. The client continues to look for jobs.  She had a second interview for an accounting position.  "As the interview went on I had a bad feeling."  The client realized that the person interviewing her was using all the same verbiage that her previous boss had used.  The client stated after the call she had a panic attack.  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  "I am not rushing to  get a job.  I am letting it come to me." The client states that she still has been sad missing her dog.  She states she cannot get another dog at this point because it would be too much responsibility with all the things going on in her life.  She recently spoke at an [place] meeting and found it was very well received.  She has gone deeper into her spiritual life and found great comfort.  I encouraged the client to continue in mindful prayer focusing on what the [facility] might want for her.  The client agreed.  Interventions: Assertiveness/Communication, Motivational Interviewing, Solution-Oriented/Positive Psychology and Insight-Oriented  Diagnosis:   ICD-10-CM   1. Major depressive disorder, single episode, mild (HCC)  F32.0     Plan: Mood independent behavior, assertiveness, boundaries, mild exercise, positive self talk, self-care, engaged activities, social network.  Tsutomu Barfoot, [facility]

## 2021-03-10 ENCOUNTER — Encounter: Payer: Self-pay | Admitting: Psychiatry

## 2021-03-10 ENCOUNTER — Other Ambulatory Visit: Payer: Self-pay

## 2021-03-10 ENCOUNTER — Ambulatory Visit (INDEPENDENT_AMBULATORY_CARE_PROVIDER_SITE_OTHER): Payer: BC Managed Care – PPO | Admitting: Psychiatry

## 2021-03-10 DIAGNOSIS — F320 Major depressive disorder, single episode, mild: Secondary | ICD-10-CM | POA: Diagnosis not present

## 2021-03-10 NOTE — Progress Notes (Signed)
      [facility] Counselor/Therapist Progress Note  Patient ID: Gloria [name], MRN: [id number],    Date: 03/10/2021  Time Spent: 50 minutes   Treatment Type: Individual Therapy  Reported Symptoms: sad, tearful  Mental Status Exam:  Appearance:   Casual     Behavior:  Appropriate  Motor:  Normal  Speech/Language:   Clear and Coherent  Affect:  Tearful  Mood:  sad  Thought process:  normal  Thought content:    WNL  Sensory/Perceptual disturbances:    WNL  Orientation:  oriented to person, place, time/date and situation  Attention:  Good  Concentration:  Good  Memory:  WNL  Fund of knowledge:   Good  Insight:    Good  Judgment:   Good  Impulse Control:  Good   Risk Assessment: Danger to Self:  No Self-injurious Behavior: No Danger to Others: No Duty to Warn:no Physical Aggression / Violence:No  Access to Firearms a concern: No  Gang Involvement:No   Subjective: The client has just returned from [place] seeing her older sister.  She enjoyed the time and felt it was a good break for her.  Her mother has put a deposit down with [employer] at [employer] in [place], [place].  The client states that her mother is much more motivated and has started cleaning up the property and making plans to pare down what is in the house.  The client is going to suggest that her mother invite some refugee families over to "shop" through the house as a way to help them.  The client thinks her mother would be very open to this.  She is relieved at the progress her mother is making and the self-motivation that she is exhibiting.  This has taken a weight off the client. The client states today that she has been proactively calm.  She will be submitting more resumes today and trying to follow up with the resumes that she sent last week.  She feels at peace to be okay where she is.  She is enjoying her time alone reading books and attending [place] meetings.  She has good friends that she meets  up with that is refreshing for her. Today the client had some tearfulness.  We used eye movement helping the client to process through what she was feeling.  She was able to reduce it from a subjective units of distress of 5 to 0.  She also noticed that the headache she had disappeared.  She plans on resting since she spent most of the night traveling from [place].  Interventions: Assertiveness/Communication, Motivational Interviewing, Solution-Oriented/Positive Psychology, [employer] Group Desensitization and Reprocessing (EMDR) and Insight-Oriented  Diagnosis:   ICD-10-CM   1. Major depressive disorder, single episode, mild ([place])  F32.0     Plan: Boundaries, assertiveness, AA meetings, engaged activities, social network, positive self talk.  Traeson Dusza, [facility]

## 2021-03-12 ENCOUNTER — Ambulatory Visit: Payer: BC Managed Care – PPO | Admitting: Family Medicine

## 2021-03-16 DIAGNOSIS — M5416 Radiculopathy, lumbar region: Secondary | ICD-10-CM | POA: Insufficient documentation

## 2021-03-17 ENCOUNTER — Ambulatory Visit (INDEPENDENT_AMBULATORY_CARE_PROVIDER_SITE_OTHER): Payer: BC Managed Care – PPO | Admitting: Psychiatry

## 2021-03-17 ENCOUNTER — Other Ambulatory Visit: Payer: Self-pay

## 2021-03-17 ENCOUNTER — Encounter: Payer: Self-pay | Admitting: Psychiatry

## 2021-03-17 DIAGNOSIS — F320 Major depressive disorder, single episode, mild: Secondary | ICD-10-CM | POA: Diagnosis not present

## 2021-03-17 NOTE — Progress Notes (Signed)
      [facility] Counselor/Therapist Progress Note  Patient ID: Gloria [name], MRN: [id number],    Date: 03/17/2021  Time Spent: 45 minutes   Treatment Type: Individual Therapy  Reported Symptoms: sad  Mental Status Exam:  Appearance:   Casual and Well Groomed     Behavior:  Appropriate  Motor:  Normal  Speech/Language:   Clear and Coherent  Affect:  Appropriate  Mood:  sad  Thought process:  normal  Thought content:    WNL  Sensory/Perceptual disturbances:    WNL  Orientation:  oriented to person, place, time/date and situation  Attention:  Good  Concentration:  Good  Memory:  WNL  Fund of knowledge:   Good  Insight:    Good  Judgment:   Good  Impulse Control:  Good   Risk Assessment: Danger to Self:  No Self-injurious Behavior: No Danger to Others: No Duty to Warn:no Physical Aggression / Violence:No  Access to Firearms a concern: No  Gang Involvement:No   Subjective: The client states that she still has sadness around the death of her dog.  "He was always there.  He went through a lot of my life with me."  Today I used eye movement and the bilateral stimulation hand paddles with the client working through her sadness around her dog's death.  Her subjective units of distress was a 6.  As she processed she began to let go of more of the sadness.  She developed more radical acceptance with the fact that everything in life will come to an end.  We discussed what she could do to use her time more wisely since a lot of it was spent taking care of her dog.  We discussed exercise which also caused the client to tear up.  She explained that she used to be teased a lot for not being athletic in middle school and high school.  That makes her very fearful about approaching exercise.  I discussed taking things in small chunks such as walking around her block.  Client admitted she was fearful of that because she has periodically lost control of her bowels which embarrassed her  quite a bit.  We discussed walking short distances near her house so she has access to the bathroom but do a lot of them.  The client had not considered that.  She also stated she will follow-up with [employer] to evaluate that as well.  I explained to the client that I would be retiring at the end of the week which she understood.  She has agreed that she has done the work she is needed to do and will take a break for the time being.  The client does have a handout of therapists in this office as well as therapist in the community that she could access if necessary.  Subjective units of distress was less than 2.  Interventions: Motivational Interviewing, Solution-Oriented/Positive Psychology, [employer] Group Desensitization and Reprocessing (EMDR) and Insight-Oriented  Diagnosis:   ICD-10-CM   1. Major depressive disorder, single episode, mild (HCC)  F32.0     Plan: Exercise, reading, mindful prayer, AA, positive self talk, self-care, radical acceptance.  Cesilia Shinn, [facility]

## 2021-03-24 ENCOUNTER — Ambulatory Visit: Payer: BC Managed Care – PPO | Admitting: Psychiatry

## 2021-03-24 ENCOUNTER — Other Ambulatory Visit: Payer: Self-pay | Admitting: Family Medicine

## 2021-03-24 DIAGNOSIS — M5136 Other intervertebral disc degeneration, lumbar region: Secondary | ICD-10-CM

## 2021-05-12 ENCOUNTER — Other Ambulatory Visit: Payer: Self-pay | Admitting: Family Medicine

## 2021-05-12 DIAGNOSIS — Z1231 Encounter for screening mammogram for malignant neoplasm of breast: Secondary | ICD-10-CM

## 2021-05-13 ENCOUNTER — Other Ambulatory Visit: Payer: Self-pay | Admitting: Family Medicine

## 2021-05-20 ENCOUNTER — Encounter: Payer: Self-pay | Admitting: Family Medicine

## 2021-05-20 ENCOUNTER — Other Ambulatory Visit: Payer: Self-pay

## 2021-05-20 ENCOUNTER — Ambulatory Visit: Payer: BC Managed Care – PPO | Admitting: Family Medicine

## 2021-05-20 VITALS — BP 140/90 | HR 80 | Temp 98.3°F | Wt 276.2 lb

## 2021-05-20 DIAGNOSIS — M67449 Ganglion, unspecified hand: Secondary | ICD-10-CM | POA: Diagnosis not present

## 2021-05-20 DIAGNOSIS — E1169 Type 2 diabetes mellitus with other specified complication: Secondary | ICD-10-CM

## 2021-05-20 LAB — POCT GLYCOSYLATED HEMOGLOBIN (HGB A1C): Hemoglobin A1C: 6.3 % — AB (ref 4.0–5.6)

## 2021-05-20 NOTE — Progress Notes (Signed)
Subjective:    Patient ID: Gloria [name], female    DOB: [birth year], 50 y.o.   MRN: [id number]  Chief Complaint  Patient presents with   knot of finger    Right pinky     HPI Patient was seen today for f/u.  Pt states she is doing better now that she eliminated extra stress by leaving her job.  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  Pt celebrated 13 yrs of sobriety on 6/13.  Pt notes wt gain as having sugar cravings at night.  Will have 4 cookies nightly.   Past Medical History:  Diagnosis Date   Allergy    Anemia    hx of- prior to hysterectomy   Anxiety    Arthritis    Bulging disc    X 2   Chronic active hepatitis (HCC)    Chronic back pain 02/25/2015   Chronic headaches    Chronic kidney disease    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   Depression    Diverticulosis    Elevated LFTs    Fibromyalgia    GERD (gastroesophageal reflux disease)    History of ETOH abuse    Hyperlipidemia    Hyperplastic colon polyp    Internal hemorrhoids    NAFLD (nonalcoholic fatty liver disease)    Obese    Obstructive sleep apnea 02/25/2015   not diagnosed per pt 04-03-20   Osteoarthritis    lower back- DDD   Periodic limb movement disorder 02/25/2014   Pre-diabetes    pt taking metformin   RLS (restless legs syndrome)    Sleep apnea    Smoker    Substance abuse (HCC)     Allergies  Allergen Reactions   Ropinirole Other (See Comments)   Benadryl [Diphenhydramine Hcl] Hives   Levofloxacin Other (See Comments)   Aspirin    Codeine    Doxycycline    Metformin And Related     Blurred vision   Neosporin [Bacitracin-Polymyxin B]    Nuvigil [Armodafinil]     migraines    Requip [Ropinirole Hcl]     Increased headache   Semaglutide Other (See Comments)    Rybelsus caused depression and fibromyalgia flair.   Wellbutrin [Bupropion]    Zolpidem     ROS General: Denies fever, chills, night sweats, changes in weight, changes in appetite +weight gain HEENT: Denies headaches, ear  pain, changes in vision, rhinorrhea, sore throat CV: Denies CP, palpitations, SOB, orthopnea Pulm: Denies SOB, cough, wheezing GI: Denies abdominal pain, nausea, vomiting, diarrhea, constipation GU: Denies dysuria, hematuria, frequency, vaginal discharge Msk: Denies muscle cramps, joint pains  +bumps in hands Neuro: Denies weakness, numbness, tingling Skin: Denies rashes, bruising  Psych: Denies depression, anxiety, hallucinations    Objective:    Blood pressure 140/90, pulse 80, temperature 98.3 F (36.8 C), temperature source Oral, weight 276 lb 3.2 oz (125.3 kg), last menstrual period 04/25/2013, SpO2 98 %.  Gen. Pleasant, well-nourished, in no distress, normal affect   HEENT: [place]/AT, face symmetric, conjunctiva clear, no scleral icterus, PERRLA, EOMI, nares patent without drainage Lungs: no accessory muscle use, CTAB, no wheezes or rales Cardiovascular: RRR, no m/r/g, no peripheral edema Abdomen: BS present, soft, NT/ND, no hepatosplenomegaly. Musculoskeletal: Round firm nodules in R palm at MCP joint.  No deformities, no cyanosis or clubbing, normal tone Neuro:  A&Ox3, CN II-XII intact, normal gait Skin:  Warm, no lesions/ rash   Wt Readings from Last 3 Encounters:  05/20/21 276 lb 3.2 oz (  125.3 kg)  11/03/20 254 lb (115.2 kg)  09/23/20 262 lb 8 oz (119.1 kg)    Lab Results  Component Value Date   WBC 9.3 09/19/2020   HGB 13.9 09/19/2020   HCT 42.0 09/19/2020   PLT 288 09/19/2020   GLUCOSE 139 (H) 09/19/2020   CHOL 244 (H) 09/19/2020   TRIG 176 (H) 09/19/2020   HDL 40 (L) 09/19/2020   LDLDIRECT 145.0 07/18/2019   LDLCALC 171 (H) 09/19/2020   ALT 15 09/19/2020   AST 14 09/19/2020   NA 136 09/19/2020   K 4.2 09/19/2020   CL 103 09/19/2020   CREATININE 0.61 09/19/2020   BUN 12 09/19/2020   CO2 23 09/19/2020   TSH 2.24 09/19/2020   INR 1.0 07/21/2020   HGBA1C 6.3 (A) 05/20/2021   MICROALBUR <0.7 03/13/2020    Assessment/Plan:  Ganglion cyst of joint of  finger  - Plan: Ambulatory referral to Hand Surgery  Type 2 diabetes mellitus with other specified complication, without long-term current use of insulin (HCC) -Hgb A1C 6.3% was 5.7% on 09/19/20 -lifestyle modifications strongly encouraged -continue current meds glipizide 5 mg daily  - Plan: POCT glycosylated hemoglobin (Hb A1C)  F/u in 2-3 months  Grier Mitts, MD

## 2021-05-21 ENCOUNTER — Encounter: Payer: BC Managed Care – PPO | Admitting: Neurology

## 2021-05-21 VITALS — BP 124/66 | HR 77 | Ht 65.0 in | Wt 276.0 lb

## 2021-05-21 NOTE — Progress Notes (Signed)
Error   This encounter was created in error - please disregard. 

## 2021-05-22 DIAGNOSIS — M25552 Pain in left hip: Secondary | ICD-10-CM | POA: Insufficient documentation

## 2021-06-04 ENCOUNTER — Encounter: Payer: Self-pay | Admitting: Psychiatry

## 2021-06-04 ENCOUNTER — Other Ambulatory Visit: Payer: Self-pay

## 2021-06-04 ENCOUNTER — Ambulatory Visit: Payer: BC Managed Care – PPO | Admitting: Psychiatry

## 2021-06-04 DIAGNOSIS — F3341 Major depressive disorder, recurrent, in partial remission: Secondary | ICD-10-CM | POA: Diagnosis not present
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
## 2021-06-08 ENCOUNTER — Encounter: Payer: Self-pay | Admitting: Family Medicine

## 2021-06-22 DIAGNOSIS — R2231 Localized swelling, mass and lump, right upper limb: Secondary | ICD-10-CM | POA: Diagnosis not present

## 2021-06-24 ENCOUNTER — Encounter: Payer: Self-pay | Admitting: Family Medicine

## 2021-06-24 DIAGNOSIS — M7062 Trochanteric bursitis, left hip: Secondary | ICD-10-CM | POA: Diagnosis not present

## 2021-06-24 DIAGNOSIS — R635 Abnormal weight gain: Secondary | ICD-10-CM

## 2021-06-24 DIAGNOSIS — M87852 Other osteonecrosis, left femur: Secondary | ICD-10-CM | POA: Diagnosis not present

## 2021-06-24 DIAGNOSIS — M25552 Pain in left hip: Secondary | ICD-10-CM | POA: Diagnosis not present

## 2021-06-25 ENCOUNTER — Ambulatory Visit (INDEPENDENT_AMBULATORY_CARE_PROVIDER_SITE_OTHER): Payer: BC Managed Care – PPO | Admitting: Psychiatry

## 2021-06-25 ENCOUNTER — Other Ambulatory Visit: Payer: Self-pay

## 2021-06-25 DIAGNOSIS — F330 Major depressive disorder, recurrent, mild: Secondary | ICD-10-CM

## 2021-06-25 DIAGNOSIS — S93524A Sprain of metatarsophalangeal joint of right lesser toe(s), initial encounter: Secondary | ICD-10-CM | POA: Diagnosis not present
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
## 2021-06-30 ENCOUNTER — Other Ambulatory Visit: Payer: Self-pay | Admitting: Physician Assistant

## 2021-06-30 DIAGNOSIS — M79671 Pain in right foot: Secondary | ICD-10-CM

## 2021-07-02 DIAGNOSIS — M899 Disorder of bone, unspecified: Secondary | ICD-10-CM | POA: Diagnosis not present

## 2021-07-03 ENCOUNTER — Ambulatory Visit
Admission: RE | Admit: 2021-07-03 | Discharge: 2021-07-03 | Disposition: A | Discharge status: Home / Self Care | Payer: BC Managed Care – PPO | Source: Ambulatory Visit | Attending: Physician Assistant | Admitting: Physician Assistant

## 2021-07-03 ENCOUNTER — Other Ambulatory Visit: Payer: Self-pay

## 2021-07-03 ENCOUNTER — Ambulatory Visit
Admission: RE | Admit: 2021-07-03 | Discharge: 2021-07-03 | Disposition: A | Discharge status: Home / Self Care | Payer: BC Managed Care – PPO | Source: Ambulatory Visit | Attending: Family Medicine | Admitting: Family Medicine

## 2021-07-03 DIAGNOSIS — S86311A Strain of muscle(s) and tendon(s) of peroneal muscle group at lower leg level, right leg, initial encounter: Secondary | ICD-10-CM | POA: Diagnosis not present

## 2021-07-03 DIAGNOSIS — M79671 Pain in right foot: Secondary | ICD-10-CM

## 2021-07-03 DIAGNOSIS — Z1231 Encounter for screening mammogram for malignant neoplasm of breast: Secondary | ICD-10-CM

## 2021-07-08 DIAGNOSIS — S86319A Strain of muscle(s) and tendon(s) of peroneal muscle group at lower leg level, unspecified leg, initial encounter: Secondary | ICD-10-CM | POA: Insufficient documentation

## 2021-07-08 DIAGNOSIS — M216X1 Other acquired deformities of right foot: Secondary | ICD-10-CM | POA: Diagnosis not present

## 2021-07-08 DIAGNOSIS — M66371 Spontaneous rupture of flexor tendons, right ankle and foot: Secondary | ICD-10-CM | POA: Diagnosis not present

## 2021-07-09 DIAGNOSIS — G4733 Obstructive sleep apnea (adult) (pediatric): Secondary | ICD-10-CM | POA: Diagnosis not present

## 2021-07-10 DIAGNOSIS — M66371 Spontaneous rupture of flexor tendons, right ankle and foot: Secondary | ICD-10-CM | POA: Diagnosis not present

## 2021-07-10 DIAGNOSIS — M216X1 Other acquired deformities of right foot: Secondary | ICD-10-CM | POA: Diagnosis not present

## 2021-07-20 DIAGNOSIS — R2231 Localized swelling, mass and lump, right upper limb: Secondary | ICD-10-CM | POA: Diagnosis not present

## 2021-07-21 ENCOUNTER — Other Ambulatory Visit: Payer: Self-pay | Admitting: Orthopedic Surgery

## 2021-07-22 DIAGNOSIS — M87852 Other osteonecrosis, left femur: Secondary | ICD-10-CM | POA: Diagnosis not present

## 2021-07-22 DIAGNOSIS — M7062 Trochanteric bursitis, left hip: Secondary | ICD-10-CM | POA: Diagnosis not present

## 2021-07-23 ENCOUNTER — Other Ambulatory Visit: Payer: Self-pay

## 2021-07-23 ENCOUNTER — Ambulatory Visit (INDEPENDENT_AMBULATORY_CARE_PROVIDER_SITE_OTHER): Payer: BC Managed Care – PPO | Admitting: Psychiatry

## 2021-07-23 DIAGNOSIS — F330 Major depressive disorder, recurrent, mild: Secondary | ICD-10-CM
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
## 2021-07-28 ENCOUNTER — Other Ambulatory Visit: Payer: Self-pay

## 2021-07-28 ENCOUNTER — Ambulatory Visit (INDEPENDENT_AMBULATORY_CARE_PROVIDER_SITE_OTHER): Payer: BC Managed Care – PPO | Admitting: Psychiatry

## 2021-07-28 DIAGNOSIS — F330 Major depressive disorder, recurrent, mild: Secondary | ICD-10-CM | POA: Diagnosis not present

## 2021-07-28 NOTE — Progress Notes (Signed)
      [facility] Counselor/Therapist Progress Note  Patient ID: Gloria [name], MRN: [id number],    Date: 07/28/2021  Time Spent: 50 minutes start time 5:57 PM end time 6:47 PM  Treatment Type: Individual Therapy  Reported Symptoms: extreme sadness, anxiety, crying spells, panic, overwhelmed, fatigue  Mental Status Exam:  Appearance:   Casual     Behavior:  Sharing  Motor:  Normal  Speech/Language:   Normal Rate  Affect:  Congruent and Tearful  Mood:  anxious and depressed  Thought process:  normal  Thought content:    WNL  Sensory/Perceptual disturbances:    WNL  Orientation:  oriented to person, place, time/date, and situation  Attention:  Good  Concentration:  Good  Memory:  WNL  Fund of knowledge:   Good  Insight:    Good  Judgment:   Good  Impulse Control:  Good   Risk Assessment: Danger to Self:  No Self-injurious Behavior: No Danger to Others: No Duty to Warn:no Physical Aggression / Violence:No  Access to Firearms a concern: No  Gang Involvement:No   Subjective: Patient was worked in for an emergency session.  She reported that she has been crying constantly and isn't sure what triggered the panic and crying.  She shared she wasn't able to go into work due to not being able to stop the tears.   Had patient participate in an EMDR grounding exercises building a container.  Patient responded very well to the exercise.  Discussed how she could practice it and utilize it between sessions.  Patient reported feeling much better after session and that she could go to work.  Patient also agreed to see her provider to discuss potential medication options.  Interventions: Solution-Oriented/Positive Psychology and Eye Movement Desensitization and Reprocessing (EMDR)  Diagnosis:   ICD-10-CM   1. MDD (major depressive disorder), recurrent episode, mild ([place])  F33.0       Plan: Patient is to utilize coping skills to decrease depression.  Patient is to practice  container to help manage emotions appropriately.  Patient is to meet with provider to discuss medication options.  Patient is to contact clinician if needed to be worked in again for emergency session.  Lina Sayre, [facility]

## 2021-07-29 DIAGNOSIS — Z0289 Encounter for other administrative examinations: Secondary | ICD-10-CM

## 2021-07-29 DIAGNOSIS — F3181 Bipolar II disorder: Secondary | ICD-10-CM | POA: Diagnosis not present

## 2021-08-05 ENCOUNTER — Other Ambulatory Visit: Payer: Self-pay | Admitting: Family Medicine

## 2021-08-06 DIAGNOSIS — F411 Generalized anxiety disorder: Secondary | ICD-10-CM | POA: Diagnosis not present

## 2021-08-06 DIAGNOSIS — F331 Major depressive disorder, recurrent, moderate: Secondary | ICD-10-CM | POA: Diagnosis not present

## 2021-08-06 DIAGNOSIS — F3181 Bipolar II disorder: Secondary | ICD-10-CM | POA: Diagnosis not present

## 2021-08-07 ENCOUNTER — Encounter (INDEPENDENT_AMBULATORY_CARE_PROVIDER_SITE_OTHER): Payer: Self-pay | Admitting: Family Medicine

## 2021-08-07 ENCOUNTER — Other Ambulatory Visit: Payer: Self-pay

## 2021-08-07 ENCOUNTER — Ambulatory Visit (INDEPENDENT_AMBULATORY_CARE_PROVIDER_SITE_OTHER): Payer: BC Managed Care – PPO | Admitting: Family Medicine

## 2021-08-07 VITALS — BP 134/82 | HR 82 | Temp 97.8°F | Ht 65.0 in | Wt 274.0 lb

## 2021-08-07 DIAGNOSIS — R5383 Other fatigue: Secondary | ICD-10-CM | POA: Diagnosis not present

## 2021-08-07 DIAGNOSIS — Z9189 Other specified personal risk factors, not elsewhere classified: Secondary | ICD-10-CM | POA: Diagnosis not present

## 2021-08-07 DIAGNOSIS — E559 Vitamin D deficiency, unspecified: Secondary | ICD-10-CM

## 2021-08-07 DIAGNOSIS — Z6841 Body Mass Index (BMI) 40.0 and over, adult: Secondary | ICD-10-CM

## 2021-08-07 DIAGNOSIS — Z1331 Encounter for screening for depression: Secondary | ICD-10-CM

## 2021-08-07 DIAGNOSIS — E782 Mixed hyperlipidemia: Secondary | ICD-10-CM | POA: Diagnosis not present

## 2021-08-07 DIAGNOSIS — E1169 Type 2 diabetes mellitus with other specified complication: Secondary | ICD-10-CM | POA: Diagnosis not present

## 2021-08-07 DIAGNOSIS — E538 Deficiency of other specified B group vitamins: Secondary | ICD-10-CM

## 2021-08-07 DIAGNOSIS — R0602 Shortness of breath: Secondary | ICD-10-CM

## 2021-08-08 LAB — CBC WITH DIFFERENTIAL/PLATELET
Basophils Absolute: 0.1 10*3/uL (ref 0.0–0.2)
Basos: 1 %
EOS (ABSOLUTE): 0.1 10*3/uL (ref 0.0–0.4)
Eos: 1 %
Hematocrit: 40.2 % (ref 34.0–46.6)
Hemoglobin: 13.2 g/dL (ref 11.1–15.9)
Immature Grans (Abs): 0.1 10*3/uL (ref 0.0–0.1)
Immature Granulocytes: 1 %
Lymphocytes Absolute: 2.4 10*3/uL (ref 0.7–3.1)
Lymphs: 23 %
MCH: 29.1 pg (ref 26.6–33.0)
MCHC: 32.8 g/dL (ref 31.5–35.7)
MCV: 89 fL (ref 79–97)
Monocytes Absolute: 0.8 10*3/uL (ref 0.1–0.9)
Monocytes: 8 %
Neutrophils Absolute: 7 10*3/uL (ref 1.4–7.0)
Neutrophils: 66 %
Platelets: 284 10*3/uL (ref 150–450)
RBC: 4.53 x10E6/uL (ref 3.77–5.28)
RDW: 13.4 % (ref 11.7–15.4)
WBC: 10.4 10*3/uL (ref 3.4–10.8)

## 2021-08-08 LAB — COMPREHENSIVE METABOLIC PANEL
ALT: 13 IU/L (ref 0–32)
AST: 13 IU/L (ref 0–40)
Albumin/Globulin Ratio: 1.6 (ref 1.2–2.2)
Albumin: 4.1 g/dL (ref 3.8–4.9)
Alkaline Phosphatase: 85 IU/L (ref 44–121)
BUN/Creatinine Ratio: 16 (ref 9–23)
BUN: 8 mg/dL (ref 6–24)
Bilirubin Total: 0.2 mg/dL (ref 0.0–1.2)
CO2: 24 mmol/L (ref 20–29)
Calcium: 8.8 mg/dL (ref 8.7–10.2)
Chloride: 101 mmol/L (ref 96–106)
Creatinine, Ser: 0.5 mg/dL — ABNORMAL LOW (ref 0.57–1.00)
Globulin, Total: 2.6 g/dL (ref 1.5–4.5)
Glucose: 117 mg/dL — ABNORMAL HIGH (ref 65–99)
Potassium: 4 mmol/L (ref 3.5–5.2)
Sodium: 138 mmol/L (ref 134–144)
Total Protein: 6.7 g/dL (ref 6.0–8.5)
eGFR: 113 mL/min/{1.73_m2} (ref >59)

## 2021-08-08 LAB — LIPID PANEL WITH LDL/HDL RATIO
Cholesterol, Total: 247 mg/dL — ABNORMAL HIGH (ref 100–199)
HDL: 47 mg/dL (ref >39)
LDL Chol Calc (NIH): 168 mg/dL — ABNORMAL HIGH (ref 0–99)
LDL/HDL Ratio: 3.6 ratio — ABNORMAL HIGH (ref 0.0–3.2)
Triglycerides: 172 mg/dL — ABNORMAL HIGH (ref 0–149)
VLDL Cholesterol Cal: 32 mg/dL (ref 5–40)

## 2021-08-08 LAB — VITAMIN D 25 HYDROXY (VIT D DEFICIENCY, FRACTURES): Vit D, 25-Hydroxy: 45.2 ng/mL (ref 30.0–100.0)

## 2021-08-08 LAB — VITAMIN B12: Vitamin B-12: 608 pg/mL (ref 232–1245)

## 2021-08-08 LAB — HEMOGLOBIN A1C
Est. average glucose Bld gHb Est-mCnc: 131 mg/dL
Hgb A1c MFr Bld: 6.2 % — ABNORMAL HIGH (ref 4.8–5.6)

## 2021-08-08 LAB — T3: T3, Total: 94 ng/dL (ref 71–180)

## 2021-08-08 LAB — INSULIN, RANDOM: INSULIN: 29.5 u[IU]/mL — ABNORMAL HIGH (ref 2.6–24.9)

## 2021-08-08 LAB — T4, FREE: Free T4: 1.06 ng/dL (ref 0.82–1.77)

## 2021-08-08 LAB — TSH: TSH: 2.86 u[IU]/mL (ref 0.450–4.500)

## 2021-08-08 LAB — FOLATE: Folate: >20 ng/mL (ref >3.0)

## 2021-08-09 DIAGNOSIS — G4733 Obstructive sleep apnea (adult) (pediatric): Secondary | ICD-10-CM | POA: Diagnosis not present

## 2021-08-09 NOTE — Progress Notes (Signed)
Chief Complaint:   OBESITY Gloria [name] (MR# [id number]) is a 50 y.o. female who presents for evaluation and treatment of obesity and related comorbidities. Current BMI is Body mass index is 45.6 kg/m. Gloria [name] has been struggling with her weight for many years and has been unsuccessful in either losing weight, maintaining weight loss, or reaching her healthy weight goal.  Gloria [name] is currently in the action stage of change and ready to dedicate time achieving and maintaining a healthier weight. Gloria [name] is interested in becoming our patient and working on intensive lifestyle modifications including (but not limited to) diet and exercise for weight loss.  Gloria [name]'s habits were reviewed today and are as follows: her desired weight loss is 94 lbs, she has been heavy most of her life, she started gaining weight after the birth of her son in 20 and sobriety in 2009, her heaviest weight ever was 289 pounds, she has significant food cravings issues, she snacks frequently in the evenings, she skips meals frequently, she frequently makes poor food choices, she frequently eats larger portions than normal, and she struggles with emotional eating.  Depression Screen Gloria [name]'s Food and Mood (modified PHQ-9) score was 19.  Depression screen PHQ 2/9 08/07/2021  Decreased Interest 3  Down, Depressed, Hopeless 3  PHQ - 2 Score 6  Altered sleeping 3  Tired, decreased energy 3  Change in appetite 3  Feeling bad or failure about yourself  3  Trouble concentrating 1  Moving slowly or fidgety/restless 0  Suicidal thoughts 0  PHQ-9 Score 19  Difficult doing work/chores Very difficult  Some recent data might be hidden   Subjective:   1. Other fatigue Gloria [name] admits to daytime somnolence and denies waking up still tired. Patent has a history of symptoms of daytime fatigue. Gloria [name] generally gets 6 hours of sleep per night, and states that she has generally restful sleep. Snoring is present.  Apneic episodes are present. Epworth Sleepiness Score is 5.  2. SOB (shortness of breath) Gloria [name] notes increasing shortness of breath with exercising and seems to be worsening over time with weight gain. She notes getting out of breath sooner with activity than she used to. This has not gotten worse recently. Gloria [name] denies shortness of breath at rest or orthopnea.  3. Type 2 diabetes mellitus with other specified complication, without long-term current use of insulin (HCC) Gloria [name]'s last A1c was controlled. She has side effects with metformin and she is on glipizide instead which unfortunately is a weight gaining medicine.  4. Mixed hyperlipidemia Gloria [name] is working on diet and exercise. Her last labs were not yet at goal. She denies chest pain.  5. Vitamin D deficiency Gloria [name] is on Vit D, and she is due to have labs checked. She notes fatigue.  6. B12 deficiency Gloria [name] is on multiple B12 vitamins. She is due for labs and she notes fatigue.  7. At risk for heart disease Gloria [name] is at a higher than average risk for cardiovascular disease due to obesity.   Assessment/Plan:   1. Other fatigue Gloria [name] does feel that her weight is causing her energy to be lower than it should be. Fatigue may be related to obesity, depression or many other causes. Labs will be ordered, and in the meanwhile, [name] will focus on self care including making healthy food choices, increasing physical activity and focusing on stress reduction.  - EKG 12-Lead - CBC with Differential/Platelet - Folate - T3 - T4, free - TSH  2. SOB (shortness  of breath) Gloria [name] does feel that she gets out of breath more easily that she used to when she exercises. Gloria [name]'s shortness of breath appears to be obesity related and exercise induced. She has agreed to work on weight loss and gradually increase exercise to treat her exercise induced shortness of breath. Will continue to monitor closely.  3. Type 2 diabetes mellitus  with other specified complication, without long-term current use of insulin (HCC) We will check labs today. Gloria [name] will start her Category 2 plan and potentially look at non-weight gaining options to help her with her weight loss efforts. Good blood sugar control is important to decrease the likelihood of diabetic complications such as nephropathy, neuropathy, limb loss, blindness, coronary artery disease, and death. Intensive lifestyle modification including diet, exercise and weight loss are the first line of treatment for diabetes.   - Comprehensive metabolic panel - Hemoglobin A1c - Insulin, random  4. Mixed hyperlipidemia Cardiovascular risk and specific lipid/LDL goals reviewed. We discussed several lifestyle modifications today. We will check labs today. Gloria [name] will start her Category 2 plan, and will work on diet, exercise and weight loss efforts. Orders and follow up as documented in patient record.   - Lipid Panel With LDL/HDL Ratio  5. Vitamin D deficiency Low Vitamin D level contributes to fatigue and are associated with obesity, breast, and colon cancer. We will check labs today. Gloria [name] will follow-up for routine testing of Vitamin D, at least 2-3 times per year to avoid over-replacement.  - VITAMIN D 25 Hydroxy (Vit-D Deficiency, Fractures)  6. B12 deficiency The diagnosis was reviewed with the patient. We will check labs today. Gloria [name] will continue to follow up as directed. Orders and follow up as documented in patient record.  - Vitamin B12  7. Depression screening Gloria [name] had a positive depression screening. Depression is commonly associated with obesity and often results in emotional eating behaviors. We will monitor this closely and work on CBT to help improve the non-hunger eating patterns. Referral to Psychology may be required if no improvement is seen as she continues in our clinic.  8. At risk for heart disease Gloria [name] was given approximately 30 minutes of  coronary artery disease prevention counseling today. She is 50 y.o. female and has risk factors for heart disease including obesity. We discussed intensive lifestyle modifications today with an emphasis on specific weight loss instructions and strategies.   Repetitive spaced learning was employed today to elicit superior memory formation and behavioral change.  9. Obesity with current BMI of 45.6 Gloria [name] is currently in the action stage of change and her goal is to continue with weight loss efforts. I recommend Gloria [name] begin the structured treatment plan as follows:  She has agreed to the Category 2 Plan + 100 calories.  Exercise goals: No exercise has been prescribed for now, while we concentrate on nutritional changes.  Behavioral modification strategies: increasing lean protein intake, decreasing simple carbohydrates, and no skipping meals.  She was informed of the importance of frequent follow-up visits to maximize her success with intensive lifestyle modifications for her multiple health conditions. She was informed we would discuss her lab results at her next visit unless there is a critical issue that needs to be addressed sooner. Gloria [name] agreed to keep her next visit at the agreed upon time to discuss these results.  Objective:   Blood pressure 134/82, pulse 82, temperature 97.8 F (36.6 C), height 5' 5"  (1.651 m), weight 274 lb (124.3 kg), last menstrual period 04/25/2013, SpO2 95 %.  Body mass index is 45.6 kg/m.  EKG: Normal sinus rhythm, rate 87 BPM.  Indirect Calorimeter completed today shows a VO2 of 270 and a REE of 1858.  Her calculated basal metabolic rate is 8022 thus her basal metabolic rate is worse than expected.  General: Cooperative, alert, well developed, in no acute distress. HEENT: Conjunctivae and lids unremarkable. Cardiovascular: Regular rhythm.  Lungs: Normal work of breathing. Neurologic: No focal deficits.   Lab Results  Component Value Date    CREATININE 0.50 (L) 08/07/2021   BUN 8 08/07/2021   NA 138 08/07/2021   K 4.0 08/07/2021   CL 101 08/07/2021   CO2 24 08/07/2021   Lab Results  Component Value Date   ALT 13 08/07/2021   AST 13 08/07/2021   ALKPHOS 85 08/07/2021   BILITOT 0.2 08/07/2021   Lab Results  Component Value Date   HGBA1C 6.2 (H) 08/07/2021   HGBA1C 6.3 (A) 05/20/2021   HGBA1C 5.7 (H) 09/19/2020   HGBA1C 5.8 (A) 07/31/2020   HGBA1C 5.3 03/13/2020   Lab Results  Component Value Date   INSULIN 29.5 (H) 08/07/2021   Lab Results  Component Value Date   TSH 2.860 08/07/2021   Lab Results  Component Value Date   CHOL 247 (H) 08/07/2021   HDL 47 08/07/2021   LDLCALC 168 (H) 08/07/2021   LDLDIRECT 145.0 07/18/2019   TRIG 172 (H) 08/07/2021   CHOLHDL 6.1 (H) 09/19/2020   Lab Results  Component Value Date   WBC 10.4 08/07/2021   HGB 13.2 08/07/2021   HCT 40.2 08/07/2021   MCV 89 08/07/2021   PLT 284 08/07/2021   Lab Results  Component Value Date   IRON 54 10/06/2017   TIBC 304 10/06/2017   FERRITIN 151 (H) 10/06/2017   Attestation Statements:   Reviewed by clinician on day of visit: allergies, medications, problem list, medical history, surgical history, family history, social history, and previous encounter notes.   I, Trixie Dredge, am acting as transcriptionist for Dennard Nip, MD.  I have reviewed the above documentation for accuracy and completeness, and I agree with the above. - Dennard Nip, MD

## 2021-08-10 ENCOUNTER — Other Ambulatory Visit: Payer: Self-pay

## 2021-08-10 DIAGNOSIS — K7581 Nonalcoholic steatohepatitis (NASH): Secondary | ICD-10-CM

## 2021-08-11 ENCOUNTER — Other Ambulatory Visit: Payer: Self-pay

## 2021-08-11 ENCOUNTER — Encounter: Payer: Self-pay | Admitting: Adult Health

## 2021-08-11 ENCOUNTER — Ambulatory Visit: Payer: BC Managed Care – PPO | Admitting: Adult Health

## 2021-08-11 VITALS — BP 147/87 | HR 77 | Ht 65.0 in | Wt 274.0 lb

## 2021-08-11 DIAGNOSIS — Z9989 Dependence on other enabling machines and devices: Secondary | ICD-10-CM | POA: Diagnosis not present

## 2021-08-11 DIAGNOSIS — G4733 Obstructive sleep apnea (adult) (pediatric): Secondary | ICD-10-CM | POA: Diagnosis not present

## 2021-08-11 NOTE — Progress Notes (Signed)
PATIENT: Cydne Grahn Kraft DOB: 1971-07-13  REASON FOR VISIT: follow up HISTORY FROM: patient PRIMARY NEUROLOGIST: Dr. Brett Fairy  HISTORY OF PRESENT ILLNESS: Today 08/11/21:  Ms. Abdulla is a 50 year old female with a history of obstructive sleep apnea on CPAP.  She returns today for follow-up.  Her download indicates that she used her machine 23 out of 30 days for compliance of 77%.  She use her machine greater than 4 hours 17 days for compliance of 57%.  On average she uses her machine 5 hours and 4 minutes.  Her residual AHI is 0.2 on 6 to 13 cm of water.  Her leak in the 95th percentile is 9.8 L/min.  Patient reports that she has been having trouble with her mask leaking during the night.  She has had a mask refitting.  Reports now if she pulls her straps tight initially it typically prevents the mask from leaking and she is able to use it longer.  HISTORY .(Copied from Dr.Dohmeier's note) Shyia Fillingim is a 50 year- old  Caucasian female patient of Dr.Willis' and seen here upon his and NP's request  on 09/23/2020 for a sleep consultation.   Chief concern according to patient :  " I am chronically fatigued but also a recovering alcoholic, and my last sleep study was about 12 years ago- and I don't know how my baseline has changed since".        I have the pleasure of seeing Caden Fatica today, a right-handed White or Caucasian female with a possible sleep disorder.  She   has a past medical history of Allergy, Anemia, Anxiety, Arthritis, Bulging disc, Chronic active hepatitis ([place]), Chronic back pain (02/25/2015), Chronic headaches, Chronic kidney disease, Depression, Diverticulosis, Elevated LFTs, Fibromyalgia, GERD (gastroesophageal reflux disease), History of ETOH abuse, Hyperlipidemia, Hyperplastic colon polyp, Internal hemorrhoids, NAFLD (nonalcoholic fatty liver disease), Obese, Obstructive sleep apnea (02/25/2015), Osteoarthritis, Periodic limb movement disorder  (02/25/2014), Pre-diabetes, RLS (restless legs syndrome), Sleep apnea, Smoker, and Substance abuse ([place]).   The patient had the first sleep study in the year 2007 . Sleep relevant medical history:  Recovering alcoholic a with sleep walking in childhood,   no tonsillectomy, had cervical spine surgery/ anterior access, Dr Joya Salm.    Family medical /sleep history:  father had RLS, alcoholic, he passed I  [phone], brother with OSA, mother with Insomnia..    Social history:  Patient is working in Press photographer / at [employer] and lives in a household alone.  Family status is single  The patient currently works daytime.  Her dog present. Tobacco use- no.  ETOH use - not in years. , Caffeine intake in form of Coffee( 2 cups ) . Regular exercise in form of walking her dog     Sleep habits are as follows: The patient's dinner time is between 6-7 PM. The patient goes to bed at 10.15 PM and continues to sleep for several hours -wakes from gasping if she is in supine position-  No RLS.  The preferred sleep position is laterally, with the support of several  pillows.  Dreams are reportedly frequent.  6.15 AM is the usual rise time. The patient wakes up with an alarm.   She reports not feeling refreshed or restored in AM, with symptoms such as dry mouth , morning headaches, soreness and achiness, and residual fatigue. Naps are taken on weekends frequently, lasting from 2-2.5 hours and are morerefreshing than nocturnal sleep.  These don't interfere with nocturnal sleep.  I reviewed the patient's most recent lab tests through her primary care provider Morton Amy.  Hb A1c has been reduced to 5.6, her TSH is normal T4 is normal lipid panel shows hyperlipidemia hypercholesterolemia, INR was 1.0 and ALT AST were normal.   REVIEW OF SYSTEMS: Out of a complete 14 system review of symptoms, the patient complains only of the following symptoms, and all other reviewed systems are  negative.  FSS ESS  ALLERGIES: Allergies  Allergen Reactions   Ropinirole Other (See Comments)   Benadryl [Diphenhydramine Hcl] Hives   Levofloxacin Other (See Comments)   Aspirin    Codeine    Doxycycline    Metformin And Related     Blurred vision   Neosporin [Bacitracin-Polymyxin B]    Nuvigil [Armodafinil]     migraines    Requip [Ropinirole Hcl]     Increased headache   Semaglutide Other (See Comments)    Rybelsus caused depression and fibromyalgia flair.   Wellbutrin [Bupropion]    Zolpidem     HOME MEDICATIONS: Outpatient Medications Prior to Visit  Medication Sig Dispense Refill   B Complex Vitamins (B COMPLEX PO) Take 1 tablet by mouth daily.     blood glucose meter kit and supplies KIT Dispense based on patient and insurance preference. Use up to four times daily as directed. (FOR ICD-9 250.00, 250.01). 1 each 0   Blood Glucose Monitoring Suppl (ONETOUCH VERIO FLEX SYSTEM) w/Device KIT Use to check Blood sugars twice daily before meals 1 kit 0   Brexpiprazole (REXULTI) 4 MG TABS Take by mouth.     Calcium Carbonate-Vitamin D 600-400 MG-UNIT tablet Take 1 tablet by mouth 2 (two) times daily.      Cholecalciferol (VITAMIN D3) 50 MCG (2000 UT) TABS Take by mouth.     clotrimazole-betamethasone (LOTRISONE) cream Apply 1 application topically 2 (two) times daily. For 10 days maximum 45 g 1   Coenzyme Q10 (COQ10) 100 MG CAPS Take 100 mg by mouth daily.     diclofenac (VOLTAREN) 75 MG EC tablet Take 75 mg by mouth 2 (two) times daily.     estradiol (VIVELLE-DOT) 0.05 MG/24HR patch Place 1 patch (0.05 mg total) onto the skin 2 (two) times a week. 24 patch 4   fluticasone (CUTIVATE) 0.05 % cream Apply topically 2 (two) times daily.     glipiZIDE (GLUCOTROL) 5 MG tablet TAKE 1 TABLET BY MOUTH DAILY AT 12 PM AT NOON 90 tablet 1   glucose blood (CONTOUR NEXT TEST) test strip Use to test blood glucose twice daily 100 each 5   ibuprofen (ADVIL) 800 MG tablet TAKE 1 TABLET(800  MG) BY MOUTH EVERY 8 HOURS AS NEEDED FOR MODERATE PAIN 30 tablet 3   ketoconazole (NIZORAL) 2 % cream Apply 1 application topically daily.     Lancets (ONETOUCH DELICA PLUS [id number]) MISC USE 1  TO CHECK GLUCOSE 4 TIMES DAILY 100 each 0   loratadine (CLARITIN) 10 MG tablet Take 10 mg by mouth daily.     LORazepam (ATIVAN) 1 MG tablet Take 0.5 mg by mouth at bedtime.     Magnesium 500 MG CAPS Take by mouth.     metroNIDAZOLE (METROCREAM) 0.75 % cream daily     Multiple Vitamins-Minerals (MULTIVITAMIN PO) Take 1 tablet by mouth daily.      nystatin (MYCOSTATIN/NYSTOP) powder Apply topically 2 (two) times daily. 15 g 2   OMEGA 3-6-9 FATTY ACIDS PO Take 500 mg by mouth daily.     Oxcarbazepine (TRILEPTAL)  300 MG tablet Take 300 mg by mouth 2 (two) times daily.     pantoprazole (PROTONIX) 40 MG tablet TAKE 1 TABLET BY MOUTH DAILY 90 tablet 0   PROAIR HFA 108 (90 BASE) MCG/ACT inhaler Inhale 1 puff into the lungs every 4 (four) hours as needed for wheezing or shortness of breath.      rizatriptan (MAXALT) 10 MG tablet TAKE 1 TABLET BY MOUTH AS NEEDED FOR MIGRAINE. MAY REPEAT IN 2 HOURS IF NEEDED 10 tablet 5   thiamine (VITAMIN B-1) 50 MG tablet Take 50 mg by mouth daily.     vitamin C (ASCORBIC ACID) 500 MG tablet Take 500 mg by mouth daily.     Vitamin D, Ergocalciferol, (DRISDOL) 1.25 MG (50000 UT) CAPS capsule Take 50,000 Units by mouth every 7 (seven) days.     VITAMIN E PO Take 900 mg by mouth daily.     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   DDD (degenerative disc disease), lumbar    Depression    Diabetes ([place])    Diverticulosis    Drug use    Elevated LFTs    Fibromyalgia    GERD (gastroesophageal reflux disease)    History of ETOH abuse    Hyperlipidemia    Hyperplastic colon polyp    Internal hemorrhoids    Migraines    NAFLD (nonalcoholic fatty liver disease)    Obese    Obstructive sleep apnea 02/25/2015   not diagnosed per pt 04-03-20   Osteoarthritis    lower back- DDD   Periodic limb movement disorder 02/25/2014   Pre-diabetes    pt taking metformin   RLS (restless legs syndrome)    Sleep apnea    Smoker    Snoring    Substance abuse ([place])    Swallowing difficulty    Vitamin B12 deficiency    Vitamin D deficiency     PAST SURGICAL HISTORY: Past Surgical History:  Procedure Laterality Date   CERVICAL FUSION     COLONOSCOPY     CYSTOSCOPY  06/06/2013   Procedure: CYSTOSCOPY;  Surgeon: Terrance Mass, MD;  Location: [facility];  Service: Gynecology;;   INTRAUTERINE DEVICE INSERTION  08/09/2008   PARAGUARD- removed   kidney surgery  1995   abcess   LAPAROSCOPY N/A 06/06/2013   Procedure: LAPAROSCOPY DIAGNOSTIC;  Surgeon: Terrance Mass, MD;  Location: [facility];  Service: Gynecology;  Laterality: N/A;   LAPAROTOMY  06/06/2013   Procedure: EXPLORATORY LAPAROTOMY;  Surgeon: Terrance Mass, MD;  Location: [facility];  Service: Gynecology;;   LIVER BIOPSY     radial tunnel Bilateral 2005   VAGINAL HYSTERECTOMY Left 06/06/2013   Procedure: Vaginal Hysterectomy with Patiial Right  Salpingectomy;  Surgeon: Terrance Mass, MD;  Location: [facility];  Service: Gynecology;  Laterality: Left;    FAMILY HISTORY: Family History  Problem Relation Age of Onset   Cancer Mother    Breast cancer Mother    Depression Mother    Anxiety disorder Mother    Obesity Mother    Sleep apnea Mother     Obesity Father    Anxiety disorder Father    Cancer Father    Hyperlipidemia Father    Colon polyps Father    Diabetes Father    Cirrhosis Father    Liver cancer Father    Liver disease Father    Alcoholism Father    Depression Father    Alcohol abuse Father    Sleep apnea Father    Irritable bowel syndrome Sister    Sleep apnea Brother    Breast cancer Maternal Aunt    Alcoholism Maternal Aunt    Alcoholism Maternal Grandmother    Alcoholism Maternal Grandfather    Breast cancer Paternal Grandmother    Colon cancer Paternal Grandmother    Esophageal cancer Paternal Grandfather    Stomach cancer Paternal Grandfather    Alcoholism Paternal Grandfather    Rectal cancer Neg Hx     SOCIAL HISTORY: Social History   Socioeconomic History   Marital status: Divorced    Spouse name: Not on file   Number of children: 1   Years of education: 86   Highest education level: Not on file  Occupational History   Occupation: ACCOUNTING   Occupation: Animal nutritionist  Tobacco Use   Smoking status: Former    Packs/day: 1.00    Types: Cigarettes    Quit date: 01/26/2012    Years since quitting: 9.5   Smokeless tobacco: Never  Vaping Use   Vaping Use: Never used  Substance and Sexual Activity   Alcohol use: No    Alcohol/week: 0.0 standard drinks    Comment: History of alcohol abuse   Drug use: No   Sexual activity: Not Currently  Other Topics Concern   Not on file  Social History Narrative   Patient lives at home and works full time.   Education some college.   Right handed.   Caffeine: 2 cups daily.   Social Determinants of Health   Financial Resource Strain: Not on file  Food Insecurity: Not on file  Transportation Needs: Not on file  Physical Activity: Not on file  Stress: Not on file  Social Connections: Not on file  Intimate Partner Violence: Not on file      PHYSICAL EXAM  Vitals:   08/11/21 0753  BP: (!) 147/87  Pulse: 77  Weight: 274 lb (124.3 kg)   Height: 5' 5"  (1.651 m)   Body mass index is 45.6 kg/m.  Generalized: Well developed, in no acute distress  Chest: Lungs clear to auscultation bilaterally  Neurological examination  Mentation: Alert oriented to time, place, history taking. Follows all commands speech and language fluent Cranial nerve II-XII: Extraocular movements were full, visual field were full on confrontational test Head turning and shoulder shrug  were normal and symmetric. Motor: The motor testing reveals 5 over 5 strength of all 4 extremities. Good symmetric motor tone is noted throughout.  Sensory: Sensory testing is intact to soft touch on all 4 extremities. No evidence of extinction is noted.  Gait and station: Gait is normal.    DIAGNOSTIC DATA (LABS, IMAGING, TESTING) - I reviewed patient records, labs, notes, testing  and imaging myself where available.  Lab Results  Component Value Date   WBC 10.4 08/07/2021   HGB 13.2 08/07/2021   HCT 40.2 08/07/2021   MCV 89 08/07/2021   PLT 284 08/07/2021      Component Value Date/Time   NA 138 08/07/2021 0000   K 4.0 08/07/2021 0000   CL 101 08/07/2021 0000   CO2 24 08/07/2021 0000   GLUCOSE 117 (H) 08/07/2021 0000   GLUCOSE 139 (H) 09/19/2020 0847   BUN 8 08/07/2021 0000   CREATININE 0.50 (L) 08/07/2021 0000   CREATININE 0.61 09/19/2020 0847   CALCIUM 8.8 08/07/2021 0000   PROT 6.7 08/07/2021 0000   ALBUMIN 4.1 08/07/2021 0000   AST 13 08/07/2021 0000   ALT 13 08/07/2021 0000   ALKPHOS 85 08/07/2021 0000   BILITOT 0.2 08/07/2021 0000   GFRNONAA 106 09/19/2020 0847   GFRAA 123 09/19/2020 0847   Lab Results  Component Value Date   CHOL 247 (H) 08/07/2021   HDL 47 08/07/2021   LDLCALC 168 (H) 08/07/2021   LDLDIRECT 145.0 07/18/2019   TRIG 172 (H) 08/07/2021   CHOLHDL 6.1 (H) 09/19/2020   Lab Results  Component Value Date   HGBA1C 6.2 (H) 08/07/2021   Lab Results  Component Value Date   VITAMINB12 608 08/07/2021   Lab Results   Component Value Date   TSH 2.860 08/07/2021      ASSESSMENT AND PLAN 50 y.o. year old female  has a past medical history of Alcohol abuse, Allergy, Anemia, Anxiety, Arthritis, Bilateral swelling of feet, Bulging disc, Chronic active hepatitis (HCC), Chronic back pain (02/25/2015), Chronic fatigue syndrome, Chronic headaches, Chronic kidney disease, DDD (degenerative disc disease), lumbar, Depression, Diabetes ([place]), Diverticulosis, Drug use, Elevated LFTs, Fibromyalgia, GERD (gastroesophageal reflux disease), History of ETOH abuse, Hyperlipidemia, Hyperplastic colon polyp, Internal hemorrhoids, Migraines, NAFLD (nonalcoholic fatty liver disease), Obese, Obstructive sleep apnea (02/25/2015), Osteoarthritis, Periodic limb movement disorder (02/25/2014), Pre-diabetes, RLS (restless legs syndrome), Sleep apnea, Smoker, Snoring, Substance abuse ([place]), Swallowing difficulty, Vitamin B12 deficiency, and Vitamin D deficiency. here with:  OSA on CPAP  - CPAP compliance excellent - Good treatment of AHI  - Encourage patient to use CPAP nightly and > 4 hours each night - F/U in 1 year or sooner if needed    Ward Givens, MSN, NP-C 08/11/2021, 8:13 AM [facility] [address] [place], [place] [address] [phone]

## 2021-08-11 NOTE — Patient Instructions (Signed)
Continue using CPAP nightly and greater than 4 hours each night °If your symptoms worsen or you develop new symptoms please let us know.  ° °

## 2021-08-13 ENCOUNTER — Ambulatory Visit: Payer: BC Managed Care – PPO | Admitting: Psychiatry

## 2021-08-19 DIAGNOSIS — M66371 Spontaneous rupture of flexor tendons, right ankle and foot: Secondary | ICD-10-CM | POA: Diagnosis not present

## 2021-08-19 DIAGNOSIS — F3181 Bipolar II disorder: Secondary | ICD-10-CM | POA: Diagnosis not present

## 2021-08-19 DIAGNOSIS — F331 Major depressive disorder, recurrent, moderate: Secondary | ICD-10-CM | POA: Diagnosis not present

## 2021-08-19 DIAGNOSIS — F411 Generalized anxiety disorder: Secondary | ICD-10-CM | POA: Diagnosis not present

## 2021-08-21 ENCOUNTER — Other Ambulatory Visit: Payer: Self-pay

## 2021-08-21 ENCOUNTER — Ambulatory Visit (INDEPENDENT_AMBULATORY_CARE_PROVIDER_SITE_OTHER): Payer: BC Managed Care – PPO | Admitting: Family Medicine

## 2021-08-21 ENCOUNTER — Encounter (INDEPENDENT_AMBULATORY_CARE_PROVIDER_SITE_OTHER): Payer: Self-pay | Admitting: Family Medicine

## 2021-08-21 VITALS — BP 144/82 | HR 82 | Temp 97.8°F | Ht 65.0 in | Wt 272.0 lb

## 2021-08-21 DIAGNOSIS — E782 Mixed hyperlipidemia: Secondary | ICD-10-CM

## 2021-08-21 DIAGNOSIS — Z9189 Other specified personal risk factors, not elsewhere classified: Secondary | ICD-10-CM

## 2021-08-21 DIAGNOSIS — E559 Vitamin D deficiency, unspecified: Secondary | ICD-10-CM | POA: Diagnosis not present

## 2021-08-21 DIAGNOSIS — E1169 Type 2 diabetes mellitus with other specified complication: Secondary | ICD-10-CM | POA: Diagnosis not present

## 2021-08-21 DIAGNOSIS — Z6841 Body Mass Index (BMI) 40.0 and over, adult: Secondary | ICD-10-CM

## 2021-08-21 MED ORDER — OZEMPIC (0.25 OR 0.5 MG/DOSE) 2 MG/1.5ML ~~LOC~~ SOPN: 0.5000 mg | PEN_INJECTOR | SUBCUTANEOUS | 0 refills | Status: DC

## 2021-08-21 NOTE — Progress Notes (Signed)
Chief Complaint:   OBESITY Gloria [name] is here to discuss her progress with her obesity treatment plan along with follow-up of her obesity related diagnoses. Gloria [name] is on the Category 2 Plan + 100 calories and states she is following her eating plan approximately 90% of the time. Gloria [name] states she is doing 0 minutes 0 times per week.  Today's visit was #: 2 Starting weight: 274 lbs Starting date: 08/07/2021 Today's weight: 272 lbs Today's date: 08/21/2021 Total lbs lost to date: 2 Total lbs lost since last in-office visit: 2  Interim History: Gloria [name] has done well with weight loss on her plan. She struggled with cravings in the PM, especially for sweets.  Subjective:   1. Mixed hyperlipidemia Analy's LDL and triglycerides are elevated. She is working on diet and she denies chest pain. I discussed labs with the patient today.  2. Vitamin D deficiency Gloria [name] is on Vit D OTC and prescription Vit D, and her level is almost at goal. I discussed labs with the patient today.  3. Type 2 diabetes mellitus with other specified complication, without long-term current use of insulin (HCC) Gloria [name] is on glipizide, and she didn't tolerates metformin or Rybelsus. She denies gastroparesis. She is willing to look at other medication options. I discussed labs with the patient today.  4. At risk for heart disease Gloria [name] is at a higher than average risk for cardiovascular disease due to obesity.   Assessment/Plan:   1. Mixed hyperlipidemia Cardiovascular risk and specific lipid/LDL goals reviewed. We discussed several lifestyle modifications today. Gloria [name] will continue with diet, exercise and weight loss efforts. We will recheck labs in 3 months. Orders and follow up as documented in patient record.   2. Vitamin D deficiency Low Vitamin D level contributes to fatigue and are associated with obesity, breast, and colon cancer. Gloria [name] will continue OTC and prescription Vitamin D, and we will  recheck labs in 3 months. She will follow-up for routine testing of Vitamin D, at least 2-3 times per year to avoid over-replacement.  3. Type 2 diabetes mellitus with other specified complication, without long-term current use of insulin ([place]) Gloria [name] agreed to start Ozempic 0.25 mg q weekly with no refills. She will continue glipizide for now, and the goal is to discontinue as soon as possible. Good blood sugar control is important to decrease the likelihood of diabetic complications such as nephropathy, neuropathy, limb loss, blindness, coronary artery disease, and death. Intensive lifestyle modification including diet, exercise and weight loss are the first line of treatment for diabetes.   4. At risk for heart disease Gloria [name] was given approximately 30 minutes of coronary artery disease prevention counseling today. She is 50 y.o. female and has risk factors for heart disease including obesity. We discussed intensive lifestyle modifications today with an emphasis on specific weight loss instructions and strategies.   Repetitive spaced learning was employed today to elicit superior memory formation and behavioral change.  5. Obesity with current BMI of 45.4 Gloria [name] is currently in the action stage of change. As such, her goal is to continue with weight loss efforts. She has agreed to the Category 2 Plan + 100 calories.   Behavioral modification strategies: increasing lean protein intake.  Gloria [name] has agreed to follow-up with our clinic in 2 weeks. She was informed of the importance of frequent follow-up visits to maximize her success with intensive lifestyle modifications for her multiple health conditions.   Objective:   Blood pressure (!) 144/82, pulse 82, temperature 97.8  F (36.6 C), height 5' 5"  (1.651 m), weight 272 lb (123.4 kg), last menstrual period 04/25/2013, SpO2 95 %. Body mass index is 45.26 kg/m.  General: Cooperative, alert, well developed, in no acute distress. HEENT:  Conjunctivae and lids unremarkable. Cardiovascular: Regular rhythm.  Lungs: Normal work of breathing. Neurologic: No focal deficits.   Lab Results  Component Value Date   CREATININE 0.50 (L) 08/07/2021   BUN 8 08/07/2021   NA 138 08/07/2021   K 4.0 08/07/2021   CL 101 08/07/2021   CO2 24 08/07/2021   Lab Results  Component Value Date   ALT 13 08/07/2021   AST 13 08/07/2021   ALKPHOS 85 08/07/2021   BILITOT 0.2 08/07/2021   Lab Results  Component Value Date   HGBA1C 6.2 (H) 08/07/2021   HGBA1C 6.3 (A) 05/20/2021   HGBA1C 5.7 (H) 09/19/2020   HGBA1C 5.8 (A) 07/31/2020   HGBA1C 5.3 03/13/2020   Lab Results  Component Value Date   INSULIN 29.5 (H) 08/07/2021   Lab Results  Component Value Date   TSH 2.860 08/07/2021   Lab Results  Component Value Date   CHOL 247 (H) 08/07/2021   HDL 47 08/07/2021   LDLCALC 168 (H) 08/07/2021   LDLDIRECT 145.0 07/18/2019   TRIG 172 (H) 08/07/2021   CHOLHDL 6.1 (H) 09/19/2020   Lab Results  Component Value Date   VD25OH 45.2 08/07/2021   VD25OH 57.79 07/18/2019   VD25OH 29.8 (L) 01/11/2018   Lab Results  Component Value Date   WBC 10.4 08/07/2021   HGB 13.2 08/07/2021   HCT 40.2 08/07/2021   MCV 89 08/07/2021   PLT 284 08/07/2021   Lab Results  Component Value Date   IRON 54 10/06/2017   TIBC 304 10/06/2017   FERRITIN 151 (H) 10/06/2017   Attestation Statements:   Reviewed by clinician on day of visit: allergies, medications, problem list, medical history, surgical history, family history, social history, and previous encounter notes.  I spent 40 minutes  I, Trixie Dredge, am acting as transcriptionist for Dennard Nip, MD.  I have reviewed the above documentation for accuracy and completeness, and I agree with the above. -  Dennard Nip, MD

## 2021-08-21 NOTE — Addendum Note (Signed)
Addended by: Dennard Nip D on: 08/21/2021 11:14 AM   Modules accepted: Level of Service

## 2021-08-24 DIAGNOSIS — H6981 Other specified disorders of Eustachian tube, right ear: Secondary | ICD-10-CM | POA: Diagnosis not present

## 2021-08-25 ENCOUNTER — Encounter (HOSPITAL_BASED_OUTPATIENT_CLINIC_OR_DEPARTMENT_OTHER): Payer: Self-pay | Admitting: Orthopedic Surgery

## 2021-08-25 ENCOUNTER — Other Ambulatory Visit: Payer: Self-pay

## 2021-08-26 DIAGNOSIS — M79671 Pain in right foot: Secondary | ICD-10-CM | POA: Diagnosis not present

## 2021-08-27 ENCOUNTER — Other Ambulatory Visit: Payer: Self-pay

## 2021-08-27 ENCOUNTER — Ambulatory Visit (INDEPENDENT_AMBULATORY_CARE_PROVIDER_SITE_OTHER): Payer: BC Managed Care – PPO | Admitting: Psychiatry

## 2021-08-27 DIAGNOSIS — F3341 Major depressive disorder, recurrent, in partial remission: Secondary | ICD-10-CM

## 2021-08-27 NOTE — Progress Notes (Signed)
      [facility] Counselor/Therapist Progress Note  Patient ID: Gloria [name], MRN: [id number],    Date: 08/27/2021  Time Spent: 50 minutes start time 3:06 PM end time 3:56 PM  Treatment Type: Individual Therapy  Reported Symptoms: anxiety, grief issues  Mental Status Exam:  Appearance:   Well Groomed     Behavior:  Appropriate  Motor:  Normal  Speech/Language:   Normal Rate  Affect:  Appropriate  Mood:  normal  Thought process:  normal  Thought content:    WNL  Sensory/Perceptual disturbances:    WNL  Orientation:  oriented to person, place, time/date, and situation  Attention:  Good  Concentration:  Good  Memory:  WNL  Fund of knowledge:   Good  Insight:    Good  Judgment:   Good  Impulse Control:  Good   Risk Assessment: Danger to Self:  No Self-injurious Behavior: No Danger to Others: No Duty to Warn:no Physical Aggression / Violence:No  Access to Firearms a concern: No  Gang Involvement:No   Subjective: Patient was present for session. She reported that her provider, [name] NP made some med changes and she is doing much better even with the up coming surgery.  She got through her 90 day review was positive.  She is handling things better with her mother.  She is working her steps with her sponsor and that is good.  She stated that at this moment she is feeling better about things.  She went on to share that one of her issues is trying to figure out how to have healthy boundaries with her mother.  Taught patient the box theory and the importance of focusing on the things that she can control fix and change.  Discussed different ways that she can communicate her need for distance with her mother in a way that does not seem to create more complex but seems to resolve things.  Patient reported feeling positive about things from session and that she could implement them and continue moving things positively.  Interventions: Cognitive Behavioral Therapy,  Solution-Oriented/Positive Psychology, and Insight-Oriented  Diagnosis:   ICD-10-CM   1. MDD (major depressive disorder), recurrent, in partial remission ([place])  F33.41       Plan: Patient is to use CBT and coping skills to decrease depression symptoms.  Patient is to continue working on limit setting.  Patient is to Artist as a tool to help her manage situations appropriately.  Patient is to take medication as directed. Long-term goal: Develop a consistent positive self image Short-term goal: Formal realistic appropriate and attainable goals: Use positive self talk messages to build self-esteem-process areas of self-doubt  Lina Sayre, [facility]

## 2021-08-28 ENCOUNTER — Ambulatory Visit (INDEPENDENT_AMBULATORY_CARE_PROVIDER_SITE_OTHER): Payer: BC Managed Care – PPO | Admitting: Family Medicine

## 2021-08-28 ENCOUNTER — Encounter: Payer: Self-pay | Admitting: Family Medicine

## 2021-08-28 ENCOUNTER — Ambulatory Visit (HOSPITAL_COMMUNITY)
Admission: RE | Admit: 2021-08-28 | Discharge: 2021-08-28 | Disposition: A | Discharge status: Home / Self Care | Payer: BC Managed Care – PPO | Source: Ambulatory Visit | Attending: Internal Medicine | Admitting: Internal Medicine

## 2021-08-28 VITALS — BP 136/78 | HR 92 | Temp 98.6°F | Ht 65.0 in | Wt 274.8 lb

## 2021-08-28 DIAGNOSIS — K8020 Calculus of gallbladder without cholecystitis without obstruction: Secondary | ICD-10-CM | POA: Diagnosis not present

## 2021-08-28 DIAGNOSIS — F3340 Major depressive disorder, recurrent, in remission, unspecified: Secondary | ICD-10-CM

## 2021-08-28 DIAGNOSIS — E1169 Type 2 diabetes mellitus with other specified complication: Secondary | ICD-10-CM | POA: Diagnosis not present

## 2021-08-28 DIAGNOSIS — Z23 Encounter for immunization: Secondary | ICD-10-CM | POA: Diagnosis not present

## 2021-08-28 DIAGNOSIS — M5136 Other intervertebral disc degeneration, lumbar region: Secondary | ICD-10-CM | POA: Diagnosis not present

## 2021-08-28 DIAGNOSIS — Z0000 Encounter for general adult medical examination without abnormal findings: Secondary | ICD-10-CM

## 2021-08-28 DIAGNOSIS — K7581 Nonalcoholic steatohepatitis (NASH): Secondary | ICD-10-CM | POA: Diagnosis not present

## 2021-08-28 DIAGNOSIS — Z6841 Body Mass Index (BMI) 40.0 and over, adult: Secondary | ICD-10-CM

## 2021-08-28 DIAGNOSIS — M5126 Other intervertebral disc displacement, lumbar region: Secondary | ICD-10-CM | POA: Diagnosis not present

## 2021-08-28 MED ORDER — GADOBUTROL 1 MMOL/ML IV SOLN
10.0000 mL | Freq: Once | INTRAVENOUS | Status: AC | PRN
  Administered 2021-08-28: 10 mL via INTRAVENOUS

## 2021-08-28 NOTE — Progress Notes (Signed)

## 2021-08-28 NOTE — Progress Notes (Signed)
Subjective:     Gloria [name] is a 50 y.o. female and is here for a comprehensive physical exam.  Doing well for the most part.  Had to wear a boot on R foot.  Having surgery on R 5th digit next wk to remove mass.  Started seeing weight management.  Has had labs her first visit. Currently on an eating plan where she increases her carbs and is taking Ozempic.  Previously on Rybelsus which caused fibromyalgia flare and increased depression symptoms.  Depression currently stable on current meds.  States blood sugar has been controlled.  Social History   Socioeconomic History   Marital status: Divorced    Spouse name: Not on file   Number of children: 1   Years of education: 13   Highest education level: Not on file  Occupational History   Occupation: ACCOUNTING   Occupation: Animal nutritionist  Tobacco Use   Smoking status: Former    Packs/day: 1.00    Types: Cigarettes    Quit date: 01/26/2012    Years since quitting: 9.5   Smokeless tobacco: Never  Vaping Use   Vaping Use: Never used  Substance and Sexual Activity   Alcohol use: No    Alcohol/week: 0.0 standard drinks    Comment: History of alcohol abuse   Drug use: No   Sexual activity: Not Currently  Other Topics Concern   Not on file  Social History Narrative   Patient lives at home and works full time.   Education some college.   Right handed.   Caffeine: 2 cups daily.   Social Determinants of Health   Financial Resource Strain: Not on file  Food Insecurity: Not on file  Transportation Needs: Not on file  Physical Activity: Not on file  Stress: Not on file  Social Connections: Not on file  Intimate Partner Violence: Not on file   Health Maintenance  Topic Date Due   OPHTHALMOLOGY EXAM  Never done   HIV Screening  Never done   Hepatitis C Screening  Never done   Zoster Vaccines- Shingrix (1 of 2) Never done   PAP SMEAR-Modifier  10/21/2018   FOOT EXAM  04/10/2020   URINE MICROALBUMIN  03/13/2021    INFLUENZA VACCINE  06/22/2021   COVID-19 Vaccine (5 - Booster for [place] series) 07/24/2021   HEMOGLOBIN A1C  02/04/2022   MAMMOGRAM  07/03/2022   TETANUS/TDAP  10/27/2022   COLONOSCOPY (Pts 45-50yr Insurance coverage will need to be confirmed)  04/17/2025   HPV VACCINES  Aged Out    The following portions of the patient's history were reviewed and updated as appropriate: allergies, current medications, past family history, past medical history, past social history, past surgical history, and problem list.  Review of Systems Pertinent items noted in HPI and remainder of comprehensive ROS otherwise negative.   Objective:    BP 136/78 (BP Location: Right Wrist, Patient Position: Sitting, Cuff Size: Normal)   Pulse 92   Temp 98.6 F (37 C) (Oral)   Ht 5' 5"  (1.651 m)   Wt 274 lb 12.8 oz (124.6 kg)   LMP 04/25/2013   SpO2 97%   BMI 45.73 kg/m  General appearance: alert, cooperative, and no distress Head: Normocephalic, without obvious abnormality, atraumatic Eyes: conjunctivae/corneas clear. PERRL, EOM's intact. Fundi benign. Ears: normal TM's and external ear canals both ears Nose: Nares normal. Septum midline. Mucosa normal. No drainage or sinus tenderness. Throat: lips, mucosa, and tongue normal; teeth and gums normal Neck: no adenopathy,  no carotid bruit, no JVD, supple, symmetrical, trachea midline, and thyroid not enlarged, symmetric, no tenderness/mass/nodules Lungs: clear to auscultation bilaterally Heart: regular rate and rhythm, S1, S2 normal, no murmur, click, rub or gallop Abdomen: soft, non-tender; bowel sounds normal; no masses,  no organomegaly Extremities: extremities normal, atraumatic, no cyanosis or edema  Growth on R 5th digit Pulses: 2+ and symmetric Skin: Skin color, texture, turgor normal. No rashes or lesions Lymph nodes: Cervical, supraclavicular, and axillary nodes normal. Neurologic: Alert and oriented X 3, normal strength and tone. Normal symmetric  reflexes. Normal coordination and gait   Diabetic Foot Exam - Simple   Simple Foot Form  08/28/2021  3:31 PM  Visual Inspection No deformities, no ulcerations, no other skin breakdown bilaterally: Yes Sensation Testing Intact to touch and monofilament testing bilaterally: Yes Pulse Check Posterior Tibialis and Dorsalis pulse intact bilaterally: Yes Comments Small callus on bilateral heels.     Assessment:    Healthy female exam.      Plan:    Anticipatory guidance given including wearing seatbelts, smoke detectors in the home, increasing physical activity, increasing p.o. intake of water and vegetables. -Labs reviewed from 08/07/2021 -Colonoscopy up-to-date.  Done 04/17/2020.  Repeat in 10 years. -Mammogram up-to-date done 07/03/2021 -Discussed immunizations -Given handout -Next CPE in 1 year See After Visit Summary for Counseling Recommendations   Need for influenza vaccination  - Plan: Flu Vaccine QUAD 6+ mos PF IM (Fluarix Quad PF)  Type 2 diabetes mellitus with other specified complication, without long-term current use of insulin (HCC) -Hemoglobin A1c 6.2% on 08/07/2021 -Continue current medications including Ozempic 0.5 MG SQ weekly, glipizide 5 mg daily -Continue lifestyle modifications -continue follow-with weight management  -foot exam done this visit  Recurrent major depression in remission ([place]) -Stable -Continue follow-up with [facility] -Continue current medicines including Rexulti, Trileptal  Class 3 severe obesity due to excess calories with serious comorbidity and body mass index (BMI) of 45.0 to 49.9 in adult (HCC) -BMI 45.73 -Continue lifestyle modifications -Continue working with weight management -Continue Ozempic 0.5 mg weekly  Follow-up in the next few months  Grier Mitts, MD

## 2021-08-29 ENCOUNTER — Encounter: Payer: Self-pay | Admitting: Family Medicine

## 2021-08-31 ENCOUNTER — Telehealth: Payer: Self-pay | Admitting: Internal Medicine

## 2021-08-31 ENCOUNTER — Encounter: Payer: Self-pay | Admitting: Family Medicine

## 2021-08-31 NOTE — Telephone Encounter (Signed)
Inbound call from pt requesting a call back stating that she had an MRI done but pt wanted to know if she needed a follow up appt. Please advise. Thank you.

## 2021-08-31 NOTE — Telephone Encounter (Signed)
See result note.  

## 2021-09-02 DIAGNOSIS — M79671 Pain in right foot: Secondary | ICD-10-CM | POA: Diagnosis not present

## 2021-09-03 ENCOUNTER — Other Ambulatory Visit: Payer: Self-pay

## 2021-09-03 ENCOUNTER — Ambulatory Visit (HOSPITAL_BASED_OUTPATIENT_CLINIC_OR_DEPARTMENT_OTHER): Payer: BC Managed Care – PPO | Admitting: Anesthesiology

## 2021-09-03 ENCOUNTER — Encounter (HOSPITAL_BASED_OUTPATIENT_CLINIC_OR_DEPARTMENT_OTHER): Payer: Self-pay | Admitting: Orthopedic Surgery

## 2021-09-03 ENCOUNTER — Encounter (HOSPITAL_BASED_OUTPATIENT_CLINIC_OR_DEPARTMENT_OTHER)
Admission: RE | Disposition: A | Discharge status: Home / Self Care | Payer: Self-pay | Source: Home / Self Care | Attending: Orthopedic Surgery

## 2021-09-03 ENCOUNTER — Ambulatory Visit (HOSPITAL_BASED_OUTPATIENT_CLINIC_OR_DEPARTMENT_OTHER)
Admission: RE | Admit: 2021-09-03 | Discharge: 2021-09-03 | Disposition: A | Discharge status: Home / Self Care | Payer: BC Managed Care – PPO | Attending: Orthopedic Surgery | Admitting: Orthopedic Surgery

## 2021-09-03 DIAGNOSIS — Z87891 Personal history of nicotine dependence: Secondary | ICD-10-CM | POA: Insufficient documentation

## 2021-09-03 DIAGNOSIS — D485 Neoplasm of uncertain behavior of skin: Secondary | ICD-10-CM | POA: Insufficient documentation

## 2021-09-03 DIAGNOSIS — E1122 Type 2 diabetes mellitus with diabetic chronic kidney disease: Secondary | ICD-10-CM | POA: Insufficient documentation

## 2021-09-03 DIAGNOSIS — E559 Vitamin D deficiency, unspecified: Secondary | ICD-10-CM | POA: Diagnosis not present

## 2021-09-03 DIAGNOSIS — Z885 Allergy status to narcotic agent status: Secondary | ICD-10-CM | POA: Diagnosis not present

## 2021-09-03 DIAGNOSIS — Z6841 Body Mass Index (BMI) 40.0 and over, adult: Secondary | ICD-10-CM | POA: Diagnosis not present

## 2021-09-03 DIAGNOSIS — E785 Hyperlipidemia, unspecified: Secondary | ICD-10-CM | POA: Insufficient documentation

## 2021-09-03 DIAGNOSIS — Z881 Allergy status to other antibiotic agents status: Secondary | ICD-10-CM | POA: Insufficient documentation

## 2021-09-03 DIAGNOSIS — D481 Neoplasm of uncertain behavior of connective and other soft tissue: Secondary | ICD-10-CM | POA: Diagnosis not present

## 2021-09-03 DIAGNOSIS — Z886 Allergy status to analgesic agent status: Secondary | ICD-10-CM | POA: Diagnosis not present

## 2021-09-03 DIAGNOSIS — M25841 Other specified joint disorders, right hand: Secondary | ICD-10-CM | POA: Diagnosis not present

## 2021-09-03 DIAGNOSIS — E669 Obesity, unspecified: Secondary | ICD-10-CM | POA: Insufficient documentation

## 2021-09-03 DIAGNOSIS — Z833 Family history of diabetes mellitus: Secondary | ICD-10-CM | POA: Diagnosis not present

## 2021-09-03 DIAGNOSIS — Z888 Allergy status to other drugs, medicaments and biological substances status: Secondary | ICD-10-CM | POA: Diagnosis not present

## 2021-09-03 DIAGNOSIS — R2231 Localized swelling, mass and lump, right upper limb: Secondary | ICD-10-CM | POA: Diagnosis not present

## 2021-09-03 DIAGNOSIS — K219 Gastro-esophageal reflux disease without esophagitis: Secondary | ICD-10-CM | POA: Diagnosis not present

## 2021-09-03 HISTORY — PX: EXCISION METACARPAL MASS: SHX6372

## 2021-09-03 LAB — GLUCOSE, CAPILLARY
Glucose-Capillary: 145 mg/dL — ABNORMAL HIGH (ref 70–99)
Glucose-Capillary: 150 mg/dL — ABNORMAL HIGH (ref 70–99)

## 2021-09-03 SURGERY — EXCISION METACARPAL MASS
Anesthesia: Regional | Site: Hand | Laterality: Right

## 2021-09-03 MED ORDER — CEFAZOLIN IN SODIUM CHLORIDE 3-0.9 GM/100ML-% IV SOLN
3.0000 g | INTRAVENOUS | Status: AC
  Administered 2021-09-03: 3 g via INTRAVENOUS

## 2021-09-03 MED ORDER — AMISULPRIDE (ANTIEMETIC) 5 MG/2ML IV SOLN: 10.0000 mg | Freq: Once | INTRAVENOUS | Status: DC | PRN

## 2021-09-03 MED ORDER — BUPIVACAINE HCL (PF) 0.25 % IJ SOLN
INTRAMUSCULAR | Status: DC | PRN
  Administered 2021-09-03: 6 mL
  Administered 2021-09-03: 8 mL

## 2021-09-03 MED ORDER — ACETAMINOPHEN 10 MG/ML IV SOLN: 1000.0000 mg | Freq: Once | INTRAVENOUS | Status: DC | PRN

## 2021-09-03 MED ORDER — 0.9 % SODIUM CHLORIDE (POUR BTL) OPTIME
TOPICAL | Status: DC | PRN
  Administered 2021-09-03: 100 mL

## 2021-09-03 MED ORDER — PROPOFOL 500 MG/50ML IV EMUL
INTRAVENOUS | Status: AC
  Filled 2021-09-03: qty 50

## 2021-09-03 MED ORDER — ONDANSETRON HCL 4 MG/2ML IJ SOLN
INTRAMUSCULAR | Status: AC
  Filled 2021-09-03: qty 4

## 2021-09-03 MED ORDER — LACTATED RINGERS IV SOLN: INTRAVENOUS | Status: DC

## 2021-09-03 MED ORDER — PROMETHAZINE HCL 25 MG/ML IJ SOLN: 6.2500 mg | INTRAMUSCULAR | Status: DC | PRN

## 2021-09-03 MED ORDER — MIDAZOLAM HCL 2 MG/2ML IJ SOLN
2.0000 mg | Freq: Once | INTRAMUSCULAR | Status: AC
  Administered 2021-09-03: 2 mg via INTRAVENOUS

## 2021-09-03 MED ORDER — CEFAZOLIN IN SODIUM CHLORIDE 3-0.9 GM/100ML-% IV SOLN
INTRAVENOUS | Status: AC
  Filled 2021-09-03: qty 100

## 2021-09-03 MED ORDER — MIDAZOLAM HCL 2 MG/2ML IJ SOLN
INTRAMUSCULAR | Status: AC
  Filled 2021-09-03: qty 2

## 2021-09-03 MED ORDER — FENTANYL CITRATE (PF) 100 MCG/2ML IJ SOLN: 25.0000 ug | INTRAMUSCULAR | Status: DC | PRN

## 2021-09-03 MED ORDER — DEXAMETHASONE SODIUM PHOSPHATE 10 MG/ML IJ SOLN
INTRAMUSCULAR | Status: AC
  Filled 2021-09-03: qty 1

## 2021-09-03 MED ORDER — PROPOFOL 500 MG/50ML IV EMUL
INTRAVENOUS | Status: DC | PRN
  Administered 2021-09-03: 75 ug/kg/min via INTRAVENOUS

## 2021-09-03 MED ORDER — KETOROLAC TROMETHAMINE 30 MG/ML IJ SOLN
30.0000 mg | Freq: Once | INTRAMUSCULAR | Status: AC
  Administered 2021-09-03: 30 mg via INTRAVENOUS

## 2021-09-03 MED ORDER — LIDOCAINE HCL (PF) 1 % IJ SOLN
INTRAMUSCULAR | Status: AC
  Filled 2021-09-03: qty 30

## 2021-09-03 MED ORDER — PROPOFOL 10 MG/ML IV BOLUS
INTRAVENOUS | Status: DC | PRN
  Administered 2021-09-03: 40 mg via INTRAVENOUS
  Administered 2021-09-03 (×3): 20 mg via INTRAVENOUS

## 2021-09-03 MED ORDER — KETOROLAC TROMETHAMINE 30 MG/ML IJ SOLN
INTRAMUSCULAR | Status: AC
  Filled 2021-09-03: qty 1

## 2021-09-03 MED ORDER — LIDOCAINE-EPINEPHRINE (PF) 1.5 %-1:200000 IJ SOLN
INTRAMUSCULAR | Status: DC | PRN
  Administered 2021-09-03: 30 mL via PERINEURAL

## 2021-09-03 MED ORDER — DEXAMETHASONE SODIUM PHOSPHATE 10 MG/ML IJ SOLN
INTRAMUSCULAR | Status: DC | PRN
Start: 2021-09-03 — End: 2021-09-03
  Administered 2021-09-03: 4 mg via INTRAVENOUS

## 2021-09-03 MED ORDER — LIDOCAINE HCL (PF) 1 % IJ SOLN
INTRAMUSCULAR | Status: DC | PRN
  Administered 2021-09-03: 5 mL

## 2021-09-03 MED ORDER — TRAMADOL HCL 50 MG PO TABS: 50.0000 mg | ORAL_TABLET | Freq: Four times a day (QID) | ORAL | 0 refills | Status: DC | PRN

## 2021-09-03 MED ORDER — ONDANSETRON HCL 4 MG/2ML IJ SOLN
INTRAMUSCULAR | Status: DC | PRN
  Administered 2021-09-03: 4 mg via INTRAVENOUS

## 2021-09-03 MED ORDER — FENTANYL CITRATE (PF) 100 MCG/2ML IJ SOLN
INTRAMUSCULAR | Status: AC
  Filled 2021-09-03: qty 2

## 2021-09-03 SURGICAL SUPPLY — 54 items
APL PRP STRL LF DISP 70% ISPRP (MISCELLANEOUS) ×1
BLADE MINI RND TIP GREEN BEAV (BLADE) IMPLANT
BLADE SURG 15 STRL LF DISP TIS (BLADE) ×1 IMPLANT
BLADE SURG 15 STRL SS (BLADE) ×2
BNDG CMPR 9X4 STRL LF SNTH (GAUZE/BANDAGES/DRESSINGS) ×1
BNDG COHESIVE 1X5 TAN STRL LF (GAUZE/BANDAGES/DRESSINGS) ×1 IMPLANT
BNDG COHESIVE 2X5 TAN ST LF (GAUZE/BANDAGES/DRESSINGS) IMPLANT
BNDG COHESIVE 3X5 TAN ST LF (GAUZE/BANDAGES/DRESSINGS) IMPLANT
BNDG ESMARK 4X9 LF (GAUZE/BANDAGES/DRESSINGS) ×1 IMPLANT
BNDG GAUZE ELAST 4 BULKY (GAUZE/BANDAGES/DRESSINGS) IMPLANT
CHLORAPREP W/TINT 26 (MISCELLANEOUS) ×2 IMPLANT
CORD BIPOLAR FORCEPS 12FT (ELECTRODE) ×2 IMPLANT
COVER BACK TABLE 60X90IN (DRAPES) ×2 IMPLANT
COVER MAYO STAND STRL (DRAPES) ×2 IMPLANT
CUFF TOURN SGL QUICK 18X4 (TOURNIQUET CUFF) ×1 IMPLANT
DECANTER SPIKE VIAL GLASS SM (MISCELLANEOUS) IMPLANT
DRAIN PENROSE 1/2X12 LTX STRL (WOUND CARE) IMPLANT
DRAPE EXTREMITY T 121X128X90 (DISPOSABLE) ×2 IMPLANT
DRAPE SURG 17X23 STRL (DRAPES) ×2 IMPLANT
GAUZE SPONGE 4X4 12PLY STRL (GAUZE/BANDAGES/DRESSINGS) ×2 IMPLANT
GAUZE XEROFORM 1X8 LF (GAUZE/BANDAGES/DRESSINGS) ×2 IMPLANT
GLOVE SRG 8 PF TXTR STRL LF DI (GLOVE) IMPLANT
GLOVE SURG ENC MOIS LTX SZ7 (GLOVE) ×1 IMPLANT
GLOVE SURG ENC MOIS LTX SZ7.5 (GLOVE) ×1 IMPLANT
GLOVE SURG ORTHO LTX SZ8 (GLOVE) ×2 IMPLANT
GLOVE SURG UNDER POLY LF SZ7 (GLOVE) ×2 IMPLANT
GLOVE SURG UNDER POLY LF SZ8 (GLOVE) ×2
GLOVE SURG UNDER POLY LF SZ8.5 (GLOVE) ×2 IMPLANT
GOWN STRL REUS W/ TWL LRG LVL3 (GOWN DISPOSABLE) ×1 IMPLANT
GOWN STRL REUS W/TWL LRG LVL3 (GOWN DISPOSABLE) ×2
GOWN STRL REUS W/TWL XL LVL3 (GOWN DISPOSABLE) ×3 IMPLANT
NDL PRECISIONGLIDE 27X1.5 (NEEDLE) IMPLANT
NDL SAFETY ECLIPSE 18X1.5 (NEEDLE) ×1 IMPLANT
NEEDLE HYPO 18GX1.5 SHARP (NEEDLE) ×2
NEEDLE PRECISIONGLIDE 27X1.5 (NEEDLE) ×2 IMPLANT
NS IRRIG 1000ML POUR BTL (IV SOLUTION) ×2 IMPLANT
PACK BASIN DAY SURGERY FS (CUSTOM PROCEDURE TRAY) ×2 IMPLANT
PAD CAST 3X4 CTTN HI CHSV (CAST SUPPLIES) IMPLANT
PADDING CAST ABS 3INX4YD NS (CAST SUPPLIES)
PADDING CAST ABS 4INX4YD NS (CAST SUPPLIES) ×1
PADDING CAST ABS COTTON 3X4 (CAST SUPPLIES) IMPLANT
PADDING CAST ABS COTTON 4X4 ST (CAST SUPPLIES) ×1 IMPLANT
PADDING CAST COTTON 3X4 STRL (CAST SUPPLIES)
SPLINT FINGER 3.25 911903 (SOFTGOODS) ×1 IMPLANT
SPLINT PLASTER CAST XFAST 3X15 (CAST SUPPLIES) IMPLANT
SPLINT PLASTER XTRA FASTSET 3X (CAST SUPPLIES)
STOCKINETTE 4X48 STRL (DRAPES) ×2 IMPLANT
SUT ETHILON 4 0 PS 2 18 (SUTURE) ×2 IMPLANT
SUT ETHILON 5 0 PS 2 18 (SUTURE) ×1 IMPLANT
SUT VIC AB 4-0 P2 18 (SUTURE) IMPLANT
SYR BULB EAR ULCER 3OZ GRN STR (SYRINGE) ×2 IMPLANT
SYR CONTROL 10ML LL (SYRINGE) ×1 IMPLANT
TOWEL GREEN STERILE FF (TOWEL DISPOSABLE) ×4 IMPLANT
UNDERPAD 30X36 HEAVY ABSORB (UNDERPADS AND DIAPERS) ×2 IMPLANT

## 2021-09-03 NOTE — Brief Op Note (Signed)
09/03/2021  9:55 AM  PATIENT:  Nira Conn R Kovacich  50 y.o. female  PRE-OPERATIVE DIAGNOSIS:  MASS RIGHT SMALL FINGER  POST-OPERATIVE DIAGNOSIS:  MASS RIGHT SMALL FINGER  PROCEDURE:  Procedure(s): EXCISION METACARPAL MASS RIGHT SMALL FINGER (Right)  SURGEON:  Surgeon(s) and Role:    * Daryll Brod, MD - Primary    * Leanora Cover, MD - Assisting  PHYSICIAN ASSISTANT:   ASSISTANTS: K Misaki Sozio,MD   ANESTHESIA:   local, regional, and IV sedation  EBL:  1 mL   BLOOD ADMINISTERED:none  DRAINS: none   LOCAL MEDICATIONS USED:  BUPIVICAINE and Xylocaine  SPECIMEN:  Excision  DISPOSITION OF SPECIMEN:  PATHOLOGY  COUNTS:  YES  TOURNIQUET:   Total Tourniquet Time Documented: Forearm (Right) - 46 minutes Total: Forearm (Right) - 46 minutes   DICTATION: .Viviann Spare Dictation  PLAN OF CARE: Discharge to home after PACU  PATIENT DISPOSITION:  PACU - hemodynamically stable.

## 2021-09-03 NOTE — Anesthesia Preprocedure Evaluation (Addendum)
Anesthesia Evaluation  Patient identified by MRN, date of birth, ID band Patient awake    Reviewed: Allergy & Precautions, NPO status , Patient's Chart, lab work & pertinent test results  Airway Mallampati: III  TM Distance: >3 FB Neck ROM: Full    Dental no notable dental hx.    Pulmonary sleep apnea and Continuous Positive Airway Pressure Ventilation , former smoker,    Pulmonary exam normal breath sounds clear to auscultation       Cardiovascular negative cardio ROS Normal cardiovascular exam Rhythm:Regular Rate:Normal  ECG: rate 87   Neuro/Psych  Headaches, PSYCHIATRIC DISORDERS Anxiety Depression    GI/Hepatic GERD  Medicated and Controlled,(+)     substance abuse  , Hepatitis -  Endo/Other  diabetes, Oral Hypoglycemic AgentsMorbid obesity  Renal/GU negative Renal ROS     Musculoskeletal  (+) Arthritis , Fibromyalgia -Chronic back pain   Abdominal (+) + obese,   Peds  Hematology HLD   Anesthesia Other Findings MASS RIGHT SMALL FINGER  Reproductive/Obstetrics                            Anesthesia Physical Anesthesia Plan  ASA: 3  Anesthesia Plan: Regional   Post-op Pain Management:    Induction: Intravenous  PONV Risk Score and Plan: 2 and Ondansetron, Dexamethasone, Propofol infusion, Midazolam and Treatment may vary due to age or medical condition  Airway Management Planned: Simple Face Mask  Additional Equipment:   Intra-op Plan:   Post-operative Plan:   Informed Consent: I have reviewed the patients History and Physical, chart, labs and discussed the procedure including the risks, benefits and alternatives for the proposed anesthesia with the patient or authorized representative who has indicated his/her understanding and acceptance.     Dental advisory given  Plan Discussed with: CRNA  Anesthesia Plan Comments:        Anesthesia Quick Evaluation

## 2021-09-03 NOTE — H&P (Signed)
Gloria [name] is an 50 y.o. female.   Chief Complaint: mass right smALL FINGER HPI: Gloria [name] is a 50 year old right-hand-dominant female comes in with a mass on her right small finger with pain swelling discoloration. This been present for a year. She states that she has a feeling of pressure 2/10. She she states that it is gotten slightly worse. Recalls no history of injury. The mass is about the middle phalanx out to the distal phalanx he has not had any treatment for this. Nothing makes it better or worse. She was referred for ultrasound. This has been done revealing multiple hypoechoic masses along the anterior posterior and lateral aspect of the fifth digit corresponding to the areas of concern with considerations of either fibromas or to note tenosynovial giant cell tumor. This is read out by Dr. Yancey [name]. These measure 0.8 x 0.4 x 0.6 cm on the anterior aspect on the posterior aspect it measures 1.7 x 2.8 x 0.9. On the lateral aspect at 6.7 x 2.4 x 0.7 cm She had an ultrasound done and was referred for MRI to evaluate whether there is 1 mass with 3 lobes or 2 separate masses. The MRI has been done and read out by Dr. Raechel [name] as a single mass measuring 1.5 x 1.4 x 1.2 cm over the palmar aspect and then goes dorsally measuring 5 x 6 mm. 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 This is been present for a year. It is giving her a feeling of pressure only. She has no history of injury. She has a history of diabetes no history of thyroid problems arthritis or gout. Family history is positive diabetes and arthritis Past Medical History:  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   DDD  (degenerative disc disease), lumbar    Depression    Diabetes ([place])    Diverticulosis    Drug use    Elevated LFTs    Fibromyalgia    GERD (gastroesophageal reflux disease)    History of ETOH abuse    Hyperlipidemia    Hyperplastic colon polyp    Internal hemorrhoids    Migraines    NAFLD (nonalcoholic fatty liver disease)    Obese    Obstructive sleep apnea 02/25/2015   not diagnosed per pt 04-03-20   Osteoarthritis    lower back- DDD   Periodic limb movement disorder 02/25/2014   Pre-diabetes    pt taking metformin   RLS (restless legs syndrome)    Sleep apnea    Smoker    Snoring    Substance abuse ([place])    Swallowing difficulty    Vitamin B12 deficiency    Vitamin D deficiency     Past Surgical History:  Procedure Laterality Date   CERVICAL FUSION     COLONOSCOPY     CYSTOSCOPY  06/06/2013   Procedure: CYSTOSCOPY;  Surgeon: Terrance Mass, MD;  Location: [facility];  Service: Gynecology;;   INTRAUTERINE DEVICE INSERTION  08/09/2008   PARAGUARD- removed   kidney surgery  1995  abcess   LAPAROSCOPY N/A 06/06/2013   Procedure: LAPAROSCOPY DIAGNOSTIC;  Surgeon: Terrance Mass, MD;  Location: [facility];  Service: Gynecology;  Laterality: N/A;   LAPAROTOMY  06/06/2013   Procedure: EXPLORATORY LAPAROTOMY;  Surgeon: Terrance Mass, MD;  Location: [facility];  Service: Gynecology;;   LIVER BIOPSY     radial tunnel Bilateral 2005   VAGINAL HYSTERECTOMY Left 06/06/2013   Procedure: Vaginal Hysterectomy with Patiial Right Salpingectomy;  Surgeon: Terrance Mass, MD;  Location: [facility];  Service: Gynecology;  Laterality: Left;    Family History  Problem Relation Age of Onset   Cancer Mother    Breast cancer Mother    Depression Mother    Anxiety disorder Mother    Obesity Mother    Sleep apnea Mother    Obesity Father    Anxiety disorder Father    Cancer Father    Hyperlipidemia Father    Colon polyps Father    Diabetes Father    Cirrhosis Father    Liver cancer  Father    Liver disease Father    Alcoholism Father    Depression Father    Alcohol abuse Father    Sleep apnea Father    Irritable bowel syndrome Sister    Sleep apnea Brother    Breast cancer Maternal Aunt    Alcoholism Maternal Aunt    Alcoholism Maternal Grandmother    Alcoholism Maternal Grandfather    Breast cancer Paternal Grandmother    Colon cancer Paternal Grandmother    Esophageal cancer Paternal Grandfather    Stomach cancer Paternal Grandfather    Alcoholism Paternal Grandfather    Rectal cancer Neg Hx    Social History:  reports that she quit smoking about 9 years ago. Her smoking use included cigarettes. She smoked an average of 1 pack per day. She has never used smokeless tobacco. She reports that she does not drink alcohol and does not use drugs.  Allergies:  Allergies  Allergen Reactions   Benadryl [Diphenhydramine Hcl] Hives   Ropinirole Other (See Comments)   Levofloxacin Other (See Comments)   Aspirin    Codeine    Doxycycline    Metformin And Related     Blurred vision   Nuvigil [Armodafinil]     migraines    Requip [Ropinirole Hcl]     Increased headache   Semaglutide Other (See Comments)    Rybelsus caused depression and fibromyalgia flair.   Wellbutrin [Bupropion]    Zolpidem     No medications prior to admission.    No results found for this or any previous visit (from the past 48 hour(s)).  No results found.   Pertinent items are noted in HPI.  Height 5' 5"  (1.651 m), weight 124.7 kg, last menstrual period 04/25/2013.  General appearance: alert, cooperative, and appears stated age Head: Normocephalic, without obvious abnormality Neck: no JVD Resp: clear to auscultation bilaterally Cardio: regular rate and rhythm, S1, S2 normal, no murmur, click, rub or gallop GI: soft, non-tender; bowel sounds normal; no masses,  no organomegaly Extremities:  mass right small finger Pulses: 2+ and symmetric Skin: Skin color, texture, turgor  normal. No rashes or lesions Neurologic: Grossly normal Incision/Wound: na  Assessment/Plan Assessment:  1. Mass of finger of right hand    Plan: She would like to have this excised. Pre-. Postoperative course been discussed along with risk and complications. She is aware there is no guarantee to the surgery possibility of infection recurrence injury to arteries  nerves tendons incomplete relief symptoms dystrophy. She is scheduled for excisional biopsy mass right small finger as an outpatient under regional anesthesia.  Daryll Brod 09/03/2021, 5:17 AM

## 2021-09-03 NOTE — Op Note (Signed)
I assisted Surgeon(s) and Role:    * Daryll Brod, MD - Primary    * Leanora Cover, MD - Assisting on the Procedure(s): [place] on 09/03/2021.  I provided assistance on this case as follows: retraction soft tissues, incision, mobilization of mass.  Electronically signed by: Leanora Cover, MD Date: 09/03/2021 Time: 9:51 AM

## 2021-09-03 NOTE — Anesthesia Postprocedure Evaluation (Signed)
Anesthesia Post Note  Patient: Gloria [name]  Procedure(s) Performed: EXCISION METACARPAL MASS RIGHT SMALL FINGER (Right: Hand)     Patient location during evaluation: PACU Anesthesia Type: Regional Level of consciousness: awake and alert Pain management: pain level controlled Vital Signs Assessment: post-procedure vital signs reviewed and stable Respiratory status: spontaneous breathing, nonlabored ventilation and respiratory function stable Cardiovascular status: blood pressure returned to baseline and stable Postop Assessment: no apparent nausea or vomiting Anesthetic complications: no   No notable events documented.  Last Vitals:  Vitals:   09/03/21 1015 09/03/21 1045  BP: 138/73 133/79  Pulse: 88 87  Resp: 14 18  Temp:  36.5 C  SpO2: 93% 93%    Last Pain:  Vitals:   09/03/21 1045  TempSrc:   PainSc: 0-No pain                 Lynda Rainwater

## 2021-09-03 NOTE — Discharge Instructions (Addendum)
[facility] Instructions Hand Surgery  Wound Care: Keep your hand elevated above the level of your heart.  Do not allow it to dangle by your side.  Keep the dressing dry and do not remove it unless your doctor advises you to do so.  He will usually change it at the time of your post-op visit.  Moving your fingers is advised to stimulate circulation but will depend on the site of your surgery.  If you have a splint applied, your doctor will advise you regarding movement.  Activity: Do not drive or operate machinery today.  Rest today and then you may return to your normal activity and work as indicated by your physician.  Diet:  Drink liquids today or eat a light diet.  You may resume a regular diet tomorrow.    General expectations: Pain for two to three days. Fingers may become slightly swollen.  Call your doctor if any of the following occur: Severe pain not relieved by pain medication. Elevated temperature. Dressing soaked with blood. Inability to move fingers. White or bluish color to fingers.  Post Anesthesia Home Care Instructions  Activity: Get plenty of rest for the remainder of the day. A responsible individual must stay with you for 24 hours following the procedure.  For the next 24 hours, DO NOT: -Drive a car -Paediatric nurse -Drink alcoholic beverages -Take any medication unless instructed by your physician -Make any legal decisions or sign important papers.  Meals: Start with liquid foods such as gelatin or soup. Progress to regular foods as tolerated. Avoid greasy, spicy, heavy foods. If nausea and/or vomiting occur, drink only clear liquids until the nausea and/or vomiting subsides. Call your physician if vomiting continues.  Special Instructions/Symptoms: Your throat may feel dry or sore from the anesthesia or the breathing tube placed in your throat during surgery. If this causes discomfort, gargle with warm salt water. The discomfort should disappear within 24  hours.  If you had a scopolamine patch placed behind your ear for the management of post- operative nausea and/or vomiting:  1. The medication in the patch is effective for 72 hours, after which it should be removed.  Wrap patch in a tissue and discard in the trash. Wash hands thoroughly with soap and water. 2. You may remove the patch earlier than 72 hours if you experience unpleasant side effects which may include dry mouth, dizziness or visual disturbances. 3. Avoid touching the patch. Wash your hands with soap and water after contact with the patch.    Post Anesthesia Home Care Instructions  Activity: Get plenty of rest for the remainder of the day. A responsible individual must stay with you for 24 hours following the procedure.  For the next 24 hours, DO NOT: -Drive a car -Paediatric nurse -Drink alcoholic beverages -Take any medication unless instructed by your physician -Make any legal decisions or sign important papers.  Meals: Start with liquid foods such as gelatin or soup. Progress to regular foods as tolerated. Avoid greasy, spicy, heavy foods. If nausea and/or vomiting occur, drink only clear liquids until the nausea and/or vomiting subsides. Call your physician if vomiting continues.  Special Instructions/Symptoms: Your throat may feel dry or sore from the anesthesia or the breathing tube placed in your throat during surgery. If this causes discomfort, gargle with warm salt water. The discomfort should disappear within 24 hours.  If you had a scopolamine patch placed behind your ear for the management of post- operative nausea and/or vomiting:  1.  The medication in the patch is effective for 72 hours, after which it should be removed.  Wrap patch in a tissue and discard in the trash. Wash hands thoroughly with soap and water. 2. You may remove the patch earlier than 72 hours if you experience unpleasant side effects which may include dry mouth, dizziness or visual  disturbances. 3. Avoid touching the patch. Wash your hands with soap and water after contact with the patch.    Post Anesthesia Home Care Instructions  Activity: Get plenty of rest for the remainder of the day. A responsible individual must stay with you for 24 hours following the procedure.  For the next 24 hours, DO NOT: -Drive a car -Paediatric nurse -Drink alcoholic beverages -Take any medication unless instructed by your physician -Make any legal decisions or sign important papers.  Meals: Start with liquid foods such as gelatin or soup. Progress to regular foods as tolerated. Avoid greasy, spicy, heavy foods. If nausea and/or vomiting occur, drink only clear liquids until the nausea and/or vomiting subsides. Call your physician if vomiting continues.  Special Instructions/Symptoms: Your throat may feel dry or sore from the anesthesia or the breathing tube placed in your throat during surgery. If this causes discomfort, gargle with warm salt water. The discomfort should disappear within 24 hours.  If you had a scopolamine patch placed behind your ear for the management of post- operative nausea and/or vomiting:  1. The medication in the patch is effective for 72 hours, after which it should be removed.  Wrap patch in a tissue and discard in the trash. Wash hands thoroughly with soap and water. 2. You may remove the patch earlier than 72 hours if you experience unpleasant side effects which may include dry mouth, dizziness or visual disturbances. 3. Avoid touching the patch. Wash your hands with soap and water after contact with the patch.    Post Anesthesia Home Care Instructions  Activity: Get plenty of rest for the remainder of the day. A responsible individual must stay with you for 24 hours following the procedure.  For the next 24 hours, DO NOT: -Drive a car -Paediatric nurse -Drink alcoholic beverages -Take any medication unless instructed by your physician -Make any  legal decisions or sign important papers.  Meals: Start with liquid foods such as gelatin or soup. Progress to regular foods as tolerated. Avoid greasy, spicy, heavy foods. If nausea and/or vomiting occur, drink only clear liquids until the nausea and/or vomiting subsides. Call your physician if vomiting continues.  Special Instructions/Symptoms: Your throat may feel dry or sore from the anesthesia or the breathing tube placed in your throat during surgery. If this causes discomfort, gargle with warm salt water. The discomfort should disappear within 24 hours.  If you had a scopolamine patch placed behind your ear for the management of post- operative nausea and/or vomiting:  1. The medication in the patch is effective for 72 hours, after which it should be removed.  Wrap patch in a tissue and discard in the trash. Wash hands thoroughly with soap and water. 2. You may remove the patch earlier than 72 hours if you experience unpleasant side effects which may include dry mouth, dizziness or visual disturbances. 3. Avoid touching the patch. Wash your hands with soap and water after contact with the patch.       Regional Anesthesia Blocks  1. Numbness or the inability to move the "blocked" extremity may last from 3-48 hours after placement. The length of time depends  on the medication injected and your individual response to the medication. If the numbness is not going away after 48 hours, call your surgeon.  2. The extremity that is blocked will need to be protected until the numbness is gone and the  Strength has returned. Because you cannot feel it, you will need to take extra care to avoid injury. Because it may be weak, you may have difficulty moving it or using it. You may not know what position it is in without looking at it while the block is in effect.  3. For blocks in the legs and feet, returning to weight bearing and walking needs to be done carefully. You will need to wait until the  numbness is entirely gone and the strength has returned. You should be able to move your leg and foot normally before you try and bear weight or walk. You will need someone to be with you when you first try to ensure you do not fall and possibly risk injury.  4. Bruising and tenderness at the needle site are common side effects and will resolve in a few days.  5. Persistent numbness or new problems with movement should be communicated to the surgeon or the [facility] [phone] [place] [phone]).

## 2021-09-03 NOTE — Op Note (Signed)
NAME: Gloria [name] MEDICAL RECORD NO: [id number] DATE OF BIRTH: Sep 14, 1971 FACILITY: [facility] LOCATION: [facility] PHYSICIAN: Wynonia Sours, MD   OPERATIVE REPORT   DATE OF PROCEDURE: 09/03/21    PREOPERATIVE DIAGNOSIS: Mass right small finger middle distal phalangeal   POSTOPERATIVE DIAGNOSIS: Same   PROCEDURE: Excisional biopsy mass right small finger   SURGEON: Daryll Brod, M.D.   ASSISTANT: Leanora Cover, MD   ANESTHESIA:  Regional with sedation and Local   INTRAVENOUS FLUIDS:  Per anesthesia flow sheet.   ESTIMATED BLOOD LOSS:  Minimal.   COMPLICATIONS:  None.   SPECIMENS: Mass 2.1 x 0.7 cm   TOURNIQUET TIME:    Total Tourniquet Time Documented: Forearm (Right) - 46 minutes Total: Forearm (Right) - 46 minutes    DISPOSITION:  Stable to PACU.   INDICATIONS: Patient is a 49 year old female with a mass on the distal phalangeal joint middle phalanx area of her right small finger.  This been present for several years.  Ultra sound reveals a multiple mass MRI reveals 1 large circumferential mass of the middle phalanx at the distal phalangeal joint right small finger.  She is desires having this removed.  Pre-.  Postoperative course been discussed along with risk complications.  She is aware there is no guarantee to the surgery possibility of infection recurrence injury to arteries nerves tendons incomplete relief symptoms dystrophy.  Preoperative area the patient is seen extremity marked by both patient and surgeon antibiotic given.  A supraclavicular block was carried out under the direction of the anesthesia department.  OPERATIVE COURSE: Patient is brought the operating room placed in supine position with the right arm free.  A timeout was taken confirm patient procedure.  A prep was done with ChloraPrep 3-minute dry time was allowed and timeout taken.  The limb was exsanguinated with an Esmarch bandage turn placed the forearm  was inflated to 250 mmHg.  A  volar zigzag incision was made over the middle phalanx carried down through subcutaneous tissue the mass was immediately encountered.  She had feeling at this point time and metacarpal block was given with 1% Xylocaine and quarter percent Marcaine both without epinephrine.  This was multilobulated brown and yellow mass.  This involved primarily volar aspect over to the radial side and then proceeded dorsally.  The neurovascular bundle was identified and protected.  With blunt sharp dissection the mass was dissected free and found to extend under the flexor tendon a separate incision was then made dorsally.  This was carried down through subcutaneous tissue again.  Bleeders were electrocauterized.  The mass was immediately encountered covering over the  extensor tendon and appeared to enter into the joint.  The joint was opened.  Component was removed from the joint.  Direction was then attended to volarly.  The mass on the volar aspect was then dissected and pushed dorsally.  This allowed 1 component to be excised after cutting it volarly as it proceeded under the flexor tendon.  Dissection was then carried to the ulnar side of the middle phalanx and the component under the flexor tendon was then dissected free and found to be under the volar plate.  It was then removed with blunt sharp dissection using small bleeders and a house curette.  Ulnar neurovascular bundle was protected.  The mass was found to proceed up to the midportion of the middle phalanx and was entirely removed.  The specimen was sent to pathology.  The wound was copiously irrigated with  saline.  The skin was then closed with interrupted 5-0 nylon sutures.  A sterile dressing was placed the tourniquet was deflated.  The finger immediately pinked a dorsal splint was then applied to the digit.  The dressing completed and she was taken to the recovery room for observation in satisfactory condition.  She will be discharged home to return to the hand  center [facility] in 1 week Tylenol ibuprofen for pain with Ultram for breakthrough.   Daryll Brod, MD Electronically signed, 09/03/21

## 2021-09-03 NOTE — Transfer of Care (Signed)
Immediate Anesthesia Transfer of Care Note  Patient: Gloria [name]  Procedure(s) Performed: EXCISION METACARPAL MASS RIGHT SMALL FINGER (Right: Hand)  Patient Location: PACU  Anesthesia Type:MAC combined with regional for post-op pain  Level of Consciousness: awake, alert  and oriented  Airway & Oxygen Therapy: Patient Spontanous Breathing and Patient connected to face mask oxygen  Post-op Assessment: Report given to RN and Post -op Vital signs reviewed and stable  Post vital signs: Reviewed and stable  Last Vitals:  Vitals Value Taken Time  BP 123/56 09/03/21 0955  Temp    Pulse 91 09/03/21 0957  Resp 16 09/03/21 0957  SpO2 96 % 09/03/21 0957  Vitals shown include unvalidated device data.  Last Pain:  Vitals:   09/03/21 0728  TempSrc: Oral  PainSc:          Complications: No notable events documented.

## 2021-09-03 NOTE — Anesthesia Procedure Notes (Signed)
Anesthesia Regional Block: Supraclavicular block   Pre-Anesthetic Checklist: , timeout performed,  Correct Patient, Correct Site, Correct Laterality,  Correct Procedure, Correct Position, site marked,  Risks and benefits discussed,  Surgical consent,  Pre-op evaluation,  At surgeon's request and post-op pain management  Laterality: Right  Prep: chloraprep       Needles:  Injection technique: Single-shot  Needle Type: Echogenic Stimulator Needle     Needle Length: 9cm  Needle Gauge: 21     Additional Needles:   Procedures:,,,, ultrasound used (permanent image in chart),,    Narrative:  Start time: 09/03/2021 8:05 AM End time: 09/03/2021 8:15 AM Injection made incrementally with aspirations every 5 mL.  Performed by: Personally  Anesthesiologist: Murvin Natal, MD  Additional Notes: Functioning IV was confirmed and monitors were applied.  A timeout was performed. Sterile prep, hand hygiene and sterile gloves were used. A 69m 21ga Arrow echogenic stimulator needle was used. Negative aspiration and negative test dose prior to incremental administration of local anesthetic. The patient tolerated the procedure well.  Ultrasound guidance: relevent anatomy identified, needle position confirmed, local anesthetic spread visualized around nerve(s), vascular puncture avoided.  Image printed for medical record.

## 2021-09-03 NOTE — Progress Notes (Signed)
Assisted Dr. Ellender with right, ultrasound guided, supraclavicular block. Side rails up, monitors on throughout procedure. See vital signs in flow sheet. Tolerated Procedure well. 

## 2021-09-04 ENCOUNTER — Encounter (HOSPITAL_BASED_OUTPATIENT_CLINIC_OR_DEPARTMENT_OTHER): Payer: Self-pay | Admitting: Orthopedic Surgery

## 2021-09-07 ENCOUNTER — Encounter (INDEPENDENT_AMBULATORY_CARE_PROVIDER_SITE_OTHER): Payer: Self-pay | Admitting: Family Medicine

## 2021-09-07 LAB — SURGICAL PATHOLOGY

## 2021-09-08 ENCOUNTER — Other Ambulatory Visit: Payer: Self-pay

## 2021-09-08 ENCOUNTER — Encounter (INDEPENDENT_AMBULATORY_CARE_PROVIDER_SITE_OTHER): Payer: Self-pay | Admitting: Family Medicine

## 2021-09-08 ENCOUNTER — Telehealth (INDEPENDENT_AMBULATORY_CARE_PROVIDER_SITE_OTHER): Payer: BC Managed Care – PPO | Admitting: Family Medicine

## 2021-09-08 DIAGNOSIS — Z6841 Body Mass Index (BMI) 40.0 and over, adult: Secondary | ICD-10-CM

## 2021-09-08 DIAGNOSIS — E1169 Type 2 diabetes mellitus with other specified complication: Secondary | ICD-10-CM

## 2021-09-08 DIAGNOSIS — G4733 Obstructive sleep apnea (adult) (pediatric): Secondary | ICD-10-CM | POA: Diagnosis not present

## 2021-09-08 NOTE — Progress Notes (Signed)
TeleHealth Visit:  Due to the COVID-19 pandemic, this visit was completed with telemedicine (audio/video) technology to reduce patient and provider exposure as well as to preserve personal protective equipment.   Gloria [name] has verbally consented to this TeleHealth visit. The patient is located at home, the provider is located at the [facility] office. The participants in this visit include the listed provider and patient. The visit was conducted today via MyChart video.   Chief Complaint: OBESITY Gloria [name] is here to discuss her progress with her obesity treatment plan along with follow-up of her obesity related diagnoses. Gloria [name] is on the Category 2 Plan + 100 calories and states she is following her eating plan approximately 0% of the time. Gloria [name] states she is doing 0 minutes 0 times per week.  Today's visit was #: 3 Starting weight: 274 lbs Starting date: 08/07/2021  Interim History: Gloria [name] had hand surgery and she is unable to drive. She continues to do well with her eating plan, and her hunger has decreased. She is not exercising until she is off of her pain medications.  Subjective:   1. Type 2 diabetes mellitus with other specified complication, without long-term current use of insulin (HCC) Gloria [name] started Ozempic at 0.25 mg dose. She notes decreased polyphagia which has made following her diabetic eating plan easier. No nausea or vomiting was noted. She denies hypoglycemia.  Assessment/Plan:   1. Type 2 diabetes mellitus with other specified complication, without long-term current use of insulin (HCC) Gloria [name] will continue Ozempic at 0.25 mg for 2-4 more weeks, and we will reassess her dose at her next follow up visit. Good blood sugar control is important to decrease the likelihood of diabetic complications such as nephropathy, neuropathy, limb loss, blindness, coronary artery disease, and death. Intensive lifestyle modification including diet, exercise and  weight loss are the first line of treatment for diabetes.   2. Obesity with current BMI of 45.4 Gloria [name] is currently in the action stage of change. As such, her goal is to continue with weight loss efforts. She has agreed to the Category 2 Plan + 100 calories.   Exercise goals: No exercise has been prescribed at this time.  Behavioral modification strategies: increasing lean protein intake and increasing water intake.  Gloria [name] has agreed to follow-up with our clinic in 2 weeks. She was informed of the importance of frequent follow-up visits to maximize her success with intensive lifestyle modifications for her multiple health conditions.  Objective:   VITALS: Per patient if applicable, see vitals. GENERAL: Alert and in no acute distress. CARDIOPULMONARY: No increased WOB. Speaking in clear sentences.  PSYCH: Pleasant and cooperative. Speech normal rate and rhythm. Affect is appropriate. Insight and judgement are appropriate. Attention is focused, linear, and appropriate.  NEURO: Oriented as arrived to appointment on time with no prompting.   Lab Results  Component Value Date   CREATININE 0.50 (L) 08/07/2021   BUN 8 08/07/2021   NA 138 08/07/2021   K 4.0 08/07/2021   CL 101 08/07/2021   CO2 24 08/07/2021   Lab Results  Component Value Date   ALT 13 08/07/2021   AST 13 08/07/2021   ALKPHOS 85 08/07/2021   BILITOT 0.2 08/07/2021   Lab Results  Component Value Date   HGBA1C 6.2 (H) 08/07/2021   HGBA1C 6.3 (A) 05/20/2021   HGBA1C 5.7 (H) 09/19/2020   HGBA1C 5.8 (A) 07/31/2020   HGBA1C 5.3 03/13/2020   Lab Results  Component Value Date   INSULIN  29.5 (H) 08/07/2021   Lab Results  Component Value Date   TSH 2.860 08/07/2021   Lab Results  Component Value Date   CHOL 247 (H) 08/07/2021   HDL 47 08/07/2021   LDLCALC 168 (H) 08/07/2021   LDLDIRECT 145.0 07/18/2019   TRIG 172 (H) 08/07/2021   CHOLHDL 6.1 (H) 09/19/2020   Lab Results  Component Value Date   VD25OH  45.2 08/07/2021   VD25OH 57.79 07/18/2019   VD25OH 29.8 (L) 01/11/2018   Lab Results  Component Value Date   WBC 10.4 08/07/2021   HGB 13.2 08/07/2021   HCT 40.2 08/07/2021   MCV 89 08/07/2021   PLT 284 08/07/2021   Lab Results  Component Value Date   IRON 54 10/06/2017   TIBC 304 10/06/2017   FERRITIN 151 (H) 10/06/2017    Attestation Statements:   Reviewed by clinician on day of visit: allergies, medications, problem list, medical history, surgical history, family history, social history, and previous encounter notes.   I, Trixie Dredge, am acting as transcriptionist for Dennard Nip, MD.  I have reviewed the above documentation for accuracy and completeness, and I agree with the above. - Dennard Nip, MD

## 2021-09-10 ENCOUNTER — Telehealth: Payer: Self-pay | Admitting: Psychiatry

## 2021-09-10 ENCOUNTER — Telehealth (INDEPENDENT_AMBULATORY_CARE_PROVIDER_SITE_OTHER): Payer: BC Managed Care – PPO | Admitting: Psychiatry

## 2021-09-10 DIAGNOSIS — F411 Generalized anxiety disorder: Secondary | ICD-10-CM | POA: Diagnosis not present

## 2021-09-10 DIAGNOSIS — F3340 Major depressive disorder, recurrent, in remission, unspecified: Secondary | ICD-10-CM | POA: Diagnosis not present

## 2021-09-10 DIAGNOSIS — F331 Major depressive disorder, recurrent, moderate: Secondary | ICD-10-CM | POA: Diagnosis not present

## 2021-09-10 DIAGNOSIS — F3181 Bipolar II disorder: Secondary | ICD-10-CM | POA: Diagnosis not present

## 2021-09-10 NOTE — Telephone Encounter (Signed)
Ms. jolyne, laye are scheduled for a virtual visit with your provider today.    Just as we do with appointments in the office, we must obtain your consent to participate.  Your consent will be active for this visit and any virtual visit you may have with one of our providers in the next 365 days.    If you have a MyChart account, I can also send a copy of this consent to you electronically.  All virtual visits are billed to your insurance company just like a traditional visit in the office.  As this is a virtual visit, video technology does not allow for your provider to perform a traditional examination.  This may limit your provider's ability to fully assess your condition.  If your provider identifies any concerns that need to be evaluated in person or the need to arrange testing such as labs, EKG, etc, we will make arrangements to do so.    Although advances in technology are sophisticated, we cannot ensure that it will always work on either your end or our end.  If the connection with a video visit is poor, we may have to switch to a telephone visit.  With either a video or telephone visit, we are not always able to ensure that we have a secure connection.   I need to obtain your verbal consent now.   Are you willing to proceed with your visit today?   Salsabeel Gorelick Graf has provided verbal consent on 09/10/2021 for a virtual visit (video or telephone).   Lina Sayre, [facility] 09/10/2021  2:59 PM

## 2021-09-10 NOTE — Progress Notes (Signed)
[facility] Counselor/Therapist Progress Note  Patient ID: Gloria [name], MRN: [id number],    Date: 09/10/2021  Time Spent: 45 minutes start time 2:58 PM end time 3:43 PM Virtual Visit via video Note Connected with patient by a telemedicine/telehealth application, with their informed consent, and verified patient privacy and that I am speaking with the correct person using two identifiers. I discussed the limitations, risks, security and privacy concerns of performing psychotherapy and the availability of in person appointments. I also discussed with the patient that there may be a patient responsible charge related to this service. The patient expressed understanding and agreed to proceed. I discussed the treatment planning with the patient. The patient was provided an opportunity to ask questions and all were answered. The patient agreed with the plan and demonstrated an understanding of the instructions. The patient was advised to call  our office if  symptoms worsen or feel they are in a crisis state and need immediate contact.   Therapist Location: office Patient Location: home    Treatment Type: Individual Therapy  Reported Symptoms: anxiety, pain issues, triggered responses, low motivation, difficulties with self care, rumination  Mental Status Exam:  Appearance:   Casual     Behavior:  Appropriate  Motor:  Normal  Speech/Language:   Normal Rate  Affect:  Appropriate  Mood:  anxious  Thought process:  normal  Thought content:    WNL  Sensory/Perceptual disturbances:    WNL  Orientation:  oriented to person, place, time/date, and situation  Attention:  Good  Concentration:  Good  Memory:  WNL  Fund of knowledge:   Good  Insight:    Good  Judgment:   Good  Impulse Control:  Good   Risk Assessment: Danger to Self:  No Self-injurious Behavior: No Danger to Others: No Duty to Warn:no Physical Aggression / Violence:No  Access to Firearms a concern: No  Gang  Involvement:No   Subjective: Met with patient via virtual session.  She shared she had had her surgery on her finger and it was a success.  She shared that she is having lots of anxiety due to having just started a new job and having all the health issues that she has had. She just met with her NP and she will be upping her medication again due to her anxiety at bedtime and waking up. Patient was able to recognize that she has had multiple changes over the past 6 months and that could be behind the anxiety that she is feeling.  She shared that she is struggling with self care.  Discussed thought stopping technique ST OP P with patient.  Patient reported not being the office was really stirring up fear in her stomach the processing set on that suds level 10, negative cognition "I am not doing my job".  Patient was able to reduce suds level to 3.  She reported feeling much better at the end of processing set.  Patient was able to think through different strategies to talk herself through making positive choices and reminding herself that she has changed and grown.  Interventions: Cognitive Behavioral Therapy, Solution-Oriented/Positive Psychology, Insight-Oriented, and B SP  Diagnosis:   ICD-10-CM   1. MDD (recurrent major depressive disorder) in remission [facility])  F33.40       Plan: Patient is to use CBT and coping skills to improve positive self image.  Patient is to listen to brain spotting bilateral music prior to bedtime.  Patient is to work  on self talk and practice CBT skill ST OP P.  Patient is to take medication as directed Long-term goal: Develop a consistent positive self image Short-term goal: Formal realistic appropriate and attainable goals: Use positive self talk messages to build self-esteem-process areas of self-doubt  Lina Sayre, [facility]

## 2021-09-11 ENCOUNTER — Encounter (INDEPENDENT_AMBULATORY_CARE_PROVIDER_SITE_OTHER): Payer: Self-pay | Admitting: Family Medicine

## 2021-09-14 NOTE — Telephone Encounter (Signed)
Last OV with Dr. Beasley 

## 2021-09-14 NOTE — Telephone Encounter (Signed)
Please see message and advise.  Thank you. ° °

## 2021-09-16 DIAGNOSIS — F3181 Bipolar II disorder: Secondary | ICD-10-CM | POA: Diagnosis not present

## 2021-09-22 ENCOUNTER — Ambulatory Visit (INDEPENDENT_AMBULATORY_CARE_PROVIDER_SITE_OTHER): Payer: BC Managed Care – PPO | Admitting: Family Medicine

## 2021-09-22 ENCOUNTER — Other Ambulatory Visit: Payer: Self-pay

## 2021-09-22 VITALS — BP 153/89 | HR 69 | Temp 97.9°F | Ht 65.0 in | Wt 275.0 lb

## 2021-09-22 DIAGNOSIS — Z6841 Body Mass Index (BMI) 40.0 and over, adult: Secondary | ICD-10-CM | POA: Diagnosis not present

## 2021-09-22 DIAGNOSIS — E66813 Obesity, class 3: Secondary | ICD-10-CM

## 2021-09-22 DIAGNOSIS — E1169 Type 2 diabetes mellitus with other specified complication: Secondary | ICD-10-CM

## 2021-09-22 MED ORDER — OZEMPIC (0.25 OR 0.5 MG/DOSE) 2 MG/1.5ML ~~LOC~~ SOPN: 0.5000 mg | PEN_INJECTOR | SUBCUTANEOUS | 0 refills | Status: DC

## 2021-09-23 NOTE — Progress Notes (Signed)
Chief Complaint:   OBESITY Gloria [name] is here to discuss her progress with her obesity treatment plan along with follow-up of her obesity related diagnoses. Gloria [name] is on the Category 2 Plan + 100 calories and states she is following her eating plan approximately 50% of the time. Gloria [name] states she is doing 0 minutes 0 times per week.  Today's visit was #: 4 Starting weight: 274 lbs Starting date: 08/07/2021 Today's weight: 275 lbs Today's date: 09/22/2021 Total lbs lost to date: 0 Total lbs lost since last in-office visit: 0  Interim History: Gloria [name] is retaining some fluid today. She actually did very well with portion control while recovering from surgery. She is happy with her plan overall, but she would like more dinner options.  Subjective:   1. Type 2 diabetes mellitus with other specified complication, without long-term current use of insulin (HCC) Gloria [name] is working on diet and weight loss, and she is doing well on Ozempic with no side effects noted. She still has some polyphagia.  Assessment/Plan:   1. Type 2 diabetes mellitus with other specified complication, without long-term current use of insulin ([facility]) Gloria [name] agreed to increase Ozempic to 0.5 mg q weekly and we will refill for 1 month. Good blood sugar control is important to decrease the likelihood of diabetic complications such as nephropathy, neuropathy, limb loss, blindness, coronary artery disease, and death. Intensive lifestyle modification including diet, exercise and weight loss are the first line of treatment for diabetes.   - Semaglutide,0.25 or 0.5MG/DOS, (OZEMPIC, 0.25 OR 0.5 MG/DOSE,) 2 MG/1.5ML SOPN; Inject 0.5 mg into the skin once a week.  Dispense: 1.5 mL; Refill: 0  2. Obesity with current BMI of 45.8 Gloria [name] is currently in the action stage of change. As such, her goal is to continue with weight loss efforts. She has agreed to the Category 2 Plan and keeping a food journal and adhering to recommended  goals of 400-550 calories and 40 grams of protein at supper daily.   Behavioral modification strategies: meal planning and cooking strategies and keeping a strict food journal for dinner.  Gloria [name] has agreed to follow-up with our clinic in 2 to 3 weeks. She was informed of the importance of frequent follow-up visits to maximize her success with intensive lifestyle modifications for her multiple health conditions.   Objective:   Blood pressure (!) 153/89, pulse 69, temperature 97.9 F (36.6 C), height 5' 5"  (1.651 m), weight 275 lb (124.7 kg), last menstrual period 04/25/2013, SpO2 98 %. Body mass index is 45.76 kg/m.  General: Cooperative, alert, well developed, in no acute distress. HEENT: Conjunctivae and lids unremarkable. Cardiovascular: Regular rhythm.  Lungs: Normal work of breathing. Neurologic: No focal deficits.   Lab Results  Component Value Date   CREATININE 0.50 (L) 08/07/2021   BUN 8 08/07/2021   NA 138 08/07/2021   K 4.0 08/07/2021   CL 101 08/07/2021   CO2 24 08/07/2021   Lab Results  Component Value Date   ALT 13 08/07/2021   AST 13 08/07/2021   ALKPHOS 85 08/07/2021   BILITOT 0.2 08/07/2021   Lab Results  Component Value Date   HGBA1C 6.2 (H) 08/07/2021   HGBA1C 6.3 (A) 05/20/2021   HGBA1C 5.7 (H) 09/19/2020   HGBA1C 5.8 (A) 07/31/2020   HGBA1C 5.3 03/13/2020   Lab Results  Component Value Date   INSULIN 29.5 (H) 08/07/2021   Lab Results  Component Value Date   TSH 2.860 08/07/2021   Lab Results  Component Value Date   CHOL 247 (H) 08/07/2021   HDL 47 08/07/2021   LDLCALC 168 (H) 08/07/2021   LDLDIRECT 145.0 07/18/2019   TRIG 172 (H) 08/07/2021   CHOLHDL 6.1 (H) 09/19/2020   Lab Results  Component Value Date   VD25OH 45.2 08/07/2021   VD25OH 57.79 07/18/2019   VD25OH 29.8 (L) 01/11/2018   Lab Results  Component Value Date   WBC 10.4 08/07/2021   HGB 13.2 08/07/2021   HCT 40.2 08/07/2021   MCV 89 08/07/2021   PLT 284 08/07/2021    Lab Results  Component Value Date   IRON 54 10/06/2017   TIBC 304 10/06/2017   FERRITIN 151 (H) 10/06/2017   Attestation Statements:   Reviewed by clinician on day of visit: allergies, medications, problem list, medical history, surgical history, family history, social history, and previous encounter notes.  Time spent on visit including pre-visit chart review and post-visit care and charting was 35 minutes.    I, Trixie Dredge, am acting as transcriptionist for Dennard Nip, MD.  I have reviewed the above documentation for accuracy and completeness, and I agree with the above. -  Dennard Nip, MD

## 2021-10-07 DIAGNOSIS — G4733 Obstructive sleep apnea (adult) (pediatric): Secondary | ICD-10-CM | POA: Diagnosis not present

## 2021-10-08 ENCOUNTER — Other Ambulatory Visit: Payer: Self-pay

## 2021-10-08 ENCOUNTER — Ambulatory Visit (INDEPENDENT_AMBULATORY_CARE_PROVIDER_SITE_OTHER): Payer: BC Managed Care – PPO | Admitting: Family Medicine

## 2021-10-08 ENCOUNTER — Encounter (INDEPENDENT_AMBULATORY_CARE_PROVIDER_SITE_OTHER): Payer: Self-pay | Admitting: Family Medicine

## 2021-10-08 VITALS — BP 151/81 | HR 77 | Temp 98.0°F | Ht 65.0 in | Wt 270.0 lb

## 2021-10-08 DIAGNOSIS — E66813 Obesity, class 3: Secondary | ICD-10-CM

## 2021-10-08 DIAGNOSIS — Z6841 Body Mass Index (BMI) 40.0 and over, adult: Secondary | ICD-10-CM

## 2021-10-08 DIAGNOSIS — E1169 Type 2 diabetes mellitus with other specified complication: Secondary | ICD-10-CM | POA: Diagnosis not present

## 2021-10-08 DIAGNOSIS — Z9189 Other specified personal risk factors, not elsewhere classified: Secondary | ICD-10-CM | POA: Diagnosis not present

## 2021-10-08 DIAGNOSIS — E559 Vitamin D deficiency, unspecified: Secondary | ICD-10-CM

## 2021-10-08 MED ORDER — OZEMPIC (0.25 OR 0.5 MG/DOSE) 2 MG/1.5ML ~~LOC~~ SOPN: 0.5000 mg | PEN_INJECTOR | SUBCUTANEOUS | 0 refills | Status: DC

## 2021-10-08 NOTE — Progress Notes (Signed)
T risk

## 2021-10-09 DIAGNOSIS — G4733 Obstructive sleep apnea (adult) (pediatric): Secondary | ICD-10-CM | POA: Diagnosis not present

## 2021-10-12 NOTE — Progress Notes (Signed)
Chief Complaint:   OBESITY Gloria [name] is here to discuss her progress with her obesity treatment plan along with follow-up of her obesity related diagnoses. Gloria [name] is on the Category 2 Plan and keeping a food journal and adhering to recommended goals of 400-500 calories and 40 grams of protein at supper daily and states she is following her eating plan approximately 75% of the time. Gloria [name] states she is walking at work 5 times per week.   Today's visit was #: 5 Starting weight: 274 lbs Starting date: 08/07/2021 Today's weight: 270 lbs Today's date: 10/08/2021 Total lbs lost to date: 4 Total lbs lost since last in-office visit: 5  Interim History: Gloria [name] continues to do well with weight loss on her plan. Her hunger is controlled and she has tried to increase her steps at work. She is doing well with portion control and making smarter choices.   Subjective:   1. Type 2 diabetes mellitus with other specified complication, without long-term current use of insulin (HCC) Gloria [name] is doing well on Ozempic. She notes decreased polyphagia. Last A1c was well controlled at 6.2.  2. Vitamin D deficiency Gloria [name] is stable on Vit D 2,000 IU daily OTC. She denies nausea, vomiting, or muscle weakness.  3. At risk for impaired metabolic function Gloria [name] is at increased risk for impaired metabolic function if protein decreases too low.  Assessment/Plan:   1. Type 2 diabetes mellitus with other specified complication, without long-term current use of insulin (HCC) We will refill Ozempic for 1 month. Gloria [name] will consider decreasing glipizide after the holidays are over, especially if we increase her Ozempic dose. We will recheck labs in 2 months. Good blood sugar control is important to decrease the likelihood of diabetic complications such as nephropathy, neuropathy, limb loss, blindness, coronary artery disease, and death. Intensive lifestyle modification including diet, exercise and weight loss  are the first line of treatment for diabetes.   - Semaglutide,0.25 or 0.5MG/DOS, (OZEMPIC, 0.25 OR 0.5 MG/DOSE,) 2 MG/1.5ML SOPN; Inject 0.5 mg into the skin once a week.  Dispense: 1.5 mL; Refill: 0  2. Vitamin D deficiency Low Vitamin D level contributes to fatigue and are associated with obesity, breast, and colon cancer. Shiori will continue OTC Vitamin D 2,000 IU daily and we will recheck labs in 2 months. She will follow-up for routine testing of Vitamin D, at least 2-3 times per year to avoid over-replacement.  3. At risk for impaired metabolic function Gloria [name] was given approximately 15 minutes of impaired  metabolic function prevention counseling today. We discussed intensive lifestyle modifications today with an emphasis on specific nutrition and exercise instructions and strategies.   Repetitive spaced learning was employed today to elicit superior memory formation and behavioral change.  4. Obesity with current BMI of 45.0 Gloria [name] is currently in the action stage of change. As such, her goal is to continue with weight loss efforts. She has agreed to the Category 2 Plan + 100 calories.   Exercise goals: Increase her steps to at least 3,000 per day.  Behavioral modification strategies: increasing lean protein intake and holiday eating strategies .  Gloria [name] has agreed to follow-up with our clinic in 3 weeks. She was informed of the importance of frequent follow-up visits to maximize her success with intensive lifestyle modifications for her multiple health conditions.   Objective:   Blood pressure (!) 151/81, pulse 77, temperature 98 F (36.7 C), height 5' 5"  (1.651 m), weight 270 lb (122.5 kg), last menstrual period 04/25/2013,  SpO2 98 %. Body mass index is 44.93 kg/m.  General: Cooperative, alert, well developed, in no acute distress. HEENT: Conjunctivae and lids unremarkable. Cardiovascular: Regular rhythm.  Lungs: Normal work of breathing. Neurologic: No focal deficits.    Lab Results  Component Value Date   CREATININE 0.50 (L) 08/07/2021   BUN 8 08/07/2021   NA 138 08/07/2021   K 4.0 08/07/2021   CL 101 08/07/2021   CO2 24 08/07/2021   Lab Results  Component Value Date   ALT 13 08/07/2021   AST 13 08/07/2021   ALKPHOS 85 08/07/2021   BILITOT 0.2 08/07/2021   Lab Results  Component Value Date   HGBA1C 6.2 (H) 08/07/2021   HGBA1C 6.3 (A) 05/20/2021   HGBA1C 5.7 (H) 09/19/2020   HGBA1C 5.8 (A) 07/31/2020   HGBA1C 5.3 03/13/2020   Lab Results  Component Value Date   INSULIN 29.5 (H) 08/07/2021   Lab Results  Component Value Date   TSH 2.860 08/07/2021   Lab Results  Component Value Date   CHOL 247 (H) 08/07/2021   HDL 47 08/07/2021   LDLCALC 168 (H) 08/07/2021   LDLDIRECT 145.0 07/18/2019   TRIG 172 (H) 08/07/2021   CHOLHDL 6.1 (H) 09/19/2020   Lab Results  Component Value Date   VD25OH 45.2 08/07/2021   VD25OH 57.79 07/18/2019   VD25OH 29.8 (L) 01/11/2018   Lab Results  Component Value Date   WBC 10.4 08/07/2021   HGB 13.2 08/07/2021   HCT 40.2 08/07/2021   MCV 89 08/07/2021   PLT 284 08/07/2021   Lab Results  Component Value Date   IRON 54 10/06/2017   TIBC 304 10/06/2017   FERRITIN 151 (H) 10/06/2017   Attestation Statements:   Reviewed by clinician on day of visit: allergies, medications, problem list, medical history, surgical history, family history, social history, and previous encounter notes.   I, Trixie Dredge, am acting as transcriptionist for Dennard Nip, MD.  I have reviewed the above documentation for accuracy and completeness, and I agree with the above. -  Dennard Nip, MD

## 2021-10-13 ENCOUNTER — Ambulatory Visit: Payer: BC Managed Care – PPO | Admitting: Internal Medicine

## 2021-10-14 DIAGNOSIS — M533 Sacrococcygeal disorders, not elsewhere classified: Secondary | ICD-10-CM | POA: Diagnosis not present

## 2021-10-21 ENCOUNTER — Encounter (INDEPENDENT_AMBULATORY_CARE_PROVIDER_SITE_OTHER): Payer: Self-pay | Admitting: Family Medicine

## 2021-10-21 ENCOUNTER — Ambulatory Visit (INDEPENDENT_AMBULATORY_CARE_PROVIDER_SITE_OTHER): Payer: BC Managed Care – PPO | Admitting: Family Medicine

## 2021-10-21 ENCOUNTER — Other Ambulatory Visit: Payer: Self-pay

## 2021-10-21 VITALS — BP 144/93 | HR 93 | Temp 98.6°F | Ht 65.0 in | Wt 267.0 lb

## 2021-10-21 DIAGNOSIS — E559 Vitamin D deficiency, unspecified: Secondary | ICD-10-CM

## 2021-10-21 DIAGNOSIS — I10 Essential (primary) hypertension: Secondary | ICD-10-CM | POA: Diagnosis not present

## 2021-10-21 DIAGNOSIS — E1169 Type 2 diabetes mellitus with other specified complication: Secondary | ICD-10-CM

## 2021-10-21 DIAGNOSIS — Z6841 Body Mass Index (BMI) 40.0 and over, adult: Secondary | ICD-10-CM

## 2021-10-21 DIAGNOSIS — Z9189 Other specified personal risk factors, not elsewhere classified: Secondary | ICD-10-CM

## 2021-10-21 MED ORDER — VITAMIN D (ERGOCALCIFEROL) 1.25 MG (50000 UNIT) PO CAPS: 50000.0000 [IU] | ORAL_CAPSULE | ORAL | 0 refills | Status: DC

## 2021-10-21 NOTE — Progress Notes (Signed)
Chief Complaint:   OBESITY Gloria [name] is here to discuss her progress with her obesity treatment plan along with follow-up of her obesity related diagnoses. Gloria [name] is on the Category 2 Plan + 100 calories and states she is following her eating plan approximately 25% of the time. Gloria [name] states she is doing 0 minutes 0 times per week.  Today's visit was #: 6 Starting weight: 274 lbs Starting date: 08/07/2021 Today's weight: 267 lbs Today's date: 10/21/2021 Total lbs lost to date: 7 Total lbs lost since last in-office visit: 3  Interim History: Gloria [name] has had extra challenges over Thanksgiving with throwing her back out. She did more snacking and not getting enough protein. She is journaling at lunch and would like to try journaling all day.  Subjective:   1. Essential hypertension Gloria [name]'s blood pressure is elevated today. She is doing well with diet and exercise, and she has been on prednisone recently. She denies chest pain.  2. Type 2 diabetes mellitus with other specified complication, without long-term current use of insulin (Gloria [name]) Gloria [name] doing well on Ozempic. She notes decreased polyphagia, and she denies nausea or vomiting.   3. Vitamin D deficiency Gloria [name] is on Vit D, but her level is not yet at goal. She notes fatigue.  4. At risk for impaired metabolic function Gloria [name] is at increased risk for impaired metabolic function due to decreased protein.  Assessment/Plan:   1. Essential hypertension Gloria [name] will continue diet and exercise to improve blood pressure control. We will recheck her blood pressure in 2 weeks.  2. Type 2 diabetes mellitus with other specified complication, without long-term current use of insulin (Gloria [name]) Gloria [name] will continue Ozempic at 0.5 mg and will follow closely. Good blood sugar control is important to decrease the likelihood of diabetic complications such as nephropathy, neuropathy, limb loss, blindness, coronary artery disease, and death.  Intensive lifestyle modification including diet, exercise and weight loss are the first line of treatment for diabetes.   3. Vitamin D deficiency Low Vitamin D level contributes to fatigue and are associated with obesity, breast, and colon cancer. We will refill prescription Vitamin D for 1 month. Gloria [name] will follow-up for routine testing of Vitamin D, at least 2-3 times per year to avoid over-replacement.  - Vitamin D, Ergocalciferol, (DRISDOL) 1.25 MG (50000 UNIT) CAPS capsule; Take 1 capsule (50,000 Units total) by mouth every 7 (seven) days.  Dispense: 4 capsule; Refill: 0  4. At risk for impaired metabolic function Gloria [name] was given approximately 15 minutes of impaired  metabolic function prevention counseling today. We discussed intensive lifestyle modifications today with an emphasis on specific nutrition and exercise instructions and strategies.   Repetitive spaced learning was employed today to elicit superior memory formation and behavioral change.  5. Obesity with current BMI of 44.5 Gloria [name] is currently in the action stage of change. As such, her goal is to continue with weight loss efforts. She has agreed to change to keeping a food journal and adhering to recommended goals of 1200-1300 calories and 80+ grams of protein daily.   Behavioral modification strategies: increasing lean protein intake.  Gloria [name] has agreed to follow-up with our clinic in 2 weeks. She was informed of the importance of frequent follow-up visits to maximize her success with intensive lifestyle modifications for her multiple health conditions.   Objective:   Blood pressure (!) 144/93, pulse 93, temperature 98.6 F (37 C), height 5' 5"  (1.651 m), weight 267 lb (121.1 kg), last menstrual period 04/25/2013, SpO2 96 %.  Body mass index is 44.43 kg/m.  General: Cooperative, alert, well developed, in no acute distress. HEENT: Conjunctivae and lids unremarkable. Cardiovascular: Regular rhythm.  Lungs: Normal  work of breathing. Neurologic: No focal deficits.   Lab Results  Component Value Date   CREATININE 0.50 (L) 08/07/2021   BUN 8 08/07/2021   NA 138 08/07/2021   K 4.0 08/07/2021   CL 101 08/07/2021   CO2 24 08/07/2021   Lab Results  Component Value Date   ALT 13 08/07/2021   AST 13 08/07/2021   ALKPHOS 85 08/07/2021   BILITOT 0.2 08/07/2021   Lab Results  Component Value Date   HGBA1C 6.2 (H) 08/07/2021   HGBA1C 6.3 (A) 05/20/2021   HGBA1C 5.7 (H) 09/19/2020   HGBA1C 5.8 (A) 07/31/2020   HGBA1C 5.3 03/13/2020   Lab Results  Component Value Date   INSULIN 29.5 (H) 08/07/2021   Lab Results  Component Value Date   TSH 2.860 08/07/2021   Lab Results  Component Value Date   CHOL 247 (H) 08/07/2021   HDL 47 08/07/2021   LDLCALC 168 (H) 08/07/2021   LDLDIRECT 145.0 07/18/2019   TRIG 172 (H) 08/07/2021   CHOLHDL 6.1 (H) 09/19/2020   Lab Results  Component Value Date   VD25OH 45.2 08/07/2021   VD25OH 57.79 07/18/2019   VD25OH 29.8 (L) 01/11/2018   Lab Results  Component Value Date   WBC 10.4 08/07/2021   HGB 13.2 08/07/2021   HCT 40.2 08/07/2021   MCV 89 08/07/2021   PLT 284 08/07/2021   Lab Results  Component Value Date   IRON 54 10/06/2017   TIBC 304 10/06/2017   FERRITIN 151 (H) 10/06/2017   Attestation Statements:   Reviewed by clinician on day of visit: allergies, medications, problem list, medical history, surgical history, family history, social history, and previous encounter notes.   I, Trixie Dredge, am acting as transcriptionist for Dennard Nip, MD.  I have reviewed the above documentation for accuracy and completeness, and I agree with the above. -  Dennard Nip, MD

## 2021-10-22 DIAGNOSIS — F3181 Bipolar II disorder: Secondary | ICD-10-CM | POA: Diagnosis not present

## 2021-10-29 ENCOUNTER — Ambulatory Visit: Payer: BC Managed Care – PPO | Admitting: Psychiatry

## 2021-11-04 ENCOUNTER — Encounter (INDEPENDENT_AMBULATORY_CARE_PROVIDER_SITE_OTHER): Payer: Self-pay

## 2021-11-04 ENCOUNTER — Ambulatory Visit (INDEPENDENT_AMBULATORY_CARE_PROVIDER_SITE_OTHER): Payer: BC Managed Care – PPO | Admitting: Family Medicine

## 2021-11-06 ENCOUNTER — Other Ambulatory Visit: Payer: Self-pay

## 2021-11-06 ENCOUNTER — Ambulatory Visit (INDEPENDENT_AMBULATORY_CARE_PROVIDER_SITE_OTHER): Payer: BC Managed Care – PPO | Admitting: Obstetrics & Gynecology

## 2021-11-06 ENCOUNTER — Encounter: Payer: Self-pay | Admitting: Obstetrics & Gynecology

## 2021-11-06 VITALS — BP 120/64 | HR 78 | Resp 16 | Ht 64.75 in | Wt 272.0 lb

## 2021-11-06 DIAGNOSIS — M25551 Pain in right hip: Secondary | ICD-10-CM | POA: Diagnosis not present

## 2021-11-06 DIAGNOSIS — Z90710 Acquired absence of both cervix and uterus: Secondary | ICD-10-CM

## 2021-11-06 DIAGNOSIS — Z79890 Hormone replacement therapy (postmenopausal): Secondary | ICD-10-CM

## 2021-11-06 DIAGNOSIS — M87852 Other osteonecrosis, left femur: Secondary | ICD-10-CM | POA: Diagnosis not present

## 2021-11-06 DIAGNOSIS — Z6841 Body Mass Index (BMI) 40.0 and over, adult: Secondary | ICD-10-CM | POA: Diagnosis not present

## 2021-11-06 DIAGNOSIS — Z01419 Encounter for gynecological examination (general) (routine) without abnormal findings: Secondary | ICD-10-CM | POA: Diagnosis not present

## 2021-11-06 DIAGNOSIS — M7062 Trochanteric bursitis, left hip: Secondary | ICD-10-CM | POA: Diagnosis not present

## 2021-11-06 DIAGNOSIS — B372 Candidiasis of skin and nail: Secondary | ICD-10-CM

## 2021-11-06 DIAGNOSIS — M25552 Pain in left hip: Secondary | ICD-10-CM | POA: Diagnosis not present

## 2021-11-06 DIAGNOSIS — G4733 Obstructive sleep apnea (adult) (pediatric): Secondary | ICD-10-CM | POA: Diagnosis not present

## 2021-11-06 MED ORDER — ESTRADIOL 0.05 MG/24HR TD PTTW: 1.0000 | MEDICATED_PATCH | TRANSDERMAL | 4 refills | Status: DC

## 2021-11-06 NOTE — Progress Notes (Signed)
Gloria [name] [birth year] [id number]   History:    50 y.o. G1P1L1  Divorced   RP:  Established patient presenting for annual gyn exam    HPI: Postmenopause, well on Estradiol patch 0.05 twice weekly x 4 years.  S/P Total Hysterectomy, Left Oophorectomy, partial Right Oophorectomy.  No pelvic pain.  Abstinent.  Frequent yeast of skin under the breasts and at the inguinal folds, using Nystatin cream.  Breasts wnl.  BMI increased back to 45.6, but currently loosing weight at the [facility] x 2 months with Dr Renda Rolls. Increased fitness activities.  Has DM type 2.  Health Labs with Fam MD. Harriet Masson done in 2021.  PAP: 10-21-17, MMG: 07-03-21, COLONOSCOPY: 04-17-20 Past medical history,surgical history, family history and social history were all reviewed and documented in the EPIC chart.  Gynecologic History Patient's last menstrual period was 04/25/2013.  Obstetric History OB History  Gravida Para Term Preterm AB Living  1 1 1     1   SAB IAB Ectopic Multiple Live Births          1    # Outcome Date GA Lbr Len/2nd Weight Sex Delivery Anes PTL Lv  1 Term     M Vag-Spont  N LIV     ROS: A ROS was performed and pertinent positives and negatives are included in the history.  GENERAL: No fevers or chills. HEENT: No change in vision, no earache, sore throat or sinus congestion. NECK: No pain or stiffness. CARDIOVASCULAR: No chest pain or pressure. No palpitations. PULMONARY: No shortness of breath, cough or wheeze. GASTROINTESTINAL: No abdominal pain, nausea, vomiting or diarrhea, melena or bright red blood per rectum. GENITOURINARY: No urinary frequency, urgency, hesitancy or dysuria. MUSCULOSKELETAL: No joint or muscle pain, no back pain, no recent trauma. DERMATOLOGIC: No rash, no itching, no lesions. ENDOCRINE: No polyuria, polydipsia, no heat or cold intolerance. No recent change in weight. HEMATOLOGICAL: No anemia or easy bruising or bleeding. NEUROLOGIC: No headache, seizures,  numbness, tingling or weakness. PSYCHIATRIC: No depression, no loss of interest in normal activity or change in sleep pattern.     Exam:   BP 120/64    Pulse 78    Resp 16    Ht 5' 4.75" (1.645 m)    Wt 272 lb (123.4 kg)    LMP 04/25/2013    BMI 45.61 kg/m   Body mass index is 45.61 kg/m.  General appearance : Well developed well nourished female. No acute distress HEENT: Eyes: no retinal hemorrhage or exudates,  Neck supple, trachea midline, no carotid bruits, no thyroidmegaly Lungs: Clear to auscultation, no rhonchi or wheezes, or rib retractions  Heart: Regular rate and rhythm, no murmurs or gallops Breast:Examined in sitting and supine position were symmetrical in appearance, no palpable masses or tenderness,  no skin retraction, no nipple inversion, no nipple discharge, no skin discoloration, no axillary or supraclavicular lymphadenopathy Abdomen: no palpable masses or tenderness, no rebound or guarding Extremities: no edema or skin discoloration or tenderness  Pelvic: Vulva: Normal             Vagina: No gross lesions or discharge  Cervix/Uterus absent  Adnexa  Without masses or tenderness  Anus: Normal   Assessment/Plan:  50 y.o. female for annual exam   1. Well woman exam with routine gynecological exam Postmenopause, well on Estradiol patch 0.05 twice weekly x 4 years.  S/P Total Hysterectomy, Left Oophorectomy, partial Right Oophorectomy.  No pelvic pain.  Abstinent.  Frequent yeast of skin under the breasts and at the inguinal folds, using Nystatin cream.  Breasts wnl.  BMI increased back to 45.6, but currently loosing weight at the [facility] x 2 months with Dr Renda Rolls. Increased fitness activities.  Has DM type 2.  Health Labs with Fam MD. Harriet Masson done in 2021.  2. H/O total hysterectomy  3. Postmenopausal hormone replacement therapy Postmenopause, well on Estradiol patch 0.05 twice weekly x 4 years.  No CI to continue.  Prescription sent to pharmacy.  S/P  Total Hysterectomy, Left Oophorectomy, partial Right Oophorectomy.  No pelvic pain.  Abstinent.    4. Yeast infection of the skin Well on Nystatin cream.  5. Class 3 severe obesity due to excess calories with serious comorbidity and body mass index (BMI) of 45.0 to 49.9 in adult (HCC) BMI increased back to 45.6, but currently loosing weight at the [facility] x 2 months with Dr Renda Rolls. Increased fitness activities.  Has DM type 2.   Other orders - diclofenac (VOLTAREN) 75 MG EC tablet; Take 75 mg by mouth 2 (two) times daily. - LORazepam (ATIVAN) 0.5 MG tablet; Take 0.5 mg by mouth daily. - oxcarbazepine (TRILEPTAL) 600 MG tablet; Take 600 mg by mouth 2 (two) times daily. - estradiol (VIVELLE-DOT) 0.05 MG/24HR patch; Place 1 patch (0.05 mg total) onto the skin 2 (two) times a week.   Princess Bruins MD, 9:47 AM 11/06/2021

## 2021-11-10 ENCOUNTER — Telehealth (INDEPENDENT_AMBULATORY_CARE_PROVIDER_SITE_OTHER): Payer: BC Managed Care – PPO | Admitting: Internal Medicine

## 2021-11-10 ENCOUNTER — Encounter (INDEPENDENT_AMBULATORY_CARE_PROVIDER_SITE_OTHER): Payer: Self-pay | Admitting: Family Medicine

## 2021-11-10 ENCOUNTER — Encounter: Payer: Self-pay | Admitting: Family Medicine

## 2021-11-10 DIAGNOSIS — U071 COVID-19: Secondary | ICD-10-CM | POA: Diagnosis not present

## 2021-11-10 MED ORDER — MOLNUPIRAVIR EUA 200MG CAPSULE: 4.0000 | ORAL_CAPSULE | Freq: Two times a day (BID) | ORAL | 0 refills | Status: AC

## 2021-11-10 NOTE — Progress Notes (Signed)
Virtual Visit via Video Note  I connected with Gloria [name] on 11/10/21 at 10:30 AM EST by a video enabled telemedicine application and verified that I am speaking with the correct person using two identifiers.  Location patient: home Location provider: work office Persons participating in the virtual visit: patient, provider  I discussed the limitations of evaluation and management by telemedicine and the availability of in person appointments. The patient expressed understanding and agreed to proceed.   HPI: She called this to inform us that she has tested positive for COVID this morning.  Lives in assisted living, also has [place].  She has been having a stuffy head, chills, low-grade fevers, cough and body aches.   ROS: Constitutional: Positive for fever, chills, diaphoresis, appetite change and fatigue.  HEENT: Denies photophobia, eye pain, redness, hearing loss, mouth sores, trouble swallowing, neck pain, neck stiffness and tinnitus.   Respiratory: Denies  chest tightness,  and wheezing.   Cardiovascular: Denies chest pain, palpitations and leg swelling.  Gastrointestinal: Denies nausea, vomiting, abdominal pain, diarrhea, constipation, blood in stool and abdominal distention.  Genitourinary: Denies dysuria, urgency, frequency, hematuria, flank pain and difficulty urinating.  Endocrine: Denies: hot or cold intolerance, sweats, changes in hair or nails, polyuria, polydipsia. Musculoskeletal: Denies myalgias, back pain, joint swelling, arthralgias and gait problem.  Skin: Denies pallor, rash and wound.  Neurological: Denies dizziness, seizures, syncope, weakness, light-headedness, numbness and headaches.  Hematological: Denies adenopathy. Easy bruising, personal or family bleeding history  Psychiatric/Behavioral: Denies suicidal ideation, mood changes, confusion, nervousness, sleep disturbance and agitation   Past Medical History:  Diagnosis Date   Alcohol abuse     Allergy    Anemia    hx of- prior to hysterectomy   Anxiety    Arthritis    Bilateral swelling of feet    Bulging disc    X 2   Chronic active hepatitis (HCC)    Chronic back pain 02/25/2015   Chronic fatigue syndrome    Chronic headaches    Chronic kidney disease    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   DDD (degenerative disc disease), lumbar    Depression    Diabetes ([place])    Diverticulosis    Drug use    Elevated LFTs    Fibromyalgia    GERD (gastroesophageal reflux disease)    History of ETOH abuse    Hyperlipidemia    Hyperplastic colon polyp    Internal hemorrhoids    Migraines    NAFLD (nonalcoholic fatty liver disease)    Obese    Obstructive sleep apnea 02/25/2015   not diagnosed per pt 04-03-20   Osteoarthritis    lower back- DDD   Periodic limb movement disorder 02/25/2014   Pre-diabetes    pt taking metformin   RLS (restless legs syndrome)    Sleep apnea    Smoker    Snoring    Substance abuse ([place])    Swallowing difficulty    Vitamin B12 deficiency    Vitamin D deficiency     Past Surgical History:  Procedure Laterality Date   CERVICAL FUSION     COLONOSCOPY     CYSTOSCOPY  06/06/2013   Procedure: CYSTOSCOPY;  Surgeon: Terrance Mass, MD;  Location: [facility];  Service: Gynecology;;   EXCISION METACARPAL MASS Right 09/03/2021   Procedure: EXCISION METACARPAL MASS RIGHT SMALL FINGER;  Surgeon: Daryll Brod, MD;  Location: [place];  Service: Orthopedics;  Laterality: Right;   INTRAUTERINE DEVICE  INSERTION  08/09/2008   PARAGUARD- removed   kidney surgery  1995   abcess   LAPAROSCOPY N/A 06/06/2013   Procedure: LAPAROSCOPY DIAGNOSTIC;  Surgeon: Terrance Mass, MD;  Location: [facility];  Service: Gynecology;  Laterality: N/A;   LAPAROTOMY  06/06/2013   Procedure: EXPLORATORY LAPAROTOMY;  Surgeon: Terrance Mass, MD;  Location: [facility];  Service: Gynecology;;   LIVER BIOPSY     radial tunnel Bilateral 2005   VAGINAL HYSTERECTOMY Left  06/06/2013   Procedure: Vaginal Hysterectomy with Patiial Right Salpingectomy;  Surgeon: Terrance Mass, MD;  Location: [facility];  Service: Gynecology;  Laterality: Left;    Family History  Problem Relation Age of Onset   Cancer Mother    Breast cancer Mother    Depression Mother    Anxiety disorder Mother    Obesity Mother    Sleep apnea Mother    Obesity Father    Anxiety disorder Father    Cancer Father    Hyperlipidemia Father    Colon polyps Father    Diabetes Father    Cirrhosis Father    Liver cancer Father    Liver disease Father    Alcoholism Father    Depression Father    Alcohol abuse Father    Sleep apnea Father    Irritable bowel syndrome Sister    Sleep apnea Brother    Breast cancer Maternal Aunt    Alcoholism Maternal Aunt    Alcoholism Maternal Grandmother    Alcoholism Maternal Grandfather    Breast cancer Paternal Grandmother    Colon cancer Paternal Grandmother    Esophageal cancer Paternal Grandfather    Stomach cancer Paternal Grandfather    Alcoholism Paternal Grandfather    Rectal cancer Neg Hx     SOCIAL HX:   reports that she quit smoking about 9 years ago. Her smoking use included cigarettes. She smoked an average of 1 pack per day. She has never used smokeless tobacco. She reports that she does not drink alcohol and does not use drugs.   Current Outpatient Medications:    B Complex Vitamins (B COMPLEX PO), Take 1 tablet by mouth daily., Disp: , Rfl:    blood glucose meter kit and supplies KIT, Dispense based on patient and insurance preference. Use up to four times daily as directed. (FOR ICD-9 250.00, 250.01)., Disp: 1 each, Rfl: 0   Blood Glucose Monitoring Suppl (ONETOUCH VERIO FLEX SYSTEM) w/Device KIT, Use to check Blood sugars twice daily before meals, Disp: 1 kit, Rfl: 0   Brexpiprazole (REXULTI) 4 MG TABS, Take by mouth., Disp: , Rfl:    Calcium Carbonate-Vitamin D 600-400 MG-UNIT tablet, Take 1 tablet by mouth 2 (two) times daily. ,  Disp: , Rfl:    Cholecalciferol (VITAMIN D3) 50 MCG (2000 UT) TABS, Take by mouth., Disp: , Rfl:    clotrimazole-betamethasone (LOTRISONE) cream, Apply 1 application topically 2 (two) times daily. For 10 days maximum, Disp: 45 g, Rfl: 1   Coenzyme Q10 (COQ10) 100 MG CAPS, Take 100 mg by mouth daily., Disp: , Rfl:    diclofenac (VOLTAREN) 75 MG EC tablet, Take 75 mg by mouth 2 (two) times daily., Disp: , Rfl:    estradiol (VIVELLE-DOT) 0.05 MG/24HR patch, Place 1 patch (0.05 mg total) onto the skin 2 (two) times a week., Disp: 24 patch, Rfl: 4   fluticasone (CUTIVATE) 0.05 % cream, Apply topically 2 (two) times daily., Disp: , Rfl:    glipiZIDE (GLUCOTROL) 5 MG tablet,  TAKE 1 TABLET BY MOUTH DAILY AT 12 PM AT NOON, Disp: 90 tablet, Rfl: 1   glucose blood (CONTOUR NEXT TEST) test strip, Use to test blood glucose twice daily, Disp: 100 each, Rfl: 5   ibuprofen (ADVIL) 800 MG tablet, TAKE 1 TABLET(800 MG) BY MOUTH EVERY 8 HOURS AS NEEDED FOR MODERATE PAIN, Disp: 30 tablet, Rfl: 3   ketoconazole (NIZORAL) 2 % cream, Apply 1 application topically daily., Disp: , Rfl:    Lancets (ONETOUCH DELICA PLUS [id number]) MISC, USE 1  TO CHECK GLUCOSE 4 TIMES DAILY, Disp: 100 each, Rfl: 0   loratadine (CLARITIN) 10 MG tablet, Take 10 mg by mouth daily., Disp: , Rfl:    LORazepam (ATIVAN) 0.5 MG tablet, Take 0.5 mg by mouth daily., Disp: , Rfl:    Magnesium 500 MG CAPS, Take by mouth., Disp: , Rfl:    metroNIDAZOLE (METROCREAM) 0.75 % cream, daily, Disp: , Rfl:    molnupiravir EUA (LAGEVRIO) 200 mg CAPS capsule, Take 4 capsules (800 mg total) by mouth 2 (two) times daily for 5 days., Disp: 40 capsule, Rfl: 0   Multiple Vitamins-Minerals (MULTIVITAMIN PO), Take 1 tablet by mouth daily. , Disp: , Rfl:    OMEGA 3-6-9 FATTY ACIDS PO, Take 500 mg by mouth daily., Disp: , Rfl:    oxcarbazepine (TRILEPTAL) 600 MG tablet, Take 600 mg by mouth 2 (two) times daily., Disp: , Rfl:    pantoprazole (PROTONIX) 40 MG tablet, TAKE  1 TABLET BY MOUTH DAILY (Patient taking differently: as needed.), Disp: 90 tablet, Rfl: 0   rizatriptan (MAXALT) 10 MG tablet, TAKE 1 TABLET BY MOUTH AS NEEDED FOR MIGRAINE. MAY REPEAT IN 2 HOURS IF NEEDED, Disp: 10 tablet, Rfl: 5   Semaglutide,0.25 or 0.5MG/DOS, (OZEMPIC, 0.25 OR 0.5 MG/DOSE,) 2 MG/1.5ML SOPN, Inject 0.5 mg into the skin once a week., Disp: 1.5 mL, Rfl: 0   thiamine (VITAMIN B-1) 50 MG tablet, Take 50 mg by mouth daily., Disp: , Rfl:    vitamin C (ASCORBIC ACID) 500 MG tablet, Take 500 mg by mouth daily., Disp: , Rfl:    Vitamin D, Ergocalciferol, (DRISDOL) 1.25 MG (50000 UNIT) CAPS capsule, Take 1 capsule (50,000 Units total) by mouth every 7 (seven) days., Disp: 4 capsule, Rfl: 0   VITAMIN E PO, Take 900 mg by mouth daily., Disp: , Rfl:   Current Facility-Administered Medications:    0.9 %  sodium chloride infusion, 500 mL, Intravenous, Once, Pyrtle, Lajuan Lines, MD  EXAM:   VITALS per patient if applicable: None reported  GENERAL: alert, oriented, sounds congested, coughing throughout visit.  HEENT: atraumatic, conjunttiva clear, no obvious abnormalities on inspection of external nose and ears  NECK: normal movements of the head and neck  LUNGS: on inspection no signs of respiratory distress, breathing rate appears normal, no obvious gross increased work of breathing, gasping or wheezing  CV: no obvious cyanosis  MS: moves all visible extremities without noticeable abnormality  PSYCH/NEURO: pleasant and cooperative, no obvious depression or anxiety, speech and thought processing grossly intact  ASSESSMENT AND PLAN:   COVID-19  - Plan: molnupiravir EUA (LAGEVRIO) 200 mg CAPS capsule -She may also use OTC medications such as antihistamines, decongestants, pain relievers, guaifenesin. -We have reviewed quarantine period of 5 days. -We have discussed symptoms that would promote ED evaluation. -She knows to follow with [place] if symptoms fail to resolve.      I  discussed the assessment and treatment plan with the patient. The patient was provided an  opportunity to ask questions and all were answered. The patient agreed with the plan and demonstrated an understanding of the instructions.   The patient was advised to call back or seek an in-person evaluation if the symptoms worsen or if the condition fails to improve as anticipated.    Lelon Frohlich, MD  [facility]

## 2021-11-12 ENCOUNTER — Telehealth: Payer: Self-pay | Admitting: Family Medicine

## 2021-11-12 ENCOUNTER — Telehealth (INDEPENDENT_AMBULATORY_CARE_PROVIDER_SITE_OTHER): Payer: BC Managed Care – PPO | Admitting: Family Medicine

## 2021-11-12 ENCOUNTER — Encounter (INDEPENDENT_AMBULATORY_CARE_PROVIDER_SITE_OTHER): Payer: Self-pay | Admitting: Family Medicine

## 2021-11-12 DIAGNOSIS — E1169 Type 2 diabetes mellitus with other specified complication: Secondary | ICD-10-CM | POA: Diagnosis not present

## 2021-11-12 DIAGNOSIS — U071 COVID-19: Secondary | ICD-10-CM

## 2021-11-12 DIAGNOSIS — Z6841 Body Mass Index (BMI) 40.0 and over, adult: Secondary | ICD-10-CM | POA: Diagnosis not present

## 2021-11-12 MED ORDER — OZEMPIC (0.25 OR 0.5 MG/DOSE) 2 MG/1.5ML ~~LOC~~ SOPN
0.5000 mg | PEN_INJECTOR | SUBCUTANEOUS | 0 refills | Status: DC
Start: 2021-11-12 — End: 2021-12-09

## 2021-11-12 NOTE — Telephone Encounter (Signed)
Okay to give note 

## 2021-11-12 NOTE — Progress Notes (Signed)
TeleHealth Visit:  Due to the COVID-19 pandemic, this visit was completed with telemedicine (audio/video) technology to reduce patient and provider exposure as well as to preserve personal protective equipment.   Gloria [name] has verbally consented to this TeleHealth visit. The patient is located at home, the provider is located at the [facility] office. The participants in this visit include the listed provider and patient. The visit was conducted today via MyChart video.   Chief Complaint: OBESITY Gloria [name] is here to discuss her progress with her obesity treatment plan along with follow-up of her obesity related diagnoses. Gloria [name] is on the Category 2 Plan and states she is following her eating plan approximately 70% of the time. Gloria [name] states she is walking around more.   Today's visit was #: 7 Starting weight: 274 lbs Starting date: 08/07/2021  Interim History: Gloria [name] has been sick with COVID19 this week so her visit was changed to virtual. She has not been able to follow her eating plan of course, but she is doing well with hydration. As expected, her protein has decreased and liquid calories have increased.   Subjective:   1. Type 2 diabetes mellitus with other specified complication, without long-term current use of insulin (HCC) Gloria [name] is doing well on Ozempic. She is due to take it again this Saturday. She has not been eating much while sick.  2. COVID-19 virus infection Gloria [name] saw Dr. Deniece [name] by virtual visit earlier this week, and she was started on Lagevrio. She still feels poorly, but she thinks she is improving. Her worst symptoms are cough, pharyngitis, and fatigue.   Assessment/Plan:   1. Type 2 diabetes mellitus with other specified complication, without long-term current use of insulin (HCC) We will refill Ozempic for 1 month. Gloria [name] is to delay taking her Ozempic until she is feeling better and her appetite is more normal, as taking Ozempic and missing  meals will likely cause nausea. Good blood sugar control is important to decrease the likelihood of diabetic complications such as nephropathy, neuropathy, limb loss, blindness, coronary artery disease, and death. Intensive lifestyle modification including diet, exercise and weight loss are the first line of treatment for diabetes.   - Semaglutide,0.25 or 0.5MG/DOS, (OZEMPIC, 0.25 OR 0.5 MG/DOSE,) 2 MG/1.5ML SOPN; Inject 0.5 mg into the skin once a week.  Dispense: 1.5 mL; Refill: 0  2. COVID-19 virus infection Gloria [name] appears to be starting to recover, but she is still ill. She was advised to continue her medications, and work on hydration and rest as much as possible. She will contact her primary care physician if her symptoms worsen or fail to improve.  3. Obesity with current BMI of 44.43 Gloria [name] is currently in the action stage of change. As such, her goal is to continue with weight loss efforts. She has agreed to the Category 2 Plan + 100 calories.   Gloria [name]'s goal is to hydrate well for now, and not worry about her eating plan or exercise until she is feeling better.  Behavioral modification strategies: increasing water intake and better snacking choices.  Gloria [name] has agreed to follow-up with our clinic in 3 weeks. She was informed of the importance of frequent follow-up visits to maximize her success with intensive lifestyle modifications for her multiple health conditions.  Objective:   VITALS: Per patient if applicable, see vitals. GENERAL: Alert and in no acute distress. CARDIOPULMONARY: No increased WOB. Speaking in clear sentences.  PSYCH: Pleasant and cooperative. Speech normal rate and rhythm. Affect is appropriate.  Insight and judgement are appropriate. Attention is focused, linear, and appropriate.  NEURO: Oriented as arrived to appointment on time with no prompting.   Lab Results  Component Value Date   CREATININE 0.50 (L) 08/07/2021   BUN 8 08/07/2021   NA 138  08/07/2021   K 4.0 08/07/2021   CL 101 08/07/2021   CO2 24 08/07/2021   Lab Results  Component Value Date   ALT 13 08/07/2021   AST 13 08/07/2021   ALKPHOS 85 08/07/2021   BILITOT 0.2 08/07/2021   Lab Results  Component Value Date   HGBA1C 6.2 (H) 08/07/2021   HGBA1C 6.3 (A) 05/20/2021   HGBA1C 5.7 (H) 09/19/2020   HGBA1C 5.8 (A) 07/31/2020   HGBA1C 5.3 03/13/2020   Lab Results  Component Value Date   INSULIN 29.5 (H) 08/07/2021   Lab Results  Component Value Date   TSH 2.860 08/07/2021   Lab Results  Component Value Date   CHOL 247 (H) 08/07/2021   HDL 47 08/07/2021   LDLCALC 168 (H) 08/07/2021   LDLDIRECT 145.0 07/18/2019   TRIG 172 (H) 08/07/2021   CHOLHDL 6.1 (H) 09/19/2020   Lab Results  Component Value Date   VD25OH 45.2 08/07/2021   VD25OH 57.79 07/18/2019   VD25OH 29.8 (L) 01/11/2018   Lab Results  Component Value Date   WBC 10.4 08/07/2021   HGB 13.2 08/07/2021   HCT 40.2 08/07/2021   MCV 89 08/07/2021   PLT 284 08/07/2021   Lab Results  Component Value Date   IRON 54 10/06/2017   TIBC 304 10/06/2017   FERRITIN 151 (H) 10/06/2017    Attestation Statements:   Reviewed by clinician on day of visit: allergies, medications, problem list, medical history, surgical history, family history, social history, and previous encounter notes.   I, Gloria [name], am acting as transcriptionist for Gloria Nip, MD.  I have reviewed the above documentation for accuracy and completeness, and I agree with the above. - Gloria Nip, MD

## 2021-11-12 NOTE — Telephone Encounter (Signed)
Pt is calling and seen dr Jerilee Hoh on 11-10-2021 and would like work note to be put on her mychart. Pt needs note from 11-10-2021 and will return back to work on 11-16-2021 due to illness

## 2021-11-13 NOTE — Telephone Encounter (Signed)
Letter sent.

## 2021-11-19 ENCOUNTER — Other Ambulatory Visit: Payer: Self-pay

## 2021-11-19 ENCOUNTER — Ambulatory Visit (INDEPENDENT_AMBULATORY_CARE_PROVIDER_SITE_OTHER): Payer: BC Managed Care – PPO | Admitting: Psychiatry

## 2021-11-19 DIAGNOSIS — F3340 Major depressive disorder, recurrent, in remission, unspecified: Secondary | ICD-10-CM | POA: Diagnosis not present

## 2021-11-19 NOTE — Progress Notes (Signed)
°      [facility] Counselor/Therapist Progress Note  Patient ID: Gloria [name], MRN: [id number],    Date: 11/19/2021  Time Spent: 47 minutes start time 2:02 PM end time 2:49 PM  Treatment Type: Individual Therapy  Reported Symptoms: sadness, anxiety, crying spells  Mental Status Exam:  Appearance:   Well Groomed     Behavior:  Appropriate  Motor:  Normal  Speech/Language:   Normal Rate  Affect:  Appropriate  Mood:  normal  Thought process:  normal  Thought content:    WNL  Sensory/Perceptual disturbances:    WNL  Orientation:  oriented to person, place, time/date, and situation  Attention:  Good  Concentration:  Good  Memory:  WNL  Fund of knowledge:   Good  Insight:    Good  Judgment:   Good  Impulse Control:  Good   Risk Assessment: Danger to Self:  No Self-injurious Behavior: No Danger to Others: No Duty to Warn:no Physical Aggression / Violence:No  Access to Firearms a concern: No  Gang Involvement:No   Subjective: Patient was present for session.  She shared her mother had COVID and she has had COVID so she has not been able to see her mother.  She has seen her son and that has been good.  Patient stated she is still at the same job and that is progressing well even with her continual medical issues.  She did have issues with her back over the Thanksgiving but was able to get shots and that has helped.  Patient reported that she is participating in her [place] groups and that has been beneficial.  She is also realizing how many positive choices that she is made and how that has been good for her and her family.  Patient reported her goal for this year is to take care of her health.  She is started working with the weight clinic but has not been able to get herself completely focused.  Had patient think through the reasons she wanted to lose weight.  She was able to recognize that it is for her health.  Encouraged her to realize it is also for her son because if something  happens to her her addicted ex-husband would be the only parent left for him.  Patient was able to recognize the importance of reminding herself of that perspective.  Discussed other ways to stay focused and motivated to follow plans that will help her lose weight and be healthier.  Interventions: Cognitive Behavioral Therapy and Solution-Oriented/Positive Psychology  Diagnosis:   ICD-10-CM   1. MDD (recurrent major depressive disorder) in remission [facility])  F33.40       Plan: Patient is to use CBT and coping skills to work on developing a positive self image.  Patient is to practice self talk and remind herself of why she is wanting to lose weight to help keep her motivation.  Patient is to follow plans from session to work and exercise throughout her day.  Patient is to take medication as directed. Long-term goal: Develop a consistent positive self image Short-term goal: Formal realistic appropriate and attainable goals: Use positive self talk messages to build self-esteem-process areas of self-doubt    Lina Sayre, [facility]

## 2021-11-25 ENCOUNTER — Other Ambulatory Visit: Payer: Self-pay

## 2021-11-25 ENCOUNTER — Encounter (INDEPENDENT_AMBULATORY_CARE_PROVIDER_SITE_OTHER): Payer: Self-pay | Admitting: Family Medicine

## 2021-11-25 ENCOUNTER — Ambulatory Visit (INDEPENDENT_AMBULATORY_CARE_PROVIDER_SITE_OTHER): Payer: BC Managed Care – PPO | Admitting: Family Medicine

## 2021-11-25 VITALS — BP 138/74 | HR 79 | Ht 65.0 in | Wt 268.0 lb

## 2021-11-25 DIAGNOSIS — R609 Edema, unspecified: Secondary | ICD-10-CM

## 2021-11-25 DIAGNOSIS — E1169 Type 2 diabetes mellitus with other specified complication: Secondary | ICD-10-CM | POA: Diagnosis not present

## 2021-11-25 DIAGNOSIS — Z6841 Body Mass Index (BMI) 40.0 and over, adult: Secondary | ICD-10-CM

## 2021-11-26 NOTE — Progress Notes (Signed)
Chief Complaint:   OBESITY Gloria [name] is here to discuss her progress with her obesity treatment plan along with follow-up of her obesity related diagnoses. Gloria [name] is on the Category 2 Plan + 100 calories and states she is following her eating plan approximately 75% of the time. Gloria [name] states she is walking at the office.    Today's visit was #: 8 Starting weight: 274 lbs Starting date: 08/07/2021 Today's weight: 268 lbs Today's date: 11/25/2021 Total lbs lost to date: 6 Total lbs lost since last in-office visit: 0  Interim History: Gloria [name] is recovering from [place] and she is working on getting back on track. She is getting back to journaling bit she is retaining some fluid today.  Subjective:   1. Type 2 diabetes mellitus with other specified complication, without long-term current use of insulin (HCC) Gloria [name] is stable on her medications, and she is tolerating Ozempic with no side effects.  2. Edema, unspecified type Gloria [name] notes some increase in swelling and fluid retention. She is recovering from [place], and she is working on increasing her hydration.  Assessment/Plan:   1. Type 2 diabetes mellitus with other specified complication, without long-term current use of insulin (HCC) Gloria [name] will continue Ozempic and her diet, and she will follow up in 2-3 weeks as we may need to increase her dose at that time. Good blood sugar control is important to decrease the likelihood of diabetic complications such as nephropathy, neuropathy, limb loss, blindness, coronary artery disease, and death. Intensive lifestyle modification including diet, exercise and weight loss are the first line of treatment for diabetes.   2. Edema, unspecified type Gloria [name] was educated on intravascular versus extravascular fluid. She will work on increasing her water intake and decreasing her sodium. We will follow up in 2-3 weeks.   3. Obesity with current BMI of 44.6 Gloria [name] is currently in the action stage  of change. As such, her goal is to continue with weight loss efforts. She has agreed to keeping a food journal and adhering to recommended goals of 1200-1400 calories and 80+ grams of protein daily.   Exercise goals: As is.  Behavioral modification strategies: increasing lean protein intake, increasing water intake, and decreasing sodium intake.  Gloria [name] has agreed to follow-up with our clinic in 2 to 3 weeks. She was informed of the importance of frequent follow-up visits to maximize her success with intensive lifestyle modifications for her multiple health conditions.   Objective:   Blood pressure 138/74, pulse 79, height 5' 5"  (1.651 m), weight 268 lb (121.6 kg), last menstrual period 04/25/2013, SpO2 95 %. Body mass index is 44.6 kg/m.  General: Cooperative, alert, well developed, in no acute distress. HEENT: Conjunctivae and lids unremarkable. Cardiovascular: Regular rhythm.  Lungs: Normal work of breathing. Neurologic: No focal deficits.   Lab Results  Component Value Date   CREATININE 0.50 (L) 08/07/2021   BUN 8 08/07/2021   NA 138 08/07/2021   K 4.0 08/07/2021   CL 101 08/07/2021   CO2 24 08/07/2021   Lab Results  Component Value Date   ALT 13 08/07/2021   AST 13 08/07/2021   ALKPHOS 85 08/07/2021   BILITOT 0.2 08/07/2021   Lab Results  Component Value Date   HGBA1C 6.2 (H) 08/07/2021   HGBA1C 6.3 (A) 05/20/2021   HGBA1C 5.7 (H) 09/19/2020   HGBA1C 5.8 (A) 07/31/2020   HGBA1C 5.3 03/13/2020   Lab Results  Component Value Date   INSULIN 29.5 (H) 08/07/2021   Lab  Results  Component Value Date   TSH 2.860 08/07/2021   Lab Results  Component Value Date   CHOL 247 (H) 08/07/2021   HDL 47 08/07/2021   LDLCALC 168 (H) 08/07/2021   LDLDIRECT 145.0 07/18/2019   TRIG 172 (H) 08/07/2021   CHOLHDL 6.1 (H) 09/19/2020   Lab Results  Component Value Date   VD25OH 45.2 08/07/2021   VD25OH 57.79 07/18/2019   VD25OH 29.8 (L) 01/11/2018   Lab Results   Component Value Date   WBC 10.4 08/07/2021   HGB 13.2 08/07/2021   HCT 40.2 08/07/2021   MCV 89 08/07/2021   PLT 284 08/07/2021   Lab Results  Component Value Date   IRON 54 10/06/2017   TIBC 304 10/06/2017   FERRITIN 151 (H) 10/06/2017   Attestation Statements:   Reviewed by clinician on day of visit: allergies, medications, problem list, medical history, surgical history, family history, social history, and previous encounter notes.  Time spent on visit including pre-visit chart review and post-visit care and charting was 31 minutes.    I, Trixie Dredge, am acting as transcriptionist for Dennard Nip, MD.  I have reviewed the above documentation for accuracy and completeness, and I agree with the above. -  Dennard Nip, MD

## 2021-12-02 DIAGNOSIS — F331 Major depressive disorder, recurrent, moderate: Secondary | ICD-10-CM | POA: Diagnosis not present

## 2021-12-02 DIAGNOSIS — F3181 Bipolar II disorder: Secondary | ICD-10-CM | POA: Diagnosis not present

## 2021-12-02 DIAGNOSIS — F411 Generalized anxiety disorder: Secondary | ICD-10-CM | POA: Diagnosis not present

## 2021-12-07 DIAGNOSIS — G4733 Obstructive sleep apnea (adult) (pediatric): Secondary | ICD-10-CM | POA: Diagnosis not present

## 2021-12-09 ENCOUNTER — Other Ambulatory Visit: Payer: Self-pay

## 2021-12-09 ENCOUNTER — Ambulatory Visit (INDEPENDENT_AMBULATORY_CARE_PROVIDER_SITE_OTHER): Payer: BC Managed Care – PPO | Admitting: Physician Assistant

## 2021-12-09 ENCOUNTER — Encounter (INDEPENDENT_AMBULATORY_CARE_PROVIDER_SITE_OTHER): Payer: Self-pay | Admitting: Physician Assistant

## 2021-12-09 VITALS — BP 118/67 | HR 76 | Temp 97.9°F | Ht 65.0 in | Wt 263.0 lb

## 2021-12-09 DIAGNOSIS — E1169 Type 2 diabetes mellitus with other specified complication: Secondary | ICD-10-CM

## 2021-12-09 DIAGNOSIS — Z6841 Body Mass Index (BMI) 40.0 and over, adult: Secondary | ICD-10-CM

## 2021-12-09 DIAGNOSIS — Z9189 Other specified personal risk factors, not elsewhere classified: Secondary | ICD-10-CM

## 2021-12-09 DIAGNOSIS — E559 Vitamin D deficiency, unspecified: Secondary | ICD-10-CM | POA: Diagnosis not present

## 2021-12-09 DIAGNOSIS — E669 Obesity, unspecified: Secondary | ICD-10-CM | POA: Diagnosis not present

## 2021-12-09 MED ORDER — OZEMPIC (0.25 OR 0.5 MG/DOSE) 2 MG/1.5ML ~~LOC~~ SOPN: 0.5000 mg | PEN_INJECTOR | SUBCUTANEOUS | 0 refills | Status: DC

## 2021-12-09 MED ORDER — VITAMIN D (ERGOCALCIFEROL) 1.25 MG (50000 UNIT) PO CAPS: 50000.0000 [IU] | ORAL_CAPSULE | ORAL | 0 refills | Status: DC

## 2021-12-09 NOTE — Progress Notes (Signed)
Chief Complaint:   OBESITY Gloria [name] is here to discuss her progress with her obesity treatment plan along with follow-up of her obesity related diagnoses. Gloria [name] is on keeping a food journal and adhering to recommended goals of 1200-1400 calories and 80 grams protein and states she is following her eating plan approximately 70% of the time. Gloria [name] states she is walking 15 minutes 5 times per week.  Today's visit was #: 9 Starting weight: 274 lbs Starting date: 08/07/2021 Today's weight: 263 lbs Today's date: 12/09/2021 Total lbs lost to date: 11 Total lbs lost since last in-office visit: 5  Interim History: Pt did well with weight loss. She states that she did not journal the last 10 days. When she does journal, she is averaging 1100 calories and 80 grams of protein daily.  Subjective:   1. Vitamin D deficiency Pt is on Vit D weekly.  2. Type 2 diabetes mellitus with other specified complication, without long-term current use of insulin (HCC) Pt is on Ozempic 0.5 mg. Her last A1c was 6.2 and well controlled. One day in the last 2 weeks, she felt as if she was hypoglycemic.   3. At risk for hypoglycemia Gloria [name] is at increased risk for hypoglycemia due to changes in diet, diagnosis of diabetes, and/or insulin use.   Assessment/Plan:   1. Vitamin D deficiency Low Vitamin D level contributes to fatigue and are associated with obesity, breast, and colon cancer. She agrees to continue to take prescription Vitamin D 50,000 IU every week and will follow-up for routine testing of Vitamin D, at least 2-3 times per year to avoid over-replacement.  Refill- Vitamin D, Ergocalciferol, (DRISDOL) 1.25 MG (50000 UNIT) CAPS capsule; Take 1 capsule (50,000 Units total) by mouth every 7 (seven) days.  Dispense: 4 capsule; Refill: 0  2. Type 2 diabetes mellitus with other specified complication, without long-term current use of insulin (HCC) Good blood sugar control is important to decrease the  likelihood of diabetic complications such as nephropathy, neuropathy, limb loss, blindness, coronary artery disease, and death. Intensive lifestyle modification including diet, exercise and weight loss are the first line of treatment for diabetes.  Holidays are over- discontinue Glipizide.  Refill- Semaglutide,0.25 or 0.5MG/DOS, (OZEMPIC, 0.25 OR 0.5 MG/DOSE,) 2 MG/1.5ML SOPN; Inject 0.5 mg into the skin once a week.  Dispense: 1.5 mL; Refill: 0  3. At risk for hypoglycemia Gloria [name] was given approximately 15 minutes of counseling today regarding prevention of hypoglycemia. She was advised of symptoms of hypoglycemia. Gloria [name] was instructed to avoid skipping meals, eat regular protein rich meals and schedule low calorie snacks as needed.   Repetitive spaced learning was employed today to elicit superior memory formation and behavioral change  4. Morbid Obesity with current BMI of 43.77  Gloria [name] is currently in the action stage of change. As such, her goal is to continue with weight loss efforts. She has agreed to the Category 2 Plan and keeping a food journal and adhering to recommended goals of 1200-1400 calories and 80 grams protein.   Fasting labs next visit.  Exercise goals:  As is  Behavioral modification strategies: meal planning and cooking strategies and planning for success.  Gloria [name] has agreed to follow-up with our clinic in 3 weeks. She was informed of the importance of frequent follow-up visits to maximize her success with intensive lifestyle modifications for her multiple health conditions.   Objective:   Blood pressure 118/67, pulse 76, temperature 97.9 F (36.6 C), height 5' 5"  (1.651 m),  weight 263 lb (119.3 kg), last menstrual period 04/25/2013, SpO2 95 %. Body mass index is 43.77 kg/m.  General: Cooperative, alert, well developed, in no acute distress. HEENT: Conjunctivae and lids unremarkable. Cardiovascular: Regular rhythm.  Lungs: Normal work of  breathing. Neurologic: No focal deficits.   Lab Results  Component Value Date   CREATININE 0.50 (L) 08/07/2021   BUN 8 08/07/2021   NA 138 08/07/2021   K 4.0 08/07/2021   CL 101 08/07/2021   CO2 24 08/07/2021   Lab Results  Component Value Date   ALT 13 08/07/2021   AST 13 08/07/2021   ALKPHOS 85 08/07/2021   BILITOT 0.2 08/07/2021   Lab Results  Component Value Date   HGBA1C 6.2 (H) 08/07/2021   HGBA1C 6.3 (A) 05/20/2021   HGBA1C 5.7 (H) 09/19/2020   HGBA1C 5.8 (A) 07/31/2020   HGBA1C 5.3 03/13/2020   Lab Results  Component Value Date   INSULIN 29.5 (H) 08/07/2021   Lab Results  Component Value Date   TSH 2.860 08/07/2021   Lab Results  Component Value Date   CHOL 247 (H) 08/07/2021   HDL 47 08/07/2021   LDLCALC 168 (H) 08/07/2021   LDLDIRECT 145.0 07/18/2019   TRIG 172 (H) 08/07/2021   CHOLHDL 6.1 (H) 09/19/2020   Lab Results  Component Value Date   VD25OH 45.2 08/07/2021   VD25OH 57.79 07/18/2019   VD25OH 29.8 (L) 01/11/2018   Lab Results  Component Value Date   WBC 10.4 08/07/2021   HGB 13.2 08/07/2021   HCT 40.2 08/07/2021   MCV 89 08/07/2021   PLT 284 08/07/2021   Lab Results  Component Value Date   IRON 54 10/06/2017   TIBC 304 10/06/2017   FERRITIN 151 (H) 10/06/2017   Attestation Statements:   Reviewed by clinician on day of visit: allergies, medications, problem list, medical history, surgical history, family history, social history, and previous encounter notes.  Coral Ceo, CMA, am acting as transcriptionist for [employer], PA-C.  I have reviewed the above documentation for accuracy and completeness, and I agree with the above. Abby Potash, PA-C

## 2021-12-15 ENCOUNTER — Other Ambulatory Visit (INDEPENDENT_AMBULATORY_CARE_PROVIDER_SITE_OTHER): Payer: Self-pay | Admitting: Family Medicine

## 2021-12-15 ENCOUNTER — Telehealth (INDEPENDENT_AMBULATORY_CARE_PROVIDER_SITE_OTHER): Payer: Self-pay

## 2021-12-15 DIAGNOSIS — L400 Psoriasis vulgaris: Secondary | ICD-10-CM | POA: Diagnosis not present

## 2021-12-15 DIAGNOSIS — E1169 Type 2 diabetes mellitus with other specified complication: Secondary | ICD-10-CM

## 2021-12-15 DIAGNOSIS — L821 Other seborrheic keratosis: Secondary | ICD-10-CM | POA: Diagnosis not present

## 2021-12-15 DIAGNOSIS — D485 Neoplasm of uncertain behavior of skin: Secondary | ICD-10-CM | POA: Diagnosis not present

## 2021-12-15 DIAGNOSIS — L538 Other specified erythematous conditions: Secondary | ICD-10-CM | POA: Diagnosis not present

## 2021-12-15 DIAGNOSIS — D225 Melanocytic nevi of trunk: Secondary | ICD-10-CM | POA: Diagnosis not present

## 2021-12-15 DIAGNOSIS — L814 Other melanin hyperpigmentation: Secondary | ICD-10-CM | POA: Diagnosis not present

## 2021-12-15 MED ORDER — OZEMPIC (0.25 OR 0.5 MG/DOSE) 2 MG/1.5ML ~~LOC~~ SOPN: 0.5000 mg | PEN_INJECTOR | SUBCUTANEOUS | 0 refills | Status: DC

## 2021-12-15 NOTE — Telephone Encounter (Signed)
Pt called in and stated that [facility] on [address] canceled her order for her Ozempic, the pt is requesting a new one sent [facility] on [address]. Please Advise

## 2021-12-15 NOTE — Telephone Encounter (Signed)
Pt last seen by Tracey Aguilar, PA-C.  

## 2021-12-22 ENCOUNTER — Encounter: Payer: Self-pay | Admitting: Internal Medicine

## 2021-12-22 ENCOUNTER — Ambulatory Visit (INDEPENDENT_AMBULATORY_CARE_PROVIDER_SITE_OTHER): Payer: BC Managed Care – PPO | Admitting: Internal Medicine

## 2021-12-22 VITALS — BP 110/80 | HR 82 | Ht 65.0 in | Wt 267.0 lb

## 2021-12-22 DIAGNOSIS — K7581 Nonalcoholic steatohepatitis (NASH): Secondary | ICD-10-CM | POA: Diagnosis not present

## 2021-12-22 NOTE — Patient Instructions (Signed)
Continue current medications and lifestyle modifications.  Please follow up in 1 year with Dr Hilarie Fredrickson.  If you are age 52 or older, your body mass index should be between 23-30. Your Body mass index is 44.43 kg/m. If this is out of the aforementioned range listed, please consider follow up with your Primary Care Provider.  If you are age 52 or younger, your body mass index should be between 19-25. Your Body mass index is 44.43 kg/m. If this is out of the aformentioned range listed, please consider follow up with your Primary Care Provider.   ________________________________________________________  The [facility] providers would like to encourage you to use [facility] to communicate with providers for non-urgent requests or questions.  Due to long hold times on the telephone, sending your provider a message by [facility] may be a faster and more efficient way to get a response.  Please allow 48 business hours for a response.  Please remember that this is for non-urgent requests.  _______________________________________________________  Due to recent changes in healthcare laws, you may see the results of your imaging and laboratory studies on MyChart before your provider has had a chance to review them.  We understand that in some cases there may be results that are confusing or concerning to you. Not all laboratory results come back in the same time frame and the provider may be waiting for multiple results in order to interpret others.  Please give [place] 48 hours in order for your provider to thoroughly review all the results before contacting the office for clarification of your results.

## 2021-12-22 NOTE — Progress Notes (Signed)
Subjective:    Patient ID: Gloria [name], female    DOB: September 06, 1971, 51 y.o.   MRN: [id number]  HPI Gloria [name] is a 51 year old female with NASH (F2 fibrosis by biopsy in 2018), prior history of alcohol abuse in remission x13 years, history of SSP's, GERD, hypertension, hyperlipidemia, diabetes, sleep apnea who is here for follow-up.  She is here alone today.  I last saw her in August 2021.  She reports that she has been doing well.  She is brief periodic right upper quadrant pains.  This happens maybe once every 3 weeks.  Not nocturnal.  Not associated definitively with eating.  Not related to activity.  Not associated with nausea or vomiting.  No jaundice, itching, increasing abdominal girth or lower extremity edema issues.  She has established with Dr. Leafy Ro with healthy weight and wellness.  She was referred by Dr. Alvan Dame because he recommended weight loss so that her left hip could be eventually replaced.  When she started with Dr. Leafy Ro her weight was 289 and she was started on Ozempic.  She is taking 0.5 dose once weekly.  She has tolerated this well.  Her food cravings are down significantly.  She is eating a high-protein diet.  She is not having nausea or early satiety.  Bowel movements have been regular.   Review of Systems As per HPI, otherwise negative  Current Medications, Allergies, Past Medical History, Past Surgical History, Family History and Social History were reviewed in [employer] record.     Objective:   Physical Exam BP 110/80    Pulse 82    Ht 5' 5"  (1.651 m)    Wt 267 lb (121.1 kg)    LMP 04/25/2013    BMI 44.43 kg/m  Constitutional: Well-developed and well-nourished. No distress. HEENT: Normocephalic and atraumatic.  Conjunctivae are normal.  No scleral icterus. Cardiovascular: Normal rate, regular rhythm and intact distal pulses. No M/R/G Pulmonary/chest: Effort normal and breath sounds normal. No wheezing, rales or  rhonchi. Abdominal: Soft, nontender, nondistended. Bowel sounds active throughout. There are no masses palpable. No hepatosplenomegaly. Extremities: no clubbing, cyanosis, or edema Neurological: Alert and oriented to person place and time. Skin: Skin is warm and dry. Psychiatric: Normal mood and affect. Behavior is normal.  CMP     Component Value Date/Time   NA 138 08/07/2021 0000   K 4.0 08/07/2021 0000   CL 101 08/07/2021 0000   CO2 24 08/07/2021 0000   GLUCOSE 117 (H) 08/07/2021 0000   GLUCOSE 139 (H) 09/19/2020 0847   BUN 8 08/07/2021 0000   CREATININE 0.50 (L) 08/07/2021 0000   CREATININE 0.61 09/19/2020 0847   CALCIUM 8.8 08/07/2021 0000   PROT 6.7 08/07/2021 0000   ALBUMIN 4.1 08/07/2021 0000   AST 13 08/07/2021 0000   ALT 13 08/07/2021 0000   ALKPHOS 85 08/07/2021 0000   BILITOT 0.2 08/07/2021 0000   GFRNONAA 106 09/19/2020 0847   GFRAA 123 09/19/2020 0847   Lab Results  Component Value Date   INR 1.0 07/21/2020   INR 0.99 12/30/2016   INR 1.02 06/06/2013   CBC Latest Ref Rng & Units 08/07/2021 09/19/2020 07/21/2020  WBC 3.4 - 10.8 x10E3/uL 10.4 9.3 10.7(H)  Hemoglobin 11.1 - 15.9 g/dL 13.2 13.9 13.2  Hematocrit 34.0 - 46.6 % 40.2 42.0 40.4  Platelets 150 - 450 x10E3/uL 284 288 274.0   MRI ABDOMEN WITHOUT AND WITH CONTRAST   TECHNIQUE: Multiplanar multisequence MR imaging of the abdomen was performed  both before and after the administration of intravenous contrast.   CONTRAST:  11m GADAVIST GADOBUTROL 1 MMOL/ML IV SOLN   COMPARISON:  Multiple exams, including abdominal ultrasound 07/30/2020 and prior MRI from 03/27/2013   FINDINGS: Despite efforts by the technologist and patient, motion artifact is present on today's exam and could not be eliminated. This reduces exam sensitivity and specificity. Body habitus reduces diagnostic sensitivity and specificity.   Lower chest: Unremarkable   Hepatobiliary: Mild hepatic steatosis. No specific  morphologic indicators of cirrhosis. Multiple gallstones mostly measuring around 1 cm in diameter. No biliary dilatation or choledocholithiasis identified. The liver measures 19.9 cm craniocaudad. No significant focal liver lesions are identified.   Pancreas:  Unremarkable   Spleen:  Unremarkable   Adrenals/Urinary Tract: Both adrenal glands appear normal. Stable postoperative findings along the right kidney upper pole with the region partially obscured by metal artifact from clips. The kidneys appear otherwise unremarkable.   Stomach/Bowel: Unremarkable   Vascular/Lymphatic:  Unremarkable   Other:  No supplemental non-categorized findings.   Musculoskeletal: Small left posterior disc protrusion L2-3. Reduced intervertebral disc height at L3-4 and L4-5.   IMPRESSION: 1. Borderline hepatomegaly with mild hepatic steatosis but no focal parenchymal liver lesion. 2. Cholelithiasis. No choledocholithiasis identified. No biliary dilatation. 3. Degenerative disc disease in the lumbar spine. 4. Stable postoperative findings along the right kidney upper pole, partially obscured by metal artifact from clip.     Electronically Signed   By: WVan ClinesM.D.   On: 08/31/2021 06:23        Assessment & Plan:  51year old female with NASH (F2 fibrosis by biopsy in 2018), prior history of alcohol abuse in remission x13 years, history of SSP's, GERD, hypertension, hyperlipidemia, diabetes, sleep apnea who is here for follow-up.   1.  NASH/NAFLD --biopsy-proven but certainly no evidence of advanced fibrosis or cirrhosis.  Her albumin, INR and platelets are normal.  There is no jaundice.  She has been successful at losing weight and she is congratulated on this.  Goal would be 10% weight reduction slowly.  I believe Ozempic will truly help.  There may be room to push this dose as tolerated to help enhance further weight loss.  Her liver enzymes are very normal, and teens, and this is a  good sign in patients with fatty liver disease.  With recent MRI there is no evidence of cirrhosis or portal hypertension and there is actually slightly lower steatosis compared to previous imaging. --Recent lab work as above --MRI without evidence of cirrhosis --She is working on risk factors specifically weight by using Ozempic, starting an exercise regimen under direction of Dr. BLeafy Ro  We discussed the importance also of maintaining normal triglycerides and lipids as well as blood pressure and blood sugars. --Vitamin E is equivocal in patients with diabetes but not harmful.  She will continue 800 IU daily --Annual follow-up with me  2.  History of colon polyps --surveillance recommended around May 2026  30 minutes total spent today including patient facing time, coordination of care, reviewing medical history/procedures/pertinent radiology studies, and documentation of the encounter.

## 2021-12-25 ENCOUNTER — Ambulatory Visit: Payer: BC Managed Care – PPO | Admitting: Internal Medicine

## 2021-12-28 ENCOUNTER — Ambulatory Visit (INDEPENDENT_AMBULATORY_CARE_PROVIDER_SITE_OTHER): Payer: BC Managed Care – PPO | Admitting: Family Medicine

## 2021-12-28 ENCOUNTER — Encounter (INDEPENDENT_AMBULATORY_CARE_PROVIDER_SITE_OTHER): Payer: Self-pay | Admitting: Family Medicine

## 2021-12-28 ENCOUNTER — Other Ambulatory Visit: Payer: Self-pay

## 2021-12-28 VITALS — BP 138/83 | HR 68 | Temp 98.0°F | Ht 65.0 in | Wt 264.0 lb

## 2021-12-28 DIAGNOSIS — E782 Mixed hyperlipidemia: Secondary | ICD-10-CM

## 2021-12-28 DIAGNOSIS — Z9189 Other specified personal risk factors, not elsewhere classified: Secondary | ICD-10-CM

## 2021-12-28 DIAGNOSIS — E559 Vitamin D deficiency, unspecified: Secondary | ICD-10-CM | POA: Diagnosis not present

## 2021-12-28 DIAGNOSIS — E669 Obesity, unspecified: Secondary | ICD-10-CM | POA: Diagnosis not present

## 2021-12-28 DIAGNOSIS — E1169 Type 2 diabetes mellitus with other specified complication: Secondary | ICD-10-CM

## 2021-12-28 DIAGNOSIS — Z6841 Body Mass Index (BMI) 40.0 and over, adult: Secondary | ICD-10-CM

## 2021-12-28 NOTE — Progress Notes (Signed)
Chief Complaint:   OBESITY Gloria [name] is here to discuss her progress with her obesity treatment plan along with follow-up of her obesity related diagnoses. [name] is on the Category 2 Plan or keeping a food journal and adhering to recommended goals of 1200-1400 calories and 80 grams of protein daily and states she is following her eating plan approximately 50% of the time. [name] states she is walking for 30 minutes 5 times per week.  Today's visit was #: 10 Starting weight: 274 lbs Starting date: 08/07/2021 Today's weight: 264 lbs Today's date: 12/28/2021 Total lbs lost to date: 10 Total lbs lost since last in-office visit: 0  Interim History: Gloria [name] has had extra challenges recently, especially with increased hunger. She has done well overall with weight loss previously.   Subjective:   1. Type 2 diabetes mellitus with other specified complication, without long-term current use of insulin (HCC) [name] is on Ozempic at 0.5 mg. Her glipizide was discontinued at her last visit and she notes increased polyphagia, which is surprising. She has no signs of hypoglycemia. She didn't bring her blood sugar log today.  2. Mixed hyperlipidemia [name] is not on statin, and she has a history of liver damage. She is due for labs, and she denies chest pain.  3. Vitamin D deficiency Gloria [name] is on Vit D, but her level is not yet at goal. She is due for labs.  4. At risk for heart disease Gloria [name] is at higher than average risk for cardiovascular disease due to obesity.  Assessment/Plan:   1. Type 2 diabetes mellitus with other specified complication, without long-term current use of insulin (HCC) We will check labs today. Delana agreed to increase Ozempic to 1 mg weekly, and she is to check her blood sugars regularly. She will continue to hold glipizide for now. Good blood sugar control is important to decrease the likelihood of diabetic complications such as nephropathy, neuropathy, limb  loss, blindness, coronary artery disease, and death. Intensive lifestyle modification including diet, exercise and weight loss are the first line of treatment for diabetes.   - CMP14+EGFR - Insulin, random - Hemoglobin A1c - Microalbumin / creatinine urine ratio  2. Mixed hyperlipidemia Cardiovascular risk and specific lipid/LDL goals reviewed. We discussed several lifestyle modifications today. We will check labs today, and will continue to follow. [name] will continue diet, exercise and weight loss efforts. Orders and follow up as documented in patient record.   - Lipid Panel With LDL/HDL Ratio  3. Vitamin D deficiency Low Vitamin D level contributes to fatigue and are associated with obesity, breast, and colon cancer. We will check labs today. [name] will follow-up for routine testing of Vitamin D, at least 2-3 times per year to avoid over-replacement.  - VITAMIN D 25 Hydroxy (Vit-D Deficiency, Fractures)  4. At risk for heart disease Gloria [name] was given approximately 15 minutes of coronary artery disease prevention counseling today. She is 51 y.o. female and has risk factors for heart disease including obesity. We discussed intensive lifestyle modifications today with an emphasis on specific weight loss instructions and strategies.  Repetitive spaced learning was employed today to elicit superior memory formation and behavioral change.   5. Morbid Obesity with current BMI of 44.0 Gloria [name] is currently in the action stage of change. As such, her goal is to continue with weight loss efforts. She has agreed to the Category 2 Plan or keeping a food journal and adhering to recommended goals of 1200-1400 calories and 80+ grams of protein  daily.   Exercise goals: As is.  Behavioral modification strategies: increasing lean protein intake and keeping a strict food journal.  Gloria [name] has agreed to follow-up with our clinic in 3 weeks. She was informed of the importance of frequent follow-up  visits to maximize her success with intensive lifestyle modifications for her multiple health conditions.   Gloria [name] was informed we would discuss her lab results at her next visit unless there is a critical issue that needs to be addressed sooner. Gloria [name] agreed to keep her next visit at the agreed upon time to discuss these results.  Objective:   Blood pressure 138/83, pulse 68, temperature 98 F (36.7 C), height 5' 5"  (1.651 m), weight 264 lb (119.7 kg), last menstrual period 04/25/2013, SpO2 98 %. Body mass index is 43.93 kg/m.  General: Cooperative, alert, well developed, in no acute distress. HEENT: Conjunctivae and lids unremarkable. Cardiovascular: Regular rhythm.  Lungs: Normal work of breathing. Neurologic: No focal deficits.   Lab Results  Component Value Date   CREATININE 0.50 (L) 08/07/2021   BUN 8 08/07/2021   NA 138 08/07/2021   K 4.0 08/07/2021   CL 101 08/07/2021   CO2 24 08/07/2021   Lab Results  Component Value Date   ALT 13 08/07/2021   AST 13 08/07/2021   ALKPHOS 85 08/07/2021   BILITOT 0.2 08/07/2021   Lab Results  Component Value Date   HGBA1C 6.2 (H) 08/07/2021   HGBA1C 6.3 (A) 05/20/2021   HGBA1C 5.7 (H) 09/19/2020   HGBA1C 5.8 (A) 07/31/2020   HGBA1C 5.3 03/13/2020   Lab Results  Component Value Date   INSULIN 29.5 (H) 08/07/2021   Lab Results  Component Value Date   TSH 2.860 08/07/2021   Lab Results  Component Value Date   CHOL 247 (H) 08/07/2021   HDL 47 08/07/2021   LDLCALC 168 (H) 08/07/2021   LDLDIRECT 145.0 07/18/2019   TRIG 172 (H) 08/07/2021   CHOLHDL 6.1 (H) 09/19/2020   Lab Results  Component Value Date   VD25OH 45.2 08/07/2021   VD25OH 57.79 07/18/2019   VD25OH 29.8 (L) 01/11/2018   Lab Results  Component Value Date   WBC 10.4 08/07/2021   HGB 13.2 08/07/2021   HCT 40.2 08/07/2021   MCV 89 08/07/2021   PLT 284 08/07/2021   Lab Results  Component Value Date   IRON 54 10/06/2017   TIBC 304 10/06/2017    FERRITIN 151 (H) 10/06/2017   Attestation Statements:   Reviewed by clinician on day of visit: allergies, medications, problem list, medical history, surgical history, family history, social history, and previous encounter notes.   I, Trixie Dredge, am acting as transcriptionist for Dennard Nip, MD.  I have reviewed the above documentation for accuracy and completeness, and I agree with the above. -  Dennard Nip, MD

## 2021-12-29 ENCOUNTER — Encounter (INDEPENDENT_AMBULATORY_CARE_PROVIDER_SITE_OTHER): Payer: Self-pay | Admitting: Family Medicine

## 2021-12-29 LAB — HEMOGLOBIN A1C
Est. average glucose Bld gHb Est-mCnc: 114 mg/dL
Hgb A1c MFr Bld: 5.6 % (ref 4.8–5.6)

## 2021-12-29 LAB — CMP14+EGFR
ALT: 15 IU/L (ref 0–32)
AST: 17 IU/L (ref 0–40)
Albumin/Globulin Ratio: 1.9 (ref 1.2–2.2)
Albumin: 4.4 g/dL (ref 3.8–4.9)
Alkaline Phosphatase: 82 IU/L (ref 44–121)
BUN/Creatinine Ratio: 13 (ref 9–23)
BUN: 9 mg/dL (ref 6–24)
Bilirubin Total: 0.4 mg/dL (ref 0.0–1.2)
CO2: 25 mmol/L (ref 20–29)
Calcium: 9 mg/dL (ref 8.7–10.2)
Chloride: 94 mmol/L — ABNORMAL LOW (ref 96–106)
Creatinine, Ser: 0.67 mg/dL (ref 0.57–1.00)
Globulin, Total: 2.3 g/dL (ref 1.5–4.5)
Glucose: 110 mg/dL — ABNORMAL HIGH (ref 70–99)
Potassium: 4.4 mmol/L (ref 3.5–5.2)
Sodium: 131 mmol/L — ABNORMAL LOW (ref 134–144)
Total Protein: 6.7 g/dL (ref 6.0–8.5)
eGFR: 106 mL/min/{1.73_m2} (ref >59)

## 2021-12-29 LAB — LIPID PANEL WITH LDL/HDL RATIO
Cholesterol, Total: 222 mg/dL — ABNORMAL HIGH (ref 100–199)
HDL: 43 mg/dL (ref >39)
LDL Chol Calc (NIH): 144 mg/dL — ABNORMAL HIGH (ref 0–99)
LDL/HDL Ratio: 3.3 ratio — ABNORMAL HIGH (ref 0.0–3.2)
Triglycerides: 192 mg/dL — ABNORMAL HIGH (ref 0–149)
VLDL Cholesterol Cal: 35 mg/dL (ref 5–40)

## 2021-12-29 LAB — VITAMIN D 25 HYDROXY (VIT D DEFICIENCY, FRACTURES): Vit D, 25-Hydroxy: 60 ng/mL (ref 30.0–100.0)

## 2021-12-29 LAB — INSULIN, RANDOM: INSULIN: 30.5 u[IU]/mL — ABNORMAL HIGH (ref 2.6–24.9)

## 2021-12-30 DIAGNOSIS — F3181 Bipolar II disorder: Secondary | ICD-10-CM | POA: Diagnosis not present

## 2021-12-30 DIAGNOSIS — F411 Generalized anxiety disorder: Secondary | ICD-10-CM | POA: Diagnosis not present

## 2021-12-30 DIAGNOSIS — F331 Major depressive disorder, recurrent, moderate: Secondary | ICD-10-CM | POA: Diagnosis not present

## 2022-01-04 ENCOUNTER — Encounter (INDEPENDENT_AMBULATORY_CARE_PROVIDER_SITE_OTHER): Payer: Self-pay | Admitting: Family Medicine

## 2022-01-04 DIAGNOSIS — E1169 Type 2 diabetes mellitus with other specified complication: Secondary | ICD-10-CM

## 2022-01-05 MED ORDER — SEMAGLUTIDE (1 MG/DOSE) 4 MG/3ML ~~LOC~~ SOPN: 1.0000 mg | PEN_INJECTOR | SUBCUTANEOUS | 0 refills | Status: DC

## 2022-01-05 NOTE — Telephone Encounter (Signed)
Please see message and advise.  Thank you. Ok to send 1m?

## 2022-01-05 NOTE — Telephone Encounter (Signed)
Dr.Beasley 

## 2022-01-05 NOTE — Telephone Encounter (Signed)
Yes

## 2022-01-13 ENCOUNTER — Encounter (INDEPENDENT_AMBULATORY_CARE_PROVIDER_SITE_OTHER): Payer: Self-pay | Admitting: Physician Assistant

## 2022-01-13 ENCOUNTER — Ambulatory Visit (INDEPENDENT_AMBULATORY_CARE_PROVIDER_SITE_OTHER): Payer: BC Managed Care – PPO | Admitting: Physician Assistant

## 2022-01-13 ENCOUNTER — Other Ambulatory Visit: Payer: Self-pay

## 2022-01-13 VITALS — BP 140/89 | HR 73 | Temp 97.6°F | Ht 65.0 in | Wt 264.0 lb

## 2022-01-13 DIAGNOSIS — Z9189 Other specified personal risk factors, not elsewhere classified: Secondary | ICD-10-CM

## 2022-01-13 DIAGNOSIS — E1169 Type 2 diabetes mellitus with other specified complication: Secondary | ICD-10-CM | POA: Diagnosis not present

## 2022-01-13 DIAGNOSIS — Z6841 Body Mass Index (BMI) 40.0 and over, adult: Secondary | ICD-10-CM

## 2022-01-13 DIAGNOSIS — E782 Mixed hyperlipidemia: Secondary | ICD-10-CM

## 2022-01-13 DIAGNOSIS — E559 Vitamin D deficiency, unspecified: Secondary | ICD-10-CM | POA: Diagnosis not present

## 2022-01-13 DIAGNOSIS — Z7985 Long-term (current) use of injectable non-insulin antidiabetic drugs: Secondary | ICD-10-CM

## 2022-01-13 NOTE — Progress Notes (Signed)
Chief Complaint:   OBESITY Gloria [name] is here to discuss her progress with her obesity treatment plan along with follow-up of her obesity related diagnoses. Gloria [name] is on the Category 2 Plan and keeping a food journal and adhering to recommended goals of 1200-1400 calories and 80 grams protein and states she is following her eating plan approximately 75% of the time. Gloria [name] states she is walking 30 minutes 5 times per week.  Today's visit was #: 11 Starting weight: 274 lbs Starting date: 08/07/2021 Today's weight: 264 lbs Today's date: 01/13/2022 Total lbs lost to date: 10 Total lbs lost since last in-office visit: 0  Interim History: Gloria [name] is following category 2 and is not journaling. She is getting around 1100 calories daily with adequate protein. Hunger is mostly controlled.  Subjective:   1. Type 2 diabetes mellitus with other specified complication, without long-term current use of insulin ([place]) Discussed labs with patient today. Ozempic was increased to 1 mg at last visit. Gloria [name] increased it for the first time 5 days ago. Hunger has decreased.  2. Vitamin D deficiency Discussed labs with patient today. Vit D within normal range. Pt is taking OTC 2,000 IU daily and weekly Vit D.  3. Mixed hyperlipidemia Discussed labs with patient today. No meds. Pt has h/o elevated liver enzymes and believes that she hasn't been placed on a statin for that reason. Pt sees cardiology.  4. At risk for heart disease Gloria [name] is at higher than average risk for cardiovascular disease due to obesity.  Assessment/Plan:   1. Type 2 diabetes mellitus with other specified complication, without long-term current use of insulin (HCC) Good blood sugar control is important to decrease the likelihood of diabetic complications such as nephropathy, neuropathy, limb loss, blindness, coronary artery disease, and death. Intensive lifestyle modification including diet, exercise and weight loss are the  first line of treatment for diabetes. Continue with Ozempic and plan.  2. Vitamin D deficiency Low Vitamin D level contributes to fatigue and are associated with obesity, breast, and colon cancer. She agrees to discontinue OTC Vit D but continue to take prescription Vitamin D 50,000 IU every week and will follow-up for routine testing of Vitamin D, at least 2-3 times per year to avoid over-replacement.  3. Mixed hyperlipidemia Cardiovascular risk and specific lipid/LDL goals reviewed.  We discussed several lifestyle modifications today and Gloria [name] will continue to work on diet, exercise and weight loss efforts. Orders and follow up as documented in patient record. Follow up with cardiology.  Counseling Intensive lifestyle modifications are the first line treatment for this issue. Dietary changes: Increase soluble fiber. Decrease simple carbohydrates. Exercise changes: Moderate to vigorous-intensity aerobic activity 150 minutes per week if tolerated. Lipid-lowering medications: see documented in medical record.  4. At risk for heart disease Gloria [name] was given approximately 15 minutes of coronary artery disease prevention counseling today. She is 51 y.o. female and has risk factors for heart disease including obesity. We discussed intensive lifestyle modifications today with an emphasis on specific weight loss instructions and strategies.  Repetitive spaced learning was employed today to elicit superior memory formation and behavioral change.   5. Morbid Obesity with current BMI of 43.93 Gloria [name] is currently in the action stage of change. As such, her goal is to continue with weight loss efforts. She has agreed to the Category 2 Plan + 8 fl oz of milk and 100-120 snack calories.   Exercise goals:  As is  Behavioral modification strategies: meal planning and cooking  strategies and planning for success.  Gloria [name] has agreed to follow-up with our clinic in 2 weeks. She was informed of the  importance of frequent follow-up visits to maximize her success with intensive lifestyle modifications for her multiple health conditions.   Objective:   Blood pressure 140/89, pulse 73, temperature 97.6 F (36.4 C), height 5' 5"  (1.651 m), weight 264 lb (119.7 kg), last menstrual period 04/25/2013, SpO2 98 %. Body mass index is 43.93 kg/m.  General: Cooperative, alert, well developed, in no acute distress. HEENT: Conjunctivae and lids unremarkable. Cardiovascular: Regular rhythm.  Lungs: Normal work of breathing. Neurologic: No focal deficits.   Lab Results  Component Value Date   CREATININE 0.67 12/28/2021   BUN 9 12/28/2021   NA 131 (L) 12/28/2021   K 4.4 12/28/2021   CL 94 (L) 12/28/2021   CO2 25 12/28/2021   Lab Results  Component Value Date   ALT 15 12/28/2021   AST 17 12/28/2021   ALKPHOS 82 12/28/2021   BILITOT 0.4 12/28/2021   Lab Results  Component Value Date   HGBA1C 5.6 12/28/2021   HGBA1C 6.2 (H) 08/07/2021   HGBA1C 6.3 (A) 05/20/2021   HGBA1C 5.7 (H) 09/19/2020   HGBA1C 5.8 (A) 07/31/2020   Lab Results  Component Value Date   INSULIN 30.5 (H) 12/28/2021   INSULIN 29.5 (H) 08/07/2021   Lab Results  Component Value Date   TSH 2.860 08/07/2021   Lab Results  Component Value Date   CHOL 222 (H) 12/28/2021   HDL 43 12/28/2021   LDLCALC 144 (H) 12/28/2021   LDLDIRECT 145.0 07/18/2019   TRIG 192 (H) 12/28/2021   CHOLHDL 6.1 (H) 09/19/2020   Lab Results  Component Value Date   VD25OH 60.0 12/28/2021   VD25OH 45.2 08/07/2021   VD25OH 57.79 07/18/2019   Lab Results  Component Value Date   WBC 10.4 08/07/2021   HGB 13.2 08/07/2021   HCT 40.2 08/07/2021   MCV 89 08/07/2021   PLT 284 08/07/2021   Lab Results  Component Value Date   IRON 54 10/06/2017   TIBC 304 10/06/2017   FERRITIN 151 (H) 10/06/2017    Attestation Statements:   Reviewed by clinician on day of visit: allergies, medications, problem list, medical history, surgical  history, family history, social history, and previous encounter notes.  Coral Ceo, CMA, am acting as transcriptionist for [employer], PA-C.  I have reviewed the above documentation for accuracy and completeness, and I agree with the above. Abby Potash, PA-C

## 2022-01-20 ENCOUNTER — Ambulatory Visit: Payer: BC Managed Care – PPO | Admitting: Psychiatry

## 2022-02-02 ENCOUNTER — Encounter (INDEPENDENT_AMBULATORY_CARE_PROVIDER_SITE_OTHER): Payer: Self-pay | Admitting: Family Medicine

## 2022-02-02 ENCOUNTER — Ambulatory Visit (INDEPENDENT_AMBULATORY_CARE_PROVIDER_SITE_OTHER): Payer: 59 | Admitting: Family Medicine

## 2022-02-02 ENCOUNTER — Other Ambulatory Visit: Payer: Self-pay

## 2022-02-02 VITALS — BP 116/76 | HR 75 | Temp 98.0°F | Ht 65.0 in | Wt 262.0 lb

## 2022-02-02 DIAGNOSIS — E1169 Type 2 diabetes mellitus with other specified complication: Secondary | ICD-10-CM | POA: Diagnosis not present

## 2022-02-02 DIAGNOSIS — Z9189 Other specified personal risk factors, not elsewhere classified: Secondary | ICD-10-CM

## 2022-02-02 DIAGNOSIS — E782 Mixed hyperlipidemia: Secondary | ICD-10-CM | POA: Diagnosis not present

## 2022-02-02 DIAGNOSIS — Z6841 Body Mass Index (BMI) 40.0 and over, adult: Secondary | ICD-10-CM | POA: Diagnosis not present

## 2022-02-02 DIAGNOSIS — Z7985 Long-term (current) use of injectable non-insulin antidiabetic drugs: Secondary | ICD-10-CM

## 2022-02-02 MED ORDER — SEMAGLUTIDE (1 MG/DOSE) 4 MG/3ML ~~LOC~~ SOPN: 1.0000 mg | PEN_INJECTOR | SUBCUTANEOUS | 0 refills | Status: DC

## 2022-02-03 ENCOUNTER — Telehealth (INDEPENDENT_AMBULATORY_CARE_PROVIDER_SITE_OTHER): Payer: Self-pay | Admitting: Family Medicine

## 2022-02-03 ENCOUNTER — Encounter (INDEPENDENT_AMBULATORY_CARE_PROVIDER_SITE_OTHER): Payer: Self-pay

## 2022-02-03 NOTE — Telephone Encounter (Signed)
Prior authorization approved for Ozempic. Effective 02/03/22 - 02/04/23. Patient notified via mychart of approval.  ?

## 2022-02-08 NOTE — Progress Notes (Signed)
? ? ? ?Chief Complaint:  ? ?OBESITY ?Gloria [name] is here to discuss her progress with her obesity treatment plan along with follow-up of her obesity related diagnoses. Gloria [name] is on the Category 2 Plan + 8 oz of milk and 100-120 snack calories and states she is following her eating plan approximately 70% of the time. Gloria [name] states she is walking for 30 minutes 5 times per week. ? ?Today's visit was #: 12 ?Starting weight: 274 lbs ?Starting date: 08/07/2021 ?Today's weight: 262 lbs ?Today's date: 02/02/2022 ?Total lbs lost to date: 12 ?Total lbs lost since last in-office visit: 2 ? ?Interim History: Gloria [name] is doing well with weight loss. She has been snacking more since her last visit. She is limiting her portion sizes. Her hunger is controlled. She struggles some days to meet her protein goals. She is not eating late at night and she stops eating snacks when she is full. ? ?Subjective:  ? ?1. Type 2 diabetes mellitus with other specified complication, without long-term current use of insulin (Gloria [name]) ?Gloria [name] is taking Ozempic 1 mg, and she denies side effects. Last A1c looked better at 5.6. ? ?2. Mixed hyperlipidemia ?Gloria [name] is not on a statin. She has seen Cardiology.  ? ?3. At risk for impaired metabolic function ?Gloria [name] is at increased risk for impaired metabolic function due to not meeting protein goals. ? ?Assessment/Plan:  ? ?1. Type 2 diabetes mellitus with other specified complication, without long-term current use of insulin (Gloria [name]) ?We will refill Ozempic 1 mg for 1 month. Side effects were discussed. Good blood sugar control is important to decrease the likelihood of diabetic complications such as nephropathy, neuropathy, limb loss, blindness, coronary artery disease, and death. Intensive lifestyle modification including diet, exercise and weight loss are the first line of treatment for diabetes.  ? ?- Semaglutide, 1 MG/DOSE, 4 MG/3ML SOPN; Inject 1 mg as directed once a week.  Dispense: 3 mL; Refill: 0 ? ?2.  Mixed hyperlipidemia ?Cardiovascular risk and specific lipid/LDL goals reviewed. We discussed several lifestyle modifications today. Gloria [name] will continue working on dietary changes, exercise, and weight loss. Orders and follow up as documented in patient record.  ? ?3. At risk for impaired metabolic function ?Gloria [name] was given approximately 15 minutes of impaired  metabolic function prevention counseling today. We discussed intensive lifestyle modifications today with an emphasis on specific nutrition and exercise instructions and strategies.  ? ?Repetitive spaced learning was employed today to elicit superior memory formation and behavioral change. ? ?4. Morbid Obesity with current BMI of 43.7 ?Gloria [name] is currently in the action stage of change. As such, her goal is to continue with weight loss efforts. She has agreed to the Category 2 Plan.  ? ?Exercise goals: As is. ? ?Behavioral modification strategies: increasing lean protein intake, increasing water intake, no skipping meals, and meal planning and cooking strategies. ? ?Gloria [name] has agreed to follow-up with our clinic in 2 weeks. She was informed of the importance of frequent follow-up visits to maximize her success with intensive lifestyle modifications for her multiple health conditions.  ? ?Objective:  ? ?Blood pressure 116/76, pulse 75, temperature 98 ?F (36.7 ?C), height 5' 5"  (1.651 m), weight 262 lb (118.8 kg), last menstrual period 04/25/2013, SpO2 98 %. ?Body mass index is 43.6 kg/m?. ? ?General: Cooperative, alert, well developed, in no acute distress. ?HEENT: Conjunctivae and lids unremarkable. ?Cardiovascular: Regular rhythm.  ?Lungs: Normal work of breathing. ?Neurologic: No focal deficits.  ? ?Lab Results  ?Component Value Date  ? CREATININE 0.67  12/28/2021  ? BUN 9 12/28/2021  ? NA 131 (L) 12/28/2021  ? K 4.4 12/28/2021  ? CL 94 (L) 12/28/2021  ? CO2 25 12/28/2021  ? ?Lab Results  ?Component Value Date  ? ALT 15 12/28/2021  ? AST 17 12/28/2021   ? ALKPHOS 82 12/28/2021  ? BILITOT 0.4 12/28/2021  ? ?Lab Results  ?Component Value Date  ? HGBA1C 5.6 12/28/2021  ? HGBA1C 6.2 (H) 08/07/2021  ? HGBA1C 6.3 (A) 05/20/2021  ? HGBA1C 5.7 (H) 09/19/2020  ? HGBA1C 5.8 (A) 07/31/2020  ? ?Lab Results  ?Component Value Date  ? INSULIN 30.5 (H) 12/28/2021  ? INSULIN 29.5 (H) 08/07/2021  ? ?Lab Results  ?Component Value Date  ? TSH 2.860 08/07/2021  ? ?Lab Results  ?Component Value Date  ? CHOL 222 (H) 12/28/2021  ? HDL 43 12/28/2021  ? LDLCALC 144 (H) 12/28/2021  ? LDLDIRECT 145.0 07/18/2019  ? TRIG 192 (H) 12/28/2021  ? CHOLHDL 6.1 (H) 09/19/2020  ? ?Lab Results  ?Component Value Date  ? VD25OH 60.0 12/28/2021  ? VD25OH 45.2 08/07/2021  ? VD25OH 57.79 07/18/2019  ? ?Lab Results  ?Component Value Date  ? WBC 10.4 08/07/2021  ? HGB 13.2 08/07/2021  ? HCT 40.2 08/07/2021  ? MCV 89 08/07/2021  ? PLT 284 08/07/2021  ? ?Lab Results  ?Component Value Date  ? IRON 54 10/06/2017  ? TIBC 304 10/06/2017  ? FERRITIN 151 (H) 10/06/2017  ? ?Attestation Statements:  ? ?Reviewed by clinician on day of visit: allergies, medications, problem list, medical history, surgical history, family history, social history, and previous encounter notes. ? ? ?I, Trixie Dredge, am acting as transcriptionist for Dennard Nip, MD. ? ?I have reviewed the above documentation for accuracy and completeness, and I agree with the above. -  Dennard Nip, MD ? ? ?

## 2022-02-15 ENCOUNTER — Other Ambulatory Visit: Payer: Self-pay | Admitting: Family Medicine

## 2022-02-15 DIAGNOSIS — Z8669 Personal history of other diseases of the nervous system and sense organs: Secondary | ICD-10-CM

## 2022-02-16 ENCOUNTER — Ambulatory Visit (INDEPENDENT_AMBULATORY_CARE_PROVIDER_SITE_OTHER): Payer: 59 | Admitting: Physician Assistant

## 2022-02-16 ENCOUNTER — Other Ambulatory Visit: Payer: Self-pay

## 2022-02-16 ENCOUNTER — Encounter (INDEPENDENT_AMBULATORY_CARE_PROVIDER_SITE_OTHER): Payer: Self-pay | Admitting: Physician Assistant

## 2022-02-16 VITALS — BP 143/84 | HR 68 | Temp 98.1°F | Ht 65.0 in | Wt 258.0 lb

## 2022-02-16 DIAGNOSIS — E669 Obesity, unspecified: Secondary | ICD-10-CM | POA: Diagnosis not present

## 2022-02-16 DIAGNOSIS — Z6841 Body Mass Index (BMI) 40.0 and over, adult: Secondary | ICD-10-CM | POA: Diagnosis not present

## 2022-02-16 DIAGNOSIS — E559 Vitamin D deficiency, unspecified: Secondary | ICD-10-CM

## 2022-02-16 NOTE — Telephone Encounter (Signed)
Pt states she continues to take these as needed; has not needed as much. Took last on yesterday. ?

## 2022-02-16 NOTE — Progress Notes (Signed)
? ? ? ?Chief Complaint:  ? ?OBESITY ?Gloria [name] is here to discuss her progress with her obesity treatment plan along with follow-up of her obesity related diagnoses. [name] is on the Category 2 Plan and states she is following her eating plan approximately 50% of the time. [name] states she is doing 0 minutes 0 times per week. ? ?Today's visit was #: 13 ?Starting weight: 274 lbs ?Starting date: 08/07/2021 ?Today's weight: 258 lbs ?Today's date: 02/16/2022 ?Total lbs lost to date: 16 lbs ?Total lbs lost since last in-office visit: 4 lbs ? ?Interim History: Gloria [name] reports that breakfast and lunch have been good but dinner has been more of a challenge and she has been eating out more often. She has not been meal planning dinner due to helping her mom and she has been busier at work.  ? ?Subjective:  ? ?1. Vitamin D deficiency ?Dilyn is currently on Vitamin 50,000 weekly and she is taking as prescribed.  ? ?Assessment/Plan:  ? ?1. Vitamin D deficiency ?Low Vitamin D level contributes to fatigue and are associated with obesity, breast, and colon cancer. Mersades agrees to continue to take prescription Vitamin D 50,000 IU every week and she will follow-up for routine testing of Vitamin D, at least 2-3 times per year to avoid over-replacement. ? ?2. Obesity with current BMI of 42.9 ?Naylee is currently in the action stage of change. As such, her goal is to continue with weight loss efforts. She has agreed to the Category 2 Plan.  ? ?Exercise goals: No exercise has been prescribed at this time. ? ?Behavioral modification strategies: decreasing eating out. ? ?Gloria [name] has agreed to follow-up with our clinic in 3 weeks. She was informed of the importance of frequent follow-up visits to maximize her success with intensive lifestyle modifications for her multiple health conditions.  ? ?Objective:  ? ?Blood pressure (!) 143/84, pulse 68, temperature 98.1 ?F (36.7 ?C), height 5' 5"  (1.651 m), weight 258 lb (117 kg), last  menstrual period 04/25/2013, SpO2 98 %. ?Body mass index is 42.93 kg/m?. ? ?General: Cooperative, alert, well developed, in no acute distress. ?HEENT: Conjunctivae and lids unremarkable. ?Cardiovascular: Regular rhythm.  ?Lungs: Normal work of breathing. ?Neurologic: No focal deficits.  ? ?Lab Results  ?Component Value Date  ? CREATININE 0.67 12/28/2021  ? BUN 9 12/28/2021  ? NA 131 (L) 12/28/2021  ? K 4.4 12/28/2021  ? CL 94 (L) 12/28/2021  ? CO2 25 12/28/2021  ? ?Lab Results  ?Component Value Date  ? ALT 15 12/28/2021  ? AST 17 12/28/2021  ? ALKPHOS 82 12/28/2021  ? BILITOT 0.4 12/28/2021  ? ?Lab Results  ?Component Value Date  ? HGBA1C 5.6 12/28/2021  ? HGBA1C 6.2 (H) 08/07/2021  ? HGBA1C 6.3 (A) 05/20/2021  ? HGBA1C 5.7 (H) 09/19/2020  ? HGBA1C 5.8 (A) 07/31/2020  ? ?Lab Results  ?Component Value Date  ? INSULIN 30.5 (H) 12/28/2021  ? INSULIN 29.5 (H) 08/07/2021  ? ?Lab Results  ?Component Value Date  ? TSH 2.860 08/07/2021  ? ?Lab Results  ?Component Value Date  ? CHOL 222 (H) 12/28/2021  ? HDL 43 12/28/2021  ? LDLCALC 144 (H) 12/28/2021  ? LDLDIRECT 145.0 07/18/2019  ? TRIG 192 (H) 12/28/2021  ? CHOLHDL 6.1 (H) 09/19/2020  ? ?Lab Results  ?Component Value Date  ? VD25OH 60.0 12/28/2021  ? VD25OH 45.2 08/07/2021  ? VD25OH 57.79 07/18/2019  ? ?Lab Results  ?Component Value Date  ? WBC 10.4 08/07/2021  ? HGB 13.2 08/07/2021  ?  HCT 40.2 08/07/2021  ? MCV 89 08/07/2021  ? PLT 284 08/07/2021  ? ?Lab Results  ?Component Value Date  ? IRON 54 10/06/2017  ? TIBC 304 10/06/2017  ? FERRITIN 151 (H) 10/06/2017  ? ?Attestation Statements:  ? ?Reviewed by clinician on day of visit: allergies, medications, problem list, medical history, surgical history, family history, social history, and previous encounter notes. ? ?Time spent on visit including pre-visit chart review and post-visit care and charting was 40 minutes.  ? ?I, Tonye Pearson, am acting as Location manager for [employer], PA-C. ? ?I have reviewed the above  documentation for accuracy and completeness, and I agree with the above. Abby Potash, PA-C ? ?

## 2022-02-25 ENCOUNTER — Encounter (INDEPENDENT_AMBULATORY_CARE_PROVIDER_SITE_OTHER): Payer: Self-pay | Admitting: Family Medicine

## 2022-02-25 DIAGNOSIS — E1169 Type 2 diabetes mellitus with other specified complication: Secondary | ICD-10-CM

## 2022-02-25 NOTE — Telephone Encounter (Signed)
Pt last seen by Tracey Aguilar, PA-C.  

## 2022-03-01 ENCOUNTER — Other Ambulatory Visit (INDEPENDENT_AMBULATORY_CARE_PROVIDER_SITE_OTHER): Payer: Self-pay | Admitting: Family Medicine

## 2022-03-01 ENCOUNTER — Ambulatory Visit: Payer: BC Managed Care – PPO | Admitting: Psychiatry

## 2022-03-01 DIAGNOSIS — E1169 Type 2 diabetes mellitus with other specified complication: Secondary | ICD-10-CM

## 2022-03-01 NOTE — Telephone Encounter (Signed)
Please see message and advise.  Thank you. ° °

## 2022-03-01 NOTE — Telephone Encounter (Signed)
LAST APPOINTMENT DATE: 02/16/22 ?NEXT APPOINTMENT DATE: 03/11/22 ? ? ?[id number] [address] ?[address] ?[place] [address] ?Phone: [phone] Fax: [phone] ? ?[facility] #[id number] - [place] ?[address] ?[place] [address] ?Phone: [phone] Fax: [phone] ? ?Patient is requesting a refill of the following medications: ?Requested Prescriptions  ? ?Pending Prescriptions Disp Refills  ? Semaglutide, 1 MG/DOSE, 4 MG/3ML SOPN 3 mL 0  ?  Sig: Inject 1 mg as directed once a week.  ? ? ?Date last filled: 02/02/22 ?Previously prescribed by Dr Leafy Ro ? ?Lab Results  ?Component Value Date  ? HGBA1C 5.6 12/28/2021  ? HGBA1C 6.2 (H) 08/07/2021  ? HGBA1C 6.3 (A) 05/20/2021  ? ?Lab Results  ?Component Value Date  ? MICROALBUR <0.7 03/13/2020  ? LDLCALC 144 (H) 12/28/2021  ? CREATININE 0.67 12/28/2021  ? ?Lab Results  ?Component Value Date  ? VD25OH 60.0 12/28/2021  ? VD25OH 45.2 08/07/2021  ? VD25OH 57.79 07/18/2019  ? ? ?BP Readings from Last 3 Encounters:  ?02/16/22 (!) 143/84  ?02/02/22 116/76  ?01/13/22 140/89  ? ? ?

## 2022-03-02 MED ORDER — SEMAGLUTIDE (1 MG/DOSE) 4 MG/3ML ~~LOC~~ SOPN: 1.0000 mg | PEN_INJECTOR | SUBCUTANEOUS | 0 refills | Status: DC

## 2022-03-02 NOTE — Telephone Encounter (Signed)
You can fill it this time but please call her and let her know of our new refill policy and we will no longer be able to fill between visits. Thanks

## 2022-03-11 ENCOUNTER — Ambulatory Visit (INDEPENDENT_AMBULATORY_CARE_PROVIDER_SITE_OTHER): Payer: 59 | Admitting: Family Medicine

## 2022-03-11 ENCOUNTER — Encounter (INDEPENDENT_AMBULATORY_CARE_PROVIDER_SITE_OTHER): Payer: Self-pay | Admitting: Family Medicine

## 2022-03-11 VITALS — BP 131/76 | HR 71 | Temp 98.2°F | Ht 65.0 in

## 2022-03-11 DIAGNOSIS — E559 Vitamin D deficiency, unspecified: Secondary | ICD-10-CM

## 2022-03-11 DIAGNOSIS — Z7985 Long-term (current) use of injectable non-insulin antidiabetic drugs: Secondary | ICD-10-CM

## 2022-03-11 DIAGNOSIS — Z6841 Body Mass Index (BMI) 40.0 and over, adult: Secondary | ICD-10-CM

## 2022-03-11 DIAGNOSIS — E669 Obesity, unspecified: Secondary | ICD-10-CM

## 2022-03-11 DIAGNOSIS — E1169 Type 2 diabetes mellitus with other specified complication: Secondary | ICD-10-CM | POA: Diagnosis not present

## 2022-03-11 DIAGNOSIS — Z9189 Other specified personal risk factors, not elsewhere classified: Secondary | ICD-10-CM

## 2022-03-11 MED ORDER — SEMAGLUTIDE (1 MG/DOSE) 4 MG/3ML ~~LOC~~ SOPN: 1.0000 mg | PEN_INJECTOR | SUBCUTANEOUS | 0 refills | Status: DC

## 2022-03-24 NOTE — Progress Notes (Signed)
? ? ? ?Chief Complaint:  ? ?OBESITY ?Gloria [name] is here to discuss her progress with her obesity treatment plan along with follow-up of her obesity related diagnoses. Gloria [name] is on the Category 2 Plan and states she is following her eating plan approximately 70% of the time. Gloria [name] states she is doing 0 minutes 0 times per week. ? ?Today's visit was #: 14 ?Starting weight: 274 lbs ?Starting date: 08/07/2021 ?Today's weight: 258 lbs ?Today's date: 03/11/2022 ?Total lbs lost to date: 34 ?Total lbs lost since last in-office visit: 0 ? ?Interim History: Gloria [name] has been on vacation most of the time since her last visit. She worked on increasing her protein and vegetables, and decreasing simple carbohydrates. She had extra temptations but she did well with minimizing indulgences. She is ready to get back on track. ? ?Subjective:  ? ?1. Type 2 diabetes mellitus with other specified complication, without long-term current use of insulin ([place]) ?Gloria [name] is working on diet and exercise. She is doing well on Ozempic. ? ?2. Vitamin D deficiency ?Gloria [name] is doing well on Vitamin D prescription, with no side effects noted.  ? ?3. At risk for heart disease ?Gloria [name] is at higher than average risk for cardiovascular disease due to obesity. ? ?Assessment/Plan:  ? ?1. Type 2 diabetes mellitus with other specified complication, without long-term current use of insulin ([place]) ?Gloria [name] will continue Ozempic 1 mg q week, and we will refill for 1 month. ? ?- Semaglutide, 1 MG/DOSE, 4 MG/3ML SOPN; Inject 1 mg as directed once a week.  Dispense: 3 mL; Refill: 0 ? ?2. Vitamin D deficiency ?Gloria [name] will continue her Vitamin D prescription, and we will recheck labs in 1-2 months. ? ?3. At risk for heart disease ?Gloria [name] was given approximately 15 minutes of coronary artery disease prevention counseling today. She is 51 y.o. female and has risk factors for heart disease including obesity. We discussed intensive lifestyle modifications today with an  emphasis on specific weight loss instructions and strategies. ? ?Repetitive spaced learning was employed today to elicit superior memory formation and behavioral change.  ? ?4. Obesity with current BMI of 42.9 ?Gloria [name] is currently in the action stage of change. As such, her goal is to continue with weight loss efforts. She has agreed to the Category 2 Plan.  ? ?Exercise goals: Start pilates class.  ? ?Behavioral modification strategies: increasing lean protein intake and planning for success. ? ?Gloria [name] has agreed to follow-up with our clinic in 4 weeks. She was informed of the importance of frequent follow-up visits to maximize her success with intensive lifestyle modifications for her multiple health conditions.  ? ?Objective:  ? ?Blood pressure 131/76, pulse 71, temperature 98.2 ?F (36.8 ?C), height 5' 5"  (1.651 m), last menstrual period 04/25/2013, SpO2 97 %. ?Body mass index is 42.93 kg/m?. ? ?General: Cooperative, alert, well developed, in no acute distress. ?HEENT: Conjunctivae and lids unremarkable. ?Cardiovascular: Regular rhythm.  ?Lungs: Normal work of breathing. ?Neurologic: No focal deficits.  ? ?Lab Results  ?Component Value Date  ? CREATININE 0.67 12/28/2021  ? BUN 9 12/28/2021  ? NA 131 (L) 12/28/2021  ? K 4.4 12/28/2021  ? CL 94 (L) 12/28/2021  ? CO2 25 12/28/2021  ? ?Lab Results  ?Component Value Date  ? ALT 15 12/28/2021  ? AST 17 12/28/2021  ? ALKPHOS 82 12/28/2021  ? BILITOT 0.4 12/28/2021  ? ?Lab Results  ?Component Value Date  ? HGBA1C 5.6 12/28/2021  ? HGBA1C 6.2 (H) 08/07/2021  ? HGBA1C 6.3 (A)  05/20/2021  ? HGBA1C 5.7 (H) 09/19/2020  ? HGBA1C 5.8 (A) 07/31/2020  ? ?Lab Results  ?Component Value Date  ? INSULIN 30.5 (H) 12/28/2021  ? INSULIN 29.5 (H) 08/07/2021  ? ?Lab Results  ?Component Value Date  ? TSH 2.860 08/07/2021  ? ?Lab Results  ?Component Value Date  ? CHOL 222 (H) 12/28/2021  ? HDL 43 12/28/2021  ? LDLCALC 144 (H) 12/28/2021  ? LDLDIRECT 145.0 07/18/2019  ? TRIG 192 (H)  12/28/2021  ? CHOLHDL 6.1 (H) 09/19/2020  ? ?Lab Results  ?Component Value Date  ? VD25OH 60.0 12/28/2021  ? VD25OH 45.2 08/07/2021  ? VD25OH 57.79 07/18/2019  ? ?Lab Results  ?Component Value Date  ? WBC 10.4 08/07/2021  ? HGB 13.2 08/07/2021  ? HCT 40.2 08/07/2021  ? MCV 89 08/07/2021  ? PLT 284 08/07/2021  ? ?Lab Results  ?Component Value Date  ? IRON 54 10/06/2017  ? TIBC 304 10/06/2017  ? FERRITIN 151 (H) 10/06/2017  ? ?Attestation Statements:  ? ?Reviewed by clinician on day of visit: allergies, medications, problem list, medical history, surgical history, family history, social history, and previous encounter notes. ? ? ?I, Trixie Dredge, am acting as transcriptionist for Dennard Nip, MD. ? ?I have reviewed the above documentation for accuracy and completeness, and I agree with the above. -  Dennard Nip, MD ? ? ? ?

## 2022-03-31 ENCOUNTER — Ambulatory Visit (INDEPENDENT_AMBULATORY_CARE_PROVIDER_SITE_OTHER): Payer: 59 | Admitting: Physician Assistant

## 2022-04-07 ENCOUNTER — Encounter (INDEPENDENT_AMBULATORY_CARE_PROVIDER_SITE_OTHER): Payer: Self-pay | Admitting: Family Medicine

## 2022-04-07 ENCOUNTER — Ambulatory Visit (INDEPENDENT_AMBULATORY_CARE_PROVIDER_SITE_OTHER): Payer: 59 | Admitting: Family Medicine

## 2022-04-07 VITALS — BP 136/83 | HR 66 | Temp 97.9°F | Ht 65.0 in | Wt 257.0 lb

## 2022-04-07 DIAGNOSIS — Z6841 Body Mass Index (BMI) 40.0 and over, adult: Secondary | ICD-10-CM

## 2022-04-07 DIAGNOSIS — E1169 Type 2 diabetes mellitus with other specified complication: Secondary | ICD-10-CM

## 2022-04-07 DIAGNOSIS — E669 Obesity, unspecified: Secondary | ICD-10-CM

## 2022-04-07 DIAGNOSIS — Z9189 Other specified personal risk factors, not elsewhere classified: Secondary | ICD-10-CM

## 2022-04-07 DIAGNOSIS — E559 Vitamin D deficiency, unspecified: Secondary | ICD-10-CM

## 2022-04-07 DIAGNOSIS — Z7985 Long-term (current) use of injectable non-insulin antidiabetic drugs: Secondary | ICD-10-CM

## 2022-04-07 IMAGING — MR MR ABDOMEN WO/W CM
15 of 16 series · 46 of 48 positions shown · IV contrast (10 GADAVIST)
Comparison: Multiple exams, including abdominal ultrasound
07/30/2020 and prior MRI from 03/27/2013

CLINICAL DATA: Non alcoholic steatohepatitis.

EXAM:
MRI ABDOMEN WITHOUT AND WITH CONTRAST
TECHNIQUE: Multiplanar multisequence MR imaging of the abdomen was performed
both before and after the administration of intravenous contrast.
CONTRAST:  10mL GADAVIST GADOBUTROL 1 MMOL/ML IV SOLN

[Series 3: T2 · coronal · 6.0mm · 1.56mm/px · 1 of 36 slices shown (1 of 2)]
[im 1/36]
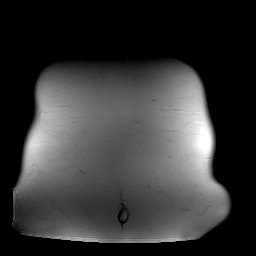

[Series 4: T2 fat-sat · 1 of 4 slices shown (1 of 2)]
[im 1/4]
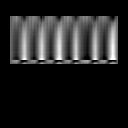

[Series 5: T2 fat-sat · axial · 6.0mm · 1.25mm/px · 1 of 36 slices shown (2 of 2)]
[im 1/36]
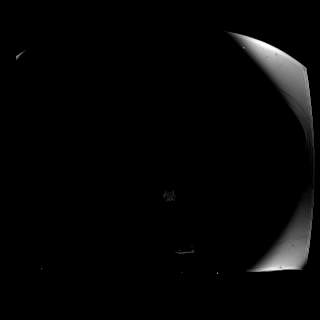

[Series 6: T1 · axial · 3.0mm · 1.25mm/px · z∈[-89,+148]mm · 4 of 80 slices shown (1 of 2)]
[im 1/80]
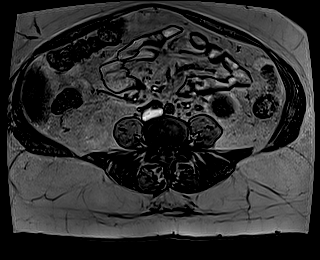
[im 27/80]
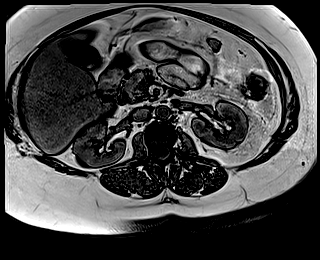
[im 53/80]
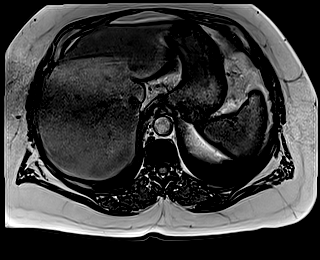
[im 80/80]
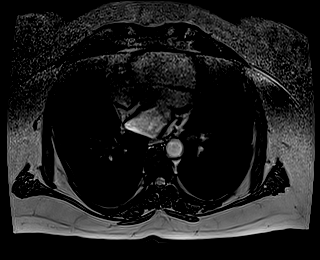

[Series 7: T1 · axial · 3.0mm · 1.25mm/px · z∈[-89,+148]mm · 4 of 80 slices shown (2 of 2)]
[im 1/80]
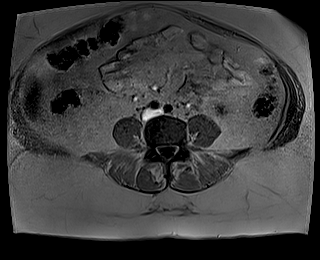
[im 27/80]
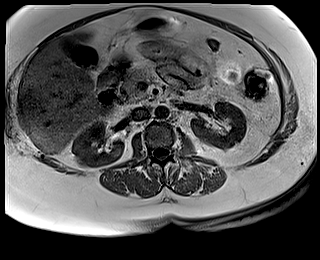
[im 53/80]
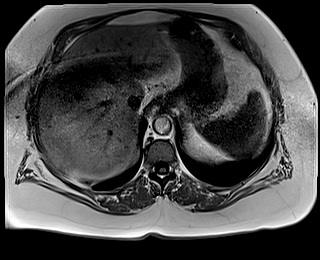
[im 80/80]
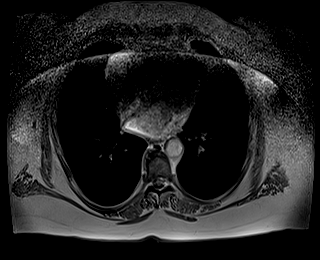

[Series 8: DWI · axial · 6.0mm · 1.49mm/px · z∈[-103,+149]mm · 4 of 72 slices shown (1 of 2)]
[im 1/72]
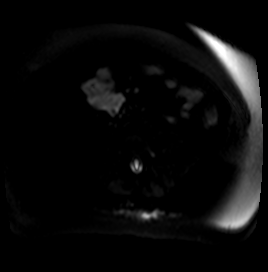
[im 24/72]
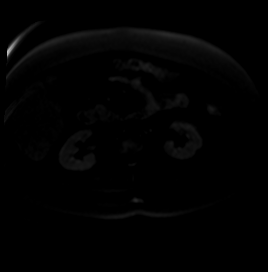
[im 48/72]
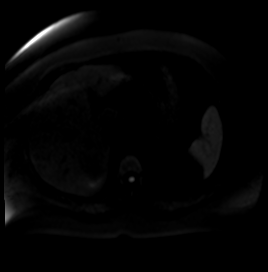
[im 72/72]
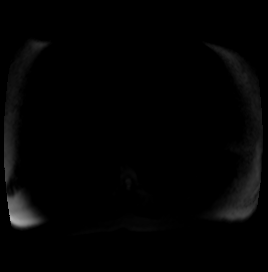

[Series 9: DWI · axial · 6.0mm · 1.49mm/px · z∈[-103,+149]mm · 2 of 36 slices shown (2 of 2)]
[im 1/36]
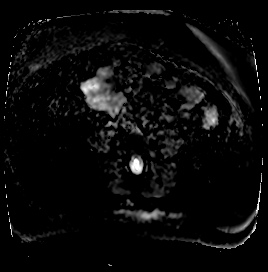
[im 36/36]
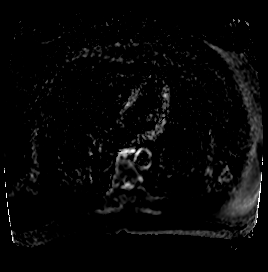

[Series 10: bSSFP · axial · 4.0mm · 0.84mm/px · z∈[-87,+149]mm · 3 of 60 slices shown]
[im 1/60]
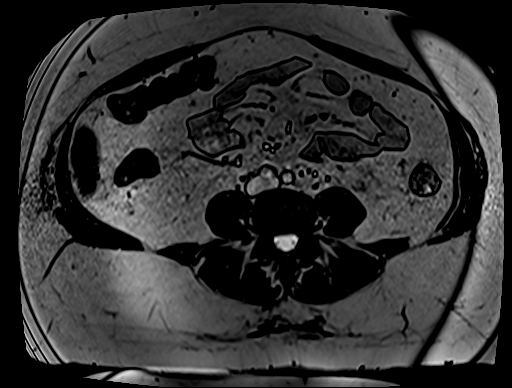
[im 30/60]
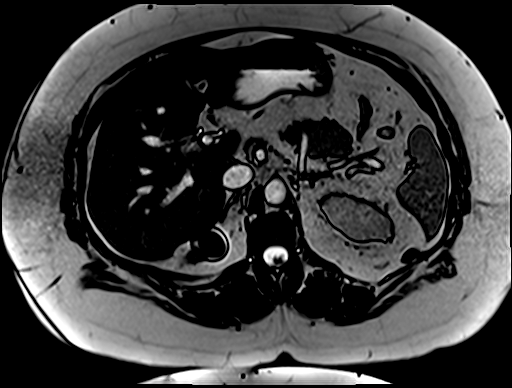
[im 60/60]
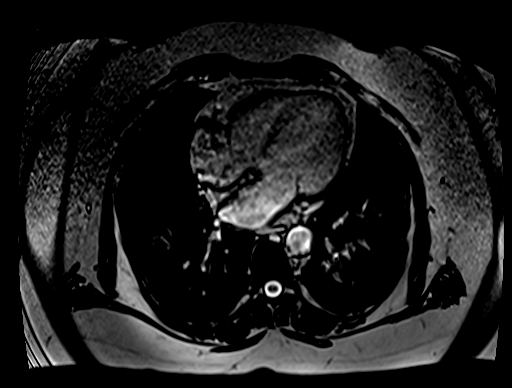

[Series 12: T1 dynamic · axial · 3.0mm · 1.25mm/px · z∈[-88,+149]mm · 4 of 80 slices shown (1 of 6)]
[im 1/80]
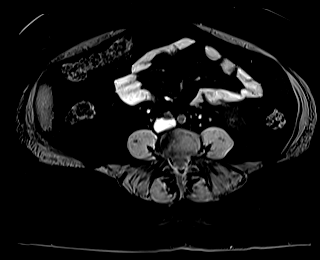
[im 27/80]
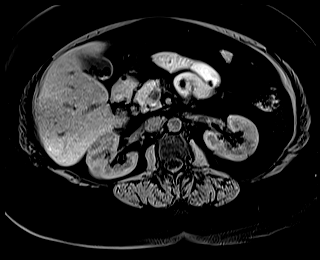
[im 53/80]
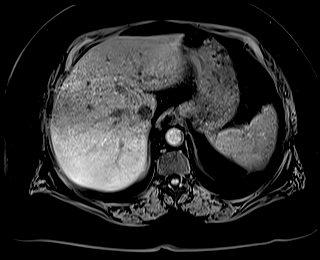
[im 80/80]
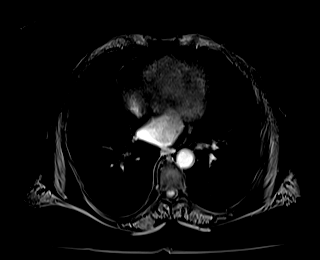

[Series 15: T1 dynamic · axial · 3.0mm · 1.25mm/px · z∈[-88,+149]mm · 4 of 80 slices shown (2 of 6)]
[im 1/80]
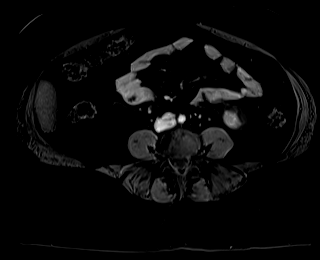
[im 27/80]
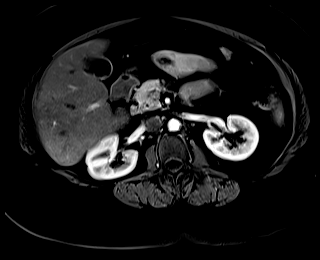
[im 53/80]
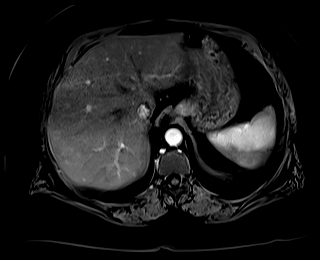
[im 80/80]
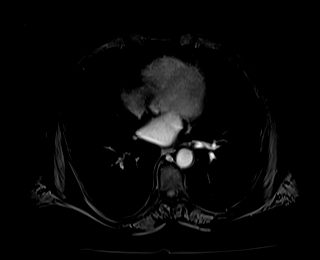

[Series 17: T1 dynamic · axial · 3.0mm · 1.25mm/px · z∈[-88,+149]mm · 4 of 80 slices shown (3 of 6)]
[im 1/80]
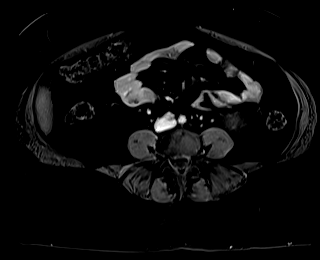
[im 27/80]
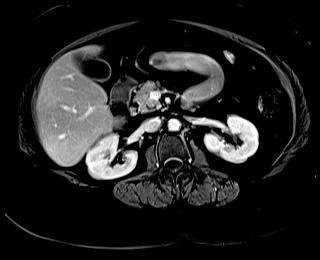
[im 53/80]
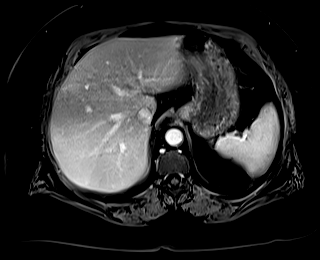
[im 80/80]
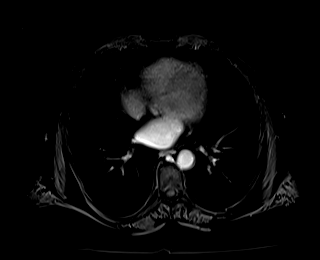

[Series 19: T1 dynamic · axial · 3.0mm · 1.25mm/px · z∈[-88,+149]mm · 4 of 80 slices shown (4 of 6)]
[im 1/80]
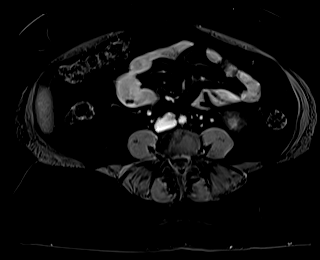
[im 27/80]
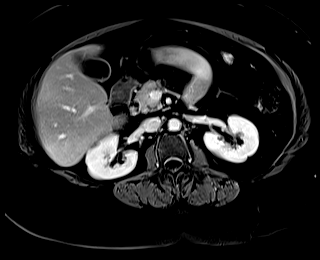
[im 53/80]
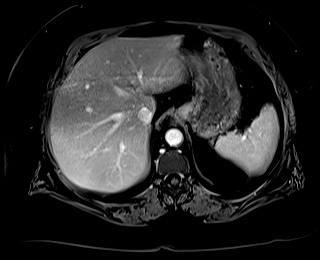
[im 80/80]
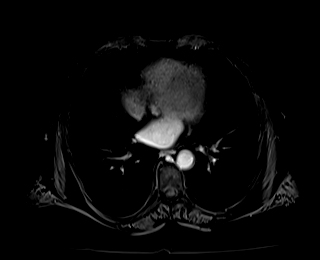

[Series 21: T1 dynamic · coronal · 3.0mm · 1.41mm/px · 4 of 72 slices shown (5 of 6)]
[im 1/72]
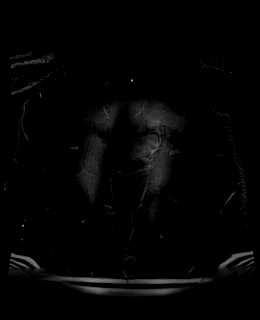
[im 24/72]
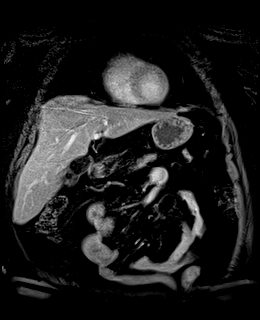
[im 48/72]
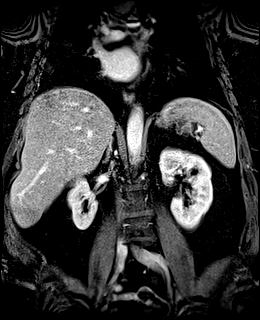
[im 72/72]
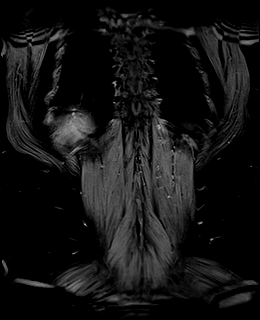

[Series 22: T2 · axial · 6.0mm · 1.56mm/px · z∈[-73,+135]mm · 2 of 30 slices shown (2 of 2)]
[im 1/30]
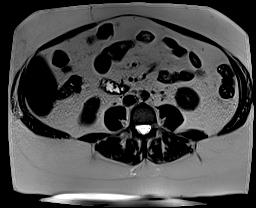
[im 30/30]
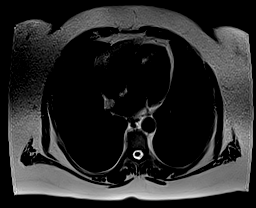

[Series 24: T1 dynamic · axial · 3.0mm · 1.25mm/px · z∈[-88,+149]mm · 4 of 80 slices shown (6 of 6)]
[im 1/80]
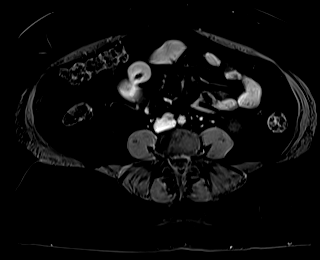
[im 27/80]
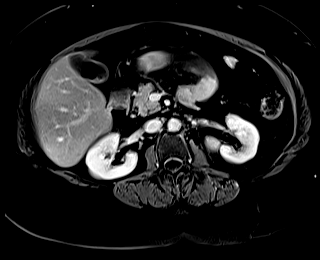
[im 53/80]
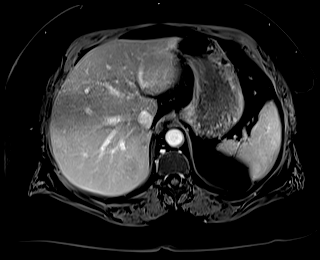
[im 80/80]
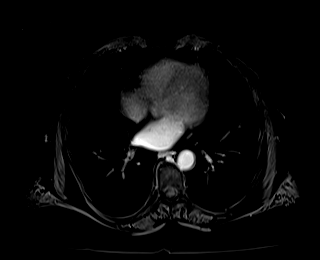

[46 of 48 positions shown; findings below may reference images not displayed]

FINDINGS: Despite efforts by the technologist and patient, motion artifact is
present on today's exam and could not be eliminated. This reduces
exam sensitivity and specificity. Body habitus reduces diagnostic
sensitivity and specificity.

Lower chest: Unremarkable

Hepatobiliary: Mild hepatic steatosis. No specific morphologic
indicators of cirrhosis. Multiple gallstones mostly measuring around
1 cm in diameter. No biliary dilatation or choledocholithiasis
identified. The liver measures 19.9 cm craniocaudad. No significant
focal liver lesions are identified.

Pancreas:  Unremarkable

Spleen:  Unremarkable

Adrenals/Urinary Tract: Both adrenal glands appear normal. Stable
postoperative findings along the right kidney upper pole with the
region partially obscured by metal artifact from clips. The kidneys
appear otherwise unremarkable.

Stomach/Bowel: Unremarkable

Vascular/Lymphatic:  Unremarkable

Other:  No supplemental non-categorized findings.

Musculoskeletal: Small left posterior disc protrusion L2-3. Reduced
intervertebral disc height at L3-4 and L4-5.
IMPRESSION: 1. Borderline hepatomegaly with mild hepatic steatosis but no focal
parenchymal liver lesion.
2. Cholelithiasis. No choledocholithiasis identified. No biliary
dilatation.
3. Degenerative disc disease in the lumbar spine.
4. Stable postoperative findings along the right kidney upper pole,
partially obscured by metal artifact from clip.

## 2022-04-13 MED ORDER — SEMAGLUTIDE (1 MG/DOSE) 4 MG/3ML ~~LOC~~ SOPN: 1.0000 mg | PEN_INJECTOR | SUBCUTANEOUS | 0 refills | Status: DC

## 2022-04-21 NOTE — Progress Notes (Signed)
Chief Complaint:   OBESITY Gloria [name] is here to discuss her progress with her obesity treatment plan along with follow-up of her obesity related diagnoses. Gloria [name] is on the Category 2 Plan and states she is following her eating plan approximately 50% of the time. Gloria [name] states she is walk for 15 minutes 3 times per week.  Today's visit was #: 15 Starting weight: 274 lbs Starting date: 08/07/2021 Today's weight: 257 lbs Today's date: 04/07/2022 Total lbs lost to date: 17 Total lbs lost since last in-office visit: 1  Interim History: Gloria [name] has done more traveling and eating out, but she still managed to lose weight. She is working on increasing her protein and portion control, and she is getting back on track.   Subjective:   1. Type 2 diabetes mellitus with other specified complication, without long-term current use of insulin (HCC) Gloria [name] is not checking her blood sugars at home. She is working on her diet and weight loss. She may have some hypoglycemia.  2. Vitamin D deficiency Gloria [name] is on Vitamin D, but her level is not yet at goal. She denies nausea, vomiting, or muscle weakness.   3. At risk for hypoglycemia Gloria [name] is at increased risk for hypoglycemia due to changes in diet, diagnosis of diabetes, and/or insulin use.    Assessment/Plan:   1. Type 2 diabetes mellitus with other specified complication, without long-term current use of insulin (HCC) Gloria [name] will continue with her diet and exercise, and we will refill Ozempic for 1 month.  - Semaglutide, 1 MG/DOSE, 4 MG/3ML SOPN; Inject 1 mg as directed once a week.  Dispense: 3 mL; Refill: 0  2. Vitamin D deficiency Low Vitamin D level contributes to fatigue and are associated with obesity, breast, and colon cancer. Gloria [name] will continue prescription Vitamin D 50,000 IU every week and will follow-up for routine testing of Vitamin D, at least 2-3 times per year to avoid over-replacement.   3. At risk for  hypoglycemia Gloria [name] was given approximately 15 minutes of counseling today regarding prevention of hypoglycemia. She was advised of symptoms of hypoglycemia. Gloria [name] was instructed to avoid skipping meals, eat regular protein rich meals, and schedule low calorie snacks as needed.  Repetitive spaced learning was employed today to elicit superior memory formation and behavioral change.  4. Obesity, Current BMI 42.9 Gloria [name] is currently in the action stage of change. As such, her goal is to continue with weight loss efforts. She has agreed to the Category 2 Plan.   Exercise goals: As is.  Behavioral modification strategies: increasing lean protein intake.  Gloria [name] has agreed to follow-up with our clinic in 4 weeks. She was informed of the importance of frequent follow-up visits to maximize her success with intensive lifestyle modifications for her multiple health conditions.   Objective:   Blood pressure 136/83, pulse 66, temperature 97.9 F (36.6 C), height 5' 5"  (1.651 m), weight 257 lb (116.6 kg), last menstrual period 04/25/2013, SpO2 98 %. Body mass index is 42.77 kg/m.  General: Cooperative, alert, well developed, in no acute distress. HEENT: Conjunctivae and lids unremarkable. Cardiovascular: Regular rhythm.  Lungs: Normal work of breathing. Neurologic: No focal deficits.   Lab Results  Component Value Date   CREATININE 0.67 12/28/2021   BUN 9 12/28/2021   NA 131 (L) 12/28/2021   K 4.4 12/28/2021   CL 94 (L) 12/28/2021   CO2 25 12/28/2021   Lab Results  Component Value Date   ALT 15 12/28/2021   AST 17  12/28/2021   ALKPHOS 82 12/28/2021   BILITOT 0.4 12/28/2021   Lab Results  Component Value Date   HGBA1C 5.6 12/28/2021   HGBA1C 6.2 (H) 08/07/2021   HGBA1C 6.3 (A) 05/20/2021   HGBA1C 5.7 (H) 09/19/2020   HGBA1C 5.8 (A) 07/31/2020   Lab Results  Component Value Date   INSULIN 30.5 (H) 12/28/2021   INSULIN 29.5 (H) 08/07/2021   Lab Results  Component Value  Date   TSH 2.860 08/07/2021   Lab Results  Component Value Date   CHOL 222 (H) 12/28/2021   HDL 43 12/28/2021   LDLCALC 144 (H) 12/28/2021   LDLDIRECT 145.0 07/18/2019   TRIG 192 (H) 12/28/2021   CHOLHDL 6.1 (H) 09/19/2020   Lab Results  Component Value Date   VD25OH 60.0 12/28/2021   VD25OH 45.2 08/07/2021   VD25OH 57.79 07/18/2019   Lab Results  Component Value Date   WBC 10.4 08/07/2021   HGB 13.2 08/07/2021   HCT 40.2 08/07/2021   MCV 89 08/07/2021   PLT 284 08/07/2021   Lab Results  Component Value Date   IRON 54 10/06/2017   TIBC 304 10/06/2017   FERRITIN 151 (H) 10/06/2017   Attestation Statements:   Reviewed by clinician on day of visit: allergies, medications, problem list, medical history, surgical history, family history, social history, and previous encounter notes.   I, Trixie Dredge, am acting as transcriptionist for Dennard Nip, MD.  I have reviewed the above documentation for accuracy and completeness, and I agree with the above. -  Dennard Nip, MD

## 2022-05-03 ENCOUNTER — Ambulatory Visit (INDEPENDENT_AMBULATORY_CARE_PROVIDER_SITE_OTHER): Payer: 59 | Admitting: Family Medicine

## 2022-05-03 ENCOUNTER — Encounter (INDEPENDENT_AMBULATORY_CARE_PROVIDER_SITE_OTHER): Payer: Self-pay | Admitting: Family Medicine

## 2022-05-03 VITALS — BP 145/81 | HR 88 | Temp 98.2°F | Ht 65.0 in | Wt 254.0 lb

## 2022-05-03 DIAGNOSIS — Z6841 Body Mass Index (BMI) 40.0 and over, adult: Secondary | ICD-10-CM

## 2022-05-03 DIAGNOSIS — I10 Essential (primary) hypertension: Secondary | ICD-10-CM | POA: Diagnosis not present

## 2022-05-03 DIAGNOSIS — Z9189 Other specified personal risk factors, not elsewhere classified: Secondary | ICD-10-CM | POA: Insufficient documentation

## 2022-05-03 DIAGNOSIS — E669 Obesity, unspecified: Secondary | ICD-10-CM

## 2022-05-03 DIAGNOSIS — Z7985 Long-term (current) use of injectable non-insulin antidiabetic drugs: Secondary | ICD-10-CM

## 2022-05-03 DIAGNOSIS — E1169 Type 2 diabetes mellitus with other specified complication: Secondary | ICD-10-CM

## 2022-05-03 NOTE — Progress Notes (Unsigned)
Chief Complaint:   OBESITY Gloria [name] is here to discuss her progress with her obesity treatment plan along with follow-up of her obesity related diagnoses. Gloria [name] is on the Category 2 Plan and states she is following her eating plan approximately 70% of the time. Gloria [name] states she is doing pilates for 50 minutes 2 times per week.  Today's visit was #: 63 Starting weight: 274 lbs Starting date: 08/07/2021 Today's weight: 254 lbs Today's date: 05/03/2022 Total lbs lost to date: 20 Total lbs lost since last in-office visit: 3  Interim History: Gloria [name] continues to do well with weight loss.  She notes increased hunger in the second half of the day.  She is working on getting her protein in, but her calories often increase to high especially in the p.m.  She is working hard but she is frustrated at how difficult it is, and how slow she is losing weight.  Subjective:   1. Type 2 diabetes mellitus with other specified complication, without long-term current use of insulin (HCC) [name] has been on Ozempic 1 mg for 3-4 months.  She still notes polyphagia in the second half of the day.  2. Essential hypertension Gloria [name]'s blood pressure is elevated today.  She is feeling especially stressed.  She continues to do well with her weight loss.  3. At risk for heart disease Gloria [name] is at higher than average risk for cardiovascular disease due to obesity.  Assessment/Plan:   1. Type 2 diabetes mellitus with other specified complication, without long-term current use of insulin ([place]) Gloria [name] agreed to increase Ozempic to 1.5 mg (no refill needed). We will follow-up in 2 weeks at her next office visit.  Good blood sugar control is important to decrease the likelihood of diabetic complications such as nephropathy, neuropathy, limb loss, blindness, coronary artery disease, and death. Intensive lifestyle modification including diet, exercise and weight loss are the first line of treatment for diabetes.    2. Essential hypertension Gloria [name] will continue her diet, exercise, and medications to improve blood pressure control. We will recheck her blood pressure in 2 weeks at her next visit.  3. At risk for heart disease Gloria [name] was given approximately 15 minutes of coronary artery disease prevention counseling today. She is 51 y.o. female and has risk factors for heart disease including obesity. We discussed intensive lifestyle modifications today with an emphasis on specific weight loss instructions and strategies.  Repetitive spaced learning was employed today to elicit superior memory formation and behavioral change.   4. Obesity, Current BMI 42.3 Gloria [name] is currently in the action stage of change. As such, her goal is to continue with weight loss efforts. She has agreed to keeping a food journal and adhering to recommended goals of 1200-1300 calories and 90 grams of protein daily.    Gloria [name] was reminded that she is losing weight in a safe fashion, and I encouraged her to keep doing well and not to be discouraged.   Exercise goals: As is.   Behavioral modification strategies: increasing lean protein intake.  Gloria [name] has agreed to follow-up with our clinic in 2 weeks. She was informed of the importance of frequent follow-up visits to maximize her success with intensive lifestyle modifications for her multiple health conditions.   Objective:   Blood pressure (!) 145/81, pulse 88, temperature 98.2 F (36.8 C), height 5' 5"  (1.651 m), weight 254 lb (115.2 kg), last menstrual period 04/25/2013, SpO2 97 %. Body mass index is 42.27 kg/m.  General: Cooperative, alert, well  developed, in no acute distress. HEENT: Conjunctivae and lids unremarkable. Cardiovascular: Regular rhythm.  Lungs: Normal work of breathing. Neurologic: No focal deficits.   Lab Results  Component Value Date   CREATININE 0.67 12/28/2021   BUN 9 12/28/2021   NA 131 (L) 12/28/2021   K 4.4 12/28/2021   CL 94 (L)  12/28/2021   CO2 25 12/28/2021   Lab Results  Component Value Date   ALT 15 12/28/2021   AST 17 12/28/2021   ALKPHOS 82 12/28/2021   BILITOT 0.4 12/28/2021   Lab Results  Component Value Date   HGBA1C 5.6 12/28/2021   HGBA1C 6.2 (H) 08/07/2021   HGBA1C 6.3 (A) 05/20/2021   HGBA1C 5.7 (H) 09/19/2020   HGBA1C 5.8 (A) 07/31/2020   Lab Results  Component Value Date   INSULIN 30.5 (H) 12/28/2021   INSULIN 29.5 (H) 08/07/2021   Lab Results  Component Value Date   TSH 2.860 08/07/2021   Lab Results  Component Value Date   CHOL 222 (H) 12/28/2021   HDL 43 12/28/2021   LDLCALC 144 (H) 12/28/2021   LDLDIRECT 145.0 07/18/2019   TRIG 192 (H) 12/28/2021   CHOLHDL 6.1 (H) 09/19/2020   Lab Results  Component Value Date   VD25OH 60.0 12/28/2021   VD25OH 45.2 08/07/2021   VD25OH 57.79 07/18/2019   Lab Results  Component Value Date   WBC 10.4 08/07/2021   HGB 13.2 08/07/2021   HCT 40.2 08/07/2021   MCV 89 08/07/2021   PLT 284 08/07/2021   Lab Results  Component Value Date   IRON 54 10/06/2017   TIBC 304 10/06/2017   FERRITIN 151 (H) 10/06/2017   Attestation Statements:   Reviewed by clinician on day of visit: allergies, medications, problem list, medical history, surgical history, family history, social history, and previous encounter notes.   I, Trixie Dredge, am acting as transcriptionist for Dennard Nip, MD.  I have reviewed the above documentation for accuracy and completeness, and I agree with the above. -  Dennard Nip, MD

## 2022-05-17 ENCOUNTER — Ambulatory Visit (INDEPENDENT_AMBULATORY_CARE_PROVIDER_SITE_OTHER): Payer: 59 | Admitting: Nurse Practitioner

## 2022-05-17 ENCOUNTER — Encounter (INDEPENDENT_AMBULATORY_CARE_PROVIDER_SITE_OTHER): Payer: Self-pay | Admitting: Nurse Practitioner

## 2022-05-17 VITALS — BP 144/78 | HR 69 | Temp 97.2°F | Ht 65.0 in | Wt 258.0 lb

## 2022-05-17 DIAGNOSIS — Z6841 Body Mass Index (BMI) 40.0 and over, adult: Secondary | ICD-10-CM

## 2022-05-17 DIAGNOSIS — E669 Obesity, unspecified: Secondary | ICD-10-CM | POA: Diagnosis not present

## 2022-05-17 DIAGNOSIS — F339 Major depressive disorder, recurrent, unspecified: Secondary | ICD-10-CM | POA: Diagnosis not present

## 2022-05-17 DIAGNOSIS — Z7985 Long-term (current) use of injectable non-insulin antidiabetic drugs: Secondary | ICD-10-CM

## 2022-05-17 DIAGNOSIS — E1169 Type 2 diabetes mellitus with other specified complication: Secondary | ICD-10-CM

## 2022-05-17 MED ORDER — SEMAGLUTIDE (2 MG/DOSE) 8 MG/3ML ~~LOC~~ SOPN: 2.0000 mg | PEN_INJECTOR | SUBCUTANEOUS | 1 refills | Status: DC

## 2022-05-18 NOTE — Progress Notes (Signed)
Chief Complaint:   OBESITY Gloria [name] is here to discuss her progress with her obesity treatment plan along with follow-up of her obesity related diagnoses. [name] is on keeping a food journal and adhering to recommended goals of 1200-1300 calories and 90 grams of protein daily and states she is following her eating plan approximately 80% of the time. [name] states she is doing pilates for 50 minutes 1 time per week.  Today's visit was #: 17 Starting weight: 274 lbs Starting date: 08/07/2021 Today's weight: 258 lbs Today's date: 05/17/2022 Total lbs lost to date: 16 Total lbs lost since last in-office visit: 0  Interim History: Gloria [name] has been under a lot of stress over the past 2 weeks and has been doing some stress eating.  She has a beach trip scheduled for the last week in July.  She is using category 2 as a guideline for journaling.  She has not been skipping meals.  Sometimes she snacks at night on cookies.  She is drinking water, coffee, seltzer water, and she denies soda and juice intake.  She is focusing on eating protein.  She notes stress is making a big difference on her cravings/hunger.  Subjective:   1. Type 2 diabetes mellitus with other specified complication, without long-term current use of insulin (HCC) Asta's last A1c looked better.  She is taking Ozempic 1.5 mg, and she denies side effects.  She is not on statin or ACE.  Her last eye exam was in April 2023.  She has tried metformin, Rybelsus, and glipizide.  She stopped metformin and Rybelsus due to side effects.  She denies hypoglycemia.  2. Depression, recurrent (HCC) with eational eating  Gloria [name] is seeing a psychiatrist on a regular basis.  She is taking Trileptal 600 mg twice daily, and this was prescribed by psychiatry.  She is struggling with increased stress at work and she is stress/emotional eating.  Assessment/Plan:   1. Type 2 diabetes mellitus with other specified complication, without long-term  current use of insulin (HCC) [name] agreed to increase Ozempic to 2 mg once weekly, and we will refill for 2 months.  Side effects were discussed.  - Semaglutide, 2 MG/DOSE, 8 MG/3ML SOPN; Inject 2 mg as directed once a week.  Dispense: 3 mL; Refill: 1  2. Depression, recurrent (HCC) with eational eating  Lawan will refer back to Psychiatry, and we will refer to Dr. Dewaine Conger, our Bariatric Psychologist, for evaluation due to her significant struggles with emotional eating.  3. Obesity, Current BMI 43.0 Gloria [name] is currently in the action stage of change. As such, her goal is to continue with weight loss efforts. She has agreed to keeping a food journal and adhering to recommended goals of 1200-1300 calories and 90+ grams of protein daily.   Exercise goals: As is.   Behavioral modification strategies: increasing lean protein intake, increasing water intake, and planning for success.  [name] has agreed to follow-up with our clinic in 2 weeks. She was informed of the importance of frequent follow-up visits to maximize her success with intensive lifestyle modifications for her multiple health conditions.   Objective:   Blood pressure (!) 144/78, pulse 69, temperature (!) 97.2 F (36.2 C), height 5\' 5"  (1.651 m), weight 258 lb (117 kg), last menstrual period 04/25/2013, SpO2 98 %. Body mass index is 42.93 kg/m.  General: Cooperative, alert, well developed, in no acute distress. HEENT: Conjunctivae and lids unremarkable. Cardiovascular: Regular rhythm.  Lungs: Normal work of breathing. Neurologic: No focal  deficits.   Lab Results  Component Value Date   CREATININE 0.67 12/28/2021   BUN 9 12/28/2021   NA 131 (L) 12/28/2021   K 4.4 12/28/2021   CL 94 (L) 12/28/2021   CO2 25 12/28/2021   Lab Results  Component Value Date   ALT 15 12/28/2021   AST 17 12/28/2021   ALKPHOS 82 12/28/2021   BILITOT 0.4 12/28/2021   Lab Results  Component Value Date   HGBA1C 5.6 12/28/2021   HGBA1C  6.2 (H) 08/07/2021   HGBA1C 6.3 (A) 05/20/2021   HGBA1C 5.7 (H) 09/19/2020   HGBA1C 5.8 (A) 07/31/2020   Lab Results  Component Value Date   INSULIN 30.5 (H) 12/28/2021   INSULIN 29.5 (H) 08/07/2021   Lab Results  Component Value Date   TSH 2.860 08/07/2021   Lab Results  Component Value Date   CHOL 222 (H) 12/28/2021   HDL 43 12/28/2021   LDLCALC 144 (H) 12/28/2021   LDLDIRECT 145.0 07/18/2019   TRIG 192 (H) 12/28/2021   CHOLHDL 6.1 (H) 09/19/2020   Lab Results  Component Value Date   VD25OH 60.0 12/28/2021   VD25OH 45.2 08/07/2021   VD25OH 57.79 07/18/2019   Lab Results  Component Value Date   WBC 10.4 08/07/2021   HGB 13.2 08/07/2021   HCT 40.2 08/07/2021   MCV 89 08/07/2021   PLT 284 08/07/2021   Lab Results  Component Value Date   IRON 54 10/06/2017   TIBC 304 10/06/2017   FERRITIN 151 (H) 10/06/2017   Attestation Statements:   Reviewed by clinician on day of visit: allergies, medications, problem list, medical history, surgical history, family history, social history, and previous encounter notes.   Trude Mcburney, am acting as Energy manager for [employer], FNP-C.  I have reviewed the above documentation for accuracy and completeness, and I agree with the above. Irene Limbo, FNP

## 2022-05-24 ENCOUNTER — Telehealth (INDEPENDENT_AMBULATORY_CARE_PROVIDER_SITE_OTHER): Payer: 59 | Admitting: Psychology

## 2022-05-31 ENCOUNTER — Ambulatory Visit (INDEPENDENT_AMBULATORY_CARE_PROVIDER_SITE_OTHER): Payer: 59 | Admitting: Family Medicine
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
## 2022-06-09 ENCOUNTER — Ambulatory Visit (INDEPENDENT_AMBULATORY_CARE_PROVIDER_SITE_OTHER): Payer: 59 | Admitting: Family Medicine

## 2022-06-09 ENCOUNTER — Encounter (INDEPENDENT_AMBULATORY_CARE_PROVIDER_SITE_OTHER): Payer: Self-pay | Admitting: Family Medicine

## 2022-06-09 VITALS — BP 140/80 | HR 74 | Temp 98.0°F | Ht 65.0 in | Wt 255.0 lb

## 2022-06-09 DIAGNOSIS — Z6841 Body Mass Index (BMI) 40.0 and over, adult: Secondary | ICD-10-CM | POA: Diagnosis not present

## 2022-06-09 DIAGNOSIS — F439 Reaction to severe stress, unspecified: Secondary | ICD-10-CM

## 2022-06-09 DIAGNOSIS — Z7985 Long-term (current) use of injectable non-insulin antidiabetic drugs: Secondary | ICD-10-CM

## 2022-06-09 DIAGNOSIS — E1169 Type 2 diabetes mellitus with other specified complication: Secondary | ICD-10-CM | POA: Diagnosis not present

## 2022-06-09 DIAGNOSIS — E669 Obesity, unspecified: Secondary | ICD-10-CM | POA: Diagnosis not present

## 2022-06-09 MED ORDER — SEMAGLUTIDE (2 MG/DOSE) 8 MG/3ML ~~LOC~~ SOPN: 2.0000 mg | PEN_INJECTOR | SUBCUTANEOUS | 0 refills | Status: DC

## 2022-06-14 ENCOUNTER — Telehealth (INDEPENDENT_AMBULATORY_CARE_PROVIDER_SITE_OTHER): Payer: 59 | Admitting: Psychology

## 2022-06-15 NOTE — Progress Notes (Signed)
Chief Complaint:   OBESITY Gloria [name] is here to discuss her progress with her obesity treatment plan along with follow-up of her obesity related diagnoses. Gloria [name] is on keeping a food journal and adhering to recommended goals of 1200-1300 calories and 90+ grams of protein daily and states she is following her eating plan approximately 60% of the time. Gloria [name] states she is doing 0 minutes 0 times per week.  Today's visit was #: 36 Starting weight: 274 lbs Starting date: 08/07/2021 Today's weight: 255 lbs Today's date: 06/09/2022 Total lbs lost to date: 19 Total lbs lost since last in-office visit: 3  Interim History: Gloria [name] continues to do well with weight loss.  She has struggled a bit more with stress, but she has still been able to portion control and meet her protein goals.  Subjective:   1. Stress Gloria [name] notes increased stress at work.  She is working on Engineer, manufacturing eating behaviors.  2. Type 2 diabetes mellitus with other specified complication, unspecified whether long term insulin use (HCC) Gloria [name] is stable on her Ozempic.  Assessment/Plan:   1. Stress Gloria [name] will continue to work on avoiding emotional eating behaviors, and we will continue to follow.  2. Type 2 diabetes mellitus with other specified complication, unspecified whether long term insulin use ([place]) Gloria [name] will continue Ozempic 2 mg once weekly, and we will refill for 1 month.  - Semaglutide, 2 MG/DOSE, 8 MG/3ML SOPN; Inject 2 mg as directed once a week.  Dispense: 3 mL; Refill: 0  3. Obesity, Current BMI 42.5 Gloria [name] is currently in the action stage of change. As such, her goal is to continue with weight loss efforts. She has agreed to the Category 2 Plan.   Behavioral modification strategies: increasing lean protein intake, increasing water intake, and emotional eating strategies.  Gloria [name] has agreed to follow-up with our clinic in 2 to 3 weeks. She was informed of the importance of  frequent follow-up visits to maximize her success with intensive lifestyle modifications for her multiple health conditions.   Objective:   Blood pressure 140/80, pulse 74, temperature 98 F (36.7 C), height 5' 5"  (1.651 m), weight 255 lb (115.7 kg), last menstrual period 04/25/2013, SpO2 98 %. Body mass index is 42.43 kg/m.  General: Cooperative, alert, well developed, in no acute distress. HEENT: Conjunctivae and lids unremarkable. Cardiovascular: Regular rhythm.  Lungs: Normal work of breathing. Neurologic: No focal deficits.   Lab Results  Component Value Date   CREATININE 0.67 12/28/2021   BUN 9 12/28/2021   NA 131 (L) 12/28/2021   K 4.4 12/28/2021   CL 94 (L) 12/28/2021   CO2 25 12/28/2021   Lab Results  Component Value Date   ALT 15 12/28/2021   AST 17 12/28/2021   ALKPHOS 82 12/28/2021   BILITOT 0.4 12/28/2021   Lab Results  Component Value Date   HGBA1C 5.6 12/28/2021   HGBA1C 6.2 (H) 08/07/2021   HGBA1C 6.3 (A) 05/20/2021   HGBA1C 5.7 (H) 09/19/2020   HGBA1C 5.8 (A) 07/31/2020   Lab Results  Component Value Date   INSULIN 30.5 (H) 12/28/2021   INSULIN 29.5 (H) 08/07/2021   Lab Results  Component Value Date   TSH 2.860 08/07/2021   Lab Results  Component Value Date   CHOL 222 (H) 12/28/2021   HDL 43 12/28/2021   LDLCALC 144 (H) 12/28/2021   LDLDIRECT 145.0 07/18/2019   TRIG 192 (H) 12/28/2021   CHOLHDL 6.1 (H) 09/19/2020   Lab Results  Component Value Date   VD25OH 60.0 12/28/2021   VD25OH 45.2 08/07/2021   VD25OH 57.79 07/18/2019   Lab Results  Component Value Date   WBC 10.4 08/07/2021   HGB 13.2 08/07/2021   HCT 40.2 08/07/2021   MCV 89 08/07/2021   PLT 284 08/07/2021   Lab Results  Component Value Date   IRON 54 10/06/2017   TIBC 304 10/06/2017   FERRITIN 151 (H) 10/06/2017   Attestation Statements:   Reviewed by clinician on day of visit: allergies, medications, problem list, medical history, surgical history, family  history, social history, and previous encounter notes.   I, Trixie Dredge, am acting as transcriptionist for Dennard Nip, MD.  I have reviewed the above documentation for accuracy and completeness, and I agree with the above. -  Dennard Nip, MD

## 2022-06-24 ENCOUNTER — Ambulatory Visit (INDEPENDENT_AMBULATORY_CARE_PROVIDER_SITE_OTHER): Payer: 59 | Admitting: Nurse Practitioner

## 2022-06-24 ENCOUNTER — Encounter (INDEPENDENT_AMBULATORY_CARE_PROVIDER_SITE_OTHER): Payer: Self-pay | Admitting: Nurse Practitioner

## 2022-06-24 VITALS — BP 145/81 | HR 70 | Ht 65.0 in | Wt 253.0 lb

## 2022-06-24 DIAGNOSIS — E669 Obesity, unspecified: Secondary | ICD-10-CM

## 2022-06-24 DIAGNOSIS — E1169 Type 2 diabetes mellitus with other specified complication: Secondary | ICD-10-CM | POA: Diagnosis not present

## 2022-06-24 DIAGNOSIS — Z7985 Long-term (current) use of injectable non-insulin antidiabetic drugs: Secondary | ICD-10-CM

## 2022-06-24 DIAGNOSIS — Z6841 Body Mass Index (BMI) 40.0 and over, adult: Secondary | ICD-10-CM | POA: Diagnosis not present

## 2022-06-24 DIAGNOSIS — I10 Essential (primary) hypertension: Secondary | ICD-10-CM | POA: Diagnosis not present

## 2022-06-24 DIAGNOSIS — E559 Vitamin D deficiency, unspecified: Secondary | ICD-10-CM

## 2022-06-24 MED ORDER — SEMAGLUTIDE (2 MG/DOSE) 8 MG/3ML ~~LOC~~ SOPN: 2.0000 mg | PEN_INJECTOR | SUBCUTANEOUS | 0 refills | Status: DC

## 2022-06-29 NOTE — Progress Notes (Signed)
Chief Complaint:   OBESITY Gloria [name] is here to discuss her progress with her obesity treatment plan along with follow-up of her obesity related diagnoses. Gloria [name] is on the Category 2 Plan and states she is following her eating plan approximately 50% of the time. Gloria [name] states she is exercising 0 minutes 0 times per week.  Today's visit was #: 68 Starting weight: 274 lbs Starting date: 08/07/2021 Today's weight: 253 lbs Today's date: 06/24/2022 Total lbs lost to date: 21 lbs Total lbs lost since last in-office visit: 2  Interim History: Gloria [name] has done well with weight loss since her last visit. Recently returned from vacation. She is under some stress at work recently. Denies stress eating. Got back on Cat 2 plan this week. Drinking coffee, water and seltzer water. Denies hunger, craving sweets. She is meeting her protein goals.  Subjective:   1. Type 2 diabetes mellitus with other specified complication, unspecified whether long term insulin use (HCC) Gloria [name] is currently taking Ozempic 2 mg. Denies any side effects. She is not checking her blood sugars at home. Last eye exam: 2023. Denies hypoglycemia. She is not on a statin but on ACE/ARB. Family history: father. Took Metformin, Rybelsus, Glipizide and Metformin, stopped due to side effects.  2. Essential hypertension Gloria [name]'s blood pressure is elevated today. denies any chest pain,shortness of breath or palpitations. She has never been on medication. No family history of hypertension.   Assessment/Plan:   1. Type 2 diabetes mellitus with other specified complication, unspecified whether long term insulin use (HCC) We will refill Ozempic 2 mg SubQ once weekly for 1 month with 0 refills. Side effects discussed.   Good blood sugar control is important to decrease the likelihood of diabetic complications such as nephropathy, neuropathy, limb loss, blindness, coronary artery disease, and death. Intensive lifestyle modification  including diet, exercise and weight loss are the first line of treatment for diabetes.    -Refill Semaglutide, 2 MG/DOSE, 8 MG/3ML SOPN; Inject 2 mg as directed once a week.  Dispense: 3 mL; Refill: 0  2. Essential hypertension Gloria [name] is to discuss with PCP.  Gloria [name] is working on healthy weight loss and exercise to improve blood pressure control. We will watch for signs of hypotension as she continues her lifestyle modifications.   3. Obesity, Current BMI 42.1 Gloria [name] is currently in the action stage of change. As such, her goal is to continue with weight loss efforts. She has agreed to the Category 2 Plan.   We will obtain Fasting labs at next visit.  Exercise goals: All adults should avoid inactivity. Some physical activity is better than none, and adults who participate in any amount of physical activity gain some health benefits.  Behavioral modification strategies: increasing lean protein intake, increasing vegetables, and increasing water intake.  Gloria [name] has agreed to follow-up with our clinic in 3 weeks. She was informed of the importance of frequent follow-up visits to maximize her success with intensive lifestyle modifications for her multiple health conditions.   Objective:   Blood pressure (!) 145/81, pulse 70, height 5' 5"  (1.651 m), weight 253 lb (114.8 kg), last menstrual period 04/25/2013, SpO2 97 %. Body mass index is 42.1 kg/m.  General: Cooperative, alert, well developed, in no acute distress. HEENT: Conjunctivae and lids unremarkable. Cardiovascular: Regular rhythm.  Lungs: Normal work of breathing. Neurologic: No focal deficits.   Lab Results  Component Value Date   CREATININE 0.67 12/28/2021   BUN 9 12/28/2021   NA 131 (L) 12/28/2021  K 4.4 12/28/2021   CL 94 (L) 12/28/2021   CO2 25 12/28/2021   Lab Results  Component Value Date   ALT 15 12/28/2021   AST 17 12/28/2021   ALKPHOS 82 12/28/2021   BILITOT 0.4 12/28/2021   Lab Results  Component  Value Date   HGBA1C 5.6 12/28/2021   HGBA1C 6.2 (H) 08/07/2021   HGBA1C 6.3 (A) 05/20/2021   HGBA1C 5.7 (H) 09/19/2020   HGBA1C 5.8 (A) 07/31/2020   Lab Results  Component Value Date   INSULIN 30.5 (H) 12/28/2021   INSULIN 29.5 (H) 08/07/2021   Lab Results  Component Value Date   TSH 2.860 08/07/2021   Lab Results  Component Value Date   CHOL 222 (H) 12/28/2021   HDL 43 12/28/2021   LDLCALC 144 (H) 12/28/2021   LDLDIRECT 145.0 07/18/2019   TRIG 192 (H) 12/28/2021   CHOLHDL 6.1 (H) 09/19/2020   Lab Results  Component Value Date   VD25OH 60.0 12/28/2021   VD25OH 45.2 08/07/2021   VD25OH 57.79 07/18/2019   Lab Results  Component Value Date   WBC 10.4 08/07/2021   HGB 13.2 08/07/2021   HCT 40.2 08/07/2021   MCV 89 08/07/2021   PLT 284 08/07/2021   Lab Results  Component Value Date   IRON 54 10/06/2017   TIBC 304 10/06/2017   FERRITIN 151 (H) 10/06/2017   Attestation Statements:   Reviewed by clinician on day of visit: allergies, medications, problem list, medical history, surgical history, family history, social history, and previous encounter notes.  I, Brendell Tyus, RMA, am acting as transcriptionist for Everardo Pacific, FNP.  I have reviewed the above documentation for accuracy and completeness, and I agree with the above. Everardo Pacific, FNP

## 2022-06-30 ENCOUNTER — Encounter (INDEPENDENT_AMBULATORY_CARE_PROVIDER_SITE_OTHER): Payer: Self-pay

## 2022-07-04 ENCOUNTER — Other Ambulatory Visit (INDEPENDENT_AMBULATORY_CARE_PROVIDER_SITE_OTHER): Payer: Self-pay | Admitting: Physician Assistant

## 2022-07-04 DIAGNOSIS — E559 Vitamin D deficiency, unspecified: Secondary | ICD-10-CM

## 2022-07-14 ENCOUNTER — Encounter (INDEPENDENT_AMBULATORY_CARE_PROVIDER_SITE_OTHER): Payer: Self-pay | Admitting: Family Medicine

## 2022-07-14 ENCOUNTER — Ambulatory Visit (INDEPENDENT_AMBULATORY_CARE_PROVIDER_SITE_OTHER): Payer: 59 | Admitting: Family Medicine

## 2022-07-14 VITALS — BP 141/82 | HR 79 | Temp 97.7°F | Ht 65.0 in | Wt 251.0 lb

## 2022-07-14 DIAGNOSIS — E669 Obesity, unspecified: Secondary | ICD-10-CM | POA: Diagnosis not present

## 2022-07-14 DIAGNOSIS — E559 Vitamin D deficiency, unspecified: Secondary | ICD-10-CM | POA: Diagnosis not present

## 2022-07-14 DIAGNOSIS — E782 Mixed hyperlipidemia: Secondary | ICD-10-CM | POA: Diagnosis not present

## 2022-07-14 DIAGNOSIS — Z6841 Body Mass Index (BMI) 40.0 and over, adult: Secondary | ICD-10-CM

## 2022-07-14 DIAGNOSIS — E1169 Type 2 diabetes mellitus with other specified complication: Secondary | ICD-10-CM

## 2022-07-14 DIAGNOSIS — Z7985 Long-term (current) use of injectable non-insulin antidiabetic drugs: Secondary | ICD-10-CM

## 2022-07-14 MED ORDER — SEMAGLUTIDE (2 MG/DOSE) 8 MG/3ML ~~LOC~~ SOPN: 2.0000 mg | PEN_INJECTOR | SUBCUTANEOUS | 0 refills | Status: DC

## 2022-07-15 ENCOUNTER — Ambulatory Visit (INDEPENDENT_AMBULATORY_CARE_PROVIDER_SITE_OTHER): Payer: 59 | Admitting: Family Medicine

## 2022-07-15 LAB — INSULIN, RANDOM: INSULIN: 41.2 u[IU]/mL — ABNORMAL HIGH (ref 2.6–24.9)

## 2022-07-15 LAB — VITAMIN D 25 HYDROXY (VIT D DEFICIENCY, FRACTURES): Vit D, 25-Hydroxy: 64 ng/mL (ref 30.0–100.0)

## 2022-07-15 LAB — CMP14+EGFR
ALT: 22 IU/L (ref 0–32)
AST: 19 IU/L (ref 0–40)
Albumin/Globulin Ratio: 1.6 (ref 1.2–2.2)
Albumin: 4.2 g/dL (ref 3.8–4.9)
Alkaline Phosphatase: 79 IU/L (ref 44–121)
BUN/Creatinine Ratio: 12 (ref 9–23)
BUN: 7 mg/dL (ref 6–24)
Bilirubin Total: <0.2 mg/dL (ref 0.0–1.2)
CO2: 23 mmol/L (ref 20–29)
Calcium: 9.1 mg/dL (ref 8.7–10.2)
Chloride: 97 mmol/L (ref 96–106)
Creatinine, Ser: 0.58 mg/dL (ref 0.57–1.00)
Globulin, Total: 2.6 g/dL (ref 1.5–4.5)
Glucose: 107 mg/dL — ABNORMAL HIGH (ref 70–99)
Potassium: 4.3 mmol/L (ref 3.5–5.2)
Sodium: 133 mmol/L — ABNORMAL LOW (ref 134–144)
Total Protein: 6.8 g/dL (ref 6.0–8.5)
eGFR: 109 mL/min/{1.73_m2} (ref >59)

## 2022-07-15 LAB — LIPID PANEL WITH LDL/HDL RATIO
Cholesterol, Total: 216 mg/dL — ABNORMAL HIGH (ref 100–199)
HDL: 44 mg/dL (ref >39)
LDL Chol Calc (NIH): 139 mg/dL — ABNORMAL HIGH (ref 0–99)
LDL/HDL Ratio: 3.2 ratio (ref 0.0–3.2)
Triglycerides: 185 mg/dL — ABNORMAL HIGH (ref 0–149)
VLDL Cholesterol Cal: 33 mg/dL (ref 5–40)

## 2022-07-15 LAB — HEMOGLOBIN A1C
Est. average glucose Bld gHb Est-mCnc: 114 mg/dL
Hgb A1c MFr Bld: 5.6 % (ref 4.8–5.6)

## 2022-07-21 ENCOUNTER — Ambulatory Visit (INDEPENDENT_AMBULATORY_CARE_PROVIDER_SITE_OTHER): Payer: 59 | Admitting: Family Medicine

## 2022-07-21 VITALS — BP 140/88 | HR 82 | Temp 98.9°F | Wt 253.2 lb

## 2022-07-21 DIAGNOSIS — S30861A Insect bite (nonvenomous) of abdominal wall, initial encounter: Secondary | ICD-10-CM | POA: Diagnosis not present

## 2022-07-21 DIAGNOSIS — M797 Fibromyalgia: Secondary | ICD-10-CM | POA: Diagnosis not present

## 2022-07-21 DIAGNOSIS — R030 Elevated blood-pressure reading, without diagnosis of hypertension: Secondary | ICD-10-CM | POA: Diagnosis not present

## 2022-07-21 DIAGNOSIS — W57XXXA Bitten or stung by nonvenomous insect and other nonvenomous arthropods, initial encounter: Secondary | ICD-10-CM | POA: Diagnosis not present

## 2022-07-21 NOTE — Progress Notes (Signed)
Subjective:    Patient ID: Gloria [name], female    DOB: [birth year], 51 y.o.   MRN: [id number]  Chief Complaint  Patient presents with   bite marks    3 bite marks on left side of abdomen. No pain, itched periodically, but not real bad. Has not put anything on it. Thought it was mosquito bites, but have not went away.     HPI Patient is a 51 yo female with pmh sig for fibromyalgia, DM, h/o hepatitis, arthritis, anxiety, HLD, OSA who was seen today for bumps.  Pt endorses tiny, initially pruritic, red bumps on abd.  States [place] the beach [place], but does not recall bites.     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   DDD (degenerative disc disease), lumbar    Depression    Diabetes ([place])    Diverticulosis    Drug use    Elevated LFTs    Fibromyalgia    GERD (gastroesophageal reflux disease)    History of ETOH abuse    Hyperlipidemia    Hyperplastic colon polyp    Internal hemorrhoids    Migraines    NAFLD (nonalcoholic fatty liver disease)    Obese    Obstructive sleep apnea 02/25/2015   not diagnosed per pt 04-03-20   Osteoarthritis    lower back- DDD   Periodic limb movement disorder 02/25/2014   Pre-diabetes    pt taking metformin   RLS (restless legs syndrome)    Sleep apnea    Smoker    Snoring    Substance abuse (HCC)    Swallowing difficulty    Vitamin B12 deficiency    Vitamin D deficiency     Allergies  Allergen Reactions   Benadryl [Diphenhydramine Hcl] Hives   Ropinirole Other (See Comments)   Levofloxacin Other (See Comments)   Aspirin    Codeine    Doxycycline    Metformin And Related     Blurred vision   Nuvigil [Armodafinil]      migraines    Requip [Ropinirole Hcl]     Increased headache   Semaglutide Other (See Comments)    Rybelsus caused depression and fibromyalgia flair.   Tramadol     Other reaction(s): Other (See Comments)   Wellbutrin [Bupropion]    Zolpidem     ROS General: Denies fever, chills, night sweats, changes in weight, changes in appetite HEENT: Denies headaches, ear pain, changes in vision, rhinorrhea, sore throat CV: Denies CP, palpitations, SOB, orthopnea Pulm: Denies SOB, cough, wheezing GI: Denies abdominal pain, nausea, vomiting, diarrhea, constipation GU: Denies dysuria, hematuria, frequency, vaginal discharge Msk: Denies muscle cramps, joint pains Neuro: Denies weakness, numbness, tingling Skin: Denies rashes, bruising  +bumps on abd Psych: Denies depression, anxiety, hallucinations      Objective:    Blood pressure (!) 142/102, pulse 82, temperature 98.9 F (37.2 C), temperature source Oral, weight 253 lb 3.2 oz (114.9 kg), last menstrual period 04/25/2013, SpO2 97 %.  Gen. Pleasant, well-nourished, in no distress, normal affect  HEENT: [place]/AT, face symmetric, conjunctiva clear, no scleral icterus, PERRLA, EOMI, nares patent without drainage Lungs: no accessory muscle use, CTAB, no wheezes or rales Cardiovascular: RRR, no m/r/g, no peripheral edema Musculoskeletal: No deformities, no cyanosis or clubbing, normal tone Neuro:  A&Ox3, CN II-XII intact, normal gait Skin:  Warm, dry, intact. 3 slightly raised 4 mm papules on L abd.  BP Readings from Last 3 Encounters:  07/21/22 (!) 142/102  07/14/22 (!) 141/82  06/24/22 (!) 145/81     Wt Readings from Last 3 Encounters:  07/14/22 251 lb (113.9 kg)  06/24/22 253 lb (114.8 kg)  06/09/22 255 lb (115.7 kg)    Lab Results  Component Value Date   WBC 10.4 08/07/2021   HGB 13.2 08/07/2021   HCT 40.2 08/07/2021   PLT 284 08/07/2021   GLUCOSE 107 (H) 07/14/2022   CHOL 216 (H) 07/14/2022   TRIG 185 (H) 07/14/2022    HDL 44 07/14/2022   LDLDIRECT 145.0 07/18/2019   LDLCALC 139 (H) 07/14/2022   ALT 22 07/14/2022   AST 19 07/14/2022   NA 133 (L) 07/14/2022   K 4.3 07/14/2022   CL 97 07/14/2022   CREATININE 0.58 07/14/2022   BUN 7 07/14/2022   CO2 23 07/14/2022   TSH 2.860 08/07/2021   INR 1.0 07/21/2020   HGBA1C 5.6 07/14/2022   MICROALBUR <0.7 03/13/2020    Assessment/Plan:  Insect bite of abdominal wall, initial encounter -supportive care, antihistamine, etc. -possibly 2/2 sand fleas as pt was sitting in/on sand  Elevated blood pressure reading without diagnosis of hypertension -bp recheck 140/88 -pt to check bp at home.  For continued elevation in bp consistently >140/80 will start medication -lifestyle modifications  Fibromyalgia -stable  F/u in 1 month for bp  Grier Mitts, MD

## 2022-07-21 NOTE — Patient Instructions (Addendum)
And low-sodium diet is 2000-2500 mg of salt per day.  Start reading labels on the products you purchase.  We will have you follow-up in about a month to check on his blood pressure.  Obtain a blood pressure wrist cuff monitor BP at home.  Keep a log of the readings to bring with you to your appointments.

## 2022-07-22 NOTE — Progress Notes (Signed)
Chief Complaint:   OBESITY Gloria [name] is here to discuss her progress with her obesity treatment plan along with follow-up of her obesity related diagnoses. Gloria [name] is on the Category 2 Plan and states she is following her eating plan approximately 85% of the time. Gloria [name] states she is walking for 30 minutes 3 times per week.  Today's visit was #: 20 Starting weight: 274 lbs Starting date: 08/07/2021 Today's weight: 251 lbs Today's date: 07/14/2022 Total lbs lost to date: 23 Total lbs lost since last in-office visit: 2  Interim History: Gloria [name] continues to do well with weight loss.  She is close to her next weight goal of below 250 pounds.  She has done a lot of traveling recently, but will be home for a while and she will be able to meal plan more effectively.  Subjective:   1. Type 2 diabetes mellitus with other specified complication, unspecified whether long term insulin use (HCC) Gloria [name] is stable on Ozempic, and she denies nausea or vomiting.  She is doing well with her diet.  2. Mixed hyperlipidemia [name]'s cholesterol labs were not at goal on her last labs.  She continues to work on decreasing cholesterol in her diet.  3. Vitamin D deficiency Gloria [name] is on vitamin D, and she had taken an extra dose of vitamin D last week but no side effects were noted.  Assessment/Plan:   1. Type 2 diabetes mellitus with other specified complication, unspecified whether long term insulin use (HCC) We will check labs today, and we will refill Ozempic 2 mg once weekly for 1 month.  - CMP14+EGFR - Insulin, random - Hemoglobin A1c - Semaglutide, 2 MG/DOSE, 8 MG/3ML SOPN; Inject 2 mg as directed once a week.  Dispense: 3 mL; Refill: 0  2. Mixed hyperlipidemia We will check labs today. Cardiovascular risk and specific lipid/LDL goals reviewed.  We discussed several lifestyle modifications today and Julyana will continue to work on diet, exercise and weight loss efforts. Orders and follow  up as documented in patient record.   - Lipid Panel With LDL/HDL Ratio  3. Vitamin D deficiency We will check labs today. [name] will follow-up for routine testing of Vitamin D, at least 2-3 times per year to avoid over-replacement.  - VITAMIN D 25 Hydroxy (Vit-D Deficiency, Fractures)  4. Obesity, Current BMI 41.8 Gloria [name] is currently in the action stage of change. As such, her goal is to continue with weight loss efforts. She has agreed to the Category 2 Plan.   Exercise goals: As is.   Behavioral modification strategies: increasing lean protein intake and increasing water intake.  Gloria [name] has agreed to follow-up with our clinic in 3 weeks. She was informed of the importance of frequent follow-up visits to maximize her success with intensive lifestyle modifications for her multiple health conditions.   [name] was informed we would discuss her lab results at her next visit unless there is a critical issue that needs to be addressed sooner. [name] agreed to keep her next visit at the agreed upon time to discuss these results.  Objective:   Blood pressure (!) 141/82, pulse 79, temperature 97.7 F (36.5 C), height _0  (1.651 m), weight 251 lb (113.9 kg), last menstrual period 04/25/2013, SpO2 98 %. Body mass index is 41.77 kg/m.  General: Cooperative, alert, well developed, in no acute distress. HEENT: Conjunctivae and lids unremarkable. Cardiovascular: Regular rhythm.  Lungs: Normal work of breathing. Neurologic: No focal deficits.   Lab Results  Component Value Date  CREATININE 0.58 07/14/2022   BUN 7 07/14/2022   NA 133 (L) 07/14/2022   K 4.3 07/14/2022   CL 97 07/14/2022   CO2 23 07/14/2022   Lab Results  Component Value Date   ALT 22 07/14/2022   AST 19 07/14/2022   ALKPHOS 79 07/14/2022   BILITOT <0.2 07/14/2022   Lab Results  Component Value Date   HGBA1C 5.6 07/14/2022   HGBA1C 5.6 12/28/2021   HGBA1C 6.2 (H) 08/07/2021   HGBA1C 6.3 (A) 05/20/2021    HGBA1C 5.7 (H) 09/19/2020   Lab Results  Component Value Date   INSULIN 41.2 (H) 07/14/2022   INSULIN 30.5 (H) 12/28/2021   INSULIN 29.5 (H) 08/07/2021   Lab Results  Component Value Date   TSH 2.860 08/07/2021   Lab Results  Component Value Date   CHOL 216 (H) 07/14/2022   HDL 44 07/14/2022   LDLCALC 139 (H) 07/14/2022   LDLDIRECT 145.0 07/18/2019   TRIG 185 (H) 07/14/2022   CHOLHDL 6.1 (H) 09/19/2020   Lab Results  Component Value Date   VD25OH 64.0 07/14/2022   VD25OH 60.0 12/28/2021   VD25OH 45.2 08/07/2021   Lab Results  Component Value Date   WBC 10.4 08/07/2021   HGB 13.2 08/07/2021   HCT 40.2 08/07/2021   MCV 89 08/07/2021   PLT 284 08/07/2021   Lab Results  Component Value Date   IRON 54 10/06/2017   TIBC 304 10/06/2017   FERRITIN 151 (H) 10/06/2017   Attestation Statements:   Reviewed by clinician on day of visit: allergies, medications, problem list, medical history, surgical history, family history, social history, and previous encounter notes.    I, Trixie Dredge, am acting as transcriptionist for Dennard Nip, MD.  I have reviewed the above documentation for accuracy and completeness, and I agree with the above. -  Dennard Nip, MD

## 2022-08-01 ENCOUNTER — Encounter: Payer: Self-pay | Admitting: Family Medicine

## 2022-08-05 ENCOUNTER — Ambulatory Visit (INDEPENDENT_AMBULATORY_CARE_PROVIDER_SITE_OTHER): Payer: 59 | Admitting: Family Medicine

## 2022-08-05 ENCOUNTER — Encounter (INDEPENDENT_AMBULATORY_CARE_PROVIDER_SITE_OTHER): Payer: Self-pay | Admitting: Family Medicine

## 2022-08-05 VITALS — BP 154/72 | HR 65 | Temp 98.0°F | Ht 65.0 in | Wt 250.0 lb

## 2022-08-05 DIAGNOSIS — E1169 Type 2 diabetes mellitus with other specified complication: Secondary | ICD-10-CM

## 2022-08-05 DIAGNOSIS — Z7985 Long-term (current) use of injectable non-insulin antidiabetic drugs: Secondary | ICD-10-CM

## 2022-08-05 DIAGNOSIS — R030 Elevated blood-pressure reading, without diagnosis of hypertension: Secondary | ICD-10-CM | POA: Diagnosis not present

## 2022-08-05 DIAGNOSIS — Z6841 Body Mass Index (BMI) 40.0 and over, adult: Secondary | ICD-10-CM | POA: Diagnosis not present

## 2022-08-05 DIAGNOSIS — E669 Obesity, unspecified: Secondary | ICD-10-CM

## 2022-08-05 MED ORDER — SEMAGLUTIDE (2 MG/DOSE) 8 MG/3ML ~~LOC~~ SOPN: 2.0000 mg | PEN_INJECTOR | SUBCUTANEOUS | 0 refills | Status: DC

## 2022-08-09 NOTE — Progress Notes (Unsigned)
Chief Complaint:   OBESITY Gloria [name] is here to discuss her progress with her obesity treatment plan along with follow-up of her obesity related diagnoses. [name] is on {MWMwtlossportion/plan2:23431} and states she is following her eating plan approximately ***% of the time. [name] states she is *** *** minutes *** times per week.  Today's visit was #: *** Starting weight: *** Starting date: *** Today's weight: *** Today's date: 08/05/2022 Total lbs lost to date: *** Total lbs lost since last in-office visit: ***  Interim History: ***  Subjective:   1. Type 2 diabetes mellitus with other specified complication, unspecified whether long term insulin use (HCC) ***  2. White coat syndrome without hypertension ***  Assessment/Plan:   1. Type 2 diabetes mellitus with other specified complication, unspecified whether long term insulin use (HCC) *** - Semaglutide, 2 MG/DOSE, 8 MG/3ML SOPN; Inject 2 mg as directed once a week.  Dispense: 3 mL; Refill: 0  2. White coat syndrome without hypertension ***  3. Obesity, Current BMI 41.6 Gloria [name] is currently in the action stage of change. As such, her goal is to continue with weight loss efforts. She has agreed to the Category 2 Plan.   Exercise goals: As is.   Behavioral modification strategies: increasing lean protein intake and decreasing sodium intake.  Gloria [name] has agreed to follow-up with our clinic in 3 weeks. She was informed of the importance of frequent follow-up visits to maximize her success with intensive lifestyle modifications for her multiple health conditions.   Objective:   Blood pressure (!) 154/72, pulse 65, temperature 98 F (36.7 C), height 5' 5"  (1.651 m), weight 250 lb (113.4 kg), last menstrual period 04/25/2013, SpO2 99 %. Body mass index is 41.6 kg/m.  General: Cooperative, alert, well developed, in no acute distress. HEENT: Conjunctivae and lids unremarkable. Cardiovascular: Regular rhythm.   Lungs: Normal work of breathing. Neurologic: No focal deficits.   Lab Results  Component Value Date   CREATININE 0.58 07/14/2022   BUN 7 07/14/2022   NA 133 (L) 07/14/2022   K 4.3 07/14/2022   CL 97 07/14/2022   CO2 23 07/14/2022   Lab Results  Component Value Date   ALT 22 07/14/2022   AST 19 07/14/2022   ALKPHOS 79 07/14/2022   BILITOT <0.2 07/14/2022   Lab Results  Component Value Date   HGBA1C 5.6 07/14/2022   HGBA1C 5.6 12/28/2021   HGBA1C 6.2 (H) 08/07/2021   HGBA1C 6.3 (A) 05/20/2021   HGBA1C 5.7 (H) 09/19/2020   Lab Results  Component Value Date   INSULIN 41.2 (H) 07/14/2022   INSULIN 30.5 (H) 12/28/2021   INSULIN 29.5 (H) 08/07/2021   Lab Results  Component Value Date   TSH 2.860 08/07/2021   Lab Results  Component Value Date   CHOL 216 (H) 07/14/2022   HDL 44 07/14/2022   LDLCALC 139 (H) 07/14/2022   LDLDIRECT 145.0 07/18/2019   TRIG 185 (H) 07/14/2022   CHOLHDL 6.1 (H) 09/19/2020   Lab Results  Component Value Date   VD25OH 64.0 07/14/2022   VD25OH 60.0 12/28/2021   VD25OH 45.2 08/07/2021   Lab Results  Component Value Date   WBC 10.4 08/07/2021   HGB 13.2 08/07/2021   HCT 40.2 08/07/2021   MCV 89 08/07/2021   PLT 284 08/07/2021   Lab Results  Component Value Date   IRON 54 10/06/2017   TIBC 304 10/06/2017   FERRITIN 151 (H) 10/06/2017   Attestation Statements:   Reviewed by clinician  on day of visit: allergies, medications, problem list, medical history, surgical history, family history, social history, and previous encounter notes.   I, Trixie Dredge, am acting as transcriptionist for Dennard Nip, MD.  I have reviewed the above documentation for accuracy and completeness, and I agree with the above. -  ***

## 2022-08-10 ENCOUNTER — Encounter: Payer: Self-pay | Admitting: *Deleted

## 2022-08-11 ENCOUNTER — Ambulatory Visit (INDEPENDENT_AMBULATORY_CARE_PROVIDER_SITE_OTHER): Payer: 59 | Admitting: Adult Health

## 2022-08-11 ENCOUNTER — Encounter: Payer: Self-pay | Admitting: Adult Health

## 2022-08-11 VITALS — BP 141/89 | HR 78 | Ht 65.0 in | Wt 253.0 lb

## 2022-08-11 DIAGNOSIS — G4733 Obstructive sleep apnea (adult) (pediatric): Secondary | ICD-10-CM | POA: Diagnosis not present

## 2022-08-11 DIAGNOSIS — R0981 Nasal congestion: Secondary | ICD-10-CM | POA: Diagnosis not present

## 2022-08-11 DIAGNOSIS — Z9989 Dependence on other enabling machines and devices: Secondary | ICD-10-CM | POA: Diagnosis not present

## 2022-08-11 NOTE — Progress Notes (Signed)
  PATIENT: Gloria [name] DOB: 02/10/1971  REASON FOR VISIT: follow up HISTORY FROM: patient PRIMARY NEUROLOGIST: Dr. Dohmeier  HISTORY OF PRESENT ILLNESS: Today 08/11/22:  [name] is a 51 year old female with a history of OSA on CPAP.  She returns today for follow-up.  Her download indicates that she used her machine nightly.  She uses her machine greater than 4 hours 7.8 hours.  Her residual AHI is 0.4 on 6 to 13 cm of water.  The patient reports that she uses the DreamWear full facemask.  She states that she is having bleeding gums and trouble with her tooth.  Her dentist feels that this is related to CPAP.  Patient also reports that she has chronic nasal congestion particularly in the left nare.  She has never seen ENT for this.  She returns today for an evaluation.  08/11/21: [name] is a 50-year-old female with a history of obstructive sleep apnea on CPAP.  She returns today for follow-up.  Her download indicates that she used her machine 23 out of 30 days for compliance of 77%.  She use her machine greater than 4 hours 17 days for compliance of 57%.  On average she uses her machine 5 hours and 4 minutes.  Her residual AHI is 0.2 on 6 to 13 cm of water.  Her leak in the 95th percentile is 9.8 L/min.  Patient reports that she has been having trouble with her mask leaking during the night.  She has had a mask refitting.  Reports now if she pulls her straps tight initially it typically prevents the mask from leaking and she is able to use it longer.  HISTORY .(Copied from Dr.Dohmeier's note) Gloria [name] is a 51 year- old  Caucasian female patient of Dr.Willis' and seen here upon his and NP's request  on 09/23/2020 for a sleep consultation.   Chief concern according to patient :  " I am chronically fatigued but also a recovering alcoholic, and my last sleep study was about 12 years ago- and I don't know how my baseline has changed since".        I have the pleasure of seeing  Gloria [name] today, a right-handed White or Caucasian female with a possible sleep disorder.  She   has a past medical history of Allergy, Anemia, Anxiety, Arthritis, Bulging disc, Chronic active hepatitis (HCC), Chronic back pain (02/25/2015), Chronic headaches, Chronic kidney disease, Depression, Diverticulosis, Elevated LFTs, Fibromyalgia, GERD (gastroesophageal reflux disease), History of ETOH abuse, Hyperlipidemia, Hyperplastic colon polyp, Internal hemorrhoids, NAFLD (nonalcoholic fatty liver disease), Obese, Obstructive sleep apnea (02/25/2015), Osteoarthritis, Periodic limb movement disorder (02/25/2014), Pre-diabetes, RLS (restless legs syndrome), Sleep apnea, Smoker, and Substance abuse (HCC).   The patient had the first sleep study in the year 2007 . Sleep relevant medical history:  Recovering alcoholic a with sleep walking in childhood,   no tonsillectomy, had cervical spine surgery/ anterior access, Dr Botero.    Family medical /sleep history:  father had RLS, alcoholic, he passed I  12-2019, brother with OSA, mother with Insomnia..    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  Family status is single  The patient currently works daytime.  Her dog present. Tobacco use- no.  ETOH use - not in years. , Caffeine intake in form of Coffee( 2 cups ) . Regular exercise in form of walking her dog     Sleep habits are   as follows: The patient's dinner time is between 6-7 PM. The patient goes to bed at 10.15 PM and continues to sleep for several hours -wakes from gasping if she is in supine position-  No RLS.  The preferred sleep position is laterally, with the support of several  pillows.  Dreams are reportedly frequent.  6.15 AM is the usual rise time. The patient wakes up with an alarm.   She reports not feeling refreshed or restored in AM, with symptoms such as dry mouth , morning headaches, soreness and achiness, and residual fatigue.  Naps are taken on weekends frequently, lasting from 2-2.5 hours and are morerefreshing than nocturnal sleep.  These don't interfere with nocturnal sleep.      I reviewed the patient's most recent lab tests through her primary care provider Gloria [name].  Hb A1c has been reduced to 5.6, her TSH is normal T4 is normal lipid panel shows hyperlipidemia hypercholesterolemia, INR was 1.0 and ALT AST were normal.   REVIEW OF SYSTEMS: Out of a complete 14 system review of symptoms, the patient complains only of the following symptoms, and all other reviewed systems are negative.  FSS ESS  ALLERGIES: Allergies  Allergen Reactions   Benadryl [Diphenhydramine Hcl] Hives   Ropinirole Other (See Comments)   Levofloxacin Other (See Comments)   Aspirin    Codeine    Doxycycline    Metformin And Related     Blurred vision   Nuvigil [Armodafinil]     migraines    Requip [Ropinirole Hcl]     Increased headache   Semaglutide Other (See Comments)    Rybelsus caused depression and fibromyalgia flair.   Tramadol     Other reaction(s): Other (See Comments)   Wellbutrin [Bupropion]    Zolpidem     HOME MEDICATIONS: Outpatient Medications Prior to Visit  Medication Sig Dispense Refill   B Complex Vitamins (B COMPLEX PO) Take 1 tablet by mouth daily.     blood glucose meter kit and supplies KIT Dispense based on patient and insurance preference. Use up to four times daily as directed. (FOR ICD-9 250.00, 250.01). 1 each 0   Blood Glucose Monitoring Suppl (ONETOUCH VERIO FLEX SYSTEM) w/Device KIT Use to check Blood sugars twice daily before meals 1 kit 0   Brexpiprazole (REXULTI) 4 MG TABS Take by mouth.     Calcium Carbonate-Vitamin D 600-400 MG-UNIT tablet Take 1 tablet by mouth 2 (two) times daily.      clotrimazole-betamethasone (LOTRISONE) cream Apply 1 application topically 2 (two) times daily. For 10 days maximum 45 g 1   Coenzyme Q10 (COQ10) 100 MG CAPS Take 100 mg by mouth daily.      diclofenac (VOLTAREN) 75 MG EC tablet Take 75 mg by mouth 2 (two) times daily.     estradiol (VIVELLE-DOT) 0.05 MG/24HR patch Place 1 patch (0.05 mg total) onto the skin 2 (two) times a week. 24 patch 4   fluticasone (CUTIVATE) 0.05 % cream Apply topically 2 (two) times daily.     glucose blood (CONTOUR NEXT TEST) test strip Use to test blood glucose twice daily 100 each 5   ibuprofen (ADVIL) 800 MG tablet TAKE 1 TABLET(800 MG) BY MOUTH EVERY 8 HOURS AS NEEDED FOR MODERATE PAIN 30 tablet 3   ketoconazole (NIZORAL) 2 % cream Apply 1 application topically daily.     Lancets (ONETOUCH DELICA PLUS [id number]) MISC USE 1  TO CHECK GLUCOSE 4 TIMES DAILY 100 each 0   loratadine (CLARITIN) 10  MG tablet Take 10 mg by mouth daily.     LORazepam (ATIVAN) 0.5 MG tablet Take 0.5 mg by mouth daily.     Magnesium 500 MG CAPS Take by mouth.     metroNIDAZOLE (METROCREAM) 0.75 % cream daily     Multiple Vitamins-Minerals (MULTIVITAMIN PO) Take 1 tablet by mouth daily.      OMEGA 3-6-9 FATTY ACIDS PO Take 500 mg by mouth daily.     oxcarbazepine (TRILEPTAL) 600 MG tablet Take 600 mg by mouth 2 (two) times daily.     pantoprazole (PROTONIX) 40 MG tablet TAKE 1 TABLET BY MOUTH DAILY (Patient taking differently: as needed.) 90 tablet 0   rizatriptan (MAXALT) 10 MG tablet Take 1 tablet (10 mg total) by mouth as needed for migraine. 10 tablet 5   Semaglutide, 2 MG/DOSE, 8 MG/3ML SOPN Inject 2 mg as directed once a week. 3 mL 0   thiamine (VITAMIN B-1) 50 MG tablet Take 50 mg by mouth daily.     vitamin C (ASCORBIC ACID) 500 MG tablet Take 500 mg by mouth daily.     VITAMIN E PO Take 900 mg by mouth daily.     No facility-administered medications prior to visit.    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   DDD (degenerative disc disease), lumbar    Depression    Diabetes (HCC)    Diverticulosis    Drug use    Elevated LFTs    Fibromyalgia    GERD (gastroesophageal reflux disease)    History of ETOH abuse    Hyperlipidemia    Hyperplastic colon polyp    Internal hemorrhoids    Migraines    NAFLD (nonalcoholic fatty liver disease)    Obese    Obstructive sleep apnea 02/25/2015   not diagnosed per pt 04-03-20   Osteoarthritis    lower back- DDD   Periodic limb movement disorder 02/25/2014   Pre-diabetes    pt taking metformin   RLS (restless legs syndrome)    Sleep apnea    Smoker    Snoring    Substance abuse (HCC)    Swallowing difficulty    Vitamin B12 deficiency    Vitamin D deficiency     PAST SURGICAL HISTORY: Past Surgical History:  Procedure Laterality Date   CERVICAL FUSION     COLONOSCOPY     CYSTOSCOPY  06/06/2013   Procedure: CYSTOSCOPY;  Surgeon: Juan H Fernandez, MD;  Location: [facility];  Service: Gynecology;;   EXCISION METACARPAL MASS Right 09/03/2021   Procedure: EXCISION METACARPAL MASS RIGHT SMALL FINGER;  Surgeon: Kuzma, Gary, MD;  Location: [facility];  Service: Orthopedics;  Laterality: Right;   INTRAUTERINE DEVICE INSERTION  08/09/2008   PARAGUARD- removed   kidney surgery  1995   abcess   LAPAROSCOPY N/A 06/06/2013   Procedure: LAPAROSCOPY DIAGNOSTIC;  Surgeon: Juan H Fernandez, MD;  Location: [facility];  Service: Gynecology;  Laterality: N/A;   LAPAROTOMY  06/06/2013   Procedure: EXPLORATORY LAPAROTOMY;    Surgeon: Juan H Fernandez, MD;  Location: [facility];  Service: Gynecology;;   LIVER BIOPSY     radial tunnel Bilateral 2005   VAGINAL HYSTERECTOMY Left 06/06/2013   Procedure: Vaginal Hysterectomy with Patiial Right Salpingectomy;  Surgeon: Juan H Fernandez, MD;  Location: [facility];  Service: Gynecology;   Laterality: Left;    FAMILY HISTORY: Family History  Problem Relation Age of Onset   Cancer Mother    Breast cancer Mother    Depression Mother    Anxiety disorder Mother    Obesity Mother    Sleep apnea Mother    Obesity Father    Anxiety disorder Father    Cancer Father    Hyperlipidemia Father    Colon polyps Father    Diabetes Father    Cirrhosis Father    Liver cancer Father    Liver disease Father    Alcoholism Father    Depression Father    Alcohol abuse Father    Sleep apnea Father    Irritable bowel syndrome Sister    Sleep apnea Brother    Breast cancer Maternal Aunt    Alcoholism Maternal Aunt    Alcoholism Maternal Grandmother    Alcoholism Maternal Grandfather    Breast cancer Paternal Grandmother    Colon cancer Paternal Grandmother    Esophageal cancer Paternal Grandfather    Stomach cancer Paternal Grandfather    Alcoholism Paternal Grandfather    Rectal cancer Neg Hx     SOCIAL HISTORY: Social History   Socioeconomic History   Marital status: Divorced    Spouse name: Not on file   Number of children: 1   Years of education: 13   Highest education level: Associate degree: academic program  Occupational History   Occupation: ACCOUNTING   Occupation: Accounting Clerk  Tobacco Use   Smoking status: Former    Packs/day: 1.00    Types: Cigarettes    Quit date: 01/26/2012    Years since quitting: 10.5   Smokeless tobacco: Never  Vaping Use   Vaping Use: Never used  Substance and Sexual Activity   Alcohol use: No    Alcohol/week: 0.0 standard drinks of alcohol    Comment: History of alcohol abuse   Drug use: No   Sexual activity: Not Currently    Birth control/protection: Surgical    Comment: hysterectomy  Other Topics Concern   Not on file  Social History Narrative   Patient lives at home and works full time.   Education some college.   Right handed.   Caffeine: 2 cups daily.   Social Determinants of Health   Financial Resource  Strain: Low Risk  (07/21/2022)   Overall Financial Resource Strain (CARDIA)    Difficulty of Paying Living Expenses: Not very hard  Food Insecurity: No Food Insecurity (07/21/2022)   Hunger Vital Sign    Worried About Running Out of Food in the Last Year: Never true    Ran Out of Food in the Last Year: Never true  Transportation Needs: No Transportation Needs (07/21/2022)   PRAPARE - Transportation    Lack of Transportation (Medical): No    Lack of Transportation (Non-Medical): No  Physical Activity: Insufficiently Active (07/21/2022)   Exercise Vital Sign    Days of Exercise per Week: 2 days    Minutes of Exercise per Session: 20 min  Stress: No Stress Concern Present (07/21/2022)   [employer] - Occupational Stress Questionnaire    Feeling of Stress : Only   a little  Social Connections: Moderately Integrated (07/21/2022)   Social Connection and Isolation Panel [NHANES]    Frequency of Communication with Friends and Family: More than three times a week    Frequency of Social Gatherings with Friends and Family: More than three times a week    Attends Religious Services: More than 4 times per year    Active Member of Clubs or Organizations: Yes    Attends Club or Organization Meetings: More than 4 times per year    Marital Status: Divorced  Intimate Partner Violence: Not on file      PHYSICAL EXAM  Vitals:   08/11/22 0830  BP: (!) 141/89  Pulse: 78  Weight: 253 lb (114.8 kg)  Height: 5' 5" (1.651 m)   Body mass index is 42.1 kg/m.  Generalized: Well developed, in no acute distress  Chest: Lungs clear to auscultation bilaterally  Neurological examination  Mentation: Alert oriented to time, place, history taking. Follows all commands speech and language fluent Cranial nerve II-XII: Extraocular movements were full, visual field were full on confrontational test Head turning and shoulder shrug  were normal and symmetric. Gait and station: Gait is  normal.    DIAGNOSTIC DATA (LABS, IMAGING, TESTING) - I reviewed patient records, labs, notes, testing and imaging myself where available.  Lab Results  Component Value Date   WBC 10.4 08/07/2021   HGB 13.2 08/07/2021   HCT 40.2 08/07/2021   MCV 89 08/07/2021   PLT 284 08/07/2021      Component Value Date/Time   NA 133 (L) 07/14/2022 0934   K 4.3 07/14/2022 0934   CL 97 07/14/2022 0934   CO2 23 07/14/2022 0934   GLUCOSE 107 (H) 07/14/2022 0934   GLUCOSE 139 (H) 09/19/2020 0847   BUN 7 07/14/2022 0934   CREATININE 0.58 07/14/2022 0934   CREATININE 0.61 09/19/2020 0847   CALCIUM 9.1 07/14/2022 0934   PROT 6.8 07/14/2022 0934   ALBUMIN 4.2 07/14/2022 0934   AST 19 07/14/2022 0934   ALT 22 07/14/2022 0934   ALKPHOS 79 07/14/2022 0934   BILITOT <0.2 07/14/2022 0934   GFRNONAA 106 09/19/2020 0847   GFRAA 123 09/19/2020 0847   Lab Results  Component Value Date   CHOL 216 (H) 07/14/2022   HDL 44 07/14/2022   LDLCALC 139 (H) 07/14/2022   LDLDIRECT 145.0 07/18/2019   TRIG 185 (H) 07/14/2022   CHOLHDL 6.1 (H) 09/19/2020   Lab Results  Component Value Date   HGBA1C 5.6 07/14/2022   Lab Results  Component Value Date   VITAMINB12 608 08/07/2021   Lab Results  Component Value Date   TSH 2.860 08/07/2021      ASSESSMENT AND PLAN 51 y.o. year old female  has a past medical history of Alcohol abuse, Allergy, Anemia, Anxiety, Arthritis, Bilateral swelling of feet, Bulging disc, Chronic active hepatitis (HCC), Chronic back pain (02/25/2015), Chronic fatigue syndrome, Chronic headaches, Chronic kidney disease, DDD (degenerative disc disease), lumbar, Depression, Diabetes (HCC), Diverticulosis, Drug use, Elevated LFTs, Fibromyalgia, GERD (gastroesophageal reflux disease), History of ETOH abuse, Hyperlipidemia, Hyperplastic colon polyp, Internal hemorrhoids, Migraines, NAFLD (nonalcoholic fatty liver disease), Obese, Obstructive sleep apnea (02/25/2015), Osteoarthritis, Periodic  limb movement disorder (02/25/2014), Pre-diabetes, RLS (restless legs syndrome), Sleep apnea, Smoker, Snoring, Substance abuse (HCC), Swallowing difficulty, Vitamin B12 deficiency, and Vitamin D deficiency. here with:  OSA on CPAP  - CPAP compliance excellent - Good treatment of AHI  - Encourage patient to use CPAP nightly and > 4 hours each   night -Due to the patient having bleeding, tooth sensitivity and issues with her tooth enamel we will switch her to a nasal mask, pillows or the DreamWear mask.  The only concern is that the patient may not be able to tolerate this due to chronic nasal congestion.  She is interested in a referral to ENT to see if her congestion can be treated. - F/U in 6 months or sooner if needed    , MSN, NP-C 08/11/2022, 8:48 AM [facility] [address], [address] [place],  [address] [phone]  

## 2022-08-12 ENCOUNTER — Other Ambulatory Visit: Payer: Self-pay | Admitting: Family Medicine

## 2022-08-12 DIAGNOSIS — Z1231 Encounter for screening mammogram for malignant neoplasm of breast: Secondary | ICD-10-CM

## 2022-08-16 NOTE — Progress Notes (Signed)
New, Willodean Rosenthal, RN; Darlina Guys; Burgin, Clovis Riley Received, Thank you!      Previous Messages    ----- Message -----  From: Brandon Melnick, RN  Sent: 08/12/2022  10:56 AM EDT  To: Darlina Guys; Miquel Dunn; Stephannie Peters; *  Subject: mask refitting, needs nasal mask or pillow o*   Order in Epic.   Gisele R. Deguia  Female, 51 y.o., 1971-05-18  MRN:  [id number]   [facility]

## 2022-08-17 ENCOUNTER — Telehealth: Payer: Self-pay | Admitting: Adult Health

## 2022-08-17 NOTE — Telephone Encounter (Signed)
Referrral for ENT sent to [facility]. Phone: [phone], Fax: [phone].

## 2022-08-25 ENCOUNTER — Ambulatory Visit (INDEPENDENT_AMBULATORY_CARE_PROVIDER_SITE_OTHER): Payer: 59 | Admitting: Family Medicine

## 2022-08-25 VITALS — BP 138/84 | HR 63 | Temp 98.4°F | Wt 253.6 lb

## 2022-08-25 DIAGNOSIS — I10 Essential (primary) hypertension: Secondary | ICD-10-CM | POA: Diagnosis not present

## 2022-08-25 NOTE — Progress Notes (Signed)
Subjective:    Patient ID: Gloria [name], female    DOB: Feb 06, 1971, 51 y.o.   MRN: [id number]  Chief Complaint  Patient presents with   Follow-up    138/80 was her main reading     HPI Patient was seen today for f/u on BP.  Pt has her home bp cuff with her this visit.  BP on pt's cuff 141/83, in office reading 138/84.   Readings at home typically better.  Pt working on diet.  Seen by [facility] management.    Notes resolution of rash previously seen for in clinic.  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   DDD (degenerative disc disease), lumbar    Depression    Diabetes ([place])    Diverticulosis    Drug use    Elevated LFTs    Fibromyalgia    GERD (gastroesophageal reflux disease)    History of ETOH abuse    Hyperlipidemia    Hyperplastic colon polyp    Internal hemorrhoids    Migraines    NAFLD (nonalcoholic fatty liver disease)    Obese    Obstructive sleep apnea 02/25/2015   not diagnosed per pt 04-03-20   Osteoarthritis    lower back- DDD   Periodic limb movement disorder 02/25/2014   Pre-diabetes    pt taking metformin   RLS (restless legs syndrome)    Sleep apnea    Smoker    Snoring    Substance abuse (HCC)    Swallowing difficulty    Vitamin B12 deficiency    Vitamin D deficiency     Allergies  Allergen Reactions   Benadryl [Diphenhydramine Hcl] Hives   Ropinirole Other (See Comments)   Levofloxacin Other (See Comments)   Aspirin    Codeine    Doxycycline    Metformin And Related     Blurred vision   Nuvigil [Armodafinil]     migraines    Requip [Ropinirole Hcl]     Increased headache   Semaglutide Other (See Comments)    Rybelsus  caused depression and fibromyalgia flair.   Tramadol     Other reaction(s): Other (See Comments)   Wellbutrin [Bupropion]    Zolpidem     ROS General: Denies fever, chills, night sweats, changes in weight, changes in appetite HEENT: Denies headaches, ear pain, changes in vision, rhinorrhea, sore throat CV: Denies CP, palpitations, SOB, orthopnea Pulm: Denies SOB, cough, wheezing GI: Denies abdominal pain, nausea, vomiting, diarrhea, constipation GU: Denies dysuria, hematuria, frequency, vaginal discharge Msk: Denies muscle cramps, joint pains Neuro: Denies weakness, numbness, tingling Skin: Denies rashes, bruising Psych: Denies depression, anxiety, hallucinations     Objective:    Blood pressure 138/84, pulse 63, temperature 98.4 F (36.9 C), temperature source Oral, weight 253 lb 9.6 oz (115 kg), last menstrual period 04/25/2013, SpO2 96 %.   Gen. Pleasant, well-nourished, in no distress, normal affect   HEENT: [place]/AT, face symmetric, conjunctiva clear, no scleral icterus, PERRLA, EOMI, nares patent without drainage Lungs: no accessory muscle use, CTAB, no wheezes or  rales Cardiovascular: RRR, no m/r/g, no peripheral edema Musculoskeletal: No deformities, no cyanosis or clubbing, normal tone Neuro:  A&Ox3, CN II-XII intact, normal gait Skin:  Warm, no lesions/ rash   Wt Readings from Last 3 Encounters:  08/25/22 253 lb 9.6 oz (115 kg)  08/11/22 253 lb (114.8 kg)  08/05/22 250 lb (113.4 kg)    Lab Results  Component Value Date   WBC 10.4 08/07/2021   HGB 13.2 08/07/2021   HCT 40.2 08/07/2021   PLT 284 08/07/2021   GLUCOSE 107 (H) 07/14/2022   CHOL 216 (H) 07/14/2022   TRIG 185 (H) 07/14/2022   HDL 44 07/14/2022   LDLDIRECT 145.0 07/18/2019   LDLCALC 139 (H) 07/14/2022   ALT 22 07/14/2022   AST 19 07/14/2022   NA 133 (L) 07/14/2022   K 4.3 07/14/2022   CL 97 07/14/2022   CREATININE 0.58 07/14/2022   BUN 7 07/14/2022   CO2 23 07/14/2022   TSH 2.860 08/07/2021    INR 1.0 07/21/2020   HGBA1C 5.6 07/14/2022   MICROALBUR <0.7 03/13/2020    Assessment/Plan:  Essential hypertension -Improving -Recheck BP -Continue lifestyle modifications.  Patient seen by weight management on Ozempic.  Consistent take improved BP with continued weight loss. -For continued elevation consistently 140/90 start medication -Continue to monitor BP at home  F/u in 3-4 months  Grier Mitts, MD

## 2022-08-26 ENCOUNTER — Ambulatory Visit (INDEPENDENT_AMBULATORY_CARE_PROVIDER_SITE_OTHER): Payer: 59 | Admitting: Family Medicine

## 2022-08-26 ENCOUNTER — Encounter (INDEPENDENT_AMBULATORY_CARE_PROVIDER_SITE_OTHER): Payer: Self-pay | Admitting: Family Medicine

## 2022-08-26 VITALS — BP 138/74 | HR 65 | Temp 97.8°F | Ht 65.0 in | Wt 248.0 lb

## 2022-08-26 DIAGNOSIS — Z6841 Body Mass Index (BMI) 40.0 and over, adult: Secondary | ICD-10-CM

## 2022-08-26 DIAGNOSIS — E1169 Type 2 diabetes mellitus with other specified complication: Secondary | ICD-10-CM | POA: Diagnosis not present

## 2022-08-26 DIAGNOSIS — E669 Obesity, unspecified: Secondary | ICD-10-CM | POA: Diagnosis not present

## 2022-08-26 DIAGNOSIS — Z7985 Long-term (current) use of injectable non-insulin antidiabetic drugs: Secondary | ICD-10-CM

## 2022-08-26 DIAGNOSIS — I10 Essential (primary) hypertension: Secondary | ICD-10-CM

## 2022-08-26 MED ORDER — SEMAGLUTIDE (2 MG/DOSE) 8 MG/3ML ~~LOC~~ SOPN: 2.0000 mg | PEN_INJECTOR | SUBCUTANEOUS | 0 refills | Status: DC

## 2022-08-31 NOTE — Progress Notes (Signed)
Chief Complaint:   OBESITY Gloria [name] is here to discuss her progress with her obesity treatment plan along with follow-up of her obesity related diagnoses. [name] is on the Category 2 Plan and states she is following her eating plan approximately 50% of the time. [name] states she is walking for 30 minutes 3 times per week.  Today's visit was #: 22 Starting weight: 274 lbs Starting date: 08/07/2021 Today's weight: 248 lbs Today's date: 08/26/2022 Total lbs lost to date: 26 Total lbs lost since last in-office visit: 2  Interim History: [name] continues to lose weight.  She especially does well with breakfast and lunch.  She is working on picking a lower sodium diet.  She is still struggling with dinner, but she is working on easier to plan foods.  Subjective:   1. Type 2 diabetes mellitus with other specified complication, unspecified whether long term insulin use (HCC) [name] is stable on her medications, with no nausea or vomiting.  She is doing well with weight loss.  2. Essential hypertension [name]'s blood pressure is better controlled when she checks it at home.  She continues to work on decreasing sodium.  Assessment/Plan:   1. Type 2 diabetes mellitus with other specified complication, unspecified whether long term insulin use (HCC) [name] will continue her diet and Ozempic, and we will refill Ozempic 2 mg once weekly for 1 month.  - Semaglutide, 2 MG/DOSE, 8 MG/3ML SOPN; Inject 2 mg as directed once a week.  Dispense: 3 mL; Refill: 0  2. Essential hypertension [name] will continue as is, and with additional weight loss her blood pressure may continue to improve.  3. Obesity, Current BMI 41.3 [name] is currently in the action stage of change. As such, her goal is to continue with weight loss efforts. She has agreed to the Category 2 Plan.   Exercise goals: As is.   Behavioral modification strategies: increasing lean protein intake and meal planning and cooking  strategies.  [name] has agreed to follow-up with our clinic in 3 to 4 weeks. She was informed of the importance of frequent follow-up visits to maximize her success with intensive lifestyle modifications for her multiple health conditions.   Objective:   Blood pressure 138/74, pulse 65, temperature 97.8 F (36.6 C), height 5' 5"  (1.651 m), weight 248 lb (112.5 kg), last menstrual period 04/25/2013, SpO2 98 %. Body mass index is 41.27 kg/m.  General: Cooperative, alert, well developed, in no acute distress. HEENT: Conjunctivae and lids unremarkable. Cardiovascular: Regular rhythm.  Lungs: Normal work of breathing. Neurologic: No focal deficits.   Lab Results  Component Value Date   CREATININE 0.58 07/14/2022   BUN 7 07/14/2022   NA 133 (L) 07/14/2022   K 4.3 07/14/2022   CL 97 07/14/2022   CO2 23 07/14/2022   Lab Results  Component Value Date   ALT 22 07/14/2022   AST 19 07/14/2022   ALKPHOS 79 07/14/2022   BILITOT <0.2 07/14/2022   Lab Results  Component Value Date   HGBA1C 5.6 07/14/2022   HGBA1C 5.6 12/28/2021   HGBA1C 6.2 (H) 08/07/2021   HGBA1C 6.3 (A) 05/20/2021   HGBA1C 5.7 (H) 09/19/2020   Lab Results  Component Value Date   INSULIN 41.2 (H) 07/14/2022   INSULIN 30.5 (H) 12/28/2021   INSULIN 29.5 (H) 08/07/2021   Lab Results  Component Value Date   TSH 2.860 08/07/2021   Lab Results  Component Value Date   CHOL 216 (H) 07/14/2022   HDL  44 07/14/2022   LDLCALC 139 (H) 07/14/2022   LDLDIRECT 145.0 07/18/2019   TRIG 185 (H) 07/14/2022   CHOLHDL 6.1 (H) 09/19/2020   Lab Results  Component Value Date   VD25OH 64.0 07/14/2022   VD25OH 60.0 12/28/2021   VD25OH 45.2 08/07/2021   Lab Results  Component Value Date   WBC 10.4 08/07/2021   HGB 13.2 08/07/2021   HCT 40.2 08/07/2021   MCV 89 08/07/2021   PLT 284 08/07/2021   Lab Results  Component Value Date   IRON 54 10/06/2017   TIBC 304 10/06/2017   FERRITIN 151 (H) 10/06/2017   Attestation  Statements:   Reviewed by clinician on day of visit: allergies, medications, problem list, medical history, surgical history, family history, social history, and previous encounter notes.   I, Trixie Dredge, am acting as transcriptionist for Dennard Nip, MD.  I have reviewed the above documentation for accuracy and completeness, and I agree with the above. -  Dennard Nip, MD

## 2022-09-06 ENCOUNTER — Encounter (INDEPENDENT_AMBULATORY_CARE_PROVIDER_SITE_OTHER): Payer: Self-pay | Admitting: Family Medicine

## 2022-09-06 ENCOUNTER — Other Ambulatory Visit (HOSPITAL_COMMUNITY): Payer: Self-pay

## 2022-09-06 DIAGNOSIS — E1169 Type 2 diabetes mellitus with other specified complication: Secondary | ICD-10-CM

## 2022-09-07 ENCOUNTER — Other Ambulatory Visit (HOSPITAL_COMMUNITY): Payer: Self-pay

## 2022-09-07 ENCOUNTER — Ambulatory Visit: Payer: 59

## 2022-09-07 MED ORDER — TIRZEPATIDE 12.5 MG/0.5ML ~~LOC~~ SOAJ
12.5000 mg | SUBCUTANEOUS | 0 refills | Status: DC
  Filled 2022-09-07: qty 2, 28d supply, fill #0

## 2022-09-07 NOTE — Telephone Encounter (Signed)
Please send in Mounjaro 12.5 mg qweek [facility] and let Kemyra know where we sent it

## 2022-09-08 ENCOUNTER — Other Ambulatory Visit (HOSPITAL_COMMUNITY): Payer: Self-pay

## 2022-09-09 ENCOUNTER — Encounter (INDEPENDENT_AMBULATORY_CARE_PROVIDER_SITE_OTHER): Payer: Self-pay

## 2022-09-09 ENCOUNTER — Other Ambulatory Visit (HOSPITAL_COMMUNITY): Payer: Self-pay

## 2022-09-10 ENCOUNTER — Ambulatory Visit (INDEPENDENT_AMBULATORY_CARE_PROVIDER_SITE_OTHER): Payer: 59 | Admitting: Family Medicine

## 2022-09-10 ENCOUNTER — Encounter: Payer: Self-pay | Admitting: Family Medicine

## 2022-09-10 ENCOUNTER — Other Ambulatory Visit: Payer: Self-pay | Admitting: *Deleted

## 2022-09-10 VITALS — BP 158/75 | HR 76 | Temp 98.4°F | Wt 251.0 lb

## 2022-09-10 DIAGNOSIS — R030 Elevated blood-pressure reading, without diagnosis of hypertension: Secondary | ICD-10-CM

## 2022-09-10 DIAGNOSIS — E1169 Type 2 diabetes mellitus with other specified complication: Secondary | ICD-10-CM | POA: Diagnosis not present

## 2022-09-10 DIAGNOSIS — R051 Acute cough: Secondary | ICD-10-CM

## 2022-09-10 LAB — POC COVID19 BINAXNOW: SARS Coronavirus 2 Ag: NEGATIVE

## 2022-09-10 MED ORDER — AMOXICILLIN-POT CLAVULANATE 875-125 MG PO TABS: 1.0000 | ORAL_TABLET | Freq: Two times a day (BID) | ORAL | 0 refills | Status: DC

## 2022-09-10 MED ORDER — BENZONATATE 200 MG PO CAPS: 200.0000 mg | ORAL_CAPSULE | Freq: Two times a day (BID) | ORAL | 0 refills | Status: DC | PRN

## 2022-09-10 MED ORDER — AZELASTINE HCL 0.1 % NA SOLN: 2.0000 | Freq: Two times a day (BID) | NASAL | 12 refills | Status: DC

## 2022-09-10 NOTE — Progress Notes (Signed)
Order(s) created erroneously. Erroneous order ID: [id number]  Order canceled by: Mike Craze  Order cancel date/time: 09/10/2022 12:07 PM

## 2022-09-10 NOTE — Patient Instructions (Signed)
It was very nice to see you today!  You have a sinus infection.  Please start the Astelin and Augmentin.  Use Tessalon as needed for cough.  Make sure that you are getting plenty of fluids.  Let us know if not improving by next week.  Take care, Dr Jerline Pain  PLEASE NOTE:  If you had any lab tests please let us know if you have not heard back within a few days. You may see your results on mychart before we have a chance to review them but we will give you a call once they are reviewed by [place]. If we ordered any referrals today, please let us know if you have not heard from their office within the next week.   Please try these tips to maintain a healthy lifestyle:  Eat at least 3 REAL meals and 1-2 snacks per day.  Aim for no more than 5 hours between eating.  If you eat breakfast, please do so within one hour of getting up.   Each meal should contain half fruits/vegetables, one quarter protein, and one quarter carbs (no bigger than a computer mouse)  Cut down on sweet beverages. This includes juice, soda, and sweet tea.   Drink at least 1 glass of water with each meal and aim for at least 8 glasses per day  Exercise at least 150 minutes every week.

## 2022-09-10 NOTE — Progress Notes (Signed)
   Gloria [name] is a 51 y.o. female who presents today for an office visit.  Assessment/Plan:  New/Acute Problems: Sinusitis  No red flags.  COVID test here negative.  Given length of symptoms will start Augmentin.  Also start Astelin.  Start Tessalon for cough.  Encouraged hydration.  She can use over-the-counter meds as needed.  We discussed reasons to return to care.  Follow-up as needed.  Chronic Problems Addressed Today: [place] Syndrome  Elevated to 158/75 today.  Typically well controlled at home.  We will continue home monitoring.  T2DM Last A1c well controlled 5.6.  Was recently switched to [facility] by weight management clinic.  Discussed with patient that should not interact with any of her other medications.    Subjective:  HPI:  Patient here with cough for the last several days. She did have some fatigue last week as well. Started having more facial congestion and nose congestion. Had some subjective fevers. More stuffiness. Seems to be worsening the last few days. Tylenol has helped.  She had a coworker that has been sick for several weeks. She took a couple of home covid tests which were negative. No shortness of breath. Better with rest and worse with motion. Some throat [name].        Objective:  Physical Exam: BP (!) 158/75   Pulse 76   Temp 98.4 F (36.9 C) (Temporal)   Wt 251 lb (113.9 kg)   LMP 04/25/2013   SpO2 97%   BMI 41.77 kg/m   Gen: No acute distress, resting comfortably HEENT: TMs clear.  Nasal mucosa with thick discharge and edema.  OP clear. CV: Regular rate and rhythm with no murmurs appreciated Pulm: Normal work of breathing, clear to auscultation bilaterally with no crackles, wheezes, or rhonchi Neuro: Grossly normal, moves all extremities Psych: Normal affect and thought content      Gloria [name] M. Jerline Pain, MD 09/10/2022 9:41 AM

## 2022-09-12 ENCOUNTER — Encounter: Payer: Self-pay | Admitting: Family Medicine

## 2022-09-13 ENCOUNTER — Telehealth (INDEPENDENT_AMBULATORY_CARE_PROVIDER_SITE_OTHER): Payer: Self-pay | Admitting: Family Medicine

## 2022-09-13 NOTE — Telephone Encounter (Signed)
Dr. Leafy Ro - Prior authorization approved for [facility]. Effective: 09/08/2022 - 09/09/2023. Patient sent approval message via mychart.

## 2022-09-16 ENCOUNTER — Encounter (INDEPENDENT_AMBULATORY_CARE_PROVIDER_SITE_OTHER): Payer: Self-pay | Admitting: Family Medicine

## 2022-09-16 ENCOUNTER — Other Ambulatory Visit (HOSPITAL_COMMUNITY): Payer: Self-pay

## 2022-09-16 ENCOUNTER — Ambulatory Visit (INDEPENDENT_AMBULATORY_CARE_PROVIDER_SITE_OTHER): Payer: 59 | Admitting: Family Medicine

## 2022-09-16 VITALS — BP 124/73 | HR 72 | Temp 97.9°F | Ht 65.0 in | Wt 249.0 lb

## 2022-09-16 DIAGNOSIS — G8929 Other chronic pain: Secondary | ICD-10-CM | POA: Diagnosis not present

## 2022-09-16 DIAGNOSIS — E1169 Type 2 diabetes mellitus with other specified complication: Secondary | ICD-10-CM | POA: Diagnosis not present

## 2022-09-16 DIAGNOSIS — Z7985 Long-term (current) use of injectable non-insulin antidiabetic drugs: Secondary | ICD-10-CM

## 2022-09-16 DIAGNOSIS — E669 Obesity, unspecified: Secondary | ICD-10-CM

## 2022-09-16 DIAGNOSIS — M545 Low back pain, unspecified: Secondary | ICD-10-CM

## 2022-09-16 DIAGNOSIS — Z6841 Body Mass Index (BMI) 40.0 and over, adult: Secondary | ICD-10-CM

## 2022-09-16 DIAGNOSIS — I10 Essential (primary) hypertension: Secondary | ICD-10-CM

## 2022-09-16 MED ORDER — TIRZEPATIDE 15 MG/0.5ML ~~LOC~~ SOAJ
15.0000 mg | SUBCUTANEOUS | 0 refills | Status: DC
  Filled 2022-09-16: qty 2, 28d supply, fill #0

## 2022-09-20 ENCOUNTER — Other Ambulatory Visit: Payer: Self-pay | Admitting: Family Medicine

## 2022-09-21 NOTE — Progress Notes (Signed)
Chief Complaint:   OBESITY Gloria [name] is here to discuss her progress with her obesity treatment plan along with follow-up of her obesity related diagnoses. Gloria [name] is on the Category 2 Plan and states she is following her eating plan approximately 85% of the time. Gloria [name] states she is doing 0 minutes 0 times per week.  Today's visit was #: 23 Starting weight: 274 lbs Starting date: 08/07/2021 Today's weight: 249 lbs Today's date: 09/16/2022 Total lbs lost to date: 25 Total lbs lost since last in-office visit: 0  Interim History: Gloria [name] did note increased hunger after changing of GLP-1.  She tried to add in more protein to help control her hunger.  Subjective:   1. Type 2 diabetes mellitus with other specified complication, unspecified whether long term insulin use (HCC) Gloria [name] was on Ozempic 2 mg once weekly but she is now taking Mounjaro 12.5 mg once weekly with no side effects.  She does note some increased hunger, and may be some looser stools.  2. Essential hypertension Gloria [name]'s blood pressure is good today.  She wonders if the increase in her blood pressure was related to Ozempic.  3. Chronic bilateral low back pain without sciatica Gloria [name] would like to begin physical therapy to address lower back pain.  Assessment/Plan:   1. Type 2 diabetes mellitus with other specified complication, unspecified whether long term insulin use ([place]) Gloria [name] agreed to increase Mounjaro to 15 mg once weekly, and we will refill for 1 month.  She will continue with her eating plan and exercise.  - tirzepatide (MOUNJARO) 15 MG/0.5ML Pen; Inject 15 mg into the skin once a week.  Dispense: 6 mL; Refill: 0  2. Essential hypertension Gloria [name] will continue to monitor her blood pressure for trends, and she will continue her eating plan and exercise.  3. Chronic bilateral low back pain without sciatica Gloria [name] was referred to [facility] for physical therapy.  - Ambulatory referral to Physical  Therapy  4. Obesity, Current BMI 41.5 Gloria [name] is currently in the action stage of change. As such, her goal is to continue with weight loss efforts. She has agreed to the Category 2 Plan.   Exercise goals: Water aerobics if she can tolerate.   Behavioral modification strategies: increasing lean protein intake, decreasing simple carbohydrates, holiday eating strategies , and planning for success.  Gloria [name] has agreed to follow-up with our clinic in 2 to 3 weeks. She was informed of the importance of frequent follow-up visits to maximize her success with intensive lifestyle modifications for her multiple health conditions.   Objective:   Blood pressure 124/73, pulse 72, temperature 97.9 F (36.6 C), height 5' 5"  (1.651 m), weight 249 lb (112.9 kg), last menstrual period 04/25/2013, SpO2 99 %. Body mass index is 41.44 kg/m.  General: Cooperative, alert, well developed, in no acute distress. HEENT: Conjunctivae and lids unremarkable. Cardiovascular: Regular rhythm.  Lungs: Normal work of breathing. Neurologic: No focal deficits.   Lab Results  Component Value Date   CREATININE 0.58 07/14/2022   BUN 7 07/14/2022   NA 133 (L) 07/14/2022   K 4.3 07/14/2022   CL 97 07/14/2022   CO2 23 07/14/2022   Lab Results  Component Value Date   ALT 22 07/14/2022   AST 19 07/14/2022   ALKPHOS 79 07/14/2022   BILITOT <0.2 07/14/2022   Lab Results  Component Value Date   HGBA1C 5.6 07/14/2022   HGBA1C 5.6 12/28/2021   HGBA1C 6.2 (H) 08/07/2021   HGBA1C 6.3 (A) 05/20/2021  HGBA1C 5.7 (H) 09/19/2020   Lab Results  Component Value Date   INSULIN 41.2 (H) 07/14/2022   INSULIN 30.5 (H) 12/28/2021   INSULIN 29.5 (H) 08/07/2021   Lab Results  Component Value Date   TSH 2.860 08/07/2021   Lab Results  Component Value Date   CHOL 216 (H) 07/14/2022   HDL 44 07/14/2022   LDLCALC 139 (H) 07/14/2022   LDLDIRECT 145.0 07/18/2019   TRIG 185 (H) 07/14/2022   CHOLHDL 6.1 (H) 09/19/2020    Lab Results  Component Value Date   VD25OH 64.0 07/14/2022   VD25OH 60.0 12/28/2021   VD25OH 45.2 08/07/2021   Lab Results  Component Value Date   WBC 10.4 08/07/2021   HGB 13.2 08/07/2021   HCT 40.2 08/07/2021   MCV 89 08/07/2021   PLT 284 08/07/2021   Lab Results  Component Value Date   IRON 54 10/06/2017   TIBC 304 10/06/2017   FERRITIN 151 (H) 10/06/2017   Attestation Statements:   Reviewed by clinician on day of visit: allergies, medications, problem list, medical history, surgical history, family history, social history, and previous encounter notes.   I, Trixie Dredge, am acting as transcriptionist for Dennard Nip, MD.  I have reviewed the above documentation for accuracy and completeness, and I agree with the above. -  Dennard Nip, MD

## 2022-09-28 ENCOUNTER — Other Ambulatory Visit: Payer: Self-pay

## 2022-09-28 ENCOUNTER — Encounter: Payer: Self-pay | Admitting: Rehabilitative and Restorative Service Providers"

## 2022-09-28 ENCOUNTER — Ambulatory Visit: Payer: 59 | Attending: Family Medicine | Admitting: Rehabilitative and Restorative Service Providers"

## 2022-09-28 DIAGNOSIS — M545 Low back pain, unspecified: Secondary | ICD-10-CM | POA: Insufficient documentation

## 2022-09-28 DIAGNOSIS — M5459 Other low back pain: Secondary | ICD-10-CM | POA: Diagnosis not present

## 2022-09-28 DIAGNOSIS — R252 Cramp and spasm: Secondary | ICD-10-CM | POA: Diagnosis present

## 2022-09-28 DIAGNOSIS — G8929 Other chronic pain: Secondary | ICD-10-CM | POA: Insufficient documentation

## 2022-09-28 DIAGNOSIS — M6281 Muscle weakness (generalized): Secondary | ICD-10-CM | POA: Insufficient documentation

## 2022-09-28 NOTE — Patient Instructions (Signed)
     [facility] Welcome to [facility]! Here you will find all the information you will need regarding your pool therapy. If you have further questions at any time, please call our office at [phone]. After completing your initial evaluation in the [facility], you may be eligible to complete a portion of your therapy in the pool. A typical week of therapy will consist of 1-2 typical physical therapy visits at our [facility] location and an additional session of therapy in the pool located at the [facility] at [address]. [address], [place] [address]. The phone number at the pool site is [phone]. Please call this number if you are running late or need to cancel your appointment.  Aquatic therapy will be offered on Wednesday mornings and Friday afternoons. Each session will last approximately 45 minutes. All scheduling and payments for aquatic therapy sessions, including cancelations, will be done through our [facility] location.  To be eligible for aquatic therapy, these criteria must be met: You must be able to independently change in the locker room and get to the pool deck. A caregiver can come with you to help if needed. There are benches for a caregiver to sit on next to the pool. No one with an open wound is permitted in the pool.  Handicap parking is available in the front and there is a drop off option for even closer accessibility. Please arrive 15 minutes prior to your appointment to prepare for your pool session. You must sign in at the front desk upon your arrival. Please be sure to attend to any toileting needs prior to entering the pool. Locker rooms for changing are available.  There is direct access to the pool deck from the locker room. You can lock your belongings in a locker or bring them with you poolside. Your therapist will greet you on the pool deck. There may be other swimmers in the pool at the  same time but your session is one-on-one with the therapist.   

## 2022-09-28 NOTE — Therapy (Signed)
OUTPATIENT PHYSICAL THERAPY THORACOLUMBAR EVALUATION   Patient Name: Gloria [name] MRN: [id number] DOB:February 14, 1971, 51 y.o., female Today's Date: 09/28/2022   PT End of Session - 09/28/22 1234     Visit Number 1    Date for PT Re-Evaluation 11/19/22    Authorization Type UHC    PT Start Time 1228    PT Stop Time 1310    PT Time Calculation (min) 42 min    Activity Tolerance Patient tolerated treatment well    Behavior During Therapy WFL for tasks assessed/performed             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   DDD (degenerative disc disease), lumbar    Depression    Diabetes ([place])    Diverticulosis    Drug use    Elevated LFTs    Fibromyalgia    GERD (gastroesophageal reflux disease)    History of ETOH abuse    Hyperlipidemia    Hyperplastic colon polyp    Internal hemorrhoids    Migraines    NAFLD (nonalcoholic fatty liver disease)    Obese    Obstructive sleep apnea 02/25/2015   not diagnosed per pt 04-03-20   Osteoarthritis    lower back- DDD   Periodic limb movement disorder 02/25/2014   Pre-diabetes    pt taking metformin   RLS (restless legs syndrome)    Sleep apnea    Smoker    Snoring    Substance abuse ([place])    Swallowing difficulty    Vitamin B12 deficiency    Vitamin D deficiency    Past Surgical History:  Procedure Laterality Date   CERVICAL FUSION     COLONOSCOPY     CYSTOSCOPY  06/06/2013   Procedure: CYSTOSCOPY;  Surgeon: Terrance Mass, MD;  Location: [facility];  Service: Gynecology;;   EXCISION METACARPAL MASS Right 09/03/2021   Procedure: EXCISION METACARPAL MASS RIGHT SMALL FINGER;  Surgeon: Daryll Brod, MD;   Location: [facility];  Service: Orthopedics;  Laterality: Right;   INTRAUTERINE DEVICE INSERTION  08/09/2008   PARAGUARD- removed   kidney surgery  1995   abcess   LAPAROSCOPY N/A 06/06/2013   Procedure: LAPAROSCOPY DIAGNOSTIC;  Surgeon: Terrance Mass, MD;  Location: [facility];  Service: Gynecology;  Laterality: N/A;   LAPAROTOMY  06/06/2013   Procedure: EXPLORATORY LAPAROTOMY;  Surgeon: Terrance Mass, MD;  Location: [facility];  Service: Gynecology;;   LIVER BIOPSY     radial tunnel Bilateral 2005   VAGINAL HYSTERECTOMY Left 06/06/2013   Procedure: Vaginal Hysterectomy with Patiial Right Salpingectomy;  Surgeon: Terrance Mass, MD;  Location: [facility];  Service: Gynecology;  Laterality: Left;   Patient Active Problem List   Diagnosis Date Noted   White coat syndrome without hypertension 08/05/2022   Mixed hyperlipidemia 07/14/2022   Stress 06/09/2022   Essential hypertension 05/03/2022   At risk for heart disease 05/03/2022   Vitamin D deficiency 04/07/2022   At risk for hypoglycemia 04/07/2022   Non-restorative sleep 10/25/2020  Gasping for breath 09/23/2020   Class 3 severe obesity with serious comorbidity and body mass index (BMI) of 45.0 to 49.9 in adult [facility]) 09/23/2020   Hypersomnia, recurrent 09/23/2020   MDD (recurrent major depressive disorder) in remission ([place]) 09/23/2020   Diabetes mellitus ([place]) 04/06/2019   Chronic back pain 02/25/2015   Periodic limb movement disorder 02/25/2014   Restless legs syndrome 12/15/2013   Cardiac murmur 10/16/2013   Snoring 10/16/2013   Hx of migraine headaches 10/16/2013   Fluid retention 10/12/2013   Weight gain 10/12/2013   Anxiety 64/40/3474   Alcoholic liver disease ([place]) 10/28/2012   Ex-cigarette smoker 10/28/2012   Fibromyalgia 10/27/2012   Depression with anxiety 10/27/2012   GERD (gastroesophageal reflux disease) 10/27/2012   H/O ETOH abuse 10/27/2012    PCP: Billie Ruddy, MD  REFERRING PROVIDER:  Starlyn Skeans, MD  REFERRING DIAG: M54.50,G89.29 (ICD-10-CM) - Chronic bilateral low back pain without sciatica  Rationale for Evaluation and Treatment: Rehabilitation  THERAPY DIAG:  Other low back pain - Plan: PT plan of care cert/re-cert  Muscle weakness (generalized) - Plan: PT plan of care cert/re-cert  Cramp and spasm - Plan: PT plan of care cert/re-cert  ONSET DATE: Approx 14 years ago  SUBJECTIVE:                                                                                                                                                                                           SUBJECTIVE STATEMENT: Pt reports that approx 23 years ago when she had back labor with her son.  Pt states that she had DDD and has a history of alcoholism.  Pt reports that she has been sober for 14 years, but noted increased pain after she was no longer drinking.  Pt states that she follows up with Dr Nelva Bush for back injections with most recent injection in June 2023.  PERTINENT HISTORY:  DM, HTN, Obesity  PAIN:  Are you having pain? Yes: NPRS scale: 4/10 Pain location: low back Pain description: aching Aggravating factors: change in positions Relieving factors: sitting in recliner  PRECAUTIONS: None  WEIGHT BEARING RESTRICTIONS: No  FALLS:  Has patient fallen in last 6 months? 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)  PLOF: Independent, Vocation/Vocational requirements: Prolonged computer work, and Leisure: play cards with friends, AA meetings/related activities  PATIENT GOALS: To be able to begin an exercise routine and get stronger  NEXT MD VISIT: Dr  Beasley November 16  OBJECTIVE:   DIAGNOSTIC FINDINGS:  Has has imaging in the past with Dr Nelva Bush that revealed  DDD  PATIENT SURVEYS:  FOTO 50% (projected 58% by visit 12)  SCREENING FOR RED FLAGS: Bowel or bladder incontinence: No Spinal tumors: No Cauda equina syndrome: No Compression fracture: No Abdominal aneurysm: No  COGNITION: Overall cognitive status: Within functional limits for tasks assessed     SENSATION: WFL  MUSCLE LENGTH: Hamstrings: Tightness bilaterally  POSTURE: rounded shoulders, increased lumbar lordosis, and anterior pelvic tilt  PALPATION: No tenderness to palpation, but does have some muscle spasms along lumbar multifidi  LUMBAR ROM:   Limited 25-50% with some pain if pushes past comfortable range  LOWER EXTREMITY ROM:     WFL  LOWER EXTREMITY MMT:    09/28/2022:  Hip strength of 4/5, otherwise WFL  LUMBAR SPECIAL TESTS:  Slump test: Negative  FUNCTIONAL TESTS:  5 times sit to stand: 12.3 sec with minimal use of UE  GAIT: Distance walked: >1000 ft Assistive device utilized: None Level of assistance: Complete Independence Comments: Pt reports that she walk at least 20 minutes before she is limited by pain  TODAY'S TREATMENT:                                                                                                                              DATE: 09/28/2022 Reviewed HEP (see below)    PATIENT EDUCATION:  Education details: Pt issued HEP Person educated: Patient Education method: Explanation, Demonstration, and Handouts Education comprehension: verbalized understanding  HOME EXERCISE PROGRAM: Access Code: [email or web address] URL: [email or web address] Date: 09/28/2022 Prepared by: Shelby Dubin Derran Sear  Exercises - Supine Lower Trunk Rotation  - 1 x daily - 7 x weekly - 2 sets - 5 reps - 5 sec hold - Supine Butterfly Groin Stretch  - 1 x daily - 7 x weekly - 1 sets - 5 reps - 15 - 20 sec hold - Supine Posterior Pelvic Tilt  - 1 x daily - 7 x weekly - 2 sets - 10 reps - Seated Hamstring Stretch  - 1 x daily - 7 x weekly - 2 sets - 2  reps - 20 sec hold - Seated March  - 1 x daily - 7 x weekly - 2 sets - 10 reps - Seated Long Arc Quad  - 1 x daily - 7 x weekly - 2 sets - 10 reps  ASSESSMENT:  CLINICAL IMPRESSION: Patient is a 51 y.o. female who was seen today for physical therapy evaluation and treatment for chronic low back pain without sciatica. Pts PLOF is independent and she is working towards being able to participate in an exercise routine and would love for it to include water aerobics.  Pt reports that she had her last injection with Dr Nelva Bush in June 2023 and may be due for another one soon, but she states that she has not been having to get them as frequently as she  did initially.  Pt reports that she has been doing some exercises in the morning prior to getting out of bed in the morning.  Pt presents with increased back pain and muscle weakness and pain with increased ambulation.  Pt is interested in participating in aquatics PT sessions as well as land based PT.  Pt would benefit from skilled PT to address her functional impairments and decreased pain with activities.  OBJECTIVE IMPAIRMENTS: decreased strength, increased muscle spasms, impaired flexibility, and pain.   ACTIVITY LIMITATIONS: carrying, lifting, bending, standing, and transfers  PARTICIPATION LIMITATIONS: community activity  PERSONAL FACTORS: Fitness, Time since onset of injury/illness/exacerbation, and 1-2 comorbidities: DM, obesity  are also affecting patient's functional outcome.   REHAB POTENTIAL: Good  CLINICAL DECISION MAKING: Stable/uncomplicated  EVALUATION COMPLEXITY: Low   GOALS: Goals reviewed with patient? No  SHORT TERM GOALS: Target date: 10/26/2022  Pt will be independent with initial HEP. Baseline: Goal status: INITIAL  2.  Pt will report at least a 30% improvement in functional impairments. Baseline:  Goal status: INITIAL   LONG TERM GOALS: Target date: 11/19/2022  Pt will be independent with advanced HEP. Baseline:   Goal status: INITIAL  2.  Pt will increase bilateral hip strength to at least 4+/5 to allow her to perform transfers with increased ease. Baseline:  Goal status: INITIAL  3.  Pt will improve FOTO to at least 58% to demonstrate improvements in functional mobility. Baseline:  Goal status: INITIAL  4.  Pt will report pain no greater than 2/10 during most functional tasks throughout the day. Baseline:  Goal status: INITIAL  5.  Pt will have a plan to transition to [employer] vs [employer] or other gym for continued fitness following discharge from PT. Baseline:  Goal status: INITIAL   PLAN:  PT FREQUENCY: 1-2x/week  PT DURATION: 8 weeks  PLANNED INTERVENTIONS: Therapeutic exercises, Therapeutic activity, Neuromuscular re-education, Balance training, Gait training, Patient/Family education, Self Care, Joint mobilization, Joint manipulation, Stair training, Aquatic Therapy, Dry Needling, Electrical stimulation, Spinal manipulation, Spinal mobilization, Cryotherapy, Moist heat, Taping, Traction, Ultrasound, Ionotophoresis 2m/ml Dexamethasone, Manual therapy, and Re-evaluation.  PLAN FOR NEXT SESSION: Pt to ask Dr RNelva Bushabout Dry Needling, assess and update HEP as indicated, aquatics, strengthening/core stability   SJuel Burrow PT 09/28/2022, 1:34 PM   [facility] [place] [address]Phone # [phone] [phone]

## 2022-10-05 ENCOUNTER — Encounter: Payer: Self-pay | Admitting: Rehabilitative and Restorative Service Providers"

## 2022-10-05 ENCOUNTER — Ambulatory Visit: Payer: 59 | Admitting: Rehabilitative and Restorative Service Providers"

## 2022-10-05 DIAGNOSIS — M6281 Muscle weakness (generalized): Secondary | ICD-10-CM

## 2022-10-05 DIAGNOSIS — M5459 Other low back pain: Secondary | ICD-10-CM

## 2022-10-05 DIAGNOSIS — R252 Cramp and spasm: Secondary | ICD-10-CM

## 2022-10-05 NOTE — Therapy (Signed)
OUTPATIENT PHYSICAL THERAPY TREATMENT NOTE   Patient Name: Gloria [name] MRN: [id number] DOB:April 22, 1971, 51 y.o., female Today's Date: 10/05/2022   PT End of Session - 10/05/22 1149     Visit Number 2    Date for PT Re-Evaluation 11/19/22    Authorization Type UHC    PT Start Time 0973    PT Stop Time 1225    PT Time Calculation (min) 40 min    Activity Tolerance Patient tolerated treatment well    Behavior During Therapy WFL for tasks assessed/performed             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   DDD (degenerative disc disease), lumbar    Depression    Diabetes ([place])    Diverticulosis    Drug use    Elevated LFTs    Fibromyalgia    GERD (gastroesophageal reflux disease)    History of ETOH abuse    Hyperlipidemia    Hyperplastic colon polyp    Internal hemorrhoids    Migraines    NAFLD (nonalcoholic fatty liver disease)    Obese    Obstructive sleep apnea 02/25/2015   not diagnosed per pt 04-03-20   Osteoarthritis    lower back- DDD   Periodic limb movement disorder 02/25/2014   Pre-diabetes    pt taking metformin   RLS (restless legs syndrome)    Sleep apnea    Smoker    Snoring    Substance abuse ([place])    Swallowing difficulty    Vitamin B12 deficiency    Vitamin D deficiency    Past Surgical History:  Procedure Laterality Date   CERVICAL FUSION     COLONOSCOPY     CYSTOSCOPY  06/06/2013   Procedure: CYSTOSCOPY;  Surgeon: Terrance Mass, MD;  Location: [facility];  Service: Gynecology;;   EXCISION METACARPAL MASS Right 09/03/2021   Procedure: EXCISION METACARPAL MASS RIGHT SMALL FINGER;  Surgeon: Daryll Brod, MD;   Location: [facility];  Service: Orthopedics;  Laterality: Right;   INTRAUTERINE DEVICE INSERTION  08/09/2008   PARAGUARD- removed   kidney surgery  1995   abcess   LAPAROSCOPY N/A 06/06/2013   Procedure: LAPAROSCOPY DIAGNOSTIC;  Surgeon: Terrance Mass, MD;  Location: [facility];  Service: Gynecology;  Laterality: N/A;   LAPAROTOMY  06/06/2013   Procedure: EXPLORATORY LAPAROTOMY;  Surgeon: Terrance Mass, MD;  Location: [facility];  Service: Gynecology;;   LIVER BIOPSY     radial tunnel Bilateral 2005   VAGINAL HYSTERECTOMY Left 06/06/2013   Procedure: Vaginal Hysterectomy with Patiial Right Salpingectomy;  Surgeon: Terrance Mass, MD;  Location: [facility];  Service: Gynecology;  Laterality: Left;   Patient Active Problem List   Diagnosis Date Noted   White coat syndrome without hypertension 08/05/2022   Mixed hyperlipidemia 07/14/2022   Stress 06/09/2022   Essential hypertension 05/03/2022   At risk for heart disease 05/03/2022   Vitamin D deficiency 04/07/2022   At risk for hypoglycemia 04/07/2022   Non-restorative sleep 10/25/2020  Gasping for breath 09/23/2020   Class 3 severe obesity with serious comorbidity and body mass index (BMI) of 45.0 to 49.9 in adult [facility]) 09/23/2020   Hypersomnia, recurrent 09/23/2020   MDD (recurrent major depressive disorder) in remission ([place]) 09/23/2020   Diabetes mellitus ([place]) 04/06/2019   Chronic back pain 02/25/2015   Periodic limb movement disorder 02/25/2014   Restless legs syndrome 12/15/2013   Cardiac murmur 10/16/2013   Snoring 10/16/2013   Hx of migraine headaches 10/16/2013   Fluid retention 10/12/2013   Weight gain 10/12/2013   Anxiety 96/22/2979   Alcoholic liver disease ([place]) 10/28/2012   Ex-cigarette smoker 10/28/2012   Fibromyalgia 10/27/2012   Depression with anxiety 10/27/2012   GERD (gastroesophageal reflux disease) 10/27/2012   H/O ETOH abuse 10/27/2012    PCP: Billie Ruddy, MD  REFERRING PROVIDER:  Starlyn Skeans, MD  REFERRING DIAG: M54.50,G89.29 (ICD-10-CM) - Chronic bilateral low back pain without sciatica  Rationale for Evaluation and Treatment: Rehabilitation  THERAPY DIAG:  Other low back pain  Muscle weakness (generalized)  Cramp and spasm  ONSET DATE: Approx 14 years ago  SUBJECTIVE:                                                                                                                                                                                           SUBJECTIVE STATEMENT: Pt reports that over the weekend she had severe pain and had to take pain medication.  States that she is feeling some better today.  PERTINENT HISTORY:  DM, HTN, Obesity  PAIN:  Are you having pain? Yes: NPRS scale: 5-9/10 Pain location: low back Pain description: aching Aggravating factors: change in positions Relieving factors: sitting in recliner  PRECAUTIONS: None  WEIGHT BEARING RESTRICTIONS: No  FALLS:  Has patient fallen in last 6 months? 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)  PLOF: Independent, Vocation/Vocational requirements: Prolonged computer work, and Leisure: play cards with friends, AA meetings/related activities  PATIENT GOALS: To be able to begin an exercise routine and get stronger  NEXT MD VISIT: Dr Leafy Ro November 16  OBJECTIVE:   DIAGNOSTIC FINDINGS:  Has has imaging in the past with Dr Nelva Bush that revealed DDD  PATIENT SURVEYS:  FOTO 50% (projected 58% by visit 12)  SCREENING FOR RED FLAGS: Bowel or bladder incontinence: No Spinal tumors: No Cauda equina syndrome: No Compression fracture: No Abdominal aneurysm: No  COGNITION: Overall cognitive status: Within functional limits for  tasks  assessed     SENSATION: WFL  MUSCLE LENGTH: Hamstrings: Tightness bilaterally  POSTURE: rounded shoulders, increased lumbar lordosis, and anterior pelvic tilt  PALPATION: No tenderness to palpation, but does have some muscle spasms along lumbar multifidi  LUMBAR ROM:   Limited 25-50% with some pain if pushes past comfortable range  LOWER EXTREMITY ROM:     WFL  LOWER EXTREMITY MMT:    09/28/2022:  Hip strength of 4/5, otherwise WFL  LUMBAR SPECIAL TESTS:  Slump test: Negative  FUNCTIONAL TESTS:  5 times sit to stand: 12.3 sec with minimal use of UE  GAIT: Distance walked: >1000 ft Assistive device utilized: None Level of assistance: Complete Independence Comments: Pt reports that she walk at least 20 minutes before she is limited by pain  TODAY'S TREATMENT:                                                                                                                              DATE: 10/05/2022 Nustep level 3 x6 min with PT present to discuss status Seated marching and LAQ x10 reps bilat Manual Therapy:  pt in right sidelying using Addaday to left paraspinals, glutes/piriformis, TFL/IT band, and hamstrings Supine butterfly stretch 2x20 sec Lower trunk rotation x10 Supine posterior pelvic tilt 2x10 Supine hamstring stretch with strap 2x20 sec bilat Supine clamshells with blue loop 2x10 Shoulder press in decompression pose 2x10 Educated pt in using a tennis ball to assist with self trigger point release to piriformis, she was able to return demonstration Seated piriformis stretch 2x20 sec bilat   DATE: 09/28/2022 Reviewed HEP (see below)    PATIENT EDUCATION:  Education details: Pt issued HEP Person educated: Patient Education method: Explanation, Demonstration, and Handouts Education comprehension: verbalized understanding  HOME EXERCISE PROGRAM: Access Code: [email or web address] URL: [email or web address] Date: 10/05/2022 Prepared by: Shelby Dubin  Lilley Hubble  Exercises - Supine Lower Trunk Rotation  - 1 x daily - 7 x weekly - 2 sets - 5 reps - 5 sec hold - Supine Butterfly Groin Stretch  - 1 x daily - 7 x weekly - 1 sets - 5 reps - 15 - 20 sec hold - Supine Posterior Pelvic Tilt  - 1 x daily - 7 x weekly - 2 sets - 10 reps - Seated Hamstring Stretch  - 1 x daily - 7 x weekly - 2 sets - 2 reps - 20 sec hold - Seated March  - 1 x daily - 7 x weekly - 2 sets - 10 reps - Seated Long Arc Quad  - 1 x daily - 7 x weekly - 2 sets - 10 reps - Seated Piriformis Stretch  - 1 x daily - 7 x weekly - 2 sets - 2 reps - 20 sec hold  ASSESSMENT:  CLINICAL IMPRESSION: Ms Sitzmann presents to skilled PT today with increased pain overall.  Pt was able to perform NuStep with some soreness reported.  Attempted seated exercises, but pt limited  with pain.  Performed manual therapy with Addaday in right sidelying, as pt could not comfortably lie in prone.  Pt reported decreased pain following Addaday.  Pt to transition to aquatic PT starting next week.  Added piriformis stretch to HEP, as pt felt some relief with exercise.  Pt continues to require skilled PT to progress towards goal related activities.  OBJECTIVE IMPAIRMENTS: decreased strength, increased muscle spasms, impaired flexibility, and pain.   ACTIVITY LIMITATIONS: carrying, lifting, bending, standing, and transfers  PARTICIPATION LIMITATIONS: community activity  PERSONAL FACTORS: Fitness, Time since onset of injury/illness/exacerbation, and 1-2 comorbidities: DM, obesity  are also affecting patient's functional outcome.   REHAB POTENTIAL: Good  CLINICAL DECISION MAKING: Stable/uncomplicated  EVALUATION COMPLEXITY: Low   GOALS: Goals reviewed with patient? No  SHORT TERM GOALS: Target date: 10/26/2022  Pt will be independent with initial HEP. Baseline: Goal status: IN PROGRESS  2.  Pt will report at least a 30% improvement in functional impairments. Baseline:  Goal status: INITIAL   LONG  TERM GOALS: Target date: 11/19/2022  Pt will be independent with advanced HEP. Baseline:  Goal status: INITIAL  2.  Pt will increase bilateral hip strength to at least 4+/5 to allow her to perform transfers with increased ease. Baseline:  Goal status: INITIAL  3.  Pt will improve FOTO to at least 58% to demonstrate improvements in functional mobility. Baseline:  Goal status: INITIAL  4.  Pt will report pain no greater than 2/10 during most functional tasks throughout the day. Baseline:  Goal status: INITIAL  5.  Pt will have a plan to transition to [employer] vs [employer] or other gym for continued fitness following discharge from PT. Baseline:  Goal status: INITIAL   PLAN:  PT FREQUENCY: 1-2x/week  PT DURATION: 8 weeks  PLANNED INTERVENTIONS: Therapeutic exercises, Therapeutic activity, Neuromuscular re-education, Balance training, Gait training, Patient/Family education, Self Care, Joint mobilization, Joint manipulation, Stair training, Aquatic Therapy, Dry Needling, Electrical stimulation, Spinal manipulation, Spinal mobilization, Cryotherapy, Moist heat, Taping, Traction, Ultrasound, Ionotophoresis 25m/ml Dexamethasone, Manual therapy, and Re-evaluation.  PLAN FOR NEXT SESSION: Aquatic PT, assess and update HEP as indicated, aquatics, strengthening/core stability   SJuel Burrow PT 10/05/2022, 12:31 PM   [facility] IncSpecialty Rehab Services [address] [place] [address]Phone # [phone] [phone]

## 2022-10-07 ENCOUNTER — Ambulatory Visit (INDEPENDENT_AMBULATORY_CARE_PROVIDER_SITE_OTHER): Payer: 59 | Admitting: Family Medicine

## 2022-10-07 ENCOUNTER — Encounter (INDEPENDENT_AMBULATORY_CARE_PROVIDER_SITE_OTHER): Payer: Self-pay | Admitting: Family Medicine

## 2022-10-07 ENCOUNTER — Other Ambulatory Visit (HOSPITAL_COMMUNITY): Payer: Self-pay

## 2022-10-07 VITALS — BP 165/77 | HR 72 | Temp 97.7°F | Ht 65.0 in | Wt 249.0 lb

## 2022-10-07 DIAGNOSIS — E1169 Type 2 diabetes mellitus with other specified complication: Secondary | ICD-10-CM | POA: Diagnosis not present

## 2022-10-07 DIAGNOSIS — Z6841 Body Mass Index (BMI) 40.0 and over, adult: Secondary | ICD-10-CM

## 2022-10-07 DIAGNOSIS — Z7985 Long-term (current) use of injectable non-insulin antidiabetic drugs: Secondary | ICD-10-CM

## 2022-10-07 DIAGNOSIS — M545 Low back pain, unspecified: Secondary | ICD-10-CM

## 2022-10-07 DIAGNOSIS — E669 Obesity, unspecified: Secondary | ICD-10-CM | POA: Diagnosis not present

## 2022-10-07 DIAGNOSIS — I10 Essential (primary) hypertension: Secondary | ICD-10-CM | POA: Diagnosis not present

## 2022-10-07 MED ORDER — TIRZEPATIDE 15 MG/0.5ML ~~LOC~~ SOAJ
15.0000 mg | SUBCUTANEOUS | 0 refills | Status: DC
  Filled 2022-10-07: qty 2, 28d supply, fill #0

## 2022-10-08 ENCOUNTER — Other Ambulatory Visit (HOSPITAL_COMMUNITY): Payer: Self-pay

## 2022-10-12 ENCOUNTER — Encounter: Payer: 59 | Admitting: Rehabilitative and Restorative Service Providers"

## 2022-10-12 ENCOUNTER — Ambulatory Visit (HOSPITAL_BASED_OUTPATIENT_CLINIC_OR_DEPARTMENT_OTHER): Payer: 59 | Admitting: Physical Therapy

## 2022-10-13 ENCOUNTER — Other Ambulatory Visit (HOSPITAL_COMMUNITY): Payer: Self-pay

## 2022-10-18 ENCOUNTER — Encounter: Payer: Self-pay | Admitting: Obstetrics & Gynecology

## 2022-10-19 ENCOUNTER — Encounter: Payer: 59 | Admitting: Physical Therapy

## 2022-10-19 NOTE — Progress Notes (Signed)
Chief Complaint:   OBESITY Gloria [name] is here to discuss her progress with her obesity treatment plan along with follow-up of her obesity related diagnoses. Gloria [name] is on the Category 2 Plan and states she is following her eating plan approximately 85% of the time. Gloria [name] states she is doing physical therapy for 20 minutes 2 times per week.  Today's visit was #: 24 Starting weight: 274 lbs Starting date: 08/07/2021 Today's weight: 249 lbs Today's date: 10/07/2022 Total lbs lost to date: 25 Total lbs lost since last in-office visit: 0  Interim History: Gloria [name] has maintained her weight loss.  She was craving chips and sweets until her Darcel Bayley was increased to 15 mg, and she notes nice improvement with decreased cravings and decreased appetite over the past week.  She is getting back on track with her eating plan.  Subjective:   1. Type 2 diabetes mellitus with other specified complication, unspecified whether long term insulin use ([place]) Gloria [name] just increased her Mounjaro to 15 mg last week and she reports decreased cravings this week.  No side effects were noted.  2. Low back pain, unspecified back pain laterality, unspecified chronicity, unspecified whether sciatica present Gloria [name] started physical therapy for lower back pain, and she is going to do water therapy.  She continues to work on her eating plan to promote weight loss.  3. Essential hypertension Gloria [name] is very stressed today due to traffic jam, and her blood pressure is normally good.  She is not on medications.  Assessment/Plan:   1. Type 2 diabetes mellitus with other specified complication, unspecified whether long term insulin use ([place]) Gloria [name] will continue Mounjaro, and she will continue her diet and exercise.  We will refill Mounjaro 15 mg once weekly for 1 month.  - tirzepatide (MOUNJARO) 15 MG/0.5ML Pen; Inject 15 mg into the skin once a week.  Dispense: 6 mL; Refill: 0  2. Low back pain, unspecified back  pain laterality, unspecified chronicity, unspecified whether sciatica present Gloria [name] will continue her physical therapy and she will continue with her healthy eating plan.  3. Essential hypertension Gloria [name] will continue with her healthy eating plan and exercise, and we will continue to monitor.  4. Obesity, Current BMI 41.5 Gloria [name] is currently in the action stage of change. As such, her goal is to continue with weight loss efforts. She has agreed to the Category 2 Plan.   Exercise goals: As is.   Behavioral modification strategies: increasing lean protein intake, decreasing simple carbohydrates, and holiday eating strategies .  Jace has agreed to follow-up with our clinic in 3 weeks. She was informed of the importance of frequent follow-up visits to maximize her success with intensive lifestyle modifications for her multiple health conditions.   Objective:   Blood pressure (!) 165/77, pulse 72, temperature 97.7 F (36.5 C), height 5' 5"  (1.651 m), weight 249 lb (112.9 kg), last menstrual period 04/25/2013, SpO2 99 %. Body mass index is 41.44 kg/m.  General: Cooperative, alert, well developed, in no acute distress. HEENT: Conjunctivae and lids unremarkable. Cardiovascular: Regular rhythm.  Lungs: Normal work of breathing. Neurologic: No focal deficits.   Lab Results  Component Value Date   CREATININE 0.58 07/14/2022   BUN 7 07/14/2022   NA 133 (L) 07/14/2022   K 4.3 07/14/2022   CL 97 07/14/2022   CO2 23 07/14/2022   Lab Results  Component Value Date   ALT 22 07/14/2022   AST 19 07/14/2022   ALKPHOS 79 07/14/2022   BILITOT <  0.2 07/14/2022   Lab Results  Component Value Date   HGBA1C 5.6 07/14/2022   HGBA1C 5.6 12/28/2021   HGBA1C 6.2 (H) 08/07/2021   HGBA1C 6.3 (A) 05/20/2021   HGBA1C 5.7 (H) 09/19/2020   Lab Results  Component Value Date   INSULIN 41.2 (H) 07/14/2022   INSULIN 30.5 (H) 12/28/2021   INSULIN 29.5 (H) 08/07/2021   Lab Results  Component  Value Date   TSH 2.860 08/07/2021   Lab Results  Component Value Date   CHOL 216 (H) 07/14/2022   HDL 44 07/14/2022   LDLCALC 139 (H) 07/14/2022   LDLDIRECT 145.0 07/18/2019   TRIG 185 (H) 07/14/2022   CHOLHDL 6.1 (H) 09/19/2020   Lab Results  Component Value Date   VD25OH 64.0 07/14/2022   VD25OH 60.0 12/28/2021   VD25OH 45.2 08/07/2021   Lab Results  Component Value Date   WBC 10.4 08/07/2021   HGB 13.2 08/07/2021   HCT 40.2 08/07/2021   MCV 89 08/07/2021   PLT 284 08/07/2021   Lab Results  Component Value Date   IRON 54 10/06/2017   TIBC 304 10/06/2017   FERRITIN 151 (H) 10/06/2017   Attestation Statements:   Reviewed by clinician on day of visit: allergies, medications, problem list, medical history, surgical history, family history, social history, and previous encounter notes.   I, Trixie Dredge, am acting as transcriptionist for Dennard Nip, MD.  I have reviewed the above documentation for accuracy and completeness, and I agree with the above. -  Dennard Nip, MD

## 2022-10-22 ENCOUNTER — Ambulatory Visit
Admission: RE | Admit: 2022-10-22 | Discharge: 2022-10-22 | Disposition: A | Discharge status: Home / Self Care | Payer: 59 | Source: Ambulatory Visit | Attending: Family Medicine | Admitting: Family Medicine

## 2022-10-22 ENCOUNTER — Ambulatory Visit: Payer: 59 | Attending: Family Medicine | Admitting: Physical Therapy

## 2022-10-22 ENCOUNTER — Encounter: Payer: Self-pay | Admitting: Physical Therapy

## 2022-10-22 DIAGNOSIS — M5459 Other low back pain: Secondary | ICD-10-CM | POA: Diagnosis present

## 2022-10-22 DIAGNOSIS — R252 Cramp and spasm: Secondary | ICD-10-CM | POA: Diagnosis present

## 2022-10-22 DIAGNOSIS — M6281 Muscle weakness (generalized): Secondary | ICD-10-CM | POA: Insufficient documentation

## 2022-10-22 DIAGNOSIS — Z1231 Encounter for screening mammogram for malignant neoplasm of breast: Secondary | ICD-10-CM

## 2022-10-22 NOTE — Therapy (Signed)
OUTPATIENT PHYSICAL THERAPY TREATMENT NOTE   Patient Name: Gloria [name] MRN: [id number] DOB:1971/02/03, 51 y.o., female Today's Date: 10/22/2022   PT End of Session - 10/22/22 2108     Visit Number 3    Date for PT Re-Evaluation 11/19/22    Authorization Type U[employer]    PT Start Time 7672    PT Stop Time 1330    PT Time Calculation (min) 55 min    Activity Tolerance Patient tolerated treatment well    Behavior During Therapy WFL for tasks assessed/performed              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   DDD (degenerative disc disease), lumbar    Depression    Diabetes ([place])    Diverticulosis    Drug use    Elevated LFTs    Fibromyalgia    GERD (gastroesophageal reflux disease)    History of ETOH abuse    Hyperlipidemia    Hyperplastic colon polyp    Internal hemorrhoids    Migraines    NAFLD (nonalcoholic fatty liver disease)    Obese    Obstructive sleep apnea 02/25/2015   not diagnosed per pt 04-03-20   Osteoarthritis    lower back- DDD   Periodic limb movement disorder 02/25/2014   Pre-diabetes    pt taking metformin   RLS (restless legs syndrome)    Sleep apnea    Smoker    Snoring    Substance abuse ([place])    Swallowing difficulty    Vitamin B12 deficiency    Vitamin D deficiency    Past Surgical History:  Procedure Laterality Date   CERVICAL FUSION     COLONOSCOPY     CYSTOSCOPY  06/06/2013   Procedure: CYSTOSCOPY;  Surgeon: Terrance Mass, MD;  Location: [facility];  Service: Gynecology;;   EXCISION METACARPAL MASS Right 09/03/2021   Procedure: EXCISION METACARPAL MASS RIGHT SMALL FINGER;  Surgeon: Daryll Brod, MD;   Location: [facility];  Service: Orthopedics;  Laterality: Right;   INTRAUTERINE DEVICE INSERTION  08/09/2008   PARAGUARD- removed   kidney surgery  1995   abcess   LAPAROSCOPY N/A 06/06/2013   Procedure: LAPAROSCOPY DIAGNOSTIC;  Surgeon: Terrance Mass, MD;  Location: [facility];  Service: Gynecology;  Laterality: N/A;   LAPAROTOMY  06/06/2013   Procedure: EXPLORATORY LAPAROTOMY;  Surgeon: Terrance Mass, MD;  Location: [facility];  Service: Gynecology;;   LIVER BIOPSY     radial tunnel Bilateral 2005   VAGINAL HYSTERECTOMY Left 06/06/2013   Procedure: Vaginal Hysterectomy with Patiial Right Salpingectomy;  Surgeon: Terrance Mass, MD;  Location: [facility];  Service: Gynecology;  Laterality: Left;   Patient Active Problem List   Diagnosis Date Noted   Low back pain 10/07/2022   White coat syndrome without hypertension 08/05/2022   Mixed hyperlipidemia 07/14/2022   Stress 06/09/2022   Essential hypertension 05/03/2022   At risk for heart disease 05/03/2022   Vitamin D deficiency 04/07/2022   At risk for hypoglycemia  04/07/2022   Non-restorative sleep 10/25/2020   Gasping for breath 09/23/2020   Class 3 severe obesity with serious comorbidity and body mass index (BMI) of 45.0 to 49.9 in adult [facility]) 09/23/2020   Hypersomnia, recurrent 09/23/2020   MDD (recurrent major depressive disorder) in remission ([place]) 09/23/2020   Diabetes mellitus ([place]) 04/06/2019   Chronic back pain 02/25/2015   Periodic limb movement disorder 02/25/2014   Restless legs syndrome 12/15/2013   Cardiac murmur 10/16/2013   Snoring 10/16/2013   Hx of migraine headaches 10/16/2013   Fluid retention 10/12/2013   Weight gain 10/12/2013   Anxiety 01/65/5374   Alcoholic liver disease ([place]) 10/28/2012   Ex-cigarette smoker 10/28/2012   Fibromyalgia 10/27/2012   Depression with anxiety 10/27/2012   GERD (gastroesophageal reflux disease) 10/27/2012   H/O ETOH abuse 10/27/2012    PCP: Billie Ruddy,  MD  REFERRING PROVIDER: Starlyn Skeans, MD  REFERRING DIAG: M54.50,G89.29 (ICD-10-CM) - Chronic bilateral low back pain without sciatica  Rationale for Evaluation and Treatment: Rehabilitation  THERAPY DIAG:  Other low back pain  Muscle weakness (generalized)  Cramp and spasm  ONSET DATE: Approx 14 years ago  SUBJECTIVE:                                                                                                                                                                                           SUBJECTIVE STATEMENT: Had back injections the other day and my pain is much better.  PERTINENT HISTORY:  DM, HTN, Obesity  PAIN:  Are you having pain? No  PRECAUTIONS: None  WEIGHT BEARING RESTRICTIONS: No  FALLS:  Has patient fallen in last 6 months? 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)  PLOF: Independent, Vocation/Vocational requirements: Prolonged computer work, and Leisure: play cards with friends, AA meetings/related activities  PATIENT GOALS: To be able to begin an exercise routine and get stronger  NEXT MD VISIT: Dr Leafy Ro November 16  OBJECTIVE:   DIAGNOSTIC FINDINGS:  Has has imaging in the past with Dr Nelva Bush that revealed DDD  PATIENT SURVEYS:  FOTO 50% (projected 58% by visit 12)  SCREENING FOR RED FLAGS: Bowel or bladder incontinence: No Spinal tumors: No Cauda equina syndrome: No Compression fracture: No Abdominal aneurysm: No  COGNITION: Overall cognitive status: Within functional limits for tasks assessed     SENSATION: WFL  MUSCLE LENGTH: Hamstrings: Tightness bilaterally  POSTURE: rounded shoulders, increased lumbar lordosis, and anterior pelvic tilt  PALPATION: No tenderness to palpation, but does have some  muscle spasms along lumbar multifidi  LUMBAR ROM:   Limited 25-50% with some pain if pushes past comfortable range  LOWER EXTREMITY ROM:     WFL  LOWER EXTREMITY MMT:    09/28/2022:  Hip strength of 4/5, otherwise WFL  LUMBAR SPECIAL TESTS:  Slump test: Negative  FUNCTIONAL TESTS:  5 times sit to stand: 12.3 sec with minimal use of UE  GAIT: Distance walked: >1000 ft Assistive device utilized: None Level of assistance: Complete Independence Comments: Pt reports that she walk at least 20 minutes before she is limited by pain  TODAY'S TREATMENT:    10/22/22: Pt arrives for aquatic physical therapy. Treatment took place in 3.5-5.5 feet of water. Water temperature was 91 degrees F. Pt entered the pool via stairs, reciprocally with mild use of rails. Pt requires buoyancy of water for support and to offload joints with strengthening exercises.  Seated water bench with 75% submersion Pt performed seated LE AROM exercises 20x in all planes, concurrent discussion of water principles and how we would be using them. Pt verbally understood all. 75% depth water walking with yellow noodle for postural support 6 lengths in each direction. VC for core. Standing against wall for SKC stretch 3x 10 sec Bil, post pelvic tilt 10x 5 sec hold, hip 3 ways 15x Bil with UE balance support. High knee marching with noodle 4 lengths followed by horizontal decompression without any floatation x10 min.                                                                                                                           DATE: 10/05/2022 Nustep level 3 x6 min with PT present to discuss status Seated marching and LAQ x10 reps bilat Manual Therapy:  pt in right sidelying using [employer] to left paraspinals, glutes/piriformis, TFL/IT band, and hamstrings Supine butterfly stretch 2x20 sec Lower trunk rotation x10 Supine posterior pelvic tilt 2x10 Supine hamstring stretch with strap 2x20 sec bilat Supine clamshells  with blue loop 2x10 Shoulder press in decompression pose 2x10 Educated pt in using a tennis ball to assist with self trigger point release to piriformis, she was able to return demonstration Seated piriformis stretch 2x20 sec bilat   DATE: 09/28/2022 Reviewed HEP (see below)    PATIENT EDUCATION:  Education details: Pt issued HEP Person educated: Patient Education method: Explanation, Demonstration, and Handouts Education comprehension: verbalized understanding  HOME EXERCISE PROGRAM: Access Code: [email or web address] URL: [email or web address] Date: 10/05/2022 Prepared by: Shelby Dubin Menke  Exercises - Supine Lower Trunk Rotation  - 1 x daily - 7 x weekly - 2 sets - 5 reps - 5 sec hold - Supine Butterfly Groin Stretch  - 1 x daily - 7 x weekly - 1 sets - 5 reps - 15 - 20 sec hold - Supine Posterior Pelvic Tilt  - 1 x daily - 7 x weekly - 2 sets - 10 reps - Seated Hamstring Stretch  -  1 x daily - 7 x weekly - 2 sets - 2 reps - 20 sec hold - Seated March  - 1 x daily - 7 x weekly - 2 sets - 10 reps - Seated Long Arc Quad  - 1 x daily - 7 x weekly - 2 sets - 10 reps - Seated Piriformis Stretch  - 1 x daily - 7 x weekly - 2 sets - 2 reps - 20 sec hold  ASSESSMENT:  CLINICAL IMPRESSION: Pt arrives to first aquatic PT treatment. Pt was educated in water principles and how we would be using them. Pt is interested in using aquatic exercise as her main form of exercise as it will be kindest to her joints and feet. Pt had no pain with any of her exercises/activities in the water.   OBJECTIVE IMPAIRMENTS: decreased strength, increased muscle spasms, impaired flexibility, and pain.   ACTIVITY LIMITATIONS: carrying, lifting, bending, standing, and transfers  PARTICIPATION LIMITATIONS: community activity  PERSONAL FACTORS: Fitness, Time since onset of injury/illness/exacerbation, and 1-2 comorbidities: DM, obesity  are also affecting patient's functional outcome.   REHAB POTENTIAL:  Good  CLINICAL DECISION MAKING: Stable/uncomplicated  EVALUATION COMPLEXITY: Low   GOALS: Goals reviewed with patient? No  SHORT TERM GOALS: Target date: 10/26/2022  Pt will be independent with initial HEP. Baseline: Goal status: IN PROGRESS  2.  Pt will report at least a 30% improvement in functional impairments. Baseline:  Goal status: INITIAL   LONG TERM GOALS: Target date: 11/19/2022  Pt will be independent with advanced HEP. Baseline:  Goal status: INITIAL  2.  Pt will increase bilateral hip strength to at least 4+/5 to allow her to perform transfers with increased ease. Baseline:  Goal status: INITIAL  3.  Pt will improve FOTO to at least 58% to demonstrate improvements in functional mobility. Baseline:  Goal status: INITIAL  4.  Pt will report pain no greater than 2/10 during most functional tasks throughout the day. Baseline:  Goal status: INITIAL  5.  Pt will have a plan to transition to [place] vs [employer] or other gym for continued fitness following discharge from PT. Baseline:  Goal status: INITIAL   PLAN:  PT FREQUENCY: 1-2x/week  PT DURATION: 8 weeks  PLANNED INTERVENTIONS: Therapeutic exercises, Therapeutic activity, Neuromuscular re-education, Balance training, Gait training, Patient/Family education, Self Care, Joint mobilization, Joint manipulation, Stair training, Aquatic Therapy, Dry Needling, Electrical stimulation, Spinal manipulation, Spinal mobilization, Cryotherapy, Moist heat, Taping, Traction, Ultrasound, Ionotophoresis 52m/ml Dexamethasone, Manual therapy, and Re-evaluation.  PLAN FOR NEXT SESSION: Aquatic PT, assess and update HEP as indicated, aquatics, strengthening/core stability   Edwardo Wojnarowski, PTA 10/22/2022, 9:10 PM   [facility] [place] [place]  [id number] # [phone] [phone]

## 2022-10-26 ENCOUNTER — Encounter: Payer: 59 | Admitting: Rehabilitative and Restorative Service Providers"

## 2022-10-28 ENCOUNTER — Ambulatory Visit (INDEPENDENT_AMBULATORY_CARE_PROVIDER_SITE_OTHER): Payer: 59 | Admitting: Family Medicine

## 2022-10-28 ENCOUNTER — Other Ambulatory Visit (HOSPITAL_COMMUNITY): Payer: Self-pay

## 2022-10-28 ENCOUNTER — Encounter (INDEPENDENT_AMBULATORY_CARE_PROVIDER_SITE_OTHER): Payer: Self-pay | Admitting: Family Medicine

## 2022-10-28 VITALS — BP 154/54 | HR 71 | Temp 97.9°F | Ht 65.0 in | Wt 247.0 lb

## 2022-10-28 DIAGNOSIS — Z6841 Body Mass Index (BMI) 40.0 and over, adult: Secondary | ICD-10-CM | POA: Diagnosis not present

## 2022-10-28 DIAGNOSIS — I10 Essential (primary) hypertension: Secondary | ICD-10-CM | POA: Diagnosis not present

## 2022-10-28 DIAGNOSIS — E1169 Type 2 diabetes mellitus with other specified complication: Secondary | ICD-10-CM | POA: Diagnosis not present

## 2022-10-28 DIAGNOSIS — E669 Obesity, unspecified: Secondary | ICD-10-CM | POA: Diagnosis not present

## 2022-10-28 DIAGNOSIS — Z7985 Long-term (current) use of injectable non-insulin antidiabetic drugs: Secondary | ICD-10-CM

## 2022-10-28 MED ORDER — TIRZEPATIDE 15 MG/0.5ML ~~LOC~~ SOAJ
15.0000 mg | SUBCUTANEOUS | 0 refills | Status: DC
  Filled 2022-10-28: qty 6, 84d supply, fill #0
  Filled 2022-11-08: qty 2, 28d supply, fill #0

## 2022-11-02 ENCOUNTER — Ambulatory Visit (HOSPITAL_BASED_OUTPATIENT_CLINIC_OR_DEPARTMENT_OTHER): Payer: 59 | Admitting: Physical Therapy

## 2022-11-02 ENCOUNTER — Encounter: Payer: 59 | Admitting: Physical Therapy

## 2022-11-04 ENCOUNTER — Encounter: Payer: 59 | Admitting: Physical Therapy

## 2022-11-05 ENCOUNTER — Encounter (HOSPITAL_BASED_OUTPATIENT_CLINIC_OR_DEPARTMENT_OTHER): Payer: Self-pay | Admitting: Physical Therapy

## 2022-11-05 ENCOUNTER — Ambulatory Visit (HOSPITAL_BASED_OUTPATIENT_CLINIC_OR_DEPARTMENT_OTHER): Payer: 59 | Attending: Family Medicine | Admitting: Physical Therapy

## 2022-11-05 DIAGNOSIS — M545 Low back pain, unspecified: Secondary | ICD-10-CM | POA: Diagnosis present

## 2022-11-05 DIAGNOSIS — M6281 Muscle weakness (generalized): Secondary | ICD-10-CM | POA: Insufficient documentation

## 2022-11-05 DIAGNOSIS — G8929 Other chronic pain: Secondary | ICD-10-CM | POA: Diagnosis not present

## 2022-11-05 DIAGNOSIS — R252 Cramp and spasm: Secondary | ICD-10-CM

## 2022-11-05 DIAGNOSIS — M5459 Other low back pain: Secondary | ICD-10-CM

## 2022-11-05 NOTE — Therapy (Signed)
OUTPATIENT PHYSICAL THERAPY TREATMENT NOTE   Patient Name: Gloria [name] MRN: [id number] DOB:Mar 12, 1971, 51 y.o., female Today's Date: 11/05/2022   PT End of Session - 11/05/22 1512     Visit Number 4    Date for PT Re-Evaluation 11/19/22    Authorization Type UHC    PT Start Time 32    PT Stop Time 1552    PT Time Calculation (min) 40 min              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   DDD (degenerative disc disease), lumbar    Depression    Diabetes ([place])    Diverticulosis    Drug use    Elevated LFTs    Fibromyalgia    GERD (gastroesophageal reflux disease)    History of ETOH abuse    Hyperlipidemia    Hyperplastic colon polyp    Internal hemorrhoids    Migraines    NAFLD (nonalcoholic fatty liver disease)    Obese    Obstructive sleep apnea 02/25/2015   not diagnosed per pt 04-03-20   Osteoarthritis    lower back- DDD   Periodic limb movement disorder 02/25/2014   Pre-diabetes    pt taking metformin   RLS (restless legs syndrome)    Sleep apnea    Smoker    Snoring    Substance abuse ([place])    Swallowing difficulty    Vitamin B12 deficiency    Vitamin D deficiency    Past Surgical History:  Procedure Laterality Date   CERVICAL FUSION     COLONOSCOPY     CYSTOSCOPY  06/06/2013   Procedure: CYSTOSCOPY;  Surgeon: Terrance Mass, MD;  Location: [facility];  Service: Gynecology;;   EXCISION METACARPAL MASS Right 09/03/2021   Procedure: EXCISION METACARPAL MASS RIGHT SMALL FINGER;  Surgeon: Daryll Brod, MD;  Location: [facility];  Service: Orthopedics;  Laterality: Right;   INTRAUTERINE DEVICE INSERTION   08/09/2008   PARAGUARD- removed   kidney surgery  1995   abcess   LAPAROSCOPY N/A 06/06/2013   Procedure: LAPAROSCOPY DIAGNOSTIC;  Surgeon: Terrance Mass, MD;  Location: [facility];  Service: Gynecology;  Laterality: N/A;   LAPAROTOMY  06/06/2013   Procedure: EXPLORATORY LAPAROTOMY;  Surgeon: Terrance Mass, MD;  Location: [facility];  Service: Gynecology;;   LIVER BIOPSY     radial tunnel Bilateral 2005   VAGINAL HYSTERECTOMY Left 06/06/2013   Procedure: Vaginal Hysterectomy with Patiial Right Salpingectomy;  Surgeon: Terrance Mass, MD;  Location: [facility];  Service: Gynecology;  Laterality: Left;   Patient Active Problem List   Diagnosis Date Noted   Low back pain 10/07/2022   White coat syndrome without hypertension 08/05/2022   Mixed hyperlipidemia 07/14/2022   Stress 06/09/2022   Essential hypertension 05/03/2022   At risk for heart disease 05/03/2022   Vitamin D deficiency 04/07/2022   At risk for hypoglycemia 04/07/2022   Non-restorative sleep 10/25/2020   Gasping for breath 09/23/2020   Class 3 severe obesity with  serious comorbidity and body mass index (BMI) of 45.0 to 49.9 in adult [facility]) 09/23/2020   Hypersomnia, recurrent 09/23/2020   MDD (recurrent major depressive disorder) in remission ([place]) 09/23/2020   Diabetes mellitus ([place]) 04/06/2019   Chronic back pain 02/25/2015   Periodic limb movement disorder 02/25/2014   Restless legs syndrome 12/15/2013   Cardiac murmur 10/16/2013   Snoring 10/16/2013   Hx of migraine headaches 10/16/2013   Fluid retention 10/12/2013   Weight gain 10/12/2013   Anxiety 38/75/6433   Alcoholic liver disease ([place]) 10/28/2012   Ex-cigarette smoker 10/28/2012   Fibromyalgia 10/27/2012   Depression with anxiety 10/27/2012   GERD (gastroesophageal reflux disease) 10/27/2012   H/O ETOH abuse 10/27/2012    PCP: Billie Ruddy, MD  REFERRING PROVIDER: Starlyn Skeans, MD  REFERRING DIAG: M54.50,G89.29 (ICD-10-CM) - Chronic bilateral low  back pain without sciatica  Rationale for Evaluation and Treatment: Rehabilitation  THERAPY DIAG:  Other low back pain  Muscle weakness (generalized)  Cramp and spasm  ONSET DATE: Approx 14 years ago  SUBJECTIVE:                                                                                                                                                                                           SUBJECTIVE STATEMENT: Pt reports she has not been confined to office desk, "I've been moving today".   Was surprised that she was sore the next day after last session.  Her goal is to learn what she can do in pool, so that she can do it on own.    PERTINENT HISTORY:  DM, HTN, Obesity  PAIN:  Are you having pain? No  PRECAUTIONS: None  WEIGHT BEARING RESTRICTIONS: No  FALLS:  Has patient fallen in last 6 months? 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)  PLOF: Independent, Vocation/Vocational requirements: Prolonged computer work, and Leisure: play cards with friends, AA meetings/related activities  PATIENT GOALS: To be able to begin an exercise routine and get stronger  NEXT MD VISIT: Dr Leafy Ro November 16  OBJECTIVE:   DIAGNOSTIC FINDINGS:  Has has imaging in the past with Dr Nelva Bush that revealed DDD  PATIENT SURVEYS:  FOTO 50% (projected 58% by visit 12)  SCREENING FOR RED FLAGS: Bowel or bladder incontinence: No Spinal tumors: No Cauda equina syndrome: No Compression fracture: No Abdominal aneurysm: No  COGNITION: Overall cognitive status: Within functional limits for tasks assessed  SENSATION: WFL  MUSCLE LENGTH: Hamstrings: Tightness bilaterally  POSTURE: rounded shoulders, increased lumbar lordosis, and anterior pelvic tilt  PALPATION: No  tenderness to palpation, but does have some muscle spasms along lumbar multifidi  LUMBAR ROM:   Limited 25-50% with some pain if pushes past comfortable range  LOWER EXTREMITY ROM:     WFL  LOWER EXTREMITY MMT:    09/28/2022:  Hip strength of 4/5, otherwise WFL  LUMBAR SPECIAL TESTS:  Slump test: Negative  FUNCTIONAL TESTS:  5 times sit to stand: 12.3 sec with minimal use of UE  GAIT: Distance walked: >1000 ft Assistive device utilized: None Level of assistance: Complete Independence Comments: Pt reports that she walk at least 20 minutes before she is limited by pain  TODAY'S TREATMENT:   11/05/22: Pt seen for aquatic therapy today.  Treatment took place in water 3.25-4.75 ft in depth at the [facility]. Temp of water was 91.  Pt entered/exited the pool via stairs independently with bilat rail.  * walking forward/ backward without support * side stepping with shoulder add/abdct with rainbow hand floats * single knee to chest stretch against wall  * forward/backward walking 1 lap with single rainbow hand float at side under water * staggered stance with kick board push pull x 10 each  * very short range trunk rotation with 1/2 submerged kick board * plank with small range hip ext x 10 each leg * suspended on yellow noodle between legs:  cycling with breast stroke arms, reverse jumping jacks, cc ski * TrA set with short blue noodle pull down with isometric holds at various positions (elevator stops)   Pt requires the buoyancy and hydrostatic pressure of water for support, and to offload joints by unweighting joint load by at least 50 % in navel deep water and by at least 75-80% in chest to neck deep water.  Viscosity of the water is needed for resistance of strengthening. Water current perturbations provides challenge to standing balance requiring increased core activation.   10/22/22: Pt arrives for aquatic physical therapy. Treatment took place in 3.5-5.5  feet of water. Water temperature was 91 degrees F. Pt entered the pool via stairs, reciprocally with mild use of rails. Pt requires buoyancy of water for support and to offload joints with strengthening exercises.  Seated water bench with 75% submersion Pt performed seated LE AROM exercises 20x in all planes, concurrent discussion of water principles and how we would be using them. Pt verbally understood all. 75% depth water walking with yellow noodle for postural support 6 lengths in each direction. VC for core. Standing against wall for SKC stretch 3x 10 sec Bil, post pelvic tilt 10x 5 sec hold, hip 3 ways 15x Bil with UE balance support. High knee marching with noodle 4 lengths followed by horizontal decompression without any floatation x10 min.  DATE: 10/05/2022 Nustep level 3 x6 min with PT present to discuss status Seated marching and LAQ x10 reps bilat Manual Therapy:  pt in right sidelying using Addaday to left paraspinals, glutes/piriformis, TFL/IT band, and hamstrings Supine butterfly stretch 2x20 sec Lower trunk rotation x10 Supine posterior pelvic tilt 2x10 Supine hamstring stretch with strap 2x20 sec bilat Supine clamshells with blue loop 2x10 Shoulder press in decompression pose 2x10 Educated pt in using a tennis ball to assist with self trigger point release to piriformis, she was able to return demonstration Seated piriformis stretch 2x20 sec bilat   DATE: 09/28/2022 Reviewed HEP (see below)    PATIENT EDUCATION:  Education details: Pt issued HEP Person educated: Patient Education method: Explanation, Demonstration, and Handouts Education comprehension: verbalized understanding  HOME EXERCISE PROGRAM: Access Code: [email or web address] URL: [email or web address] Date: 10/05/2022 Prepared by: Shelby Dubin Menke  Exercises - Supine Lower Trunk Rotation  -  1 x daily - 7 x weekly - 2 sets - 5 reps - 5 sec hold - Supine Butterfly Groin Stretch  - 1 x daily - 7 x weekly - 1 sets - 5 reps - 15 - 20 sec hold - Supine Posterior Pelvic Tilt  - 1 x daily - 7 x weekly - 2 sets - 10 reps - Seated Hamstring Stretch  - 1 x daily - 7 x weekly - 2 sets - 2 reps - 20 sec hold - Seated March  - 1 x daily - 7 x weekly - 2 sets - 10 reps - Seated Long Arc Quad  - 1 x daily - 7 x weekly - 2 sets - 10 reps - Seated Piriformis Stretch  - 1 x daily - 7 x weekly - 2 sets - 2 reps - 20 sec hold  ASSESSMENT:  CLINICAL IMPRESSION: Pt tolerated aquatic exercises well, without increase in pain.  Utilized common equipment she may have access to after joining facility with pool, as she is interested in building an aquatic HEP that she can begin to do upon d/c from therapy.  Light resistance used and provided sufficient challenge.  She is able to float suspended on yellow noodle without difficulty.  Discussed self care after sessions and to expect exercise soreness.  Progressing towards established PT goals.   OBJECTIVE IMPAIRMENTS: decreased strength, increased muscle spasms, impaired flexibility, and pain.   ACTIVITY LIMITATIONS: carrying, lifting, bending, standing, and transfers  PARTICIPATION LIMITATIONS: community activity  PERSONAL FACTORS: Fitness, Time since onset of injury/illness/exacerbation, and 1-2 comorbidities: DM, obesity  are also affecting patient's functional outcome.   REHAB POTENTIAL: Good  CLINICAL DECISION MAKING: Stable/uncomplicated  EVALUATION COMPLEXITY: Low   GOALS: Goals reviewed with patient? No  SHORT TERM GOALS: Target date: 10/26/2022  Pt will be independent with initial HEP. Baseline: Goal status: MET  2.  Pt will report at least a 30% improvement in functional impairments. Baseline:  Goal status: INITIAL   LONG TERM GOALS: Target date: 11/19/2022  Pt will be independent with advanced HEP. Baseline:  Goal status:  INITIAL  2.  Pt will increase bilateral hip strength to at least 4+/5 to allow her to perform transfers with increased ease. Baseline:  Goal status: INITIAL  3.  Pt will improve FOTO to at least 58% to demonstrate improvements in functional mobility. Baseline:  Goal status: INITIAL  4.  Pt will report pain no greater than 2/10 during most functional tasks throughout the day. Baseline:  Goal status: INITIAL  5.  Pt will have a plan to transition  to [employer] vs [employer] or other gym for continued fitness following discharge from PT. Baseline:  Goal status: INITIAL   PLAN:  PT FREQUENCY: 1-2x/week  PT DURATION: 8 weeks  PLANNED INTERVENTIONS: Therapeutic exercises, Therapeutic activity, Neuromuscular re-education, Balance training, Gait training, Patient/Family education, Self Care, Joint mobilization, Joint manipulation, Stair training, Aquatic Therapy, Dry Needling, Electrical stimulation, Spinal manipulation, Spinal mobilization, Cryotherapy, Moist heat, Taping, Traction, Ultrasound, Ionotophoresis 12m/ml Dexamethasone, Manual therapy, and Re-evaluation.  PLAN FOR NEXT SESSION: Aquatic PT, assess and update HEP as indicated, aquatics, strengthening/core stability  JKerin Perna PTA 11/05/22 4:05 PM [facility] [address]GGlen [place] [address]Phone: [phone]  Fax:  [phone]

## 2022-11-08 ENCOUNTER — Other Ambulatory Visit (HOSPITAL_COMMUNITY): Payer: Self-pay

## 2022-11-08 ENCOUNTER — Other Ambulatory Visit: Payer: Self-pay

## 2022-11-08 NOTE — Progress Notes (Unsigned)
Chief Complaint:   OBESITY Gloria [name] is here to discuss her progress with her obesity treatment plan along with follow-up of her obesity related diagnoses. Gloria [name] is on the Category 2 Plan and states she is following her eating plan approximately 90% of the time. Gloria [name] states she is walking for 15-30 minutes 1 time per week.  Today's visit was #: 25 Starting weight: 274 lbs Starting date: 08/07/2021 Today's weight: 247 lbs Today's date: 10/28/2022 Total lbs lost to date: 27 Total lbs lost since last in-office visit: 2  Interim History: Gloria [name] continues to do well with weight loss even over Thanksgiving. She will have time off over Christmas and she is concerned that she will do more boredom snacking.   Subjective:   1. Type 2 diabetes mellitus with other specified complication, unspecified whether long term insulin use (HCC) Gloria [name] is doing well with her medications, and she denies nausea or vomiting, and she is working on her weight loss and polyphagia is still controlled.   2. Essential hypertension Gloria [name]'s blood pressure is elevated today, but her eyes are hurting a bit and this may be contributing.   Assessment/Plan:   1. Type 2 diabetes mellitus with other specified complication, unspecified whether long term insulin use (HCC) Gloria [name] will continue Mounjaro 15 mg once weekly, and we will refill for 1 month.   - tirzepatide (MOUNJARO) 15 MG/0.5ML Pen; Inject 15 mg into the skin once a week.  Dispense: 6 mL; Refill: 0  2. Essential hypertension Gloria [name] will continue with her diet and we will recheck her blood pressure at her next visit.   3. Obesity, Current BMI 41.2 Gloria [name] is currently in the action stage of change. As such, her goal is to continue with weight loss efforts. She has agreed to the Category 2 Plan.   100-200 calorie protein rich snack ideas handout was given.   Exercise goals: As is.   Behavioral modification strategies: increasing lean protein  intake.  Gloria [name] has agreed to follow-up with our clinic in 3 to 4 weeks. She was informed of the importance of frequent follow-up visits to maximize her success with intensive lifestyle modifications for her multiple health conditions.   Objective:   Blood pressure (!) 154/54, pulse 71, temperature 97.9 F (36.6 C), height 5' 5"  (1.651 m), weight 247 lb (112 kg), last menstrual period 04/25/2013, SpO2 97 %. Body mass index is 41.1 kg/m.  General: Cooperative, alert, well developed, in no acute distress. HEENT: Conjunctivae and lids unremarkable. Cardiovascular: Regular rhythm.  Lungs: Normal work of breathing. Neurologic: No focal deficits.   Lab Results  Component Value Date   CREATININE 0.58 07/14/2022   BUN 7 07/14/2022   NA 133 (L) 07/14/2022   K 4.3 07/14/2022   CL 97 07/14/2022   CO2 23 07/14/2022   Lab Results  Component Value Date   ALT 22 07/14/2022   AST 19 07/14/2022   ALKPHOS 79 07/14/2022   BILITOT <0.2 07/14/2022   Lab Results  Component Value Date   HGBA1C 5.6 07/14/2022   HGBA1C 5.6 12/28/2021   HGBA1C 6.2 (H) 08/07/2021   HGBA1C 6.3 (A) 05/20/2021   HGBA1C 5.7 (H) 09/19/2020   Lab Results  Component Value Date   INSULIN 41.2 (H) 07/14/2022   INSULIN 30.5 (H) 12/28/2021   INSULIN 29.5 (H) 08/07/2021   Lab Results  Component Value Date   TSH 2.860 08/07/2021   Lab Results  Component Value Date   CHOL 216 (H) 07/14/2022  HDL 44 07/14/2022   LDLCALC 139 (H) 07/14/2022   LDLDIRECT 145.0 07/18/2019   TRIG 185 (H) 07/14/2022   CHOLHDL 6.1 (H) 09/19/2020   Lab Results  Component Value Date   VD25OH 64.0 07/14/2022   VD25OH 60.0 12/28/2021   VD25OH 45.2 08/07/2021   Lab Results  Component Value Date   WBC 10.4 08/07/2021   HGB 13.2 08/07/2021   HCT 40.2 08/07/2021   MCV 89 08/07/2021   PLT 284 08/07/2021   Lab Results  Component Value Date   IRON 54 10/06/2017   TIBC 304 10/06/2017   FERRITIN 151 (H) 10/06/2017   Attestation  Statements:   Reviewed by clinician on day of visit: allergies, medications, problem list, medical history, surgical history, family history, social history, and previous encounter notes.   I, Trixie Dredge, am acting as transcriptionist for Dennard Nip, MD.  I have reviewed the above documentation for accuracy and completeness, and I agree with the above. -  Dennard Nip, MD

## 2022-11-09 ENCOUNTER — Encounter (INDEPENDENT_AMBULATORY_CARE_PROVIDER_SITE_OTHER): Payer: Self-pay | Admitting: Family Medicine

## 2022-11-09 ENCOUNTER — Ambulatory Visit (INDEPENDENT_AMBULATORY_CARE_PROVIDER_SITE_OTHER): Payer: 59 | Admitting: Obstetrics & Gynecology

## 2022-11-09 ENCOUNTER — Ambulatory Visit (INDEPENDENT_AMBULATORY_CARE_PROVIDER_SITE_OTHER): Payer: 59 | Admitting: Family Medicine

## 2022-11-09 ENCOUNTER — Encounter: Payer: Self-pay | Admitting: Obstetrics & Gynecology

## 2022-11-09 ENCOUNTER — Other Ambulatory Visit (HOSPITAL_COMMUNITY): Payer: Self-pay

## 2022-11-09 ENCOUNTER — Other Ambulatory Visit (HOSPITAL_COMMUNITY)
Admission: RE | Admit: 2022-11-09 | Discharge: 2022-11-09 | Disposition: A | Discharge status: Home / Self Care | Payer: 59 | Source: Ambulatory Visit | Attending: Obstetrics & Gynecology | Admitting: Obstetrics & Gynecology

## 2022-11-09 ENCOUNTER — Ambulatory Visit (HOSPITAL_BASED_OUTPATIENT_CLINIC_OR_DEPARTMENT_OTHER): Payer: 59 | Admitting: Physical Therapy

## 2022-11-09 VITALS — BP 114/74 | HR 71 | Temp 97.5°F | Ht 64.75 in | Wt 246.0 lb

## 2022-11-09 VITALS — HR 72 | Temp 98.1°F | Ht 65.0 in | Wt 244.0 lb

## 2022-11-09 DIAGNOSIS — Z79890 Hormone replacement therapy (postmenopausal): Secondary | ICD-10-CM | POA: Diagnosis not present

## 2022-11-09 DIAGNOSIS — R030 Elevated blood-pressure reading, without diagnosis of hypertension: Secondary | ICD-10-CM | POA: Diagnosis not present

## 2022-11-09 DIAGNOSIS — R3915 Urgency of urination: Secondary | ICD-10-CM

## 2022-11-09 DIAGNOSIS — Z1272 Encounter for screening for malignant neoplasm of vagina: Secondary | ICD-10-CM | POA: Insufficient documentation

## 2022-11-09 DIAGNOSIS — Z01419 Encounter for gynecological examination (general) (routine) without abnormal findings: Secondary | ICD-10-CM | POA: Diagnosis present

## 2022-11-09 DIAGNOSIS — E1169 Type 2 diabetes mellitus with other specified complication: Secondary | ICD-10-CM

## 2022-11-09 DIAGNOSIS — Z7985 Long-term (current) use of injectable non-insulin antidiabetic drugs: Secondary | ICD-10-CM

## 2022-11-09 DIAGNOSIS — Z90710 Acquired absence of both cervix and uterus: Secondary | ICD-10-CM | POA: Diagnosis not present

## 2022-11-09 DIAGNOSIS — E669 Obesity, unspecified: Secondary | ICD-10-CM

## 2022-11-09 DIAGNOSIS — Z6841 Body Mass Index (BMI) 40.0 and over, adult: Secondary | ICD-10-CM

## 2022-11-09 DIAGNOSIS — E66813 Obesity, class 3: Secondary | ICD-10-CM

## 2022-11-09 LAB — URINALYSIS, COMPLETE W/RFL CULTURE
Bacteria, UA: NONE SEEN /HPF
Bilirubin Urine: NEGATIVE
Casts: NONE SEEN /LPF
Glucose, UA: NEGATIVE
Hgb urine dipstick: NEGATIVE
Hyaline Cast: NONE SEEN /LPF
Ketones, ur: NEGATIVE
Leukocyte Esterase: NEGATIVE
Nitrites, Initial: NEGATIVE
Protein, ur: NEGATIVE
RBC / HPF: NONE SEEN /HPF (ref 0–2)
Specific Gravity, Urine: 1.02 (ref 1.001–1.035)
WBC, UA: NONE SEEN /HPF (ref 0–5)
Yeast: NONE SEEN /HPF
pH: 7 (ref 5.0–8.0)

## 2022-11-09 LAB — NO CULTURE INDICATED

## 2022-11-09 MED ORDER — TIRZEPATIDE 15 MG/0.5ML ~~LOC~~ SOAJ: 15.0000 mg | SUBCUTANEOUS | 0 refills | Status: DC

## 2022-11-09 MED ORDER — ESTRADIOL 0.05 MG/24HR TD PTTW: 1.0000 | MEDICATED_PATCH | TRANSDERMAL | 4 refills | Status: DC

## 2022-11-09 NOTE — Addendum Note (Signed)
Addended by: Susy Manor on: 11/09/2022 02:14 PM   Modules accepted: Orders

## 2022-11-09 NOTE — Progress Notes (Signed)
Gloria [name] Feb 08, 1971 [id number]   History:    51 y.o. G1P1L1  Divorced   RP:  Established patient presenting for annual gyn exam    HPI: Postmenopause, well on Estradiol patch 0.05 twice weekly x 4 years.  S/P Total Hysterectomy, Left Oophorectomy, partial Right Oophorectomy.  No pelvic pain.  Abstinent.  Pap Neg 09/2017.  Breasts wnl. Mammo Neg 10/2022.  Treated a UTI until 11/04/22.  Urinary urgency now.  BMI improved to 41.25, currently [facility]. Increased fitness activities.  Has DM type 2.  Health Labs with Fam MD. Harriet Masson done in 2021.  Flu vaccine at pharmacy.   Past medical history,surgical history, family history and social history were all reviewed and documented in the EPIC chart.  Gynecologic History Patient's last menstrual period was 04/25/2013.  Obstetric History OB History  Gravida Para Term Preterm AB Living  1 1 1     1   SAB IAB Ectopic Multiple Live Births          1    # Outcome Date GA Lbr Len/2nd Weight Sex Delivery Anes PTL Lv  1 Term     M Vag-Spont  N LIV     ROS: A ROS was performed and pertinent positives and negatives are included in the history. GENERAL: No fevers or chills. HEENT: No change in vision, no earache, sore throat or sinus congestion. NECK: No pain or stiffness. CARDIOVASCULAR: No chest pain or pressure. No palpitations. PULMONARY: No shortness of breath, cough or wheeze. GASTROINTESTINAL: No abdominal pain, nausea, vomiting or diarrhea, melena or bright red blood per rectum. GENITOURINARY: No urinary frequency, urgency, hesitancy or dysuria. MUSCULOSKELETAL: No joint or muscle pain, no back pain, no recent trauma. DERMATOLOGIC: No rash, no itching, no lesions. ENDOCRINE: No polyuria, polydipsia, no heat or cold intolerance. No recent change in weight. HEMATOLOGICAL: No anemia or easy bruising or bleeding. NEUROLOGIC: No headache, seizures, numbness, tingling or weakness. PSYCHIATRIC: No depression, no  loss of interest in normal activity or change in sleep pattern.     Exam:   BP 114/74   Pulse 71   Temp (!) 97.5 F (36.4 C) (Oral)   Ht 5' 4.75" (1.645 m)   Wt 246 lb (111.6 kg)   LMP 04/25/2013   SpO2 98%   BMI 41.25 kg/m   Body mass index is 41.25 kg/m.  General appearance : Well developed well nourished female. No acute distress HEENT: Eyes: no retinal hemorrhage or exudates,  Neck supple, trachea midline, no carotid bruits, no thyroidmegaly Lungs: Clear to auscultation, no rhonchi or wheezes, or rib retractions  Heart: Regular rate and rhythm, no murmurs or gallops Breast:Examined in sitting and supine position were symmetrical in appearance, no palpable masses or tenderness,  no skin retraction, no nipple inversion, no nipple discharge, no skin discoloration, no axillary or supraclavicular lymphadenopathy Abdomen: no palpable masses or tenderness, no rebound or guarding Extremities: no edema or skin discoloration or tenderness  Pelvic: Vulva: Normal             Vagina: No gross lesions or discharge.  Pap reflex done.  Cervix/Uterus absent  Adnexa  Without masses or tenderness  Anus: Normal  U/A Yellow cloudy, Nit Neg, Pro Neg, WBC Neg, RBC Neg, Bacteria Neg, many amorphous crystals.   Assessment/Plan:  51 y.o. female for annual exam   1. Encounter for Papanicolaou smear of vagina as part of routine gynecological examination Postmenopause, well on Estradiol patch 0.05 twice  weekly x 4 years.  S/P Total Hysterectomy, Left Oophorectomy, partial Right Oophorectomy.  No pelvic pain.  Abstinent.  Pap Neg 09/2017.  Breasts wnl. Mammo Neg 10/2022.  Treated a UTI until 11/04/22.  Urinary urgency currently.  BMI improved to 41.25, currently lo[facility] weight at the [facility].  Increased fitness activities.  Has DM type 2.  Health Labs with Fam MD. Harriet Masson done in 2021.  Flu vaccine at pharmacy. - Cytology - PAP( [facility])  2. H/O total hysterectomy  3.  Postmenopausal hormone replacement therapy Postmenopause, well on Estradiol patch 0.05 twice weekly x 4 years.  S/P Total Hysterectomy, Left Oophorectomy, partial Right Oophorectomy.  No pelvic pain.  Abstinent.    4. Urinary urgency U/A normal except for amorphous crystals.  Will recheck Urine with Endocrino.  5. Class 3 severe obesity due to excess calories with serious comorbidity and body mass index (BMI) of 40.0 to 44.9 in adult ([place]) BMI improved to 41.25, currently lo[facility] weight at the [facility].  Other orders - estradiol (VIVELLE-DOT) 0.05 MG/24HR patch; Place 1 patch (0.05 mg total) onto the skin 2 (two) times a week.   Princess Bruins MD, 1:37 PM

## 2022-11-10 ENCOUNTER — Other Ambulatory Visit: Payer: Self-pay

## 2022-11-11 ENCOUNTER — Encounter: Payer: 59 | Admitting: Physical Therapy

## 2022-11-11 LAB — CYTOLOGY - PAP: Diagnosis: NEGATIVE

## 2022-11-12 ENCOUNTER — Ambulatory Visit (HOSPITAL_BASED_OUTPATIENT_CLINIC_OR_DEPARTMENT_OTHER): Payer: 59 | Admitting: Physical Therapy

## 2022-11-12 ENCOUNTER — Encounter (HOSPITAL_BASED_OUTPATIENT_CLINIC_OR_DEPARTMENT_OTHER): Payer: Self-pay | Admitting: Physical Therapy

## 2022-11-12 DIAGNOSIS — R252 Cramp and spasm: Secondary | ICD-10-CM

## 2022-11-12 DIAGNOSIS — M5459 Other low back pain: Secondary | ICD-10-CM

## 2022-11-12 DIAGNOSIS — M545 Low back pain, unspecified: Secondary | ICD-10-CM | POA: Diagnosis not present

## 2022-11-12 DIAGNOSIS — M6281 Muscle weakness (generalized): Secondary | ICD-10-CM

## 2022-11-12 NOTE — Therapy (Signed)
OUTPATIENT PHYSICAL THERAPY TREATMENT NOTE   Patient Name: Gloria [name] MRN: [id number] DOB:1971-07-30, 51 y.o., female Today's Date: 11/12/2022   PT End of Session - 11/12/22 1141     Visit Number 5    Date for PT Re-Evaluation 11/19/22    Authorization Type [employer]    PT Start Time 0945    PT Stop Time 1030    PT Time Calculation (min) 45 min    Activity Tolerance Patient tolerated treatment well    Behavior During Therapy WFL for tasks assessed/performed              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   DDD (degenerative disc disease), lumbar    Depression    Diabetes ([place])    Diverticulosis    Drug use    Elevated LFTs    Fibromyalgia    GERD (gastroesophageal reflux disease)    History of ETOH abuse    Hyperlipidemia    Hyperplastic colon polyp    Internal hemorrhoids    Migraines    NAFLD (nonalcoholic fatty liver disease)    Obese    Obstructive sleep apnea 02/25/2015   not diagnosed per pt 04-03-20   Osteoarthritis    lower back- DDD   Periodic limb movement disorder 02/25/2014   Pre-diabetes    pt taking metformin   RLS (restless legs syndrome)    Sleep apnea    Smoker    Snoring    Substance abuse ([place])    Swallowing difficulty    Vitamin B12 deficiency    Vitamin D deficiency    Past Surgical History:  Procedure Laterality Date   CERVICAL FUSION     COLONOSCOPY     CYSTOSCOPY  06/06/2013   Procedure: CYSTOSCOPY;  Surgeon: Terrance Mass, MD;  Location: [facility];  Service: Gynecology;;   EXCISION METACARPAL MASS Right 09/03/2021   Procedure: EXCISION METACARPAL MASS RIGHT SMALL FINGER;  Surgeon: Daryll Brod, MD;   Location: [facility];  Service: Orthopedics;  Laterality: Right;   INTRAUTERINE DEVICE INSERTION  08/09/2008   PARAGUARD- removed   kidney surgery  1995   abcess   LAPAROSCOPY N/A 06/06/2013   Procedure: LAPAROSCOPY DIAGNOSTIC;  Surgeon: Terrance Mass, MD;  Location: [facility];  Service: Gynecology;  Laterality: N/A;   LAPAROTOMY  06/06/2013   Procedure: EXPLORATORY LAPAROTOMY;  Surgeon: Terrance Mass, MD;  Location: [facility];  Service: Gynecology;;   LIVER BIOPSY     radial tunnel Bilateral 2005   VAGINAL HYSTERECTOMY Left 06/06/2013   Procedure: Vaginal Hysterectomy with Patiial Right Salpingectomy;  Surgeon: Terrance Mass, MD;  Location: [facility];  Service: Gynecology;  Laterality: Left;   Patient Active Problem List   Diagnosis Date Noted   Low back pain 10/07/2022   Pre-hypertension 08/05/2022   Mixed hyperlipidemia 07/14/2022   Stress 06/09/2022   Essential hypertension 05/03/2022   At risk for heart disease 05/03/2022   Vitamin D deficiency 04/07/2022   At risk for hypoglycemia 04/07/2022   Non-restorative  sleep 10/25/2020   Gasping for breath 09/23/2020   Class 3 severe obesity with serious comorbidity and body mass index (BMI) of 45.0 to 49.9 in adult [facility]) 09/23/2020   Hypersomnia, recurrent 09/23/2020   MDD (recurrent major depressive disorder) in remission ([place]) 09/23/2020   Diabetes mellitus ([place]) 04/06/2019   Chronic back pain 02/25/2015   Periodic limb movement disorder 02/25/2014   Restless legs syndrome 12/15/2013   Cardiac murmur 10/16/2013   Snoring 10/16/2013   Hx of migraine headaches 10/16/2013   Fluid retention 10/12/2013   Weight gain 10/12/2013   Anxiety 54/65/0354   Alcoholic liver disease ([place]) 10/28/2012   Ex-cigarette smoker 10/28/2012   Fibromyalgia 10/27/2012   Depression with anxiety 10/27/2012   GERD (gastroesophageal reflux disease) 10/27/2012   H/O ETOH abuse 10/27/2012    PCP: Billie Ruddy, MD  REFERRING PROVIDER:  Starlyn Skeans, MD  REFERRING DIAG: M54.50,G89.29 (ICD-10-CM) - Chronic bilateral low back pain without sciatica  Rationale for Evaluation and Treatment: Rehabilitation  THERAPY DIAG:  Other low back pain  Muscle weakness (generalized)  Cramp and spasm  ONSET DATE: Approx 14 years ago  SUBJECTIVE:                                                                                                                                                                                           SUBJECTIVE STATEMENT: Pt reports she has been sitting too much at work.  Did have some discomfort after last session but expected it.  PERTINENT HISTORY:  DM, HTN, Obesity  PAIN:  Are you having pain? No  PRECAUTIONS: None  WEIGHT BEARING RESTRICTIONS: No  FALLS:  Has patient fallen in last 6 months? 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)  PLOF: Independent, Vocation/Vocational requirements: Prolonged computer work, and Leisure: play cards with friends, AA meetings/related activities  PATIENT GOALS: To be able to begin an exercise routine and get stronger  NEXT MD VISIT: Dr Leafy Ro November 16  OBJECTIVE:   DIAGNOSTIC FINDINGS:  Has has imaging in the past with Dr Nelva Bush that revealed DDD  PATIENT SURVEYS:  FOTO 50% (projected 58% by visit 12)  SCREENING FOR RED FLAGS: Bowel or bladder incontinence: No Spinal tumors: No Cauda equina syndrome: No Compression fracture: No Abdominal aneurysm: No  COGNITION: Overall cognitive status: Within functional limits for tasks assessed     SENSATION: WFL  MUSCLE LENGTH: Hamstrings: Tightness bilaterally  POSTURE: rounded shoulders, increased lumbar  lordosis, and anterior pelvic tilt  PALPATION: No tenderness to  palpation, but does have some muscle spasms along lumbar multifidi  LUMBAR ROM:   Limited 25-50% with some pain if pushes past comfortable range  LOWER EXTREMITY ROM:     WFL  LOWER EXTREMITY MMT:    09/28/2022:  Hip strength of 4/5, otherwise WFL  LUMBAR SPECIAL TESTS:  Slump test: Negative  FUNCTIONAL TESTS:  5 times sit to stand: 12.3 sec with minimal use of UE  GAIT: Distance walked: >1000 ft Assistive device utilized: None Level of assistance: Complete Independence Comments: Pt reports that she walk at least 20 minutes before she is limited by pain  TODAY'S TREATMENT:   11/05/22: Pt seen for aquatic therapy today.  Treatment took place in water 3.25-4.75 ft in depth at the [facility]. Temp of water was 91.  Pt entered/exited the pool via stairs independently with bilat rail.  * walking forward/ backward without support * side stepping with shoulder add/abdct with rainbow hand floats * single knee to chest stretch against wall  * forward/backward walking 1 lap with single rainbow/yellow hand float at side under water * staggered stance then wide stance with kick board push pull x 15 each. Cues for increased speed  * plank with small range hip ext x 10 each leg * suspended on yellow noodle between legs:  cycling with breast stroke arms, reverse jumping jacks, cc ski * TrA set with short blue noodle pull down with isometric holds at various positions (elevator stops)   Pt requires the buoyancy and hydrostatic pressure of water for support, and to offload joints by unweighting joint load by at least 50 % in navel deep water and by at least 75-80% in chest to neck deep water.  Viscosity of the water is needed for resistance of strengthening. Water current perturbations provides challenge to standing balance requiring increased core activation.   10/22/22: Pt arrives for aquatic physical therapy. Treatment took place in 3.5-5.5 feet of water. Water temperature  was 91 degrees F. Pt entered the pool via stairs, reciprocally with mild use of rails. Pt requires buoyancy of water for support and to offload joints with strengthening exercises.  Seated water bench with 75% submersion Pt performed seated LE AROM exercises 20x in all planes, concurrent discussion of water principles and how we would be using them. Pt verbally understood all. 75% depth water walking with yellow noodle for postural support 6 lengths in each direction. VC for core. Standing against wall for SKC stretch 3x 10 sec Bil, post pelvic tilt 10x 5 sec hold, hip 3 ways 15x Bil with UE balance support. High knee marching with noodle 4 lengths followed by horizontal decompression without any floatation x10 min.                                                                                                                           DATE: 10/05/2022 Nustep level 3 x6 min with  PT present to discuss status Seated marching and LAQ x10 reps bilat Manual Therapy:  pt in right sidelying using [employer] to left paraspinals, glutes/piriformis, TFL/IT band, and hamstrings Supine butterfly stretch 2x20 sec Lower trunk rotation x10 Supine posterior pelvic tilt 2x10 Supine hamstring stretch with strap 2x20 sec bilat Supine clamshells with blue loop 2x10 Shoulder press in decompression pose 2x10 Educated pt in using a tennis ball to assist with self trigger point release to piriformis, she was able to return demonstration Seated piriformis stretch 2x20 sec bilat   DATE: 09/28/2022 Reviewed HEP (see below)    PATIENT EDUCATION:  Education details: Pt issued HEP Person educated: Patient Education method: Explanation, Demonstration, and Handouts Education comprehension: verbalized understanding  HOME EXERCISE PROGRAM: Access Code: [email or web address] URL: [email or web address] Date: 10/05/2022 Prepared by: Shelby Dubin Menke  Exercises - Supine Lower Trunk Rotation  - 1 x daily - 7 x weekly - 2 sets -  5 reps - 5 sec hold - Supine Butterfly Groin Stretch  - 1 x daily - 7 x weekly - 1 sets - 5 reps - 15 - 20 sec hold - Supine Posterior Pelvic Tilt  - 1 x daily - 7 x weekly - 2 sets - 10 reps - Seated Hamstring Stretch  - 1 x daily - 7 x weekly - 2 sets - 2 reps - 20 sec hold - Seated March  - 1 x daily - 7 x weekly - 2 sets - 10 reps - Seated Long Arc Quad  - 1 x daily - 7 x weekly - 2 sets - 10 reps - Seated Piriformis Stretch  - 1 x daily - 7 x weekly - 2 sets - 2 reps - 20 sec hold  ASSESSMENT:  CLINICAL IMPRESSION: Pt with some discomfort after last session as to be expected.  She is directed through session mindful in regards to establishing aquatic HEP as pt has 1 session remaining.  DC planning includes her gaining acces to pool either at [facility] or here at [facility].  Laminated HEP will be prepared and issued next session. She tolerates today session with some discomfort although is resolved with decompression cycling on noodle. Continued with light resistance as it continues to provide adequate challenge.  Goals ongoing   OBJECTIVE IMPAIRMENTS: decreased strength, increased muscle spasms, impaired flexibility, and pain.   ACTIVITY LIMITATIONS: carrying, lifting, bending, standing, and transfers  PARTICIPATION LIMITATIONS: community activity  PERSONAL FACTORS: Fitness, Time since onset of injury/illness/exacerbation, and 1-2 comorbidities: DM, obesity  are also affecting patient's functional outcome.   REHAB POTENTIAL: Good  CLINICAL DECISION MAKING: Stable/uncomplicated  EVALUATION COMPLEXITY: Low   GOALS: Goals reviewed with patient? No  SHORT TERM GOALS: Target date: 10/26/2022  Pt will be independent with initial HEP. Baseline: Goal status: MET  2.  Pt will report at least a 30% improvement in functional impairments. Baseline:  Goal status: INITIAL   LONG TERM GOALS: Target date: 11/19/2022  Pt will be independent with advanced HEP. Baseline:  Goal status:  INITIAL  2.  Pt will increase bilateral hip strength to at least 4+/5 to allow her to perform transfers with increased ease. Baseline:  Goal status: INITIAL  3.  Pt will improve FOTO to at least 58% to demonstrate improvements in functional mobility. Baseline:  Goal status: INITIAL  4.  Pt will report pain no greater than 2/10 during most functional tasks throughout the day. Baseline:  Goal status: INITIAL  5.  Pt will have a plan to transition to [facility]  vs [employer] or other gym for continued fitness following discharge from PT. Baseline:  Goal status: INITIAL   PLAN:  PT FREQUENCY: 1-2x/week  PT DURATION: 8 weeks  PLANNED INTERVENTIONS: Therapeutic exercises, Therapeutic activity, Neuromuscular re-education, Balance training, Gait training, Patient/Family education, Self Care, Joint mobilization, Joint manipulation, Stair training, Aquatic Therapy, Dry Needling, Electrical stimulation, Spinal manipulation, Spinal mobilization, Cryotherapy, Moist heat, Taping, Traction, Ultrasound, Ionotophoresis 66m/ml Dexamethasone, Manual therapy, and Re-evaluation.  PLAN FOR NEXT SESSION: Aquatic PT, assess and update HEP as indicated, aquatics, strengthening/core stability  JKerin Perna PTA 11/12/22 11:42 AM [facility]acioRe[facility] Services [address] [place] [address]Phone: [phone]  Fax:  [phone]

## 2022-11-16 ENCOUNTER — Encounter: Payer: 59 | Admitting: Rehabilitative and Restorative Service Providers"

## 2022-11-16 ENCOUNTER — Encounter (HOSPITAL_BASED_OUTPATIENT_CLINIC_OR_DEPARTMENT_OTHER): Payer: Self-pay | Admitting: Physical Therapy

## 2022-11-16 ENCOUNTER — Ambulatory Visit (HOSPITAL_BASED_OUTPATIENT_CLINIC_OR_DEPARTMENT_OTHER): Payer: 59 | Admitting: Physical Therapy

## 2022-11-16 DIAGNOSIS — M545 Low back pain, unspecified: Secondary | ICD-10-CM | POA: Diagnosis not present

## 2022-11-16 DIAGNOSIS — M5459 Other low back pain: Secondary | ICD-10-CM

## 2022-11-16 DIAGNOSIS — M6281 Muscle weakness (generalized): Secondary | ICD-10-CM

## 2022-11-16 DIAGNOSIS — R252 Cramp and spasm: Secondary | ICD-10-CM

## 2022-11-16 NOTE — Therapy (Signed)
OUTPATIENT PHYSICAL THERAPY TREATMENT NOTE   Patient Name: Gloria [name] MRN: [id number] DOB:03/10/1971, 51 y.o., female Today's Date: 11/16/2022   PT End of Session - 11/16/22 1550     Visit Number 6    Date for PT Re-Evaluation 11/19/22    Authorization Type [employer]    PT Start Time 1446    PT Stop Time 1530    PT Time Calculation (min) 44 min    Activity Tolerance Patient tolerated treatment well    Behavior During Therapy WFL for tasks assessed/performed              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   DDD (degenerative disc disease), lumbar    Depression    Diabetes (HCC)    Diverticulosis    Drug use    Elevated LFTs    Fibromyalgia    GERD (gastroesophageal reflux disease)    History of ETOH abuse    Hyperlipidemia    Hyperplastic colon polyp    Internal hemorrhoids    Migraines    NAFLD (nonalcoholic fatty liver disease)    Obese    Obstructive sleep apnea 02/25/2015   not diagnosed per pt 04-03-20   Osteoarthritis    lower back- DDD   Periodic limb movement disorder 02/25/2014   Pre-diabetes    pt taking metformin   RLS (restless legs syndrome)    Sleep apnea    Smoker    Snoring    Substance abuse (HCC)    Swallowing difficulty    Vitamin B12 deficiency    Vitamin D deficiency    Past Surgical History:  Procedure Laterality Date   CERVICAL FUSION     COLONOSCOPY     CYSTOSCOPY  06/06/2013   Procedure: CYSTOSCOPY;  Surgeon: Juan H Fernandez, MD;  Location: [facility];  Service: Gynecology;;   EXCISION METACARPAL MASS Right 09/03/2021   Procedure: EXCISION METACARPAL MASS RIGHT SMALL FINGER;  Surgeon: Kuzma, Gary, MD;   Location: [facility];  Service: Orthopedics;  Laterality: Right;   INTRAUTERINE DEVICE INSERTION  08/09/2008   PARAGUARD- removed   kidney surgery  1995   abcess   LAPAROSCOPY N/A 06/06/2013   Procedure: LAPAROSCOPY DIAGNOSTIC;  Surgeon: Juan H Fernandez, MD;  Location: [facility];  Service: Gynecology;  Laterality: N/A;   LAPAROTOMY  06/06/2013   Procedure: EXPLORATORY LAPAROTOMY;  Surgeon: Juan H Fernandez, MD;  Location: [facility];  Service: Gynecology;;   LIVER BIOPSY     radial tunnel Bilateral 2005   VAGINAL HYSTERECTOMY Left 06/06/2013   Procedure: Vaginal Hysterectomy with Patiial Right Salpingectomy;  Surgeon: Juan H Fernandez, MD;  Location: [facility];  Service: Gynecology;  Laterality: Left;   Patient Active Problem List   Diagnosis Date Noted   Low back pain 10/07/2022   Pre-hypertension 08/05/2022   Mixed hyperlipidemia 07/14/2022   Stress 06/09/2022   Essential hypertension 05/03/2022   At risk for heart disease 05/03/2022   Vitamin D deficiency 04/07/2022   At risk for hypoglycemia 04/07/2022   Non-restorative   sleep 10/25/2020   Gasping for breath 09/23/2020   Class 3 severe obesity with serious comorbidity and body mass index (BMI) of 45.0 to 49.9 in adult (HCC) 09/23/2020   Hypersomnia, recurrent 09/23/2020   MDD (recurrent major depressive disorder) in remission (HCC) 09/23/2020   Diabetes mellitus (HCC) 04/06/2019   Chronic back pain 02/25/2015   Periodic limb movement disorder 02/25/2014   Restless legs syndrome 12/15/2013   Cardiac murmur 10/16/2013   Snoring 10/16/2013   Hx of migraine headaches 10/16/2013   Fluid retention 10/12/2013   Weight gain 10/12/2013   Anxiety 10/12/2013   Alcoholic liver disease (HCC) 10/28/2012   Ex-cigarette smoker 10/28/2012   Fibromyalgia 10/27/2012   Depression with anxiety 10/27/2012   GERD (gastroesophageal reflux disease) 10/27/2012   H/O ETOH abuse 10/27/2012    PCP: Banks, Shannon R, MD  REFERRING PROVIDER:  Beasley, Caren D, MD  REFERRING DIAG: M54.50,G89.29 (ICD-10-CM) - Chronic bilateral low back pain without sciatica  Rationale for Evaluation and Treatment: Rehabilitation  THERAPY DIAG:  Other low back pain  Muscle weakness (generalized)  Cramp and spasm  ONSET DATE: Approx 14 years ago  SUBJECTIVE:                                                                                                                                                                                           SUBJECTIVE STATEMENT: Pt reports right foot pain after last session.  Has had torn ligaments in past in that foot  PERTINENT HISTORY:  DM, HTN, Obesity  PAIN:  Are you having pain? No  PRECAUTIONS: None  WEIGHT BEARING RESTRICTIONS: No  FALLS:  Has patient fallen in last 6 months? 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)  PLOF: Independent, Vocation/Vocational requirements: Prolonged computer work, and Leisure: play cards with friends, AA meetings/related activities  PATIENT GOALS: To be able to begin an exercise routine and get stronger  NEXT MD VISIT: Dr Beasley November 16  OBJECTIVE:   DIAGNOSTIC FINDINGS:  Has has imaging in the past with Dr Ramos that revealed DDD  PATIENT SURVEYS:  FOTO 50% (projected 58% by visit 12)  SCREENING FOR RED FLAGS: Bowel or bladder incontinence: No Spinal tumors: No Cauda equina syndrome: No Compression fracture: No Abdominal aneurysm: No  COGNITION: Overall cognitive status: Within functional limits for tasks assessed     SENSATION: WFL  MUSCLE LENGTH: Hamstrings: Tightness bilaterally  POSTURE: rounded shoulders, increased lumbar lordosis, and anterior   pelvic tilt  PALPATION: No tenderness to palpation, but does have  some muscle spasms along lumbar multifidi  LUMBAR ROM:   Limited 25-50% with some pain if pushes past comfortable range  LOWER EXTREMITY ROM:     WFL  LOWER EXTREMITY MMT:    09/28/2022:  Hip strength of 4/5, otherwise WFL  LUMBAR SPECIAL TESTS:  Slump test: Negative  FUNCTIONAL TESTS:  5 times sit to stand: 12.3 sec with minimal use of UE  GAIT: Distance walked: >1000 ft Assistive device utilized: None Level of assistance: Complete Independence Comments: Pt reports that she walk at least 20 minutes before she is limited by pain  TODAY'S TREATMENT:   11/16/22: Pt seen for aquatic therapy today.  Treatment took place in water 3.25-4.75 ft in depth at the [place]. Temp of water was 91.  Pt entered/exited the pool via stairs independently with bilat rail.  * walking forward/ backward without support * side stepping with shoulder add/abdct with rainbow hand floats * piriformis stretch; L stretch with tail wagging *Hip hinge * forward/backward walking 1 lap with single rainbow/yellow hand float at side under water * staggered stance then wide stance with kick board push pull x 15 each. Cues for increased speed  * suspended on yellow noodle between legs:  cycling with breast stroke arms, add/abd, cc ski * TrA set with short blue noodle pull down with isometric holds at various positions (elevator stops)   Pt requires the buoyancy and hydrostatic pressure of water for support, and to offload joints by unweighting joint load by at least 50 % in navel deep water and by at least 75-80% in chest to neck deep water.  Viscosity of the water is needed for resistance of strengthening. Water current perturbations provides challenge to standing balance requiring increased core activation.   10/22/22: Pt arrives for aquatic physical therapy. Treatment took place in 3.5-5.5 feet of water. Water temperature was 91 degrees F. Pt entered the pool via stairs, reciprocally with  mild use of rails. Pt requires buoyancy of water for support and to offload joints with strengthening exercises.  Seated water bench with 75% submersion Pt performed seated LE AROM exercises 20x in all planes, concurrent discussion of water principles and how we would be using them. Pt verbally understood all. 75% depth water walking with yellow noodle for postural support 6 lengths in each direction. VC for core. Standing against wall for SKC stretch 3x 10 sec Bil, post pelvic tilt 10x 5 sec hold, hip 3 ways 15x Bil with UE balance support. High knee marching with noodle 4 lengths followed by horizontal decompression without any floatation x10 min.                                                                                                                           DATE: 10/05/2022 Nustep level 3 x6 min with PT present to discuss status Seated marching and LAQ x10 reps bilat Manual Therapy:  pt in right sidelying using Add[employer] to left paraspinals, glutes/piriformis, TFL/IT band, and hamstrings Supine butterfly stretch 2x20 sec Lower trunk rotation x10 Supine posterior pelvic tilt 2x10 Supine hamstring stretch with strap 2x20 sec bilat Supine clamshells with blue loop 2x10 Shoulder press in decompression pose 2x10 Educated pt in using a tennis ball to assist with self trigger point release to piriformis, she was able to return demonstration Seated piriformis stretch 2x20 sec bilat   DATE: 09/28/2022 Reviewed HEP (see below)    PATIENT EDUCATION:  Education details: Pt issued HEP Person educated: Patient Education method: Explanation, Demonstration, and Handouts Education comprehension: verbalized understanding  HOME EXERCISE PROGRAM: Access Code: [email or web address] URL: [email or web address] Date: 10/05/2022 Prepared by: Shelby Dubin Menke  Exercises - Supine Lower Trunk Rotation  - 1 x daily - 7 x weekly - 2 sets - 5 reps - 5 sec hold - Supine Butterfly Groin Stretch  - 1 x daily  - 7 x weekly - 1 sets - 5 reps - 15 - 20 sec hold - Supine Posterior Pelvic Tilt  - 1 x daily - 7 x weekly - 2 sets - 10 reps - Seated Hamstring Stretch  - 1 x daily - 7 x weekly - 2 sets - 2 reps - 20 sec hold - Seated March  - 1 x daily - 7 x weekly - 2 sets - 10 reps - Seated Long Arc Quad  - 1 x daily - 7 x weekly - 2 sets - 10 reps - Seated Piriformis Stretch  - 1 x daily - 7 x weekly - 2 sets - 2 reps - 20 sec hold  ASSESSMENT:  CLINICAL IMPRESSION: Final aquatic HEP complete, laminated and issued to pt. Pt directed through to ensure understanding and indep with program.  Exercises clarified; minimal vc and demonstration given for proper execution. She is slightly limited today with right foot pain although we are able to modify exercises to her toleration.  Eliminated plank which may have been cause of pain.  She is in process of getting membership here at [facility] to be able to continue with aquatic HEP.  This is last appointment here in aquatics.    OBJECTIVE IMPAIRMENTS: decreased strength, increased muscle spasms, impaired flexibility, and pain.   ACTIVITY LIMITATIONS: carrying, lifting, bending, standing, and transfers  PARTICIPATION LIMITATIONS: community activity  PERSONAL FACTORS: Fitness, Time since onset of injury/illness/exacerbation, and 1-2 comorbidities: DM, obesity  are also affecting patient's functional outcome.   REHAB POTENTIAL: Good  CLINICAL DECISION MAKING: Stable/uncomplicated  EVALUATION COMPLEXITY: Low   GOALS: Goals reviewed with patient? No  SHORT TERM GOALS: Target date: 10/26/2022  Pt will be independent with initial HEP. Baseline: Goal status: MET  2.  Pt will report at least a 30% improvement in functional impairments. Baseline:  Goal status: INITIAL   LONG TERM GOALS: Target date: 11/19/2022  Pt will be independent with advanced HEP. Baseline:  Goal status: Ongoing (pt indep with final aquatic HEP 12/26)  2.  Pt will increase  bilateral hip strength to at least 4+/5 to allow her to perform transfers with increased ease. Baseline:  Goal status: INITIAL  3.  Pt will improve FOTO to at least 58% to demonstrate improvements in functional mobility. Baseline:  Goal status: INITIAL  4.  Pt will report pain no greater than 2/10 during most functional tasks throughout the day. Baseline:  Goal status: INITIAL  5.  Pt will have a plan to transition to [place] vs [employer] or other gym for  continued fitness following discharge from PT. Baseline:  Goal status: INITIAL   PLAN:  PT FREQUENCY: 1-2x/week  PT DURATION: 8 weeks  PLANNED INTERVENTIONS: Therapeutic exercises, Therapeutic activity, Neuromuscular re-education, Balance training, Gait training, Patient/Family education, Self Care, Joint mobilization, Joint manipulation, Stair training, Aquatic Therapy, Dry Needling, Electrical stimulation, Spinal manipulation, Spinal mobilization, Cryotherapy, Moist heat, Taping, Traction, Ultrasound, Ionotophoresis 4mg/ml Dexamethasone, Manual therapy, and Re-evaluation.  PLAN FOR NEXT SESSION: Aquatic PT, assess and update HEP as indicated, aquatics, strengthening/core stability   (Frankie)  MPT [facility] [address] [place], [place], [address] Phone: [phone]   Fax:  [phone]     

## 2022-11-18 ENCOUNTER — Ambulatory Visit: Payer: 59 | Admitting: Physical Therapy

## 2022-11-18 DIAGNOSIS — M5459 Other low back pain: Secondary | ICD-10-CM

## 2022-11-18 DIAGNOSIS — M6281 Muscle weakness (generalized): Secondary | ICD-10-CM

## 2022-11-18 DIAGNOSIS — R252 Cramp and spasm: Secondary | ICD-10-CM

## 2022-11-18 NOTE — Therapy (Signed)
OUTPATIENT PHYSICAL THERAPY TREATMENT NOTE/DISCHARGE SUMMARY   Patient Name: Gloria [name] MRN: [id number] DOB:July 26, 1971, 51 y.o., female Today's Date: 11/18/2022   PT End of Session - 11/18/22 1442     Visit Number 7    Date for PT Re-Evaluation 11/19/22    Authorization Type [employer]    PT Start Time 6160    PT Stop Time 1515   PT Time Calculation (min) 30 min Discharge visit   Activity Tolerance Patient tolerated treatment well    Behavior During Therapy [facility] for tasks assessed/performed              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   DDD (degenerative disc disease), lumbar    Depression    Diabetes ([place])    Diverticulosis    Drug use    Elevated LFTs    Fibromyalgia    GERD (gastroesophageal reflux disease)    History of ETOH abuse    Hyperlipidemia    Hyperplastic colon polyp    Internal hemorrhoids    Migraines    NAFLD (nonalcoholic fatty liver disease)    Obese    Obstructive sleep apnea 02/25/2015   not diagnosed per pt 04-03-20   Osteoarthritis    lower back- DDD   Periodic limb movement disorder 02/25/2014   Pre-diabetes    pt taking metformin   RLS (restless legs syndrome)    Sleep apnea    Smoker    Snoring    Substance abuse ([place])    Swallowing difficulty    Vitamin B12 deficiency    Vitamin D deficiency    Past Surgical History:  Procedure Laterality Date   CERVICAL FUSION     COLONOSCOPY     CYSTOSCOPY  06/06/2013   Procedure: CYSTOSCOPY;  Surgeon: Terrance Mass, MD;  Location: [facility];  Service: Gynecology;;   EXCISION METACARPAL MASS Right 09/03/2021   Procedure: EXCISION METACARPAL MASS RIGHT SMALL FINGER;   Surgeon: Daryll Brod, MD;  Location: [facility];  Service: Orthopedics;  Laterality: Right;   INTRAUTERINE DEVICE INSERTION  08/09/2008   PARAGUARD- removed   kidney surgery  1995   abcess   LAPAROSCOPY N/A 06/06/2013   Procedure: LAPAROSCOPY DIAGNOSTIC;  Surgeon: Terrance Mass, MD;  Location: [facility];  Service: Gynecology;  Laterality: N/A;   LAPAROTOMY  06/06/2013   Procedure: EXPLORATORY LAPAROTOMY;  Surgeon: Terrance Mass, MD;  Location: [facility];  Service: Gynecology;;   LIVER BIOPSY     radial tunnel Bilateral 2005   VAGINAL HYSTERECTOMY Left 06/06/2013   Procedure: Vaginal Hysterectomy with Patiial Right Salpingectomy;  Surgeon: Terrance Mass, MD;  Location: [facility];  Service: Gynecology;  Laterality: Left;   Patient Active Problem List   Diagnosis Date Noted   Low back pain 10/07/2022   Pre-hypertension 08/05/2022   Mixed hyperlipidemia 07/14/2022   Stress 06/09/2022   Essential hypertension 05/03/2022   At risk for heart disease 05/03/2022   Vitamin D deficiency 04/07/2022   At risk for hypoglycemia 04/07/2022  Non-restorative sleep 10/25/2020   Gasping for breath 09/23/2020   Class 3 severe obesity with serious comorbidity and body mass index (BMI) of 45.0 to 49.9 in adult [facility]) 09/23/2020   Hypersomnia, recurrent 09/23/2020   MDD (recurrent major depressive disorder) in remission ([place]) 09/23/2020   Diabetes mellitus ([place]) 04/06/2019   Chronic back pain 02/25/2015   Periodic limb movement disorder 02/25/2014   Restless legs syndrome 12/15/2013   Cardiac murmur 10/16/2013   Snoring 10/16/2013   Hx of migraine headaches 10/16/2013   Fluid retention 10/12/2013   Weight gain 10/12/2013   Anxiety 99/35/7017   Alcoholic liver disease ([place]) 10/28/2012   Ex-cigarette smoker 10/28/2012   Fibromyalgia 10/27/2012   Depression with anxiety 10/27/2012   GERD (gastroesophageal reflux disease) 10/27/2012   H/O ETOH abuse 10/27/2012    PCP: Billie Ruddy,  MD  REFERRING PROVIDER: Starlyn Skeans, MD  REFERRING DIAG: M54.50,G89.29 (ICD-10-CM) - Chronic bilateral low back pain without sciatica  Rationale for Evaluation and Treatment: Rehabilitation  THERAPY DIAG:  Other low back pain  Muscle weakness (generalized)  Cramp and spasm  ONSET DATE: Approx 14 years ago  SUBJECTIVE:                                                                                                                                                                                           SUBJECTIVE STATEMENT: I already joined [employer].  I really enjoyed the pool.  I feel confident in using the laminated ex's and following it.  My son noticed I was moving faster.  Rates overall progress at 25%.  Constant pain 3-5/10.  More aggravated with sitting too long.     PERTINENT HISTORY:  DM, HTN, Obesity  PAIN:  Are you having pain? No  PRECAUTIONS: None  WEIGHT BEARING RESTRICTIONS: No  FALLS:  Has patient fallen in last 6 months? 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)  PLOF: Independent, Vocation/Vocational requirements: Prolonged computer work, and Leisure: play cards with friends, AA meetings/related activities  PATIENT GOALS: To be able to begin an exercise routine and get stronger  NEXT MD VISIT: Dr Leafy Ro November 16  OBJECTIVE:   DIAGNOSTIC FINDINGS:  Has has imaging in the past with Dr Nelva Bush that revealed DDD  PATIENT SURVEYS:  FOTO 50% (projected 58% by visit 12)  12/28: 55%  SCREENING FOR RED FLAGS: Bowel or bladder incontinence: No Spinal tumors: No Cauda equina syndrome: No Compression fracture:  No Abdominal aneurysm: No  COGNITION: Overall cognitive status: Within functional limits for tasks  assessed     SENSATION: WFL  MUSCLE LENGTH: Hamstrings: Tightness bilaterally  POSTURE: rounded shoulders, increased lumbar lordosis, and anterior pelvic tilt  PALPATION: No tenderness to palpation, but does have some muscle spasms along lumbar multifidi  LUMBAR ROM:   Limited 25-50% with some pain if pushes past comfortable range 12/28: < 20% movement loss in flexion and extension  LOWER EXTREMITY ROM:     WFL  LOWER EXTREMITY MMT:    09/28/2022:  Hip strength of 4/5, otherwise [facility] 12/28: grossly 4+/5  LUMBAR SPECIAL TESTS:  Slump test: Negative  FUNCTIONAL TESTS:  5 times sit to stand: 12.3 sec with minimal use of UE 12/28 no hands 7.7 sec  GAIT: Distance walked: >1000 ft Assistive device utilized: None Level of assistance: Complete Independence Comments: Pt reports that she walk at least 20 minutes before she is limited by pain  TODAY'S TREATMENT:   11/18/22: Nu-Step 6 min while discussing status/progress FOTO Lumbar ROM assessment 5x sit to stand test LE MMT Discussion of expected improvement over time Goal check      11/16/22: Pt seen for aquatic therapy today.  Treatment took place in water 3.25-4.75 ft in depth at the [facility]. Temp of water was 91.  Pt entered/exited the pool via stairs independently with bilat rail.  * walking forward/ backward without support * side stepping with shoulder add/abdct with rainbow hand floats * piriformis stretch; L stretch with tail wagging *Hip hinge * forward/backward walking 1 lap with single rainbow/yellow hand float at side under water * staggered stance then wide stance with kick board push pull x 15 each. Cues for increased speed  * suspended on yellow noodle between legs:  cycling with breast stroke arms, add/abd, cc ski * TrA set with short blue noodle pull down with isometric holds at various positions (elevator stops)   Pt requires the buoyancy and hydrostatic pressure of water for  support, and to offload joints by unweighting joint load by at least 50 % in navel deep water and by at least 75-80% in chest to neck deep water.  Viscosity of the water is needed for resistance of strengthening. Water current perturbations provides challenge to standing balance requiring increased core activation.   10/22/22: Pt arrives for aquatic physical therapy. Treatment took place in 3.5-5.5 feet of water. Water temperature was 91 degrees F. Pt entered the pool via stairs, reciprocally with mild use of rails. Pt requires buoyancy of water for support and to offload joints with strengthening exercises.  Seated water bench with 75% submersion Pt performed seated LE AROM exercises 20x in all planes, concurrent discussion of water principles and how we would be using them. Pt verbally understood all. 75% depth water walking with yellow noodle for postural support 6 lengths in each direction. VC for core. Standing against wall for SKC stretch 3x 10 sec Bil, post pelvic tilt 10x 5 sec hold, hip 3 ways 15x Bil with UE balance support. High knee marching with noodle 4 lengths followed by horizontal decompression without any floatation x10 min.  DATE: 10/05/2022 Nustep level 3 x6 min with PT present to discuss status Seated marching and LAQ x10 reps bilat Manual Therapy:  pt in right sidelying using [employer] to left paraspinals, glutes/piriformis, TFL/IT band, and hamstrings Supine butterfly stretch 2x20 sec Lower trunk rotation x10 Supine posterior pelvic tilt 2x10 Supine hamstring stretch with strap 2x20 sec bilat Supine clamshells with blue loop 2x10 Shoulder press in decompression pose 2x10 Educated pt in using a tennis ball to assist with self trigger point release to piriformis, she was able to return demonstration Seated piriformis stretch 2x20 sec bilat   DATE:  09/28/2022 Reviewed HEP (see below)    PATIENT EDUCATION:  Education details: Pt issued HEP Person educated: Patient Education method: Explanation, Demonstration, and Handouts Education comprehension: verbalized understanding  HOME EXERCISE PROGRAM: Access Code: [email or web address] URL: [email or web address] Date: 10/05/2022 Prepared by: Shelby Dubin Menke  Exercises - Supine Lower Trunk Rotation  - 1 x daily - 7 x weekly - 2 sets - 5 reps - 5 sec hold - Supine Butterfly Groin Stretch  - 1 x daily - 7 x weekly - 1 sets - 5 reps - 15 - 20 sec hold - Supine Posterior Pelvic Tilt  - 1 x daily - 7 x weekly - 2 sets - 10 reps - Seated Hamstring Stretch  - 1 x daily - 7 x weekly - 2 sets - 2 reps - 20 sec hold - Seated March  - 1 x daily - 7 x weekly - 2 sets - 10 reps - Seated Long Arc Quad  - 1 x daily - 7 x weekly - 2 sets - 10 reps - Seated Piriformis Stretch  - 1 x daily - 7 x weekly - 2 sets - 2 reps - 20 sec hold  ASSESSMENT:  CLINICAL IMPRESSION: The patient has met the majority of rehab goals, with noted improvements in functional outcome score, ROM, strength and functional mobility including speed of movement (see sit to stand test above).  She has responded very well to an aquatic based exercise and a comprehensive pool program has been established.  The patient expresses she feels  confident in carrying out this program and has already joined [employer] for the year.   We anticipate further improvements over time with regular performance of the program.  Recommend discharge from PT at this time.     OBJECTIVE IMPAIRMENTS: decreased strength, increased muscle spasms, impaired flexibility, and pain.   ACTIVITY LIMITATIONS: carrying, lifting, bending, standing, and transfers  PARTICIPATION LIMITATIONS: community activity  PERSONAL FACTORS: Fitness, Time since onset of injury/illness/exacerbation, and 1-2 comorbidities: DM, obesity  are also affecting patient's functional outcome.    REHAB POTENTIAL: Good  CLINICAL DECISION MAKING: Stable/uncomplicated  EVALUATION COMPLEXITY: Low   GOALS: Goals reviewed with patient? No  SHORT TERM GOALS: Target date: 10/26/2022  Pt will be independent with initial HEP. Baseline: Goal status: MET  2.  Pt will report at least a 30% improvement in functional impairments. Baseline:  Goal status: PARTIALLY MET   LONG TERM GOALS: Target date: 11/19/2022  Pt will be independent with advanced HEP. Baseline:  Goal status: met (pt indep with final aquatic HEP 12/26)  2.  Pt will increase bilateral hip strength to at least 4+/5 to allow her to perform transfers with increased ease. Baseline:  Goal status: goal met 12/28 3.  Pt will improve FOTO to at least 58% to demonstrate improvements in functional mobility. Baseline:  Goal status: partially met  4.  Pt will report pain  no greater than 2/10 during most functional tasks throughout the day. Baseline:  Goal status: not met  5.  Pt will have a plan to transition to [place] vs [employer] or other gym for continued fitness following discharge from PT. Baseline:  Goal status: goal met 12/28 PLAN:  PHYSICAL THERAPY DISCHARGE SUMMARY  Visits from Start of Care: 7  Current functional level related to goals / functional outcomes: See clinical impressions above   Remaining deficits: As above   Education / Equipment: Pool program   Patient agrees to discharge. Patient goals were partially met. Patient is being discharged due to meeting the stated rehab goals. Ruben Im, PT 11/18/22 3:22 PM Phone: [phone] Fax: [phone]

## 2022-11-25 ENCOUNTER — Encounter: Payer: Self-pay | Admitting: Family Medicine

## 2022-11-25 ENCOUNTER — Telehealth (INDEPENDENT_AMBULATORY_CARE_PROVIDER_SITE_OTHER): Payer: 59 | Admitting: Family Medicine

## 2022-11-25 DIAGNOSIS — U071 COVID-19: Secondary | ICD-10-CM | POA: Diagnosis not present

## 2022-11-25 DIAGNOSIS — R051 Acute cough: Secondary | ICD-10-CM

## 2022-11-25 MED ORDER — MOLNUPIRAVIR EUA 200MG CAPSULE: 4.0000 | ORAL_CAPSULE | Freq: Two times a day (BID) | ORAL | 0 refills | Status: AC

## 2022-11-25 MED ORDER — BENZONATATE 200 MG PO CAPS: 200.0000 mg | ORAL_CAPSULE | Freq: Two times a day (BID) | ORAL | 0 refills | Status: DC | PRN

## 2022-11-25 NOTE — Progress Notes (Signed)
Virtual Visit via Video Note  I connected with Gloria [name] on 11/25/22 at  2:30 PM EST by a video enabled telemedicine application 2/2 [id number] pandemic and verified that I am speaking with the correct person using two identifiers.  Location patient: home Location provider:work or home office Persons participating in the virtual visit: patient, provider  I discussed the limitations of evaluation and management by telemedicine and the availability of in person appointments. The patient expressed understanding and agreed to proceed.  Chief Complaint  Patient presents with   Covid Positive    Pt reports sx of Cough, nasal congestion, runny nose, headache, chills, fatigue, shortness of breath. Sx started yesterday afternoon. Taking ibuprofen Tested positive covid today with home test kit.       HPI: Pt is a 52 yo female with pmh sig for HTN, GERD, DM, alcoholic liver disease, anxiety, obesity, history of depression, fibromyalgia, HLD, former tobacco use who is seen for acute concern.  Yesterday afternoon at work pt had tickle in throat, HA, and cough.  Symptoms increased overnight.  Developed sore throat, facial pressure, nasal congestion, subjective fever, chills.  Was unable to sleep last night 2/2 coughing.  Positive at home COVID test this am.  Endorses decreased appetite.  Staying hydrated.  Denies n/v, diarrhea.  Tried Mucinex.  Sick contacts include coworkers.   ROS: See pertinent positives and negatives per HPI.  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   DDD (degenerative disc disease), lumbar    Depression    Diabetes ([place])    Diverticulosis    Drug use     Elevated LFTs    Fibromyalgia    GERD (gastroesophageal reflux disease)    History of ETOH abuse    Hyperlipidemia    Hyperplastic colon polyp    Internal hemorrhoids    Migraines    NAFLD (nonalcoholic fatty liver disease)    Obese    Obstructive sleep apnea 02/25/2015   not diagnosed per pt 04-03-20   Osteoarthritis    lower back- DDD   Periodic limb movement disorder 02/25/2014   Pre-diabetes    pt taking metformin   RLS (restless legs syndrome)    Sleep apnea    Smoker    Snoring    Substance abuse ([place])    Swallowing difficulty    Vitamin B12 deficiency    Vitamin D deficiency     Past Surgical History:  Procedure Laterality Date   CERVICAL FUSION     COLONOSCOPY     CYSTOSCOPY  06/06/2013   Procedure: CYSTOSCOPY;  Surgeon: Terrance Mass, MD;  Location: [facility];  Service: Gynecology;;   EXCISION METACARPAL MASS Right 09/03/2021   Procedure: EXCISION METACARPAL MASS RIGHT SMALL FINGER;  Surgeon: Daryll Brod, MD;  Location: [facility];  Service: Orthopedics;  Laterality: Right;   INTRAUTERINE DEVICE INSERTION  08/09/2008   PARAGUARD- removed   kidney surgery  1995   abcess   LAPAROSCOPY N/A 06/06/2013   Procedure: LAPAROSCOPY DIAGNOSTIC;  Surgeon: Terrance Mass, MD;  Location: [facility];  Service: Gynecology;  Laterality: N/A;   LAPAROTOMY  06/06/2013   Procedure: EXPLORATORY LAPAROTOMY;  Surgeon: Terrance Mass, MD;  Location: [facility];  Service: Gynecology;;   LIVER BIOPSY     radial tunnel Bilateral 2005   VAGINAL HYSTERECTOMY Left 06/06/2013   Procedure: Vaginal Hysterectomy with Patiial Right Salpingectomy;  Surgeon: Terrance Mass, MD;  Location: [facility];  Service: Gynecology;  Laterality: Left;    Family History  Problem Relation Age of Onset   Cancer Mother    Breast cancer Mother    Depression Mother    Anxiety disorder Mother    Obesity Mother    Sleep apnea Mother    Obesity Father    Anxiety disorder Father    Cancer Father     Hyperlipidemia Father    Colon polyps Father    Diabetes Father    Cirrhosis Father    Liver cancer Father    Liver disease Father    Alcoholism Father    Depression Father    Alcohol abuse Father    Sleep apnea Father    Irritable bowel syndrome Sister    Sleep apnea Brother    Breast cancer Maternal Aunt    Alcoholism Maternal Aunt    Alcoholism Maternal Grandmother    Alcoholism Maternal Grandfather    Breast cancer Paternal Grandmother    Colon cancer Paternal Grandmother    Esophageal cancer Paternal Grandfather    Stomach cancer Paternal Grandfather    Alcoholism Paternal Grandfather    Rectal cancer Neg Hx     Current Outpatient Medications:    azelastine (ASTELIN) 0.1 % nasal spray, Place 2 sprays into both nostrils 2 (two) times daily., Disp: 30 mL, Rfl: 12   B Complex Vitamins (B COMPLEX PO), Take 1 tablet by mouth daily., Disp: , Rfl:    blood glucose meter kit and supplies KIT, Dispense based on patient and insurance preference. Use up to four times daily as directed. (FOR ICD-9 250.00, 250.01)., Disp: 1 each, Rfl: 0   Blood Glucose Monitoring Suppl (ONETOUCH VERIO FLEX SYSTEM) w/Device KIT, Use to check Blood sugars twice daily before meals, Disp: 1 kit, Rfl: 0   Brexpiprazole (REXULTI) 4 MG TABS, Take by mouth., Disp: , Rfl:    Calcium Carbonate-Vitamin D 600-400 MG-UNIT tablet, Take 1 tablet by mouth daily., Disp: , Rfl:    clotrimazole-betamethasone (LOTRISONE) cream, Apply 1 application topically 2 (two) times daily. For 10 days maximum, Disp: 45 g, Rfl: 1   Coenzyme Q10 (COQ10) 100 MG CAPS, Take 100 mg by mouth daily., Disp: , Rfl:    estradiol (VIVELLE-DOT) 0.05 MG/24HR patch, Place 1 patch (0.05 mg total) onto the skin 2 (two) times a week., Disp: 24 patch, Rfl: 4   fluticasone (CUTIVATE) 0.05 % cream, Apply topically 2 (two) times daily., Disp: , Rfl:    glucose blood (CONTOUR NEXT TEST) test strip, Use to test blood glucose twice daily, Disp: 100 each, Rfl:  5   ibuprofen (ADVIL) 800 MG tablet, TAKE 1 TABLET(800 MG) BY MOUTH EVERY 8 HOURS AS NEEDED FOR MODERATE PAIN, Disp: 30 tablet, Rfl: 3   ketoconazole (NIZORAL) 2 % cream, Apply 1 application topically daily., Disp: , Rfl:    Lancets (ONETOUCH DELICA PLUS [id number]) MISC, USE 1  TO CHECK GLUCOSE 4 TIMES DAILY, Disp: 100 each, Rfl: 0   loratadine (CLARITIN) 10 MG tablet, Take 10 mg by mouth daily., Disp: , Rfl:    LORazepam (ATIVAN) 0.5  MG tablet, Take 0.5 mg by mouth daily., Disp: , Rfl:    Magnesium 500 MG CAPS, Take by mouth., Disp: , Rfl:    metroNIDAZOLE (METROCREAM) 0.75 % cream, daily, Disp: , Rfl:    Multiple Vitamins-Minerals (MULTIVITAMIN PO), Take 1 tablet by mouth daily. , Disp: , Rfl:    OMEGA 3-6-9 FATTY ACIDS PO, Take 500 mg by mouth daily., Disp: , Rfl:    oxcarbazepine (TRILEPTAL) 600 MG tablet, Take 600 mg by mouth 2 (two) times daily., Disp: , Rfl:    pantoprazole (PROTONIX) 40 MG tablet, TAKE 1 TABLET BY MOUTH DAILY (Patient taking differently: as needed.), Disp: 90 tablet, Rfl: 0   rizatriptan (MAXALT) 10 MG tablet, Take 1 tablet (10 mg total) by mouth as needed for migraine., Disp: 10 tablet, Rfl: 5   thiamine (VITAMIN B-1) 50 MG tablet, Take 50 mg by mouth daily., Disp: , Rfl:    tirzepatide (MOUNJARO) 15 MG/0.5ML Pen, Inject 15 mg into the skin once a week., Disp: 6 mL, Rfl: 0   vitamin C (ASCORBIC ACID) 500 MG tablet, Take 500 mg by mouth daily., Disp: , Rfl:    VITAMIN E PO, Take 900 mg by mouth daily., Disp: , Rfl:    diclofenac (VOLTAREN) 75 MG EC tablet, Take 75 mg by mouth 2 (two) times daily. (Patient not taking: Reported on 11/25/2022), Disp: , Rfl:   EXAM:  VITALS per patient if applicable: RR between 58-68 bpm  GENERAL: alert, oriented, appears well and in no acute distress  HEENT: atraumatic, conjunctiva clear, no obvious abnormalities on inspection of external nose and ears  NECK: normal movements of the head and neck  LUNGS: on inspection no signs of  respiratory distress, breathing rate appears normal, no obvious gross SOB, gasping or wheezing  CV: no obvious cyanosis  MS: moves all visible extremities without noticeable abnormality  PSYCH/NEURO: pleasant and cooperative, no obvious depression or anxiety, speech and thought processing grossly intact  ASSESSMENT AND PLAN:  Discussed the following assessment and plan:  COVID-19 virus infection - Plan: molnupiravir EUA (LAGEVRIO) 200 mg CAPS capsule, benzonatate (TESSALON) 200 MG capsule  Acute cough - Plan: benzonatate (TESSALON) 200 MG capsule  Positive at-home COVID test this morning with symptoms starting yesterday 11/24/2022.  Discussed r/b/a  of antiviral medication.  Patient wishes to start molnupiravir.  Rx sent to pharmacy.  Tessalon for cough.  Continue supportive care/expectant management.  Discussed likely duration of symptoms and length of quarantine.  Given strict precautions  Follow-up as needed for continued or worsening symptoms.   I discussed the assessment and treatment plan with the patient. The patient was provided an opportunity to ask questions and all were answered. The patient agreed with the plan and demonstrated an understanding of the instructions.   The patient was advised to call back or seek an in-person evaluation if the symptoms worsen or if the condition fails to improve as anticipated.  Billie Ruddy, MD

## 2022-11-30 NOTE — Progress Notes (Signed)
Chief Complaint:   OBESITY Gloria [name] is here to discuss her progress with her obesity treatment plan along with follow-up of her obesity related diagnoses. Gloria [name] is on the Category 2 Plan and states she is following her eating plan approximately 75% of the time. Gloria [name] states she is doing water aerobics for 30 minutes 1 time per week.  Today's visit was #: 17 Starting weight: 274 lbs Starting date: 08/07/2021 Today's weight: 244 lbs Today's date: 11/09/2022 Total lbs lost to date: 30 Total lbs lost since last in-office visit: 3  Interim History: Gloria [name] continues to do very well with weight loss even over the holidays.  She is doing well with being mindful of her food options.  Subjective:   1. Type 2 diabetes mellitus with other specified complication, unspecified whether long term insulin use (HCC) Gloria [name] continues to do well with her diet and weight loss.  She is doing well on Mounjaro with decreased polyphagia.  She denies hypoglycemia.  2. Pre-hypertension Gloria [name]'s blood pressure is borderline elevated, and this may be due to some increased sodium and social eating.  She continues to do well with her weight loss.  Assessment/Plan:   1. Type 2 diabetes mellitus with other specified complication, unspecified whether long term insulin use (HCC) Gloria [name] will continue Mounjaro 15 mg once weekly, and we will refill for 1 month.  - tirzepatide (MOUNJARO) 15 MG/0.5ML Pen; Inject 15 mg into the skin once a week.  Dispense: 6 mL; Refill: 0  2. Pre-hypertension Gloria [name] will continue with her diet and exercise, and we will recheck her blood pressure in 1 month.  3. Obesity, Current BMI 40.6 Gloria [name] is currently in the action stage of change. As such, her goal is to continue with weight loss efforts. She has agreed to the Category 2 Plan.   Exercise goals: As is.   Behavioral modification strategies: holiday eating strategies  and celebration eating strategies.  Gloria [name] has  agreed to follow-up with our clinic in 4 weeks. She was informed of the importance of frequent follow-up visits to maximize her success with intensive lifestyle modifications for her multiple health conditions.   Objective:   Pulse 72, temperature 98.1 F (36.7 C), height '5\' 5"'$  (1.651 m), weight 244 lb (110.7 kg), last menstrual period 04/25/2013, SpO2 98 %. Body mass index is 40.6 kg/m.  General: Cooperative, alert, well developed, in no acute distress. HEENT: Conjunctivae and lids unremarkable. Cardiovascular: Regular rhythm.  Lungs: Normal work of breathing. Neurologic: No focal deficits.   Lab Results  Component Value Date   CREATININE 0.58 07/14/2022   BUN 7 07/14/2022   NA 133 (L) 07/14/2022   K 4.3 07/14/2022   CL 97 07/14/2022   CO2 23 07/14/2022   Lab Results  Component Value Date   ALT 22 07/14/2022   AST 19 07/14/2022   ALKPHOS 79 07/14/2022   BILITOT <0.2 07/14/2022   Lab Results  Component Value Date   HGBA1C 5.6 07/14/2022   HGBA1C 5.6 12/28/2021   HGBA1C 6.2 (H) 08/07/2021   HGBA1C 6.3 (A) 05/20/2021   HGBA1C 5.7 (H) 09/19/2020   Lab Results  Component Value Date   INSULIN 41.2 (H) 07/14/2022   INSULIN 30.5 (H) 12/28/2021   INSULIN 29.5 (H) 08/07/2021   Lab Results  Component Value Date   TSH 2.860 08/07/2021   Lab Results  Component Value Date   CHOL 216 (H) 07/14/2022   HDL 44 07/14/2022   LDLCALC 139 (H) 07/14/2022   LDLDIRECT  145.0 07/18/2019   TRIG 185 (H) 07/14/2022   CHOLHDL 6.1 (H) 09/19/2020   Lab Results  Component Value Date   VD25OH 64.0 07/14/2022   VD25OH 60.0 12/28/2021   VD25OH 45.2 08/07/2021   Lab Results  Component Value Date   WBC 10.4 08/07/2021   HGB 13.2 08/07/2021   HCT 40.2 08/07/2021   MCV 89 08/07/2021   PLT 284 08/07/2021   Lab Results  Component Value Date   IRON 54 10/06/2017   TIBC 304 10/06/2017   FERRITIN 151 (H) 10/06/2017   Attestation Statements:   Reviewed by clinician on day of visit:  allergies, medications, problem list, medical history, surgical history, family history, social history, and previous encounter notes.   I, Trixie Dredge, am acting as transcriptionist for Dennard Nip, MD.  I have reviewed the above documentation for accuracy and completeness, and I agree with the above. -  Dennard Nip, MD

## 2022-12-07 ENCOUNTER — Encounter (INDEPENDENT_AMBULATORY_CARE_PROVIDER_SITE_OTHER): Payer: Self-pay | Admitting: Family Medicine

## 2022-12-07 ENCOUNTER — Ambulatory Visit (INDEPENDENT_AMBULATORY_CARE_PROVIDER_SITE_OTHER): Payer: 59 | Admitting: Family Medicine

## 2022-12-07 VITALS — BP 152/72 | HR 71 | Temp 97.5°F | Ht 65.0 in | Wt 242.0 lb

## 2022-12-07 DIAGNOSIS — E669 Obesity, unspecified: Secondary | ICD-10-CM

## 2022-12-07 DIAGNOSIS — Z7985 Long-term (current) use of injectable non-insulin antidiabetic drugs: Secondary | ICD-10-CM

## 2022-12-07 DIAGNOSIS — Z6841 Body Mass Index (BMI) 40.0 and over, adult: Secondary | ICD-10-CM | POA: Diagnosis not present

## 2022-12-07 DIAGNOSIS — I10 Essential (primary) hypertension: Secondary | ICD-10-CM

## 2022-12-07 DIAGNOSIS — E1169 Type 2 diabetes mellitus with other specified complication: Secondary | ICD-10-CM | POA: Diagnosis not present

## 2022-12-07 MED ORDER — TIRZEPATIDE 15 MG/0.5ML ~~LOC~~ SOAJ
15.0000 mg | SUBCUTANEOUS | 0 refills | Status: DC
  Filled 2023-01-03: qty 2, 28d supply, fill #0

## 2022-12-10 DIAGNOSIS — J343 Hypertrophy of nasal turbinates: Secondary | ICD-10-CM | POA: Insufficient documentation

## 2022-12-10 DIAGNOSIS — J3489 Other specified disorders of nose and nasal sinuses: Secondary | ICD-10-CM | POA: Insufficient documentation

## 2022-12-14 NOTE — Progress Notes (Unsigned)
Chief Complaint:   OBESITY Gloria [name] is here to discuss her progress with her obesity treatment plan along with follow-up of her obesity related diagnoses. Gloria [name] is on the Category 2 Plan and states she is following her eating plan approximately 90% of the time. Gloria [name] states she is doing water aerobics for 45 minutes 2 times per week.  Today's visit was #: 29 Starting weight: 274 lbs Starting date: 08/07/2021 Today's weight: 242 lbs Today's date: 12/07/2022 Total lbs lost to date: 32 Total lbs lost since last in-office visit: 2  Interim History: Gloria [name] did very well with avoiding holiday weight gain and has actually lost 2 pounds instead.  She is enjoying her water exercises.  Her hunger is mostly controlled and she is working on meal planning.  Subjective:   1. Essential hypertension Gloria [name]'s blood pressure is elevated today.  She has decreased sodium in her diet.  Her blood pressure at home tends to run in the 130s.  She thinks it may be stress related.  2. Type 2 diabetes mellitus with other specified complication, unspecified whether long term insulin use (HCC) Gloria [name] is doing well with her diet and Mounjaro.  Her polyphagia has improved, and she denies nausea or vomiting.  Assessment/Plan:   1. Essential hypertension Gloria [name] is to continue to check her blood pressure at home at different times of the day, and we will continue to follow.  2. Type 2 diabetes mellitus with other specified complication, unspecified whether long term insulin use ([place]) Gloria [name] will continue Mounjaro, and we will refill for 1 month.  - tirzepatide (MOUNJARO) 15 MG/0.5ML Pen; Inject 15 mg into the skin once a week.  Dispense: 2 mL; Refill: 0  3. Obesity, Current BMI 40.4 Gloria [name] is currently in the action stage of change. As such, her goal is to continue with weight loss efforts. She has agreed to the Category 2 Plan.   Easy recipes were discussed to help her with meal planning.    Exercise goals: As is.   Behavioral modification strategies: increasing lean protein intake and meal planning and cooking strategies.  Gloria [name] has agreed to follow-up with our clinic in 4 weeks. She was informed of the importance of frequent follow-up visits to maximize her success with intensive lifestyle modifications for her multiple health conditions.   Objective:   Blood pressure (!) 152/72, pulse 71, temperature (!) 97.5 F (36.4 C), height '5\' 5"'$  (1.651 m), weight 242 lb (109.8 kg), last menstrual period 04/25/2013, SpO2 98 %. Body mass index is 40.27 kg/m.  General: Cooperative, alert, well developed, in no acute distress. HEENT: Conjunctivae and lids unremarkable. Cardiovascular: Regular rhythm.  Lungs: Normal work of breathing. Neurologic: No focal deficits.   Lab Results  Component Value Date   CREATININE 0.58 07/14/2022   BUN 7 07/14/2022   NA 133 (L) 07/14/2022   K 4.3 07/14/2022   CL 97 07/14/2022   CO2 23 07/14/2022   Lab Results  Component Value Date   ALT 22 07/14/2022   AST 19 07/14/2022   ALKPHOS 79 07/14/2022   BILITOT <0.2 07/14/2022   Lab Results  Component Value Date   HGBA1C 5.6 07/14/2022   HGBA1C 5.6 12/28/2021   HGBA1C 6.2 (H) 08/07/2021   HGBA1C 6.3 (A) 05/20/2021   HGBA1C 5.7 (H) 09/19/2020   Lab Results  Component Value Date   INSULIN 41.2 (H) 07/14/2022   INSULIN 30.5 (H) 12/28/2021   INSULIN 29.5 (H) 08/07/2021   Lab Results  Component Value  Date   TSH 2.860 08/07/2021   Lab Results  Component Value Date   CHOL 216 (H) 07/14/2022   HDL 44 07/14/2022   LDLCALC 139 (H) 07/14/2022   LDLDIRECT 145.0 07/18/2019   TRIG 185 (H) 07/14/2022   CHOLHDL 6.1 (H) 09/19/2020   Lab Results  Component Value Date   VD25OH 64.0 07/14/2022   VD25OH 60.0 12/28/2021   VD25OH 45.2 08/07/2021   Lab Results  Component Value Date   WBC 10.4 08/07/2021   HGB 13.2 08/07/2021   HCT 40.2 08/07/2021   MCV 89 08/07/2021   PLT 284 08/07/2021    Lab Results  Component Value Date   IRON 54 10/06/2017   TIBC 304 10/06/2017   FERRITIN 151 (H) 10/06/2017   Attestation Statements:   Reviewed by clinician on day of visit: allergies, medications, problem list, medical history, surgical history, family history, social history, and previous encounter notes.   I, Trixie Dredge, am acting as transcriptionist for Dennard Nip, MD.  I have reviewed the above documentation for accuracy and completeness, and I agree with the above. -  Dennard Nip, MD

## 2023-01-03 ENCOUNTER — Other Ambulatory Visit (HOSPITAL_COMMUNITY): Payer: Self-pay

## 2023-01-04 ENCOUNTER — Encounter (INDEPENDENT_AMBULATORY_CARE_PROVIDER_SITE_OTHER): Payer: Self-pay | Admitting: Family Medicine

## 2023-01-04 ENCOUNTER — Ambulatory Visit (INDEPENDENT_AMBULATORY_CARE_PROVIDER_SITE_OTHER): Payer: 59 | Admitting: Family Medicine

## 2023-01-04 VITALS — BP 146/75 | HR 66 | Temp 97.9°F | Ht 65.0 in | Wt 240.0 lb

## 2023-01-04 DIAGNOSIS — M47899 Other spondylosis, site unspecified: Secondary | ICD-10-CM | POA: Diagnosis not present

## 2023-01-04 DIAGNOSIS — Z6841 Body Mass Index (BMI) 40.0 and over, adult: Secondary | ICD-10-CM

## 2023-01-04 DIAGNOSIS — E669 Obesity, unspecified: Secondary | ICD-10-CM

## 2023-01-04 DIAGNOSIS — Z7985 Long-term (current) use of injectable non-insulin antidiabetic drugs: Secondary | ICD-10-CM

## 2023-01-04 DIAGNOSIS — E1169 Type 2 diabetes mellitus with other specified complication: Secondary | ICD-10-CM

## 2023-01-04 MED ORDER — TIRZEPATIDE 15 MG/0.5ML ~~LOC~~ SOAJ
15.0000 mg | SUBCUTANEOUS | 0 refills | Status: DC
  Filled 2023-01-28: qty 2, 28d supply, fill #0

## 2023-01-06 ENCOUNTER — Other Ambulatory Visit (HOSPITAL_COMMUNITY): Payer: Self-pay

## 2023-01-18 NOTE — Progress Notes (Unsigned)
Chief Complaint:   OBESITY Gloria [name] is here to discuss her progress with her obesity treatment plan along with follow-up of her obesity related diagnoses. Gloria [name] is on the Category 2 Plan and states she is following her eating plan approximately 60% of the time. Gloria [name] states she is doing 0 minutes 0 times per week.  Today's visit was #: 28 Starting weight: 274 lbs Starting date: 08/07/2021 Today's weight: 240 lbs Today's date: 01/04/2023 Total lbs lost to date: 34 Total lbs lost since last in-office visit: 2  Interim History: Gloria [name] continues to do well with her weight loss despite being on prednisone for back pain. She did have some increased cravings but she tried to make smarter choices. She is limited with exercise.   Subjective:   1. Type 2 diabetes mellitus with other specified complication, unspecified whether long term insulin use ([place]) Gloria [name] is stable on Mounjaro, and she is working on her diet and weight loss with no side effects noted.   2. Other osteoarthritis of spine, unspecified spinal region Gloria [name] has had increased back pain recently. She just finished prednisone and she states it feels better, but still quite painful. It is limiting her exercise and her blood pressure is above goal.   Assessment/Plan:   1. Type 2 diabetes mellitus with other specified complication, unspecified whether long term insulin use (HCC) [name] will continue Mounjaro 15 mg once weekly, and we will refill for 1 month.   - tirzepatide (MOUNJARO) 15 MG/0.5ML Pen; Inject 15 mg into the skin once a week.  Dispense: 2 mL; Refill: 0  2. Other osteoarthritis of spine, unspecified spinal region We discussed a variety of options she might have so she can avoid opioids. She will follow-up with Ortho in approximately 1 month. She will slowly increase her exercise as tolerated.   3. BMI 40.0-44.9, adult ([place])  4. Obesity, Beginning BMI 45.60 Gloria [name] is currently in the action stage of  change. As such, her goal is to continue with weight loss efforts. She has agreed to the Category 2 Plan.   Exercise goals: Exercise as tolerated, may slowly add in water walking when she is ready.   Behavioral modification strategies: increasing lean protein intake.  Gloria [name] has agreed to follow-up with our clinic in 4 weeks. She was informed of the importance of frequent follow-up visits to maximize her success with intensive lifestyle modifications for her multiple health conditions.   Objective:   Blood pressure (!) 146/75, pulse 66, temperature 97.9 F (36.6 C), height '5\' 5"'$  (1.651 m), weight 240 lb (108.9 kg), last menstrual period 04/25/2013, SpO2 95 %. Body mass index is 39.94 kg/m.  General: Cooperative, alert, well developed, in no acute distress. HEENT: Conjunctivae and lids unremarkable. Cardiovascular: Regular rhythm.  Lungs: Normal work of breathing. Neurologic: No focal deficits.   Lab Results  Component Value Date   CREATININE 0.58 07/14/2022   BUN 7 07/14/2022   NA 133 (L) 07/14/2022   K 4.3 07/14/2022   CL 97 07/14/2022   CO2 23 07/14/2022   Lab Results  Component Value Date   ALT 22 07/14/2022   AST 19 07/14/2022   ALKPHOS 79 07/14/2022   BILITOT <0.2 07/14/2022   Lab Results  Component Value Date   HGBA1C 5.6 07/14/2022   HGBA1C 5.6 12/28/2021   HGBA1C 6.2 (H) 08/07/2021   HGBA1C 6.3 (A) 05/20/2021   HGBA1C 5.7 (H) 09/19/2020   Lab Results  Component Value Date   INSULIN 41.2 (H) 07/14/2022  INSULIN 30.5 (H) 12/28/2021   INSULIN 29.5 (H) 08/07/2021   Lab Results  Component Value Date   TSH 2.860 08/07/2021   Lab Results  Component Value Date   CHOL 216 (H) 07/14/2022   HDL 44 07/14/2022   LDLCALC 139 (H) 07/14/2022   LDLDIRECT 145.0 07/18/2019   TRIG 185 (H) 07/14/2022   CHOLHDL 6.1 (H) 09/19/2020   Lab Results  Component Value Date   VD25OH 64.0 07/14/2022   VD25OH 60.0 12/28/2021   VD25OH 45.2 08/07/2021   Lab Results   Component Value Date   WBC 10.4 08/07/2021   HGB 13.2 08/07/2021   HCT 40.2 08/07/2021   MCV 89 08/07/2021   PLT 284 08/07/2021   Lab Results  Component Value Date   IRON 54 10/06/2017   TIBC 304 10/06/2017   FERRITIN 151 (H) 10/06/2017   Attestation Statements:   Reviewed by clinician on day of visit: allergies, medications, problem list, medical history, surgical history, family history, social history, and previous encounter notes.   I, Trixie Dredge, am acting as transcriptionist for Dennard Nip, MD.  I have reviewed the above documentation for accuracy and completeness, and I agree with the above. -  ***

## 2023-01-28 ENCOUNTER — Other Ambulatory Visit (HOSPITAL_COMMUNITY): Payer: Self-pay

## 2023-01-31 ENCOUNTER — Other Ambulatory Visit (HOSPITAL_COMMUNITY): Payer: Self-pay

## 2023-02-01 ENCOUNTER — Ambulatory Visit (INDEPENDENT_AMBULATORY_CARE_PROVIDER_SITE_OTHER): Payer: 59 | Admitting: Family Medicine

## 2023-02-01 ENCOUNTER — Encounter (INDEPENDENT_AMBULATORY_CARE_PROVIDER_SITE_OTHER): Payer: Self-pay | Admitting: Family Medicine

## 2023-02-01 VITALS — BP 154/65 | HR 70 | Temp 97.9°F | Ht 65.0 in | Wt 237.0 lb

## 2023-02-01 DIAGNOSIS — E669 Obesity, unspecified: Secondary | ICD-10-CM | POA: Diagnosis not present

## 2023-02-01 DIAGNOSIS — Z6839 Body mass index (BMI) 39.0-39.9, adult: Secondary | ICD-10-CM | POA: Diagnosis not present

## 2023-02-01 DIAGNOSIS — E1169 Type 2 diabetes mellitus with other specified complication: Secondary | ICD-10-CM

## 2023-02-01 DIAGNOSIS — Z7985 Long-term (current) use of injectable non-insulin antidiabetic drugs: Secondary | ICD-10-CM | POA: Diagnosis not present

## 2023-02-01 MED ORDER — TIRZEPATIDE 15 MG/0.5ML ~~LOC~~ SOAJ
15.0000 mg | SUBCUTANEOUS | 0 refills | Status: DC
  Filled 2023-02-25: qty 2, 28d supply, fill #0

## 2023-02-02 ENCOUNTER — Other Ambulatory Visit (HOSPITAL_COMMUNITY): Payer: Self-pay

## 2023-02-09 NOTE — Progress Notes (Unsigned)
Chief Complaint:   OBESITY Gloria [name] is here to discuss her progress with her obesity treatment plan along with follow-up of her obesity related diagnoses. Gloria [name] is on the Category 2 Plan and states she is following her eating plan approximately 85% of the time. Gloria [name] states she is swimming for 40 minutes 2 times per week.  Today's visit was #: 75 Starting weight: 274 lbs Starting date: 08/07/2021 Today's weight: 237 lbs Today's date: 02/01/2023 Total lbs lost to date: 37 Total lbs lost since last in-office visit: 3  Interim History: Gloria [name] continues to work on on her diet and portion control, and she is doing well with meeting her protein goals.  She would like to look at other dinner options and recipes.  Subjective:   1. Type 2 diabetes mellitus with other specified complication, unspecified whether long term insulin use (HCC) Gloria [name] is doing well with her diet and exercise.  She denies hypoglycemia or nausea.  Assessment/Plan:   1. Type 2 diabetes mellitus with other specified complication, unspecified whether long term insulin use (HCC) Gloria [name] will continue Mounjaro 15 mg once weekly, and we will refill for 1 month.  - tirzepatide (MOUNJARO) 15 MG/0.5ML Pen; Inject 15 mg into the skin once a week.  Dispense: 2 mL; Refill: 0  2. BMI 39.0-39.9,adult  3. Obesity, Beginning BMI 45.60 Gloria [name] is currently in the action stage of change. As such, her goal is to continue with weight loss efforts. She has agreed to the Category 2 Plan.   High protein recipes were discussed.   Exercise goals: As is.   Behavioral modification strategies: increasing lean protein intake.  Gloria [name] has agreed to follow-up with our clinic in 4 weeks. She was informed of the importance of frequent follow-up visits to maximize her success with intensive lifestyle modifications for her multiple health conditions.   Objective:   Blood pressure (!) 154/65, pulse 70, temperature 97.9 F (36.6 C),  height 5\' 5"  (1.651 m), weight 237 lb (107.5 kg), last menstrual period 04/25/2013, SpO2 97 %. Body mass index is 39.44 kg/m.  Lab Results  Component Value Date   CREATININE 0.58 07/14/2022   BUN 7 07/14/2022   NA 133 (L) 07/14/2022   K 4.3 07/14/2022   CL 97 07/14/2022   CO2 23 07/14/2022   Lab Results  Component Value Date   ALT 22 07/14/2022   AST 19 07/14/2022   ALKPHOS 79 07/14/2022   BILITOT <0.2 07/14/2022   Lab Results  Component Value Date   HGBA1C 5.6 07/14/2022   HGBA1C 5.6 12/28/2021   HGBA1C 6.2 (H) 08/07/2021   HGBA1C 6.3 (A) 05/20/2021   HGBA1C 5.7 (H) 09/19/2020   Lab Results  Component Value Date   INSULIN 41.2 (H) 07/14/2022   INSULIN 30.5 (H) 12/28/2021   INSULIN 29.5 (H) 08/07/2021   Lab Results  Component Value Date   TSH 2.860 08/07/2021   Lab Results  Component Value Date   CHOL 216 (H) 07/14/2022   HDL 44 07/14/2022   LDLCALC 139 (H) 07/14/2022   LDLDIRECT 145.0 07/18/2019   TRIG 185 (H) 07/14/2022   CHOLHDL 6.1 (H) 09/19/2020   Lab Results  Component Value Date   VD25OH 64.0 07/14/2022   VD25OH 60.0 12/28/2021   VD25OH 45.2 08/07/2021   Lab Results  Component Value Date   WBC 10.4 08/07/2021   HGB 13.2 08/07/2021   HCT 40.2 08/07/2021   MCV 89 08/07/2021   PLT 284 08/07/2021   Lab Results  Component Value Date   IRON 54 10/06/2017   TIBC 304 10/06/2017   FERRITIN 151 (H) 10/06/2017   Attestation Statements:   Reviewed by clinician on day of visit: allergies, medications, problem list, medical history, surgical history, family history, social history, and previous encounter notes.  Time spent on visit including pre-visit chart review and post-visit care and charting was 30 minutes.   I, Trixie Dredge, am acting as transcriptionist for Dennard Nip, MD.  I have reviewed the above documentation for accuracy and completeness, and I agree with the above. -  Dennard Nip, MD

## 2023-02-16 ENCOUNTER — Ambulatory Visit: Payer: 59 | Admitting: Adult Health

## 2023-02-16 ENCOUNTER — Encounter: Payer: Self-pay | Admitting: Adult Health

## 2023-02-16 VITALS — BP 135/61 | HR 70 | Ht 65.0 in | Wt 245.0 lb

## 2023-02-16 DIAGNOSIS — G4733 Obstructive sleep apnea (adult) (pediatric): Secondary | ICD-10-CM

## 2023-02-16 NOTE — Progress Notes (Signed)
PATIENT: Gloria [name] DOB: 05/17/1971  REASON FOR VISIT: follow up HISTORY FROM: patient PRIMARY NEUROLOGIST: Dr. Brett Fairy  HISTORY OF PRESENT ILLNESS: Today 02/16/23:  Gloria [name] is a 52 y.o. female with a history of OSA on CPAP. Returns today for follow-up.  Her download indicates that she used her machine nightly for compliance of 100%.  On average she uses her machine 7 hours.  Residual AHI is 0.4 on 6 to 13 cm of water.  Her leak in the 95th percentile is 14.3.  Her pressure in the 95th percentile was 10.7.  She reports that some nights she is woken up with the pressure blowing strongly.  She has tried mask refitting's in the past.  Most nights she feels that the mask fits fine.     08/11/22: Gloria [name] is a 52 year old female with a history of OSA on CPAP.  She returns today for follow-up.  Her download indicates that she used her machine nightly.  She uses her machine greater than 4 hours 7.8 hours.  Her residual AHI is 0.4 on 6 to 13 cm of water.  The patient reports that she uses the [employer]Wear full facemask.  She states that she is having bleeding gums and trouble with her tooth.  Her dentist feels that this is related to CPAP.  Patient also reports that she has chronic nasal congestion particularl marks "so I y in the left nare.  She has never seen ENT for this.  She returns today for an evaluation.  08/11/21: Gloria [name] is a 52 year old female with a history of obstructive sleep apnea on CPAP.  She returns today for follow-up.  Her download indicates that she used her machine 23 out of 30 days for compliance of 77%.  She use her machine greater than 4 hours 17 days for compliance of 57%.  On average she uses her machine 5 hours and 4 minutes.  Her residual AHI is 0.2 on 6 to 13 cm of water.  Her leak in the 95th percentile is 9.8 L/min.  Patient reports that she has been having trouble with her mask leaking during the night.  She has had a mask refitting.  Reports now if she  pulls her straps tight initially it typically prevents the mask from leaking and she is able to use it longer.  HISTORY .(Copied from Dr.Dohmeier's note) Gloria [name] is a 53 year- old  Caucasian female patient of Dr.Willis' and seen here upon his and NP's request  on 09/23/2020 for a sleep consultation.   Chief concern according to patient :  " I am chronically fatigued but also a recovering alcoholic, and my last sleep study was about 12 years ago- and I don't know how my baseline has changed since".        I have the pleasure of seeing Gloria [name] today, a right-handed White or Caucasian female with a possible sleep disorder.  She   has a past medical history of Allergy, Anemia, Anxiety, Arthritis, Bulging disc, Chronic active hepatitis ([place]), Chronic back pain (02/25/2015), Chronic headaches, Chronic kidney disease, Depression, Diverticulosis, Elevated LFTs, Fibromyalgia, GERD (gastroesophageal reflux disease), History of ETOH abuse, Hyperlipidemia, Hyperplastic colon polyp, Internal hemorrhoids, NAFLD (nonalcoholic fatty liver disease), Obese, Obstructive sleep apnea (02/25/2015), Osteoarthritis, Periodic limb movement disorder (02/25/2014), Pre-diabetes, RLS (restless legs syndrome), Sleep apnea, Smoker, and Substance abuse ([place]).   The patient had the first sleep study in the year 2007 . Sleep relevant medical history:  Recovering alcoholic a with sleep walking in childhood,   no tonsillectomy, had cervical spine surgery/ anterior access, Dr Joya Salm.    Family medical /sleep history:  father had RLS, alcoholic, he passed I  Q000111Q, brother with OSA, mother with Insomnia..    Social history:  Patient is working in Press photographer / at [employer] and lives in a household alone.  Family status is single  The patient currently works daytime.  Her dog present. Tobacco use- no.  ETOH use - not in years. , Caffeine intake in form of Coffee( 2 cups ) . Regular exercise in form of walking her  dog     Sleep habits are as follows: The patient's dinner time is between 6-7 PM. The patient goes to bed at 10.15 PM and continues to sleep for several hours -wakes from gasping if she is in supine position-  No RLS.  The preferred sleep position is laterally, with the support of several  pillows.  Dreams are reportedly frequent.  6.15 AM is the usual rise time. The patient wakes up with an alarm.   She reports not feeling refreshed or restored in AM, with symptoms such as dry mouth , morning headaches, soreness and achiness, and residual fatigue. Naps are taken on weekends frequently, lasting from 2-2.5 hours and are morerefreshing than nocturnal sleep.  These don't interfere with nocturnal sleep.      I reviewed the patient's most recent lab tests through her primary care provider Morton Amy.  Hb A1c has been reduced to 5.6, her TSH is normal T4 is normal lipid panel shows hyperlipidemia hypercholesterolemia, INR was 1.0 and ALT AST were normal.   REVIEW OF SYSTEMS: Out of a complete 14 system review of symptoms, the patient complains only of the following symptoms, and all other reviewed systems are negative.    ALLERGIES: Allergies  Allergen Reactions   Benadryl [Diphenhydramine Hcl] Hives   Ropinirole Other (See Comments)   Levofloxacin Other (See Comments)   Aspirin    Codeine    Doxycycline    Metformin And Related     Blurred vision   Nuvigil [Armodafinil]     migraines    Requip [Ropinirole Hcl]     Increased headache   Semaglutide Other (See Comments)    Rybelsus caused depression and fibromyalgia flair.   Tramadol     Other reaction(s): Other (See Comments)   Wellbutrin [Bupropion]    Zolpidem     HOME MEDICATIONS: Outpatient Medications Prior to Visit  Medication Sig Dispense Refill   azelastine (ASTELIN) 0.1 % nasal spray Place 2 sprays into both nostrils 2 (two) times daily. 30 mL 12   B Complex Vitamins (B COMPLEX PO) Take 1 tablet by mouth daily.      blood glucose meter kit and supplies KIT Dispense based on patient and insurance preference. Use up to four times daily as directed. (FOR ICD-9 250.00, 250.01). 1 each 0   Blood Glucose Monitoring Suppl (ONETOUCH VERIO FLEX SYSTEM) w/Device KIT Use to check Blood sugars twice daily before meals 1 kit 0   Brexpiprazole (REXULTI) 4 MG TABS Take by mouth.     Calcium Carbonate-Vitamin D 600-400 MG-UNIT tablet Take 1 tablet by mouth daily.     clotrimazole-betamethasone (LOTRISONE) cream Apply 1 application topically 2 (two) times daily. For 10 days maximum 45 g 1   Coenzyme Q10 (COQ10) 100 MG CAPS Take 100 mg by mouth daily.     diclofenac (VOLTAREN) 75 MG EC tablet Take 75  mg by mouth 2 (two) times daily.     estradiol (VIVELLE-DOT) 0.05 MG/24HR patch Place 1 patch (0.05 mg total) onto the skin 2 (two) times a week. 24 patch 4   fluticasone (CUTIVATE) 0.05 % cream Apply topically 2 (two) times daily.     glucose blood (CONTOUR NEXT TEST) test strip Use to test blood glucose twice daily 100 each 5   hydrOXYzine (ATARAX) 10 MG tablet Take 20 mg by mouth daily as needed.     ibuprofen (ADVIL) 800 MG tablet TAKE 1 TABLET(800 MG) BY MOUTH EVERY 8 HOURS AS NEEDED FOR MODERATE PAIN 30 tablet 3   ketoconazole (NIZORAL) 2 % cream Apply 1 application topically daily.     Lancets (ONETOUCH DELICA PLUS Q000111Q) MISC USE 1  TO CHECK GLUCOSE 4 TIMES DAILY 100 each 0   loratadine (CLARITIN) 10 MG tablet Take 10 mg by mouth daily.     LORazepam (ATIVAN) 0.5 MG tablet Take 0.5 mg by mouth daily.     Magnesium 500 MG CAPS Take by mouth.     metroNIDAZOLE (METROCREAM) 0.75 % cream daily     Multiple Vitamins-Minerals (MULTIVITAMIN PO) Take 1 tablet by mouth daily.      OMEGA 3-6-9 FATTY ACIDS PO Take 500 mg by mouth daily.     oxcarbazepine (TRILEPTAL) 600 MG tablet Take 600 mg by mouth 2 (two) times daily.     pantoprazole (PROTONIX) 40 MG tablet TAKE 1 TABLET BY MOUTH DAILY (Patient taking differently: as  needed.) 90 tablet 0   rizatriptan (MAXALT) 10 MG tablet Take 1 tablet (10 mg total) by mouth as needed for migraine. 10 tablet 5   thiamine (VITAMIN B-1) 50 MG tablet Take 50 mg by mouth daily.     tirzepatide (MOUNJARO) 15 MG/0.5ML Pen Inject 15 mg into the skin once a week. 2 mL 0   vitamin C (ASCORBIC ACID) 500 MG tablet Take 500 mg by mouth daily.     VITAMIN E PO Take 900 mg by mouth daily.     benzonatate (TESSALON) 200 MG capsule Take 1 capsule (200 mg total) by mouth 2 (two) times daily as needed for cough. 20 capsule 0   No facility-administered medications prior to visit.    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   DDD (degenerative disc disease), lumbar    Depression    Diabetes ([place])    Diverticulosis    Drug use    Elevated LFTs    Fibromyalgia    GERD (gastroesophageal reflux disease)    History of ETOH abuse    Hyperlipidemia    Hyperplastic colon polyp    Internal hemorrhoids    Migraines    NAFLD (nonalcoholic fatty liver disease)    Obese    Obstructive sleep apnea 02/25/2015   not diagnosed per pt 04-03-20   Osteoarthritis    lower back- DDD   Periodic limb movement disorder 02/25/2014   Pre-diabetes    pt taking metformin   RLS (restless legs syndrome)  Sleep apnea    Smoker    Snoring    Substance abuse ([place])    Swallowing difficulty    Vitamin B12 deficiency    Vitamin D deficiency     PAST SURGICAL HISTORY: Past Surgical History:  Procedure Laterality Date   CERVICAL FUSION     COLONOSCOPY     CYSTOSCOPY  06/06/2013   Procedure: CYSTOSCOPY;  Surgeon: Terrance Mass, MD;  Location: [facility];   Service: Gynecology;;   EXCISION METACARPAL MASS Right 09/03/2021   Procedure: EXCISION METACARPAL MASS RIGHT SMALL FINGER;  Surgeon: Daryll Brod, MD;  Location: [facility];  Service: Orthopedics;  Laterality: Right;   INTRAUTERINE DEVICE INSERTION  08/09/2008   PARAGUARD- removed   kidney surgery  1995   abcess   LAPAROSCOPY N/A 06/06/2013   Procedure: LAPAROSCOPY DIAGNOSTIC;  Surgeon: Terrance Mass, MD;  Location: [facility];  Service: Gynecology;  Laterality: N/A;   LAPAROTOMY  06/06/2013   Procedure: EXPLORATORY LAPAROTOMY;  Surgeon: Terrance Mass, MD;  Location: [facility];  Service: Gynecology;;   LIVER BIOPSY     radial tunnel Bilateral 2005   VAGINAL HYSTERECTOMY Left 06/06/2013   Procedure: Vaginal Hysterectomy with Patiial Right Salpingectomy;  Surgeon: Terrance Mass, MD;  Location: [facility];  Service: Gynecology;  Laterality: Left;    FAMILY HISTORY: Family History  Problem Relation Age of Onset   Cancer Mother    Breast cancer Mother    Depression Mother    Anxiety disorder Mother    Obesity Mother    Sleep apnea Mother    Obesity Father    Anxiety disorder Father    Cancer Father    Hyperlipidemia Father    Colon polyps Father    Diabetes Father    Cirrhosis Father    Liver cancer Father    Liver disease Father    Alcoholism Father    Depression Father    Alcohol abuse Father    Sleep apnea Father    Irritable bowel syndrome Sister    Sleep apnea Brother    Breast cancer Maternal Aunt    Alcoholism Maternal Aunt    Alcoholism Maternal Grandmother    Alcoholism Maternal Grandfather    Breast cancer Paternal Grandmother    Colon cancer Paternal Grandmother    Esophageal cancer Paternal Grandfather    Stomach cancer Paternal Grandfather    Alcoholism Paternal Grandfather    Rectal cancer Neg Hx     SOCIAL HISTORY: Social History   Socioeconomic History   Marital status: Divorced    Spouse name: Not on file   Number of children: 1    Years of education: 13   Highest education level: Associate degree: academic program  Occupational History   Occupation: ACCOUNTING   Occupation: Animal nutritionist  Tobacco Use   Smoking status: Former    Packs/day: 1    Types: Cigarettes    Quit date: 01/26/2012    Years since quitting: 11.0   Smokeless tobacco: Never  Vaping Use   Vaping Use: Never used  Substance and Sexual Activity   Alcohol use: No    Alcohol/week: 0.0 standard drinks of alcohol    Comment: History of alcohol abuse   Drug use: No   Sexual activity: Not Currently    Partners: Male    Birth control/protection: Surgical, Abstinence    Comment: hysterectomy  Other Topics Concern   Not on file  Social History Narrative   Patient lives at home and  works full time.   Education some college.   Right handed.   Caffeine: 2 cups daily.   Social Determinants of Health   Financial Resource Strain: Low Risk  (07/21/2022)   Overall Financial Resource Strain (CARDIA)    Difficulty of Paying Living Expenses: Not very hard  Food Insecurity: No Food Insecurity (07/21/2022)   Hunger Vital Sign    Worried About Running Out of Food in the Last Year: Never true    Ran Out of Food in the Last Year: Never true  Transportation Needs: No Transportation Needs (07/21/2022)   PRAPARE - Hydrologist (Medical): No    Lack of Transportation (Non-Medical): No  Physical Activity: Insufficiently Active (07/21/2022)   Exercise Vital Sign    Days of Exercise per Week: 2 days    Minutes of Exercise per Session: 20 min  Stress: No Stress Concern Present (07/21/2022)   [place]    Feeling of Stress : Only a little  Social Connections: Moderately Integrated (07/21/2022)   Social Connection and Isolation Panel [NHANES]    Frequency of Communication with Friends and Family: More than three times a week    Frequency of Social Gatherings with Friends  and Family: More than three times a week    Attends Religious Services: More than 4 times per year    Active Member of Clubs or Organizations: Yes    Attends Archivist Meetings: More than 4 times per year    Marital Status: Divorced  Intimate Partner Violence: Not on file      PHYSICAL EXAM  Vitals:   02/16/23 1119 02/16/23 1125  BP: (!) 157/79 135/61  Pulse: 68 70  Weight: 245 lb (111.1 kg)   Height: 5\' 5"  (1.651 m)    Body mass index is 40.77 kg/m.  Generalized: Well developed, in no acute distress  Chest: Lungs clear to auscultation bilaterally  Neurological examination  Mentation: Alert oriented to time, place, history taking. Follows all commands speech and language fluent Cranial nerve II-XII: Facial symmetry noted Gait and station: Gait is normal.    DIAGNOSTIC DATA (LABS, IMAGING, TESTING) - I reviewed patient records, labs, notes, testing and imaging myself where available.  Lab Results  Component Value Date   WBC 10.4 08/07/2021   HGB 13.2 08/07/2021   HCT 40.2 08/07/2021   MCV 89 08/07/2021   PLT 284 08/07/2021      Component Value Date/Time   NA 133 (L) 07/14/2022 0934   K 4.3 07/14/2022 0934   CL 97 07/14/2022 0934   CO2 23 07/14/2022 0934   GLUCOSE 107 (H) 07/14/2022 0934   GLUCOSE 139 (H) 09/19/2020 0847   BUN 7 07/14/2022 0934   CREATININE 0.58 07/14/2022 0934   CREATININE 0.61 09/19/2020 0847   CALCIUM 9.1 07/14/2022 0934   PROT 6.8 07/14/2022 0934   ALBUMIN 4.2 07/14/2022 0934   AST 19 07/14/2022 0934   ALT 22 07/14/2022 0934   ALKPHOS 79 07/14/2022 0934   BILITOT <0.2 07/14/2022 0934   GFRNONAA 106 09/19/2020 0847   GFRAA 123 09/19/2020 0847   Lab Results  Component Value Date   CHOL 216 (H) 07/14/2022   HDL 44 07/14/2022   LDLCALC 139 (H) 07/14/2022   LDLDIRECT 145.0 07/18/2019   TRIG 185 (H) 07/14/2022   CHOLHDL 6.1 (H) 09/19/2020   Lab Results  Component Value Date   HGBA1C 5.6 07/14/2022   Lab Results  Component Value Date   [id number] 08/07/2021   Lab Results  Component Value Date   TSH 2.860 08/07/2021      ASSESSMENT AND PLAN 52 y.o. year old female  has a past medical history of Alcohol abuse, Allergy, Anemia, Anxiety, Arthritis, Bilateral swelling of feet, Bulging disc, Chronic active hepatitis (HCC), Chronic back pain (02/25/2015), Chronic fatigue syndrome, Chronic headaches, Chronic kidney disease, DDD (degenerative disc disease), lumbar, Depression, Diabetes ([place]), Diverticulosis, Drug use, Elevated LFTs, Fibromyalgia, GERD (gastroesophageal reflux disease), History of ETOH abuse, Hyperlipidemia, Hyperplastic colon polyp, Internal hemorrhoids, Migraines, NAFLD (nonalcoholic fatty liver disease), Obese, Obstructive sleep apnea (02/25/2015), Osteoarthritis, Periodic limb movement disorder (02/25/2014), Pre-diabetes, RLS (restless legs syndrome), Sleep apnea, Smoker, Snoring, Substance abuse ([place]), Swallowing difficulty, Vitamin B12 deficiency, and Vitamin D deficiency. here with:  OSA on CPAP  - CPAP compliance excellent - Good treatment of AHI  - Encourage patient to use CPAP nightly and > 4 hours each night -Advised that we could adjust her pressure settings either by reducing to 6-12 or giving her a set pressure of 10.  For now the patient deferred will let us know in the future if she wants this change. - F/U in 6 months or sooner if needed   Ward Givens, MSN, NP-C 02/16/2023, 11:40 AM [facility] [address] [place], [place] [address] [phone]

## 2023-02-25 ENCOUNTER — Other Ambulatory Visit (HOSPITAL_COMMUNITY): Payer: Self-pay

## 2023-02-26 ENCOUNTER — Other Ambulatory Visit (HOSPITAL_COMMUNITY): Payer: Self-pay

## 2023-02-26 DIAGNOSIS — G8929 Other chronic pain: Secondary | ICD-10-CM | POA: Insufficient documentation

## 2023-03-01 ENCOUNTER — Other Ambulatory Visit (HOSPITAL_COMMUNITY): Payer: Self-pay

## 2023-03-01 ENCOUNTER — Encounter (INDEPENDENT_AMBULATORY_CARE_PROVIDER_SITE_OTHER): Payer: Self-pay | Admitting: Family Medicine

## 2023-03-01 ENCOUNTER — Ambulatory Visit (INDEPENDENT_AMBULATORY_CARE_PROVIDER_SITE_OTHER): Payer: 59 | Admitting: Family Medicine

## 2023-03-01 VITALS — BP 149/68 | HR 68 | Temp 98.0°F | Ht 65.0 in | Wt 240.0 lb

## 2023-03-01 DIAGNOSIS — I10 Essential (primary) hypertension: Secondary | ICD-10-CM

## 2023-03-01 DIAGNOSIS — Z6841 Body Mass Index (BMI) 40.0 and over, adult: Secondary | ICD-10-CM

## 2023-03-01 DIAGNOSIS — E669 Obesity, unspecified: Secondary | ICD-10-CM

## 2023-03-01 DIAGNOSIS — E1169 Type 2 diabetes mellitus with other specified complication: Secondary | ICD-10-CM

## 2023-03-01 DIAGNOSIS — Z7985 Long-term (current) use of injectable non-insulin antidiabetic drugs: Secondary | ICD-10-CM

## 2023-03-01 MED ORDER — TIRZEPATIDE 15 MG/0.5ML ~~LOC~~ SOAJ: 15.0000 mg | SUBCUTANEOUS | 0 refills | Status: DC

## 2023-03-02 ENCOUNTER — Other Ambulatory Visit: Payer: Self-pay | Admitting: Family Medicine

## 2023-03-02 DIAGNOSIS — Z8669 Personal history of other diseases of the nervous system and sense organs: Secondary | ICD-10-CM

## 2023-03-02 NOTE — Progress Notes (Signed)
Chief Complaint:   OBESITY Gloria [name] is here to discuss her progress with her obesity treatment plan along with follow-up of her obesity related diagnoses. Gloria [name] is on the Category 2 Plan and states she is following her eating plan approximately 80% of the time. Gloria [name] states she is swimming for 60 minutes 2 times per week.  Today's visit was #: 30 Starting weight: 274 lbs Starting date: 08/07/2021 Today's weight: 240 lbs Today's date: 03/01/2023 Total lbs lost to date: 34 Total lbs lost since last in-office visit: 0  Interim History: Avah continues to work on on her diet and exercise.  Her weight is up partially due to increased water weight and also increased muscle mass.  She is working on meeting her protein goals.  Subjective:   1. Type 2 diabetes mellitus with other specified complication, unspecified whether long term insulin use Gloria [name] is working on her diet, and she is tolerating Mounjaro well but there is a medication shortage currently.  2. Essential hypertension Gloria [name]'s blood pressure is elevated today, and it tends to be elevated at our office but not in other doctors offices.  She denies chest pain or headache.  Assessment/Plan:   1. Type 2 diabetes mellitus with other specified complication, unspecified whether long term insulin use We will refill Mounjaro 15 mg for 1 month.  Gloria [name] is okay to try other pharmacies in case they have it in stock.  May have to change to Ozempic if shortages continue.  - tirzepatide (MOUNJARO) 15 MG/0.5ML Pen; Inject 15 mg into the skin once a week.  Dispense: 2 mL; Refill: 0  2. Essential hypertension Gloria [name] will continue to work on her diet and exercise, and she is to discuss this with her PCP.  3. BMI 40.0-44.9, adult  4. Obesity, Beginning BMI 45.60 Gloria [name] is currently in the action stage of change. As such, her goal is to continue with weight loss efforts. She has agreed to the Category 2 Plan.   Exercise goals: As  is.   Behavioral modification strategies: increasing lean protein intake and meal planning and cooking strategies.  Gloria [name] has agreed to follow-up with our clinic in 4 weeks. She was informed of the importance of frequent follow-up visits to maximize her success with intensive lifestyle modifications for her multiple health conditions.   Objective:   Blood pressure (!) 149/68, pulse 68, temperature 98 F (36.7 C), height 5\' 5"  (1.651 m), weight 240 lb (108.9 kg), last menstrual period 04/25/2013, SpO2 96 %. Body mass index is 39.94 kg/m.  Lab Results  Component Value Date   CREATININE 0.58 07/14/2022   BUN 7 07/14/2022   NA 133 (L) 07/14/2022   K 4.3 07/14/2022   CL 97 07/14/2022   CO2 23 07/14/2022   Lab Results  Component Value Date   ALT 22 07/14/2022   AST 19 07/14/2022   ALKPHOS 79 07/14/2022   BILITOT <0.2 07/14/2022   Lab Results  Component Value Date   HGBA1C 5.6 07/14/2022   HGBA1C 5.6 12/28/2021   HGBA1C 6.2 (H) 08/07/2021   HGBA1C 6.3 (A) 05/20/2021   HGBA1C 5.7 (H) 09/19/2020   Lab Results  Component Value Date   INSULIN 41.2 (H) 07/14/2022   INSULIN 30.5 (H) 12/28/2021   INSULIN 29.5 (H) 08/07/2021   Lab Results  Component Value Date   TSH 2.860 08/07/2021   Lab Results  Component Value Date   CHOL 216 (H) 07/14/2022   HDL 44 07/14/2022   LDLCALC 139 (H)  07/14/2022   LDLDIRECT 145.0 07/18/2019   TRIG 185 (H) 07/14/2022   CHOLHDL 6.1 (H) 09/19/2020   Lab Results  Component Value Date   VD25OH 64.0 07/14/2022   VD25OH 60.0 12/28/2021   VD25OH 45.2 08/07/2021   Lab Results  Component Value Date   WBC 10.4 08/07/2021   HGB 13.2 08/07/2021   HCT 40.2 08/07/2021   MCV 89 08/07/2021   PLT 284 08/07/2021   Lab Results  Component Value Date   IRON 54 10/06/2017   TIBC 304 10/06/2017   FERRITIN 151 (H) 10/06/2017   Attestation Statements:   Reviewed by clinician on day of visit: allergies, medications, problem list, medical history,  surgical history, family history, social history, and previous encounter notes.   I, Burt Knack, am acting as transcriptionist for Quillian Quince, MD.  I have reviewed the above documentation for accuracy and completeness, and I agree with the above. -  Quillian Quince, MD

## 2023-03-05 ENCOUNTER — Telehealth: Payer: 59

## 2023-03-14 LAB — HM DIABETES EYE EXAM

## 2023-03-29 ENCOUNTER — Ambulatory Visit (INDEPENDENT_AMBULATORY_CARE_PROVIDER_SITE_OTHER): Payer: 59 | Admitting: Family Medicine

## 2023-04-03 ENCOUNTER — Encounter: Payer: Self-pay | Admitting: Adult Health

## 2023-04-03 DIAGNOSIS — G4733 Obstructive sleep apnea (adult) (pediatric): Secondary | ICD-10-CM

## 2023-04-04 NOTE — Telephone Encounter (Signed)
Per Aundra Millet NP, verbal order placed to change autopap pressure to 6-12 cm H20. I have send the order over to [facility].

## 2023-04-05 ENCOUNTER — Ambulatory Visit (INDEPENDENT_AMBULATORY_CARE_PROVIDER_SITE_OTHER): Payer: 59 | Admitting: Family Medicine

## 2023-04-05 ENCOUNTER — Encounter (INDEPENDENT_AMBULATORY_CARE_PROVIDER_SITE_OTHER): Payer: Self-pay | Admitting: Family Medicine

## 2023-04-05 VITALS — BP 166/84 | HR 66 | Temp 97.8°F | Ht 65.0 in | Wt 235.0 lb

## 2023-04-05 DIAGNOSIS — R030 Elevated blood-pressure reading, without diagnosis of hypertension: Secondary | ICD-10-CM | POA: Diagnosis not present

## 2023-04-05 DIAGNOSIS — E669 Obesity, unspecified: Secondary | ICD-10-CM | POA: Diagnosis not present

## 2023-04-05 DIAGNOSIS — Z6839 Body mass index (BMI) 39.0-39.9, adult: Secondary | ICD-10-CM | POA: Diagnosis not present

## 2023-04-05 DIAGNOSIS — E1169 Type 2 diabetes mellitus with other specified complication: Secondary | ICD-10-CM

## 2023-04-05 DIAGNOSIS — Z7985 Long-term (current) use of injectable non-insulin antidiabetic drugs: Secondary | ICD-10-CM

## 2023-04-05 MED ORDER — TIRZEPATIDE 15 MG/0.5ML ~~LOC~~ SOAJ
15.0000 mg | SUBCUTANEOUS | 0 refills | Status: DC
Start: 2023-04-05 — End: 2023-05-03
  Filled 2023-04-27: qty 2, 28d supply, fill #0

## 2023-04-05 NOTE — Telephone Encounter (Signed)
[facility] confirmed receipt of order.  

## 2023-04-06 NOTE — Progress Notes (Signed)
Chief Complaint:   OBESITY Gloria [name] is here to discuss her progress with her obesity treatment plan along with follow-up of her obesity related diagnoses. Gloria [name] is on the Category 2 Plan and states she is following her eating plan approximately 80% of the time. Gloria [name] states she is doing pool exercises for 30 minutes 2 times per week.  Today's visit was #: 31 Starting weight: 274 lbs Starting date: 08/07/2021 Today's weight: 235 lbs Today's date: 04/05/2023 Total lbs lost to date: 39 Total lbs lost since last in-office visit: 5  Interim History: Gloria [name] continues to do well with her weight loss.  She has increased her exercise and she is working on following her category 2 plan.  Her hunger is mostly controlled.  She is working on increasing protein and increasing her exercise, especially with increasing swimming.  Subjective:   1. Type 2 diabetes mellitus with other specified complication, unspecified whether long term insulin use (HCC) Gloria [name] is working on decreasing simple carbohydrates and increasing her exercise.  She is doing well on Mounjaro.  2. Elevated blood pressure reading without diagnosis of hypertension Gloria [name]'s blood pressure is elevated today.  She has a history of elevated blood pressure which may be whitecoat hypertension.  She is working on her diet and weight loss to improve.  Assessment/Plan:   1. Type 2 diabetes mellitus with other specified complication, unspecified whether long term insulin use (HCC) Gloria [name] will continue Mounjaro at 15 mg once weekly, and we will refill for 1 month.  - tirzepatide (MOUNJARO) 15 MG/0.5ML Pen; Inject 15 mg into the skin once a week.  Dispense: 2 mL; Refill: 0  2. Elevated blood pressure reading without diagnosis of hypertension We will continue to monitor, and Gloria [name] is to check her blood pressure at home to see if it is elevated when she is not rushing to get to the clinic.  3. BMI 39.0-39.9,adult  4. Obesity,  Beginning BMI 45.60 Gloria [name] is currently in the action stage of change. As such, her goal is to continue with weight loss efforts. She has agreed to the Category 2 Plan.   Exercise goals: As is.   Behavioral modification strategies: increasing lean protein intake.  Gloria [name] has agreed to follow-up with our clinic in 4 weeks. She was informed of the importance of frequent follow-up visits to maximize her success with intensive lifestyle modifications for her multiple health conditions.   Objective:   Blood pressure (!) 166/84, pulse 66, temperature 97.8 F (36.6 C), height 5\' 5"  (1.651 m), weight 235 lb (106.6 kg), last menstrual period 04/25/2013, SpO2 98 %. Body mass index is 39.11 kg/m.  Lab Results  Component Value Date   CREATININE 0.58 07/14/2022   BUN 7 07/14/2022   NA 133 (L) 07/14/2022   K 4.3 07/14/2022   CL 97 07/14/2022   CO2 23 07/14/2022   Lab Results  Component Value Date   ALT 22 07/14/2022   AST 19 07/14/2022   ALKPHOS 79 07/14/2022   BILITOT <0.2 07/14/2022   Lab Results  Component Value Date   HGBA1C 5.6 07/14/2022   HGBA1C 5.6 12/28/2021   HGBA1C 6.2 (H) 08/07/2021   HGBA1C 6.3 (A) 05/20/2021   HGBA1C 5.7 (H) 09/19/2020   Lab Results  Component Value Date   INSULIN 41.2 (H) 07/14/2022   INSULIN 30.5 (H) 12/28/2021   INSULIN 29.5 (H) 08/07/2021   Lab Results  Component Value Date   TSH 2.860 08/07/2021   Lab Results  Component Value  Date   CHOL 216 (H) 07/14/2022   HDL 44 07/14/2022   LDLCALC 139 (H) 07/14/2022   LDLDIRECT 145.0 07/18/2019   TRIG 185 (H) 07/14/2022   CHOLHDL 6.1 (H) 09/19/2020   Lab Results  Component Value Date   VD25OH 64.0 07/14/2022   VD25OH 60.0 12/28/2021   VD25OH 45.2 08/07/2021   Lab Results  Component Value Date   WBC 10.4 08/07/2021   HGB 13.2 08/07/2021   HCT 40.2 08/07/2021   MCV 89 08/07/2021   PLT 284 08/07/2021   Lab Results  Component Value Date   IRON 54 10/06/2017   TIBC 304 10/06/2017    FERRITIN 151 (H) 10/06/2017   Attestation Statements:   Reviewed by clinician on day of visit: allergies, medications, problem list, medical history, surgical history, family history, social history, and previous encounter notes.   I, Burt Knack, am acting as transcriptionist for Quillian Quince, MD.  I have reviewed the above documentation for accuracy and completeness, and I agree with the above. -  Quillian Quince, MD

## 2023-04-27 ENCOUNTER — Other Ambulatory Visit (HOSPITAL_BASED_OUTPATIENT_CLINIC_OR_DEPARTMENT_OTHER): Payer: Self-pay

## 2023-05-03 ENCOUNTER — Ambulatory Visit (INDEPENDENT_AMBULATORY_CARE_PROVIDER_SITE_OTHER): Payer: 59 | Admitting: Family Medicine

## 2023-05-03 ENCOUNTER — Encounter (INDEPENDENT_AMBULATORY_CARE_PROVIDER_SITE_OTHER): Payer: Self-pay | Admitting: Family Medicine

## 2023-05-03 VITALS — BP 175/90 | HR 66 | Temp 97.7°F | Ht 65.0 in | Wt 240.0 lb

## 2023-05-03 DIAGNOSIS — Z6839 Body mass index (BMI) 39.0-39.9, adult: Secondary | ICD-10-CM

## 2023-05-03 DIAGNOSIS — E1169 Type 2 diabetes mellitus with other specified complication: Secondary | ICD-10-CM | POA: Diagnosis not present

## 2023-05-03 DIAGNOSIS — I10 Essential (primary) hypertension: Secondary | ICD-10-CM

## 2023-05-03 DIAGNOSIS — E669 Obesity, unspecified: Secondary | ICD-10-CM | POA: Diagnosis not present

## 2023-05-03 DIAGNOSIS — Z7985 Long-term (current) use of injectable non-insulin antidiabetic drugs: Secondary | ICD-10-CM

## 2023-05-03 MED ORDER — TIRZEPATIDE 15 MG/0.5ML ~~LOC~~ SOAJ: 15.0000 mg | SUBCUTANEOUS | 0 refills | Status: DC

## 2023-05-04 NOTE — Progress Notes (Signed)
**Note Gloria [name]-Identified via Obfuscation** Chief Complaint:   OBESITY Gloria [name] is here to discuss her progress with her obesity treatment plan along with follow-up of her obesity related diagnoses. Gloria [name] is on the Category 2 Plan and states she is following her eating plan approximately 75% of the time. Gloria [name] states she is swimming for 45 minutes 2 times per week.  Today's visit was #: 32 Starting weight: 274 lbs Starting date: 08/07/2021 Today's weight: 240 lbs Today's date: 05/03/2023 Total lbs lost to date: 34 Total lbs lost since last in-office visit: 0  Interim History: Patient has had extra challenges since her last visit.  She has had increased sodium recently and she is retaining some water.  She is continuing her swimming and she is ready to get back on track with her category 2 plan.  Subjective:   1. Essential hypertension Patient's blood pressure is elevated, and she has obstructive sleep apnea and she had her settings adjusted a few weeks ago.  She has had extra sodium in her diet recently. Her second repeat blood pressure reading is also elevated today.  2. Type 2 diabetes mellitus with other specified complication, unspecified whether long term insulin use (HCC) Patient is on Mounjaro and she is working on her diet.  She notes having a headache within 24-48-hours after taking a dose of Mounjaro.  Assessment/Plan:   1. Essential hypertension Patient is to check her blood pressure at home 2 times a day and contact me with her results.  She is to bring in her blood pressure cuff to her next visit to compare results.  2. Type 2 diabetes mellitus with other specified complication, unspecified whether long term insulin use (HCC) We will refill Mounjaro 15 mg for 1 month.  Patient is to get back to following her category 2 eating plan strictly.  She will work on increasing her water intake and we will continue to follow.  - tirzepatide (MOUNJARO) 15 MG/0.5ML Pen; Inject 15 mg into the skin once a week.   Dispense: 2 mL; Refill: 0  3. BMI 39.0-39.9,adult  4. Obesity, Beginning BMI 45.60 Gloria [name] is currently in the action stage of change. As such, her goal is to continue with weight loss efforts. She has agreed to the Category 2 Plan.   Exercise goals: As is.   Behavioral modification strategies: increasing lean protein intake, increasing water intake, and decreasing sodium intake.  Gloria [name] has agreed to follow-up with our clinic in 4 weeks. She was informed of the importance of frequent follow-up visits to maximize her success with intensive lifestyle modifications for her multiple health conditions.   Objective:   Blood pressure (!) 175/90, pulse 66, temperature 97.7 F (36.5 C), height 5\' 5"  (1.651 m), weight 240 lb (108.9 kg), last menstrual period 04/25/2013, SpO2 97 %. Body mass index is 39.94 kg/m.  Lab Results  Component Value Date   CREATININE 0.58 07/14/2022   BUN 7 07/14/2022   NA 133 (L) 07/14/2022   K 4.3 07/14/2022   CL 97 07/14/2022   CO2 23 07/14/2022   Lab Results  Component Value Date   ALT 22 07/14/2022   AST 19 07/14/2022   ALKPHOS 79 07/14/2022   BILITOT <0.2 07/14/2022   Lab Results  Component Value Date   HGBA1C 5.6 07/14/2022   HGBA1C 5.6 12/28/2021   HGBA1C 6.2 (H) 08/07/2021   HGBA1C 6.3 (A) 05/20/2021   HGBA1C 5.7 (H) 09/19/2020   Lab Results  Component Value Date   INSULIN 41.2 (H) 07/14/2022  INSULIN 30.5 (H) 12/28/2021   INSULIN 29.5 (H) 08/07/2021   Lab Results  Component Value Date   TSH 2.860 08/07/2021   Lab Results  Component Value Date   CHOL 216 (H) 07/14/2022   HDL 44 07/14/2022   LDLCALC 139 (H) 07/14/2022   LDLDIRECT 145.0 07/18/2019   TRIG 185 (H) 07/14/2022   CHOLHDL 6.1 (H) 09/19/2020   Lab Results  Component Value Date   VD25OH 64.0 07/14/2022   VD25OH 60.0 12/28/2021   VD25OH 45.2 08/07/2021   Lab Results  Component Value Date   WBC 10.4 08/07/2021   HGB 13.2 08/07/2021   HCT 40.2 08/07/2021   MCV 89  08/07/2021   PLT 284 08/07/2021   Lab Results  Component Value Date   IRON 54 10/06/2017   TIBC 304 10/06/2017   FERRITIN 151 (H) 10/06/2017   Attestation Statements:   Reviewed by clinician on day of visit: allergies, medications, problem list, medical history, surgical history, family history, social history, and previous encounter notes.   I, Burt Knack, am acting as transcriptionist for Quillian Quince, MD.  I have reviewed the above documentation for accuracy and completeness, and I agree with the above. -  Quillian Quince, MD

## 2023-05-05 ENCOUNTER — Other Ambulatory Visit (INDEPENDENT_AMBULATORY_CARE_PROVIDER_SITE_OTHER): Payer: Self-pay | Admitting: Family Medicine

## 2023-05-05 ENCOUNTER — Encounter (INDEPENDENT_AMBULATORY_CARE_PROVIDER_SITE_OTHER): Payer: Self-pay | Admitting: Family Medicine

## 2023-05-05 MED ORDER — LISINOPRIL-HYDROCHLOROTHIAZIDE 10-12.5 MG PO TABS: 1.0000 | ORAL_TABLET | Freq: Every day | ORAL | 3 refills | Status: DC

## 2023-05-06 ENCOUNTER — Ambulatory Visit: Payer: 59 | Admitting: Family Medicine

## 2023-05-10 ENCOUNTER — Encounter (INDEPENDENT_AMBULATORY_CARE_PROVIDER_SITE_OTHER): Payer: Self-pay | Admitting: Family Medicine

## 2023-05-10 ENCOUNTER — Telehealth (INDEPENDENT_AMBULATORY_CARE_PROVIDER_SITE_OTHER): Payer: Self-pay | Admitting: *Deleted

## 2023-05-10 NOTE — Telephone Encounter (Signed)
Patient called stating she has been feeling lightheaded, groggy and like she is in a fog for the last 2 days. She has not been able to sleep for the last 2 night. She started taking the Lisinopril/hydrochlorothiazide on Friday. She took her BP this morning and it was 126/79 and then she took the medication, while on the phone she took her BP, it was 142/86. Per patient she called the pharmacist prior to calling our office, the pharmacist told her she maybe better taking Lisinopril without the hydrochlorothiazide. Please advise

## 2023-05-10 NOTE — Telephone Encounter (Signed)
Have her make sure to drink at least 80 oz of water per day and decrease her lisinopril to 1/2 pill. She needs to check her BP 1x per day or anytime she is feeling lightheaded and report back to [place].

## 2023-05-11 ENCOUNTER — Ambulatory Visit (INDEPENDENT_AMBULATORY_CARE_PROVIDER_SITE_OTHER): Payer: 59 | Admitting: Family Medicine

## 2023-05-11 ENCOUNTER — Encounter: Payer: Self-pay | Admitting: Family Medicine

## 2023-05-11 VITALS — BP 124/70 | HR 73 | Temp 98.8°F | Wt 239.8 lb

## 2023-05-11 DIAGNOSIS — I10 Essential (primary) hypertension: Secondary | ICD-10-CM | POA: Diagnosis not present

## 2023-05-11 DIAGNOSIS — T50905A Adverse effect of unspecified drugs, medicaments and biological substances, initial encounter: Secondary | ICD-10-CM

## 2023-05-11 MED ORDER — OLMESARTAN MEDOXOMIL 20 MG PO TABS
10.0000 mg | ORAL_TABLET | Freq: Every day | ORAL | 1 refills | Status: DC
Start: 2023-05-11 — End: 2023-07-11

## 2023-05-11 NOTE — Progress Notes (Signed)
   Established Patient Office Visit   Subjective  Patient ID: HADEEL MENSINGER, female    DOB: 03-04-1971  Age: 52 y.o. MRN: [id number]  Chief Complaint  Patient presents with   Allergic Reaction    BP has been elevated. Wt management put er on med, started it on fri, and things started getting bad.  Eye sight not good, constantly drinking water, in a brain fog, trouble concentrating, anxious, very tired, mood swings, dry cough,    Patient is a 52 year old female seen for acute concern.  Patient followed by weight management.  Recently started on lisinopril-hydrochlorothiazide 10-12.5 mg for elevated BP readings during visits.  Patient states medication caused fatigue, dry cough, emotional liability, insomnia, dry eyes, brain fog.  Patient did not take medication today.  Noticing some improvement in the cough.  Has been unable to wear CPAP due to symptoms.  Feeling increased anxiety.  BP readings at home 162/96 on 6/11, 162/94, 139/93, 126/79, 142/86, 131/77, 125/82 today.      ROS Negative unless stated above    Objective:     BP 124/70 (BP Location: Right Wrist, Patient Position: Sitting, Cuff Size: Normal)   Pulse 73   Temp 98.8 F (37.1 C) (Oral)   Wt 239 lb 12.8 oz (108.8 kg)   LMP 04/25/2013   SpO2 96%   BMI 39.90 kg/m    Physical Exam Constitutional:      General: She is not in acute distress.    Appearance: Normal appearance.     Comments: Drowsy.  HENT:     Head: Normocephalic and atraumatic.     Nose: Nose normal.     Mouth/Throat:     Mouth: Mucous membranes are moist.  Cardiovascular:     Rate and Rhythm: Normal rate and regular rhythm.     Heart sounds: Normal heart sounds. No murmur heard.    No gallop.  Pulmonary:     Effort: Pulmonary effort is normal. No respiratory distress.     Breath sounds: No wheezing, rhonchi or rales.  Skin:    General: Skin is warm and dry.  Neurological:     Mental Status: She is alert and oriented to person, place, and  time.      No results found for any visits on 05/11/23.    Assessment & Plan:  Essential hypertension -     Olmesartan Medoxomil; Take 0.5 tablets (10 mg total) by mouth daily. Take half of a 20 mg tab (10 mg) daily  Dispense: 45 tablet; Refill: 1  Adverse effect of drug, initial encounter  Will discontinue olmesartan-hydrochlorothiazide 10-12.5 mg daily due to allergy-caused fatigue, dry cough, brain fog, insomnia, emotional liability.  Patient to increase p.o. intake of water and fluids.  Start olmesartan 10 mg daily.  Monitor BP.  Contact clinic for continued or new symptoms.  Return in about 6 weeks (around 06/22/2023) for blood pressure re-check.   Deeann Saint, MD

## 2023-05-18 NOTE — Telephone Encounter (Signed)
Please see duplicate encounter

## 2023-05-20 ENCOUNTER — Ambulatory Visit: Payer: 59 | Admitting: Family Medicine

## 2023-05-24 ENCOUNTER — Encounter (INDEPENDENT_AMBULATORY_CARE_PROVIDER_SITE_OTHER): Payer: Self-pay | Admitting: Family Medicine

## 2023-05-24 ENCOUNTER — Ambulatory Visit (INDEPENDENT_AMBULATORY_CARE_PROVIDER_SITE_OTHER): Payer: 59 | Admitting: Family Medicine

## 2023-05-24 VITALS — BP 142/84 | HR 73 | Temp 98.0°F | Ht 65.0 in | Wt 234.0 lb

## 2023-05-24 DIAGNOSIS — Z6838 Body mass index (BMI) 38.0-38.9, adult: Secondary | ICD-10-CM | POA: Diagnosis not present

## 2023-05-24 DIAGNOSIS — Z7985 Long-term (current) use of injectable non-insulin antidiabetic drugs: Secondary | ICD-10-CM

## 2023-05-24 DIAGNOSIS — Z6839 Body mass index (BMI) 39.0-39.9, adult: Secondary | ICD-10-CM

## 2023-05-24 DIAGNOSIS — I10 Essential (primary) hypertension: Secondary | ICD-10-CM

## 2023-05-24 DIAGNOSIS — E669 Obesity, unspecified: Secondary | ICD-10-CM | POA: Diagnosis not present

## 2023-05-24 DIAGNOSIS — E1169 Type 2 diabetes mellitus with other specified complication: Secondary | ICD-10-CM

## 2023-05-24 DIAGNOSIS — Z6836 Body mass index (BMI) 36.0-36.9, adult: Secondary | ICD-10-CM | POA: Insufficient documentation

## 2023-05-24 MED ORDER — TIRZEPATIDE 15 MG/0.5ML ~~LOC~~ SOAJ
15.0000 mg | SUBCUTANEOUS | 0 refills | Status: DC
  Filled 2023-05-27: qty 2, 28d supply, fill #0

## 2023-05-24 NOTE — Progress Notes (Unsigned)
Chief Complaint:   OBESITY Gloria [name] is here to discuss her progress with her obesity treatment plan along with follow-up of her obesity related diagnoses. Gloria [name] is on the Category 2 Plan and states she is following her eating plan approximately 80% of the time. Gloria [name] states she is swimming for 40 minutes 2 times per week.  Today's visit was #: 33 Starting weight: 274 lbs Starting date: 08/07/2021 Today's weight: 234 lbs Today's date: 05/24/2023 Total lbs lost to date: 40 Total lbs lost since last in-office visit: 6  Interim History: Patient has done well with her weight loss.  She has increased swimming labs and the bioimpedance scale shows that she has gained a bit of muscle with her increased activity.  Subjective:   1. Type 2 diabetes mellitus with other specified complication, unspecified whether long term insulin use (HCC) Patient is stable on Mounjaro with no side effects noted.  She is also controlling her glucose with her diet and exercise  2. Essential hypertension Patient's blood pressure medications were changed to Benicar.  Her blood pressure is still above goal but it has only been 2 weeks.  Assessment/Plan:   1. Type 2 diabetes mellitus with other specified complication, unspecified whether long term insulin use (HCC) Patient will continue Mounjaro 15 mg once weekly, and we will refill for 1 month.  - tirzepatide (MOUNJARO) 15 MG/0.5ML Pen; Inject 15 mg into the skin once a week.  Dispense: 2 mL; Refill: 0  2. Essential hypertension Patient will continue her medications as prescribed, and we will keep working on her diet and weight loss to improve her blood pressure.  3. BMI 39.0-39.9,adult  4. Obesity, Beginning BMI 45.60 Gloria [name] is currently in the action stage of change. As such, her goal is to continue with weight loss efforts. She has agreed to the Category 2 Plan.   Exercise goals: As is.   Behavioral modification strategies: increasing lean protein  intake and travel eating strategies.  Gloria [name] has agreed to follow-up with our clinic in 4 weeks. She was informed of the importance of frequent follow-up visits to maximize her success with intensive lifestyle modifications for her multiple health conditions.   Objective:   Blood pressure (!) 142/84, pulse 73, temperature 98 F (36.7 C), height 5\' 5"  (1.651 m), weight 234 lb (106.1 kg), last menstrual period 04/25/2013, SpO2 98 %. Body mass index is 38.94 kg/m.  Lab Results  Component Value Date   CREATININE 0.58 07/14/2022   BUN 7 07/14/2022   NA 133 (L) 07/14/2022   K 4.3 07/14/2022   CL 97 07/14/2022   CO2 23 07/14/2022   Lab Results  Component Value Date   ALT 22 07/14/2022   AST 19 07/14/2022   ALKPHOS 79 07/14/2022   BILITOT <0.2 07/14/2022   Lab Results  Component Value Date   HGBA1C 5.6 07/14/2022   HGBA1C 5.6 12/28/2021   HGBA1C 6.2 (H) 08/07/2021   HGBA1C 6.3 (A) 05/20/2021   HGBA1C 5.7 (H) 09/19/2020   Lab Results  Component Value Date   INSULIN 41.2 (H) 07/14/2022   INSULIN 30.5 (H) 12/28/2021   INSULIN 29.5 (H) 08/07/2021   Lab Results  Component Value Date   TSH 2.860 08/07/2021   Lab Results  Component Value Date   CHOL 216 (H) 07/14/2022   HDL 44 07/14/2022   LDLCALC 139 (H) 07/14/2022   LDLDIRECT 145.0 07/18/2019   TRIG 185 (H) 07/14/2022   CHOLHDL 6.1 (H) 09/19/2020   Lab Results  Component Value Date   VD25OH 64.0 07/14/2022   VD25OH 60.0 12/28/2021   VD25OH 45.2 08/07/2021   Lab Results  Component Value Date   WBC 10.4 08/07/2021   HGB 13.2 08/07/2021   HCT 40.2 08/07/2021   MCV 89 08/07/2021   PLT 284 08/07/2021   Lab Results  Component Value Date   IRON 54 10/06/2017   TIBC 304 10/06/2017   FERRITIN 151 (H) 10/06/2017   Attestation Statements:   Reviewed by clinician on day of visit: allergies, medications, problem list, medical history, surgical history, family history, social history, and previous encounter  notes.   I, Burt Knack, am acting as transcriptionist for Quillian Quince, MD.  I have reviewed the above documentation for accuracy and completeness, and I agree with the above. -  Quillian Quince, MD

## 2023-05-27 ENCOUNTER — Other Ambulatory Visit (HOSPITAL_COMMUNITY): Payer: Self-pay

## 2023-06-06 ENCOUNTER — Encounter: Payer: Self-pay | Admitting: Family Medicine

## 2023-06-21 ENCOUNTER — Ambulatory Visit (INDEPENDENT_AMBULATORY_CARE_PROVIDER_SITE_OTHER): Payer: 59 | Admitting: Family Medicine

## 2023-06-21 ENCOUNTER — Encounter (INDEPENDENT_AMBULATORY_CARE_PROVIDER_SITE_OTHER): Payer: Self-pay | Admitting: Family Medicine

## 2023-06-21 VITALS — BP 138/71 | HR 62 | Temp 98.0°F | Ht 65.0 in | Wt 235.0 lb

## 2023-06-21 DIAGNOSIS — E559 Vitamin D deficiency, unspecified: Secondary | ICD-10-CM

## 2023-06-21 DIAGNOSIS — E669 Obesity, unspecified: Secondary | ICD-10-CM

## 2023-06-21 DIAGNOSIS — E782 Mixed hyperlipidemia: Secondary | ICD-10-CM | POA: Diagnosis not present

## 2023-06-21 DIAGNOSIS — Z7985 Long-term (current) use of injectable non-insulin antidiabetic drugs: Secondary | ICD-10-CM

## 2023-06-21 DIAGNOSIS — G4709 Other insomnia: Secondary | ICD-10-CM

## 2023-06-21 DIAGNOSIS — Z6839 Body mass index (BMI) 39.0-39.9, adult: Secondary | ICD-10-CM

## 2023-06-21 DIAGNOSIS — E1169 Type 2 diabetes mellitus with other specified complication: Secondary | ICD-10-CM

## 2023-06-21 MED ORDER — TIRZEPATIDE 15 MG/0.5ML ~~LOC~~ SOAJ
15.0000 mg | SUBCUTANEOUS | 0 refills | Status: DC
Start: 2023-06-21 — End: 2023-07-20

## 2023-06-21 NOTE — Progress Notes (Unsigned)
Chief Complaint:   OBESITY Gloria [name] is here to discuss her progress with her obesity treatment plan along with follow-up of her obesity related diagnoses. Gloria [name] is on the Category 2 Plan and states she is following her eating plan approximately 90% of the time. Gloria [name] states she is swimming for 40 minutes 2 times per week.  Today's visit was #: 34 Starting weight: 274 lbs Starting date: 08/07/2021 Today's weight: 235 lbs Today's date: 06/21/2023 Total lbs lost to date: 39 Total lbs lost since last in-office visit: 0  Interim History: Patient is retaining some water weight today. She has done well with her exercise and she is following her plan most days. Her hunger is controlled.   Subjective:   1. Type 2 diabetes mellitus with other specified complication, unspecified whether long term insulin use (Gloria [name]) Patient is on Gloria [name], and she is working on her diet. She is due for labs soon, and she will be getting them checked at her PCP's office.   2. Other insomnia Patient was changed from Gloria [name] to Gloria [name]. She feels she is doing ok on this change.   3. Mixed hyperlipidemia Patient is working on her diet, and she is due for labs. She denies chest pain.   4. Vitamin D deficiency Patient is on Vitamin D, with no signs of over-replacement.   Assessment/Plan:   1. Type 2 diabetes mellitus with other specified complication, unspecified whether long term insulin use (Gloria [name]) We will refill Gloria [name] 15 mg once weekly for 1 month. Patient will have labs checked with her PCP.   - tirzepatide (Gloria [name]) 15 MG/0.5ML Pen; Inject 15 mg into the skin once a week.  Dispense: 2 mL; Refill: 0  2. Other insomnia Patient will continue Gloria [name], and the importance of sleep to help with weight loss was discussed.   3. Mixed hyperlipidemia Patient will continue to work on her diet, and she will have labs checked with her PCP.   4. Vitamin D deficiency Patient will have her labs checked with her  PCP, and we will continue to follow.   5. BMI 39.0-39.9,adult  6. Obesity, Beginning BMI 45.60 Gloria [name] is currently in the action stage of change. As such, her goal is to continue with weight loss efforts. She has agreed to the Category 2 Plan.   Exercise goals: As is.   Behavioral modification strategies: travel eating strategies.  [place] has agreed to follow-up with our clinic in 4 weeks. She was informed of the importance of frequent follow-up visits to maximize her success with intensive lifestyle modifications for her multiple health conditions.   Objective:   Blood pressure 138/71, pulse 62, temperature 98 F (36.7 C), height 5\' 5"  (1.651 m), weight 235 lb (106.6 kg), last menstrual period 04/25/2013, SpO2 97%. Body mass index is 39.11 kg/m.  Lab Results  Component Value Date   CREATININE 0.58 07/14/2022   BUN 7 07/14/2022   NA 133 (L) 07/14/2022   K 4.3 07/14/2022   CL 97 07/14/2022   CO2 23 07/14/2022   Lab Results  Component Value Date   ALT 22 07/14/2022   AST 19 07/14/2022   ALKPHOS 79 07/14/2022   BILITOT <0.2 07/14/2022   Lab Results  Component Value Date   HGBA1C 5.6 07/14/2022   HGBA1C 5.6 12/28/2021   HGBA1C 6.2 (H) 08/07/2021   HGBA1C 6.3 (A) 05/20/2021   HGBA1C 5.7 (H) 09/19/2020   Lab Results  Component Value Date   INSULIN 41.2 (H) 07/14/2022  INSULIN 30.5 (H) 12/28/2021   INSULIN 29.5 (H) 08/07/2021   Lab Results  Component Value Date   TSH 2.860 08/07/2021   Lab Results  Component Value Date   CHOL 216 (H) 07/14/2022   HDL 44 07/14/2022   LDLCALC 139 (H) 07/14/2022   LDLDIRECT 145.0 07/18/2019   TRIG 185 (H) 07/14/2022   CHOLHDL 6.1 (H) 09/19/2020   Lab Results  Component Value Date   VD25OH 64.0 07/14/2022   VD25OH 60.0 12/28/2021   VD25OH 45.2 08/07/2021   Lab Results  Component Value Date   WBC 10.4 08/07/2021   HGB 13.2 08/07/2021   HCT 40.2 08/07/2021   MCV 89 08/07/2021   PLT 284 08/07/2021   Lab Results   Component Value Date   IRON 54 10/06/2017   TIBC 304 10/06/2017   FERRITIN 151 (H) 10/06/2017   Attestation Statements:   Reviewed by clinician on day of visit: allergies, medications, problem list, medical history, surgical history, family history, social history, and previous encounter notes.   I, Burt Knack, am acting as transcriptionist for Quillian Quince, MD.  I have reviewed the above documentation for accuracy and completeness, and I agree with the above. -  Quillian Quince, MD

## 2023-06-22 ENCOUNTER — Encounter (INDEPENDENT_AMBULATORY_CARE_PROVIDER_SITE_OTHER): Payer: Self-pay

## 2023-06-24 ENCOUNTER — Encounter: Payer: 59 | Admitting: Family Medicine

## 2023-06-27 ENCOUNTER — Telehealth: Payer: Self-pay | Admitting: Family Medicine
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
## 2023-06-29 ENCOUNTER — Telehealth: Payer: 59 | Admitting: Family Medicine

## 2023-07-03 NOTE — Telephone Encounter (Signed)
Matter discussed elsewhere.

## 2023-07-09 ENCOUNTER — Observation Stay (HOSPITAL_BASED_OUTPATIENT_CLINIC_OR_DEPARTMENT_OTHER)
Admission: EM | Admit: 2023-07-09 | Discharge: 2023-07-10 | Disposition: A | Discharge status: Home / Self Care | Payer: 59 | Attending: Internal Medicine | Admitting: Internal Medicine

## 2023-07-09 ENCOUNTER — Emergency Department (HOSPITAL_BASED_OUTPATIENT_CLINIC_OR_DEPARTMENT_OTHER): Payer: 59

## 2023-07-09 ENCOUNTER — Encounter (HOSPITAL_BASED_OUTPATIENT_CLINIC_OR_DEPARTMENT_OTHER): Payer: Self-pay | Admitting: Emergency Medicine

## 2023-07-09 ENCOUNTER — Other Ambulatory Visit: Payer: Self-pay

## 2023-07-09 DIAGNOSIS — E119 Type 2 diabetes mellitus without complications: Secondary | ICD-10-CM | POA: Insufficient documentation

## 2023-07-09 DIAGNOSIS — D649 Anemia, unspecified: Secondary | ICD-10-CM | POA: Insufficient documentation

## 2023-07-09 DIAGNOSIS — Z87891 Personal history of nicotine dependence: Secondary | ICD-10-CM | POA: Insufficient documentation

## 2023-07-09 DIAGNOSIS — K219 Gastro-esophageal reflux disease without esophagitis: Secondary | ICD-10-CM | POA: Diagnosis present

## 2023-07-09 DIAGNOSIS — E871 Hypo-osmolality and hyponatremia: Principal | ICD-10-CM | POA: Insufficient documentation

## 2023-07-09 DIAGNOSIS — H6693 Otitis media, unspecified, bilateral: Secondary | ICD-10-CM | POA: Diagnosis not present

## 2023-07-09 DIAGNOSIS — R0602 Shortness of breath: Secondary | ICD-10-CM | POA: Insufficient documentation

## 2023-07-09 DIAGNOSIS — I10 Essential (primary) hypertension: Secondary | ICD-10-CM | POA: Diagnosis not present

## 2023-07-09 DIAGNOSIS — F418 Other specified anxiety disorders: Secondary | ICD-10-CM | POA: Diagnosis present

## 2023-07-09 DIAGNOSIS — R55 Syncope and collapse: Secondary | ICD-10-CM | POA: Diagnosis present

## 2023-07-09 LAB — BASIC METABOLIC PANEL
Anion gap: 7 (ref 5–15)
BUN: 6 mg/dL (ref 6–20)
CO2: 25 mmol/L (ref 22–32)
Calcium: 8.5 mg/dL — ABNORMAL LOW (ref 8.9–10.3)
Chloride: 96 mmol/L — ABNORMAL LOW (ref 98–111)
Creatinine, Ser: 0.55 mg/dL (ref 0.44–1.00)
GFR, Estimated: >60 mL/min (ref >60)
Glucose, Bld: 83 mg/dL (ref 70–99)
Potassium: 4.1 mmol/L (ref 3.5–5.1)
Sodium: 128 mmol/L — ABNORMAL LOW (ref 135–145)

## 2023-07-09 LAB — BRAIN NATRIURETIC PEPTIDE: B Natriuretic Peptide: 26.1 pg/mL (ref 0.0–100.0)

## 2023-07-09 LAB — URINALYSIS, ROUTINE W REFLEX MICROSCOPIC
Bilirubin Urine: NEGATIVE
Glucose, UA: NEGATIVE mg/dL
Hgb urine dipstick: NEGATIVE
Ketones, ur: NEGATIVE mg/dL
Leukocytes,Ua: NEGATIVE
Nitrite: NEGATIVE
Protein, ur: NEGATIVE mg/dL
Specific Gravity, Urine: 1.014 (ref 1.005–1.030)
pH: 7.5 (ref 5.0–8.0)

## 2023-07-09 LAB — COMPREHENSIVE METABOLIC PANEL
ALT: 17 U/L (ref 0–44)
AST: 17 U/L (ref 15–41)
Albumin: 4.3 g/dL (ref 3.5–5.0)
Alkaline Phosphatase: 55 U/L (ref 38–126)
Anion gap: 11 (ref 5–15)
BUN: 7 mg/dL (ref 6–20)
CO2: 21 mmol/L — ABNORMAL LOW (ref 22–32)
Calcium: 9 mg/dL (ref 8.9–10.3)
Chloride: 95 mmol/L — ABNORMAL LOW (ref 98–111)
Creatinine, Ser: 0.54 mg/dL (ref 0.44–1.00)
GFR, Estimated: >60 mL/min (ref >60)
Glucose, Bld: 86 mg/dL (ref 70–99)
Potassium: 4 mmol/L (ref 3.5–5.1)
Sodium: 127 mmol/L — ABNORMAL LOW (ref 135–145)
Total Bilirubin: 0.4 mg/dL (ref 0.3–1.2)
Total Protein: 7.6 g/dL (ref 6.5–8.1)

## 2023-07-09 LAB — OSMOLALITY, URINE: Osmolality, Ur: 414 mOsm/kg (ref 300–900)

## 2023-07-09 LAB — CBC WITH DIFFERENTIAL/PLATELET
Abs Immature Granulocytes: 0.03 10*3/uL (ref 0.00–0.07)
Basophils Absolute: 0.1 10*3/uL (ref 0.0–0.1)
Basophils Relative: 1 %
Eosinophils Absolute: 0.1 10*3/uL (ref 0.0–0.5)
Eosinophils Relative: 1 %
HCT: 40.7 % (ref 36.0–46.0)
Hemoglobin: 13.9 g/dL (ref 12.0–15.0)
Immature Granulocytes: 0 %
Lymphocytes Relative: 13 %
Lymphs Abs: 1 10*3/uL (ref 0.7–4.0)
MCH: 29.4 pg (ref 26.0–34.0)
MCHC: 34.2 g/dL (ref 30.0–36.0)
MCV: 86 fL (ref 80.0–100.0)
Monocytes Absolute: 0.7 10*3/uL (ref 0.1–1.0)
Monocytes Relative: 9 %
Neutro Abs: 6.3 10*3/uL (ref 1.7–7.7)
Neutrophils Relative %: 76 %
Platelets: 257 10*3/uL (ref 150–400)
RBC: 4.73 MIL/uL (ref 3.87–5.11)
RDW: 13.2 % (ref 11.5–15.5)
WBC: 8.2 10*3/uL (ref 4.0–10.5)
nRBC: 0 % (ref 0.0–0.2)

## 2023-07-09 LAB — OSMOLALITY: Osmolality: 271 mOsm/kg — ABNORMAL LOW (ref 275–295)

## 2023-07-09 MED ORDER — SODIUM CHLORIDE 0.9 % IV BOLUS
1000.0000 mL | Freq: Once | INTRAVENOUS | Status: AC
  Administered 2023-07-09: 1000 mL via INTRAVENOUS

## 2023-07-09 NOTE — ED Notes (Signed)
Pt unable to give urine sample at this time 

## 2023-07-09 NOTE — ED Triage Notes (Signed)
Pt went to UC yesterday, wasn't feeling well after cruise. Had double ear infection, fluid in R lung from XR, BLE swelling. Given ABX and Lasix and Tessalon Perles for cough. Na 125-resulted today and sent here.   Pt states she doesn't feel bad today, just lightheaded.

## 2023-07-09 NOTE — Progress Notes (Signed)
Received a call from [facility] ED provider regarding this patient to discuss care.  Patient recently was traveling and developed double ear infection and had lower extremity swelling.  Went to urgent care yesterday and was prescribed Augmentin, Lasix and Tessalon Perles.  At that time her sodium levels were 125.  After going home she continued feel little lightheaded and dizzy therefore comes back to the hospital. In [facility] ED patient sodium was 127, slightly low chloride.  Serum osmolality was low as well.  She was given normal saline bolus.  Overall she felt better.  At this time I am recommending repeating BMP, if sodium levels are improving patient can be discharged with outpatient follow-up with PCP and repeat blood work in next 3-5 days. If Na levels are between 127-130, she can be given 3-4 tabs of 1g Na tabs to taken over next 1-2 days. If Na >130, no further inptn management is necessary at this time. Regardless she should hold off on lasix.    I suspect this dehydration, hypovol HypoNa is due to Lasix.   Despite a bolus, her sodium level drops down and she gets lightheaded patient will require overnight admission for further evaluation and management.  At this time we will hold off on admitting the patient.  EDP will call back if admission is necessary.   Stephania Fragmin MD [facility]

## 2023-07-09 NOTE — ED Notes (Signed)
Pt unable to give urine sample

## 2023-07-09 NOTE — Progress Notes (Addendum)
  Plan of Care Note for accepted transfer  Patient: Gloria [name]              NFA:[id number]  DOA: 07/09/2023     Facility requesting transfer: [facility] Requesting Provider: Eugenia Pancoast, PA-C  Reason for transfer: Monitor for improvement of hyponatremia.  Facility course:  52 year old female medical history of DM type II, essential hypertension and OSA (use CPAP at home) presented to emergency department with complaining of dizziness earlier today.  She has a recent travel and developed some ER infection with associated lower extremity swelling.  At outpatient urgent care she was prescribed Augmentin, Lasix and Tessalon Perle.  At that time her sodium level was 125.  However patient continues to have lightheadedness and dizziness therefore came to the hospital for evaluation.  At presentation to ED patient is hemodynamically stable.  Orthostatic negative.  CMP showed low sodium 127 which improved to 1 1:28 liter of NS bolus.  Low serum osmolarity 271, normal urine osmolarity 441 and need to check urine sodium.  Low chloride 95 and low bicarb 21. CBC grossly unremarkable. BNP 26. Chest x-ray no disease process.  Per chart review patient baseline sodium is around 133. Patient is is worried to discharge from the ED as she will not able to see her primary care doctor before Wednesday. Discussed case with EDP physician and agree with her that patient need hospital admission at least for observation for 24-hour for improvement of serum sodium level close to her baseline.  Believe that development of hyponatremia in the setting of combination of losartan and Lasix as well as patient drinking plenty of water.  Plan of care: Continue to monitor improvement of sodium level.   The patient is accepted for admission to Med-surg  unit, at [facility] or [facility].    Check [email or web address] for on-call coverage.  [facility] will assume care on arrival to accepting  facility. Until arrival, medical decision making responsibilities remain with the EDP.  However, [facility] available 24/7 for questions and assistance.   Nursing staff please page [facility] Admits and Consults [phone]) as soon as the patient arrives to the hospital.    Author: Tereasa Coop, MD  07/09/2023  [facility]

## 2023-07-10 DIAGNOSIS — E871 Hypo-osmolality and hyponatremia: Secondary | ICD-10-CM | POA: Diagnosis not present

## 2023-07-10 DIAGNOSIS — D649 Anemia, unspecified: Secondary | ICD-10-CM | POA: Diagnosis not present

## 2023-07-10 DIAGNOSIS — Z87891 Personal history of nicotine dependence: Secondary | ICD-10-CM | POA: Diagnosis not present

## 2023-07-10 DIAGNOSIS — I10 Essential (primary) hypertension: Secondary | ICD-10-CM | POA: Diagnosis not present

## 2023-07-10 DIAGNOSIS — R0602 Shortness of breath: Secondary | ICD-10-CM | POA: Diagnosis not present

## 2023-07-10 DIAGNOSIS — E119 Type 2 diabetes mellitus without complications: Secondary | ICD-10-CM | POA: Diagnosis not present

## 2023-07-10 DIAGNOSIS — R55 Syncope and collapse: Secondary | ICD-10-CM | POA: Diagnosis present

## 2023-07-10 DIAGNOSIS — H6693 Otitis media, unspecified, bilateral: Secondary | ICD-10-CM | POA: Diagnosis not present

## 2023-07-10 LAB — BASIC METABOLIC PANEL
Anion gap: 12 (ref 5–15)
BUN: 6 mg/dL (ref 6–20)
CO2: 22 mmol/L (ref 22–32)
Calcium: 8.6 mg/dL — ABNORMAL LOW (ref 8.9–10.3)
Chloride: 95 mmol/L — ABNORMAL LOW (ref 98–111)
Creatinine, Ser: 0.59 mg/dL (ref 0.44–1.00)
GFR, Estimated: >60 mL/min (ref >60)
Glucose, Bld: 120 mg/dL — ABNORMAL HIGH (ref 70–99)
Potassium: 3.6 mmol/L (ref 3.5–5.1)
Sodium: 129 mmol/L — ABNORMAL LOW (ref 135–145)

## 2023-07-10 LAB — CBG MONITORING, ED: Glucose-Capillary: 92 mg/dL (ref 70–99)

## 2023-07-10 LAB — GLUCOSE, CAPILLARY: Glucose-Capillary: 127 mg/dL — ABNORMAL HIGH (ref 70–99)

## 2023-07-10 MED ORDER — INSULIN ASPART 100 UNIT/ML IJ SOLN: 0.0000 [IU] | Freq: Every day | INTRAMUSCULAR | Status: DC

## 2023-07-10 MED ORDER — ACETAMINOPHEN 325 MG PO TABS: 650.0000 mg | ORAL_TABLET | Freq: Four times a day (QID) | ORAL | Status: DC | PRN

## 2023-07-10 MED ORDER — ACETAMINOPHEN 650 MG RE SUPP: 650.0000 mg | Freq: Four times a day (QID) | RECTAL | Status: DC | PRN

## 2023-07-10 MED ORDER — INSULIN ASPART 100 UNIT/ML IJ SOLN: 0.0000 [IU] | Freq: Three times a day (TID) | INTRAMUSCULAR | Status: DC

## 2023-07-10 MED ORDER — SODIUM CHLORIDE 0.9 % IV SOLN: INTRAVENOUS | Status: DC

## 2023-07-10 MED ORDER — ENOXAPARIN SODIUM 40 MG/0.4ML IJ SOSY: 40.0000 mg | PREFILLED_SYRINGE | INTRAMUSCULAR | Status: DC

## 2023-07-10 MED ORDER — ACETAMINOPHEN 500 MG PO TABS
1000.0000 mg | ORAL_TABLET | Freq: Once | ORAL | Status: AC
  Administered 2023-07-10: 1000 mg via ORAL
  Filled 2023-07-10: qty 2

## 2023-07-10 NOTE — Progress Notes (Signed)
DISCHARGE NOTE HOME Scherry R Barrette to be discharged Home per MD order. Diagnosis, treatment, s/s, medications, and follow up appointments discussed with patient who verbalized knowledge and understanding of all. Skin clean, dry and intact without evidence of skin break down, no evidence of skin tears noted. IV discontinued with tip intact. VSS.  Patient free of lines, drains, and wounds.   An After Visit Summary (AVS) was printed and given to the patient.   Tresa Endo, RN

## 2023-07-10 NOTE — Discharge Summary (Signed)
Physician Discharge Summary  Gloria [name] ZDG:[id number] DOB: 1971-06-26 DOA: 07/09/2023  PCP: Deeann Saint, MD  Admit date: 07/09/2023 Discharge date: 07/10/2023  Admitted From: Home Disposition: Home  Recommendations for Outpatient Follow-up:  Follow up with PCP as scheduled this upcoming week Please obtain BMP at next visit to reassess sodium level   Home Health: No Equipment/Devices: None  Discharge Condition: Stable CODE STATUS: Full code Diet recommendation: Heart healthy/consistent carb regular diet  History of present illness:  Gloria [name] is a 52 y.o. female with medical history significant of type 2 diabetes, hypertension, hyperlipidemia, OSA on CPAP, fibromyalgia, alcohol abuse in remission, anxiety, depression, chronic pain, GERD presenting with a chief complaint lightheadedness.  Patient states she recently went on a cruise ship and returned not feeling well.  She went to urgent care 2 days ago and had workup with lab work and chest x-ray.  She was diagnosed with bilateral middle ear infection and was placed on Augmentin.  Although patient states she is not sure why she was diagnosed with ear infection as she was not having any problems with her ears such as pain, discharge, or change in hearing.  States she had bilateral lower extremity edema and was told at urgent care that they saw some fluid in her lung as well for which she was prescribed Lasix.  Patient states urgent care called her back yesterday informing her that labs done during the visit the day prior came back showing low sodium and she was advised to come into the ED immediately.  Patient states she took only 1 dose of Lasix yesterday morning which was the day after labs were already drawn at urgent care.  She reports improvement of her bilateral lower extremity edema.  Also reports taking olmesartan for the past one month.  Patient denies seizures or any confusion but does report lightheadedness.  Denies  vomiting or diarrhea.  Reports good p.o. intake.  No other complaints.  Previously on olmesartan/HCTZ combination but that was discontinued by PCP.   In the ED, vital signs stable.  Orthostatics negative.  Labs showing no leukocytosis, sodium 127 (was 126 on labs done at urgent care 2 days ago but baseline appears to be 131-133), BNP normal, serum osmolarity 271, urine osmolarity 414, urine sodium not checked.  Chest x-ray showing no active disease.  Patient was given 1 L normal saline and repeat labs showing sodium 128.  EDP consulted [facility] and patient was transferred to [facility] for further evaluation and management.    Hospital course:  Hyponatremia Patient presenting with a sodium of 126 drawn at urgent care 2 days prior.  Sodium on ED presentation slightly improved to 127.  Patient previously on HCTZ which was discontinued.  Also patient on Trileptal which can be a contributing factor but she reports she has been on this for many years.  Serum osmolality 271, urine osmolality 414.  Patient was started on gentle IV fluid hydration with improvement of sodium to 129 at time of discharge.  Baseline sodium appears to be 131-133.  Patient has upcoming appoint with PCP on Wednesday.  Recommend repeat labs at that appointment.  Patient is asymptomatic.  Type 2 diabetes mellitus Hemoglobin A1c 5.6 on 07/14/2022, well-controlled.  Diet controlled at baseline.  Essential hypertension Continue olmesartan 10 mg p.o. daily  OSA Continue nocturnal CPAP  Questionable ear infection Patient was diagnosed with bilateral middle ear infections during urgent care visit 2 days ago and placed on Augmentin.  Although  patient states not sure why this was diagnosed as she was not have any problems with her ears such as pain, discharge or change in hearing.  ED provider performed otoscopic exam and felt tympanic membrane's were clear bilaterally, patient is afebrile without leukocytosis.  No need to continue  antibiotics at this time.  Obesity Body mass index is 40.17 kg/m.  On Mounjaro.  Complicates all facets of care.  Outpatient follow-up with PCP.   Discharge Diagnoses:  Principal Problem:   Hyponatremia Active Problems:   Depression with anxiety   GERD (gastroesophageal reflux disease)   Diabetes mellitus (HCC)   Essential hypertension    Discharge Instructions  Discharge Instructions     Call MD for:  difficulty breathing, headache or visual disturbances   Complete by: As directed    Call MD for:  extreme fatigue   Complete by: As directed    Call MD for:  persistant dizziness or light-headedness   Complete by: As directed    Call MD for:  persistant nausea and vomiting   Complete by: As directed    Call MD for:  severe uncontrolled pain   Complete by: As directed    Call MD for:  temperature >100.4   Complete by: As directed    Diet - low sodium heart healthy   Complete by: As directed    Increase activity slowly   Complete by: As directed       Allergies as of 07/10/2023       Reactions   Benadryl [diphenhydramine Hcl] Hives   Ropinirole Other (See Comments)   Levofloxacin Other (See Comments)   Aspirin    Codeine    Doxycycline    Lisinopril-hydrochlorothiazide Other (See Comments)   Dry cough, dry eyes, insomnia, emotionally liable, increased thirst, brain fog, fatigue.   Metformin And Related    Blurred vision   Nuvigil [armodafinil]    migraines   Requip [ropinirole Hcl]    Increased headache   Semaglutide Other (See Comments)   Rybelsus caused depression and fibromyalgia flair.   Tramadol    Other reaction(s): Other (See Comments)   Wellbutrin [bupropion]    Zolpidem         Medication List     TAKE these medications    ascorbic acid 500 MG tablet Commonly known as: VITAMIN C Take 500 mg by mouth daily.   azelastine 0.1 % nasal spray Commonly known as: ASTELIN Place 2 sprays into both nostrils 2 (two) times daily.   B COMPLEX  PO Take 1 tablet by mouth daily.   blood glucose meter kit and supplies Kit Dispense based on patient and insurance preference. Use up to four times daily as directed. (FOR ICD-9 250.00, 250.01).   Calcium Carbonate-Vitamin D 600-400 MG-UNIT tablet Take 1 tablet by mouth daily.   clotrimazole-betamethasone cream Commonly known as: Lotrisone Apply 1 application topically 2 (two) times daily. For 10 days maximum   Contour Next Test test strip Generic drug: glucose blood Use to test blood glucose twice daily   CoQ10 100 MG Caps Take 100 mg by mouth daily.   diclofenac 75 MG EC tablet Commonly known as: VOLTAREN Take 75 mg by mouth 2 (two) times daily.   estradiol 0.05 MG/24HR patch Commonly known as: VIVELLE-DOT Place 1 patch (0.05 mg total) onto the skin 2 (two) times a week.   fluticasone 0.05 % cream Commonly known as: CUTIVATE Apply topically 2 (two) times daily.   hydrOXYzine 10 MG tablet Commonly known  as: ATARAX Take 20 mg by mouth daily as needed.   ibuprofen 800 MG tablet Commonly known as: ADVIL TAKE 1 TABLET(800 MG) BY MOUTH EVERY 8 HOURS AS NEEDED FOR MODERATE PAIN   ketoconazole 2 % cream Commonly known as: NIZORAL Apply 1 application topically daily.   loratadine 10 MG tablet Commonly known as: CLARITIN Take 10 mg by mouth daily.   LORazepam 0.5 MG tablet Commonly known as: ATIVAN Take 0.5 mg by mouth daily.   Magnesium 500 MG Caps Take by mouth.   metroNIDAZOLE 0.75 % cream Commonly known as: METROCREAM daily   MULTIVITAMIN PO Take 1 tablet by mouth daily.   olmesartan 20 MG tablet Commonly known as: BENICAR Take 0.5 tablets (10 mg total) by mouth daily. Take half of a 20 mg tab (10 mg) daily   OMEGA 3-6-9 FATTY ACIDS PO Take 500 mg by mouth daily.   OneTouch Delica Plus Lancet30G Misc USE 1  TO CHECK GLUCOSE 4 TIMES DAILY   OneTouch Verio Flex System w/Device Kit Use to check Blood sugars twice daily before meals   oxcarbazepine  600 MG tablet Commonly known as: TRILEPTAL Take 600 mg by mouth 2 (two) times daily.   pantoprazole 40 MG tablet Commonly known as: PROTONIX TAKE 1 TABLET BY MOUTH DAILY What changed:  how much to take how to take this when to take this reasons to take this   Rexulti 4 MG Tabs Generic drug: Brexpiprazole Take by mouth.   rizatriptan 10 MG tablet Commonly known as: MAXALT TAKE 1 TABLET BY MOUTH AS NEEDED FOR MIGRAINE   thiamine 50 MG tablet Commonly known as: VITAMIN B-1 Take 50 mg by mouth daily.   tirzepatide 15 MG/0.5ML Pen Commonly known as: MOUNJARO Inject 15 mg into the skin once a week.   VITAMIN E PO Take 900 mg by mouth daily.        Follow-up Information     Deeann Saint, MD. Go on 07/13/2023.   Specialty: Family Medicine Contact information: [address] [place] [address] [phone]                Allergies  Allergen Reactions   Benadryl [Diphenhydramine Hcl] Hives   Ropinirole Other (See Comments)   Levofloxacin Other (See Comments)   Aspirin    Codeine    Doxycycline    Lisinopril-Hydrochlorothiazide Other (See Comments)    Dry cough, dry eyes, insomnia, emotionally liable, increased thirst, brain fog, fatigue.   Metformin And Related     Blurred vision   Nuvigil [Armodafinil]     migraines    Requip [Ropinirole Hcl]     Increased headache   Semaglutide Other (See Comments)    Rybelsus caused depression and fibromyalgia flair.   Tramadol     Other reaction(s): Other (See Comments)   Wellbutrin [Bupropion]    Zolpidem     Consultations: None   Procedures/Studies: DG Chest Portable 1 View  Result Date: 07/09/2023 CLINICAL DATA:  Cough when she lays flat.  Feels okay otherwise. EXAM: PORTABLE CHEST 1 VIEW COMPARISON:  12/31/2007 FINDINGS: Chronic blunting of the right costophrenic angle likely secondary to scarring. No focal consolidation. No pleural effusion or pneumothorax. Heart and mediastinal contours  are unremarkable. No acute osseous abnormality. IMPRESSION: No active disease. Electronically Signed   By: Elige Ko M.D.   On: 07/09/2023 15:03     Subjective: Patient seen examined bedside, resting calmly.  Eating breakfast.  Just ambulated to the bathroom, asymptomatic.  States ready for discharge home.  No other questions or concerns at this time.  Denies headache, no dizziness, no chest pain, no palpitations, no shortness of breath, no abdominal pain, no fever/chills/night sweats, no nausea cefonicid diarrhea, no focal weakness, no fatigue, no paresthesias.  No acute events overnight per nursing staff.  Discharge Exam: Vitals:   07/10/23 0144 07/10/23 0430  BP: 135/72   Pulse: 83   Resp:  (!) 27  Temp: 98.3 F (36.8 C)   SpO2: 95%    Vitals:   07/09/23 2345 07/10/23 0000 07/10/23 0144 07/10/23 0430  BP: (!) 145/71 (!) 152/73 135/72   Pulse: 81 79 83   Resp: 14 14  (!) 27  Temp:   98.3 F (36.8 C)   TempSrc:      SpO2: 98% 96% 95%   Weight:      Height:        Physical Exam: GEN: NAD, alert and oriented x 3, obese HEENT: NCAT, PERRL, EOMI, sclera clear, MMM PULM: CTAB w/o wheezes/crackles, normal respiratory effort, on room air CV: RRR w/o M/G/R GI: abd soft, NTND, NABS, no R/G/M MSK: no peripheral edema, moves all extremities independently with preserved strength NEURO: CN II-XII intact, no focal deficits, sensation to light touch intact PSYCH: normal mood/affect Integumentary: dry/intact, no rashes or wounds    The results of significant diagnostics from this hospitalization (including imaging, microbiology, ancillary and laboratory) are listed below for reference.     Microbiology: No results found for this or any previous visit (from the past 240 hour(s)).   Labs: BNP (last 3 results) Recent Labs    07/09/23 1418  BNP 26.1   Basic Metabolic Panel: Recent Labs  Lab 07/09/23 1418 07/09/23 1726 07/10/23 0757  NA 127* 128* 129*  K 4.0 4.1 3.6  CL  95* 96* 95*  CO2 21* 25 22  GLUCOSE 86 83 120*  BUN 7 6 6   CREATININE 0.54 0.55 0.59  CALCIUM 9.0 8.5* 8.6*   Liver Function Tests: Recent Labs  Lab 07/09/23 1418  AST 17  ALT 17  ALKPHOS 55  BILITOT 0.4  PROT 7.6  ALBUMIN 4.3   No results for input(s): "LIPASE", "AMYLASE" in the last 168 hours. No results for input(s): "AMMONIA" in the last 168 hours. CBC: Recent Labs  Lab 07/09/23 1418  WBC 8.2  NEUTROABS 6.3  HGB 13.9  HCT 40.7  MCV 86.0  PLT 257   Cardiac Enzymes: No results for input(s): "CKTOTAL", "CKMB", "CKMBINDEX", "TROPONINI" in the last 168 hours. BNP: Invalid input(s): "POCBNP" CBG: Recent Labs  Lab 07/10/23 0102 07/10/23 0734  GLUCAP 92 127*   D-Dimer No results for input(s): "DDIMER" in the last 72 hours. Hgb A1c No results for input(s): "HGBA1C" in the last 72 hours. Lipid Profile No results for input(s): "CHOL", "HDL", "LDLCALC", "TRIG", "CHOLHDL", "LDLDIRECT" in the last 72 hours. Thyroid function studies No results for input(s): "TSH", "T4TOTAL", "T3FREE", "THYROIDAB" in the last 72 hours.  Invalid input(s): "FREET3" Anemia work up No results for input(s): "VITAMINB12", "FOLATE", "FERRITIN", "TIBC", "IRON", "RETICCTPCT" in the last 72 hours. Urinalysis    Component Value Date/Time   COLORURINE YELLOW 07/09/2023 1459   APPEARANCEUR CLEAR 07/09/2023 1459   LABSPEC 1.014 07/09/2023 1459   PHURINE 7.5 07/09/2023 1459   GLUCOSEU NEGATIVE 07/09/2023 1459   HGBUR NEGATIVE 07/09/2023 1459   BILIRUBINUR NEGATIVE 07/09/2023 1459   KETONESUR NEGATIVE 07/09/2023 1459   PROTEINUR NEGATIVE 07/09/2023 1459   UROBILINOGEN 0.2 08/27/2014 1605  NITRITE NEGATIVE 07/09/2023 1459   LEUKOCYTESUR NEGATIVE 07/09/2023 1459   Sepsis Labs Recent Labs  Lab 07/09/23 1418  WBC 8.2   Microbiology No results found for this or any previous visit (from the past 240 hour(s)).   Time coordinating discharge: Over 30 minutes  SIGNED:   Alvira Philips [place],  DO  [facility] 07/10/2023, 9:09 AM

## 2023-07-10 NOTE — Progress Notes (Signed)
RN messaged [facility] admit to make aware pt is from [facility] and needs orders.   Devonne Doughty Latarra Eagleton

## 2023-07-10 NOTE — Progress Notes (Signed)
   07/10/23 0430  BiPAP/CPAP/SIPAP  Reason BIPAP/CPAP not in use Other(comment) (pt not using our machine because she wants humidity.)  BiPAP/CPAP /SiPAP Vitals  Resp (!) 27  MEWS Score/Color  MEWS Score 2  MEWS Score Color Yellow   Instructed patient to have her family bring in her machine if she would like so that she would be able to use CPAP with humidity while here.   Nelda Marseille

## 2023-07-10 NOTE — H&P (Addendum)
History and Physical    Gloria [name] MVH:[id number] DOB: November 28, 1970 DOA: 07/09/2023  PCP: Deeann Saint, MD  Patient coming from: [facility] ED  Chief Complaint: Lightheadedness  HPI: Gloria [name] a 52 y.o. female with medical history significant of type 2 diabetes, hypertension, hyperlipidemia, OSA on CPAP, fibromyalgia, alcohol abuse in remission, anxiety, depression, chronic pain, GERD presenting with a chief complaint lightheadedness.  Patient states she recently went on a cruise ship and returned not feeling well.  She went to urgent care 2 days ago and had workup with lab work and chest x-ray.  She was diagnosed with bilateral middle ear infection and was placed on Augmentin.  Although patient states she [name] not sure why she was diagnosed with ear infection as she was not having any problems with her ears such as pain, discharge, or change in hearing.  States she had bilateral lower extremity edema and was told at urgent care that they saw some fluid in her lung as well for which she was prescribed Lasix.  Patient states urgent care called her back yesterday informing her that labs done during the visit the day prior came back showing low sodium and she was advised to come into the ED immediately.  Patient states she took only 1 dose of Lasix yesterday morning which was the day after labs were already drawn at urgent care.  She reports improvement of her bilateral lower extremity edema.  Also reports taking olmesartan for the past one month.  Patient denies seizures or any confusion but does report lightheadedness.  Denies vomiting or diarrhea.  Reports good p.o. intake.  No other complaints.  ED Course: Vital signs stable.  Orthostatics negative.  Labs showing no leukocytosis, sodium 127 (was 126 on labs done at urgent care 2 days ago but baseline appears to be 131-133), BNP normal, serum osmolarity 271, urine osmolarity 414, urine sodium not checked.  Chest x-ray showing no active disease.   Patient was given 1 L normal saline and repeat labs showing sodium 128.  Review of Systems:  ROS  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   DDD (degenerative disc disease), lumbar    Depression    Diabetes (HCC)    Diverticulosis    Drug use    Elevated LFTs    Fibromyalgia    GERD (gastroesophageal reflux disease)    History of ETOH abuse    Hyperlipidemia    Hyperplastic colon polyp    Internal hemorrhoids    Migraines    NAFLD (nonalcoholic fatty liver disease)    Obese    Obstructive sleep apnea 02/25/2015   not diagnosed per pt 04-03-20   Osteoarthritis    lower back- DDD   Periodic limb movement disorder 02/25/2014   Pre-diabetes    pt taking metformin   RLS (restless legs syndrome)    Sleep apnea    Smoker    Snoring    Substance abuse (HCC)    Swallowing difficulty    Vitamin B12 deficiency  Vitamin D deficiency     Past Surgical History:  Procedure Laterality Date   CERVICAL FUSION     COLONOSCOPY     CYSTOSCOPY  06/06/2013   Procedure: CYSTOSCOPY;  Surgeon: Ok Edwards, MD;  Location: [facility];  Service: Gynecology;;   EXCISION METACARPAL MASS Right 09/03/2021   Procedure: EXCISION METACARPAL MASS RIGHT SMALL FINGER;  Surgeon: Cindee Salt, MD;  Location: [facility];  Service: Orthopedics;  Laterality: Right;   INTRAUTERINE DEVICE INSERTION  08/09/2008   PARAGUARD- removed   kidney surgery  1995   abcess   LAPAROSCOPY N/A 06/06/2013   Procedure: LAPAROSCOPY DIAGNOSTIC;  Surgeon: Ok Edwards, MD;  Location: [facility];  Service: Gynecology;  Laterality: N/A;   LAPAROTOMY  06/06/2013   Procedure: EXPLORATORY  LAPAROTOMY;  Surgeon: Ok Edwards, MD;  Location: [facility];  Service: Gynecology;;   LIVER BIOPSY     radial tunnel Bilateral 2005   VAGINAL HYSTERECTOMY Left 06/06/2013   Procedure: Vaginal Hysterectomy with Patiial Right Salpingectomy;  Surgeon: Ok Edwards, MD;  Location: [facility];  Service: Gynecology;  Laterality: Left;     reports that she quit smoking about 11 years ago. Her smoking use included cigarettes. She has never used smokeless tobacco. She reports that she does not drink alcohol and does not use drugs.  Allergies  Allergen Reactions   Benadryl [Diphenhydramine Hcl] Hives   Ropinirole Other (See Comments)   Levofloxacin Other (See Comments)   Aspirin    Codeine    Doxycycline    Lisinopril-Hydrochlorothiazide Other (See Comments)    Dry cough, dry eyes, insomnia, emotionally liable, increased thirst, brain fog, fatigue.   Metformin And Related     Blurred vision   Nuvigil [Armodafinil]     migraines    Requip [Ropinirole Hcl]     Increased headache   Semaglutide Other (See Comments)    Rybelsus caused depression and fibromyalgia flair.   Tramadol     Other reaction(s): Other (See Comments)   Wellbutrin [Bupropion]    Zolpidem     Family History  Problem Relation Age of Onset   Cancer Mother    Breast cancer Mother    Depression Mother    Anxiety disorder Mother    Obesity Mother    Sleep apnea Mother    Obesity Father    Anxiety disorder Father    Cancer Father    Hyperlipidemia Father    Colon polyps Father    Diabetes Father    Cirrhosis Father    Liver cancer Father    Liver disease Father    Alcoholism Father    Depression Father    Alcohol abuse Father    Sleep apnea Father    Irritable bowel syndrome Sister    Sleep apnea Brother    Breast cancer Maternal Aunt    Alcoholism Maternal Aunt    Alcoholism Maternal Grandmother    Alcoholism Maternal Grandfather    Breast cancer Paternal Grandmother    Colon cancer Paternal Grandmother     Esophageal cancer Paternal Grandfather    Stomach cancer Paternal Grandfather    Alcoholism Paternal Grandfather    Rectal cancer Neg Hx     Prior to Admission medications   Medication Sig Start Date End Date Taking? Authorizing Provider  azelastine (ASTELIN) 0.1 % nasal spray Place 2 sprays into both nostrils 2 (two) times daily. 09/10/22   Ardith Dark, MD  B Complex Vitamins (B COMPLEX PO) Take 1  tablet by mouth daily.    [provider]  blood glucose meter kit and supplies KIT Dispense based on patient and insurance preference. Use up to four times daily as directed. (FOR ICD-9 250.00, 250.01). 04/11/19   Deeann Saint, MD  Blood Glucose Monitoring Suppl (ONETOUCH VERIO FLEX SYSTEM) w/Device KIT Use to check Blood sugars twice daily before meals 04/12/19   Deeann Saint, MD  Brexpiprazole (REXULTI) 4 MG TABS Take by mouth.    [provider]  Calcium Carbonate-Vitamin D 600-400 MG-UNIT tablet Take 1 tablet by mouth daily.    [provider]  clotrimazole-betamethasone (LOTRISONE) cream Apply 1 application topically 2 (two) times daily. For 10 days maximum 06/09/19   Shelva Majestic, MD  Coenzyme Q10 (COQ10) 100 MG CAPS Take 100 mg by mouth daily.    [provider]  diclofenac (VOLTAREN) 75 MG EC tablet Take 75 mg by mouth 2 (two) times daily. 10/21/21   [provider]  estradiol (VIVELLE-DOT) 0.05 MG/24HR patch Place 1 patch (0.05 mg total) onto the skin 2 (two) times a week. 11/11/22   Genia Del, MD  fluticasone (CUTIVATE) 0.05 % cream Apply topically 2 (two) times daily.    [provider]  glucose blood (CONTOUR NEXT TEST) test strip Use to test blood glucose twice daily 01/11/20   Deeann Saint, MD  hydrOXYzine (ATARAX) 10 MG tablet Take 20 mg by mouth daily as needed. 11/23/22   [provider]  ibuprofen (ADVIL) 800 MG tablet TAKE 1 TABLET(800 MG) BY MOUTH EVERY 8 HOURS AS NEEDED FOR MODERATE PAIN  03/25/21   Deeann Saint, MD  ketoconazole (NIZORAL) 2 % cream Apply 1 application topically daily.    [provider]  Lancets (ONETOUCH DELICA PLUS LANCET30G) MISC USE 1  TO CHECK GLUCOSE 4 TIMES DAILY 10/11/19   Deeann Saint, MD  loratadine (CLARITIN) 10 MG tablet Take 10 mg by mouth daily.    [provider]  LORazepam (ATIVAN) 0.5 MG tablet Take 0.5 mg by mouth daily. 10/22/21   [provider]  Magnesium 500 MG CAPS Take by mouth.    [provider]  metroNIDAZOLE (METROCREAM) 0.75 % cream daily 11/21/19   [provider]  Multiple Vitamins-Minerals (MULTIVITAMIN PO) Take 1 tablet by mouth daily.     [provider]  olmesartan (BENICAR) 20 MG tablet Take 0.5 tablets (10 mg total) by mouth daily. Take half of a 20 mg tab (10 mg) daily 05/11/23   Deeann Saint, MD  OMEGA 3-6-9 FATTY ACIDS PO Take 500 mg by mouth daily.    [provider]  oxcarbazepine (TRILEPTAL) 600 MG tablet Take 600 mg by mouth 2 (two) times daily. 10/22/21   [provider]  pantoprazole (PROTONIX) 40 MG tablet TAKE 1 TABLET BY MOUTH DAILY Patient taking differently: as needed. 01/12/21   Deeann Saint, MD  rizatriptan (MAXALT) 10 MG tablet TAKE 1 TABLET BY MOUTH AS NEEDED FOR MIGRAINE 03/03/23   Deeann Saint, MD  thiamine (VITAMIN B-1) 50 MG tablet Take 50 mg by mouth daily.    [provider]  tirzepatide Greggory Keen) 15 MG/0.5ML Pen Inject 15 mg into the skin once a week. 06/21/23   Quillian Quince D, MD  vitamin C (ASCORBIC ACID) 500 MG tablet Take 500 mg by mouth daily.    [provider]  VITAMIN E PO Take 900 mg by mouth daily.    [provider]  Physical Exam: Vitals:   07/09/23 1930 07/09/23 2345 07/10/23 0000 07/10/23 0144  BP: (!) 154/62 (!) 145/71 (!) 152/73 135/72  Pulse: 82 81 79 83  Resp: 18 14 14    Temp: 98 F (36.7 C)   98.3 F (36.8 C)  TempSrc: Oral     SpO2: 100% 98% 96% 95%  Weight:       Height:        Physical Exam  Labs on Admission: I have personally reviewed following labs and imaging studies  CBC: Recent Labs  Lab 07/09/23 1418  WBC 8.2  NEUTROABS 6.3  HGB 13.9  HCT 40.7  MCV 86.0  PLT 257   Basic Metabolic Panel: Recent Labs  Lab 07/09/23 1418 07/09/23 1726  NA 127* 128*  K 4.0 4.1  CL 95* 96*  CO2 21* 25  GLUCOSE 86 83  BUN 7 6  CREATININE 0.54 0.55  CALCIUM 9.0 8.5*   GFR: Estimated Creatinine Clearance: 96.7 mL/min (by C-G formula based on SCr of 0.55 mg/dL). Liver Function Tests: Recent Labs  Lab 07/09/23 1418  AST 17  ALT 17  ALKPHOS 55  BILITOT 0.4  PROT 7.6  ALBUMIN 4.3   No results for input(s): "LIPASE", "AMYLASE" in the last 168 hours. No results for input(s): "AMMONIA" in the last 168 hours. Coagulation Profile: No results for input(s): "INR", "PROTIME" in the last 168 hours. Cardiac Enzymes: No results for input(s): "CKTOTAL", "CKMB", "CKMBINDEX", "TROPONINI" in the last 168 hours. BNP (last 3 results) No results for input(s): "PROBNP" in the last 8760 hours. HbA1C: No results for input(s): "HGBA1C" in the last 72 hours. CBG: Recent Labs  Lab 07/10/23 0102  GLUCAP 92   Lipid Profile: No results for input(s): "CHOL", "HDL", "LDLCALC", "TRIG", "CHOLHDL", "LDLDIRECT" in the last 72 hours. Thyroid Function Tests: No results for input(s): "TSH", "T4TOTAL", "FREET4", "T3FREE", "THYROIDAB" in the last 72 hours. Anemia Panel: No results for input(s): "VITAMINB12", "FOLATE", "FERRITIN", "TIBC", "IRON", "RETICCTPCT" in the last 72 hours. Urine analysis:    Component Value Date/Time   COLORURINE YELLOW 07/09/2023 1459   APPEARANCEUR CLEAR 07/09/2023 1459   LABSPEC 1.014 07/09/2023 1459   PHURINE 7.5 07/09/2023 1459   GLUCOSEU NEGATIVE 07/09/2023 1459   HGBUR NEGATIVE 07/09/2023 1459   BILIRUBINUR NEGATIVE 07/09/2023 1459   KETONESUR NEGATIVE 07/09/2023 1459   PROTEINUR NEGATIVE 07/09/2023 1459   UROBILINOGEN  0.2 08/27/2014 1605   NITRITE NEGATIVE 07/09/2023 1459   LEUKOCYTESUR NEGATIVE 07/09/2023 1459    Radiological Exams on Admission: DG Chest Portable 1 View  Result Date: 07/09/2023 CLINICAL DATA:  Cough when she lays flat.  Feels okay otherwise. EXAM: PORTABLE CHEST 1 VIEW COMPARISON:  12/31/2007 FINDINGS: Chronic blunting of the right costophrenic angle likely secondary to scarring. No focal consolidation. No pleural effusion or pneumothorax. Heart and mediastinal contours are unremarkable. No acute osseous abnormality. IMPRESSION: No active disease. Electronically Signed   By: Elige Ko M.D.   On: 07/09/2023 15:03    EKG: Pending at this time.  Assessment and Plan  Hyponatremia Patient has been taking olmesartan for the past 1 month.  No vomiting or diarrhea.  Reports good p.o. intake and was recently on vacation.  Labs drawn at urgent care 2 days ago showing sodium 126 and baseline appears to be 131-133.  Do not think Lasix use [name] contributing as she only took 1 dose yesterday and this was after labs were already drawn at urgent care the day prior.  She then presented to [facility]  ED yesterday and repeat labs showing sodium 127.  She was given 1 L normal saline and repeat sodium 128.  Serum osmolarity 271, urine osmolarity 414. Continue gentle IV fluid hydration with normal saline and monitor sodium level every 6 hours.  She currently has no lower extremity edema and no signs of volume overload on imaging.  Check urine sodium level for further workup.  Hold olmesartan and Lasix.  Type 2 diabetes A1c 5.6 a year ago, will repeat.  Sensitive sliding scale insulin ACHS.  Hypertension Currently normotensive.  Hold olmesartan and Lasix in the setting of hyponatremia.  OSA Continue nightly CPAP.  ?Ear infection She was diagnosed with bilateral middle ear infection during urgent care visit 2 days ago and was placed on Augmentin.  Although patient states she [name] not sure why she was diagnosed  with ear infection as she was not having any problems with her ears such as pain, discharge, or change in hearing.  ED provider at [facility] ED did otoscopic exam and felt that tympanic membranes were clear bilaterally.  No fever or leukocytosis.  Hyperlipidemia Anxiety/depression Chronic pain GERD Pharmacy med rec pending.  DVT prophylaxis: Lovenox Code Status: Full Code (discussed with the patient) Family Communication: No family available at this time. Level of care: Telemetry bed Admission status: It [name] my clinical opinion that referral for OBSERVATION [name] reasonable and necessary in this patient based on the above information provided. The aforementioned taken together are felt to place the patient at high risk for further clinical deterioration. However, it [name] anticipated that the patient may be medically stable for discharge from the hospital within 24 to 48 hours.  John Giovanni MD [facility]  If 7PM-7AM, please contact night-coverage [email or web address]  07/10/2023, 3:01 AM

## 2023-07-10 NOTE — ED Provider Notes (Signed)
[facility] emergency department Provider Note   CSN: [id number] Arrival date & time: 07/09/23  1241     History Chief Complaint  Patient presents with   Hyponatremic    Gloria [name] is a 52 y.o. female with h/o fibromyalgia, type 2 diabetes, hypertension presents emerged from today for evaluation for abnormal lab results.  The patient reports that she recently finished a 2-week cruise in the [place] of [place].  She flew in yesterday and reports that she overall was not feeling well.  She went to urgent care and had a workup with lab work and chest x-ray.  She was diagnosed with a bilateral middle ear infection, without having any ear pain, and was placed on Augmentin.  They report that they saw some fluid in her right lung as well.  They also gave her some Lasix as she was having some lower extremity bilateral leg swelling over the past 3 to 4 days.  They gave her 30 pills of 20 mg of Lasix and told her to take this daily and follow-up with her primary care doctor.  She reports that her leg swelling greatly improved and now feels that her legs are back to normal this morning.  She was concerned because the urgent care called her and said that her sodium was low and that she needed to come into the emergency department.  She denies any seizures or any confusion but reports that she is been feeling some lightheaded/wooziness.  She denies any chest pain or any shortness of breath.  Denies any abdominal pain, nausea, vomiting, diarrhea, constipation.  Denies any urinary symptoms.  Denies any orthopnea.  She reports that she had a cough with laying down which is why they thought that maybe she had pneumonia given her chest x-ray.  She is unsure of any orthopnea given that she uses a CPAP.  She reports that while she was on the cruise she was drinking 1 liquid IV a day.  She was recently started on olmesartan at the beginning of the month.  She is a previous alcohol user however has not had any  alcohol since 2009.  Denies any tobacco or illicit drug use.  HPI     Home Medications Prior to Admission medications   Medication Sig Start Date End Date Taking? Authorizing Provider  azelastine (ASTELIN) 0.1 % nasal spray Place 2 sprays into both nostrils 2 (two) times daily. 09/10/22   Ardith Dark, MD  B Complex Vitamins (B COMPLEX PO) Take 1 tablet by mouth daily.    [provider]  blood glucose meter kit and supplies KIT Dispense based on patient and insurance preference. Use up to four times daily as directed. (FOR ICD-9 250.00, 250.01). 04/11/19   Deeann Saint, MD  Blood Glucose Monitoring Suppl (ONETOUCH VERIO FLEX SYSTEM) w/Device KIT Use to check Blood sugars twice daily before meals 04/12/19   Deeann Saint, MD  Brexpiprazole (REXULTI) 4 MG TABS Take by mouth.    [provider]  Calcium Carbonate-Vitamin D 600-400 MG-UNIT tablet Take 1 tablet by mouth daily.    [provider]  clotrimazole-betamethasone (LOTRISONE) cream Apply 1 application topically 2 (two) times daily. For 10 days maximum 06/09/19   Shelva Majestic, MD  Coenzyme Q10 (COQ10) 100 MG CAPS Take 100 mg by mouth daily.    [provider]  diclofenac (VOLTAREN) 75 MG EC tablet Take 75 mg by mouth 2 (two) times daily. 10/21/21   [provider]  estradiol (VIVELLE-DOT) 0.05 MG/24HR patch Place 1 patch (0.05 mg total) onto the skin 2 (two) times a week. 11/11/22   Genia Del, MD  fluticasone (CUTIVATE) 0.05 % cream Apply topically 2 (two) times daily.    [provider]  glucose blood (CONTOUR NEXT TEST) test strip Use to test blood glucose twice daily 01/11/20   Deeann Saint, MD  hydrOXYzine (ATARAX) 10 MG tablet Take 20 mg by mouth daily as needed. 11/23/22   [provider]  ibuprofen (ADVIL) 800 MG tablet TAKE 1 TABLET(800 MG) BY MOUTH EVERY 8 HOURS AS NEEDED FOR MODERATE PAIN 03/25/21   Deeann Saint, MD  ketoconazole (NIZORAL) 2 %  cream Apply 1 application topically daily.    [provider]  Lancets (ONETOUCH DELICA PLUS LANCET30G) MISC USE 1  TO CHECK GLUCOSE 4 TIMES DAILY 10/11/19   Deeann Saint, MD  loratadine (CLARITIN) 10 MG tablet Take 10 mg by mouth daily.    [provider]  LORazepam (ATIVAN) 0.5 MG tablet Take 0.5 mg by mouth daily. 10/22/21   [provider]  Magnesium 500 MG CAPS Take by mouth.    [provider]  metroNIDAZOLE (METROCREAM) 0.75 % cream daily 11/21/19   [provider]  Multiple Vitamins-Minerals (MULTIVITAMIN PO) Take 1 tablet by mouth daily.     [provider]  olmesartan (BENICAR) 20 MG tablet Take 0.5 tablets (10 mg total) by mouth daily. Take half of a 20 mg tab (10 mg) daily 05/11/23   Deeann Saint, MD  OMEGA 3-6-9 FATTY ACIDS PO Take 500 mg by mouth daily.    [provider]  oxcarbazepine (TRILEPTAL) 600 MG tablet Take 600 mg by mouth 2 (two) times daily. 10/22/21   [provider]  pantoprazole (PROTONIX) 40 MG tablet TAKE 1 TABLET BY MOUTH DAILY Patient taking differently: as needed. 01/12/21   Deeann Saint, MD  rizatriptan (MAXALT) 10 MG tablet TAKE 1 TABLET BY MOUTH AS NEEDED FOR MIGRAINE 03/03/23   Deeann Saint, MD  thiamine (VITAMIN B-1) 50 MG tablet Take 50 mg by mouth daily.    [provider]  tirzepatide Greggory Keen) 15 MG/0.5ML Pen Inject 15 mg into the skin once a week. 06/21/23   Quillian Quince D, MD  vitamin C (ASCORBIC ACID) 500 MG tablet Take 500 mg by mouth daily.    [provider]  VITAMIN E PO Take 900 mg by mouth daily.    [provider]      Allergies    Benadryl [diphenhydramine hcl], Ropinirole, Levofloxacin, Aspirin, Codeine, Doxycycline, Lisinopril-hydrochlorothiazide, Metformin and related, Nuvigil [armodafinil], Requip [ropinirole hcl], Semaglutide, Tramadol, Wellbutrin [bupropion], and Zolpidem    Review of Systems   Review of Systems   Constitutional:  Negative for chills and fever.  HENT:  Negative for ear pain and sore throat.   Respiratory:  Negative for shortness of breath.   Cardiovascular:  Positive for leg swelling. Negative for chest pain.  Gastrointestinal:  Negative for abdominal pain, constipation, diarrhea, nausea and vomiting.  Genitourinary:  Negative for dysuria and hematuria.  Neurological:  Positive for light-headedness. Negative for dizziness, seizures, syncope, facial asymmetry, speech difficulty, weakness and headaches.    Physical Exam Updated Vital Signs BP 135/72 (BP Location: Right Arm)   Pulse 83   Temp 98.3 F (36.8 C)   Resp 14   Ht 5\' 4"  (1.626 m)   Wt 106.1 kg   LMP 04/25/2013   SpO2 95%  BMI 40.17 kg/m  Physical Exam Vitals and nursing note reviewed.  Constitutional:      General: She is not in acute distress.    Appearance: She is not ill-appearing or toxic-appearing.  HENT:     Right Ear: Tympanic membrane, ear canal and external ear normal.     Left Ear: Tympanic membrane, ear canal and external ear normal.     Ears:     Comments: TMs are clear bilaterally.    Mouth/Throat:     Mouth: Mucous membranes are dry.  Eyes:     General: No scleral icterus. Cardiovascular:     Rate and Rhythm: Normal rate.  Pulmonary:     Effort: Pulmonary effort is normal. No respiratory distress.     Breath sounds: Normal breath sounds.  Abdominal:     Tenderness: There is no abdominal tenderness. There is no guarding or rebound.  Musculoskeletal:     Right lower leg: No edema.     Left lower leg: No edema.  Skin:    General: Skin is warm and dry.  Neurological:     General: No focal deficit present.     Mental Status: She is alert. Mental status is at baseline.     Cranial Nerves: No cranial nerve deficit.     Motor: No weakness.     ED Results / Procedures / Treatments   Labs (all labs ordered are listed, but only abnormal results are displayed) Labs Reviewed  COMPREHENSIVE  METABOLIC PANEL - Abnormal; Notable for the following components:      Result Value   Sodium 127 (*)    Chloride 95 (*)    CO2 21 (*)    All other components within normal limits  OSMOLALITY - Abnormal; Notable for the following components:   Osmolality 271 (*)    All other components within normal limits  BASIC METABOLIC PANEL - Abnormal; Notable for the following components:   Sodium 128 (*)    Chloride 96 (*)    Calcium 8.5 (*)    All other components within normal limits  OSMOLALITY, URINE  URINALYSIS, ROUTINE W REFLEX MICROSCOPIC  CBC WITH DIFFERENTIAL/PLATELET  BRAIN NATRIURETIC PEPTIDE  CBG MONITORING, ED    EKG None  Radiology DG Chest Portable 1 View  Result Date: 07/09/2023 CLINICAL DATA:  Cough when she lays flat.  Feels okay otherwise. EXAM: PORTABLE CHEST 1 VIEW COMPARISON:  12/31/2007 FINDINGS: Chronic blunting of the right costophrenic angle likely secondary to scarring. No focal consolidation. No pleural effusion or pneumothorax. Heart and mediastinal contours are unremarkable. No acute osseous abnormality. IMPRESSION: No active disease. Electronically Signed   By: Elige Ko M.D.   On: 07/09/2023 15:03    Procedures .Critical Care  Performed by: Achille Rich, PA-C Authorized by: Achille Rich, PA-C   Critical care provider statement:    Critical care time (minutes):  30   Critical care was necessary to treat or prevent imminent or life-threatening deterioration of the following conditions: hyponatremia.   Critical care was time spent personally by me on the following activities:  Development of treatment plan with patient or surrogate, discussions with consultants, evaluation of patient's response to treatment, examination of patient, ordering and review of laboratory studies, ordering and review of radiographic studies, ordering and performing treatments and interventions, pulse oximetry, re-evaluation of patient's condition and review of old charts   Care  discussed with: admitting provider    Medications Ordered in ED Medications  sodium chloride 0.9 % bolus 1,000  mL (0 mLs Intravenous Stopped 07/09/23 1619)  acetaminophen (TYLENOL) tablet 1,000 mg (1,000 mg Oral Given 07/10/23 0055)    ED Course/ Medical Decision Making/ A&P   Medical Decision Making Amount and/or Complexity of Data Reviewed Labs: ordered. Radiology: ordered.  Risk OTC drugs. Decision regarding hospitalization.   52 y.o. female presents to the ER for evaluation of abnormal labs. Differential diagnosis includes but is not limited to hyponatremia, lab abnormality, GI loss, medication side effect. Vital signs unremarkable. Physical exam as noted above.   Fluid bolus ordered.  On previous chart evaluation, patient had sodium of 126 yesterday.  Chloride 74 with glucose of 66.  Creatinine is 0.50.  No other electrolyte or LFT abnormality at that time.  I independently reviewed and interpreted the patient's labs.  Urinalysis remarkable.  CBC shows no leukocytosis or anemia.  CMP shows sodium at 127 with chloride of 95 and a bicarb of 21 however normal anion gap.  No other electrolyte or LFT normality.  BNP within normal limits.  Urine osmolality of 414 whereas serum osmolality is at 271.  BMP repeat at 128 with sodium and chloride at 96.  CXR shows chronic blunting of the right costophrenic angle likely secondary to scarring. No focal consolidation. No pleural effusion or pneumothorax. Heart and mediastinal contours are unremarkable. No acute osseous abnormality. Per radiology read.   Patient may be experiencing some hyponatremia from her new olmesartan.  She has not had any nausea, vomiting, or diarrhea.  I do not appreciate any otitis media seen bilaterally.  Asked that she stop taking the medication.  Discussed with her do not take any more Lasix as well.  I initially consulted hospitalist reports that she can likely go home given that her sodium has improved some and to be  discharged with salt tablets.  I discussed this with patient and she would like to be admitted versus going home because she is a difficult stick and thinks that her primary care doctor may have difficulty obtaining labs when she sees her in Wednesday.  I think this is reasonable for observation.  I reconsulted out the hospitalist team who will admit her for observation.  No other recommendations at this time.  The patient is alert and oriented and does not appear confused.  She has not had any seizure-like activity or history of seizures for over the past few days.  She appears in no acute distress and has been reading her book comfortably.  Patient amenable to admission.  [facility] hospitalist to admit.  Portions of this report may have been transcribed using voice recognition software. Every effort was made to ensure accuracy; however, inadvertent computerized transcription errors may be present.   Final Clinical Impression(s) / ED Diagnoses Final diagnoses:  Hyponatremia    Rx / DC Orders ED Discharge Orders     None         Achille Rich, PA-C 07/10/23 0224    Franne Forts, DO 07/17/23 1401

## 2023-07-10 NOTE — ED Notes (Signed)
ED TO INPATIENT HANDOFF REPORT  ED Nurse Name and Phone #: Levander Campion Name/Age/Gender Gloria [name] 52 y.o. female Room/Bed: DB005/DB005  Code Status   Code Status: Not on file  Home/SNF/Other Home Patient oriented to: self, place, time, and situation Is this baseline? Yes   Triage Complete: Triage complete  Chief Complaint Hyponatremia [E87.1]  Triage Note Pt went to UC yesterday, wasn't feeling well after cruise. Had double ear infection, fluid in R lung from XR, BLE swelling. Given ABX and Lasix and Tessalon Perles for cough. Na 125-resulted today and sent here.   Pt states she doesn't feel bad today, just lightheaded.    Allergies Allergies  Allergen Reactions   Benadryl [Diphenhydramine Hcl] Hives   Ropinirole Other (See Comments)   Levofloxacin Other (See Comments)   Aspirin    Codeine    Doxycycline    Lisinopril-Hydrochlorothiazide Other (See Comments)    Dry cough, dry eyes, insomnia, emotionally liable, increased thirst, brain fog, fatigue.   Metformin And Related     Blurred vision   Nuvigil [Armodafinil]     migraines    Requip [Ropinirole Hcl]     Increased headache   Semaglutide Other (See Comments)    Rybelsus caused depression and fibromyalgia flair.   Tramadol     Other reaction(s): Other (See Comments)   Wellbutrin [Bupropion]    Zolpidem     Level of Care/Admitting Diagnosis ED Disposition     ED Disposition  Admit   Condition  --   Comment  Hospital Area: [facility] [[id number]]  Level of Care: Telemetry Medical [104]  Interfacility transfer: Yes  May place patient in observation at [facility] or [facility] if equivalent level of care is available:: Yes  Covid Evaluation: Asymptomatic - no recent exposure (last 10 days) testing not required  Diagnosis: Hyponatremia [198519]  Admitting Physician: Tereasa Coop [[id number]]  Attending Physician: Tereasa Coop [[id number]]          B Medical/Surgery  History 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   DDD (degenerative disc disease), lumbar    Depression    Diabetes (HCC)    Diverticulosis    Drug use    Elevated LFTs    Fibromyalgia    GERD (gastroesophageal reflux disease)    History of ETOH abuse    Hyperlipidemia    Hyperplastic colon polyp    Internal hemorrhoids    Migraines    NAFLD (nonalcoholic fatty liver disease)    Obese    Obstructive sleep apnea 02/25/2015   not diagnosed per pt 04-03-20   Osteoarthritis    lower back- DDD   Periodic limb movement disorder 02/25/2014   Pre-diabetes    pt taking metformin   RLS (restless legs syndrome)    Sleep apnea    Smoker    Snoring    Substance abuse (HCC)    Swallowing difficulty    Vitamin B12 deficiency    Vitamin D deficiency    Past Surgical History:  Procedure Laterality  Date   CERVICAL FUSION     COLONOSCOPY     CYSTOSCOPY  06/06/2013   Procedure: CYSTOSCOPY;  Surgeon: Ok Edwards, MD;  Location: [facility];  Service: Gynecology;;   EXCISION METACARPAL MASS Right 09/03/2021   Procedure: EXCISION METACARPAL MASS RIGHT SMALL FINGER;  Surgeon: Cindee Salt, MD;  Location: [facility];  Service: Orthopedics;  Laterality: Right;   INTRAUTERINE DEVICE INSERTION  08/09/2008   PARAGUARD- removed   kidney surgery  1995   abcess   LAPAROSCOPY N/A 06/06/2013   Procedure: LAPAROSCOPY DIAGNOSTIC;  Surgeon: Ok Edwards, MD;  Location: [facility];  Service: Gynecology;  Laterality: N/A;   LAPAROTOMY  06/06/2013   Procedure: EXPLORATORY LAPAROTOMY;  Surgeon: Ok Edwards, MD;  Location: [facility];  Service: Gynecology;;   LIVER BIOPSY      radial tunnel Bilateral 2005   VAGINAL HYSTERECTOMY Left 06/06/2013   Procedure: Vaginal Hysterectomy with Patiial Right Salpingectomy;  Surgeon: Ok Edwards, MD;  Location: [facility];  Service: Gynecology;  Laterality: Left;     A IV Location/Drains/Wounds Patient Lines/Drains/Airways Status     Active Line/Drains/Airways     Name Placement date Placement time Site Days   Peripheral IV 07/09/23 20 G 1.88" Anterior;Left;Proximal Forearm 07/09/23  1448  Forearm  1            Intake/Output Last 24 hours  Intake/Output Summary (Last 24 hours) at 07/10/2023 0013 Last data filed at 07/09/2023 1619 Gross per 24 hour  Intake 1000 ml  Output --  Net 1000 ml    Labs/Imaging Results for orders placed or performed during the hospital encounter of 07/09/23 (from the past 48 hour(s))  CBC with Differential     Status: None   Collection Time: 07/09/23  2:18 PM  Result Value Ref Range   WBC 8.2 4.0 - 10.5 K/uL   RBC 4.73 3.87 - 5.11 MIL/uL   Hemoglobin 13.9 12.0 - 15.0 g/dL   HCT 18.2 99.3 - 71.6 %   MCV 86.0 80.0 - 100.0 fL   MCH 29.4 26.0 - 34.0 pg   MCHC 34.2 30.0 - 36.0 g/dL   RDW 96.7 89.3 - 81.0 %   Platelets 257 150 - 400 K/uL   nRBC 0.0 0.0 - 0.2 %   Neutrophils Relative % 76 %   Neutro Abs 6.3 1.7 - 7.7 K/uL   Lymphocytes Relative 13 %   Lymphs Abs 1.0 0.7 - 4.0 K/uL   Monocytes Relative 9 %   Monocytes Absolute 0.7 0.1 - 1.0 K/uL   Eosinophils Relative 1 %   Eosinophils Absolute 0.1 0.0 - 0.5 K/uL   Basophils Relative 1 %   Basophils Absolute 0.1 0.0 - 0.1 K/uL   Immature Granulocytes 0 %   Abs Immature Granulocytes 0.03 0.00 - 0.07 K/uL    Comment: Performed at [facility], [address] [place], [place] [address]  Comprehensive metabolic panel     Status: Abnormal   Collection Time: 07/09/23  2:18 PM  Result Value Ref Range   Sodium 127 (L) 135 - 145 mmol/L   Potassium 4.0 3.5 - 5.1 mmol/L   Chloride 95 (L) 98 - 111 mmol/L   CO2 21  (L) 22 - 32 mmol/L   Glucose, Bld 86 70 - 99 mg/dL    Comment: Glucose reference range applies only to samples taken after fasting for at least 8 hours.   BUN 7 6 - 20 mg/dL   Creatinine, Ser  0.54 0.44 - 1.00 mg/dL   Calcium 9.0 8.9 - 16.1 mg/dL   Total Protein 7.6 6.5 - 8.1 g/dL   Albumin 4.3 3.5 - 5.0 g/dL   AST 17 15 - 41 U/L   ALT 17 0 - 44 U/L   Alkaline Phosphatase 55 38 - 126 U/L   Total Bilirubin 0.4 0.3 - 1.2 mg/dL   GFR, Estimated >09 >60 mL/min    Comment: (NOTE) Calculated using the CKD-EPI Creatinine Equation (2021)    Anion gap 11 5 - 15    Comment: Performed at [facility], [address] [place], [place] [address]  Brain natriuretic peptide     Status: None   Collection Time: 07/09/23  2:18 PM  Result Value Ref Range   B Natriuretic Peptide 26.1 0.0 - 100.0 pg/mL    Comment: Performed at [facility], [address], [place], [place] [address]  Osmolality     Status: Abnormal   Collection Time: 07/09/23  2:44 PM  Result Value Ref Range   Osmolality 271 (L) 275 - 295 mOsm/kg    Comment: Performed at [facility], [address], [place], [place] [address]  Osmolality, urine     Status: None   Collection Time: 07/09/23  2:59 PM  Result Value Ref Range   Osmolality, Ur 414 300 - 900 mOsm/kg    Comment: Performed at [facility], [address] [place], [place] [address]  Urinalysis, Routine w reflex microscopic -Urine, Clean Catch     Status: None   Collection Time: 07/09/23  2:59 PM  Result Value Ref Range   Color, Urine YELLOW YELLOW   APPearance CLEAR CLEAR   Specific Gravity, Urine 1.014 1.005 - 1.030   pH 7.5 5.0 - 8.0   Glucose, UA NEGATIVE NEGATIVE mg/dL   Hgb urine dipstick NEGATIVE NEGATIVE   Bilirubin Urine NEGATIVE NEGATIVE   Ketones, ur NEGATIVE NEGATIVE mg/dL   Protein, ur NEGATIVE NEGATIVE mg/dL   Nitrite NEGATIVE NEGATIVE   Leukocytes,Ua NEGATIVE NEGATIVE    Comment: Performed at [facility], [address] [place], [place] [address]  Basic metabolic panel     Status: Abnormal   Collection Time: 07/09/23  5:26 PM  Result Value Ref Range   Sodium 128 (L) 135 - 145 mmol/L   Potassium 4.1 3.5 - 5.1 mmol/L   Chloride 96 (L) 98 - 111 mmol/L   CO2 25 22 - 32 mmol/L   Glucose, Bld 83 70 - 99 mg/dL    Comment: Glucose reference range applies only to samples taken after fasting for at least 8 hours.   BUN 6 6 - 20 mg/dL   Creatinine, Ser 8.46 0.44 - 1.00 mg/dL   Calcium 8.5 (L) 8.9 - 10.3 mg/dL   GFR, Estimated >96 >29 mL/min    Comment: (NOTE) Calculated using the CKD-EPI Creatinine Equation (2021)    Anion gap 7 5 - 15    Comment: Performed at [facility], [address] [place], [place] [address]   DG Chest Portable 1 View  Result Date: 07/09/2023 CLINICAL DATA:  Cough when she lays flat.  Feels okay otherwise. EXAM: PORTABLE CHEST 1 VIEW COMPARISON:  12/31/2007 FINDINGS: Chronic blunting of the right costophrenic angle likely secondary to scarring. No focal consolidation. No pleural effusion or pneumothorax. Heart and mediastinal contours are unremarkable. No acute osseous abnormality. IMPRESSION: No active disease. Electronically Signed   By: Elige Ko M.D.   On:  07/09/2023 15:03    Pending Labs Unresulted Labs (From admission, onward)    None       Vitals/Pain Today's Vitals   07/09/23 1919 07/09/23 1930 07/09/23 2345 07/10/23 0000  BP:  (!) 154/62 (!) 145/71 (!) 152/73  Pulse:  82 81 79  Resp:  18 14 14   Temp:  98 F (36.7 C)    TempSrc:  Oral    SpO2: 98% 100% 98% 96%  Weight:      Height:      PainSc:        Isolation Precautions No active isolations  Medications Medications  sodium chloride 0.9 % bolus 1,000 mL (0 mLs Intravenous Stopped 07/09/23 1619)    Mobility walks     Focused Assessments Neuro Assessment Handoff:           Neuro Assessment: Within Defined Limits Neuro Checks:      R Recommendations: See Admitting Provider Note  Report given to:   Additional Notes: A&Ox4, ambulates to the restroom

## 2023-07-10 NOTE — Progress Notes (Signed)
New Admission Note:  Arrival Method: By [facility] around 0140 Mental Orientation: Alert and oriented x 4 Telemetry: Box 6, CCMD notified Assessment: Completed Skin: Completed, refer to flowsheets IV: Left forearm S.L.  Pain: Denies Tubes: None Safety Measures: Safety Fall Prevention Plan was given, discussed and signed. Admission: Completed [facility] Orientation: Patient has been orientated to the room, unit and the staff. Family: None  Orders have been reviewed and implemented. Will continue to monitor the patient. Call light has been placed within reach and bed alarm has been activated.   Franky Macho, RN  Phone Number: [phone]

## 2023-07-10 NOTE — Progress Notes (Signed)
Pt refused labs and CPAP machine.   Devonne Doughty Jevan Gaunt

## 2023-07-11 ENCOUNTER — Encounter: Payer: Self-pay | Admitting: Family Medicine

## 2023-07-11 ENCOUNTER — Other Ambulatory Visit: Payer: Self-pay | Admitting: Family Medicine

## 2023-07-11 ENCOUNTER — Ambulatory Visit (INDEPENDENT_AMBULATORY_CARE_PROVIDER_SITE_OTHER): Payer: 59 | Admitting: Family Medicine

## 2023-07-11 ENCOUNTER — Telehealth: Payer: Self-pay

## 2023-07-11 VITALS — BP 116/78 | HR 83 | Temp 99.4°F | Wt 231.6 lb

## 2023-07-11 DIAGNOSIS — W57XXXA Bitten or stung by nonvenomous insect and other nonvenomous arthropods, initial encounter: Secondary | ICD-10-CM

## 2023-07-11 DIAGNOSIS — E871 Hypo-osmolality and hyponatremia: Secondary | ICD-10-CM

## 2023-07-11 DIAGNOSIS — I10 Essential (primary) hypertension: Secondary | ICD-10-CM | POA: Diagnosis not present

## 2023-07-11 DIAGNOSIS — S90562A Insect bite (nonvenomous), left ankle, initial encounter: Secondary | ICD-10-CM

## 2023-07-11 DIAGNOSIS — R600 Localized edema: Secondary | ICD-10-CM

## 2023-07-11 MED ORDER — CARVEDILOL 6.25 MG PO TABS: 6.2500 mg | ORAL_TABLET | Freq: Two times a day (BID) | ORAL | 1 refills | Status: DC

## 2023-07-11 NOTE — Patient Instructions (Signed)
A Prescription for coreg 6.25 mg twice a day was sent to you pharmacy.  You can try taking this and monitor hide blood pressure is going.  We will plan to recheck your sodium later this week.  Stop taking olmesartan.

## 2023-07-11 NOTE — Progress Notes (Signed)
Established Patient Office Visit   Subjective  Patient ID: Gloria [name], female    DOB: [birth year]  Age: 52 y.o. MRN: [id number]  Chief Complaint  Patient presents with   Follow-up    ED follow up, hyponatremia. Does not feel herself, feels off. Stated she got back from cruise on Friday and took off patch and still felt the motion until this morning. Swollen lower legs is why she went to UC, then was sent to ED after lab work results.     Patient is a 52 year old female seen for HFU.  Patient hospitalized 8/17-8/18 for hyponatremia.  Patient initially went to UC 2 days prior to hospitalization for bilateral LE edema after returning from a cruise.  In ED patient was told she had bilateral AOM and a small amount of fluid on lungs.  She was given Augmentin, Lasix, Tessalon.  Patient was later contacted by UC and advised to go to the ED due to hyponatremia, sodium 126.  In ED sodium 127 (baseline 131-133), BNP normal, serum osm 271 and urine osm 414, urine sodium not checked.  CXR was negative.  Sodium increased to 128 after 1 L NS.  Sodium 129 at time of discharge.  Patient advised on close follow-up with PCP.   Patient states today is the first day that the dizziness has resolved from when she was on her cruise a few weeks ago.  Patient notes that she "still does not feel quite right".  Unsure if she is tired from recent trip and hospitalization versus other cause.  Pt has been stable on BH meds for years.  The only recent change was BP med.  Started olmesartan in June.  Since being on medication patient's BP has been great, but open to switching medication as it may be contributing to hyponatremia.  Patient actually has an appointment in 2 days for CPE.  Inquires if labs can be drawn then as her arms are sore from recent blood draws as was just discharged from hospital yesterday.  Patient denies nausea, vomiting, fever, chills.    Patient Active Problem List   Diagnosis Date Noted    Hyponatremia 07/09/2023   BMI 39.0-39.9,adult 05/24/2023   Other osteoarthritis of spine 01/04/2023   BMI 40.0-44.9, adult (HCC) 01/04/2023   Obesity, Beginning BMI 45.60 01/04/2023   Low back pain 10/07/2022   Pre-hypertension 08/05/2022   Mixed hyperlipidemia 07/14/2022   Stress 06/09/2022   Essential hypertension 05/03/2022   At risk for heart disease 05/03/2022   Vitamin D deficiency 04/07/2022   At risk for hypoglycemia 04/07/2022   Non-restorative sleep 10/25/2020   Gasping for breath 09/23/2020   Class 3 severe obesity with serious comorbidity and body mass index (BMI) of 45.0 to 49.9 in adult [facility]) 09/23/2020   Hypersomnia, recurrent 09/23/2020   MDD (recurrent major depressive disorder) in remission (HCC) 09/23/2020   Diabetes mellitus (HCC) 04/06/2019   Chronic back pain 02/25/2015   Periodic limb movement disorder 02/25/2014   Restless legs syndrome 12/15/2013   Cardiac murmur 10/16/2013   Snoring 10/16/2013   Hx of migraine headaches 10/16/2013   Fluid retention 10/12/2013   Weight gain 10/12/2013   Anxiety 10/12/2013   Alcoholic liver disease (HCC) 10/28/2012   Ex-cigarette smoker 10/28/2012   Fibromyalgia 10/27/2012   Depression with anxiety 10/27/2012   GERD (gastroesophageal reflux disease) 10/27/2012   H/O ETOH abuse 10/27/2012   Past Surgical History:  Procedure Laterality Date   CERVICAL FUSION     COLONOSCOPY  CYSTOSCOPY  06/06/2013   Procedure: CYSTOSCOPY;  Surgeon: Ok Edwards, MD;  Location: [facility];  Service: Gynecology;;   EXCISION METACARPAL MASS Right 09/03/2021   Procedure: EXCISION METACARPAL MASS RIGHT SMALL FINGER;  Surgeon: Cindee Salt, MD;  Location: [facility];  Service: Orthopedics;  Laterality: Right;   INTRAUTERINE DEVICE INSERTION  08/09/2008   PARAGUARD- removed   kidney surgery  1995   abcess   LAPAROSCOPY N/A 06/06/2013   Procedure: LAPAROSCOPY DIAGNOSTIC;  Surgeon: Ok Edwards, MD;  Location: [facility];   Service: Gynecology;  Laterality: N/A;   LAPAROTOMY  06/06/2013   Procedure: EXPLORATORY LAPAROTOMY;  Surgeon: Ok Edwards, MD;  Location: [facility];  Service: Gynecology;;   LIVER BIOPSY     radial tunnel Bilateral 2005   VAGINAL HYSTERECTOMY Left 06/06/2013   Procedure: Vaginal Hysterectomy with Patiial Right Salpingectomy;  Surgeon: Ok Edwards, MD;  Location: [facility];  Service: Gynecology;  Laterality: Left;   Social History   Tobacco Use   Smoking status: Former    Current packs/day: 0.00    Types: Cigarettes    Quit date: 01/26/2012    Years since quitting: 11.4   Smokeless tobacco: Never  Vaping Use   Vaping status: Never Used  Substance Use Topics   Alcohol use: No    Alcohol/week: 0.0 standard drinks of alcohol    Comment: History of alcohol abuse   Drug use: No   Family History  Problem Relation Age of Onset   Cancer Mother    Breast cancer Mother    Depression Mother    Anxiety disorder Mother    Obesity Mother    Sleep apnea Mother    Obesity Father    Anxiety disorder Father    Cancer Father    Hyperlipidemia Father    Colon polyps Father    Diabetes Father    Cirrhosis Father    Liver cancer Father    Liver disease Father    Alcoholism Father    Depression Father    Alcohol abuse Father    Sleep apnea Father    Irritable bowel syndrome Sister    Sleep apnea Brother    Breast cancer Maternal Aunt    Alcoholism Maternal Aunt    Alcoholism Maternal Grandmother    Alcoholism Maternal Grandfather    Breast cancer Paternal Grandmother    Colon cancer Paternal Grandmother    Esophageal cancer Paternal Grandfather    Stomach cancer Paternal Grandfather    Alcoholism Paternal Grandfather    Rectal cancer Neg Hx    Allergies  Allergen Reactions   Benadryl [Diphenhydramine Hcl] Hives   Ropinirole Other (See Comments)   Levofloxacin Other (See Comments)   Aspirin    Codeine    Doxycycline    Lisinopril-Hydrochlorothiazide Other (See Comments)     Dry cough, dry eyes, insomnia, emotionally liable, increased thirst, brain fog, fatigue.   Metformin And Related     Blurred vision   Nuvigil [Armodafinil]     migraines    Olmesartan Other (See Comments)    May have contributed to hyponatremia.  Baseline sodium 131-133.   Requip [Ropinirole Hcl]     Increased headache   Semaglutide Other (See Comments)    Rybelsus caused depression and fibromyalgia flair.   Tramadol     Other reaction(s): Other (See Comments)   Wellbutrin [Bupropion]    Zolpidem       ROS Negative unless stated above    Objective:  BP 116/78 (BP Location: Right Wrist, Patient Position: Sitting, Cuff Size: Normal)   Pulse 83   Temp 99.4 F (37.4 C) (Oral)   Wt 231 lb 9.6 oz (105.1 kg)   LMP 04/25/2013   SpO2 95%   BMI 39.75 kg/m  BP Readings from Last 3 Encounters:  07/11/23 116/78  07/10/23 127/66  06/21/23 138/71   Wt Readings from Last 3 Encounters:  07/11/23 231 lb 9.6 oz (105.1 kg)  07/09/23 234 lb (106.1 kg)  06/21/23 235 lb (106.6 kg)      Physical Exam Constitutional:      General: She is not in acute distress.    Appearance: Normal appearance.  HENT:     Head: Normocephalic and atraumatic.     Right Ear: Hearing and ear canal normal. Tympanic membrane is not erythematous.     Left Ear: Hearing, tympanic membrane and ear canal normal. Tympanic membrane is not erythematous.     Ears:     Comments: Right TM full, no effusion noted.    Nose: Nose normal.     Mouth/Throat:     Mouth: Mucous membranes are moist.  Eyes:     Extraocular Movements: Extraocular movements intact.     Conjunctiva/sclera: Conjunctivae normal.  Cardiovascular:     Rate and Rhythm: Normal rate and regular rhythm.     Heart sounds: Normal heart sounds. No murmur heard.    No gallop.  Pulmonary:     Effort: Pulmonary effort is normal. No respiratory distress.     Breath sounds: Normal breath sounds. No wheezing, rhonchi or rales.  Musculoskeletal:      Right lower leg: No edema.     Left lower leg: No edema.  Skin:    General: Skin is warm and dry.     Comments: Mild hyperpigmentation on bilateral LEs.  Several mildly erythematous papules on left ankle.  Neurological:     Mental Status: She is alert and oriented to person, place, and time.      No results found for any visits on 07/11/23.    Assessment & Plan:  Hyponatremia -Sodium 129 on 07/10/2023 -     Basic metabolic panel; Future  Essential hypertension -     Carvedilol; Take 1 tablet (6.25 mg total) by mouth 2 (two) times daily with a meal.  Dispense: 60 tablet; Refill: 1  Bilateral leg edema -Resolved -Likely pitting edema 2/2 recent travel.  Insect bite of left ankle, initial encounter -Nonpruritic -If become pruritic can use OTC cortisone cream.  Hypocalcemia -Calcium 8.6 on 07/10/2023 -Will recheck in a few days at CPE.  Discussed possible causes of hyponatremia.  May have been exacerbated by recent switch in BP medication.  As this is the only recent change discussed stopping olmesartan.  Patient has several drug sensitivities/allergies.  In the past lisinopril-HCTZ discontinued due to side effects of dry cough, dry eyes, insomnia, emotional reliable, increased thirst, brain fog, fatigue. Will start Coreg 6.25 mg twice daily.  Pt to monitor BP at home.  Has appointment in 2 days for CPE.  Will attempt to obtain labs at that visit.  Given strict precautions.   Return in about 1 week (around 07/18/2023), or if symptoms worsen or fail to improve.   Deeann Saint, MD

## 2023-07-11 NOTE — Transitions of Care (Post Inpatient/ED Visit) (Signed)
   07/11/2023  Name: Gloria [name] MRN: [id number] DOB: Jun 16, 1971  Today's TOC FU Call Status: Today's TOC FU Call Status:: Unsuccessful Call (1st Attempt) Unsuccessful Call (1st Attempt) Date: 07/11/23  Attempted to reach the patient regarding the most recent Inpatient/ED visit.  Follow Up Plan: No further outreach attempts will be made at this time. We have been unable to contact the patient.  Pt has fu appt 07-11-23 at 345pm  Signature   Woodfin Ganja LPN [facility] Nurse Health Advisor Direct Dial [phone]

## 2023-07-13 ENCOUNTER — Ambulatory Visit: Payer: 59 | Admitting: Family Medicine

## 2023-07-13 ENCOUNTER — Encounter: Payer: Self-pay | Admitting: Family Medicine

## 2023-07-13 VITALS — BP 110/70 | HR 71 | Temp 98.0°F | Ht 66.0 in | Wt 231.4 lb

## 2023-07-13 DIAGNOSIS — R5383 Other fatigue: Secondary | ICD-10-CM | POA: Diagnosis not present

## 2023-07-13 DIAGNOSIS — E782 Mixed hyperlipidemia: Secondary | ICD-10-CM | POA: Diagnosis not present

## 2023-07-13 DIAGNOSIS — Z0000 Encounter for general adult medical examination without abnormal findings: Secondary | ICD-10-CM

## 2023-07-13 DIAGNOSIS — I10 Essential (primary) hypertension: Secondary | ICD-10-CM

## 2023-07-13 DIAGNOSIS — R0981 Nasal congestion: Secondary | ICD-10-CM

## 2023-07-13 DIAGNOSIS — Z0001 Encounter for general adult medical examination with abnormal findings: Secondary | ICD-10-CM

## 2023-07-13 DIAGNOSIS — E871 Hypo-osmolality and hyponatremia: Secondary | ICD-10-CM | POA: Diagnosis not present

## 2023-07-13 DIAGNOSIS — E1169 Type 2 diabetes mellitus with other specified complication: Secondary | ICD-10-CM | POA: Diagnosis not present

## 2023-07-13 DIAGNOSIS — Z7985 Long-term (current) use of injectable non-insulin antidiabetic drugs: Secondary | ICD-10-CM

## 2023-07-13 LAB — CBC WITH DIFFERENTIAL/PLATELET
Basophils Absolute: 0.1 10*3/uL (ref 0.0–0.1)
Basophils Relative: 0.8 % (ref 0.0–3.0)
Eosinophils Absolute: 0.2 10*3/uL (ref 0.0–0.7)
Eosinophils Relative: 2.7 % (ref 0.0–5.0)
HCT: 40.5 % (ref 36.0–46.0)
Hemoglobin: 13.2 g/dL (ref 12.0–15.0)
Lymphocytes Relative: 26.5 % (ref 12.0–46.0)
Lymphs Abs: 2.1 10*3/uL (ref 0.7–4.0)
MCHC: 32.5 g/dL (ref 30.0–36.0)
MCV: 87.1 fl (ref 78.0–100.0)
Monocytes Absolute: 0.8 10*3/uL (ref 0.1–1.0)
Monocytes Relative: 9.4 % (ref 3.0–12.0)
Neutro Abs: 4.8 10*3/uL (ref 1.4–7.7)
Neutrophils Relative %: 60.6 % (ref 43.0–77.0)
Platelets: 311 10*3/uL (ref 150.0–400.0)
RBC: 4.65 Mil/uL (ref 3.87–5.11)
RDW: 13.8 % (ref 11.5–15.5)
WBC: 8 10*3/uL (ref 4.0–10.5)

## 2023-07-13 LAB — COMPREHENSIVE METABOLIC PANEL
ALT: 21 U/L (ref 0–35)
AST: 19 U/L (ref 0–37)
Albumin: 4.1 g/dL (ref 3.5–5.2)
Alkaline Phosphatase: 55 U/L (ref 39–117)
BUN: 9 mg/dL (ref 6–23)
CO2: 28 meq/L (ref 19–32)
Calcium: 9.2 mg/dL (ref 8.4–10.5)
Chloride: 96 meq/L (ref 96–112)
Creatinine, Ser: 0.59 mg/dL (ref 0.40–1.20)
GFR: 102.86 mL/min (ref >60.00)
Glucose, Bld: 105 mg/dL — ABNORMAL HIGH (ref 70–99)
Potassium: 4.4 meq/L (ref 3.5–5.1)
Sodium: 129 meq/L — ABNORMAL LOW (ref 135–145)
Total Bilirubin: 0.3 mg/dL (ref 0.2–1.2)
Total Protein: 7.3 g/dL (ref 6.0–8.3)

## 2023-07-13 LAB — BASIC METABOLIC PANEL
BUN: 9 mg/dL (ref 6–23)
CO2: 28 meq/L (ref 19–32)
Calcium: 9.2 mg/dL (ref 8.4–10.5)
Chloride: 96 meq/L (ref 96–112)
Creatinine, Ser: 0.59 mg/dL (ref 0.40–1.20)
GFR: 102.86 mL/min (ref >60.00)
Glucose, Bld: 105 mg/dL — ABNORMAL HIGH (ref 70–99)
Potassium: 4.4 meq/L (ref 3.5–5.1)
Sodium: 129 meq/L — ABNORMAL LOW (ref 135–145)

## 2023-07-13 LAB — LIPID PANEL
Cholesterol: 259 mg/dL — ABNORMAL HIGH (ref 0–200)
HDL: 44.2 mg/dL (ref >39.00)
LDL Cholesterol: 176 mg/dL — ABNORMAL HIGH (ref 0–99)
NonHDL: 214.94
Total CHOL/HDL Ratio: 6
Triglycerides: 195 mg/dL — ABNORMAL HIGH (ref 0.0–149.0)
VLDL: 39 mg/dL (ref 0.0–40.0)

## 2023-07-13 LAB — MICROALBUMIN / CREATININE URINE RATIO
Creatinine,U: 126.9 mg/dL
Microalb Creat Ratio: 0.6 mg/g (ref 0.0–30.0)
Microalb, Ur: 0.7 mg/dL (ref 0.0–1.9)

## 2023-07-13 LAB — HEMOGLOBIN A1C: Hgb A1c MFr Bld: 5.2 % (ref 4.6–6.5)

## 2023-07-13 MED ORDER — AMOXICILLIN-POT CLAVULANATE 500-125 MG PO TABS: 1.0000 | ORAL_TABLET | Freq: Two times a day (BID) | ORAL | 0 refills | Status: DC

## 2023-07-13 NOTE — Progress Notes (Signed)
Established Patient Office Visit   Subjective  Patient ID: Gloria [name], female    DOB: 1971/11/19  Age: 52 y.o. MRN: [id number]  Chief Complaint  Patient presents with   Annual Exam    Patient is a 52 year old female seen for CPE.  Patient seen 2 days ago for HFU for hyponatremia.  Will obtain repeat sodium level this visit as sodium was.  Patient notes continued fatigue.  Also with sinus congestion x 4 days.    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   DDD (degenerative disc disease), lumbar    Depression    Diabetes (HCC)    Diverticulosis    Drug use    Elevated LFTs    Fibromyalgia    GERD (gastroesophageal reflux disease)    History of ETOH abuse    Hyperlipidemia    Hyperplastic colon polyp    Internal hemorrhoids    Migraines    NAFLD (nonalcoholic fatty liver disease)    Obese    Obstructive sleep apnea 02/25/2015   not diagnosed per pt 04-03-20   Osteoarthritis    lower back- DDD   Periodic limb movement disorder 02/25/2014   Pre-diabetes    pt taking metformin   RLS (restless legs syndrome)    Sleep apnea    Smoker    Snoring    Substance abuse (HCC)    Swallowing difficulty    Vitamin B12 deficiency    Vitamin D deficiency    Past Surgical History:  Procedure Laterality Date   CERVICAL FUSION     COLONOSCOPY     CYSTOSCOPY  06/06/2013   Procedure: CYSTOSCOPY;  Surgeon: Ok Edwards, MD;  Location: [facility];  Service: Gynecology;;   EXCISION METACARPAL MASS Right 09/03/2021   Procedure: EXCISION METACARPAL MASS RIGHT SMALL FINGER;  Surgeon: Cindee Salt, MD;  Location: [facility];  Service: Orthopedics;   Laterality: Right;   INTRAUTERINE DEVICE INSERTION  08/09/2008   PARAGUARD- removed   kidney surgery  1995   abcess   LAPAROSCOPY N/A 06/06/2013   Procedure: LAPAROSCOPY DIAGNOSTIC;  Surgeon: Ok Edwards, MD;  Location: [facility];  Service: Gynecology;  Laterality: N/A;   LAPAROTOMY  06/06/2013   Procedure: EXPLORATORY LAPAROTOMY;  Surgeon: Ok Edwards, MD;  Location: [facility];  Service: Gynecology;;   LIVER BIOPSY     radial tunnel Bilateral 2005   VAGINAL HYSTERECTOMY Left 06/06/2013   Procedure: Vaginal Hysterectomy with Patiial Right Salpingectomy;  Surgeon: Ok Edwards, MD;  Location: [facility];  Service: Gynecology;  Laterality: Left;   Social History   Tobacco Use   Smoking status: Former    Current packs/day: 0.00    Types: Cigarettes    Quit date: 01/26/2012    Years since quitting: 11.4   Smokeless tobacco: Never  Vaping Use   Vaping status: Never Used  Substance Use Topics   Alcohol use: No    Alcohol/week: 0.0 standard drinks of alcohol    Comment: History of alcohol abuse   Drug  use: No   Family History  Problem Relation Age of Onset   Cancer Mother    Breast cancer Mother    Depression Mother    Anxiety disorder Mother    Obesity Mother    Sleep apnea Mother    Obesity Father    Anxiety disorder Father    Cancer Father    Hyperlipidemia Father    Colon polyps Father    Diabetes Father    Cirrhosis Father    Liver cancer Father    Liver disease Father    Alcoholism Father    Depression Father    Alcohol abuse Father    Sleep apnea Father    Irritable bowel syndrome Sister    Sleep apnea Brother    Breast cancer Maternal Aunt    Alcoholism Maternal Aunt    Alcoholism Maternal Grandmother    Alcoholism Maternal Grandfather    Breast cancer Paternal Grandmother    Colon cancer Paternal Grandmother    Esophageal cancer Paternal Grandfather    Stomach cancer Paternal Grandfather    Alcoholism Paternal Grandfather    Rectal cancer Neg Hx     Allergies  Allergen Reactions   Benadryl [Diphenhydramine Hcl] Hives   Ropinirole Other (See Comments)   Levofloxacin Other (See Comments)   Aspirin    Codeine    Doxycycline    Lisinopril-Hydrochlorothiazide Other (See Comments)    Dry cough, dry eyes, insomnia, emotionally liable, increased thirst, brain fog, fatigue.   Metformin And Related     Blurred vision   Nuvigil [Armodafinil]     migraines    Olmesartan Other (See Comments)    May have contributed to hyponatremia.  Baseline sodium 131-133.   Requip [Ropinirole Hcl]     Increased headache   Semaglutide Other (See Comments)    Rybelsus caused depression and fibromyalgia flair.   Tramadol     Other reaction(s): Other (See Comments)   Wellbutrin [Bupropion]    Zolpidem       ROS Negative unless stated above    Objective:     BP 110/70 (BP Location: Right Wrist, Patient Position: Sitting, Cuff Size: Normal)   Pulse 71   Temp 98 F (36.7 C) (Oral)   Ht 5\' 6"  (1.676 m)   Wt 231 lb 6.4 oz (105 kg)   LMP 04/25/2013   SpO2 98%   BMI 37.35 kg/m    Physical Exam Constitutional:      Appearance: Normal appearance.  HENT:     Head: Normocephalic and atraumatic.     Right Ear: Tympanic membrane, ear canal and external ear normal.     Left Ear: Tympanic membrane, ear canal and external ear normal.     Nose: Nose normal.     Mouth/Throat:     Mouth: Mucous membranes are moist.     Pharynx: No oropharyngeal exudate or posterior oropharyngeal erythema.  Eyes:     General: No scleral icterus.    Extraocular Movements: Extraocular movements intact.     Conjunctiva/sclera: Conjunctivae normal.     Pupils: Pupils are equal, round, and reactive to light.  Neck:     Thyroid: No thyromegaly.  Cardiovascular:     Rate and Rhythm: Normal rate and regular rhythm.     Pulses: Normal pulses.     Heart sounds: Normal heart sounds. No murmur heard.    No friction rub.  Pulmonary:     Effort: Pulmonary effort is  normal.     Breath sounds: Normal  breath sounds. No wheezing, rhonchi or rales.  Abdominal:     General: Bowel sounds are normal.     Palpations: Abdomen is soft.     Tenderness: There is no abdominal tenderness.  Musculoskeletal:        General: No deformity. Normal range of motion.  Lymphadenopathy:     Cervical: No cervical adenopathy.  Skin:    General: Skin is warm and dry.     Findings: No lesion.  Neurological:     General: No focal deficit present.     Mental Status: She is alert and oriented to person, place, and time.  Psychiatric:        Mood and Affect: Mood normal.        Thought Content: Thought content normal.    Diabetic Foot Exam - Simple   Simple Foot Form Diabetic Foot exam was performed with the following findings: Yes 07/13/2023  9:31 AM  Visual Inspection No deformities, no ulcerations, no other skin breakdown bilaterally: Yes See comments: Yes Sensation Testing Intact to touch and monofilament testing bilaterally: Yes Pulse Check Posterior Tibialis and Dorsalis pulse intact bilaterally: Yes Comments Bunions of bilateral feet.       07/13/2023    9:41 AM 05/11/2023    4:42 PM 11/25/2022    2:26 PM  Depression screen PHQ 2/9  Decreased Interest 0 1 0  Down, Depressed, Hopeless 1 1 0  PHQ - 2 Score 1 2 0  Altered sleeping 1 1 0  Tired, decreased energy 1 1 1   Change in appetite 0 1 0  Feeling bad or failure about yourself  0 0 0  Trouble concentrating 0 1 0  Moving slowly or fidgety/restless 0 0 0  Suicidal thoughts 0 0 0  PHQ-9 Score 3 6 1   Difficult doing work/chores Not difficult at all Somewhat difficult       07/13/2023    9:41 AM 05/11/2023    4:43 PM 09/19/2020    3:42 PM  GAD 7 : Generalized Anxiety Score  Nervous, Anxious, on Edge 0 1 1  Control/stop worrying 0 0 0  Worry too much - different things 0 0 0  Trouble relaxing 0 0 0  Restless 0 0 0  Easily annoyed or irritable 0 1 1  Afraid - awful might happen 0 0 0  Total GAD 7  Score 0 2 2  Anxiety Difficulty  Somewhat difficult Somewhat difficult      No results found for any visits on 07/13/23.    Assessment & Plan:  Encounter for well adult exam with abnormal findings -Age-appropriate health screenings discussed -obtain labs -Immunizations reviewed -Colonoscopy done 04/17/2020 -Mammogram done 10/22/2022 -Last Pap 11/11/2022 -Next CPE in 1 year  Sinus congestion -Likely developing sinusitis. -Patient encouraged to continue daily antihistamine and use nasal spray such as Flonase. -Given wait-and-see Rx to pick up at the end of the week for continued or worsened sinus symptoms -     Amoxicillin-Pot Clavulanate; Take 1 tablet by mouth in the morning and at bedtime for 7 days.  Dispense: 14 tablet; Refill: 0  Type 2 diabetes mellitus with other specified complication, without long-term current use of insulin (HCC) -Hemoglobin A1c 5.6% on 12/28/2021 -Continue Mounjaro 15 mg weekly for weight management clinic. -     Microalbumin / creatinine urine ratio -     Hemoglobin A1c  Hyponatremia -Sodium was 129 on 07/10/2023.  Patient's baseline sodium 131-133 -Olmesartan was stopped as thought it may be contributing  to hyponatremia.  Longstanding BH meds may also be contributing. -Recheck this visit -Hypocalcemia also noted as calcium 8.6 on 07/10/2023. -     Comprehensive metabolic panel  Mixed hyperlipidemia -Total cholesterol 216, HDL 44, LDL 139, triglycerides 185 on 07/14/2022 -     Comprehensive metabolic panel -     Lipid panel  Essential hypertension -Controlled -Continue current medication including Coreg 6.25 mg twice daily -Olmesartan d/c at last OFV as thought to be contributing to hyponatremia. -     CBC with Differential/Platelet; Future -     TSH -     T4, free  Fatigue, unspecified type -     Comprehensive metabolic panel -     CBC with Differential/Platelet; Future -     TSH -     T4, free -     Hemoglobin A1c -     VITAMIN D 25  Hydroxy (Vit-D Deficiency, Fractures)    Return if symptoms worsen or fail to improve.   Deeann Saint, MD

## 2023-07-15 LAB — TSH: TSH: 1.94 u[IU]/mL (ref 0.35–5.50)

## 2023-07-15 LAB — T4, FREE: Free T4: 0.8 ng/dL (ref 0.60–1.60)

## 2023-07-15 LAB — VITAMIN D 25 HYDROXY (VIT D DEFICIENCY, FRACTURES): VITD: 76.34 ng/mL (ref 30.00–100.00)

## 2023-07-18 ENCOUNTER — Encounter: Payer: Self-pay | Admitting: Family Medicine

## 2023-07-18 ENCOUNTER — Other Ambulatory Visit: Payer: Self-pay

## 2023-07-18 ENCOUNTER — Inpatient Hospital Stay (HOSPITAL_BASED_OUTPATIENT_CLINIC_OR_DEPARTMENT_OTHER)
Admission: EM | Admit: 2023-07-18 | Discharge: 2023-07-20 | DRG: 641 | Disposition: A | Discharge status: Home / Self Care | Payer: 59 | Attending: Internal Medicine | Admitting: Internal Medicine

## 2023-07-18 ENCOUNTER — Encounter (INDEPENDENT_AMBULATORY_CARE_PROVIDER_SITE_OTHER): Payer: Self-pay | Admitting: Family Medicine

## 2023-07-18 ENCOUNTER — Telehealth: Payer: Self-pay | Admitting: Family Medicine

## 2023-07-18 ENCOUNTER — Encounter (HOSPITAL_BASED_OUTPATIENT_CLINIC_OR_DEPARTMENT_OTHER): Payer: Self-pay | Admitting: Emergency Medicine

## 2023-07-18 DIAGNOSIS — Z87891 Personal history of nicotine dependence: Secondary | ICD-10-CM

## 2023-07-18 DIAGNOSIS — E119 Type 2 diabetes mellitus without complications: Secondary | ICD-10-CM

## 2023-07-18 DIAGNOSIS — Z881 Allergy status to other antibiotic agents status: Secondary | ICD-10-CM

## 2023-07-18 DIAGNOSIS — Z811 Family history of alcohol abuse and dependence: Secondary | ICD-10-CM

## 2023-07-18 DIAGNOSIS — Z79899 Other long term (current) drug therapy: Secondary | ICD-10-CM

## 2023-07-18 DIAGNOSIS — F418 Other specified anxiety disorders: Secondary | ICD-10-CM | POA: Diagnosis present

## 2023-07-18 DIAGNOSIS — Z9079 Acquired absence of other genital organ(s): Secondary | ICD-10-CM

## 2023-07-18 DIAGNOSIS — E538 Deficiency of other specified B group vitamins: Secondary | ICD-10-CM | POA: Diagnosis present

## 2023-07-18 DIAGNOSIS — E669 Obesity, unspecified: Secondary | ICD-10-CM

## 2023-07-18 DIAGNOSIS — E871 Hypo-osmolality and hyponatremia: Secondary | ICD-10-CM | POA: Diagnosis not present

## 2023-07-18 DIAGNOSIS — E1169 Type 2 diabetes mellitus with other specified complication: Secondary | ICD-10-CM

## 2023-07-18 DIAGNOSIS — Z885 Allergy status to narcotic agent status: Secondary | ICD-10-CM

## 2023-07-18 DIAGNOSIS — E785 Hyperlipidemia, unspecified: Secondary | ICD-10-CM | POA: Diagnosis present

## 2023-07-18 DIAGNOSIS — Z713 Dietary counseling and surveillance: Secondary | ICD-10-CM

## 2023-07-18 DIAGNOSIS — Z6372 Alcoholism and drug addiction in family: Secondary | ICD-10-CM

## 2023-07-18 DIAGNOSIS — K219 Gastro-esophageal reflux disease without esophagitis: Secondary | ICD-10-CM | POA: Diagnosis present

## 2023-07-18 DIAGNOSIS — M549 Dorsalgia, unspecified: Secondary | ICD-10-CM | POA: Diagnosis present

## 2023-07-18 DIAGNOSIS — M797 Fibromyalgia: Secondary | ICD-10-CM | POA: Diagnosis present

## 2023-07-18 DIAGNOSIS — F32A Depression, unspecified: Secondary | ICD-10-CM | POA: Diagnosis present

## 2023-07-18 DIAGNOSIS — G2581 Restless legs syndrome: Secondary | ICD-10-CM | POA: Diagnosis present

## 2023-07-18 DIAGNOSIS — Z90710 Acquired absence of both cervix and uterus: Secondary | ICD-10-CM

## 2023-07-18 DIAGNOSIS — K732 Chronic active hepatitis, not elsewhere classified: Secondary | ICD-10-CM | POA: Diagnosis present

## 2023-07-18 DIAGNOSIS — Z6838 Body mass index (BMI) 38.0-38.9, adult: Secondary | ICD-10-CM

## 2023-07-18 DIAGNOSIS — E6609 Other obesity due to excess calories: Secondary | ICD-10-CM

## 2023-07-18 DIAGNOSIS — Z7985 Long-term (current) use of injectable non-insulin antidiabetic drugs: Secondary | ICD-10-CM

## 2023-07-18 DIAGNOSIS — Z886 Allergy status to analgesic agent status: Secondary | ICD-10-CM

## 2023-07-18 DIAGNOSIS — F419 Anxiety disorder, unspecified: Secondary | ICD-10-CM | POA: Diagnosis present

## 2023-07-18 DIAGNOSIS — Z981 Arthrodesis status: Secondary | ICD-10-CM

## 2023-07-18 DIAGNOSIS — G4733 Obstructive sleep apnea (adult) (pediatric): Secondary | ICD-10-CM | POA: Diagnosis present

## 2023-07-18 DIAGNOSIS — G9332 Myalgic encephalomyelitis/chronic fatigue syndrome: Secondary | ICD-10-CM | POA: Diagnosis present

## 2023-07-18 DIAGNOSIS — E861 Hypovolemia: Secondary | ICD-10-CM | POA: Diagnosis present

## 2023-07-18 DIAGNOSIS — Z6837 Body mass index (BMI) 37.0-37.9, adult: Secondary | ICD-10-CM

## 2023-07-18 DIAGNOSIS — F1011 Alcohol abuse, in remission: Secondary | ICD-10-CM | POA: Diagnosis present

## 2023-07-18 DIAGNOSIS — Z888 Allergy status to other drugs, medicaments and biological substances status: Secondary | ICD-10-CM

## 2023-07-18 DIAGNOSIS — I10 Essential (primary) hypertension: Secondary | ICD-10-CM | POA: Diagnosis present

## 2023-07-18 DIAGNOSIS — E559 Vitamin D deficiency, unspecified: Secondary | ICD-10-CM | POA: Diagnosis present

## 2023-07-18 DIAGNOSIS — K760 Fatty (change of) liver, not elsewhere classified: Secondary | ICD-10-CM | POA: Diagnosis present

## 2023-07-18 DIAGNOSIS — Z818 Family history of other mental and behavioral disorders: Secondary | ICD-10-CM

## 2023-07-18 DIAGNOSIS — Z833 Family history of diabetes mellitus: Secondary | ICD-10-CM

## 2023-07-18 DIAGNOSIS — G8929 Other chronic pain: Secondary | ICD-10-CM | POA: Diagnosis present

## 2023-07-18 LAB — BASIC METABOLIC PANEL
Anion gap: 7 (ref 5–15)
BUN: 10 mg/dL (ref 6–20)
CO2: 26 mmol/L (ref 22–32)
Calcium: 9.4 mg/dL (ref 8.9–10.3)
Chloride: 91 mmol/L — ABNORMAL LOW (ref 98–111)
Creatinine, Ser: 0.64 mg/dL (ref 0.44–1.00)
GFR, Estimated: >60 mL/min (ref >60)
Glucose, Bld: 79 mg/dL (ref 70–99)
Potassium: 4.1 mmol/L (ref 3.5–5.1)
Sodium: 124 mmol/L — ABNORMAL LOW (ref 135–145)

## 2023-07-18 LAB — CBC
HCT: 39.7 % (ref 36.0–46.0)
Hemoglobin: 13.4 g/dL (ref 12.0–15.0)
MCH: 28.8 pg (ref 26.0–34.0)
MCHC: 33.8 g/dL (ref 30.0–36.0)
MCV: 85.4 fL (ref 80.0–100.0)
Platelets: 312 10*3/uL (ref 150–400)
RBC: 4.65 MIL/uL (ref 3.87–5.11)
RDW: 13.2 % (ref 11.5–15.5)
WBC: 10.4 10*3/uL (ref 4.0–10.5)
nRBC: 0 % (ref 0.0–0.2)

## 2023-07-18 LAB — URINALYSIS, ROUTINE W REFLEX MICROSCOPIC
Bacteria, UA: NONE SEEN
Bilirubin Urine: NEGATIVE
Glucose, UA: NEGATIVE mg/dL
Hgb urine dipstick: NEGATIVE
Ketones, ur: NEGATIVE mg/dL
Leukocytes,Ua: NEGATIVE
Nitrite: NEGATIVE
Protein, ur: NEGATIVE mg/dL
Specific Gravity, Urine: 1.018 (ref 1.005–1.030)
pH: 7 (ref 5.0–8.0)

## 2023-07-18 LAB — OSMOLALITY: Osmolality: 260 mOsm/kg — ABNORMAL LOW (ref 275–295)

## 2023-07-18 LAB — OSMOLALITY, URINE: Osmolality, Ur: 415 mOsm/kg (ref 300–900)

## 2023-07-18 MED ORDER — CARVEDILOL 6.25 MG PO TABS
6.2500 mg | ORAL_TABLET | Freq: Two times a day (BID) | ORAL | Status: DC
  Administered 2023-07-18: 6.25 mg via ORAL
  Filled 2023-07-18: qty 1

## 2023-07-18 NOTE — Telephone Encounter (Signed)
Pt was seen last week and had labs done.  Pt wanted to go over her results.   Pt stated she is feeling very "off".  Pt states she is very dizzy and feels like she is going to pass out.  Pt was transferred to the Triage Nurse for advice.

## 2023-07-18 NOTE — Progress Notes (Signed)
Plan of Care Note for accepted transfer   Patient: Gloria [name] MRN: [id number]   DOA: 07/18/2023  Facility requesting transfer: [facility]   Requesting Provider: Dr. Earlene Plater   Reason for transfer: Hyponatremia   Facility course: 52 year old female with HTN, HLD, T2DM, OSA, fibromyalgia, depression, anxiety, chronic pain, and recent admission for hyponatremia who presents to the emergency department after an episode of vertigo.  She is afebrile with stable vitals and has lab work notable for serum sodium of 124.  She appears euvolemic per report of ED physician.  Thyroid studies were normal a few days ago.  Urine sodium and serum and urine osmolality were ordered by the ED physician.  Plan of care: The patient is accepted for admission to Telemetry unit, at [facility].   Author: Briscoe Deutscher, MD 07/18/2023  Check [email or web address] for on-call coverage.  Nursing staff, Please call [facility] Admits & Consults System-Wide number on Amion as soon as patient's arrival, so appropriate admitting provider can evaluate the pt.

## 2023-07-18 NOTE — ED Triage Notes (Addendum)
Pt via pov from home with dizziness that has been intermittent. She was seen for similar issues on 8/17, symptoms returned today. She originally came in for elevated bp, it came back much lower, but pt then states she feels dizzy. She has recently been seen here for problems with her bp meds, and had that medication changed. She is due to follow up with pcp tomorrow. Pt alert & oriented, nad noted.

## 2023-07-18 NOTE — ED Provider Notes (Signed)
[place] EMERGENCY DEPARTMENT AT [facility] Provider Note   CSN: [id number] Arrival date & time: 07/18/23  1535     History {Add pertinent medical, surgical, social history, OB history to HPI:1} Chief Complaint  Patient presents with   Dizziness    Gloria [name] is a 52 y.o. female.   Dizziness 52 year old female history of chronic fatigue syndrome, diabetes, GERD, hyperlipidemia presenting for episode of dizziness.  Patient was recently admitted here August 17 and discharged August 18.  She presented that time with episode of lightheadedness as well as leg swelling after recent travel.  She was seen in the ED and had hyponatremia and was admitted.  Her sodium improved from 1 26-1 29 and she was discharged home.  Baseline appears to be 1 31-1 33.  She is followed by the PCP recently took her off olmesartan and started carvedilol.  She is been doing well.  However today at work she was standing she had sudden onset dizziness.  She felt like she was moving when she was not, did not have any syncope.  She is in trouble concentrating during this.  Last about 20 minutes and then resolved.  Since then she has been asymptomatic but presents for evaluation.  She has been eating and drinking okay, she been drinking less water recently after they told her to stop drinking so much water.  She has not had any leg swelling.  She is not on any diuretics.  She is on Trileptal which was thought to be potentially contributing but is been on it for a long time and states it is one of the medications that has helped her.     Home Medications Prior to Admission medications   Medication Sig Start Date End Date Taking? Authorizing Provider  amoxicillin-clavulanate (AUGMENTIN) 500-125 MG tablet Take 1 tablet by mouth in the morning and at bedtime for 7 days. 07/13/23 07/20/23  Deeann Saint, MD  azelastine (ASTELIN) 0.1 % nasal spray Place 2 sprays into both nostrils 2 (two) times daily. 09/10/22    Ardith Dark, MD  B Complex Vitamins (B COMPLEX PO) Take 1 tablet by mouth daily.    [provider]  blood glucose meter kit and supplies KIT Dispense based on patient and insurance preference. Use up to four times daily as directed. (FOR ICD-9 250.00, 250.01). 04/11/19   Deeann Saint, MD  Blood Glucose Monitoring Suppl (ONETOUCH VERIO FLEX SYSTEM) w/Device KIT Use to check Blood sugars twice daily before meals 04/12/19   Deeann Saint, MD  Brexpiprazole (REXULTI) 4 MG TABS Take by mouth.    [provider]  Calcium Carbonate-Vitamin D 600-400 MG-UNIT tablet Take 1 tablet by mouth daily.    [provider]  carvedilol (COREG) 6.25 MG tablet Take 1 tablet (6.25 mg total) by mouth 2 (two) times daily with a meal. 07/11/23   Deeann Saint, MD  clotrimazole-betamethasone (LOTRISONE) cream Apply 1 application topically 2 (two) times daily. For 10 days maximum 06/09/19   Shelva Majestic, MD  Coenzyme Q10 (COQ10) 100 MG CAPS Take 100 mg by mouth daily.    [provider]  diclofenac (VOLTAREN) 75 MG EC tablet Take 75 mg by mouth 2 (two) times daily. 10/21/21   [provider]  estradiol (VIVELLE-DOT) 0.05 MG/24HR patch Place 1 patch (0.05 mg total) onto the skin 2 (two) times a week. 11/11/22   Genia Del, MD  fluticasone (CUTIVATE) 0.05 % cream Apply topically 2 (two)  times daily.    [provider]  glucose blood (CONTOUR NEXT TEST) test strip Use to test blood glucose twice daily 01/11/20   Deeann Saint, MD  hydrOXYzine (ATARAX) 10 MG tablet Take 20 mg by mouth daily as needed. 11/23/22   [provider]  ibuprofen (ADVIL) 800 MG tablet TAKE 1 TABLET(800 MG) BY MOUTH EVERY 8 HOURS AS NEEDED FOR MODERATE PAIN 03/25/21   Deeann Saint, MD  ketoconazole (NIZORAL) 2 % cream Apply 1 application topically daily.    [provider]  Lancets (ONETOUCH DELICA PLUS LANCET30G) MISC USE 1  TO CHECK GLUCOSE 4 TIMES DAILY  10/11/19   Deeann Saint, MD  loratadine (CLARITIN) 10 MG tablet Take 10 mg by mouth daily.    [provider]  LORazepam (ATIVAN) 0.5 MG tablet Take 0.5 mg by mouth daily. 10/22/21   [provider]  Magnesium 500 MG CAPS Take by mouth.    [provider]  metroNIDAZOLE (METROCREAM) 0.75 % cream daily 11/21/19   [provider]  Multiple Vitamins-Minerals (MULTIVITAMIN PO) Take 1 tablet by mouth daily.     [provider]  OMEGA 3-6-9 FATTY ACIDS PO Take 500 mg by mouth daily.    [provider]  oxcarbazepine (TRILEPTAL) 600 MG tablet Take 600 mg by mouth 2 (two) times daily. 10/22/21   [provider]  pantoprazole (PROTONIX) 40 MG tablet TAKE 1 TABLET BY MOUTH DAILY Patient taking differently: as needed. 01/12/21   Deeann Saint, MD  rizatriptan (MAXALT) 10 MG tablet TAKE 1 TABLET BY MOUTH AS NEEDED FOR MIGRAINE 03/03/23   Deeann Saint, MD  thiamine (VITAMIN B-1) 50 MG tablet Take 50 mg by mouth daily.    [provider]  tirzepatide Greggory Keen) 15 MG/0.5ML Pen Inject 15 mg into the skin once a week. 06/21/23   Quillian Quince D, MD  vitamin C (ASCORBIC ACID) 500 MG tablet Take 500 mg by mouth daily.    [provider]  VITAMIN E PO Take 900 mg by mouth daily.    [provider]      Allergies    Benadryl [diphenhydramine hcl], Ropinirole, Levofloxacin, Aspirin, Codeine, Doxycycline, Lisinopril-hydrochlorothiazide, Metformin and related, Nuvigil [armodafinil], Olmesartan, Requip [ropinirole hcl], Semaglutide, Tramadol, Wellbutrin [bupropion], and Zolpidem    Review of Systems   Review of Systems  Neurological:  Positive for dizziness.  Review of systems completed and notable as per HPI.  ROS otherwise negative.   Physical Exam Updated Vital Signs BP (!) 162/83 (BP Location: Left Arm)   Pulse 66   Temp 98.4 F (36.9 C) (Oral)   Resp 18   Ht 5\' 6"  (1.676 m)   Wt 105 kg   LMP 04/25/2013    SpO2 97%   BMI 37.36 kg/m  Physical Exam Vitals and nursing note reviewed.  Constitutional:      General: She is not in acute distress.    Appearance: She is well-developed.  HENT:     Head: Normocephalic and atraumatic.     Nose: Nose normal.     Mouth/Throat:     Mouth: Mucous membranes are moist.     Pharynx: Oropharynx is clear.  Eyes:     Extraocular Movements: Extraocular movements intact.     Conjunctiva/sclera: Conjunctivae normal.     Pupils: Pupils are equal, round, and reactive to light.  Cardiovascular:     Rate and Rhythm: Normal rate and regular rhythm.     Heart sounds:  No murmur heard. Pulmonary:     Effort: Pulmonary effort is normal. No respiratory distress.     Breath sounds: Normal breath sounds.  Abdominal:     Palpations: Abdomen is soft.     Tenderness: There is no abdominal tenderness. There is no guarding or rebound.  Musculoskeletal:        General: No swelling.     Cervical back: Neck supple.     Right lower leg: No edema.     Left lower leg: No edema.  Skin:    General: Skin is warm and dry.     Capillary Refill: Capillary refill takes less than 2 seconds.  Neurological:     General: No focal deficit present.     Mental Status: She is alert and oriented to person, place, and time. Mental status is at baseline.     Cranial Nerves: No cranial nerve deficit.     Sensory: No sensory deficit.     Motor: No weakness.     Coordination: Coordination normal.     Gait: Gait normal.     Deep Tendon Reflexes: Reflexes normal.  Psychiatric:        Mood and Affect: Mood normal.     ED Results / Procedures / Treatments   Labs (all labs ordered are listed, but only abnormal results are displayed) Labs Reviewed  URINALYSIS, ROUTINE W REFLEX MICROSCOPIC - Abnormal; Notable for the following components:      Result Value   APPearance CLOUDY (*)    All other components within normal limits  CBC  BASIC METABOLIC PANEL  CBG MONITORING, ED     EKG EKG Interpretation Date/Time:  Monday July 18 2023 15:59:54 EDT Ventricular Rate:  69 PR Interval:  188 QRS Duration:  88 QT Interval:  390 QTC Calculation: 417 R Axis:   32  Text Interpretation: Normal sinus rhythm with sinus arrhythmia Normal ECG No previous ECGs available Confirmed by Fulton Reek [phone]) on 07/18/2023 4:42:26 PM  Radiology No results found.  Procedures Procedures  {Document cardiac monitor, telemetry assessment procedure when appropriate:1}  Medications Ordered in ED Medications - No data to display  ED Course/ Medical Decision Making/ A&P Clinical Course as of 07/18/23 1817  Mon Jul 18, 2023  1756 Thursday started  [JD]    Clinical Course User Index [JD] Laurence Spates, MD   {   Click here for ABCD2, HEART and other calculatorsREFRESH Note before signing :1}                              Medical Decision Making Amount and/or Complexity of Data Reviewed Labs: ordered.   Medical Decision Making:   ROZALYNN ENSLOW is a 52 y.o. female who presented to the ED today with episode of dizziness.  Vital signs reviewed noted for mild hypertension.  On exam she has well-appearing, currently asymptomatic.  I did stand her and she had this moment of dizziness with a somewhat similar to her prior episode and she had a similar episode before her recent admission.  Clear if this is presyncope versus vertigo.  I do not see any signs concerning for central vertigo, TIA, stroke.  She appears euvolemic, and no other clear abnormality.  Her lab work is notable for sodium of 124 which is down 5 points from a few days ago.  This could be contributing.  Unclear cause, she is on Trileptal which could be causing this but  not a new medication.  No other clear cause.  Will add on some urine studies and serum studies to evaluate cause.   {crccomplexity:27900} Reviewed and confirmed nursing documentation for past medical history, family history, social  history.  Initial Study Results:   Laboratory  All laboratory results reviewed.  Labs notable for ***  ***EKG EKG was reviewed independently. Rate, rhythm, axis, intervals all examined and without medically relevant abnormality. ST segments without concerns for elevations.    Radiology:  All images reviewed independently. ***Agree with radiology report at this time.      Consults: Case discussed with ***.   Reassessment and Plan:   ***    Patient's presentation is most consistent with {EM COPA:27473}     {Document critical care time when appropriate:1} {Document review of labs and clinical decision tools ie heart score, Chads2Vasc2 etc:1}  {Document your independent review of radiology images, and any outside records:1} {Document your discussion with family members, caretakers, and with consultants:1} {Document social determinants of health affecting pt's care:1} {Document your decision making why or why not admission, treatments were needed:1} Final Clinical Impression(s) / ED Diagnoses Final diagnoses:  None    Rx / DC Orders ED Discharge Orders     None

## 2023-07-19 ENCOUNTER — Ambulatory Visit: Payer: 59 | Admitting: Family Medicine

## 2023-07-19 ENCOUNTER — Ambulatory Visit (INDEPENDENT_AMBULATORY_CARE_PROVIDER_SITE_OTHER): Payer: 59 | Admitting: Family Medicine

## 2023-07-19 DIAGNOSIS — E871 Hypo-osmolality and hyponatremia: Secondary | ICD-10-CM

## 2023-07-19 DIAGNOSIS — F1011 Alcohol abuse, in remission: Secondary | ICD-10-CM | POA: Diagnosis present

## 2023-07-19 DIAGNOSIS — F418 Other specified anxiety disorders: Secondary | ICD-10-CM | POA: Diagnosis not present

## 2023-07-19 DIAGNOSIS — K219 Gastro-esophageal reflux disease without esophagitis: Secondary | ICD-10-CM | POA: Diagnosis present

## 2023-07-19 DIAGNOSIS — G4733 Obstructive sleep apnea (adult) (pediatric): Secondary | ICD-10-CM | POA: Diagnosis present

## 2023-07-19 DIAGNOSIS — E538 Deficiency of other specified B group vitamins: Secondary | ICD-10-CM | POA: Diagnosis present

## 2023-07-19 DIAGNOSIS — M549 Dorsalgia, unspecified: Secondary | ICD-10-CM | POA: Diagnosis present

## 2023-07-19 DIAGNOSIS — F32A Depression, unspecified: Secondary | ICD-10-CM | POA: Diagnosis present

## 2023-07-19 DIAGNOSIS — K732 Chronic active hepatitis, not elsewhere classified: Secondary | ICD-10-CM | POA: Diagnosis present

## 2023-07-19 DIAGNOSIS — Z6837 Body mass index (BMI) 37.0-37.9, adult: Secondary | ICD-10-CM | POA: Diagnosis not present

## 2023-07-19 DIAGNOSIS — G9332 Myalgic encephalomyelitis/chronic fatigue syndrome: Secondary | ICD-10-CM | POA: Diagnosis present

## 2023-07-19 DIAGNOSIS — E785 Hyperlipidemia, unspecified: Secondary | ICD-10-CM | POA: Diagnosis present

## 2023-07-19 DIAGNOSIS — M797 Fibromyalgia: Secondary | ICD-10-CM | POA: Diagnosis present

## 2023-07-19 DIAGNOSIS — E119 Type 2 diabetes mellitus without complications: Secondary | ICD-10-CM | POA: Diagnosis present

## 2023-07-19 DIAGNOSIS — E861 Hypovolemia: Secondary | ICD-10-CM | POA: Diagnosis present

## 2023-07-19 DIAGNOSIS — E6609 Other obesity due to excess calories: Secondary | ICD-10-CM | POA: Diagnosis present

## 2023-07-19 DIAGNOSIS — F419 Anxiety disorder, unspecified: Secondary | ICD-10-CM | POA: Diagnosis present

## 2023-07-19 DIAGNOSIS — Z888 Allergy status to other drugs, medicaments and biological substances status: Secondary | ICD-10-CM | POA: Diagnosis not present

## 2023-07-19 DIAGNOSIS — Z87891 Personal history of nicotine dependence: Secondary | ICD-10-CM | POA: Diagnosis not present

## 2023-07-19 DIAGNOSIS — K760 Fatty (change of) liver, not elsewhere classified: Secondary | ICD-10-CM | POA: Diagnosis present

## 2023-07-19 DIAGNOSIS — E559 Vitamin D deficiency, unspecified: Secondary | ICD-10-CM | POA: Diagnosis present

## 2023-07-19 DIAGNOSIS — Z7985 Long-term (current) use of injectable non-insulin antidiabetic drugs: Secondary | ICD-10-CM | POA: Diagnosis not present

## 2023-07-19 DIAGNOSIS — I10 Essential (primary) hypertension: Secondary | ICD-10-CM | POA: Diagnosis present

## 2023-07-19 DIAGNOSIS — G2581 Restless legs syndrome: Secondary | ICD-10-CM | POA: Diagnosis present

## 2023-07-19 DIAGNOSIS — G8929 Other chronic pain: Secondary | ICD-10-CM | POA: Diagnosis present

## 2023-07-19 LAB — BASIC METABOLIC PANEL
Anion gap: 11 (ref 5–15)
Anion gap: 7 (ref 5–15)
BUN: 10 mg/dL (ref 6–20)
BUN: 7 mg/dL (ref 6–20)
CO2: 20 mmol/L — ABNORMAL LOW (ref 22–32)
CO2: 23 mmol/L (ref 22–32)
Calcium: 8.1 mg/dL — ABNORMAL LOW (ref 8.9–10.3)
Calcium: 8 mg/dL — ABNORMAL LOW (ref 8.9–10.3)
Chloride: 94 mmol/L — ABNORMAL LOW (ref 98–111)
Chloride: 99 mmol/L (ref 98–111)
Creatinine, Ser: 0.53 mg/dL (ref 0.44–1.00)
Creatinine, Ser: 0.58 mg/dL (ref 0.44–1.00)
GFR, Estimated: >60 mL/min (ref >60)
GFR, Estimated: >60 mL/min (ref >60)
Glucose, Bld: 99 mg/dL (ref 70–99)
Glucose, Bld: 117 mg/dL — ABNORMAL HIGH (ref 70–99)
Potassium: 3.7 mmol/L (ref 3.5–5.1)
Potassium: 3.6 mmol/L (ref 3.5–5.1)
Sodium: 125 mmol/L — ABNORMAL LOW (ref 135–145)
Sodium: 129 mmol/L — ABNORMAL LOW (ref 135–145)

## 2023-07-19 LAB — CBC WITH DIFFERENTIAL/PLATELET
Abs Immature Granulocytes: 0.06 10*3/uL (ref 0.00–0.07)
Basophils Absolute: 0.1 10*3/uL (ref 0.0–0.1)
Basophils Relative: 1 %
Eosinophils Absolute: 0.2 10*3/uL (ref 0.0–0.5)
Eosinophils Relative: 2 %
HCT: 37.4 % (ref 36.0–46.0)
Hemoglobin: 12.5 g/dL (ref 12.0–15.0)
Immature Granulocytes: 1 %
Lymphocytes Relative: 28 %
Lymphs Abs: 2.5 10*3/uL (ref 0.7–4.0)
MCH: 28.7 pg (ref 26.0–34.0)
MCHC: 33.4 g/dL (ref 30.0–36.0)
MCV: 86 fL (ref 80.0–100.0)
Monocytes Absolute: 0.9 10*3/uL (ref 0.1–1.0)
Monocytes Relative: 10 %
Neutro Abs: 5.3 10*3/uL (ref 1.7–7.7)
Neutrophils Relative %: 58 %
Platelets: 272 10*3/uL (ref 150–400)
RBC: 4.35 MIL/uL (ref 3.87–5.11)
RDW: 12.9 % (ref 11.5–15.5)
WBC: 9 10*3/uL (ref 4.0–10.5)
nRBC: 0 % (ref 0.0–0.2)

## 2023-07-19 LAB — SODIUM, URINE, RANDOM: Sodium, Ur: 26 mmol/L

## 2023-07-19 MED ORDER — BREXPIPRAZOLE 1 MG PO TABS
4.0000 mg | ORAL_TABLET | Freq: Every day | ORAL | Status: DC
  Administered 2023-07-19 – 2023-07-20 (×2): 4 mg via ORAL
  Filled 2023-07-19 (×2): qty 4

## 2023-07-19 MED ORDER — ESZOPICLONE 2 MG PO TABS: 2.0000 mg | ORAL_TABLET | Freq: Every evening | ORAL | Status: DC | PRN

## 2023-07-19 MED ORDER — ONDANSETRON HCL 4 MG PO TABS: 4.0000 mg | ORAL_TABLET | Freq: Four times a day (QID) | ORAL | Status: DC | PRN

## 2023-07-19 MED ORDER — OXCARBAZEPINE 300 MG PO TABS: 600.0000 mg | ORAL_TABLET | Freq: Two times a day (BID) | ORAL | Status: DC

## 2023-07-19 MED ORDER — ALBUTEROL SULFATE (2.5 MG/3ML) 0.083% IN NEBU: 2.5000 mg | INHALATION_SOLUTION | RESPIRATORY_TRACT | Status: DC | PRN

## 2023-07-19 MED ORDER — SENNOSIDES-DOCUSATE SODIUM 8.6-50 MG PO TABS: 1.0000 | ORAL_TABLET | Freq: Every evening | ORAL | Status: DC | PRN

## 2023-07-19 MED ORDER — LORAZEPAM 0.5 MG PO TABS
0.5000 mg | ORAL_TABLET | Freq: Every day | ORAL | Status: DC | PRN
  Administered 2023-07-19: 0.5 mg via ORAL
  Filled 2023-07-19: qty 1

## 2023-07-19 MED ORDER — HYDROXYZINE HCL 10 MG PO TABS: 20.0000 mg | ORAL_TABLET | Freq: Every day | ORAL | Status: DC | PRN

## 2023-07-19 MED ORDER — LORATADINE 10 MG PO TABS
10.0000 mg | ORAL_TABLET | Freq: Every day | ORAL | Status: DC
  Administered 2023-07-19 – 2023-07-20 (×2): 10 mg via ORAL
  Filled 2023-07-19 (×2): qty 1

## 2023-07-19 MED ORDER — SODIUM CHLORIDE 1 G PO TABS
1.0000 g | ORAL_TABLET | Freq: Two times a day (BID) | ORAL | Status: DC
  Administered 2023-07-19 – 2023-07-20 (×2): 1 g via ORAL
  Filled 2023-07-19 (×2): qty 1

## 2023-07-19 MED ORDER — ONDANSETRON HCL 4 MG/2ML IJ SOLN: 4.0000 mg | Freq: Four times a day (QID) | INTRAMUSCULAR | Status: DC | PRN

## 2023-07-19 MED ORDER — AZELASTINE HCL 0.1 % NA SOLN
2.0000 | Freq: Two times a day (BID) | NASAL | Status: DC
  Administered 2023-07-19 – 2023-07-20 (×2): 2 via NASAL
  Filled 2023-07-19: qty 30

## 2023-07-19 MED ORDER — OXCARBAZEPINE 300 MG PO TABS
600.0000 mg | ORAL_TABLET | Freq: Two times a day (BID) | ORAL | Status: DC
  Administered 2023-07-19 – 2023-07-20 (×2): 600 mg via ORAL
  Filled 2023-07-19 (×2): qty 2

## 2023-07-19 MED ORDER — ACETAMINOPHEN 325 MG PO TABS
650.0000 mg | ORAL_TABLET | Freq: Four times a day (QID) | ORAL | Status: DC | PRN
  Administered 2023-07-19 (×2): 650 mg via ORAL
  Filled 2023-07-19 (×2): qty 2

## 2023-07-19 MED ORDER — SODIUM CHLORIDE 0.9 % IV SOLN: INTRAVENOUS | Status: DC

## 2023-07-19 MED ORDER — ACETAMINOPHEN 650 MG RE SUPP: 650.0000 mg | Freq: Four times a day (QID) | RECTAL | Status: DC | PRN

## 2023-07-19 MED ORDER — CARVEDILOL 12.5 MG PO TABS
12.5000 mg | ORAL_TABLET | Freq: Two times a day (BID) | ORAL | Status: DC
  Administered 2023-07-19 – 2023-07-20 (×3): 12.5 mg via ORAL
  Filled 2023-07-19 (×3): qty 1

## 2023-07-19 MED ORDER — METOPROLOL TARTRATE 5 MG/5ML IV SOLN: 5.0000 mg | INTRAVENOUS | Status: DC | PRN

## 2023-07-19 MED ORDER — ZOLPIDEM TARTRATE 5 MG PO TABS: 5.0000 mg | ORAL_TABLET | Freq: Every evening | ORAL | Status: DC | PRN

## 2023-07-19 MED ORDER — ENOXAPARIN SODIUM 40 MG/0.4ML IJ SOSY
40.0000 mg | PREFILLED_SYRINGE | INTRAMUSCULAR | Status: DC
  Administered 2023-07-19 – 2023-07-20 (×2): 40 mg via SUBCUTANEOUS
  Filled 2023-07-19 (×2): qty 0.4

## 2023-07-19 MED ORDER — CARVEDILOL 25 MG PO TABS: 25.0000 mg | ORAL_TABLET | Freq: Two times a day (BID) | ORAL | Status: DC

## 2023-07-19 MED ORDER — ALBUTEROL SULFATE HFA 108 (90 BASE) MCG/ACT IN AERS: 2.0000 | INHALATION_SPRAY | RESPIRATORY_TRACT | Status: DC | PRN

## 2023-07-19 NOTE — Telephone Encounter (Signed)
Front office staff notified to cancel pt appointment

## 2023-07-19 NOTE — H&P (Addendum)
PCP:   Gloria Saint, MD   Chief Complaint:  Dizziness  HPI: [name] with PMHx of T2DM, HTN, HLD, OSA on CPAP, fibromyalgia, alcohol abuse in remission, anxiety and depression, chronic pain, GERD.  Patient was recently admitted 8/17 to 8/18 with hyponatremia, corrected with IV fluids.  At discharge patient's sodium 129.  She saw her PCP who discontinued olmesartan as possible cause of hyponatremia. Coreg 6.25 mg twice daily substituted.  Today patient was standing talking to a coworker.  She felt some lightheaded and sat down.  She went home and blood pressure check showed SBP>200.  She denies headache, nausea, vomiting, burning urination, fever, chills.  She states for short while she had problems with no words together.  She went to [facility].  In the ER patient's BP 162/83, sodium 124.  Admission requested.  Urine sodium 26, urine osmolality 415, serum osmolality 260.  Review of Systems:  Per HPI  Past 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   DDD (degenerative disc disease), lumbar    Depression    Diabetes (HCC)    Diverticulosis    Drug use    Elevated LFTs    Fibromyalgia    GERD (gastroesophageal reflux disease)    History of ETOH abuse    Hyperlipidemia    Hyperplastic colon polyp    Internal hemorrhoids    Migraines    NAFLD (nonalcoholic fatty liver disease)    Obese    Obstructive sleep apnea 02/25/2015   not diagnosed per pt 04-03-20   Osteoarthritis    lower back- DDD   Periodic limb movement disorder 02/25/2014   Pre-diabetes    pt taking metformin   RLS (restless legs syndrome)    Sleep apnea     Smoker    Snoring    Substance abuse (HCC)    Swallowing difficulty    Vitamin B12 deficiency    Vitamin D deficiency    Past Surgical History:  Procedure Laterality Date   CERVICAL FUSION     COLONOSCOPY     CYSTOSCOPY  06/06/2013   Procedure: CYSTOSCOPY;  Surgeon: Ok Edwards, MD;  Location: [facility];  Service: Gynecology;;   EXCISION METACARPAL MASS Right 09/03/2021   Procedure: EXCISION METACARPAL MASS RIGHT SMALL FINGER;  Surgeon: Cindee Salt, MD;  Location: [facility];  Service: Orthopedics;  Laterality: Right;   INTRAUTERINE DEVICE INSERTION  08/09/2008   PARAGUARD- removed   kidney surgery  1995   abcess   LAPAROSCOPY N/A 06/06/2013   Procedure: LAPAROSCOPY DIAGNOSTIC;  Surgeon: Ok Edwards, MD;  Location: [facility];  Service: Gynecology;  Laterality: N/A;   LAPAROTOMY  06/06/2013   Procedure: EXPLORATORY LAPAROTOMY;  Surgeon: Ok Edwards, MD;  Location: [facility];  Service: Gynecology;;   LIVER BIOPSY     radial tunnel Bilateral 2005   VAGINAL HYSTERECTOMY Left 06/06/2013   Procedure: Vaginal Hysterectomy with Patiial Right Salpingectomy;  Surgeon: Ok Edwards, MD;  Location: [facility];  Service: Gynecology;  Laterality: Left;    Medications: Prior to Admission medications   Medication Sig Start Date End Date Taking? Authorizing Provider  amoxicillin-clavulanate (AUGMENTIN) 500-125 MG tablet Take 1 tablet by mouth in the morning and at bedtime for 7 days. 07/13/23 07/20/23  Gloria Saint, MD  azelastine (ASTELIN) 0.1 % nasal spray Place 2 sprays into both nostrils 2 (two) times daily. 09/10/22   Ardith Dark, MD  B Complex Vitamins (B COMPLEX PO) Take 1 tablet by mouth daily.    [provider]  blood glucose meter kit and supplies KIT Dispense based on patient and insurance preference. Use up to four times daily as directed. (FOR ICD-9 250.00, 250.01). 04/11/19   Gloria Saint, MD  Blood Glucose Monitoring Suppl (ONETOUCH VERIO FLEX  SYSTEM) w/Device KIT Use to check Blood sugars twice daily before meals 04/12/19   Gloria Saint, MD  Brexpiprazole (REXULTI) 4 MG TABS Take by mouth.    [provider]  Calcium Carbonate-Vitamin D 600-400 MG-UNIT tablet Take 1 tablet by mouth daily.    [provider]  carvedilol (COREG) 6.25 MG tablet Take 1 tablet (6.25 mg total) by mouth 2 (two) times daily with a meal. 07/11/23   Gloria Saint, MD  clotrimazole-betamethasone (LOTRISONE) cream Apply 1 application topically 2 (two) times daily. For 10 days maximum 06/09/19   Shelva Majestic, MD  Coenzyme Q10 (COQ10) 100 MG CAPS Take 100 mg by mouth daily.    [provider]  diclofenac (VOLTAREN) 75 MG EC tablet Take 75 mg by mouth 2 (two) times daily. 10/21/21   [provider]  estradiol (VIVELLE-DOT) 0.05 MG/24HR patch Place 1 patch (0.05 mg total) onto the skin 2 (two) times a week. 11/11/22   Genia Del, MD  fluticasone (CUTIVATE) 0.05 % cream Apply topically 2 (two) times daily.    [provider]  glucose blood (CONTOUR NEXT TEST) test strip Use to test blood glucose twice daily 01/11/20   Gloria Saint, MD  hydrOXYzine (ATARAX) 10 MG tablet Take 20 mg by mouth daily as needed. 11/23/22   [provider]  ibuprofen (ADVIL) 800 MG tablet TAKE 1 TABLET(800 MG) BY MOUTH EVERY 8 HOURS AS NEEDED FOR MODERATE PAIN 03/25/21   Gloria Saint, MD  ketoconazole (NIZORAL) 2 % cream Apply 1 application topically daily.    [provider]  Lancets (ONETOUCH DELICA PLUS LANCET30G) MISC USE 1  TO CHECK GLUCOSE 4 TIMES DAILY 10/11/19   Gloria Saint, MD  loratadine (CLARITIN) 10 MG tablet Take 10 mg by mouth daily.    [provider]  LORazepam (ATIVAN) 0.5 MG tablet Take 0.5 mg by mouth daily. 10/22/21   [provider]  Magnesium 500 MG CAPS Take by mouth.    [provider]  metroNIDAZOLE (METROCREAM) 0.75 % cream daily 11/21/19   [provider]  Multiple Vitamins-Minerals (MULTIVITAMIN PO) Take 1 tablet by mouth daily.     [provider]  OMEGA 3-6-9 FATTY ACIDS PO Take 500 mg by mouth daily.    [provider]  oxcarbazepine (TRILEPTAL) 600 MG tablet Take 600 mg by mouth 2 (two) times daily. 10/22/21   [provider]  pantoprazole (PROTONIX) 40 MG tablet TAKE 1 TABLET BY MOUTH DAILY Patient taking differently: as needed. 01/12/21   Gloria Saint, MD  rizatriptan (MAXALT) 10 MG tablet TAKE 1 TABLET BY MOUTH AS NEEDED FOR MIGRAINE 03/03/23   Gloria Saint, MD  thiamine (VITAMIN B-1) 50 MG tablet Take 50 mg by mouth daily.    [provider]  tirzepatide Greggory Keen) 15 MG/0.5ML Pen Inject 15 mg into the skin once a week. 06/21/23   Quillian Quince D, MD  vitamin C (ASCORBIC ACID) 500 MG tablet Take 500 mg by mouth daily.    [provider]  VITAMIN E PO Take 900 mg by mouth daily.    [provider]    Allergies:   Allergies  Allergen Reactions   Benadryl [Diphenhydramine Hcl] Hives   Ropinirole Other (See Comments)   Levofloxacin Other (See Comments)   Aspirin    Codeine    Doxycycline    Lisinopril-Hydrochlorothiazide Other (See Comments)    Dry cough, dry eyes, insomnia, emotionally liable, increased thirst, brain fog, fatigue.   Metformin And Related     Blurred vision   Nuvigil [Armodafinil]     migraines    Olmesartan Other (See Comments)    May have contributed to hyponatremia.  Baseline sodium 131-133.   Requip [Ropinirole Hcl]     Increased headache   Semaglutide Other (See Comments)    Rybelsus caused depression and fibromyalgia flair.   Tramadol     Other reaction(s): Other (See Comments)   Wellbutrin [Bupropion]    Zolpidem     Social History:  reports that she quit smoking about 11 years ago. Her smoking use included cigarettes. She has never used smokeless tobacco. She reports that she does not drink alcohol and does not use  drugs.  Family History: Family History  Problem Relation Age of Onset   Cancer Mother    Breast cancer Mother    Depression Mother    Anxiety disorder Mother    Obesity Mother    Sleep apnea Mother    Obesity Father    Anxiety disorder Father    Cancer Father    Hyperlipidemia Father    Colon polyps Father    Diabetes Father    Cirrhosis Father    Liver cancer Father    Liver disease Father    Alcoholism Father    Depression Father    Alcohol abuse Father    Sleep apnea Father    Irritable bowel syndrome Sister    Sleep apnea Brother    Breast cancer Maternal Aunt    Alcoholism Maternal Aunt    Alcoholism Maternal Grandmother    Alcoholism Maternal Grandfather    Breast cancer Paternal Grandmother    Colon cancer Paternal Grandmother    Esophageal cancer Paternal Grandfather    Stomach cancer Paternal Grandfather    Alcoholism Paternal Grandfather    Rectal cancer Neg Hx     Physical Exam: Vitals:   07/18/23 1800 07/18/23 1815 07/18/23 2141 07/19/23 0156  BP: (!) 158/86 (!) 161/76  (!) 156/65  Pulse: 64 71  68  Resp:  18  14  Temp:   98.1 F (36.7 C) 98.3 F (36.8 C)  TempSrc:    Oral  SpO2: 100% 100%  99%  Weight:    107.9 kg  Height:        General: A and O x 3, well developed and nourished, no acute distress Eyes: PERRLA, pink conjunctiva, no scleral icterus ENT: Moist oral mucosa, neck supple, no thyromegaly Lungs: CTA B/L, no wheeze, no crackles, no use of accessory muscles Cardiovascular: RRR, no regurgitation, no gallops, no murmurs. No carotid bruits, no JVD Abdomen: soft, positive BS, NTND, no organomegaly, not an acute abdomen GU: not  examined Neuro: CN II - XII grossly intact, sensation intact Musculoskeletal: strength 5/5 all extremities.  No edema Skin: no rash, no subcutaneous crepitation, no decubitus Psych: appropriate patient   Labs on Admission:  Recent Labs    07/18/23 1631  NA 124*  K 4.1  CL 91*  CO2 26  GLUCOSE 79  BUN  10  CREATININE 0.64  CALCIUM 9.4    Recent Labs    07/18/23 1631  WBC 10.4  HGB 13.4  HCT 39.7  MCV 85.4  PLT 312    Radiological Exams on Admission: No results found.  Assessment/Plan Present on Admission:  Hypovolemic hyponatremic  -Gentle IV fluid hydration with NS -BMP every 6 hours. -Unclear etiology of recurrent hyponatremia.  Trileptal causes hyponatremia in 1 to 3% of patients.  Patient has been on this for over 6 years.  This has been held.   HTN -Coreg 12.5 mg p.o. twice daily.  Coreg increased.  First dose now   OSA -CPAP ordered   Depression with anxiety //  Migraine -Trileptal on hold -Tylenol as needed  Gloria [name] 07/19/2023, 2:56 AM

## 2023-07-19 NOTE — Plan of Care (Signed)
  Problem: Health Behavior/Discharge Planning: Goal: Ability to manage health-related needs will improve Outcome: Progressing   Problem: Clinical Measurements: Goal: Ability to maintain clinical measurements within normal limits will improve Outcome: Progressing Goal: Will remain free from infection Outcome: Progressing   

## 2023-07-19 NOTE — Progress Notes (Signed)
   07/19/23 2035  BiPAP/CPAP/SIPAP  BiPAP/CPAP/SIPAP Pt Type Adult (Patient using her home machine and equipment.  Patient prefers self placement when ready for bed.)  BiPAP/CPAP/SIPAP Resmed  Mask Type Full face mask  Heater Temperature  (sterile water added to humidifier)  Patient Home Equipment Yes  Safety Check Completed by RT for Home Unit Yes, no issues noted

## 2023-07-19 NOTE — Progress Notes (Addendum)
Progress Note   Patient: Gloria [name] GEX:[id number] DOB: 1971-04-13 DOA: 07/18/2023     0 DOS: the patient was seen and examined on 07/19/2023   Brief hospital course: 52yo with h/o T2DM, HTN, HLD, OSA on CPAP, fibromyalgia, class 2 obesity, chronic pain, and ETOH use d/o in remission who presented on 8/26 with persistent dizziness.  She was previously admitted from 8/17-18 with hyponatremia; hydrochlorothiazide was discontinued and she was given gentle IVF hydration.  She followed with her PCP on 8/19 and 8/21.  Assessment and Plan:  Euvolemic hyponatremia Patient was previously admitted for hyponatremia, thought to be hypovolemic in nature and related to hydrochlorothiazide (possibly in conjunction with Trileptal) After dc her Na++ was stable x 2 but again decreased and she returned She drinks water but not excessively so psychogenic polydipsia seems unlikely Urinary Osm and sodium appear normal Unclear etiology of recurrent hyponatremia.  Trileptal causes hyponatremia in 1 to 3% of patients but she has been on this for over 6 years so is unlikely the cause Will add salt tabs BID, hold IVF, and recheck labs in AM  HTN Takes carvedilol 6.25 BID at home, increased to 12.5 on admission   OSA Continue CPAP   Depression with anxiety Resume Trileptal - she has been on this for years without issues with hyponatremia until now and it works very well for her refractory depression Continue Rexulti, Lunesta, hydroxyzine/lorazepam prn  DM Diet controlled Recent A1c was 5.2 No current treatment needed  Obesity Body mass index is 38.39 kg/m.[name]  Weight loss should be encouraged Outpatient PCP/bariatric medicine/bariatric surgery f/u encouraged  Hold Mounjaro - probably unrelated but this class of medications is associated with frequent side effects   Consultants: None  Procedures: None  Antibiotics: None      Subjective: Feeling better, frustrated with recurrent issues.      Objective: Vitals:   07/19/23 1009 07/19/23 1434  BP: 127/67 137/71  Pulse: 72 69  Resp: 16 16  Temp: 97.7 F (36.5 C) 98 F (36.7 C)  SpO2: 97% 100%    Intake/Output Summary (Last 24 hours) at 07/19/2023 1718 Last data filed at 07/19/2023 1510 Gross per 24 hour  Intake 1691.52 ml  Output --  Net 1691.52 ml   Filed Weights   07/18/23 1602 07/19/23 0156  Weight: 105 kg 107.9 kg    Exam:  General:  Appears calm and comfortable and is in NAD Eyes:   EOMI, normal lids, iris ENT:  grossly normal hearing, lips & tongue, mmm Neck:  no LAD, masses or thyromegaly Cardiovascular:  RRR, no m/r/g. No LE edema.  Respiratory:   CTA bilaterally with no wheezes/rales/rhonchi.  Normal respiratory effort. Abdomen:  soft, NT, ND Skin:  no rash or induration seen on limited exam Musculoskeletal:  grossly normal tone BUE/BLE, good ROM, no bony abnormality Psychiatric:  grossly normal mood and affect, speech fluent and appropriate, AOx3 Neurologic:  CN 2-12 grossly intact, moves all extremities in coordinated fashion  Data Reviewed: I have reviewed the patient's lab results since admission.  Pertinent labs for today include:  Na++ 125, 124 on 8/26, 129 on 8/21 Normal CBC Normal UA UOsm 415 UNa 26    Family Communication: None present  Disposition: Status is: Inpatient Admit - It is my clinical opinion that admission to INPATIENT is reasonable and necessary because of the expectation that this patient will require hospital care that crosses at least 2 midnights to treat this condition based on the medical complexity of the  problems presented.  Given the aforementioned information, the predictability of an adverse outcome is felt to be significant.   Planned Discharge Destination: Home    No charge - admitted today  Author: Jonah Blue, MD 07/19/2023 5:18 PM  For on call review [email or web address].

## 2023-07-20 ENCOUNTER — Other Ambulatory Visit (HOSPITAL_COMMUNITY): Payer: Self-pay

## 2023-07-20 DIAGNOSIS — E871 Hypo-osmolality and hyponatremia: Secondary | ICD-10-CM | POA: Diagnosis not present

## 2023-07-20 LAB — CBC WITH DIFFERENTIAL/PLATELET
Abs Immature Granulocytes: 0.08 10*3/uL — ABNORMAL HIGH (ref 0.00–0.07)
Basophils Absolute: 0.1 10*3/uL (ref 0.0–0.1)
Basophils Relative: 1 %
Eosinophils Absolute: 0.2 10*3/uL (ref 0.0–0.5)
Eosinophils Relative: 2 %
HCT: 38.5 % (ref 36.0–46.0)
Hemoglobin: 12.7 g/dL (ref 12.0–15.0)
Immature Granulocytes: 1 %
Lymphocytes Relative: 27 %
Lymphs Abs: 2.6 10*3/uL (ref 0.7–4.0)
MCH: 29.5 pg (ref 26.0–34.0)
MCHC: 33 g/dL (ref 30.0–36.0)
MCV: 89.5 fL (ref 80.0–100.0)
Monocytes Absolute: 0.9 10*3/uL (ref 0.1–1.0)
Monocytes Relative: 9 %
Neutro Abs: 6 10*3/uL (ref 1.7–7.7)
Neutrophils Relative %: 60 %
Platelets: 266 10*3/uL (ref 150–400)
RBC: 4.3 MIL/uL (ref 3.87–5.11)
RDW: 13.4 % (ref 11.5–15.5)
WBC: 9.8 10*3/uL (ref 4.0–10.5)
nRBC: 0 % (ref 0.0–0.2)

## 2023-07-20 LAB — BASIC METABOLIC PANEL
Anion gap: 7 (ref 5–15)
BUN: 8 mg/dL (ref 6–20)
CO2: 21 mmol/L — ABNORMAL LOW (ref 22–32)
Calcium: 8.2 mg/dL — ABNORMAL LOW (ref 8.9–10.3)
Chloride: 104 mmol/L (ref 98–111)
Creatinine, Ser: 0.57 mg/dL (ref 0.44–1.00)
GFR, Estimated: >60 mL/min (ref >60)
Glucose, Bld: 98 mg/dL (ref 70–99)
Potassium: 3.9 mmol/L (ref 3.5–5.1)
Sodium: 132 mmol/L — ABNORMAL LOW (ref 135–145)

## 2023-07-20 MED ORDER — SODIUM CHLORIDE 1 G PO TABS
1.0000 g | ORAL_TABLET | Freq: Once | ORAL | 0 refills | Status: DC | PRN
  Filled 2023-07-20: qty 20, 20d supply, fill #0

## 2023-07-20 NOTE — Progress Notes (Signed)
Medication delivered from out pt pharmacy to pt's room, given to [facility]. Pt's primary RN, Meg, updated by this RN on med delivery

## 2023-07-20 NOTE — Progress Notes (Signed)
   07/20/23 1200  TOC Brief Assessment  Insurance and Status Reviewed  Patient has primary care physician Yes Salomon Fick, Bettey Mare, MD)  Home environment has been reviewed Yes - home  Prior level of function: Independent  Prior/Current Home Services No current home services  Social Determinants of Health Reivew SDOH reviewed no interventions necessary  Readmission risk has been reviewed Yes  Transition of care needs no transition of care needs at this time

## 2023-07-20 NOTE — Discharge Summary (Signed)
Physician Discharge Summary   Patient: Gloria [name] MRN: [id number] DOB: 04/12/1971  Admit date:     07/18/2023  Discharge date: 07/20/23  Discharge Physician: Jonah Blue   PCP: Deeann Saint, MD   Recommendations at discharge:   Salt tablets are provided for as needed use - take one if symptomatic and then go to PCP for labs to determine if additional tablets are needed Follow up for repeat BMP in 1-2 days with PCP and then as directed If sodium continues to wax and wane, consider endocrinology referral as an outpatient Continue to hold [facility] for now  Discharge Diagnoses: Principal Problem:   Hyponatremia with normal extracellular fluid volume Active Problems:   Depression with anxiety   OSA (obstructive sleep apnea)   Diabetes mellitus (HCC)   Essential hypertension   Class 2 obesity due to excess calories with body mass index (BMI) of 38.0 to 38.9 in adult    Hospital Course: 52yo with h/o T2DM, HTN, HLD, OSA on CPAP, fibromyalgia, class 2 obesity, chronic pain, and ETOH use d/o in remission who presented on 8/26 with persistent dizziness. She was previously admitted from 8/17-18 with hyponatremia; hydrochlorothiazide was discontinued and she was given gentle IVF hydration. She followed with her PCP on 8/19 and 8/21. Recurrent hyponatremia led to admission.  Mild improvement with IVF, moreso with salt tab.  Assessment and Plan:   Euvolemic hyponatremia Patient was previously admitted for hyponatremia, thought to be hypovolemic in nature and related to hydrochlorothiazide (possibly in conjunction with Trileptal) After dc her Na++ was stable x 2 but again decreased and she returned She drinks water but not excessively so psychogenic polydipsia seems unlikely Urinary Osm and sodium appear normal Unclear etiology of recurrent hyponatremia.  Trileptal causes hyponatremia in 1 to 3% of patients but she has been on this for over 6 years so is unlikely the cause Added  salt tabs BID with significant improvement Patient feels well, wants to go home Will hold further salt tabs but order for prn use (symptomatic or based on PCP labs) If she has recurrence, suggest outpatient referral to endocrinology   HTN Takes carvedilol 6.25 BID at home, increased to 12.5 on admission   OSA Continue CPAP   Depression with anxiety Resume Trileptal - she has been on this for years without issues with hyponatremia until now and it works very well for her refractory depression Continue Rexulti, Lunesta, hydroxyzine/lorazepam prn   DM Diet controlled Recent A1c was 5.2 No current treatment needed   Obesity Body mass index is 38.39 kg/m.[name]  Weight loss should be encouraged Outpatient PCP/bariatric medicine/bariatric surgery f/u encouraged  Hold Mounjaro - probably unrelated but this class of medications is associated with frequent side effects     Consultants: None   Procedures: None   Antibiotics: None     Disposition: Home Diet recommendation:  Carb modified diet DISCHARGE MEDICATION: Allergies as of 07/20/2023       Reactions   Benadryl [diphenhydramine Hcl] Hives   Ropinirole Other (See Comments)   Levofloxacin Other (See Comments)   Aspirin    Codeine    Doxycycline    Lisinopril-hydrochlorothiazide Other (See Comments)   Dry cough, dry eyes, insomnia, emotionally liable, increased thirst, brain fog, fatigue.   Metformin And Related    Blurred vision   Nuvigil [armodafinil]    migraines   Olmesartan Other (See Comments)   May have contributed to hyponatremia.  Baseline sodium 131-133.   Requip [ropinirole Hcl]  Increased headache   Semaglutide Other (See Comments)   Rybelsus caused depression and fibromyalgia flair.   Tramadol    Other reaction(s): Other (See Comments)   Wellbutrin [bupropion]    Zolpidem         Medication List     STOP taking these medications    tirzepatide 15 MG/0.5ML Pen Commonly known as: MOUNJARO        TAKE these medications    albuterol 108 (90 Base) MCG/ACT inhaler Commonly known as: VENTOLIN HFA Inhale 2 puffs into the lungs every 4 (four) hours as needed for wheezing or shortness of breath.   ascorbic acid 500 MG tablet Commonly known as: VITAMIN C Take 500 mg by mouth daily.   azelastine 0.1 % nasal spray Commonly known as: ASTELIN Place 2 sprays into both nostrils 2 (two) times daily.   B COMPLEX PO Take 1 tablet by mouth daily.   benzonatate 100 MG capsule Commonly known as: TESSALON Take 100 mg by mouth 2 (two) times daily as needed for cough.   blood glucose meter kit and supplies Kit Dispense based on patient and insurance preference. Use up to four times daily as directed. (FOR ICD-9 250.00, 250.01).   Calcium Carbonate-Vitamin D 600-400 MG-UNIT tablet Take 1 tablet by mouth daily.   carvedilol 6.25 MG tablet Commonly known as: COREG Take 1 tablet (6.25 mg total) by mouth 2 (two) times daily with a meal.   cetirizine 10 MG tablet Commonly known as: ZYRTEC Take 10 mg by mouth daily.   Contour Next Test test strip Generic drug: glucose blood Use to test blood glucose twice daily   CoQ10 100 MG Caps Take 100 mg by mouth daily.   D3-50 1.25 MG (50000 UT) capsule Generic drug: Cholecalciferol Take 50,000 Units by mouth once a week.   estradiol 0.05 MG/24HR patch Commonly known as: VIVELLE-DOT Place 1 patch (0.05 mg total) onto the skin 2 (two) times a week.   eszopiclone 2 MG Tabs tablet Commonly known as: LUNESTA Take 2 mg by mouth at bedtime.   fluticasone 0.05 % cream Commonly known as: CUTIVATE Apply 1 Application topically 2 (two) times daily as needed (Behind ear/Behind belly flap).   hydrOXYzine 10 MG tablet Commonly known as: ATARAX Take 20 mg by mouth daily as needed for anxiety.   ibuprofen 800 MG tablet Commonly known as: ADVIL TAKE 1 TABLET(800 MG) BY MOUTH EVERY 8 HOURS AS NEEDED FOR MODERATE PAIN   LORazepam 0.5 MG  tablet Commonly known as: ATIVAN Take 0.5 mg by mouth daily as needed for anxiety.   Magnesium 500 MG Caps Take 500 mg by mouth at bedtime.   MULTIVITAMIN PO Take 1 tablet by mouth daily.   OMEGA 3-6-9 FATTY ACIDS PO Take 500 mg by mouth daily.   OneTouch Delica Plus Lancet30G Misc USE 1  TO CHECK GLUCOSE 4 TIMES DAILY   OneTouch Verio Flex System w/Device Kit Use to check Blood sugars twice daily before meals   oxcarbazepine 600 MG tablet Commonly known as: TRILEPTAL Take 600 mg by mouth 2 (two) times daily.   Rexulti 4 MG Tabs Generic drug: Brexpiprazole Take 4 mg by mouth daily.   rizatriptan 10 MG tablet Commonly known as: MAXALT TAKE 1 TABLET BY MOUTH AS NEEDED FOR MIGRAINE   scopolamine 1 MG/3DAYS Commonly known as: TRANSDERM-SCOP Place 1 patch onto the skin See admin instructions. Apply one patch every 72 hours as needed for travel.   sodium chloride 1 g tablet Take  1 tablet (1 g total) by mouth once as needed for up to 1 dose (symptomatic hyponatremia (fatigue, lethargy, confusion) or as directed by physician).   thiamine 50 MG tablet Commonly known as: VITAMIN B-1 Take 50 mg by mouth daily.   VITAMIN E PO Take 900 mg by mouth daily.        Discharge Exam: Filed Weights   07/18/23 1602 07/19/23 0156  Weight: 105 kg 107.9 kg       Subjective: Feels better today, mild fatigue.  Ready to go home, discussed dc plans.   Objective: Vitals:   07/20/23 1001 07/20/23 1323  BP: (!) 155/88 127/77  Pulse: 69 66  Resp:  17  Temp:  98.1 F (36.7 C)  SpO2:  100%    Intake/Output Summary (Last 24 hours) at 07/20/2023 1357 Last data filed at 07/19/2023 1510 Gross per 24 hour  Intake 937.3 ml  Output --  Net 937.3 ml   Filed Weights   07/18/23 1602 07/19/23 0156  Weight: 105 kg 107.9 kg    Exam:  General:  Appears calm and comfortable and is in NAD Eyes:   EOMI, normal lids, iris ENT:  grossly normal hearing, lips & tongue, mmm Neck:  no  LAD, masses or thyromegaly Cardiovascular:  RRR, no m/r/g. No LE edema.  Respiratory:   CTA bilaterally with no wheezes/rales/rhonchi.  Normal respiratory effort. Abdomen:  soft, NT, ND Skin:  no rash or induration seen on limited exam Musculoskeletal:  grossly normal tone BUE/BLE, good ROM, no bony abnormality Psychiatric:  grossly normal mood and affect, speech fluent and appropriate, AOx3 Neurologic:  CN 2-12 grossly intact, moves all extremities in coordinated fashion  Data Reviewed: I have reviewed the patient's lab results since admission.  Pertinent labs for today include:  Na++ 132, improving Normal CBC    Condition at discharge: good  The results of significant diagnostics from this hospitalization (including imaging, microbiology, ancillary and laboratory) are listed below for reference.   Imaging Studies: DG Chest Portable 1 View  Result Date: 07/09/2023 CLINICAL DATA:  Cough when she lays flat.  Feels okay otherwise. EXAM: PORTABLE CHEST 1 VIEW COMPARISON:  12/31/2007 FINDINGS: Chronic blunting of the right costophrenic angle likely secondary to scarring. No focal consolidation. No pleural effusion or pneumothorax. Heart and mediastinal contours are unremarkable. No acute osseous abnormality. IMPRESSION: No active disease. Electronically Signed   By: Elige Ko M.D.   On: 07/09/2023 15:03    Microbiology: Results for orders placed or performed in visit on 10/12/19  Novel Coronavirus, NAA ([facility])     Status: None   Collection Time: 10/12/19 12:00 AM   Specimen: Nasopharyngeal(NP) swabs in vial transport medium   NASOPHARYNGE  TESTING  Result Value Ref Range Status   SARS-CoV-2, NAA Not Detected Not Detected Final    Comment: This nucleic acid amplification test was developed and its performance characteristics determined by [employer]. Nucleic acid amplification tests include PCR and TMA. This test has not been FDA cleared or approved. This test has been  authorized by FDA under an Emergency Use Authorization (EUA). This test is only authorized for the duration of time the declaration that circumstances exist justifying the authorization of the emergency use of in vitro diagnostic tests for detection of SARS-CoV-2 virus and/or diagnosis of COVID-19 infection under section 564(b)(1) of the Act, 21 U.S.C. 756EPP-2(R) (1), unless the authorization is terminated or revoked sooner. When diagnostic testing is negative, the possibility of a false negative result should be  considered in the context of a patient's recent exposures and the presence of clinical signs and symptoms consistent with COVID-19. An individual without symptoms of COVID-19 and who is not shedding SARS-CoV-2 virus would  expect to have a negative (not detected) result in this assay.     Labs: CBC: Recent Labs  Lab 07/18/23 1631 07/19/23 0608 07/20/23 0553  WBC 10.4 9.0 9.8  NEUTROABS  --  5.3 6.0  HGB 13.4 12.5 12.7  HCT 39.7 37.4 38.5  MCV 85.4 86.0 89.5  PLT 312 272 266   Basic Metabolic Panel: Recent Labs  Lab 07/18/23 1631 07/19/23 0608 07/19/23 1051 07/20/23 0553  NA 124* 125* 129* 132*  K 4.1 3.7 3.6 3.9  CL 91* 94* 99 104  CO2 26 20* 23 21*  GLUCOSE 79 99 117* 98  BUN 10 10 7 8   CREATININE 0.64 0.53 0.58 0.57  CALCIUM 9.4 8.1* 8.0* 8.2*   Liver Function Tests: No results for input(s): "AST", "ALT", "ALKPHOS", "BILITOT", "PROT", "ALBUMIN" in the last 168 hours.  CBG: No results for input(s): "GLUCAP" in the last 168 hours.  Discharge time spent: greater than 30 minutes.  Signed: Jonah Blue, MD [facility] 07/20/2023

## 2023-07-20 NOTE — Plan of Care (Signed)

## 2023-07-20 NOTE — Hospital Course (Addendum)
52yo with h/o T2DM, HTN, HLD, OSA on CPAP, fibromyalgia, class 2 obesity, chronic pain, and ETOH use d/o in remission who presented on 8/26 with persistent dizziness. She was previously admitted from 8/17-18 with hyponatremia; hydrochlorothiazide was discontinued and she was given gentle IVF hydration. She followed with her PCP on 8/19 and 8/21. Recurrent hyponatremia led to admission.  Mild improvement with IVF, moreso with salt tab.

## 2023-07-21 ENCOUNTER — Encounter: Payer: Self-pay | Admitting: Family Medicine

## 2023-07-21 ENCOUNTER — Ambulatory Visit: Payer: 59 | Admitting: Family Medicine

## 2023-07-21 VITALS — BP 106/78 | HR 77 | Temp 98.6°F | Ht 66.0 in | Wt 231.8 lb

## 2023-07-21 DIAGNOSIS — F411 Generalized anxiety disorder: Secondary | ICD-10-CM

## 2023-07-21 DIAGNOSIS — F339 Major depressive disorder, recurrent, unspecified: Secondary | ICD-10-CM | POA: Diagnosis not present

## 2023-07-21 DIAGNOSIS — I10 Essential (primary) hypertension: Secondary | ICD-10-CM

## 2023-07-21 DIAGNOSIS — E871 Hypo-osmolality and hyponatremia: Secondary | ICD-10-CM

## 2023-07-21 DIAGNOSIS — N189 Chronic kidney disease, unspecified: Secondary | ICD-10-CM | POA: Insufficient documentation

## 2023-07-21 NOTE — Telephone Encounter (Signed)
FYI Pt appointment was cancelled

## 2023-07-21 NOTE — Progress Notes (Signed)
Established Patient Office Visit   Subjective  Patient ID: Gloria [name], female    DOB: 1971-09-18  Age: 52 y.o. MRN: [id number]  Chief Complaint  Patient presents with   Hospitalization Follow-up    Sodium, feeling better    HPI  Patient is a 52 year old female seen for HFU.  Patient hospitalized 8/26-8/28 for hyponatremia with normal extracellular fluid volume.  Patient previously admitted for similar symptoms.  Thought 2/2 HCTZ, but symptoms continued after medication was discontinued.  Urine osmolality and sodium appear normal.  Unclear etiology of recurrent hyponatremia.  Possibly 2/2 Trileptal but patient has been stable on this medication for years.  Patient given salt tablets during hospitalization and advised to use as needed at time of discharge.  Endocrinology referral also suggested for continued symptoms.  Carvedilol increased from 6.25 twice daily to 12.5 mg.  Patient states she is feeling better since discharge.  Cannot recall any further changes that may have contributed to symptoms.  Patient was previously on lorazepam for years to help with sleep but changed to Lunesta 1 month ago.  Patient states medications not helping with sleep.  Took Sudafed a few times after first hospital visit due to sinus pressure.  Has follow-up appointment with psychiatry next week.   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   DDD (degenerative disc disease), lumbar    Depression    Diabetes (HCC)    Diverticulosis    Drug use    Elevated LFTs    Fibromyalgia    GERD (gastroesophageal reflux disease)    History of ETOH abuse    Hyperlipidemia    Hyperplastic colon  polyp    Internal hemorrhoids    Migraines    NAFLD (nonalcoholic fatty liver disease)    Obese    Obstructive sleep apnea 02/25/2015   not diagnosed per pt 04-03-20   Osteoarthritis    lower back- DDD   Periodic limb movement disorder 02/25/2014   Pre-diabetes    pt taking metformin   RLS (restless legs syndrome)    Sleep apnea    Smoker    Snoring    Substance abuse (HCC)    Swallowing difficulty    Vitamin B12 deficiency    Vitamin D deficiency    Past Surgical History:  Procedure Laterality Date   CERVICAL FUSION     COLONOSCOPY     CYSTOSCOPY  06/06/2013   Procedure: CYSTOSCOPY;  Surgeon: Ok Edwards, MD;  Location: [facility];  Service: Gynecology;;   EXCISION METACARPAL MASS Right 09/03/2021   Procedure: EXCISION METACARPAL MASS RIGHT SMALL FINGER;  Surgeon: Cindee Salt, MD;  Location: [facility];  Service: Orthopedics;  Laterality: Right;   INTRAUTERINE DEVICE INSERTION  08/09/2008   PARAGUARD- removed   kidney surgery  1995   abcess   LAPAROSCOPY N/A 06/06/2013   Procedure: LAPAROSCOPY DIAGNOSTIC;  Surgeon: Ok Edwards, MD;  Location: [facility];  Service: Gynecology;  Laterality: N/A;   LAPAROTOMY  06/06/2013   Procedure: EXPLORATORY LAPAROTOMY;  Surgeon:  Ok Edwards, MD;  Location: [facility];  Service: Gynecology;;   LIVER BIOPSY     partial right kideny  Right    radial tunnel Bilateral 2005   VAGINAL HYSTERECTOMY Left 06/06/2013   Procedure: Vaginal Hysterectomy with Patiial Right Salpingectomy;  Surgeon: Ok Edwards, MD;  Location: [facility];  Service: Gynecology;  Laterality: Left;   Social History   Tobacco Use   Smoking status: Former    Current packs/day: 0.00    Types: Cigarettes    Quit date: 01/26/2012    Years since quitting: 11.4   Smokeless tobacco: Never  Vaping Use   Vaping status: Never Used  Substance Use Topics   Alcohol use: No    Alcohol/week: 0.0 standard drinks of alcohol    Comment: History of alcohol abuse    Drug use: No   Family History  Problem Relation Age of Onset   Cancer Mother    Breast cancer Mother    Depression Mother    Anxiety disorder Mother    Obesity Mother    Sleep apnea Mother    Obesity Father    Anxiety disorder Father    Cancer Father    Hyperlipidemia Father    Colon polyps Father    Diabetes Father    Cirrhosis Father    Liver cancer Father    Liver disease Father    Alcoholism Father    Depression Father    Alcohol abuse Father    Sleep apnea Father    Irritable bowel syndrome Sister    Sleep apnea Brother    Breast cancer Maternal Aunt    Alcoholism Maternal Aunt    Alcoholism Maternal Grandmother    Alcoholism Maternal Grandfather    Breast cancer Paternal Grandmother    Colon cancer Paternal Grandmother    Esophageal cancer Paternal Grandfather    Stomach cancer Paternal Grandfather    Alcoholism Paternal Grandfather    Rectal cancer Neg Hx    Allergies  Allergen Reactions   Benadryl [Diphenhydramine Hcl] Hives   Ropinirole Other (See Comments)   Levofloxacin Other (See Comments)   Aspirin    Codeine    Doxycycline    Lisinopril-Hydrochlorothiazide Other (See Comments)    Dry cough, dry eyes, insomnia, emotionally liable, increased thirst, brain fog, fatigue.   Metformin And Related     Blurred vision   Nuvigil [Armodafinil]     migraines    Olmesartan Other (See Comments)    May have contributed to hyponatremia.  Baseline sodium 131-133.   Requip [Ropinirole Hcl]     Increased headache   Semaglutide Other (See Comments)    Rybelsus caused depression and fibromyalgia flair.   Tramadol     Other reaction(s): Other (See Comments)   Wellbutrin [Bupropion]    Zolpidem       ROS Negative unless stated above    Objective:     BP 106/78 (BP Location: Left Wrist, Patient Position: Sitting, Cuff Size: Normal)   Pulse 77   Temp 98.6 F (37 C) (Oral)   Ht 5\' 6"  (1.676 m)   Wt 231 lb 12.8 oz (105.1 kg)   LMP 04/25/2013   SpO2  97%   BMI 37.41 kg/m  BP Readings from Last 3 Encounters:  07/21/23 106/78  07/20/23 127/77  07/13/23 110/70   Wt Readings from Last 3 Encounters:  07/21/23 231 lb 12.8 oz (105.1 kg)  07/19/23 237 lb 14 oz (107.9 kg)  07/13/23 231 lb 6.4 oz (105  kg)      Physical Exam Constitutional:      General: She is not in acute distress.    Appearance: Normal appearance.  HENT:     Head: Normocephalic and atraumatic.     Nose: Nose normal.     Mouth/Throat:     Mouth: Mucous membranes are moist.  Cardiovascular:     Rate and Rhythm: Normal rate and regular rhythm.     Heart sounds: Normal heart sounds.  Pulmonary:     Effort: Pulmonary effort is normal.  Skin:    General: Skin is warm and dry.  Neurological:     Mental Status: She is alert and oriented to person, place, and time. Mental status is at baseline.     No results found for any visits on 07/21/23.    Assessment & Plan:  Hyponatremia with normal extracellular fluid volume -     Basic metabolic panel; Future  Essential hypertension  Generalized anxiety disorder  Depression, recurrent (HCC)  Patient seen for HFU.  Recurrent hyponatremia, labile at time of discharge.  Sodium 132 on 07/20/2023.  Baseline sodium 131-133.  Will recheck sodium.  Continue current medications for anxiety and depression.  Keep follow-up appointment with [facility] provider.  BP well-controlled.  Continue to monitor.  Return in about 1 week (around 07/28/2023), or if symptoms worsen or fail to improve.   Deeann Saint, MD

## 2023-07-26 ENCOUNTER — Other Ambulatory Visit (INDEPENDENT_AMBULATORY_CARE_PROVIDER_SITE_OTHER): Payer: 59

## 2023-07-26 DIAGNOSIS — E871 Hypo-osmolality and hyponatremia: Secondary | ICD-10-CM

## 2023-07-27 LAB — BASIC METABOLIC PANEL
BUN: 10 mg/dL (ref 6–23)
CO2: 27 meq/L (ref 19–32)
Calcium: 9.4 mg/dL (ref 8.4–10.5)
Chloride: 92 meq/L — ABNORMAL LOW (ref 96–112)
Creatinine, Ser: 0.57 mg/dL (ref 0.40–1.20)
GFR: 103.69 mL/min (ref >60.00)
Glucose, Bld: 76 mg/dL (ref 70–99)
Potassium: 4.3 meq/L (ref 3.5–5.1)
Sodium: 127 meq/L — ABNORMAL LOW (ref 135–145)

## 2023-07-28 ENCOUNTER — Encounter (INDEPENDENT_AMBULATORY_CARE_PROVIDER_SITE_OTHER): Payer: Self-pay | Admitting: Family Medicine

## 2023-07-28 ENCOUNTER — Telehealth: Payer: Self-pay | Admitting: Family Medicine

## 2023-07-28 ENCOUNTER — Ambulatory Visit (INDEPENDENT_AMBULATORY_CARE_PROVIDER_SITE_OTHER): Payer: 59 | Admitting: Family Medicine

## 2023-07-28 VITALS — BP 143/80 | HR 64 | Temp 97.8°F | Ht 65.0 in | Wt 228.0 lb

## 2023-07-28 DIAGNOSIS — Z6839 Body mass index (BMI) 39.0-39.9, adult: Secondary | ICD-10-CM

## 2023-07-28 DIAGNOSIS — E669 Obesity, unspecified: Secondary | ICD-10-CM | POA: Diagnosis not present

## 2023-07-28 DIAGNOSIS — E1169 Type 2 diabetes mellitus with other specified complication: Secondary | ICD-10-CM

## 2023-07-28 DIAGNOSIS — E871 Hypo-osmolality and hyponatremia: Secondary | ICD-10-CM

## 2023-07-28 NOTE — Telephone Encounter (Deleted)
Having f/u with Gloria [name] at [facility]. Doctor wants to know if they can do labs for her sodium or if she she needs to come back here.

## 2023-07-28 NOTE — Progress Notes (Signed)
Chief Complaint:   OBESITY Taleea is here to discuss her progress with her obesity treatment plan along with follow-up of her obesity related diagnoses. Aimi is on the Category 2 Plan and states she is following her eating plan approximately 50% of the time. Stephany states she is doing 0 minutes 0 times per week.  Today's visit was #: 35 Starting weight: 274 lbs Starting date: 08/07/2021 Today's weight: 228 lbs Today's date: 07/28/2023 Total lbs lost to date: 46 Total lbs lost since last in-office visit: 7  Interim History: Patient has done well with her weight loss.  Unfortunately she was in the emergency room and was hospitalized 2 times since our last visit for unexplained hyponatremia.  Part of her weight loss may be due to fluid shifts.  Subjective:   1. Hyponatremia Patient was hospitalized after a low Na+.  She was on Benicar which was new in addition to Trileptal, which was traditionally decreasing her Na+ slightly.  Her Na+ level was checked 2 days ago and it has already fallen.  2. Type 2 diabetes mellitus with other specified complication, without long-term current use of insulin (HCC) Patient was told to stop Mounjaro due to low Na+.  She did not feel this was causing her decrease in Na+ and took another dose approximately 1 week ago.  Her hospitalist recommended that she see Endocrinology.  Assessment/Plan:   1. Hyponatremia Patient's hospital notes and labs were reviewed in depth.  Patient contacted her PCP and she was able to get an appointment tomorrow morning.  2. Type 2 diabetes mellitus with other specified complication, without long-term current use of insulin (HCC) Patient was referred to endocrinology (Dr. Elvera Lennox), and she is to hold [facility] until her hyponatremia situation is handled, and patient agreed to do so.  3. BMI 39.0-39.9,adult  4. Obesity, Beginning BMI 45.60 Marua is currently in the action stage of change. As such, her goal is to  continue with weight loss efforts. She has agreed to the Category 2 Plan.   Behavioral modification strategies: increasing lean protein intake and meal planning and cooking strategies.  Mckinlie has agreed to follow-up with our clinic in 3 weeks. She was informed of the importance of frequent follow-up visits to maximize her success with intensive lifestyle modifications for her multiple health conditions.   Objective:   Blood pressure (!) 143/80, pulse 64, temperature 97.8 F (36.6 C), height 5\' 5"  (1.651 m), weight 228 lb (103.4 kg), last menstrual period 04/25/2013, SpO2 97%. Body mass index is 37.94 kg/m.  Lab Results  Component Value Date   CREATININE 0.57 07/26/2023   BUN 10 07/26/2023   NA 127 (L) 07/26/2023   K 4.3 07/26/2023   CL 92 (L) 07/26/2023   CO2 27 07/26/2023   Lab Results  Component Value Date   ALT 21 07/13/2023   AST 19 07/13/2023   ALKPHOS 55 07/13/2023   BILITOT 0.3 07/13/2023   Lab Results  Component Value Date   HGBA1C 5.2 07/13/2023   HGBA1C 5.6 07/14/2022   HGBA1C 5.6 12/28/2021   HGBA1C 6.2 (H) 08/07/2021   HGBA1C 6.3 (A) 05/20/2021   Lab Results  Component Value Date   INSULIN 41.2 (H) 07/14/2022   INSULIN 30.5 (H) 12/28/2021   INSULIN 29.5 (H) 08/07/2021   Lab Results  Component Value Date   TSH 1.94 07/13/2023   Lab Results  Component Value Date   CHOL 259 (H) 07/13/2023   HDL 44.20 07/13/2023   LDLCALC 176 (  H) 07/13/2023   LDLDIRECT 145.0 07/18/2019   TRIG 195.0 (H) 07/13/2023   CHOLHDL 6 07/13/2023   Lab Results  Component Value Date   VD25OH 76.34 07/13/2023   VD25OH 64.0 07/14/2022   VD25OH 60.0 12/28/2021   Lab Results  Component Value Date   WBC 9.8 07/20/2023   HGB 12.7 07/20/2023   HCT 38.5 07/20/2023   MCV 89.5 07/20/2023   PLT 266 07/20/2023   Lab Results  Component Value Date   IRON 54 10/06/2017   TIBC 304 10/06/2017   FERRITIN 151 (H) 10/06/2017   Attestation Statements:   Reviewed by clinician on  day of visit: allergies, medications, problem list, medical history, surgical history, family history, social history, and previous encounter notes.  Time spent on visit including pre-visit chart review and post-visit care and charting was 45 minutes.   I, Burt Knack, am acting as transcriptionist for Quillian Quince, MD.  I have reviewed the above documentation for accuracy and completeness, and I agree with the above. -  Quillian Quince, MD

## 2023-07-28 NOTE — Telephone Encounter (Signed)
error 

## 2023-07-29 ENCOUNTER — Encounter: Payer: Self-pay | Admitting: Family Medicine

## 2023-07-29 ENCOUNTER — Ambulatory Visit: Payer: 59 | Admitting: Family Medicine

## 2023-07-29 ENCOUNTER — Telehealth: Payer: Self-pay | Admitting: Family Medicine

## 2023-07-29 VITALS — BP 118/68 | HR 62 | Temp 98.1°F | Ht 65.0 in | Wt 232.4 lb

## 2023-07-29 DIAGNOSIS — E871 Hypo-osmolality and hyponatremia: Secondary | ICD-10-CM | POA: Diagnosis not present

## 2023-07-29 LAB — BASIC METABOLIC PANEL
BUN: 11 mg/dL (ref 6–23)
CO2: 30 meq/L (ref 19–32)
Calcium: 9 mg/dL (ref 8.4–10.5)
Chloride: 95 meq/L — ABNORMAL LOW (ref 96–112)
Creatinine, Ser: 0.62 mg/dL (ref 0.40–1.20)
GFR: 101.6 mL/min (ref >60.00)
Glucose, Bld: 89 mg/dL (ref 70–99)
Potassium: 4.7 meq/L (ref 3.5–5.1)
Sodium: 129 meq/L — ABNORMAL LOW (ref 135–145)

## 2023-07-29 MED ORDER — SODIUM CHLORIDE 1 G PO TABS
1.0000 g | ORAL_TABLET | Freq: Once | ORAL | 2 refills | Status: DC | PRN
Start: 2023-07-29 — End: 2023-12-09

## 2023-07-29 NOTE — Patient Instructions (Addendum)
We will recheck your sodium today.  If it drops below 127 please proceed to the nearest emergency department.  If it is 127 or slightly higher we will have you start taking the sodium tabs daily.  Referrals to nephrology and endocrinology were placed.  It looks like Dr. Dalbert Garnet placed the referral for endocrinology yesterday.  You should expect phone calls about scheduling these appointments.  I was unable to find the name of the provider that did the surgery on your kidney.

## 2023-07-29 NOTE — Progress Notes (Signed)
Established Patient Office Visit   Subjective  Patient ID: Gloria [name], female    DOB: 20-Apr-1971  Age: 52 y.o. MRN: [id number]  Chief Complaint  Patient presents with   Medical Management of Chronic Issues    Low Sodium     Patient is a 52 year old female seen for follow-up on hyponatremia.  Patient hospitalized on 8/17 and 8/26 for symptomatic hyponatremia.  Sodium initially thought low due to hypovolemia and HCTZ in the setting of Trileptal.  Hydrochlorothiazide dc'd.  Sodium stable x 2 then began to decrease.  Urine osmols and sodium were normal.Sodium recheck on 07/26/2023 was 127.  Previously 132 on 07/20/2023.  Patient states she took 2 sodium tabs this week due to lightheaded/dizzy sensation.  Patient stopped Mounjaro 1 week ago.  Seen by weight management, Dr. Dalbert Garnet, who agrees on holding [facility].  Also had follow-up with [facility].  No changes with Trileptal and Rexulti.  Lunesta increased to 3 mg nightly.  Patient states today is actually a good day.  She was able to sleep last night from with 8 hours with CPAP.  Notes concern with declining sodium.  In the past (~1995?) patient had surgery on right kidney due to a cyst.  Unable to recall the name of the provider    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   DDD (degenerative disc disease), lumbar    Depression    Diabetes (HCC)    Diverticulosis    Drug use    Elevated LFTs    Fibromyalgia    GERD (gastroesophageal reflux disease)    History of ETOH abuse    Hyperlipidemia    Hyperplastic colon polyp    Internal hemorrhoids    Migraines    NAFLD (nonalcoholic fatty liver disease)    Obese     Obstructive sleep apnea 02/25/2015   not diagnosed per pt 04-03-20   Osteoarthritis    lower back- DDD   Periodic limb movement disorder 02/25/2014   Pre-diabetes    pt taking metformin   RLS (restless legs syndrome)    Sleep apnea    Smoker    Snoring    Substance abuse (HCC)    Swallowing difficulty    Vitamin B12 deficiency    Vitamin D deficiency    Past Surgical History:  Procedure Laterality Date   CERVICAL FUSION     COLONOSCOPY     CYSTOSCOPY  06/06/2013   Procedure: CYSTOSCOPY;  Surgeon: Ok Edwards, MD;  Location: [facility];  Service: Gynecology;;   EXCISION METACARPAL MASS Right 09/03/2021   Procedure: EXCISION METACARPAL MASS RIGHT SMALL FINGER;  Surgeon: Cindee Salt, MD;  Location: [facility];  Service: Orthopedics;  Laterality: Right;   INTRAUTERINE DEVICE INSERTION  08/09/2008   PARAGUARD- removed   kidney surgery  1995   abcess   LAPAROSCOPY N/A 06/06/2013   Procedure: LAPAROSCOPY DIAGNOSTIC;  Surgeon: Ok Edwards, MD;  Location: [facility];  Service: Gynecology;  Laterality: N/A;   LAPAROTOMY  06/06/2013   Procedure: EXPLORATORY  LAPAROTOMY;  Surgeon: Ok Edwards, MD;  Location: [facility];  Service: Gynecology;;   LIVER BIOPSY     partial right kideny  Right    radial tunnel Bilateral 2005   VAGINAL HYSTERECTOMY Left 06/06/2013   Procedure: Vaginal Hysterectomy with Patiial Right Salpingectomy;  Surgeon: Ok Edwards, MD;  Location: [facility];  Service: Gynecology;  Laterality: Left;   Social History   Tobacco Use   Smoking status: Former    Current packs/day: 0.00    Types: Cigarettes    Quit date: 01/26/2012    Years since quitting: 11.5   Smokeless tobacco: Never  Vaping Use   Vaping status: Never Used  Substance Use Topics   Alcohol use: No    Alcohol/week: 0.0 standard drinks of alcohol    Comment: History of alcohol abuse   Drug use: No   Family History  Problem Relation Age of Onset   Cancer Mother    Breast cancer  Mother    Depression Mother    Anxiety disorder Mother    Obesity Mother    Sleep apnea Mother    Obesity Father    Anxiety disorder Father    Cancer Father    Hyperlipidemia Father    Colon polyps Father    Diabetes Father    Cirrhosis Father    Liver cancer Father    Liver disease Father    Alcoholism Father    Depression Father    Alcohol abuse Father    Sleep apnea Father    Irritable bowel syndrome Sister    Sleep apnea Brother    Breast cancer Maternal Aunt    Alcoholism Maternal Aunt    Alcoholism Maternal Grandmother    Alcoholism Maternal Grandfather    Breast cancer Paternal Grandmother    Colon cancer Paternal Grandmother    Esophageal cancer Paternal Grandfather    Stomach cancer Paternal Grandfather    Alcoholism Paternal Grandfather    Rectal cancer Neg Hx    Allergies  Allergen Reactions   Benadryl [Diphenhydramine Hcl] Hives   Ropinirole Other (See Comments)   Levofloxacin Other (See Comments)   Aspirin    Codeine    Doxycycline    Lisinopril-Hydrochlorothiazide Other (See Comments)    Dry cough, dry eyes, insomnia, emotionally liable, increased thirst, brain fog, fatigue.   Metformin And Related     Blurred vision   Nuvigil [Armodafinil]     migraines    Olmesartan Other (See Comments)    May have contributed to hyponatremia.  Baseline sodium 131-133.   Requip [Ropinirole Hcl]     Increased headache   Semaglutide Other (See Comments)    Rybelsus caused depression and fibromyalgia flair.   Tramadol     Other reaction(s): Other (See Comments)   Wellbutrin [Bupropion]    Zolpidem       ROS Negative unless stated above    Objective:     BP 118/68 (BP Location: Left Wrist, Patient Position: Sitting, Cuff Size: Normal)   Pulse 62   Temp 98.1 F (36.7 C) (Oral)   Ht 5\' 5"  (1.651 m)   Wt 232 lb 6.4 oz (105.4 kg)   LMP 04/25/2013   SpO2 98%   BMI 38.67 kg/m    Physical Exam Constitutional:      General: She is not in acute  distress.    Appearance: Normal appearance.  HENT:     Head: Normocephalic and atraumatic.     Nose: Nose normal.  Mouth/Throat:     Mouth: Mucous membranes are moist.  Cardiovascular:     Rate and Rhythm: Normal rate.     Heart sounds: No murmur heard. Pulmonary:     Effort: Pulmonary effort is normal.  Skin:    General: Skin is warm and dry.  Neurological:     Mental Status: She is alert and oriented to person, place, and time.      No results found for any visits on 07/29/23.    Assessment & Plan:  Hyponatremia -     Basic metabolic panel -     Ambulatory referral to Nephrology -     Sodium Chloride; Take 1 tablet (1 g total) by mouth once as needed for up to 1 dose (symptomatic hyponatremia (fatigue, lethargy, confusion) or as directed by physician).  Dispense: 30 tablet; Refill: 2  Patient seen in office and hospitalized for new onset hyponatremia over the last few weeks.  Possible causes reviewed including medications.  Patient off Mounjaro x 1 week.  No changes with Trileptal.  Also consider other causes of hyponatremia including adrenal tumor or pituitary tumor though potassium has been stable.  Will repeat sodium this visit.  Referral to endocrinology made yesterday by another provider.  Will also refer to nephrology.  Consider additional studies such as 24-hour urine, cortisol levels, and imaging.  While awaiting lab results we will have patient take 1 g sodium tab daily.  Given strict precautions.  Return if symptoms worsen or fail to improve.   Deeann Saint, MD

## 2023-07-29 NOTE — Telephone Encounter (Signed)
Patient called regarding sodium recheck.  Sodium now 129.  Will have patient take sodium tabs daily.  Order placed to recheck sodium in the next week or 2, sooner if patient starts feeling symptomatic.  Abbe Amsterdam, MD.

## 2023-08-01 ENCOUNTER — Encounter (INDEPENDENT_AMBULATORY_CARE_PROVIDER_SITE_OTHER): Payer: Self-pay | Admitting: Family Medicine

## 2023-08-16 ENCOUNTER — Encounter (INDEPENDENT_AMBULATORY_CARE_PROVIDER_SITE_OTHER): Payer: Self-pay | Admitting: Family Medicine

## 2023-08-16 ENCOUNTER — Ambulatory Visit (INDEPENDENT_AMBULATORY_CARE_PROVIDER_SITE_OTHER): Payer: 59 | Admitting: Family Medicine

## 2023-08-16 VITALS — BP 139/79 | HR 62 | Temp 98.2°F | Ht 65.0 in | Wt 233.0 lb

## 2023-08-16 DIAGNOSIS — Z7985 Long-term (current) use of injectable non-insulin antidiabetic drugs: Secondary | ICD-10-CM

## 2023-08-16 DIAGNOSIS — Z6838 Body mass index (BMI) 38.0-38.9, adult: Secondary | ICD-10-CM | POA: Diagnosis not present

## 2023-08-16 DIAGNOSIS — E1169 Type 2 diabetes mellitus with other specified complication: Secondary | ICD-10-CM | POA: Diagnosis not present

## 2023-08-16 DIAGNOSIS — E669 Obesity, unspecified: Secondary | ICD-10-CM

## 2023-08-16 NOTE — Progress Notes (Unsigned)
Chief Complaint:   OBESITY Gloria [name] is here to discuss her progress with her obesity treatment plan along with follow-up of her obesity related diagnoses. Gloria [name] is on the Category 2 Plan and states she is following her eating plan approximately 25% of the time. Gloria [name] states she is doing 0 minutes 0 times per week.  Today's visit was #: 36 Starting weight: 274 lbs Starting date: 08/07/2021 Today's weight: 233 lbs Today's date: 08/16/2023 Total lbs lost to date: 41 Total lbs lost since last in-office visit: 0  Interim History: Patient is working on increasing her protein, but she has struggled recently with her mother being hospitalized.  She has had multiple visits with specialist.  Subjective:   1. Type 2 diabetes mellitus with other specified complication, without long-term current use of insulin (HCC) Patient is off Mounjaro due to low sodium.  She notes increased polyphagia off Mounjaro.  Assessment/Plan:   1. Type 2 diabetes mellitus with other specified complication, without long-term current use of insulin [facility]) Patient will contact Endocrinology to see what happened to her referral.  She will get the okay from Nephrology to restart [facility].  2. BMI 38.0-38.9,adult  3. Obesity, Beginning BMI 45.60 Gloria [name] is currently in the action stage of change. As such, her goal is to continue with weight loss efforts. She has agreed to the Category 2 Plan.   Behavioral modification strategies: increasing lean protein intake and meal planning and cooking strategies.  Gloria [name] has agreed to follow-up with our clinic in 4 weeks. She was informed of the importance of frequent follow-up visits to maximize her success with intensive lifestyle modifications for her multiple health conditions.   Objective:   Blood pressure 139/79, pulse 62, temperature 98.2 F (36.8 C), height 5\' 5"  (1.651 m), weight 233 lb (105.7 kg), last menstrual period 04/25/2013, SpO2 99%. Body mass index is  38.77 kg/m.  Lab Results  Component Value Date   CREATININE 0.62 07/29/2023   BUN 11 07/29/2023   NA 129 (L) 07/29/2023   K 4.7 07/29/2023   CL 95 (L) 07/29/2023   CO2 30 07/29/2023   Lab Results  Component Value Date   ALT 21 07/13/2023   AST 19 07/13/2023   ALKPHOS 55 07/13/2023   BILITOT 0.3 07/13/2023   Lab Results  Component Value Date   HGBA1C 5.2 07/13/2023   HGBA1C 5.6 07/14/2022   HGBA1C 5.6 12/28/2021   HGBA1C 6.2 (H) 08/07/2021   HGBA1C 6.3 (A) 05/20/2021   Lab Results  Component Value Date   INSULIN 41.2 (H) 07/14/2022   INSULIN 30.5 (H) 12/28/2021   INSULIN 29.5 (H) 08/07/2021   Lab Results  Component Value Date   TSH 1.94 07/13/2023   Lab Results  Component Value Date   CHOL 259 (H) 07/13/2023   HDL 44.20 07/13/2023   LDLCALC 176 (H) 07/13/2023   LDLDIRECT 145.0 07/18/2019   TRIG 195.0 (H) 07/13/2023   CHOLHDL 6 07/13/2023   Lab Results  Component Value Date   VD25OH 76.34 07/13/2023   VD25OH 64.0 07/14/2022   VD25OH 60.0 12/28/2021   Lab Results  Component Value Date   WBC 9.8 07/20/2023   HGB 12.7 07/20/2023   HCT 38.5 07/20/2023   MCV 89.5 07/20/2023   PLT 266 07/20/2023   Lab Results  Component Value Date   IRON 54 10/06/2017   TIBC 304 10/06/2017   FERRITIN 151 (H) 10/06/2017   Attestation Statements:   Reviewed by clinician on day of visit: allergies,  medications, problem list, medical history, surgical history, family history, social history, and previous encounter notes.  Time spent on visit including pre-visit chart review and post-visit care and charting was 35 minutes.   I, Burt Knack, am acting as transcriptionist for Quillian Quince, MD.  I have reviewed the above documentation for accuracy and completeness, and I agree with the above. -  Quillian Quince, MD

## 2023-08-17 ENCOUNTER — Encounter (INDEPENDENT_AMBULATORY_CARE_PROVIDER_SITE_OTHER): Payer: Self-pay | Admitting: Family Medicine

## 2023-08-31 ENCOUNTER — Encounter: Payer: Self-pay | Admitting: Family Medicine

## 2023-09-05 ENCOUNTER — Other Ambulatory Visit: Payer: Self-pay | Admitting: Family Medicine

## 2023-09-05 DIAGNOSIS — I10 Essential (primary) hypertension: Secondary | ICD-10-CM

## 2023-09-06 ENCOUNTER — Encounter (INDEPENDENT_AMBULATORY_CARE_PROVIDER_SITE_OTHER): Payer: Self-pay | Admitting: Family Medicine

## 2023-09-06 ENCOUNTER — Ambulatory Visit (INDEPENDENT_AMBULATORY_CARE_PROVIDER_SITE_OTHER): Payer: 59 | Admitting: Family Medicine

## 2023-09-06 VITALS — BP 143/84 | HR 70 | Temp 98.0°F | Ht 65.0 in | Wt 229.0 lb

## 2023-09-06 DIAGNOSIS — I10 Essential (primary) hypertension: Secondary | ICD-10-CM | POA: Diagnosis not present

## 2023-09-06 DIAGNOSIS — Z6838 Body mass index (BMI) 38.0-38.9, adult: Secondary | ICD-10-CM | POA: Diagnosis not present

## 2023-09-06 DIAGNOSIS — E669 Obesity, unspecified: Secondary | ICD-10-CM

## 2023-09-06 DIAGNOSIS — Z7985 Long-term (current) use of injectable non-insulin antidiabetic drugs: Secondary | ICD-10-CM

## 2023-09-06 DIAGNOSIS — E1169 Type 2 diabetes mellitus with other specified complication: Secondary | ICD-10-CM | POA: Diagnosis not present

## 2023-09-06 MED ORDER — TIRZEPATIDE 12.5 MG/0.5ML ~~LOC~~ SOAJ
12.5000 mg | SUBCUTANEOUS | 0 refills | Status: DC
Start: 2023-09-06 — End: 2023-09-27

## 2023-09-06 NOTE — Progress Notes (Signed)
Chief Complaint:   OBESITY Gloria [name] is here to discuss her progress with her obesity treatment plan along with follow-up of her obesity related diagnoses. Gloria [name] is on the Category 2 Plan and states she is following her eating plan approximately 50% of the time. Gloria [name] states she is doing 0 minutes 0 times per week.  Today's visit was #: 37 Starting weight: 274 lbs Starting date: 08/07/2021 Today's weight: 229 lbs Today's date: 09/06/2023 Total lbs lost to date: 45 Total lbs lost since last in-office visit: 4  Interim History: Patient has had a lot of stressors recently.  She did indulge in some comfort eating, but she was able to get back on track with her eating plan.  She is trying to work on increasing her exercise.  Subjective:   1. Type 2 diabetes mellitus with other specified complication, unspecified whether long term insulin use (HCC) Patient continues to work on her diet and weight loss to help control her glucose.  She restarted her Mounjaro and she notes her polyphagia has improved.  She denies abdominal pain.  2. Essential hypertension Patient's blood pressure is elevated today.  She feels this is due to her increased stress recently.  She continues to work on her weight loss to control her blood pressure.  She denies chest pain.  Assessment/Plan:   1. Type 2 diabetes mellitus with other specified complication, unspecified whether long term insulin use (HCC) Patient is ok to restart Mounjaro at 12.5 mg once weekly, and we will refill for 90 days.   - tirzepatide (MOUNJARO) 12.5 MG/0.5ML Pen; Inject 12.5 mg into the skin once a week.  Dispense: 6 mL; Refill: 0   2. Essential hypertension Patient will continue with her diet and exercise, and we will recheck her blood pressure at her next visit in 1 month.  3. BMI 38.0-38.9,adult  4. Obesity, Beginning BMI 45.60 Gloria [name] is currently in the action stage of change. As such, her goal is to continue with weight loss  efforts. She has agreed to the Category 2 Plan.   Exercise goals: All adults should avoid inactivity. Some physical activity is better than none, and adults who participate in any amount of physical activity gain some health benefits.  Behavioral modification strategies: increasing lean protein intake.  Gloria [name] has agreed to follow-up with our clinic in 4 weeks. She was informed of the importance of frequent follow-up visits to maximize her success with intensive lifestyle modifications for her multiple health conditions.   Objective:   Blood pressure (!) 143/84, pulse 70, temperature 98 F (36.7 C), height 5\' 5"  (1.651 m), weight 229 lb (103.9 kg), last menstrual period 04/25/2013, SpO2 98%. Body mass index is 38.11 kg/m.  Lab Results  Component Value Date   CREATININE 0.62 07/29/2023   BUN 11 07/29/2023   NA 129 (L) 07/29/2023   K 4.7 07/29/2023   CL 95 (L) 07/29/2023   CO2 30 07/29/2023   Lab Results  Component Value Date   ALT 21 07/13/2023   AST 19 07/13/2023   ALKPHOS 55 07/13/2023   BILITOT 0.3 07/13/2023   Lab Results  Component Value Date   HGBA1C 5.2 07/13/2023   HGBA1C 5.6 07/14/2022   HGBA1C 5.6 12/28/2021   HGBA1C 6.2 (H) 08/07/2021   HGBA1C 6.3 (A) 05/20/2021   Lab Results  Component Value Date   INSULIN 41.2 (H) 07/14/2022   INSULIN 30.5 (H) 12/28/2021   INSULIN 29.5 (H) 08/07/2021   Lab Results  Component Value  Date   TSH 1.94 07/13/2023   Lab Results  Component Value Date   CHOL 259 (H) 07/13/2023   HDL 44.20 07/13/2023   LDLCALC 176 (H) 07/13/2023   LDLDIRECT 145.0 07/18/2019   TRIG 195.0 (H) 07/13/2023   CHOLHDL 6 07/13/2023   Lab Results  Component Value Date   VD25OH 76.34 07/13/2023   VD25OH 64.0 07/14/2022   VD25OH 60.0 12/28/2021   Lab Results  Component Value Date   WBC 9.8 07/20/2023   HGB 12.7 07/20/2023   HCT 38.5 07/20/2023   MCV 89.5 07/20/2023   PLT 266 07/20/2023   Lab Results  Component Value Date   IRON 54  10/06/2017   TIBC 304 10/06/2017   FERRITIN 151 (H) 10/06/2017   Attestation Statements:   Reviewed by clinician on day of visit: allergies, medications, problem list, medical history, surgical history, family history, social history, and previous encounter notes.   I, Burt Knack, am acting as transcriptionist for Quillian Quince, MD.  I have reviewed the above documentation for accuracy and completeness, and I agree with the above. -  Quillian Quince, MD

## 2023-09-07 ENCOUNTER — Encounter (INDEPENDENT_AMBULATORY_CARE_PROVIDER_SITE_OTHER): Payer: Self-pay | Admitting: Family Medicine

## 2023-09-08 ENCOUNTER — Other Ambulatory Visit: Payer: Self-pay | Admitting: Family Medicine

## 2023-09-08 DIAGNOSIS — Z1231 Encounter for screening mammogram for malignant neoplasm of breast: Secondary | ICD-10-CM

## 2023-09-08 NOTE — Telephone Encounter (Signed)
Please advise 

## 2023-09-13 ENCOUNTER — Telehealth: Payer: Self-pay | Admitting: Family Medicine

## 2023-09-13 NOTE — Telephone Encounter (Signed)
Pt called to ask: How long does she have to wait after taking sodium to do blood work? Pt would like to go to [place] today. Please advise.

## 2023-09-14 ENCOUNTER — Other Ambulatory Visit (INDEPENDENT_AMBULATORY_CARE_PROVIDER_SITE_OTHER): Payer: 59

## 2023-09-14 ENCOUNTER — Other Ambulatory Visit: Payer: Self-pay

## 2023-09-14 ENCOUNTER — Encounter: Payer: Self-pay | Admitting: Family Medicine

## 2023-09-14 DIAGNOSIS — E871 Hypo-osmolality and hyponatremia: Secondary | ICD-10-CM

## 2023-09-14 LAB — BASIC METABOLIC PANEL
BUN: 10 mg/dL (ref 6–23)
CO2: 28 meq/L (ref 19–32)
Calcium: 8.9 mg/dL (ref 8.4–10.5)
Chloride: 95 meq/L — ABNORMAL LOW (ref 96–112)
Creatinine, Ser: 0.57 mg/dL (ref 0.40–1.20)
GFR: 103.59 mL/min (ref >60.00)
Glucose, Bld: 86 mg/dL (ref 70–99)
Potassium: 4.4 meq/L (ref 3.5–5.1)
Sodium: 129 meq/L — ABNORMAL LOW (ref 135–145)

## 2023-09-15 NOTE — Telephone Encounter (Signed)
Labs completed

## 2023-09-19 ENCOUNTER — Telehealth: Payer: Self-pay

## 2023-09-19 NOTE — Telephone Encounter (Signed)
[facility]APER[facility] HAS BEEN FAXED

## 2023-09-26 ENCOUNTER — Encounter: Payer: Self-pay | Admitting: Cardiovascular Disease

## 2023-09-26 ENCOUNTER — Ambulatory Visit: Payer: 59 | Attending: Cardiovascular Disease | Admitting: Cardiovascular Disease

## 2023-09-26 VITALS — BP 126/82 | HR 65 | Ht 64.5 in | Wt 237.0 lb

## 2023-09-26 DIAGNOSIS — I10 Essential (primary) hypertension: Secondary | ICD-10-CM

## 2023-09-26 DIAGNOSIS — R011 Cardiac murmur, unspecified: Secondary | ICD-10-CM | POA: Diagnosis not present

## 2023-09-26 DIAGNOSIS — F3340 Major depressive disorder, recurrent, in remission, unspecified: Secondary | ICD-10-CM

## 2023-09-26 DIAGNOSIS — E782 Mixed hyperlipidemia: Secondary | ICD-10-CM | POA: Diagnosis not present

## 2023-09-26 DIAGNOSIS — E871 Hypo-osmolality and hyponatremia: Secondary | ICD-10-CM

## 2023-09-26 DIAGNOSIS — E7800 Pure hypercholesterolemia, unspecified: Secondary | ICD-10-CM

## 2023-09-26 DIAGNOSIS — M797 Fibromyalgia: Secondary | ICD-10-CM

## 2023-09-26 MED ORDER — ROSUVASTATIN CALCIUM 20 MG PO TABS: 20.0000 mg | ORAL_TABLET | Freq: Every day | ORAL | 3 refills | Status: DC

## 2023-09-26 MED ORDER — ASPIRIN 81 MG PO TBEC: 81.0000 mg | DELAYED_RELEASE_TABLET | Freq: Every day | ORAL | Status: DC

## 2023-09-26 NOTE — Progress Notes (Signed)
Cardiology Office Note    Date:  10/01/2023   ID:  Gloria [name], DOB Nov 15, 1971, MRN [id number]  PCP:  Deeann Saint, MD  Cardiologist:  Nicki Guadalajara, MD   New cardiology evaluation to re-establish cardiology care   History of Present Illness:  Gloria [name] is a 52 y.o. female who initially was referred to me by Dr. Reynaldo Minium in 2014. I last saw her on January 23, 2015. She presents to re-establish cardiology care.   Gloria [name] has a long-standing history of depression and also had undergone detoxification for alcohol in the past. At that time, she had significant LFT elevation and was developing cirrhosis.  She  has been dry for several years and ultimately her liver function studies have normalized.    She was initially referred to me by Dr. Reynaldo Minium for evaluation of a cardiac murmur.    When I initially saw her, she also complained of  migraine headaches, neuropathy for which she has taken Neurontin and significant difficulty with sleep and had been taking Lunesta 3 mg daily. Upon further questioning she oftentimes would wake up at night gasping for breath. She admitted to  noring loudly . Her sleep is nonrestorative. She does note daytime fatigue the remote, she had been on Lexapro for many years for depression and recently has been treated with vilazodone at 40 mg.   A 2-D echo Doppler study on 11/01/2013 showed normal left ventricular size and function. She had normal diastolic function. She did have increased atrial septal thickness consistent with lipomatous hypertrophy. There was tricuspid regurgitation. There was no evidence for significant valvular pathology.    Because of difficulty with sleep, she underwent a diagnostic polysomnogram which was done at [facility]. She was not found to have obstructive sleep apnea. AHI was 0. Her, her RDI was 5.9. Of note, she did have frequent periodic leg movements at 257 with an index of 16 and several  arousals per  was felt most likely that she may very well have restless leg syndrome or which may be disrupting her sleep pattern rather than obstructive sleep apnea.  She was unable to tolerate a trial of requip or Mirapex.  When I reviewed her sleep study, there was complete absence of REM sleep.   When I last saw her in 2015 I discussed with her the likelihood that some of her antidepressant medications particularly tricyclic antidepressants, MAO inhibitors, an SSRI agents have been associated with a significant decrease in REM sleep as well as an increase in periodic limb movements.  Agents which do not suppress REM sleep include trazodone, but this has been associated with increased sedative effects.  I discussed with her that mirtazapine can either have a neutral effector can increase REM sleep and bupropion  can increase REM sleep and is less likely to increase periodic limb movement disorder.  She was to follow-up with her psychiatric physicians to see if possible .  Adjustments in her anti-depressant medication could be made.    I last saw her on January 23, 2015.  At that time she felt well and denied any chest pain or palpitations.  Her anxiety level is significantly reduced.  She apparently was given a trial of an oral appliance recommended by Dr. Anne Hahn but she stopped this because she did not tolerate it.  She now is on Viibyrd , an SSRI, for depression, which has been beneficial.  However, she admits to significant daytime fatigue  and excessive daytime sleepiness.  She denies shortness of breath.  She also is on Sinemet CR 25/100 at bedtime in addition to Neurontin 800 mg 3 times a day for her neuropathy and PLMS.  I have not seen Gloria [name] since 2016.  She is now followed by Abbe Amsterdam for primary care.  She also sees [facility] and sees Dr. Sabra Heck for nephrology.  She has a history of fibromyalgia, hypertension, type 2 diabetes mellitus, and was recently hospitalized in  August 2024 for with hyponatremia.  Her hydrochlorothiazide was discontinued and she was treated with IV hydration.  She is now followed by Dr. Marisue Humble.  She is on carvedilol 6.25 mg twice a day.  She takes Lunesta 2 mg to aid with sleep.  She has major depressive disorder and is on Trileptal.  She also takes lorazepam as needed for anxiety.  She takes Maxalt as needed for migraine headaches.  She is on Mounjaro weekly injection.  She had been on CPAP therapy.  She presents for evaluation.    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   DDD (degenerative disc disease), lumbar    Depression    Diabetes (HCC)    Diverticulosis    Drug use    Elevated LFTs    Fibromyalgia    GERD (gastroesophageal reflux disease)    History of ETOH abuse    Hyperlipidemia    Hyperplastic colon polyp    Internal hemorrhoids    Migraines    NAFLD (nonalcoholic fatty liver disease)    Obese    Obstructive sleep apnea 02/25/2015   not diagnosed per pt 04-03-20   Osteoarthritis    lower back- DDD   Periodic limb movement disorder 02/25/2014   Pre-diabetes    pt taking metformin   RLS (restless legs syndrome)    Sleep apnea    Smoker    Snoring    Substance abuse (HCC)    Swallowing difficulty    Vitamin B12 deficiency    Vitamin D deficiency     Past Surgical History:  Procedure Laterality Date   CERVICAL FUSION     COLONOSCOPY     CYSTOSCOPY  06/06/2013   Procedure: CYSTOSCOPY;  Surgeon: Ok Edwards, MD;  Location: [facility];  Service: Gynecology;;   EXCISION METACARPAL MASS Right 09/03/2021   Procedure: EXCISION METACARPAL MASS RIGHT SMALL FINGER;  Surgeon: Cindee Salt, MD;   Location: [facility];  Service: Orthopedics;  Laterality: Right;   INTRAUTERINE DEVICE INSERTION  08/09/2008   PARAGUARD- removed   kidney surgery  1995   abcess   LAPAROSCOPY N/A 06/06/2013   Procedure: LAPAROSCOPY DIAGNOSTIC;  Surgeon: Ok Edwards, MD;  Location: [facility];  Service: Gynecology;  Laterality: N/A;   LAPAROTOMY  06/06/2013   Procedure: EXPLORATORY LAPAROTOMY;  Surgeon: Ok Edwards, MD;  Location: [facility];  Service: Gynecology;;   LIVER BIOPSY     partial right kideny  Right    radial tunnel Bilateral 2005  VAGINAL HYSTERECTOMY Left 06/06/2013   Procedure: Vaginal Hysterectomy with Patiial Right Salpingectomy;  Surgeon: Ok Edwards, MD;  Location: [facility];  Service: Gynecology;  Laterality: Left;    Current Medications: Outpatient Medications Prior to Visit  Medication Sig Dispense Refill   albuterol (VENTOLIN HFA) 108 (90 Base) MCG/ACT inhaler Inhale 2 puffs into the lungs every 4 (four) hours as needed for wheezing or shortness of breath.     azelastine (ASTELIN) 0.1 % nasal spray Place 2 sprays into both nostrils 2 (two) times daily. 30 mL 12   B Complex Vitamins (B COMPLEX PO) Take 1 tablet by mouth daily.     blood glucose meter kit and supplies KIT Dispense based on patient and insurance preference. Use up to four times daily as directed. (FOR ICD-9 250.00, 250.01). 1 each 0   Blood Glucose Monitoring Suppl (ONETOUCH VERIO FLEX SYSTEM) w/Device KIT Use to check Blood sugars twice daily before meals 1 kit 0   Brexpiprazole (REXULTI) 4 MG TABS Take 4 mg by mouth daily.     Calcium Carbonate-Vitamin D 600-400 MG-UNIT tablet Take 1 tablet by mouth daily.     carvedilol (COREG) 6.25 MG tablet TAKE 1 TABLET(6.25 MG) BY MOUTH TWICE DAILY WITH A MEAL 60 tablet 1   cetirizine (ZYRTEC) 10 MG tablet Take 10 mg by mouth daily.     Coenzyme Q10 (COQ10) 100 MG CAPS Take 100 mg by mouth daily.     D3-50 1.25 MG (50000 UT) capsule Take 50,000 Units by  mouth once a week.     estradiol (VIVELLE-DOT) 0.05 MG/24HR patch Place 1 patch (0.05 mg total) onto the skin 2 (two) times a week. 24 patch 4   eszopiclone (LUNESTA) 2 MG TABS tablet Take 2 mg by mouth at bedtime.     fluticasone (CUTIVATE) 0.05 % cream Apply 1 Application topically 2 (two) times daily as needed (Behind ear/Behind belly flap).     glucose blood (CONTOUR NEXT TEST) test strip Use to test blood glucose twice daily 100 each 5   hydrOXYzine (ATARAX) 10 MG tablet Take 20 mg by mouth daily as needed for anxiety.     ibuprofen (ADVIL) 800 MG tablet TAKE 1 TABLET(800 MG) BY MOUTH EVERY 8 HOURS AS NEEDED FOR MODERATE PAIN 30 tablet 3   Lancets (ONETOUCH DELICA PLUS LANCET30G) MISC USE 1  TO CHECK GLUCOSE 4 TIMES DAILY 100 each 0   LORazepam (ATIVAN) 0.5 MG tablet Take 0.5 mg by mouth daily as needed for anxiety.     Magnesium 500 MG CAPS Take 500 mg by mouth at bedtime.     Multiple Vitamins-Minerals (MULTIVITAMIN PO) Take 1 tablet by mouth daily.      OMEGA 3-6-9 FATTY ACIDS PO Take 500 mg by mouth daily.     oxcarbazepine (TRILEPTAL) 600 MG tablet Take 600 mg by mouth 2 (two) times daily.     rizatriptan (MAXALT) 10 MG tablet TAKE 1 TABLET BY MOUTH AS NEEDED FOR MIGRAINE 10 tablet 2   scopolamine (TRANSDERM-SCOP) 1 MG/3DAYS Place 1 patch onto the skin See admin instructions. Apply one patch every 72 hours as needed for travel.     sodium chloride 1 g tablet Take 1 tablet (1 g total) by mouth once as needed for up to 1 dose (symptomatic hyponatremia (fatigue, lethargy, confusion) or as directed by physician). 30 tablet 2   thiamine (VITAMIN B-1) 50 MG tablet Take 50 mg by mouth daily.     vitamin C (ASCORBIC ACID)  500 MG tablet Take 500 mg by mouth daily.     VITAMIN E PO Take 900 mg by mouth daily.     tirzepatide (MOUNJARO) 12.5 MG/0.5ML Pen Inject 12.5 mg into the skin once a week. 6 mL 0   benzonatate (TESSALON) 100 MG capsule Take 100 mg by mouth 2 (two) times daily as needed for  cough.     No facility-administered medications prior to visit.     Allergies:   Benadryl [diphenhydramine hcl], Ropinirole, Levofloxacin, Aspirin, Codeine, Doxycycline, Lisinopril-hydrochlorothiazide, Metformin and related, Nuvigil [armodafinil], Olmesartan, Requip [ropinirole hcl], Semaglutide, Tramadol, Wellbutrin [bupropion], and Zolpidem   Social History   Socioeconomic History   Marital status: Divorced    Spouse name: Not on file   Number of children: 1   Years of education: 13   Highest education level: Associate degree: academic program  Occupational History   Occupation: ACCOUNTING   Occupation: Armed forces operational officer  Tobacco Use   Smoking status: Former    Current packs/day: 0.00    Types: Cigarettes    Quit date: 01/26/2012    Years since quitting: 11.6   Smokeless tobacco: Never  Vaping Use   Vaping status: Never Used  Substance and Sexual Activity   Alcohol use: No    Alcohol/week: 0.0 standard drinks of alcohol    Comment: History of alcohol abuse   Drug use: No   Sexual activity: Not Currently    Partners: Male    Birth control/protection: Surgical, Abstinence    Comment: hysterectomy  Other Topics Concern   Not on file  Social History Narrative   Patient lives at home and works full time.   Education some college.   Right handed.   Caffeine: 2 cups daily.   Social Determinants of Health   Financial Resource Strain: Low Risk  (07/20/2023)   Overall Financial Resource Strain (CARDIA)    Difficulty of Paying Living Expenses: Not very hard  Food Insecurity: No Food Insecurity (07/20/2023)   Hunger Vital Sign    Worried About Running Out of Food in the Last Year: Never true    Ran Out of Food in the Last Year: Never true  Transportation Needs: No Transportation Needs (07/20/2023)   PRAPARE - Administrator, Civil Service (Medical): No    Lack of Transportation (Non-Medical): No  Physical Activity: Insufficiently Active (07/20/2023)   Exercise  Vital Sign    Days of Exercise per Week: 2 days    Minutes of Exercise per Session: 20 min  Stress: No Stress Concern Present (07/20/2023)   [employer] of Occupational Health - Occupational Stress Questionnaire    Feeling of Stress : Only a little  Social Connections: Moderately Integrated (07/20/2023)   Social Connection and Isolation Panel [NHANES]    Frequency of Communication with Friends and Family: More than three times a week    Frequency of Social Gatherings with Friends and Family: More than three times a week    Attends Religious Services: More than 4 times per year    Active Member of [employer] or Organizations: Yes    Attends Engineer, structural: More than 4 times per year    Marital Status: Divorced    Socially she is divorced for 7 years.  She has 1 child.  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  She quit smoking in March 2013.  There is no alcohol use.  She has not been exercising but she  is full to start water exercise again.  Family History:  The patient's family history includes Alcohol abuse in her father; Alcoholism in her father, maternal aunt, maternal grandfather, maternal grandmother, and paternal grandfather; Anxiety disorder in her father and mother; Breast cancer in her maternal aunt, mother, and paternal grandmother; Cancer in her father and mother; Cirrhosis in her father; Colon cancer in her paternal grandmother; Colon polyps in her father; Depression in her father and mother; Diabetes in her father; Esophageal cancer in her paternal grandfather; Hyperlipidemia in her father; Irritable bowel syndrome in her sister; Liver cancer in her father; Liver disease in her father; Obesity in her father and mother; Sleep apnea in her brother, father, and mother; Stomach cancer in her paternal grandfather.   ROS General: Negative; No fevers, chills, or night sweats;  HEENT: Negative; No changes in vision or  hearing, sinus congestion, difficulty swallowing Pulmonary: Negative; No cough, wheezing, shortness of breath, hemoptysis Cardiovascular: Negative; No chest pain, presyncope, syncope, palpitations GI: Negative; No nausea, vomiting, diarrhea, or abdominal pain GU: Negative; No dysuria, hematuria, or difficulty voiding Musculoskeletal: Negative; no myalgias, joint pain, or weakness Hematologic/Oncology: Negative; no easy bruising, bleeding Endocrine: Negative; no heat/cold intolerance; no diabetes Neuro: Negative; no changes in balance, headaches Skin: Negative; No rashes or skin lesions Psychiatric: Negative; No behavioral problems, depression Sleep: Negative; No snoring, daytime sleepiness, hypersomnolence, bruxism, restless legs, hypnogognic hallucinations, no cataplexy Other comprehensive 14 point system review is negative.   PHYSICAL EXAM:   VS:  BP 126/82   Pulse 65   Ht 5' 4.5" (1.638 m)   Wt 237 lb (107.5 kg)   LMP 04/25/2013   SpO2 97%   BMI 40.05 kg/m    Wt Readings from Last 3 Encounters:  09/27/23 231 lb (104.8 kg)  09/26/23 237 lb (107.5 kg)  09/06/23 229 lb (103.9 kg)    General: Alert, oriented, no distress.  Skin: normal turgor, no rashes, warm and dry HEENT: Normocephalic, atraumatic. Pupils equal round and reactive to light; sclera anicteric; extraocular muscles intact; Fundi ** Nose without nasal septal hypertrophy Mouth/Parynx benign; Mallinpatti scale Neck: No JVD, no carotid bruits; normal carotid upstroke Lungs: clear to ausculatation and percussion; no wheezing or rales Chest wall: without tenderness to palpitation Heart: PMI not displaced, RRR, s1 s2 normal, 1/6 systolic murmur, no diastolic murmur, no rubs, gallops, thrills, or heaves Abdomen: soft, nontender; no hepatosplenomehaly, BS+; abdominal aorta nontender and not dilated by palpation. Back: no CVA tenderness Pulses 2+ Musculoskeletal: full range of motion, normal strength, no joint  deformities Extremities: no clubbing cyanosis or edema, Homan's sign negative  Neurologic: grossly nonfocal; Cranial nerves grossly wnl Psychologic: Normal mood and affect   Studies/Labs Reviewed:   EKG Interpretation Date/Time:  Monday September 26 2023 16:10:57 EST Ventricular Rate:  65 PR Interval:  174 QRS Duration:  92 QT Interval:  418 QTC Calculation: 434 R Axis:   34  Text Interpretation: Normal sinus rhythm Nonspecific ST abnormality When compared with ECG of 18-Jul-2023 15:59, No significant change was found Confirmed by Nicki Guadalajara ([id number]) on 09/26/2023 4:33:21 PM    Recent Labs:    Latest Ref Rng & Units 09/14/2023   12:35 PM 07/29/2023   11:44 AM 07/26/2023    4:11 PM  BMP  Glucose 70 - 99 mg/dL 86  89  76   BUN 6 - 23 mg/dL 10  11  10    Creatinine 0.40 - 1.20 mg/dL 9.14  7.82  9.56   Sodium 135 -  145 mEq/L 129  129  127   Potassium 3.5 - 5.1 mEq/L 4.4  4.7  4.3   Chloride 96 - 112 mEq/L 95  95  92   CO2 19 - 32 mEq/L 28  30  27    Calcium 8.4 - 10.5 mg/dL 8.9  9.0  9.4         Latest Ref Rng & Units 07/13/2023    9:41 AM 07/09/2023    2:18 PM 07/14/2022    9:34 AM  Hepatic Function  Total Protein 6.0 - 8.3 g/dL 7.3  7.6  6.8   Albumin 3.5 - 5.2 g/dL 4.1  4.3  4.2   AST 0 - 37 U/L 19  17  19    ALT 0 - 35 U/L 21  17  22    Alk Phosphatase 39 - 117 U/L 55  55  79   Total Bilirubin 0.2 - 1.2 mg/dL 0.3  0.4  <1.6        Latest Ref Rng & Units 07/20/2023    5:53 AM 07/19/2023    6:08 AM 07/18/2023    4:31 PM  CBC  WBC 4.0 - 10.5 K/uL 9.8  9.0  10.4   Hemoglobin 12.0 - 15.0 g/dL 10.9  60.4  54.0   Hematocrit 36.0 - 46.0 % 38.5  37.4  39.7   Platelets 150 - 400 K/uL 266  272  312    Lab Results  Component Value Date   MCV 89.5 07/20/2023   MCV 86.0 07/19/2023   MCV 85.4 07/18/2023   Lab Results  Component Value Date   TSH 1.94 07/13/2023   Lab Results  Component Value Date   HGBA1C 5.2 07/13/2023     BNP    Component Value Date/Time   BNP  26.1 07/09/2023 1418    ProBNP No results found for: "PROBNP"   Lipid Panel     Component Value Date/Time   CHOL 259 (H) 07/13/2023 0941   CHOL 216 (H) 07/14/2022 0934   TRIG 195.0 (H) 07/13/2023 0941   HDL 44.20 07/13/2023 0941   HDL 44 07/14/2022 0934   CHOLHDL 6 07/13/2023 0941   VLDL 39.0 07/13/2023 0941   LDLCALC 176 (H) 07/13/2023 0941   LDLCALC 139 (H) 07/14/2022 0934   LDLCALC 171 (H) 09/19/2020 0847   LDLDIRECT 145.0 07/18/2019 1410   LABVLDL 33 07/14/2022 0934     RADIOLOGY: ECHOCARDIOGRAM COMPLETE  Result Date: 09/27/2023    ECHOCARDIOGRAM REPORT   Patient Name:   Gloria [name] Glascoe Date of Exam: 09/27/2023 Medical Rec #:  [id number]        Height:       65.0 in Accession #:    [id number]       Weight:       231.0 lb Date of Birth:  [birth year]         BSA:          2.103 m Patient Age:    52 years         BP:           133/63 mmHg Patient Gender: F                HR:           67 bpm. Exam Location:  [address] Procedure: 2D Echo, Color Doppler, Cardiac Doppler, 3D Echo and Strain Analysis Indications:    Murmur R01.1  History:        Patient has prior history of  Echocardiogram examinations, most                 recent 11/01/2013. CKD; Risk Factors:Dyslipidemia.  Sonographer:    Thurman Coyer RDCS Referring Phys: [phone] Zyrell Carmean A Mysha Peeler IMPRESSIONS  1. Left ventricular ejection fraction, by estimation, is 60 to 65%. The left ventricle has normal function. The left ventricle has no regional wall motion abnormalities. The left ventricular internal cavity size was mildly dilated. Left ventricular diastolic parameters were normal. The average left ventricular global longitudinal strain is -20.1 %. The global longitudinal strain is normal.  2. Right ventricular systolic function is normal. The right ventricular size is normal. Tricuspid regurgitation signal is inadequate for assessing PA pressure.  3. The mitral valve is normal in structure. Trivial mitral valve regurgitation. No  evidence of mitral stenosis.  4. The aortic valve is normal in structure. Aortic valve regurgitation is not visualized. No aortic stenosis is present.  5. Aortic dilatation noted. There is mild dilatation of the ascending aorta, measuring 38 mm.  6. The inferior vena cava is normal in size with greater than 50% respiratory variability, suggesting right atrial pressure of 3 mmHg. FINDINGS  Left Ventricle: Left ventricular ejection fraction, by estimation, is 60 to 65%. The left ventricle has normal function. The left ventricle has no regional wall motion abnormalities. The average left ventricular global longitudinal strain is -20.1 %. The global longitudinal strain is normal. The left ventricular internal cavity size was mildly dilated. There is no left ventricular hypertrophy. Left ventricular diastolic parameters were normal. Normal left ventricular filling pressure. Right Ventricle: The right ventricular size is normal. No increase in right ventricular wall thickness. Right ventricular systolic function is normal. Tricuspid regurgitation signal is inadequate for assessing PA pressure. Left Atrium: Left atrial size was normal in size. Right Atrium: Right atrial size was normal in size. Pericardium: There is no evidence of pericardial effusion. Mitral Valve: The mitral valve is normal in structure. Trivial mitral valve regurgitation. No evidence of mitral valve stenosis. Tricuspid Valve: The tricuspid valve is normal in structure. Tricuspid valve regurgitation is not demonstrated. No evidence of tricuspid stenosis. Aortic Valve: The aortic valve is normal in structure. Aortic valve regurgitation is not visualized. No aortic stenosis is present. Pulmonic Valve: The pulmonic valve was normal in structure. Pulmonic valve regurgitation is not visualized. No evidence of pulmonic stenosis. Aorta: Aortic dilatation noted. There is mild dilatation of the ascending aorta, measuring 38 mm. Venous: The inferior vena cava is  normal in size with greater than 50% respiratory variability, suggesting right atrial pressure of 3 mmHg. IAS/Shunts: No atrial level shunt detected by color flow Doppler.  LEFT VENTRICLE PLAX 2D LVIDd:         5.50 cm   Diastology LVIDs:         3.60 cm   LV e' medial:    8.70 cm/s LV PW:         1.10 cm   LV E/e' medial:  10.4 LV IVS:        1.10 cm   LV e' lateral:   10.70 cm/s LVOT diam:     2.10 cm   LV E/e' lateral: 8.5 LV SV:         74 LV SV Index:   35        2D Longitudinal Strain LVOT Area:     3.46 cm  2D Strain GLS (A2C):   -21.2 %  2D Strain GLS (A3C):   -18.9 %                          2D Strain GLS (A4C):   -20.3 %                          2D Strain GLS Avg:     -20.1 % RIGHT VENTRICLE             IVC RV Basal diam:  3.30 cm     IVC diam: 1.90 cm RV Mid diam:    2.90 cm RV S prime:     10.90 cm/s TAPSE (M-mode): 2.6 cm LEFT ATRIUM             Index        RIGHT ATRIUM           Index LA diam:        3.50 cm 1.66 cm/m   RA Area:     14.80 cm LA Vol (A2C):   56.8 ml 27.01 ml/m  RA Volume:   36.30 ml  17.26 ml/m LA Vol (A4C):   38.4 ml 18.26 ml/m LA Biplane Vol: 48.5 ml 23.06 ml/m  AORTIC VALVE LVOT Vmax:   93.30 cm/s LVOT Vmean:  59.700 cm/s LVOT VTI:    0.214 m  AORTA Ao Root diam: 2.70 cm Ao Asc diam:  3.80 cm MITRAL VALVE MV Area (PHT): 3.31 cm    SHUNTS MV Decel Time: 229 msec    Systemic VTI:  0.21 m MV E velocity: 90.70 cm/s  Systemic Diam: 2.10 cm MV A velocity: 70.70 cm/s MV E/A ratio:  1.28 Armanda Magic MD Electronically signed by Armanda Magic MD Signature Date/Time: 09/27/2023/3:55:54 PM    Final      Additional studies/ records that were reviewed today include:  Office notes from 2014 and 2016 were reviewed.  Recent hospitalization from August 26 through August 28 discharge note by Jonah Blue was reviewed.   ASSESSMENT:    1. Essential hypertension   2. Mixed hyperlipidemia   3. Cardiac murmur   4. Morbid obesity (HCC)   5. Hyponatremia   6.  Fibromyalgia   7. MDD (recurrent major depressive disorder) in remission [facility])     PLAN:  Gloria [name] is a 52 year old female who has a history of diabetes mellitus, hypertension, hyperlipidemia, fibromyalgia, obesity, chronic pain, who recently was hospitalized with hyponatremia.  She has had issues with major depressive disorder and currently is on Trileptal.  She also has issues with anxiety and depression.  She is followed by Dr. Kandyce Rud of nephrology following her recent episode of significant hyponatremia.  She has chronic pain and issues with fibromyalgia.  Her blood pressure today is stable and a repeat by me was 124/72.  Her ECG shows normal sinus rhythm at 65 with no significant ST-T abnormalities.  There is early transition.  PR interval and QTc intervals are normal.  I reviewed her most recent hospital records when she presented in August serum sodium was 124.  This had increased to 129.  In August 2024 lipid study shows significant mixed hyperlipidemia with total cholesterol 259, triglycerides 195, HDL 44, and LDL cholesterol 176.  Presently, I am recommending she undergo a 2D echo Doppler study to assess systolic and diastolic function.  With her significant lipid elevation I am recommending a screening coronary calcium score to assess for coronary  calcif later ication.  I am starting her on rosuvastatin 20 mg daily for significant hyper lipidemia.  I also have recommended she initiate a baby aspirin.  In 3 months, I will repeat a comprehensive metabolic panel, lipid panel, and will also check an LP(a).  She is now seeing Dr. Marisue Humble and apparently she is on liquids 36 ounces per day restriction.  I will see her in 4 months for follow-up evaluation or sooner as needed.   Medication Adjustments/Labs and Tests Ordered: Current medicines are reviewed at length with the patient today.  Concerns regarding medicines are outlined above.  Medication changes, Labs and Tests ordered today  are listed in the Patient Instructions below. Patient Instructions  Medication Instructions:   Start rosuvastatin 20 mg daily at bedtime    Start taking an Aspirin 81 mg  daily   *If you need a refill on your cardiac medications before your next appointment, please call your pharmacy*   Lab Work: in 3 months  fasting  Lipid Cmp Lpa  If you have labs (blood work) drawn today and your tests are completely normal, you will receive your results only by: MyChart Message (if you have MyChart) OR A paper copy in the mail If you have any lab test that is abnormal or we need to change your treatment, we will call you to review the results.   Testing/Procedures: Will be schedule at [address] Your physician has requested that you have an echocardiogram. Echocardiography is a painless test that uses sound waves to create images of your heart. It provides your doctor with information about the size and shape of your heart and how well your heart's chambers and valves are working. This procedure takes approximately one hour. There are no restrictions for this procedure. Please do NOT wear cologne, perfume, aftershave, or lotions (deodorant is allowed). Please arrive 15 minutes prior to your appointment time.    And   CT coronary calcium score.   Test locations:  [facility]  [facility] at [facility]  This is $99 out of pocket.   Coronary CalciumScan A coronary calcium scan is an imaging test used to look for deposits of calcium and other fatty materials (plaques) in the inner lining of the blood vessels of the heart (coronary arteries). These deposits of calcium and plaques can partly clog and narrow the coronary arteries without producing any symptoms or warning signs. This puts a person at risk for a heart attack. This test can detect these deposits before symptoms develop. Tell a  health care provider about: Any allergies you have. All medicines you are taking, including vitamins, herbs, eye drops, creams, and over-the-counter medicines. Any problems you or family members have had with anesthetic medicines. Any blood disorders you have. Any surgeries you have had. Any medical conditions you have. Whether you are pregnant or may be pregnant. What are the risks? Generally, this is a safe procedure. However, problems may occur, including: Harm to a pregnant woman and her unborn baby. This test involves the use of radiation. Radiation exposure can be dangerous to a pregnant woman and her unborn baby. If you are pregnant, you generally should not have this procedure done. Slight increase in the risk of cancer. This is because of the radiation involved in the test. What happens before the procedure? No preparation is needed for this procedure. What happens during the procedure? You will undress and remove  any jewelry around your neck or chest. You will put on a hospital gown. Sticky electrodes will be placed on your chest. The electrodes will be connected to an electrocardiogram (ECG) machine to record a tracing of the electrical activity of your heart. A CT scanner will take pictures of your heart. During this time, you will be asked to lie still and hold your breath for 2-3 seconds while a picture of your heart is being taken. The procedure may vary among health care providers and hospitals. What happens after the procedure? You can get dressed. You can return to your normal activities. It is up to you to get the results of your test. Ask your health care provider, or the department that is doing the test, when your results will be ready. Summary A coronary calcium scan is an imaging test used to look for deposits of calcium and other fatty materials (plaques) in the inner lining of the blood vessels of the heart (coronary arteries). Generally, this is a safe procedure.  Tell your health care provider if you are pregnant or may be pregnant. No preparation is needed for this procedure. A CT scanner will take pictures of your heart. You can return to your normal activities after the scan is done. This information is not intended to replace advice given to you by your health care provider. Make sure you discuss any questions you have with your health care provider. Document Released: 05/06/2008 Document Revised: 09/27/2016 Document Reviewed: 09/27/2016 Elsevier Interactive Patient Education  2017 Elsevier [employer].  Please note: We ask at that you not bring children with you during ultrasound (echo/ vascular) testing. Due to room size and safety concerns, children are not allowed in the ultrasound rooms during exams. Our front office staff cannot provide observation of children in our lobby area while testing is being conducted. An adult accompanying a patient to their appointment will only be allowed in the ultrasound room at the discretion of the ultrasound technician under special circumstances. We apologize for any inconvenience.    Follow-Up: At [facility], you and your health needs are our priority.  As part of our continuing mission to provide you with exceptional heart care, we have created designated Provider Care Teams.  These Care Teams include your primary Cardiologist (physician) and Advanced Practice Providers (APPs -  Physician Assistants and Nurse Practitioners) who all work together to provide you with the care you need, when you need it.     Your next appointment:   4 month(s)  The format for your next appointment:   In Person  Provider:   Nicki Guadalajara, MD    Other Instructions    Signed, Nicki Guadalajara, MD  10/01/2023 1:09 PM    [facility] [address], [address], [place], [place]  [address] Phone: [phone]

## 2023-09-26 NOTE — Patient Instructions (Addendum)
Medication Instructions:   Start rosuvastatin 20 mg daily at bedtime    Start taking an Aspirin 81 mg  daily   *If you need a refill on your cardiac medications before your next appointment, please call your pharmacy*   Lab Work: in 3 months  fasting  Lipid Cmp Lpa  If you have labs (blood work) drawn today and your tests are completely normal, you will receive your results only by: MyChart Message (if you have MyChart) OR A paper copy in the mail If you have any lab test that is abnormal or we need to change your treatment, we will call you to review the results.   Testing/Procedures: Will be schedule at [address] Your physician has requested that you have an echocardiogram. Echocardiography is a painless test that uses sound waves to create images of your heart. It provides your doctor with information about the size and shape of your heart and how well your heart's chambers and valves are working. This procedure takes approximately one hour. There are no restrictions for this procedure. Please do NOT wear cologne, perfume, aftershave, or lotions (deodorant is allowed). Please arrive 15 minutes prior to your appointment time.    And   CT coronary calcium score.   Test locations:  [facility]  [facility] [facility] at [facility]  This is $99 out of pocket.   Coronary CalciumScan A coronary calcium scan is an imaging test used to look for deposits of calcium and other fatty materials (plaques) in the inner lining of the blood vessels of the heart (coronary arteries). These deposits of calcium and plaques can partly clog and narrow the coronary arteries without producing any symptoms or warning signs. This puts a person at risk for a heart attack. This test can detect these deposits before symptoms develop. Tell a health care provider about: Any allergies you have. All medicines you  are taking, including vitamins, herbs, eye drops, creams, and over-the-counter medicines. Any problems you or family members have had with anesthetic medicines. Any blood disorders you have. Any surgeries you have had. Any medical conditions you have. Whether you are pregnant or may be pregnant. What are the risks? Generally, this is a safe procedure. However, problems may occur, including: Harm to a pregnant woman and her unborn baby. This test involves the use of radiation. Radiation exposure can be dangerous to a pregnant woman and her unborn baby. If you are pregnant, you generally should not have this procedure done. Slight increase in the risk of cancer. This is because of the radiation involved in the test. What happens before the procedure? No preparation is needed for this procedure. What happens during the procedure? You will undress and remove any jewelry around your neck or chest. You will put on a hospital gown. Sticky electrodes will be placed on your chest. The electrodes will be connected to an electrocardiogram (ECG) machine to record a tracing of the electrical activity of your heart. A CT scanner will take pictures of your heart. During this time, you will be asked to lie still and hold your breath for 2-3 seconds while a picture of your heart is being taken. The procedure may vary among health care providers and hospitals. What happens after the procedure? You can get dressed. You can return to your normal activities. It is up to you to get the results of your test. Ask your health care provider, or  the department that is doing the test, when your results will be ready. Summary A coronary calcium scan is an imaging test used to look for deposits of calcium and other fatty materials (plaques) in the inner lining of the blood vessels of the heart (coronary arteries). Generally, this is a safe procedure. Tell your health care provider if you are pregnant or may be  pregnant. No preparation is needed for this procedure. A CT scanner will take pictures of your heart. You can return to your normal activities after the scan is done. This information is not intended to replace advice given to you by your health care provider. Make sure you discuss any questions you have with your health care provider. Document Released: 05/06/2008 Document Revised: 09/27/2016 Document Reviewed: 09/27/2016 Elsevier Interactive Patient Education  2017 Elsevier Inc.  Please note: We ask at that you not bring children with you during ultrasound (echo/ vascular) testing. Due to room size and safety concerns, children are not allowed in the ultrasound rooms during exams. Our front office staff cannot provide observation of children in our lobby area while testing is being conducted. An adult accompanying a patient to their appointment will only be allowed in the ultrasound room at the discretion of the ultrasound technician under special circumstances. We apologize for any inconvenience.    Follow-Up: At [facility], you and your health needs are our priority.  As part of our continuing mission to provide you with exceptional heart care, we have created designated Provider Care Teams.  These Care Teams include your primary Cardiologist (physician) and Advanced Practice Providers (APPs -  Physician Assistants and Nurse Practitioners) who all work together to provide you with the care you need, when you need it.     Your next appointment:   4 month(s)  The format for your next appointment:   In Person  Provider:   Nicki Guadalajara, MD    Other Instructions

## 2023-09-27 ENCOUNTER — Encounter (INDEPENDENT_AMBULATORY_CARE_PROVIDER_SITE_OTHER): Payer: Self-pay | Admitting: Family Medicine

## 2023-09-27 ENCOUNTER — Ambulatory Visit (HOSPITAL_COMMUNITY): Payer: 59 | Attending: Cardiology

## 2023-09-27 ENCOUNTER — Ambulatory Visit (INDEPENDENT_AMBULATORY_CARE_PROVIDER_SITE_OTHER): Payer: 59 | Admitting: Family Medicine

## 2023-09-27 VITALS — BP 133/63 | HR 83 | Temp 97.7°F | Ht 65.0 in | Wt 231.0 lb

## 2023-09-27 DIAGNOSIS — R011 Cardiac murmur, unspecified: Secondary | ICD-10-CM | POA: Diagnosis present

## 2023-09-27 DIAGNOSIS — E1169 Type 2 diabetes mellitus with other specified complication: Secondary | ICD-10-CM | POA: Diagnosis not present

## 2023-09-27 DIAGNOSIS — E871 Hypo-osmolality and hyponatremia: Secondary | ICD-10-CM

## 2023-09-27 DIAGNOSIS — E782 Mixed hyperlipidemia: Secondary | ICD-10-CM

## 2023-09-27 DIAGNOSIS — Z6838 Body mass index (BMI) 38.0-38.9, adult: Secondary | ICD-10-CM

## 2023-09-27 DIAGNOSIS — I10 Essential (primary) hypertension: Secondary | ICD-10-CM | POA: Diagnosis not present

## 2023-09-27 DIAGNOSIS — E7800 Pure hypercholesterolemia, unspecified: Secondary | ICD-10-CM | POA: Diagnosis not present

## 2023-09-27 DIAGNOSIS — E785 Hyperlipidemia, unspecified: Secondary | ICD-10-CM

## 2023-09-27 DIAGNOSIS — Z7985 Long-term (current) use of injectable non-insulin antidiabetic drugs: Secondary | ICD-10-CM

## 2023-09-27 LAB — ECHOCARDIOGRAM COMPLETE
Area-P 1/2: 3.31 cm2
Height: 65 in
S' Lateral: 3.6 cm
Weight: 3696 [oz_av]

## 2023-09-27 MED ORDER — TIRZEPATIDE 15 MG/0.5ML ~~LOC~~ SOAJ: 15.0000 mg | SUBCUTANEOUS | 0 refills | Status: DC

## 2023-09-27 NOTE — Progress Notes (Signed)
.sm[facility]  Office: [phone]  /  Fax: [phone]  WEIGHT SUMMARY AND BIOMETRICS  Anthropometric Measurements Height: 5\' 5"  (1.651 m) Weight: 231 lb (104.8 kg) BMI (Calculated): 38.44 Weight at Last Visit: 229 lb Weight Lost Since Last Visit: 0 Weight Gained Since Last Visit: 2 lb Total Weight Loss (lbs): 43 lb (19.5 kg)   Body Composition  Body Fat %: 47 % Fat Mass (lbs): 108.8 lbs Muscle Mass (lbs): 116.6 lbs Total Body Water (lbs): 80.4 lbs Visceral Fat Rating : 14   Other Clinical Data Fasting: No Labs: No Today's Visit #: 38    Chief Complaint: OBESITY    History of Present Illness   The patient, with a history of type 2 diabetes and obesity, presents with concerns about recent weight gain and fatigue. She reports a weight gain of two pounds over the past three weeks, despite adherence to a category two eating plan approximately 75% of the time. She is currently not engaging in any exercise due to fatigue, which she describes as feeling "drained" and "out." This fatigue has been ongoing for about two weeks and has been so severe that it has led to early departure from work and difficulty performing daily activities such as showering.  The patient also reports concerns about her sodium levels, suspecting they may be off. She recently had blood work done by a urologist to investigate this issue, but results are pending. She has been experiencing increased thirst and has been advised to limit her fluid intake to 36 ounces per day due to hyponatremia.  In addition to these concerns, the patient is also dealing with overactive bladder issues and has been advised to cut out all caffeine. She has been experiencing nocturia, which has been disruptive to her sleep.  Regarding her diabetes management, the patient has been on Mounjaro 12.5 mg twice a day. She recently increased the dose to 15 mg, which she reports has been well-tolerated with no stomach upset. She also reports a  recent consultation with a cardiologist who advised starting a statin due to elevated cholesterol levels and recommended a Mediterranean diet. The patient expresses some confusion about this advice, given her current dietary restrictions and the need for a high protein diet. She also expresses concern about the potential for weight gain if she is not able to adhere fully to the category two eating plan.          PHYSICAL EXAM:  Blood pressure 133/63, pulse 83, temperature 97.7 F (36.5 C), height 5\' 5"  (1.651 m), weight 231 lb (104.8 kg), last menstrual period 04/25/2013, SpO2 100%. Body mass index is 38.44 kg/m.  DIAGNOSTIC DATA REVIEWED:  BMET    Component Value Date/Time   NA 129 (L) 09/14/2023 1235   NA 133 (L) 07/14/2022 0934   K 4.4 09/14/2023 1235   CL 95 (L) 09/14/2023 1235   CO2 28 09/14/2023 1235   GLUCOSE 86 09/14/2023 1235   BUN 10 09/14/2023 1235   BUN 7 07/14/2022 0934   CREATININE 0.57 09/14/2023 1235   CREATININE 0.61 09/19/2020 0847   CALCIUM 8.9 09/14/2023 1235   GFRNONAA >60 07/20/2023 0553   GFRNONAA 106 09/19/2020 0847   GFRAA 123 09/19/2020 0847   Lab Results  Component Value Date   HGBA1C 5.2 07/13/2023   HGBA1C 6.1 (H) 10/27/2012   Lab Results  Component Value Date   INSULIN 41.2 (H) 07/14/2022   INSULIN 29.5 (H) 08/07/2021   Lab Results  Component Value Date   TSH  1.94 07/13/2023   CBC    Component Value Date/Time   WBC 9.8 07/20/2023 0553   RBC 4.30 07/20/2023 0553   HGB 12.7 07/20/2023 0553   HGB 13.2 08/07/2021 0000   HCT 38.5 07/20/2023 0553   HCT 40.2 08/07/2021 0000   PLT 266 07/20/2023 0553   PLT 284 08/07/2021 0000   MCV 89.5 07/20/2023 0553   MCV 89 08/07/2021 0000   MCH 29.5 07/20/2023 0553   MCHC 33.0 07/20/2023 0553   RDW 13.4 07/20/2023 0553   RDW 13.4 08/07/2021 0000   Iron Studies    Component Value Date/Time   IRON 54 10/06/2017 1505   TIBC 304 10/06/2017 1505   FERRITIN 151 (H) 10/06/2017 1505   IRONPCTSAT  18 10/06/2017 1505   Lipid Panel     Component Value Date/Time   CHOL 259 (H) 07/13/2023 0941   CHOL 216 (H) 07/14/2022 0934   TRIG 195.0 (H) 07/13/2023 0941   HDL 44.20 07/13/2023 0941   HDL 44 07/14/2022 0934   CHOLHDL 6 07/13/2023 0941   VLDL 39.0 07/13/2023 0941   LDLCALC 176 (H) 07/13/2023 0941   LDLCALC 139 (H) 07/14/2022 0934   LDLCALC 171 (H) 09/19/2020 0847   LDLDIRECT 145.0 07/18/2019 1410   Hepatic Function Panel     Component Value Date/Time   PROT 7.3 07/13/2023 0941   PROT 6.8 07/14/2022 0934   ALBUMIN 4.1 07/13/2023 0941   ALBUMIN 4.2 07/14/2022 0934   AST 19 07/13/2023 0941   ALT 21 07/13/2023 0941   ALKPHOS 55 07/13/2023 0941   BILITOT 0.3 07/13/2023 0941   BILITOT <0.2 07/14/2022 0934      Component Value Date/Time   TSH 1.94 07/13/2023 0941   Nutritional Lab Results  Component Value Date   VD25OH 76.34 07/13/2023   VD25OH 64.0 07/14/2022   VD25OH 60.0 12/28/2021     Assessment and Plan    Type 2 Diabetes Patient is on Mounjaro 12.5mg  twice daily. She has gained 2 pounds since the last visit and is not currently exercising due to fatigue. She is following her category 2 eating plan 75% of the time. -Increase Mounjaro to 15mg  twice daily. -Encourage patient to increase exercise when she is feeling better.  Obesity Patient has gained weight and is not currently exercising due to fatigue. She is following her category 2 eating plan 75% of the time. -Encourage patient to increase exercise when she is feeling better. -Modify category 2 eating plan to be more Mediterranean adjacent, focusing on lean proteins and increasing vegetable intake.  Hyponatremia Patient reports feeling drained and tired, which she attributes to low sodium levels. Recent sodium level was 129. -Advise patient to follow up with nephrologist to discuss options for managing hyponatremia.  Hyperlipidemia Patient was advised by another physician to start a statin due to  elevated cholesterol levels. -patient will continue her low cholesterol diet and work on increasing her vegetables.  Follow-up in 1 month to assess response to increased Mounjaro dose and discuss progress with dietary changes and exercise.        She was informed of the importance of frequent follow up visits to maximize her success with intensive lifestyle modifications for her multiple health conditions.    Quillian Quince, MD

## 2023-09-30 ENCOUNTER — Ambulatory Visit: Payer: 59

## 2023-10-01 ENCOUNTER — Encounter: Payer: Self-pay | Admitting: Cardiovascular Disease

## 2023-10-14 ENCOUNTER — Telehealth: Payer: Self-pay | Admitting: Family Medicine

## 2023-10-14 ENCOUNTER — Telehealth: Payer: Self-pay

## 2023-10-14 NOTE — Progress Notes (Unsigned)
   10/14/2023  Patient ID: Gloria [name], female   DOB: Aug 04, 1971, 52 y.o.   MRN: [id number]  Received the following email request from [facility] rep Gloria [name]:  I apologize to Dr. Salomon Fick for the multiple information requests, this patient's insurance office is making [place] jump through quite the hoops on this one.   I know your team already sent [place] the patient's clinical notes, which we submitted to the patient's insurance office. (Thank you for that!).  I need to request the following information as per the insurance office: Patient's medical records ([name], DOB 1971-03-21)  You can fax this information to our lab at [phone].   FYI - I spoke with the patient this morning, she understands the status and where things are in the process.

## 2023-10-14 NOTE — Telephone Encounter (Signed)
Calling about an authorization request for genetic testing--requesting a call back

## 2023-10-24 ENCOUNTER — Ambulatory Visit (INDEPENDENT_AMBULATORY_CARE_PROVIDER_SITE_OTHER): Payer: 59 | Admitting: Neurology

## 2023-10-24 ENCOUNTER — Telehealth: Payer: Self-pay | Admitting: Neurology

## 2023-10-24 VITALS — BP 141/67 | HR 65 | Ht 64.0 in | Wt 237.8 lb

## 2023-10-24 DIAGNOSIS — G4713 Recurrent hypersomnia: Secondary | ICD-10-CM

## 2023-10-24 DIAGNOSIS — R0683 Snoring: Secondary | ICD-10-CM | POA: Diagnosis not present

## 2023-10-24 DIAGNOSIS — G4733 Obstructive sleep apnea (adult) (pediatric): Secondary | ICD-10-CM

## 2023-10-24 DIAGNOSIS — R634 Abnormal weight loss: Secondary | ICD-10-CM | POA: Insufficient documentation

## 2023-10-24 NOTE — Progress Notes (Signed)
    Established Patient Office Visit  Subjective   Patient ID: Gloria [name], female    DOB: 03/08/1971  Age: 52 y.o. MRN: [id number]  Chief Complaint  Patient presents with   Follow-up    Patient in room #1 and alone. Patient states her cpap has been giving her trouble. Patient states she wakes up with headaches that turns into migraines.

## 2023-10-24 NOTE — Progress Notes (Signed)
Provider:  Melvyn Novas, MD  Primary Care Physician:  Deeann Saint, MD [address] [place] [address]     Referring Provider:         Chief Complaint according to patient   Patient presents with:     I breeze Patient (Initial Visit)     Dr Oswaldo Conroy patient originally referred in 2020 for a sleep study and since on CPAP>       HISTORY OF PRESENT ILLNESS:  Gloria [name] is a 52 y.o. female patient who is here for revisit 10/24/2023.  she had her last sleep study as a PSG 09-2020 and was free of PLM arousals. Her AHI was mild, 6.9/h and she continued to use CPAP ( SPLIT study in 2015 was her first study). Chief concern according to patient :  The machine says I get 7-8 hours of sleep and yet I fall asleep easily in A gain, have been sleepy. The dentist has told my teeth are more sensitive, my mouth is indeed dry, and in July I shared a cabin  on a cruise ship with my mother - and my masks leaks air-  reportedly, I was still snoring.  I am tired , have a lack of concentration." And: " I have lost about 50 pounds since my last sleep study".    "I can easily go to sleep, and usually stayed asleep but this has changed . I take lunesta."   I breeze download; The patient has used the machine with great compliance between November 1 and October 24, 2023.  This is a 97% compliance by days and 95% compliance by hours the total usage hours with 232.  On average 7-1/2 hours each night.  The minimum and maximum pressure settings at 6 and 12 cm of water with 1 cm expiratory relief.  The 95th percentile pressure and therapy is 10 cm water the air leak at the 95th percentile is 11 L/min.  Residual AHI is 0.6/h.  No central apneas have arisen.  The patient had 1 medical concerning event from the in August of this year and she was diagnosed with hyponatremia and a cause has thus far not been identified she has been referred to endocrinology.  She started taking  concentrated sodium but the suspect to cause hyponatremia is Trileptal which she takes at 600 mg twice daily and is prescribed to  control major depressive disorder. She has seen Dr. Tresa Endo her cardiologist and Dr. Dalbert Garnet her weight loss doctor.  He had in the past been on Nuvigil but did not tolerate it well and it actually caused more headaches.  She also reports some morning pressure headaches when she wakes up but those tend to go away without medication intake patient is currently on Ventolin, baby aspirin, Rexulti calcium carbonate and vitamin D.  Vitamin B complex, she measures blood glucose and blood pressure at home.          Review of Systems: Out of a complete 14 system review, the patient complains of only the following symptoms, and all other reviewed systems are negative.:  Fatigue, sleepiness , snoring,     How likely are you to doze in the following situations: 0 = not likely, 1 = slight chance, 2 = moderate chance, 3 = high chance   Sitting and Reading? Watching Television? Sitting inactive in a public place (theater or meeting)? As a passenger in a car for an hour without  a break? Lying down in the afternoon when circumstances permit? Sitting and talking to someone? Sitting quietly after lunch without alcohol? In a car, while stopped for a few minutes in traffic?   Total = 12/ 24 points   FSS endorsed at na/ 63 points.   Social History   Socioeconomic History   Marital status: Divorced    Spouse name: Not on file   Number of children: 1   Years of education: 13   Highest education level: Associate degree: academic program  Occupational History   Occupation: ACCOUNTING   Occupation: Armed forces operational officer  Tobacco Use   Smoking status: Former    Current packs/day: 0.00    Types: Cigarettes    Quit date: 01/26/2012    Years since quitting: 11.7   Smokeless tobacco: Never  Vaping Use   Vaping status: Never Used  Substance and Sexual Activity   Alcohol use: No     Alcohol/week: 0.0 standard drinks of alcohol    Comment: History of alcohol abuse, sober for 15 years in 2024   Drug use: No   Sexual activity: Not Currently    Partners: Male    Birth control/protection: Surgical, Abstinence    Comment: hysterectomy  Other Topics Concern   Not on file  Social History Narrative   Patient lives at home and works full time.   Education some college.   Right handed.   Caffeine: 2 cups daily.   Social Determinants of Health   Financial Resource Strain: Low Risk  (07/20/2023)   Overall Financial Resource Strain (CARDIA)    Difficulty of Paying Living Expenses: Not very hard  Food Insecurity: No Food Insecurity (07/20/2023)   Hunger Vital Sign    Worried About Running Out of Food in the Last Year: Never true    Ran Out of Food in the Last Year: Never true  Transportation Needs: No Transportation Needs (07/20/2023)   PRAPARE - Administrator, Civil Service (Medical): No    Lack of Transportation (Non-Medical): No  Physical Activity: Insufficiently Active (07/20/2023)   Exercise Vital Sign    Days of Exercise per Week: 2 days    Minutes of Exercise per Session: 20 min  Stress: No Stress Concern Present (07/20/2023)   [employer] of Occupational Health - Occupational Stress Questionnaire    Feeling of Stress : Only a little  Social Connections: Moderately Integrated (07/20/2023)   Social Connection and Isolation Panel [NHANES]    Frequency of Communication with Friends and Family: More than three times a week    Frequency of Social Gatherings with Friends and Family: More than three times a week    Attends Religious Services: More than 4 times per year    Active Member of [employer] or Organizations: Yes    Attends Engineer, structural: More than 4 times per year    Marital Status: Divorced    Family History  Problem Relation Age of Onset   Cancer Mother    Breast cancer Mother    Depression Mother    Anxiety disorder  Mother    Obesity Mother    Sleep apnea Mother    Obesity Father    Anxiety disorder Father    Cancer Father    Hyperlipidemia Father    Colon polyps Father    Diabetes Father    Cirrhosis Father    Liver cancer Father    Liver disease Father    Alcoholism Father    Depression Father  Alcohol abuse Father    Sleep apnea Father    Irritable bowel syndrome Sister    Sleep apnea Brother    Breast cancer Maternal Aunt    Alcoholism Maternal Aunt    Alcoholism Maternal Grandmother    Alcoholism Maternal Grandfather    Breast cancer Paternal Grandmother    Colon cancer Paternal Grandmother    Esophageal cancer Paternal Grandfather    Stomach cancer Paternal Grandfather    Alcoholism Paternal Grandfather    Rectal cancer Neg Hx     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   DDD (degenerative disc disease), lumbar    Depression    Diabetes (HCC)    Diverticulosis    Drug use    Elevated LFTs    Fibromyalgia    GERD (gastroesophageal reflux disease)    History of ETOH abuse    Hyperlipidemia    Hyperplastic colon polyp    Internal hemorrhoids    Migraines    NAFLD (nonalcoholic fatty liver disease)    Obese    Obstructive sleep apnea 02/25/2015   not diagnosed per pt 04-03-20   Osteoarthritis    lower back- DDD   Periodic limb movement disorder 02/25/2014   Pre-diabetes    pt taking metformin   RLS (restless legs syndrome)    Sleep apnea    Smoker    Snoring    Substance abuse (HCC)    Swallowing difficulty    Vitamin B12 deficiency    Vitamin D deficiency     Past Surgical History:  Procedure Laterality Date   CERVICAL FUSION      COLONOSCOPY     CYSTOSCOPY  06/06/2013   Procedure: CYSTOSCOPY;  Surgeon: Ok Edwards, MD;  Location: [facility];  Service: Gynecology;;   EXCISION METACARPAL MASS Right 09/03/2021   Procedure: EXCISION METACARPAL MASS RIGHT SMALL FINGER;  Surgeon: Cindee Salt, MD;  Location: [facility];  Service: Orthopedics;  Laterality: Right;   INTRAUTERINE DEVICE INSERTION  08/09/2008   PARAGUARD- removed   kidney surgery  1995   abcess   LAPAROSCOPY N/A 06/06/2013   Procedure: LAPAROSCOPY DIAGNOSTIC;  Surgeon: Ok Edwards, MD;  Location: [facility];  Service: Gynecology;  Laterality: N/A;   LAPAROTOMY  06/06/2013   Procedure: EXPLORATORY LAPAROTOMY;  Surgeon: Ok Edwards, MD;  Location: [facility];  Service: Gynecology;;   LIVER BIOPSY     partial right kideny  Right    radial tunnel Bilateral 2005   VAGINAL HYSTERECTOMY Left 06/06/2013   Procedure: Vaginal Hysterectomy with Patiial Right Salpingectomy;  Surgeon: Ok Edwards, MD;  Location: [facility];  Service: Gynecology;  Laterality: Left;     Current Outpatient Medications on File Prior to Visit  Medication Sig Dispense Refill   albuterol (VENTOLIN HFA) 108 (90 Base) MCG/ACT inhaler Inhale 2 puffs into the lungs every 4 (four) hours as needed for wheezing or shortness of breath.     aspirin EC 81 MG tablet Take 1 tablet (81 mg total) by mouth daily. Swallow  whole.     azelastine (ASTELIN) 0.1 % nasal spray Place 2 sprays into both nostrils 2 (two) times daily. 30 mL 12   B Complex Vitamins (B COMPLEX PO) Take 1 tablet by mouth daily.     blood glucose meter kit and supplies KIT Dispense based on patient and insurance preference. Use up to four times daily as directed. (FOR ICD-9 250.00, 250.01). 1 each 0   Blood Glucose Monitoring Suppl (ONETOUCH VERIO FLEX SYSTEM) w/Device KIT Use to check Blood sugars twice daily before meals 1 kit 0   Brexpiprazole (REXULTI) 4 MG TABS Take 4 mg by mouth daily.     Calcium Carbonate-Vitamin  D 600-400 MG-UNIT tablet Take 1 tablet by mouth daily.     carvedilol (COREG) 6.25 MG tablet TAKE 1 TABLET(6.25 MG) BY MOUTH TWICE DAILY WITH A MEAL 60 tablet 1   cetirizine (ZYRTEC) 10 MG tablet Take 10 mg by mouth daily.     Coenzyme Q10 (COQ10) 100 MG CAPS Take 100 mg by mouth daily.     D3-50 1.25 MG (50000 UT) capsule Take 50,000 Units by mouth once a week.     estradiol (VIVELLE-DOT) 0.05 MG/24HR patch Place 1 patch (0.05 mg total) onto the skin 2 (two) times a week. 24 patch 4   eszopiclone (LUNESTA) 2 MG TABS tablet Take 2 mg by mouth at bedtime.     fluticasone (CUTIVATE) 0.05 % cream Apply 1 Application topically 2 (two) times daily as needed (Behind ear/Behind belly flap).     fluticasone (FLONASE) 50 MCG/ACT nasal spray Place 2 sprays into both nostrils daily.     glucose blood (CONTOUR NEXT TEST) test strip Use to test blood glucose twice daily 100 each 5   hydrOXYzine (ATARAX) 10 MG tablet Take 20 mg by mouth daily as needed for anxiety.     ibuprofen (ADVIL) 800 MG tablet TAKE 1 TABLET(800 MG) BY MOUTH EVERY 8 HOURS AS NEEDED FOR MODERATE PAIN 30 tablet 3   Lancets (ONETOUCH DELICA PLUS LANCET30G) MISC USE 1  TO CHECK GLUCOSE 4 TIMES DAILY 100 each 0   LORazepam (ATIVAN) 0.5 MG tablet Take 0.5 mg by mouth daily as needed for anxiety.     Magnesium 500 MG CAPS Take 500 mg by mouth at bedtime.     Multiple Vitamins-Minerals (MULTIVITAMIN PO) Take 1 tablet by mouth daily.      OMEGA 3-6-9 FATTY ACIDS PO Take 500 mg by mouth daily.     oxcarbazepine (TRILEPTAL) 600 MG tablet Take 600 mg by mouth 2 (two) times daily.     rizatriptan (MAXALT) 10 MG tablet TAKE 1 TABLET BY MOUTH AS NEEDED FOR MIGRAINE 10 tablet 2   rosuvastatin (CRESTOR) 20 MG tablet Take 1 tablet (20 mg total) by mouth daily. 90 tablet 3   scopolamine (TRANSDERM-SCOP) 1 MG/3DAYS Place 1 patch onto the skin See admin instructions. Apply one patch every 72 hours as needed for travel.     sodium chloride 1 g tablet Take 1  tablet (1 g total) by mouth once as needed for up to 1 dose (symptomatic hyponatremia (fatigue, lethargy, confusion) or as directed by physician). 30 tablet 2   thiamine (VITAMIN B-1) 50 MG tablet Take 50 mg by mouth daily.     tirzepatide (MOUNJARO) 15 MG/0.5ML Pen Inject 15 mg into the skin once a week. 6 mL 0   vitamin C (ASCORBIC ACID) 500 MG tablet Take 500 mg by mouth daily.     VITAMIN E  PO Take 900 mg by mouth daily.     No current facility-administered medications on file prior to visit.    Allergies  Allergen Reactions   Benadryl [Diphenhydramine Hcl] Hives   Ropinirole Other (See Comments)   Levofloxacin Other (See Comments)   Aspirin    Codeine    Doxycycline    Lisinopril-Hydrochlorothiazide Other (See Comments)    Dry cough, dry eyes, insomnia, emotionally liable, increased thirst, brain fog, fatigue.   Metformin And Related     Blurred vision   Nuvigil [Armodafinil]     migraines    Olmesartan Other (See Comments)    May have contributed to hyponatremia.  Baseline sodium 131-133.   Requip [Ropinirole Hcl]     Increased headache   Semaglutide Other (See Comments)    Rybelsus caused depression and fibromyalgia flair.   Tramadol     Other reaction(s): Other (See Comments)   Wellbutrin [Bupropion]    Zolpidem      DIAGNOSTIC DATA (LABS, IMAGING, TESTING) - I reviewed patient records, labs, notes, testing and imaging myself where available.  Lab Results  Component Value Date   WBC 9.8 07/20/2023   HGB 12.7 07/20/2023   HCT 38.5 07/20/2023   MCV 89.5 07/20/2023   PLT 266 07/20/2023      Component Value Date/Time   NA 129 (L) 09/14/2023 1235   NA 133 (L) 07/14/2022 0934   K 4.4 09/14/2023 1235   CL 95 (L) 09/14/2023 1235   CO2 28 09/14/2023 1235   GLUCOSE 86 09/14/2023 1235   BUN 10 09/14/2023 1235   BUN 7 07/14/2022 0934   CREATININE 0.57 09/14/2023 1235   CREATININE 0.61 09/19/2020 0847   CALCIUM 8.9 09/14/2023 1235   PROT 7.3 07/13/2023 0941    PROT 6.8 07/14/2022 0934   ALBUMIN 4.1 07/13/2023 0941   ALBUMIN 4.2 07/14/2022 0934   AST 19 07/13/2023 0941   ALT 21 07/13/2023 0941   ALKPHOS 55 07/13/2023 0941   BILITOT 0.3 07/13/2023 0941   BILITOT <0.2 07/14/2022 0934   GFRNONAA >60 07/20/2023 0553   GFRNONAA 106 09/19/2020 0847   GFRAA 123 09/19/2020 0847   Lab Results  Component Value Date   CHOL 259 (H) 07/13/2023   HDL 44.20 07/13/2023   LDLCALC 176 (H) 07/13/2023   LDLDIRECT 145.0 07/18/2019   TRIG 195.0 (H) 07/13/2023   CHOLHDL 6 07/13/2023   Lab Results  Component Value Date   HGBA1C 5.2 07/13/2023   Lab Results  Component Value Date   VITAMINB12 608 08/07/2021   Lab Results  Component Value Date   TSH 1.94 07/13/2023    PHYSICAL EXAM:  Today's Vitals   10/24/23 0820  BP: (!) 141/67  Pulse: 65  Weight: 237 lb 12.8 oz (107.9 kg)  Height: 5\' 4"  (1.626 m)   Body mass index is 40.82 kg/m.   Wt Readings from Last 3 Encounters:  10/24/23 237 lb 12.8 oz (107.9 kg)  09/27/23 231 lb (104.8 kg)  09/26/23 237 lb (107.5 kg)     Ht Readings from Last 3 Encounters:  10/24/23 5\' 4"  (1.626 m)  09/27/23 5\' 5"  (1.651 m)  09/26/23 5' 4.5" (1.638 m)      General: The patient is awake, alert and appears not in acute distress. The patient is well groomed. Head: Normocephalic, atraumatic. Neck is supple. Mallampati 3  neck circumference:15.25 inches . Nasal airflow congested , septal deviation, right side is narrow-always.   Retrognathia is not seen.  Dental status: good.  Cardiovascular:  Regular rate and cardiac rhythm by pulse,  without distended neck veins. Respiratory: Lungs are clear to auscultation.  Skin:  Without evidence of ankle edema, or rash. Trunk: The patient's posture is erect.   NEUROLOGIC EXAM: The patient is awake and alert, oriented to place and time.   Memory subjective described as intact.  Attention span & concentration ability appears normal.  Speech is fluent,  without   dysarthria, dysphonia or aphasia.  Mood and affect are appropriate.   Cranial nerves: no loss of smell or taste reported  Pupils are equal and briskly reactive to light. Funduscopic exam def.  Extraocular movements in vertical and horizontal planes were intact and without nystagmus. No Diplopia. Visual fields by finger perimetry are intact. Hearing was intact to soft voice and finger rubbing.    Facial sensation intact to fine touch.  Facial motor strength is symmetric and tongue is midline.  Neck ROM : rotation, tilt and flexion extension were normal for age and shoulder shrug was symmetrical.    Motor exam:  Symmetric bulk, tone and ROM.   Normal tone without cog wheeling, symmetric grip strength .   Sensory:  Fine touch, pinprick and vibration were tested  and  normal.  Proprioception tested in the upper extremities was normal.   Coordination: Rapid alternating movements in the fingers/hands were of normal speed.  The Finger-to-nose maneuver was intact without evidence of ataxia, dysmetria or tremor.   Gait and station: Patient could rise unassisted from a seated position, walked without assistive device.  Toe and heel walk were deferred.  Deep tendon reflexes: in the  upper and lower extremities are symmetric and intact.  Babinski response was deferred.    ASSESSMENT AND PLAN 52 y.o. year old female  here with:   Recurrent daytime sleepiness while compliant on CPAP I breeze.  1) increasing fatigue and sleepiness again after CPAP initially working better for mild OSA.   2) The sleep pattern has become more fragmented again.   Plan for HST off CPAP. I prefer a watch pat.    This patient lost a significant amount of weight and may no longer have the AHI to require CPAP treatment.  I plan to follow up either personally or through our NP within 2-3 months.   I would like to thank Deeann Saint, MD  for allowing me to meet with and to take care of this pleasant patient.    CC: I will share my notes with Dr Nena Polio, and Dr Tresa Endo.   I also referred for dental device consultation as her apnea was mild and snoring is bother some. We will reduce Lunesta to 4 a week.  We are meeting ( can be virtually) after HST for future plans.     After spending a total time of 30  minutes face to face and additional time for physical and neurologic examination, review of laboratory studies,  personal review of imaging studies, reports and results of other testing and review of referral information / records as far as provided in visit,   Electronically signed by: Melvyn Novas, MD 10/24/2023 8:31 AM  [facility] Board certified by The [employer] of Sleep Medicine and Diplomate of the [employer] of Sleep Medicine. Board certified In Neurology through the ABPN, Fellow of the [employer] of Neu[employer].

## 2023-10-24 NOTE — Progress Notes (Deleted)
   Subjective:    Patient ID: Gloria [name], female    DOB: February 22, 1971, 52 y.o.   MRN: [id number]  HPI    Review of Systems     Objective:   Physical Exam        Assessment & Plan:

## 2023-10-24 NOTE — Telephone Encounter (Signed)
Referral for dentistry fax to [facility]. Phone: [phone], Fax: [phone]

## 2023-10-24 NOTE — Progress Notes (Signed)
    Established Patient Office Visit  Subjective   Patient ID: Gloria [name], female    DOB: 1971-05-06  Age: 52 y.o. MRN: [id number]  Chief Complaint  Patient presents with   Follow-up    Patient in room #1 and alone. Patient states her cpap has been giving her trouble. Patient states she wakes up with headaches that turns into migraines.    HPI    ROS    Objective:     BP (!) 141/67 (BP Location: Right Arm, Patient Position: Sitting, Cuff Size: Normal)   Pulse 65   Ht 5\' 4"  (1.626 m)   Wt 237 lb 12.8 oz (107.9 kg)   LMP 04/25/2013   BMI 40.82 kg/m    Physical Exam   No results found for any visits on 10/24/23.    The 10-year ASCVD risk score (Arnett DK, et al., 2019) is: 8.6%    Assessment & Plan:   Problem List Items Addressed This Visit     Snoring   Relevant Orders   Home sleep test   Ambulatory referral to Dentistry   OSA on CPAP   Relevant Orders   Home sleep test   Ambulatory referral to Dentistry   Recurrent hypersomnia - Primary   Relevant Orders   Home sleep test   Ambulatory referral to Dentistry   Drug-induced weight loss   Relevant Orders   Home sleep test   Ambulatory referral to Dentistry    No follow-ups on file.    Melvyn Novas, MD

## 2023-10-25 NOTE — Telephone Encounter (Signed)
Called and left a message to return call.

## 2023-10-28 ENCOUNTER — Ambulatory Visit
Admission: RE | Admit: 2023-10-28 | Discharge: 2023-10-28 | Disposition: A | Discharge status: Home / Self Care | Payer: 59 | Source: Ambulatory Visit | Attending: Family Medicine

## 2023-10-28 DIAGNOSIS — Z1231 Encounter for screening mammogram for malignant neoplasm of breast: Secondary | ICD-10-CM

## 2023-11-01 ENCOUNTER — Ambulatory Visit (INDEPENDENT_AMBULATORY_CARE_PROVIDER_SITE_OTHER): Payer: 59 | Admitting: Family Medicine

## 2023-11-01 ENCOUNTER — Other Ambulatory Visit: Payer: Self-pay | Admitting: Family Medicine

## 2023-11-01 ENCOUNTER — Encounter (INDEPENDENT_AMBULATORY_CARE_PROVIDER_SITE_OTHER): Payer: Self-pay | Admitting: Family Medicine

## 2023-11-01 VITALS — BP 148/91 | HR 64 | Temp 98.3°F | Ht 65.0 in | Wt 228.0 lb

## 2023-11-01 DIAGNOSIS — E119 Type 2 diabetes mellitus without complications: Secondary | ICD-10-CM | POA: Diagnosis not present

## 2023-11-01 DIAGNOSIS — I10 Essential (primary) hypertension: Secondary | ICD-10-CM

## 2023-11-01 DIAGNOSIS — E1169 Type 2 diabetes mellitus with other specified complication: Secondary | ICD-10-CM

## 2023-11-01 DIAGNOSIS — Z6837 Body mass index (BMI) 37.0-37.9, adult: Secondary | ICD-10-CM | POA: Diagnosis not present

## 2023-11-01 DIAGNOSIS — E669 Obesity, unspecified: Secondary | ICD-10-CM

## 2023-11-01 DIAGNOSIS — Z6838 Body mass index (BMI) 38.0-38.9, adult: Secondary | ICD-10-CM

## 2023-11-01 DIAGNOSIS — Z7985 Long-term (current) use of injectable non-insulin antidiabetic drugs: Secondary | ICD-10-CM

## 2023-11-01 MED ORDER — TIRZEPATIDE 15 MG/0.5ML ~~LOC~~ SOAJ: 15.0000 mg | SUBCUTANEOUS | 0 refills | Status: DC

## 2023-11-01 NOTE — Progress Notes (Signed)
.[facility]  Office: [phone]  /  Fax: [phone]  WEIGHT SUMMARY AND BIOMETRICS  Anthropometric Measurements Height: 5\' 5"  (1.651 m) Weight: 228 lb (103.4 kg) BMI (Calculated): 37.94 Weight at Last Visit: 231 lb Weight Lost Since Last Visit: 3 lb Weight Gained Since Last Visit: 0 Starting Weight: 274 lb Total Weight Loss (lbs): 46 lb (20.9 kg) Peak Weight: 289 lb   Body Composition  Body Fat %: 47.3 % Fat Mass (lbs): 108 lbs Muscle Mass (lbs): 114.4 lbs Total Body Water (lbs): 83.6 lbs Visceral Fat Rating : 14   Other Clinical Data Fasting: no Labs: no Today's Visit #: 71 Starting Date: 08/07/21    Chief Complaint: OBESITY   Discussed the use of AI scribe software for clinical note transcription with the patient, who gave verbal consent to proceed.  History of Present Illness   The patient, diagnosed with type two diabetes and obesity, presents for a routine follow-up and medication refill. She reports a weight loss of three pounds over the past month, despite the holiday season. She attributes this success to adherence to a category two eating plan approximately 80% of the time and regular exercise, including gym visits twice a week for 60-minute sessions.  The patient has recently started dating, which she believes has contributed to her motivation to improve her health. However, she notes that her new partner has a preference for sweets, which has presented some challenges. Despite this, the patient has managed to maintain control over her diet, even when eating out or around sweet foods. She has implemented a "two cookie rule" to manage her sweet intake.  The patient also reports a significant improvement in her clothing size, moving from a size 24 to a size 20. She expresses a positive outlook on her progress and a commitment to continue her weight loss journey. She also mentions a potential discontinuation of her CPAP machine for sleep apnea, as suggested by her  sleep specialist, due to a reduction in neck girth.  Overall, the patient demonstrates a proactive approach to her health, with a focus on diet, exercise, and positive lifestyle changes. She expresses satisfaction with her progress and a determination to continue on her current path.          PHYSICAL EXAM:  Blood pressure (!) 148/91, pulse 64, temperature 98.3 F (36.8 C), height 5\' 5"  (1.651 m), weight 228 lb (103.4 kg), last menstrual period 04/25/2013, SpO2 99%. Body mass index is 37.94 kg/m.  DIAGNOSTIC DATA REVIEWED:  BMET    Component Value Date/Time   NA 129 (L) 09/14/2023 1235   NA 133 (L) 07/14/2022 0934   K 4.4 09/14/2023 1235   CL 95 (L) 09/14/2023 1235   CO2 28 09/14/2023 1235   GLUCOSE 86 09/14/2023 1235   BUN 10 09/14/2023 1235   BUN 7 07/14/2022 0934   CREATININE 0.57 09/14/2023 1235   CREATININE 0.61 09/19/2020 0847   CALCIUM 8.9 09/14/2023 1235   GFRNONAA >60 07/20/2023 0553   GFRNONAA 106 09/19/2020 0847   GFRAA 123 09/19/2020 0847   Lab Results  Component Value Date   HGBA1C 5.2 07/13/2023   HGBA1C 6.1 (H) 10/27/2012   Lab Results  Component Value Date   INSULIN 41.2 (H) 07/14/2022   INSULIN 29.5 (H) 08/07/2021   Lab Results  Component Value Date   TSH 1.94 07/13/2023   CBC    Component Value Date/Time   WBC 9.8 07/20/2023 0553   RBC 4.30 07/20/2023 0553   HGB 12.7 07/20/2023  0553   HGB 13.2 08/07/2021 0000   HCT 38.5 07/20/2023 0553   HCT 40.2 08/07/2021 0000   PLT 266 07/20/2023 0553   PLT 284 08/07/2021 0000   MCV 89.5 07/20/2023 0553   MCV 89 08/07/2021 0000   MCH 29.5 07/20/2023 0553   MCHC 33.0 07/20/2023 0553   RDW 13.4 07/20/2023 0553   RDW 13.4 08/07/2021 0000   Iron Studies    Component Value Date/Time   IRON 54 10/06/2017 1505   TIBC 304 10/06/2017 1505   FERRITIN 151 (H) 10/06/2017 1505   IRONPCTSAT 18 10/06/2017 1505   Lipid Panel     Component Value Date/Time   CHOL 259 (H) 07/13/2023 0941   CHOL 216 (H)  07/14/2022 0934   TRIG 195.0 (H) 07/13/2023 0941   HDL 44.20 07/13/2023 0941   HDL 44 07/14/2022 0934   CHOLHDL 6 07/13/2023 0941   VLDL 39.0 07/13/2023 0941   LDLCALC 176 (H) 07/13/2023 0941   LDLCALC 139 (H) 07/14/2022 0934   LDLCALC 171 (H) 09/19/2020 0847   LDLDIRECT 145.0 07/18/2019 1410   Hepatic Function Panel     Component Value Date/Time   PROT 7.3 07/13/2023 0941   PROT 6.8 07/14/2022 0934   ALBUMIN 4.1 07/13/2023 0941   ALBUMIN 4.2 07/14/2022 0934   AST 19 07/13/2023 0941   ALT 21 07/13/2023 0941   ALKPHOS 55 07/13/2023 0941   BILITOT 0.3 07/13/2023 0941   BILITOT <0.2 07/14/2022 0934      Component Value Date/Time   TSH 1.94 07/13/2023 0941   Nutritional Lab Results  Component Value Date   VD25OH 76.34 07/13/2023   VD25OH 64.0 07/14/2022   VD25OH 60.0 12/28/2021     Assessment and Plan    Type 2 Diabetes Mellitus Type 2 diabetes mellitus, managed with Mounjaro 15 mg. Reports 3-pound weight loss over the last month, 80% adherence to a category two eating plan, and regular exercise (60 minutes, twice a week). Discussed the importance of continued adherence to the regimen and the benefits of sustained weight loss on glycemic control. Explained Mounjaro's role in weight loss and blood sugar control, with potential gastrointestinal side effects. Emphasized lifestyle changes in conjunction with medication. - Refill Mounjaro 15 mg - Continue current diet and exercise regimen - Follow up in one month  Obesity Obesity with recent weight loss and clothing size reduction from 24 to 20. Motivated by a new relationship and engaging in healthier lifestyle choices. Discussed avoiding sweets and maintaining portion control. Encouraged continuation of the current exercise routine and dietary plan. Explained that sustained weight loss can reduce the risk of comorbidities such as cardiovascular disease and improve overall quality of life. - Encourage continued adherence to  diet and exercise - Discuss the importance of avoiding sweets and maintaining portion control - Consider donating larger-sized clothing to reinforce weight loss progress  Obstructive Sleep Apnea Obstructive sleep apnea, with recent evaluation by Dr. Madelyn Flavors suggesting potential discontinuation of CPAP due to weight loss and reduced neck girth. Awaiting results of an at-home sleep study. Discussed that favorable sleep study results could lead to CPAP discontinuation, improving comfort and compliance. Explained that untreated sleep apnea can lead to cardiovascular issues and daytime fatigue. - Await results of at-home sleep study - If results are favorable, discontinue CPAP  HTN Discussed the importance of regular follow-up appointments, blood pressure monitoring, routine screenings, and preventive health measures. - Check blood pressure at home - Schedule follow-up appointments for the beginning of the year  Follow-up - Follow up in one month - Schedule additional appointments for the beginning of the year.        She was informed of the importance of frequent follow up visits to maximize her success with intensive lifestyle modifications for her multiple health conditions.    Quillian Quince, MD

## 2023-11-02 ENCOUNTER — Ambulatory Visit: Payer: 59 | Admitting: Internal Medicine

## 2023-11-02 ENCOUNTER — Other Ambulatory Visit: Payer: Self-pay | Admitting: Family Medicine

## 2023-11-02 ENCOUNTER — Other Ambulatory Visit: Payer: Self-pay | Admitting: Internal Medicine

## 2023-11-02 ENCOUNTER — Ambulatory Visit: Payer: 59 | Admitting: Neurology

## 2023-11-02 ENCOUNTER — Encounter: Payer: Self-pay | Admitting: Internal Medicine

## 2023-11-02 VITALS — BP 122/60 | HR 69 | Ht 65.0 in | Wt 229.8 lb

## 2023-11-02 DIAGNOSIS — G4713 Recurrent hypersomnia: Secondary | ICD-10-CM

## 2023-11-02 DIAGNOSIS — R634 Abnormal weight loss: Secondary | ICD-10-CM

## 2023-11-02 DIAGNOSIS — R0683 Snoring: Secondary | ICD-10-CM

## 2023-11-02 DIAGNOSIS — E871 Hypo-osmolality and hyponatremia: Secondary | ICD-10-CM

## 2023-11-02 DIAGNOSIS — G4733 Obstructive sleep apnea (adult) (pediatric): Secondary | ICD-10-CM

## 2023-11-02 DIAGNOSIS — I10 Essential (primary) hypertension: Secondary | ICD-10-CM

## 2023-11-02 NOTE — Patient Instructions (Signed)
Our working diagnosis is Trileptal-induced hyponatremia.  Please stop at the lab.  We will have you back if the above investigation points towards an endocrine disease.

## 2023-11-02 NOTE — Progress Notes (Addendum)
Patient ID: Gloria [name], female   DOB: September 12, 1971, 52 y.o.   MRN: [id number]  HPI  Gloria [name] is a 52 y.o.-year-old female, referred by Dr. Dalbert Garnet, for evaluation for type 2 diabetes, however, reviewing the chart, the referral was intended to be for hyponatremia.  Pt has been dx with hyponatremia almost 2 years ago.  However, more recently he had lower sodium levels and had to be admitted for these.  Lowest level was 124 in 06/2023.  She was referred to endocrinology but also to nephrology - saw Dr. Marisue Humble -consult notes reviewed.  I reviewed pt's recent sodium levels:  Lab Results  Component Value Date   NA 129 (L) 09/14/2023   NA 129 (L) 07/29/2023   NA 127 (L) 07/26/2023   NA 132 (L) 07/20/2023   NA 129 (L) 07/19/2023   NA 125 (L) 07/19/2023   NA 124 (L) 07/18/2023   NA 129 (L) 07/13/2023   NA 129 (L) 07/13/2023   NA 129 (L) 07/10/2023   NA 128 (L) 07/09/2023   NA 127 (L) 07/09/2023   NA 133 (L) 07/14/2022   NA 131 (L) 12/28/2021   NA 138 08/07/2021   NA 136 09/19/2020   NA 137 07/21/2020   NA 140 04/20/2020   NA 138 07/18/2019   NA 140 10/06/2017   NA 137 12/30/2016   NA 137 11/24/2016   NA 136 09/03/2016   NA 139 08/28/2014   NA 137 10/17/2013   NA 137 10/12/2013   NA 138 06/07/2013   NA 135 06/05/2013   NA 133 (L) 01/23/2013   NA 131 (L) 11/30/2012    Other pertinent labs reviewed: Component     Latest Ref Rng 07/09/2023 07/18/2023  Osmolality, Urine     300 - 900 mOsm/kg 414  415   B Natriuretic Peptide     0.0 - 100.0 pg/mL 26.1    Osmolality     275 - 295 mOsm/kg 271 (L)  260 (L)   Sodium, Urine     mmol/L  26    Of note, patient is on oxcarbazepine (started 10/2021) and brexpiprazole (started before Trileptal per her recall).  She tried many medications in the past but none of them worked as well as this regimen.  Per review of the chart, her hyponatremia was associated with mild hypochloremia or but not alkalosis; she had a urine specific  gravity of 1.018 at the last check, previously 1.014 and 1.020.  Of note, she does not have a history of cancer.  A recent chest x-ray did not show any concerning masses. She does have an extensive family history of cancer: M and M aunt had BrCA. M aunt had ThyCA. MGM had colon CA. PGF had stomach and liver CA. F with liver CA.  Pt drinks 32 oz fluids a day, but was previously drinking more, 64 oz a day - the amount was cut in half 2-3 mo ago by nephrology (Dr. Marisue Humble).    No swelling in legs or arms.  No CHF, CKD.  No significant hypertrigyceridemia.  Lab Results  Component Value Date   BUN 10 09/14/2023   Lab Results  Component Value Date   CREATININE 0.57 09/14/2023   Lab Results  Component Value Date   CHOL 259 (H) 07/13/2023   HDL 44.20 07/13/2023   LDLCALC 176 (H) 07/13/2023   LDLDIRECT 145.0 07/18/2019   TRIG 195.0 (H) 07/13/2023   CHOLHDL 6 07/13/2023   LFTs were normal: Lab Results  Component Value Date   ALT 21 07/13/2023   AST 19 07/13/2023   ALKPHOS 55 07/13/2023   BILITOT 0.3 07/13/2023   Thyroid tests have been normal: Lab Results  Component Value Date   TSH 1.94 07/13/2023   TSH 2.860 08/07/2021   TSH 2.24 09/19/2020   TSH 2.400 10/06/2017   TSH 1.75 09/03/2016   TSH 1.584 08/28/2014   TSH 2.058 10/17/2013   TSH 1.514 10/12/2013   TSH 2.750 10/27/2012   Pt does not smoke, quit in 2013 - smoked 1.5 PPD - started at 52 y/o. She quit drinking in 2009 - at that time she had stage 2 cirrhosis >> reversed - now fatty liver (Dr. Rhea Belton).  + HAs, + dizziness (room tilting), + blurry vision.   Pt. also has a history of diabetes and is on Mounjaro.  Latest HbA1c was excellent, at 5.2%.  She gets back steroid inj q6 mo >> Dr. Ethelene Hal.  These are greatly helping.  She is not exercising excessively as she has significant fatigue.   She has fibromyalgia.   She has OSA and is on CPAP.  She also has an overactive bladder and has nocturia.    She has  hyperlipidemia and she recently saw cardiology and was recommended to start a statin (Crestor 20 mg daily) and  Mediterranean diet.  She has a history of kidney surgery 1995.  A surgical clip has been left in place which is still visible on send imaging tests.  She has no history of osteoporosis.  She has DDD.  She also has vitamin D deficiency and is on supplementation.  ROS: Constitutional: + Weight loss, + decreased appetite, no fatigue, no subjective hyperthermia/hypothermia Eyes: + blurry vision, no xerophthalmia ENT: no sore throat, no nodules palpated in throat, no dysphagia/odynophagia, no hoarseness Cardiovascular: no CP/SOB/palpitations/leg swelling Respiratory: no cough/SOB Gastrointestinal: no N/V/D/C Musculoskeletal: no muscle/joint aches Skin: no rashes Neurological: no tremors/numbness/tingling/dizziness, + headaches Psychiatric: + Both: depression/anxiety  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   DDD (degenerative disc disease), lumbar    Depression    Diabetes (HCC)    Diverticulosis    Drug use    Elevated LFTs    Fibromyalgia    GERD (gastroesophageal reflux disease)    History of ETOH abuse    Hyperlipidemia    Hyperplastic colon polyp    Internal hemorrhoids    Migraines    NAFLD (nonalcoholic fatty liver disease)    Obese    Obstructive sleep apnea 02/25/2015   not diagnosed per pt 04-03-20   Osteoarthritis    lower back- DDD   Periodic limb movement disorder 02/25/2014   Pre-diabetes    pt taking metformin   RLS (restless legs syndrome)    Sleep apnea    Smoker    Snoring    Substance abuse (HCC)    Swallowing difficulty     Vitamin B12 deficiency    Vitamin D deficiency    Past Surgical History:  Procedure Laterality Date   CERVICAL FUSION  COLONOSCOPY     CYSTOSCOPY  06/06/2013   Procedure: CYSTOSCOPY;  Surgeon: Ok Edwards, MD;  Location: [facility];  Service: Gynecology;;   EXCISION METACARPAL MASS Right 09/03/2021   Procedure: EXCISION METACARPAL MASS RIGHT SMALL FINGER;  Surgeon: Cindee Salt, MD;  Location: [facility];  Service: Orthopedics;  Laterality: Right;   INTRAUTERINE DEVICE INSERTION  08/09/2008   PARAGUARD- removed   kidney surgery  1995   abcess   LAPAROSCOPY N/A 06/06/2013   Procedure: LAPAROSCOPY DIAGNOSTIC;  Surgeon: Ok Edwards, MD;  Location: [facility];  Service: Gynecology;  Laterality: N/A;   LAPAROTOMY  06/06/2013   Procedure: EXPLORATORY LAPAROTOMY;  Surgeon: Ok Edwards, MD;  Location: [facility];  Service: Gynecology;;   LIVER BIOPSY     partial right kideny  Right    radial tunnel Bilateral 2005   VAGINAL HYSTERECTOMY Left 06/06/2013   Procedure: Vaginal Hysterectomy with Patiial Right Salpingectomy;  Surgeon: Ok Edwards, MD;  Location: [facility];  Service: Gynecology;  Laterality: Left;   Social History   Socioeconomic History   Marital status: Divorced    Spouse name: Not on file   Number of children: 1   Years of education: 13   Highest education level: Associate degree: academic program  Occupational History   Occupation: ACCOUNTING   Occupation: Armed forces operational officer  Tobacco Use   Smoking status: Former    Current packs/day: 0.00    Types: Cigarettes    Quit date: 01/26/2012    Years since quitting: 11.7   Smokeless tobacco: Never  Vaping Use   Vaping status: Never Used  Substance and Sexual Activity   Alcohol use: No    Alcohol/week: 0.0 standard drinks of alcohol    Comment: History of alcohol abuse   Drug use: No   Sexual activity: Not Currently    Partners: Male    Birth control/protection: Surgical, Abstinence    Comment:  hysterectomy  Other Topics Concern   Not on file  Social History Narrative   Patient lives at home and works full time.   Education some college.   Right handed.   Caffeine: 2 cups daily.   Social Determinants of Health   Financial Resource Strain: Low Risk  (07/20/2023)   Overall Financial Resource Strain (CARDIA)    Difficulty of Paying Living Expenses: Not very hard  Food Insecurity: No Food Insecurity (07/20/2023)   Hunger Vital Sign    Worried About Running Out of Food in the Last Year: Never true    Ran Out of Food in the Last Year: Never true  Transportation Needs: No Transportation Needs (07/20/2023)   PRAPARE - Administrator, Civil Service (Medical): No    Lack of Transportation (Non-Medical): No  Physical Activity: Insufficiently Active (07/20/2023)   Exercise Vital Sign    Days of Exercise per Week: 2 days    Minutes of Exercise per Session: 20 min  Stress: No Stress Concern Present (07/20/2023)   [employer] of Occupational Health - Occupational Stress Questionnaire    Feeling of Stress : Only a little  Social Connections: Moderately Integrated (07/20/2023)   Social Connection and Isolation Panel [NHANES]    Frequency of Communication with Friends and Family: More than three times a week    Frequency of Social Gatherings with Friends and Family: More than three times a week    Attends Religious Services: More than 4 times per year    Active Member of [employer] or Organizations:  Yes    Attends Club or Organization Meetings: More than 4 times per year    Marital Status: Divorced  Intimate Partner Violence: Not At Risk (07/19/2023)   Humiliation, Afraid, Rape, and Kick questionnaire    Fear of Current or Ex-Partner: No    Emotionally Abused: No    Physically Abused: No    Sexually Abused: No   Current Outpatient Medications on File Prior to Visit  Medication Sig Dispense Refill   albuterol (VENTOLIN HFA) 108 (90 Base) MCG/ACT inhaler Inhale 2 puffs into  the lungs every 4 (four) hours as needed for wheezing or shortness of breath.     aspirin EC 81 MG tablet Take 1 tablet (81 mg total) by mouth daily. Swallow whole.     azelastine (ASTELIN) 0.1 % nasal spray Place 2 sprays into both nostrils 2 (two) times daily. 30 mL 12   B Complex Vitamins (B COMPLEX PO) Take 1 tablet by mouth daily.     blood glucose meter kit and supplies KIT Dispense based on patient and insurance preference. Use up to four times daily as directed. (FOR ICD-9 250.00, 250.01). 1 each 0   Blood Glucose Monitoring Suppl (ONETOUCH VERIO FLEX SYSTEM) w/Device KIT Use to check Blood sugars twice daily before meals 1 kit 0   Brexpiprazole (REXULTI) 4 MG TABS Take 4 mg by mouth daily.     Calcium Carbonate-Vitamin D 600-400 MG-UNIT tablet Take 1 tablet by mouth daily.     carvedilol (COREG) 6.25 MG tablet TAKE 1 TABLET(6.25 MG) BY MOUTH TWICE DAILY WITH A MEAL 60 tablet 1   cetirizine (ZYRTEC) 10 MG tablet Take 10 mg by mouth daily.     Coenzyme Q10 (COQ10) 100 MG CAPS Take 100 mg by mouth daily.     D3-50 1.25 MG (50000 UT) capsule Take 50,000 Units by mouth once a week.     estradiol (VIVELLE-DOT) 0.05 MG/24HR patch Place 1 patch (0.05 mg total) onto the skin 2 (two) times a week. 24 patch 4   eszopiclone (LUNESTA) 2 MG TABS tablet Take 2 mg by mouth at bedtime.     fluticasone (CUTIVATE) 0.05 % cream Apply 1 Application topically 2 (two) times daily as needed (Behind ear/Behind belly flap).     fluticasone (FLONASE) 50 MCG/ACT nasal spray Place 2 sprays into both nostrils daily.     glucose blood (CONTOUR NEXT TEST) test strip Use to test blood glucose twice daily 100 each 5   hydrOXYzine (ATARAX) 10 MG tablet Take 20 mg by mouth daily as needed for anxiety.     ibuprofen (ADVIL) 800 MG tablet TAKE 1 TABLET(800 MG) BY MOUTH EVERY 8 HOURS AS NEEDED FOR MODERATE PAIN 30 tablet 3   Lancets (ONETOUCH DELICA PLUS LANCET30G) MISC USE 1  TO CHECK GLUCOSE 4 TIMES DAILY 100 each 0    LORazepam (ATIVAN) 0.5 MG tablet Take 0.5 mg by mouth daily as needed for anxiety.     Magnesium 500 MG CAPS Take 500 mg by mouth at bedtime.     Multiple Vitamins-Minerals (MULTIVITAMIN PO) Take 1 tablet by mouth daily.      OMEGA 3-6-9 FATTY ACIDS PO Take 500 mg by mouth daily.     oxcarbazepine (TRILEPTAL) 600 MG tablet Take 600 mg by mouth 2 (two) times daily.     rizatriptan (MAXALT) 10 MG tablet TAKE 1 TABLET BY MOUTH AS NEEDED FOR MIGRAINE 10 tablet 2   rosuvastatin (CRESTOR) 20 MG tablet Take 1 tablet (20 mg  total) by mouth daily. 90 tablet 3   scopolamine (TRANSDERM-SCOP) 1 MG/3DAYS Place 1 patch onto the skin See admin instructions. Apply one patch every 72 hours as needed for travel.     sodium chloride 1 g tablet Take 1 tablet (1 g total) by mouth once as needed for up to 1 dose (symptomatic hyponatremia (fatigue, lethargy, confusion) or as directed by physician). 30 tablet 2   thiamine (VITAMIN B-1) 50 MG tablet Take 50 mg by mouth daily.     tirzepatide (MOUNJARO) 15 MG/0.5ML Pen Inject 15 mg into the skin once a week. 6 mL 0   vitamin C (ASCORBIC ACID) 500 MG tablet Take 500 mg by mouth daily.     VITAMIN E PO Take 900 mg by mouth daily.     No current facility-administered medications on file prior to visit.   Allergies  Allergen Reactions   Benadryl [Diphenhydramine Hcl] Hives   Ropinirole Other (See Comments)   Levofloxacin Other (See Comments)   Aspirin    Codeine    Doxycycline    Lisinopril-Hydrochlorothiazide Other (See Comments)    Dry cough, dry eyes, insomnia, emotionally liable, increased thirst, brain fog, fatigue.   Metformin And Related     Blurred vision   Nuvigil [Armodafinil]     migraines    Olmesartan Other (See Comments)    May have contributed to hyponatremia.  Baseline sodium 131-133.   Requip [Ropinirole Hcl]     Increased headache   Semaglutide Other (See Comments)    Rybelsus caused depression and fibromyalgia flair.   Tramadol     Other  reaction(s): Other (See Comments)   Wellbutrin [Bupropion]    Zolpidem    Family History  Problem Relation Age of Onset   Cancer Mother    Breast cancer Mother    Depression Mother    Anxiety disorder Mother    Obesity Mother    Sleep apnea Mother    Obesity Father    Anxiety disorder Father    Cancer Father    Hyperlipidemia Father    Colon polyps Father    Diabetes Father    Cirrhosis Father    Liver cancer Father    Liver disease Father    Alcoholism Father    Depression Father    Alcohol abuse Father    Sleep apnea Father    Irritable bowel syndrome Sister    Sleep apnea Brother    Breast cancer Maternal Aunt    Alcoholism Maternal Aunt    Alcoholism Maternal Grandmother    Alcoholism Maternal Grandfather    Breast cancer Paternal Grandmother    Colon cancer Paternal Grandmother    Esophageal cancer Paternal Grandfather    Stomach cancer Paternal Grandfather    Alcoholism Paternal Grandfather    Rectal cancer Neg Hx    PE: BP 122/60   Pulse 69   Ht 5\' 5"  (1.651 m)   Wt 229 lb 12.8 oz (104.2 kg)   LMP 04/25/2013   SpO2 97%   BMI 38.24 kg/m  Wt Readings from Last 20 Encounters:  11/02/23 229 lb 12.8 oz (104.2 kg)  11/01/23 228 lb (103.4 kg)  10/24/23 237 lb 12.8 oz (107.9 kg)  09/27/23 231 lb (104.8 kg)  09/26/23 237 lb (107.5 kg)  09/06/23 229 lb (103.9 kg)  08/16/23 233 lb (105.7 kg)  07/29/23 232 lb 6.4 oz (105.4 kg)  07/28/23 228 lb (103.4 kg)  07/21/23 231 lb 12.8 oz (105.1 kg)  07/19/23 237 lb  14 oz (107.9 kg)  07/13/23 231 lb 6.4 oz (105 kg)  07/11/23 231 lb 9.6 oz (105.1 kg)  07/09/23 234 lb (106.1 kg)  06/21/23 235 lb (106.6 kg)  05/24/23 234 lb (106.1 kg)  05/11/23 239 lb 12.8 oz (108.8 kg)  05/03/23 240 lb (108.9 kg)  04/05/23 235 lb (106.6 kg)  03/01/23 240 lb (108.9 kg)   Constitutional: overweight, in NAD Eyes:  EOMI, no exophthalmos ENT: no neck masses, no cervical lymphadenopathy Cardiovascular: RRR, No MRG Respiratory: CTA  B Musculoskeletal: no deformities Skin:no rashes Neurological: no tremor with outstretched hands  ASSESSMENT: 1. Hyponatremia Patient with  hyponatremia developed in the last 2 years. Sodium has fluctuated between 124 and 133.  Her sodium was actually normal, at 138 in 07/2021 and she started Trileptal in the following 1 to 2 months.  She started to feel poorly, with dizziness, headaches, and more fatigue afterwards.  Depression improved, however.  Sodium level obtained in 12/2021 was low, at 131.  However, afterwards, sodium worsened down to 124 in 06/2023.  She had to be admitted for this. - few months ago, she was referred to nephrology and endocrinology.  She did see Dr. Marisue Humble recently who considered her hyponatremia related to SIADH in the setting of Trileptal use.  He also suggested to reduce her fluid intake from 64 to 32 ounces daily.  She was able to do so.  Subsequent sodium level was better, only mildly low, at 132.  She has no problems with the fluid restriction.  She was recently started on sodium tablets by PCP, a low dose, 1 g a day.  She is also trying to add salt to her diet.  She does not have high blood pressure at today's visit blood pressure 122/60.-  - No indication of pseudohyponatremia (no significantly increased triglycerides or proteins), dilutional hyponatremia (hypervolemia, hyperglycemia).  - Also, she does not have renal disease (normal BUN and creatinine, ACR), decompensated cirrhosis (but she does not have a history of alcoholic cirrhosis, which improved after stopping alcohol in 2009, normal LFTs, now hepatic steatosis - followed by Dr. Rhea Belton), CHF (normal BNP recently, 26, normal 2D ECHO 09/2023) - She had a serum osmolality of 271, then 260 in 06/2023.  This is lower than 280, confirming hypoosmolar hyponatremia.  Urine sodium was lower than 30, and she also had hypochloremia, both seen in the context of: Diuretic use (however, she is not on diuretics, she was on  HCTZ only for 1 day before her hospitalization this summer) Diarrhea or vomiting (denies) Other losses (e.g. burns -denies) Acute water overload (she is currently not drinking water excessively, but was drinking much more before seeing nephrology) -We did discuss that possible endocrine causes of hyponatremia are: Hypothyroidism (recent TSH was normal) Adrenal insufficiency (we will check a cortisol and ACTH today -she does have a history of steroid injections in her back approximately every 6 months we discussed about trying to reduce these as much as possible to avoid iatrogenically induced adrenal insufficiency) SIADH/reset thermostat (sodium is usually higher than 30 in this condition, which is a diagnosis of exclusion).  It is possible the patient was also dehydrated when the urine sodium was checked, which could have affected the urine sodium.  However, in the setting of dehydration, the BUN would have expected to be higher than 20 and hers was 6-10. -we discussed that certain drugs can cause hyponatremia.  In her case, in fact, she is on 2 possible culprits: Oxcarbazepine (Trileptal) and Brexpiprazole (  Rexulti).  Unfortunately, she has tried many other psychotropic medications without good results and she does not think that she could come off Trileptal.  In that case, she will need to continue Trileptal.  It appears that her sodium improved after fluid restriction and she can continue with this.  We discussed that she could increase the number of salt tablets that she gets in a day in case the sodium starts dropping again.  I also advised her to take the tablets with meals. -At today's visit, I plan to check the following labs: BMP (sodium, creatinine, BUN), urine osmolality, sodium, and potassium, serum osmolality, AVP, uric acid, ACTH, and cortisol. - I will see the patient back if labs are abnormal   Patient Instructions  Our working diagnosis is Trileptal-induced hyponatremia.  Please stop  at the lab.  We will have you back if the above investigation points towards an endocrine disease.  Orders Placed This Encounter  Procedures   ACTH   Arginine vasopressin hormone   Basic metabolic panel   Cortisol   Osmolality   Osmolality, urine   Potassium, urine, random   Sodium, urine, random   Uric Acid   - Total time spent for the visit: 1 hour, in precharting, post charting, obtaining medical information from Epic nad Cre Everywhere and from the patient, reviewing previous PCP and consultant notes, labs, imaging evaluations, and treatments, reviewing her symptoms, counseling her about her hyponatremia (please see the discussed topics above), and developing a plan to further investigate and treat it; she had a number of questions which I addressed.    Component     Latest Ref Rng 11/02/2023  Sodium     134 - 144 mmol/L 129 (L)   Potassium     3.5 - 5.2 mmol/L 4.4   Chloride     96 - 106 mmol/L 92 (L)   CO2     20 - 29 mmol/L 23   Glucose     70 - 99 mg/dL 89   BUN     6 - 24 mg/dL 10   Creatinine     4.09 - 1.00 mg/dL 8.11   Calcium     8.7 - 10.2 mg/dL 9.4   BUN/Creatinine Ratio     9 - 23  16   eGFR     >59 mL/min/1.73 106   Osmolality, Urine     mOsmol/kg 573   Sodium, Urine     Not Estab. mmol/L 69   ACTH     7.2 - 63.3 pg/mL 22.4   ADH WILL FOLLOW (P)  ADH WILL FOLLOW (P)  Osmolality Meas     275 - 295 mOsmol/kg 260 (L)   Osmolality Meas      260 (L)   Cortisol     6.2 - 19.4 ug/dL 91.4   Potassium Urine     Not Estab. mmol/L 44.6   Uric Acid     3.0 - 7.2 mg/dL 3.1    Component     Latest Ref Rng 11/02/2023  ADH     0.0 - 4.7 pg/mL <0.8   Osmolality Meas     275 - 295 mOsmol/kg 260 (L)    ACTH and cortisol levels are normal, ruling out adrenal insufficiency. Urine sodium is now higher than 30, BUN is 10, uric acid is also low, all pointing towards euvolemic, rather than hypovolemic hyponatremia. ADH returned suppressed, but this does not  rule out SIADH. At this point, the above labs  and previous investigation point towards SIADH most likely due to oxcarbazepine. The main treatment for this condition is fluid restriction.  She may need to restrict fluid even more, the recommended target is [phone] mL fluids daily.  If this is not possible, increasing salt tablets to 1 to 2 tablets with 1 or 2 meals a day, would be the next step.  Demeclocycline can also be used but this can be conducive to kidney dysfunction.  Urea is another option.  Will discuss with the patient.  I will have her return in 1.5-2 months for another visit.  Carlus Pavlov, MD PhD [facility] Endocrinology

## 2023-11-07 ENCOUNTER — Ambulatory Visit (HOSPITAL_BASED_OUTPATIENT_CLINIC_OR_DEPARTMENT_OTHER)
Admission: RE | Admit: 2023-11-07 | Discharge: 2023-11-07 | Disposition: A | Discharge status: Home / Self Care | Payer: Self-pay | Source: Ambulatory Visit | Attending: Cardiovascular Disease | Admitting: Cardiovascular Disease

## 2023-11-07 DIAGNOSIS — I10 Essential (primary) hypertension: Secondary | ICD-10-CM | POA: Insufficient documentation

## 2023-11-07 DIAGNOSIS — E782 Mixed hyperlipidemia: Secondary | ICD-10-CM | POA: Insufficient documentation

## 2023-11-07 DIAGNOSIS — R011 Cardiac murmur, unspecified: Secondary | ICD-10-CM | POA: Insufficient documentation

## 2023-11-08 ENCOUNTER — Encounter: Payer: Self-pay | Admitting: Neurology

## 2023-11-10 LAB — BASIC METABOLIC PANEL
BUN/Creatinine Ratio: 16 (ref 9–23)
BUN: 10 mg/dL (ref 6–24)
CO2: 23 mmol/L (ref 20–29)
Calcium: 9.4 mg/dL (ref 8.7–10.2)
Chloride: 92 mmol/L — ABNORMAL LOW (ref 96–106)
Creatinine, Ser: 0.63 mg/dL (ref 0.57–1.00)
Glucose: 89 mg/dL (ref 70–99)
Potassium: 4.4 mmol/L (ref 3.5–5.2)
Sodium: 129 mmol/L — ABNORMAL LOW (ref 134–144)
eGFR: 106 mL/min/{1.73_m2} (ref >59)

## 2023-11-10 LAB — OSMOLALITY, URINE: Osmolality, Ur: 573 mosm/kg

## 2023-11-10 LAB — CORTISOL: Cortisol: 16.7 ug/dL (ref 6.2–19.4)

## 2023-11-10 LAB — ARGININE VASOPRESSIN HORMONE
ADH: <0.8 pg/mL (ref 0.0–4.7)
Osmolality Meas: 260 mosm/kg — ABNORMAL LOW (ref 275–295)
Osmolality Meas: 260 mosm/kg — ABNORMAL LOW (ref 275–295)

## 2023-11-10 LAB — POTASSIUM, URINE, RANDOM: Potassium Urine: 44.6 mmol/L

## 2023-11-10 LAB — ACTH: ACTH: 22.4 pg/mL (ref 7.2–63.3)

## 2023-11-10 LAB — SODIUM, URINE, RANDOM: Sodium, Ur: 69 mmol/L

## 2023-11-10 LAB — URIC ACID: Uric Acid: 3.1 mg/dL (ref 3.0–7.2)

## 2023-11-10 NOTE — Progress Notes (Signed)
[facility] [place] Female, 52 years old, date of birth [birth year]   HOME SLEEP TEST REPORT ( by Watch PAT)   STUDY DATE:  11-10-2023 ( mail out test data loaded)    ORDERING CLINICIAN: Melvyn Novas, MD  REFERRING CLINICIAN: PCP    INFORMATION/HISTORY: 10-24-2023 visit , this patient was originally referred by Dr. Anne Hahn in 2020 but had been tested for sleep apnea as early as 2015. Her AHI was 6.9/h , mild OSA and she has for now continued to use CPAP, using an I Breeze machine since 2021 - download is posted in RV note.   She has a history of cervical fusion. She presents with concerns of  persistent hypersomnia, and reports that her mother noted her to snore on a recent cruise vacation. Since last sleep test, she has lost weight, about 50 lbs , needs retesting.    Epworth sleepiness score: 12/24. FSS : na. 63 points.    BMI: 40.8 kg/m   Neck Circumference: 15.25 "   FINDINGS:   Sleep Summary:   Total Recording Time (hours, min):    7 hours and 20 minutes     Total Sleep Time (hours, min):     6 h and 49 m             Percent REM (%):    27.6%                                    Respiratory Indices:   Calculated pAHI (per hour):     0.7/h                         REM pAHI : 1.6 /h   NREM pAHI:   0.4/h                            Supine AHI:  100% of sleep time   Very little snoring - see graph on attached table.                                                  Oxygen Saturation Statistics:   Oxygen Saturation (%) Mean: 96%                     O2 Saturation Range (%):                                    Between  90 through 99% O2 Saturation (minutes) <89%:      0 minutes         Pulse Rate Statistics:   Pulse Mean (bpm):    69  bpm             Pulse Range:  between 54 and 96 bpm               IMPRESSION:  This HST confirms that sleep apnea is no longer present !   Neither was there any snoring, in spite of a high REM sleep  proportion and supine sleep.  Sleep architecture was  unfragmented.    RECOMMENDATION: There is no need for CPAP intervention.  The complaint of hypersomnia is unrelated to sleep apnea.     INTERPRETING PHYSICIAN:   Melvyn Novas, MD   [facility] and [facility] Sleep Board certified by [employer] and Diplomate of the [employer]. Board certified In Neurology through the ABPN, Fellow of the [employer].

## 2023-11-13 ENCOUNTER — Encounter: Payer: Self-pay | Admitting: Neurology

## 2023-11-13 DIAGNOSIS — G4713 Recurrent hypersomnia: Secondary | ICD-10-CM

## 2023-11-13 NOTE — Procedures (Signed)
[name] Female, 52 years old, date of birth 1971-08-15   HOME SLEEP TEST REPORT ( by Watch PAT)   STUDY DATE:  11-10-2023 ( mail out test data loaded)    ORDERING CLINICIAN: Melvyn Novas, MD  REFERRING CLINICIAN: PCP    INFORMATION/HISTORY: 10-24-2023 visit , this patient was originally referred by Dr. Anne Hahn in 2020 but had been tested for sleep apnea as early as 2015. Her AHI was 6.9/h , mild OSA and she has for now continued to use CPAP, using an I Breeze machine since 2021 - download is posted in RV note.   She has a history of cervical fusion. She presents with concerns of  persistent hypersomnia, and reports that her mother noted her to snore on a recent cruise vacation. Since last sleep test, she has lost weight, about 50 lbs , needs retesting.    Epworth sleepiness score: 12/24. FSS : na. 63 points.    BMI: 40.8 kg/m   Neck Circumference: 15.25 "   FINDINGS:   Sleep Summary:   Total Recording Time (hours, min):    7 hours and 20 minutes     Total Sleep Time (hours, min):     6 h and 49 m             Percent REM (%):    27.6%                                    Respiratory Indices:   Calculated pAHI (per hour):     0.7/h                         REM pAHI : 1.6 /h   NREM pAHI:   0.4/h                            Supine AHI:  100% of sleep time   Very little snoring - see graph on attached table.                                                  Oxygen Saturation Statistics:   Oxygen Saturation (%) Mean: 96%                     O2 Saturation Range (%):                                    Between  90 through 99% O2 Saturation (minutes) <89%:      0 minutes         Pulse Rate Statistics:   Pulse Mean (bpm):    69  bpm             Pulse Range:  between 54 and 96 bpm               IMPRESSION:  This HST confirms that sleep apnea is no longer present !   Neither was there any snoring, in spite of a high REM sleep proportion  and supine sleep.  Sleep architecture was unfragmented.    RECOMMENDATION: There is  no need for CPAP intervention.  The complaint of hypersomnia is unrelated to sleep apnea.     INTERPRETING PHYSICIAN:   Melvyn Novas, MD   [facility] and [facility] Sleep Board certified by [employer] and Diplomate of the [employer]. Board certified In Neurology through the ABPN, Fellow of the [employer].

## 2023-11-29 ENCOUNTER — Encounter (INDEPENDENT_AMBULATORY_CARE_PROVIDER_SITE_OTHER): Payer: Self-pay | Admitting: Family Medicine

## 2023-11-29 ENCOUNTER — Ambulatory Visit (INDEPENDENT_AMBULATORY_CARE_PROVIDER_SITE_OTHER): Payer: Managed Care, Other (non HMO) | Admitting: Family Medicine

## 2023-11-29 VITALS — BP 155/81 | HR 64 | Temp 97.9°F | Ht 65.0 in | Wt 221.0 lb

## 2023-11-29 DIAGNOSIS — E222 Syndrome of inappropriate secretion of antidiuretic hormone: Secondary | ICD-10-CM

## 2023-11-29 DIAGNOSIS — I10 Essential (primary) hypertension: Secondary | ICD-10-CM

## 2023-11-29 DIAGNOSIS — E1169 Type 2 diabetes mellitus with other specified complication: Secondary | ICD-10-CM

## 2023-11-29 DIAGNOSIS — E119 Type 2 diabetes mellitus without complications: Secondary | ICD-10-CM | POA: Diagnosis not present

## 2023-11-29 DIAGNOSIS — Z7985 Long-term (current) use of injectable non-insulin antidiabetic drugs: Secondary | ICD-10-CM

## 2023-11-29 DIAGNOSIS — Z6836 Body mass index (BMI) 36.0-36.9, adult: Secondary | ICD-10-CM

## 2023-11-29 DIAGNOSIS — E669 Obesity, unspecified: Secondary | ICD-10-CM | POA: Diagnosis not present

## 2023-11-29 MED ORDER — TIRZEPATIDE 15 MG/0.5ML ~~LOC~~ SOAJ: 15.0000 mg | SUBCUTANEOUS | 0 refills | Status: DC

## 2023-11-29 NOTE — Progress Notes (Signed)
 .smr  Office: [phone]  /  Fax: [phone]  WEIGHT SUMMARY AND BIOMETRICS  Anthropometric Measurements Height: 5' 5 (1.651 m) Weight: 221 lb (100.2 kg) BMI (Calculated): 36.78 Weight at Last Visit: 228 lb Weight Lost Since Last Visit: 7 lb Weight Gained Since Last Visit: 0 Starting Weight: 274 lb Total Weight Loss (lbs): 53 lb (24 kg) Peak Weight: 289 lb   Body Composition  Body Fat %: 48.4 % Fat Mass (lbs): 107 lbs Muscle Mass (lbs): 108.2 lbs Total Body Water (lbs): 82.4 lbs Visceral Fat Rating : 13   Other Clinical Data Fasting: No Labs: No Today's Visit #: 40 Starting Date: 08/07/21    Chief Complaint: OBESITY    History of Present Illness   The patient, with a history of type two diabetes, hypertension, and obesity, has been actively working on weight loss and has lost seven pounds in the last month. She reports adherence to a category two eating plan approximately 85% of the time and has been engaging in regular exercise at the gym for about 60 minutes three times per week. She is currently on Mounjaro  for her type two diabetes and has been focusing on diet, exercise, and weight loss to help control her blood sugars.  Despite these positive lifestyle changes, the patient has had some intermittently elevated blood pressures over the last few months. She has a history of hyponatremia with olmesartan  and dry eyes and dry cough with lisinopril  hydrochlorothiazide .  The patient also reports a recent increase in social activities, including going for walks and watching football, which have been beneficial for her weight loss efforts. She has been eating out more frequently but has been mindful to order protein-rich meals and limit her intake of unhealthy foods.  The patient has a history of alcohol addiction and continues to attend AA meetings. She also reports a positive new romantic relationship, which has been a source of happiness and support.  The patient  has been experiencing issues with sodium levels, which she believes are related to her Trileptal  medication. She has been advised to limit her liquid intake to 28 ounces per day, which she finds challenging. She is due for a follow-up with her nephrologist in three months to monitor her sodium levels.  Despite these challenges, the patient is generally happy with her progress and is motivated to continue her weight loss and health improvement efforts. She has noticed significant improvements, including no longer needing to use a CPAP machine and being able to wear smaller clothing sizes.          PHYSICAL EXAM:  Blood pressure (!) 155/81, pulse 64, temperature 97.9 F (36.6 C), height 5' 5 (1.651 m), weight 221 lb (100.2 kg), last menstrual period 04/25/2013, SpO2 98%. Body mass index is 36.78 kg/m.  DIAGNOSTIC DATA REVIEWED:  BMET    Component Value Date/Time   NA 129 (L) 11/02/2023 1319   K 4.4 11/02/2023 1319   CL 92 (L) 11/02/2023 1319   CO2 23 11/02/2023 1319   GLUCOSE 89 11/02/2023 1319   GLUCOSE 86 09/14/2023 1235   BUN 10 11/02/2023 1319   CREATININE 0.63 11/02/2023 1319   CREATININE 0.61 09/19/2020 0847   CALCIUM  9.4 11/02/2023 1319   GFRNONAA >60 07/20/2023 0553   GFRNONAA 106 09/19/2020 0847   GFRAA 123 09/19/2020 0847   Lab Results  Component Value Date   HGBA1C 5.2 07/13/2023   HGBA1C 6.1 (H) 10/27/2012   Lab Results  Component Value Date   INSULIN  41.2 (  H) 07/14/2022   INSULIN  29.5 (H) 08/07/2021   Lab Results  Component Value Date   TSH 1.94 07/13/2023   CBC    Component Value Date/Time   WBC 9.8 07/20/2023 0553   RBC 4.30 07/20/2023 0553   HGB 12.7 07/20/2023 0553   HGB 13.2 08/07/2021 0000   HCT 38.5 07/20/2023 0553   HCT 40.2 08/07/2021 0000   PLT 266 07/20/2023 0553   PLT 284 08/07/2021 0000   MCV 89.5 07/20/2023 0553   MCV 89 08/07/2021 0000   MCH 29.5 07/20/2023 0553   MCHC 33.0 07/20/2023 0553   RDW 13.4 07/20/2023 0553   RDW 13.4  08/07/2021 0000   Iron  Studies    Component Value Date/Time   IRON  54 10/06/2017 1505   TIBC 304 10/06/2017 1505   FERRITIN 151 (H) 10/06/2017 1505   IRONPCTSAT 18 10/06/2017 1505   Lipid Panel     Component Value Date/Time   CHOL 259 (H) 07/13/2023 0941   CHOL 216 (H) 07/14/2022 0934   TRIG 195.0 (H) 07/13/2023 0941   HDL 44.20 07/13/2023 0941   HDL 44 07/14/2022 0934   CHOLHDL 6 07/13/2023 0941   VLDL 39.0 07/13/2023 0941   LDLCALC 176 (H) 07/13/2023 0941   LDLCALC 139 (H) 07/14/2022 0934   LDLCALC 171 (H) 09/19/2020 0847   LDLDIRECT 145.0 07/18/2019 1410   Hepatic Function Panel     Component Value Date/Time   PROT 7.3 07/13/2023 0941   PROT 6.8 07/14/2022 0934   ALBUMIN 4.1 07/13/2023 0941   ALBUMIN 4.2 07/14/2022 0934   AST 19 07/13/2023 0941   ALT 21 07/13/2023 0941   ALKPHOS 55 07/13/2023 0941   BILITOT 0.3 07/13/2023 0941   BILITOT <0.2 07/14/2022 0934      Component Value Date/Time   TSH 1.94 07/13/2023 0941   Nutritional Lab Results  Component Value Date   VD25OH 76.34 07/13/2023   VD25OH 64.0 07/14/2022   VD25OH 60.0 12/28/2021     Assessment and Plan    Type 2 Diabetes Mellitus Type 2 diabetes mellitus, managed with Mounjaro , diet, exercise, and weight loss. Blood sugars are controlled. Patient requests a refill of Mounjaro . - Refill Mounjaro   Hypertension Hypertension with recent readings of 155/81 and 151/77. Adverse reactions to olmesartan  (hyponatremia) and lisinopril  hydrochlorothiazide  (dry eyes, dry cough). Elevated blood pressure correlates with perceived low sodium levels. - Monitor blood pressure regularly - Discuss sodium management with nephrologist  SIADH (Syndrome of Inappropriate Antidiuretic Hormone Secretion) SIADH likely secondary to Trileptal , with sodium levels around 129. Managing fluid intake to 28 ounces/day. Nephrologist and endocrinologist are monitoring. Patient concerned about reducing sodium tablet intake. -  Schedule follow-up with nephrologist - Monitor sodium levels regularly - Consider reducing sodium tablet intake per nephrologist's advice  Obesity Obesity with recent weight loss of 7 pounds over the last month. Following a category two eating plan 85% of the time and exercising 60 minutes three times per week. Improved lifestyle habits and partner support. - Continue current diet and exercise regimen - Encourage ongoing weight loss efforts  General Health Maintenance Patient has reduced weight and no longer requires CPAP for sleep apnea. - Encourage continued healthy lifestyle choices - Schedule follow-up appointments as needed  Follow-up - Schedule follow-up with nephrologist - Monitor sodium levels in 2 months - Send MyChart message for any concerns or questions.        She was informed of the importance of frequent follow up visits to maximize her success with intensive  lifestyle modifications for her multiple health conditions.    [name], MD

## 2023-11-30 ENCOUNTER — Ambulatory Visit: Payer: 59 | Admitting: Obstetrics and Gynecology

## 2023-11-30 ENCOUNTER — Telehealth: Payer: Self-pay

## 2023-11-30 NOTE — Telephone Encounter (Signed)
 PA submitted through Cover My Meds for [facility]. Awaiting insurance determination. Key: [id number]

## 2023-12-02 ENCOUNTER — Other Ambulatory Visit: Payer: Self-pay | Admitting: Family Medicine

## 2023-12-02 DIAGNOSIS — I10 Essential (primary) hypertension: Secondary | ICD-10-CM

## 2023-12-05 ENCOUNTER — Telehealth: Payer: Self-pay

## 2023-12-05 ENCOUNTER — Encounter: Payer: Self-pay | Admitting: Family Medicine

## 2023-12-05 ENCOUNTER — Ambulatory Visit: Payer: Managed Care, Other (non HMO) | Admitting: Family Medicine

## 2023-12-05 VITALS — BP 146/90 | HR 75 | Temp 98.3°F | Wt 223.0 lb

## 2023-12-05 DIAGNOSIS — R609 Edema, unspecified: Secondary | ICD-10-CM | POA: Diagnosis not present

## 2023-12-05 DIAGNOSIS — I10 Essential (primary) hypertension: Secondary | ICD-10-CM | POA: Diagnosis not present

## 2023-12-05 DIAGNOSIS — E871 Hypo-osmolality and hyponatremia: Secondary | ICD-10-CM

## 2023-12-05 MED ORDER — AMLODIPINE BESYLATE 5 MG PO TABS: 5.0000 mg | ORAL_TABLET | Freq: Every day | ORAL | 2 refills | Status: DC

## 2023-12-05 NOTE — Telephone Encounter (Signed)
 Called and spoke with patient to follow-up with nurse call on 12/04/23, patient was in a Doctors office and couldn't talk but did state she has an appt this afternoon.

## 2023-12-05 NOTE — Progress Notes (Signed)
   Subjective:    Patient ID: Gloria [name], female    DOB: Jul 12, 1971, 53 y.o.   MRN: [id number]  HPI Here asking to check her blood sodium level and to discuss her HTN. She feels well today. She has SIADH with hyponatremia, and she sees Dr. Bernardino Gasman for nephrology care and she sees Dr. Burnard for cardiologic care. She follows a fluid restriction every day, but she does not take salt pills. Her sodium has been stable at 129 for the past few months. She takes Carvedilol  for HTN. At home her BP has been high the past few weeks, ranging from 157-171 systolic over 89-101 diastolic.  Her creatinine has been normal.   Review of Systems  Constitutional: Negative.   Respiratory: Negative.    Cardiovascular: Negative.        Objective:   Physical Exam Constitutional:      Appearance: Normal appearance. She is not ill-appearing.  Cardiovascular:     Rate and Rhythm: Normal rate and regular rhythm.     Pulses: Normal pulses.     Heart sounds: Normal heart sounds.  Pulmonary:     Effort: Pulmonary effort is normal.     Breath sounds: Normal breath sounds.  Musculoskeletal:     Right lower leg: No edema.     Left lower leg: No edema.  Neurological:     Mental Status: She is alert.           Assessment & Plan:  Her HTN is not well controlled so we will add Amlodipine  5 mg daily to the Carvedilol . Check a BMET for the hyponatremia. She will follow up with Dr. Mercer in 3-4 weeks.  [name], MD

## 2023-12-06 LAB — BASIC METABOLIC PANEL
BUN: 13 mg/dL (ref 6–23)
CO2: 26 meq/L (ref 19–32)
Calcium: 9.4 mg/dL (ref 8.4–10.5)
Chloride: 101 meq/L (ref 96–112)
Creatinine, Ser: 0.71 mg/dL (ref 0.40–1.20)
GFR: 96.77 mL/min (ref >60.00)
Glucose, Bld: 90 mg/dL (ref 70–99)
Potassium: 5.1 meq/L (ref 3.5–5.1)
Sodium: 135 meq/L (ref 135–145)

## 2023-12-07 ENCOUNTER — Telehealth (INDEPENDENT_AMBULATORY_CARE_PROVIDER_SITE_OTHER): Payer: Self-pay

## 2023-12-07 NOTE — Telephone Encounter (Signed)
 PA for Mounjaro  submitted and approved.  Outcome Approved on January 12 by [facility] 2017 [id number];XBJYNW:[id number];Review Type:Prior Auth;Coverage Start Date:11/30/2023;Coverage End Date:12/03/2024; Authorization Expiration Date: 12/02/2024

## 2023-12-09 ENCOUNTER — Encounter: Payer: Self-pay | Admitting: Obstetrics and Gynecology

## 2023-12-09 ENCOUNTER — Ambulatory Visit (INDEPENDENT_AMBULATORY_CARE_PROVIDER_SITE_OTHER): Payer: Managed Care, Other (non HMO) | Admitting: Obstetrics and Gynecology

## 2023-12-09 VITALS — BP 110/78 | HR 67 | Ht 64.5 in | Wt 222.0 lb

## 2023-12-09 DIAGNOSIS — E041 Nontoxic single thyroid nodule: Secondary | ICD-10-CM

## 2023-12-09 DIAGNOSIS — Z1231 Encounter for screening mammogram for malignant neoplasm of breast: Secondary | ICD-10-CM

## 2023-12-09 DIAGNOSIS — Z01419 Encounter for gynecological examination (general) (routine) without abnormal findings: Secondary | ICD-10-CM | POA: Diagnosis not present

## 2023-12-09 DIAGNOSIS — E2839 Other primary ovarian failure: Secondary | ICD-10-CM

## 2023-12-09 MED ORDER — ESTRADIOL 0.05 MG/24HR TD PTTW: 1.0000 | MEDICATED_PATCH | TRANSDERMAL | 12 refills | Status: AC

## 2023-12-09 MED ORDER — ESTRADIOL 0.05 MG/24HR TD PTTW: 1.0000 | MEDICATED_PATCH | TRANSDERMAL | 4 refills | Status: DC

## 2023-12-09 NOTE — Progress Notes (Signed)
53 y.o. y.o. female here for annual exam. Patient's last menstrual period was 04/25/2013.   G1P1L1  Divorced   RP:  Established patient presenting for annual gyn exam    HPI: Postmenopause, well on Estradiol patch 0.05 twice weekly x 7 years.  S/P Total Hysterectomy, Left Oophorectomy, partial Right Oophorectomy.  No pelvic pain.  Abstinent.  Pap Neg 09/2017.  Breasts wnl. Mammo Neg 10/2022.  Treated a UTI until 11/04/22.  Urinary urgency now.  BMI improved to 41.25, currently loosing weight at the [facility]. Increased fitness activities.  Has DM type 2.  Health Labs with Fam MD. Alen Bleacher done in 2021.  Flu vaccine at pharmacy. Several years since surgery, losing height.  To get baseline dxa Felt terrible when she stopped the patch and had to return to it. Recent ICU stay for SIADH with low sodium. Presented with stroke like symptoms.  Body mass index is 37.52 kg/m.     07/29/2023   10:24 AM 07/21/2023    1:50 PM 07/13/2023    9:41 AM  Depression screen PHQ 2/9  Decreased Interest 0 0 0  Down, Depressed, Hopeless 1 0 1  PHQ - 2 Score 1 0 1  Altered sleeping 1 1 1   Tired, decreased energy 2 1 1   Change in appetite 1 1 0  Feeling bad or failure about yourself  0 0 0  Trouble concentrating 0 0 0  Moving slowly or fidgety/restless 0 0 0  Suicidal thoughts 0 0 0  PHQ-9 Score 5 3 3   Difficult doing work/chores Somewhat difficult Not difficult at all Not difficult at all    Height 5' 4.5" (1.638 m), weight 222 lb (100.7 kg), last menstrual period 04/25/2013.     Component Value Date/Time   DIAGPAP  11/09/2022 1354    - Negative for intraepithelial lesion or malignancy (NILM)   ADEQPAP Satisfactory for evaluation. 11/09/2022 1354    GYN HISTORY:    Component Value Date/Time   DIAGPAP  11/09/2022 1354    - Negative for intraepithelial lesion or malignancy (NILM)   ADEQPAP Satisfactory for evaluation. 11/09/2022 1354    OB History  Gravida Para Term Preterm AB  Living  1 1 1   1   SAB IAB Ectopic Multiple Live Births      1    # Outcome Date GA Lbr Len/2nd Weight Sex Type Anes PTL Lv  1 Term     M Vag-Spont  N LIV    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   DDD (degenerative disc disease), lumbar    Depression    Diabetes (HCC)    Diverticulosis    Drug use    Elevated LFTs    Fibromyalgia    GERD (gastroesophageal reflux disease)    History of ETOH abuse    Hyperlipidemia    Hyperplastic colon polyp    Internal hemorrhoids    Migraines    NAFLD (nonalcoholic fatty liver disease)    Obese    Obstructive sleep apnea  02/25/2015   not diagnosed per pt 04-03-20   Osteoarthritis    lower back- DDD   Periodic limb movement disorder 02/25/2014   Pre-diabetes    pt taking metformin   RLS (restless legs syndrome)    Sleep apnea    Smoker    Snoring    Substance abuse (HCC)    Swallowing difficulty    Vitamin B12 deficiency    Vitamin D deficiency     Past Surgical History:  Procedure Laterality Date   CERVICAL FUSION     COLONOSCOPY     CYSTOSCOPY  06/06/2013   Procedure: CYSTOSCOPY;  Surgeon: Ok Edwards, MD;  Location: [facility];  Service: Gynecology;;   EXCISION METACARPAL MASS Right 09/03/2021   Procedure: EXCISION METACARPAL MASS RIGHT SMALL FINGER;  Surgeon: Cindee Salt, MD;  Location: [facility];  Service: Orthopedics;  Laterality: Right;   INTRAUTERINE DEVICE INSERTION  08/09/2008   PARAGUARD- removed   kidney surgery  1995   abcess   LAPAROSCOPY N/A 06/06/2013   Procedure: LAPAROSCOPY DIAGNOSTIC;  Surgeon: Ok Edwards, MD;  Location: [facility];  Service: Gynecology;  Laterality:  N/A;   LAPAROTOMY  06/06/2013   Procedure: EXPLORATORY LAPAROTOMY;  Surgeon: Ok Edwards, MD;  Location: [facility];  Service: Gynecology;;   LIVER BIOPSY     partial right kideny  Right    radial tunnel Bilateral 2005   VAGINAL HYSTERECTOMY Left 06/06/2013   Procedure: Vaginal Hysterectomy with Patiial Right Salpingectomy;  Surgeon: Ok Edwards, MD;  Location: [facility];  Service: Gynecology;  Laterality: Left;    Current Outpatient Medications on File Prior to Visit  Medication Sig Dispense Refill   albuterol (VENTOLIN HFA) 108 (90 Base) MCG/ACT inhaler Inhale 2 puffs into the lungs every 4 (four) hours as needed for wheezing or shortness of breath.     amLODipine (NORVASC) 5 MG tablet Take 1 tablet (5 mg total) by mouth daily. 30 tablet 2   aspirin EC 81 MG tablet Take 1 tablet (81 mg total) by mouth daily. Swallow whole.     azelastine (ASTELIN) 0.1 % nasal spray Place 2 sprays into both nostrils 2 (two) times daily. 30 mL 12   B Complex Vitamins (B COMPLEX PO) Take 1 tablet by mouth daily.     blood glucose meter kit and supplies KIT Dispense based on patient and insurance preference. Use up to four times daily as directed. (FOR ICD-9 250.00, 250.01). 1 each 0   Blood Glucose Monitoring Suppl (ONETOUCH VERIO FLEX SYSTEM) w/Device KIT Use to check Blood sugars twice daily before meals 1 kit 0   Brexpiprazole (REXULTI) 4 MG TABS Take 4 mg by mouth daily.     Calcium Carbonate-Vitamin D 600-400 MG-UNIT tablet Take 1 tablet by mouth daily.     carvedilol (COREG) 6.25 MG tablet TAKE 1 TABLET(6.25 MG) BY MOUTH TWICE DAILY WITH A MEAL 60 tablet 1   cetirizine (ZYRTEC) 10 MG tablet Take 10 mg by mouth daily.     Coenzyme Q10 (COQ10) 100 MG CAPS Take 100 mg by mouth daily.     D3-50 1.25 MG (50000 UT) capsule Take 50,000 Units by mouth once a week.     estradiol (VIVELLE-DOT) 0.05 MG/24HR patch Place 1 patch (0.05 mg total) onto the skin 2 (two) times a week. 24 patch 4   eszopiclone  (LUNESTA) 2 MG TABS tablet Take 2 mg by mouth at bedtime.     fluticasone (CUTIVATE) 0.05 % cream  Apply 1 Application topically 2 (two) times daily as needed (Behind ear/Behind belly flap).     fluticasone (FLONASE) 50 MCG/ACT nasal spray Place 2 sprays into both nostrils daily.     glucose blood (CONTOUR NEXT TEST) test strip Use to test blood glucose twice daily 100 each 5   hydrOXYzine (ATARAX) 10 MG tablet Take 20 mg by mouth daily as needed for anxiety.     ibuprofen (ADVIL) 800 MG tablet TAKE 1 TABLET(800 MG) BY MOUTH EVERY 8 HOURS AS NEEDED FOR MODERATE PAIN 30 tablet 3   Lancets (ONETOUCH DELICA PLUS LANCET30G) MISC USE 1  TO CHECK GLUCOSE 4 TIMES DAILY 100 each 0   LORazepam (ATIVAN) 0.5 MG tablet Take 0.5 mg by mouth daily as needed for anxiety.     Magnesium 500 MG CAPS Take 500 mg by mouth at bedtime.     Multiple Vitamins-Minerals (MULTIVITAMIN PO) Take 1 tablet by mouth daily.      OMEGA 3-6-9 FATTY ACIDS PO Take 500 mg by mouth daily.     oxcarbazepine (TRILEPTAL) 600 MG tablet Take 600 mg by mouth 2 (two) times daily.     rizatriptan (MAXALT) 10 MG tablet TAKE 1 TABLET BY MOUTH AS NEEDED FOR MIGRAINE 10 tablet 2   rosuvastatin (CRESTOR) 20 MG tablet Take 1 tablet (20 mg total) by mouth daily. 90 tablet 3   scopolamine (TRANSDERM-SCOP) 1 MG/3DAYS Place 1 patch onto the skin See admin instructions. Apply one patch every 72 hours as needed for travel.     sodium chloride 1 g tablet Take 1 tablet (1 g total) by mouth once as needed for up to 1 dose (symptomatic hyponatremia (fatigue, lethargy, confusion) or as directed by physician). 30 tablet 2   thiamine (VITAMIN B-1) 50 MG tablet Take 50 mg by mouth daily.     tirzepatide (MOUNJARO) 15 MG/0.5ML Pen Inject 15 mg into the skin once a week. 6 mL 0   vitamin C (ASCORBIC ACID) 500 MG tablet Take 500 mg by mouth daily.     VITAMIN E PO Take 900 mg by mouth daily.     No current facility-administered medications on file prior to visit.     Social History   Socioeconomic History   Marital status: Divorced    Spouse name: Not on file   Number of children: 1   Years of education: 13   Highest education level: Associate degree: academic program  Occupational History   Occupation: ACCOUNTING   Occupation: Armed forces operational officer  Tobacco Use   Smoking status: Former    Current packs/day: 0.00    Types: Cigarettes    Quit date: 01/26/2012    Years since quitting: 11.8   Smokeless tobacco: Never  Vaping Use   Vaping status: Never Used  Substance and Sexual Activity   Alcohol use: No    Alcohol/week: 0.0 standard drinks of alcohol    Comment: History of alcohol abuse   Drug use: No   Sexual activity: Not Currently    Partners: Male    Birth control/protection: Surgical, Abstinence    Comment: hysterectomy  Other Topics Concern   Not on file  Social History Narrative   Patient lives at home and works full time.   Education some college.   Right handed.   Caffeine: 2 cups daily.   Social Drivers of Health   Financial Resource Strain: Low Risk  (07/20/2023)   Overall Financial Resource Strain (CARDIA)    Difficulty of Paying Living Expenses: Not  very hard  Food Insecurity: No Food Insecurity (07/20/2023)   Hunger Vital Sign    Worried About Running Out of Food in the Last Year: Never true    Ran Out of Food in the Last Year: Never true  Transportation Needs: No Transportation Needs (07/20/2023)   PRAPARE - Administrator, Civil Service (Medical): No    Lack of Transportation (Non-Medical): No  Physical Activity: Insufficiently Active (07/20/2023)   Exercise Vital Sign    Days of Exercise per Week: 2 days    Minutes of Exercise per Session: 20 min  Stress: No Stress Concern Present (07/20/2023)   [employer] of Occupational Health - Occupational Stress Questionnaire    Feeling of Stress : Only a little  Social Connections: Moderately Integrated (07/20/2023)   Social Connection and Isolation Panel  [NHANES]    Frequency of Communication with Friends and Family: More than three times a week    Frequency of Social Gatherings with Friends and Family: More than three times a week    Attends Religious Services: More than 4 times per year    Active Member of [employer] or Organizations: Yes    Attends Engineer, structural: More than 4 times per year    Marital Status: Divorced  Catering manager Violence: Not At Risk (07/19/2023)   Humiliation, Afraid, Rape, and Kick questionnaire    Fear of Current or Ex-Partner: No    Emotionally Abused: No    Physically Abused: No    Sexually Abused: No    Family History  Problem Relation Age of Onset   Cancer Mother    Breast cancer Mother    Depression Mother    Anxiety disorder Mother    Obesity Mother    Sleep apnea Mother    Obesity Father    Anxiety disorder Father    Cancer Father    Hyperlipidemia Father    Colon polyps Father    Diabetes Father    Cirrhosis Father    Liver cancer Father    Liver disease Father    Alcoholism Father    Depression Father    Alcohol abuse Father    Sleep apnea Father    Irritable bowel syndrome Sister    Sleep apnea Brother    Breast cancer Maternal Aunt    Alcoholism Maternal Aunt    Alcoholism Maternal Grandmother    Alcoholism Maternal Grandfather    Breast cancer Paternal Grandmother    Colon cancer Paternal Grandmother    Esophageal cancer Paternal Grandfather    Stomach cancer Paternal Grandfather    Alcoholism Paternal Grandfather    Rectal cancer Neg Hx      Allergies  Allergen Reactions   Benadryl [Diphenhydramine Hcl] Hives   Ropinirole Other (See Comments)   Levofloxacin Other (See Comments)   Aspirin    Codeine    Doxycycline    Lisinopril-Hydrochlorothiazide Other (See Comments)    Dry cough, dry eyes, insomnia, emotionally liable, increased thirst, brain fog, fatigue.   Metformin And Related     Blurred vision   Nuvigil [Armodafinil]     migraines    Olmesartan  Other (See Comments)    May have contributed to hyponatremia.  Baseline sodium 131-133.   Requip [Ropinirole Hcl]     Increased headache   Semaglutide Other (See Comments)    Rybelsus caused depression and fibromyalgia flair.   Tramadol     Other reaction(s): Other (See Comments)   Wellbutrin [Bupropion]  Zolpidem       Patient's last menstrual period was Patient's last menstrual period was 04/25/2013.[name]            Review of Systems Alls systems reviewed and are negative.     Physical Exam Constitutional:      Appearance: Normal appearance.  Genitourinary:     Vulva normal.     No lesions in the vagina.     Right Labia: No rash, lesions or skin changes.    Left Labia: No lesions, skin changes or rash.    Vaginal cuff intact.    No vaginal discharge or tenderness.     No vaginal prolapse present.    No vaginal atrophy present.     Right Adnexa: absent.    Left Adnexa: not tender and no mass present.    Cervix is absent.     Uterus is absent.  Breasts:    Right: Normal.     Left: Normal.  HENT:     Head: Normocephalic.  Neck:     Thyroid: No thyroid mass, thyromegaly or thyroid tenderness.  Cardiovascular:     Rate and Rhythm: Normal rate and regular rhythm.     Heart sounds: Normal heart sounds, S1 normal and S2 normal.  Pulmonary:     Effort: Pulmonary effort is normal.     Breath sounds: Normal breath sounds and air entry.  Abdominal:     General: There is no distension.     Palpations: Abdomen is soft. There is no mass.     Tenderness: There is no abdominal tenderness. There is no guarding or rebound.  Musculoskeletal:        General: Normal range of motion.     Cervical back: Full passive range of motion without pain, normal range of motion and neck supple. No tenderness.     Right lower leg: No edema.     Left lower leg: No edema.  Neurological:     Mental Status: She is alert.  Skin:    General: Skin is warm.  Psychiatric:        Mood and Affect:  Mood normal.        Behavior: Behavior normal.        Thought Content: Thought content normal.  Vitals and nursing note reviewed. Exam conducted with a chaperone present.       A:         Well Woman GYN exam                             P:        Pap smear not indicated Encouraged annual mammogram screening Colon cancer screening not indicated DXA ordered today Labs and immunizations to do with PMD Discussed breast self exams Encouraged healthy lifestyle practices Encouraged Vit D and Calcium   No follow-ups on file.  Earley Favor

## 2023-12-10 LAB — FOLLICLE STIMULATING HORMONE: FSH: 15 m[IU]/mL

## 2023-12-10 LAB — VITAMIN D 25 HYDROXY (VIT D DEFICIENCY, FRACTURES): Vit D, 25-Hydroxy: 78 ng/mL (ref 30–100)

## 2023-12-10 LAB — ESTRADIOL: Estradiol: 39 pg/mL

## 2023-12-12 ENCOUNTER — Encounter: Payer: Self-pay | Admitting: Obstetrics and Gynecology

## 2023-12-12 ENCOUNTER — Ambulatory Visit: Payer: Managed Care, Other (non HMO) | Admitting: Family Medicine

## 2023-12-16 ENCOUNTER — Encounter: Payer: Self-pay | Admitting: Obstetrics and Gynecology

## 2023-12-16 MED ORDER — FLUCONAZOLE 150 MG PO TABS: 150.0000 mg | ORAL_TABLET | Freq: Once | ORAL | 0 refills | Status: AC

## 2023-12-20 ENCOUNTER — Ambulatory Visit (INDEPENDENT_AMBULATORY_CARE_PROVIDER_SITE_OTHER): Payer: Managed Care, Other (non HMO) | Admitting: Family Medicine

## 2023-12-20 ENCOUNTER — Encounter (INDEPENDENT_AMBULATORY_CARE_PROVIDER_SITE_OTHER): Payer: Self-pay | Admitting: Family Medicine

## 2023-12-20 VITALS — BP 144/77 | HR 69 | Temp 98.3°F | Ht 64.5 in | Wt 216.0 lb

## 2023-12-20 DIAGNOSIS — R079 Chest pain, unspecified: Secondary | ICD-10-CM

## 2023-12-20 DIAGNOSIS — E1169 Type 2 diabetes mellitus with other specified complication: Secondary | ICD-10-CM

## 2023-12-20 DIAGNOSIS — E785 Hyperlipidemia, unspecified: Secondary | ICD-10-CM

## 2023-12-20 DIAGNOSIS — E782 Mixed hyperlipidemia: Secondary | ICD-10-CM

## 2023-12-20 DIAGNOSIS — E119 Type 2 diabetes mellitus without complications: Secondary | ICD-10-CM | POA: Diagnosis not present

## 2023-12-20 DIAGNOSIS — Z6836 Body mass index (BMI) 36.0-36.9, adult: Secondary | ICD-10-CM

## 2023-12-20 DIAGNOSIS — I10 Essential (primary) hypertension: Secondary | ICD-10-CM

## 2023-12-20 DIAGNOSIS — Z7985 Long-term (current) use of injectable non-insulin antidiabetic drugs: Secondary | ICD-10-CM

## 2023-12-20 DIAGNOSIS — E669 Obesity, unspecified: Secondary | ICD-10-CM

## 2023-12-20 MED ORDER — TIRZEPATIDE 15 MG/0.5ML ~~LOC~~ SOAJ: 15.0000 mg | SUBCUTANEOUS | 0 refills | Status: DC

## 2023-12-20 NOTE — Progress Notes (Signed)
.smr  Office: [phone]  /  Fax: [phone]  WEIGHT SUMMARY AND BIOMETRICS  Anthropometric Measurements Height: 5' 4.5" (1.638 m) Weight: 216 lb (98 kg) BMI (Calculated): 36.52 Weight at Last Visit: 221 lb Weight Lost Since Last Visit: 5 lb Weight Gained Since Last Visit: 0 Starting Weight: 274 lb Total Weight Loss (lbs): 58 lb (26.3 kg) Peak Weight: 289 lb   Body Composition  Body Fat %: 48.2 % Fat Mass (lbs): 104.4 lbs Muscle Mass (lbs): 106.4 lbs Total Body Water (lbs): 81.6 lbs Visceral Fat Rating : 13   Other Clinical Data Fasting: no Labs: no Today's Visit #: 41 Starting Date: 08/07/21    Chief Complaint: OBESITY   History of Present Illness   She has been experiencing chest pain that has persisted throughout the day. The pain is located on the  L side and does not worsen with exertion, but can be felt more when chest wall is palpated. There is no relief from the pain, and it has been constant despite her attempts to ignore it due to being busy at work. She feels 'off'" since waking up, with symptoms including tiredness, sluggishness, and a sensation of being bleary-eyed' despite sleeping for ten hours. No heartburn, sweating, or classic signs of cardiac issues are present. She also reports a sensation in her left arm that feels 'different' but not numb.  She has a history of a recent coronary calcium score of 61.4 and is now on Crestor. Previous echocardiogram and EKG were normal. She denies any recent episodes of slurred speech, facial droop, or unilateral weakness.  She is currently on two blood pressure medications, amlodipine and carvedilol, and reports that her blood pressure has been lower since starting these medications, typically around 101, but was elevated last night and today. She attributes some of the stress to a recent breakup and a fall down the stairs, which resulted in bruising on her shin. She denies missing any doses of her medications.  She  has a history of obesity and has lost five pounds in the last three weeks. She is following a category two eating plan 80% of the time but is not currently exercising. She is on [facility] for diabetes and requests a refill. She mentions not eating much last week due to stress from a recent breakup.          PHYSICAL EXAM:  Blood pressure (!) 144/77, pulse 69, temperature 98.3 F (36.8 C), height 5' 4.5" (1.638 m), weight 216 lb (98 kg), last menstrual period 04/25/2013, SpO2 99%. Body mass index is 36.5 kg/m.  DIAGNOSTIC DATA REVIEWED:  BMET    Component Value Date/Time   NA 135 12/05/2023 1447   NA 129 (L) 11/02/2023 1319   K 5.1 12/05/2023 1447   CL 101 12/05/2023 1447   CO2 26 12/05/2023 1447   GLUCOSE 90 12/05/2023 1447   BUN 13 12/05/2023 1447   BUN 10 11/02/2023 1319   CREATININE 0.71 12/05/2023 1447   CREATININE 0.61 09/19/2020 0847   CALCIUM 9.4 12/05/2023 1447   GFRNONAA >60 07/20/2023 0553   GFRNONAA 106 09/19/2020 0847   GFRAA 123 09/19/2020 0847   Lab Results  Component Value Date   HGBA1C 5.2 07/13/2023   HGBA1C 6.1 (H) 10/27/2012   Lab Results  Component Value Date   INSULIN 41.2 (H) 07/14/2022   INSULIN 29.5 (H) 08/07/2021   Lab Results  Component Value Date   TSH 1.94 07/13/2023   CBC    Component Value Date/Time  WBC 9.8 07/20/2023 0553   RBC 4.30 07/20/2023 0553   HGB 12.7 07/20/2023 0553   HGB 13.2 08/07/2021 0000   HCT 38.5 07/20/2023 0553   HCT 40.2 08/07/2021 0000   PLT 266 07/20/2023 0553   PLT 284 08/07/2021 0000   MCV 89.5 07/20/2023 0553   MCV 89 08/07/2021 0000   MCH 29.5 07/20/2023 0553   MCHC 33.0 07/20/2023 0553   RDW 13.4 07/20/2023 0553   RDW 13.4 08/07/2021 0000   Iron Studies    Component Value Date/Time   IRON 54 10/06/2017 1505   TIBC 304 10/06/2017 1505   FERRITIN 151 (H) 10/06/2017 1505   IRONPCTSAT 18 10/06/2017 1505   Lipid Panel     Component Value Date/Time   CHOL 259 (H) 07/13/2023 0941   CHOL 216  (H) 07/14/2022 0934   TRIG 195.0 (H) 07/13/2023 0941   HDL 44.20 07/13/2023 0941   HDL 44 07/14/2022 0934   CHOLHDL 6 07/13/2023 0941   VLDL 39.0 07/13/2023 0941   LDLCALC 176 (H) 07/13/2023 0941   LDLCALC 139 (H) 07/14/2022 0934   LDLCALC 171 (H) 09/19/2020 0847   LDLDIRECT 145.0 07/18/2019 1410   Hepatic Function Panel     Component Value Date/Time   PROT 7.3 07/13/2023 0941   PROT 6.8 07/14/2022 0934   ALBUMIN 4.1 07/13/2023 0941   ALBUMIN 4.2 07/14/2022 0934   AST 19 07/13/2023 0941   ALT 21 07/13/2023 0941   ALKPHOS 55 07/13/2023 0941   BILITOT 0.3 07/13/2023 0941   BILITOT <0.2 07/14/2022 0934      Component Value Date/Time   TSH 1.94 07/13/2023 0941   Nutritional Lab Results  Component Value Date   VD25OH 78 12/09/2023   VD25OH 76.34 07/13/2023   VD25OH 64.0 07/14/2022     Assessment and Plan    Chest Pain   Intermittent chest pain described as L chest wall pain, lasting all day. No exacerbation with palpation or movement. No associated symptoms of sweating, heartburn, or classic signs of myocardial infarction. Recent stress and physical trauma (fall down stairs) noted. EKG performed, no immediate signs of myocardial infarction. No ST elevation, no T wave inversion. Differential includes musculoskeletal pain, stress-related pain, and potential early cardiac event. Emphasized monitoring for worsening symptoms such as increased pressure, pain radiating to jaw, or difficulty breathing. Discussed signs of myocardial infarction specific to women and the importance of intuition in recognizing serious symptoms.   - Perform EKG   - Advise monitoring for worsening symptoms and go to ER if she occurs. She agreed to this recommendation and knows to call 911 instead of driving herself to the hospital. She declined going to the ED now as advised. - Discuss signs of myocardial infarction specific to women   - Advise rest and stress management   - Check blood pressure at home    -Call her cardiologist in the AM to inform them and see if they want to see her -Recent sodium level was checked and WNL  Hypertension   Blood pressure elevated today (142/82, 144/77) despite being on amlodipine and carvedilol. Recent stress and pain likely contributing to elevated readings. Good adherence to medication regimen reported. Explained that pain and stress can elevate blood pressure and advised monitoring at home after rest.   - Advise monitoring blood pressure at home   - Reassess blood pressure after rest and stress reduction    Hyperlipidemia   On Crestor with a recent coronary calcium score of 61.4 indicating some  plaque buildup. Echocardiogram and previous evaluations show good heart structure and function. Explained the difference between hard and soft plaque and the importance of managing blood pressure to prevent plaque rupture. Discussed that Crestor can reduce plaque buildup and prevent worsening.   - Continue Crestor   - Educate on the difference between hard and soft plaque and the importance of managing blood pressure to prevent plaque rupture  -Continue diet and weight loss to help decrease CAD   Diabetes Mellitus   On Mounjaro with no reported gastrointestinal issues. Recent weight loss of five pounds noted. Not currently exercising and experiencing stress-related eating patterns. Discussed the importance of maintaining healthy eating and exercise habits to manage diabetes effectively.   - Refill Mounjaro   - Encourage resumption of healthy eating and exercise when possible   - Check blood sugar levels    Obesity   Following category two eating plan 80% of the time. Recent weight loss of five pounds noted. No current exercise regimen due to recent stress and physical pain. Encouraged resumption of exercise when pain subsides and continuation of the eating plan.   - Encourage resumption of exercise when pain subsides   - Continue following eating plan       Follow-up   -  Follow up with cardiologist Dr. Tresa Endo as advised - Advise going to ER if experiencing worsening or ongoing symptoms or intuition suggests something is wrong.      I have personally spent 60 minutes total time today in preparation, patient care, and documentation for this visit, including the following: review of clinical lab tests; review of medical tests/procedures/services.    She was informed of the importance of frequent follow up visits to maximize her success with intensive lifestyle modifications for her multiple health conditions.    Quillian Quince, MD

## 2023-12-26 ENCOUNTER — Ambulatory Visit (INDEPENDENT_AMBULATORY_CARE_PROVIDER_SITE_OTHER): Payer: Self-pay | Admitting: Neurology

## 2023-12-26 ENCOUNTER — Encounter: Payer: Self-pay | Admitting: Neurology

## 2023-12-26 VITALS — BP 147/78 | HR 75 | Ht 64.5 in | Wt 219.2 lb

## 2023-12-26 DIAGNOSIS — R634 Abnormal weight loss: Secondary | ICD-10-CM

## 2023-12-26 DIAGNOSIS — T50905A Adverse effect of unspecified drugs, medicaments and biological substances, initial encounter: Secondary | ICD-10-CM

## 2023-12-26 NOTE — Patient Instructions (Signed)
NO need to use CPAP any longer.   Surgery Safety When You Have Sleep Apnea Sleep apnea is a condition that affects your breathing while you're sleeping. You may have shallow breathing or stop breathing for short periods of time. Tell your health care team if you have sleep apnea and you're going to have surgery. Some people with sleep apnea don't know that they have it. Sleep apnea can increase your risk of complications during and after surgery. Tell a health care provider about: Any allergies you have. All medicines you take. These include vitamins, herbs, eye drops, and creams. Any bleeding problems you have. Any problems you or family members have had with anesthesia. Any medical conditions you have, especially if you have sleep apnea. Any surgeries you've had. Whether you're pregnant or may be pregnant. What are the risks of sleep apnea during surgery? If sleep apnea isn't treated it can cause problems during and after surgery. Medicine used to relax you during surgery may cause your airway to block the flow of air. Sleep apnea that isn't treated may increase your risk for: A longer stay in the recovery room or hospital. Breathing trouble, such as low oxygen levels after surgery. More pain after surgery. Irregular heart rhythms. Stroke. Heart attack. Tell your team that you have sleep apnea. They can take steps to help prevent complications. What happens before the surgery? Sleep apnea screening Sleep apnea screening is like a short test that helps find out if you have sleep apnea. You may be screened many times before surgery. If you don't know if you have sleep apnea, ask your provider to screen you or you may take the short online test yourself. Some questions you may be asked include: Do you snore? Has a partner or spouse told you that you stop breathing, choke, or gasp during sleep? Is your sleep restless? Do you have daytime sleepiness? Have you had trouble concentrating or  memory loss? Answer these questions honestly. If a screening test is positive, this means you are at risk for the condition. More tests may be needed to confirm a diagnosis of sleep apnea. What happens on the day of surgery? If you have a sleep apnea device, bring it with you to wear after surgery. Wear your sleep apnea device when you are sleeping during your hospital stay, or as told by your provider. What can I expect after the surgery? You may need to be given extra oxygen and wear a continuous oxygen monitor. For your safety, you may need to stay in the recovery room or hospital for longer than usual. If you'll be going home right after the surgery, plan to have a responsible adult: Drive you home from the hospital or clinic. You won't be allowed to drive. Stay with you for the time you're told. Follow these instructions at home: Medicines Take your medicines only as told. Your sleep apnea may get worse if you take prescription pain medicine, sleeping medicine, or medicine that makes you drowsy. Ask about taking pain medicines that do not affect your breathing, such as ibuprofen or acetaminophen. General instructions  If your health care provider says it is OK, raise the head of your bed or lie on your side. Do not lie flat on your back. Follow instructions from your health care provider about wearing your sleep device. Use it anytime you are sleeping, including during daytime naps. Where to find more information To learn more, go to these websites: Centers for Disease Control and Prevention: DiningCalendar.de.  Then: Click Health Topics A-Z. Type "sleep apnea" in the search box. National Heart, Lung, and Blood Institute: BuffaloDryCleaner.gl American Sleep Apnea Association: sleephealth.org Contact a health care provider if: You are scheduled for surgery and have sleep apnea or think you have sleep apnea. Get help right away if: You have trouble breathing. You are very drowsy and can't stay  awake. You are told that you have pauses in your breathing during sleep after surgery. You have chest pain. You have a fast heartbeat. These symptoms may be an emergency. Call 911 right away. Do not wait to see if the symptoms will go away. Do not drive yourself to the hospital. This information is not intended to replace advice given to you by your health care provider. Make sure you discuss any questions you have with your health care provider. Document Revised: 04/16/2023 Document Reviewed: 04/16/2023 Elsevier Patient Education  2024 [employer].

## 2023-12-26 NOTE — Progress Notes (Signed)
Provider:  Melvyn Novas, MD  Primary Care Physician:  Deeann Saint, MD [address] [place] [address]     Referring Provider: Deeann Saint, Md [address] [place],  [place] [address]          Chief Complaint according to patient   Patient presents with:     New Patient (Initial Visit)           HISTORY OF PRESENT ILLNESS:  Gloria [name] is a 53 y.o. female patient who is here for revisit 12/26/2023 for  follow up on her HST, which showed apnea is not longer present. I consider CPAP no longer necessary.   Weight loss has greatly helped.  No sleepiness, no morning headaches, no bruxism, no RLS.  BP 147. 78 mmHg. Left         Review of Systems: Out of a complete 14 system review, the patient complains of only the following symptoms, and all other reviewed systems are negative.:     How likely are you to doze in the following situations: 0 = not likely, 1 = slight chance, 2 = moderate chance, 3 = high chance   Sitting and Reading? Watching Television? Sitting inactive in a public place (theater or meeting)? As a passenger in a car for an hour without a break? Lying down in the afternoon when circumstances permit? Sitting and talking to someone? Sitting quietly after lunch without alcohol? In a car, while stopped for a few minutes in traffic?   Total = 2/ 24 points   FSS endorsed at 21/ 63 points.   Social History   Socioeconomic History   Marital status: Divorced    Spouse name: Not on file   Number of children: 1   Years of education: 13   Highest education level: Associate degree: academic program  Occupational History   Occupation: ACCOUNTING   Occupation: Armed forces operational officer  Tobacco Use   Smoking status: Former    Current packs/day: 0.00    Types: Cigarettes    Quit date: 01/26/2012    Years since quitting: 11.9   Smokeless tobacco: Never  Vaping Use   Vaping status: Never Used  Substance and Sexual  Activity   Alcohol use: No    Alcohol/week: 0.0 standard drinks of alcohol    Comment: History of alcohol abuse   Drug use: No   Sexual activity: Yes    Partners: Male    Birth control/protection: Surgical    Comment: hysterectomy  Other Topics Concern   Not on file  Social History Narrative   Patient lives at home and works full time.   Education some college.   Right handed.   Caffeine: 2 cups daily.   Social Drivers of Corporate investment banker Strain: Low Risk  (07/20/2023)   Overall Financial Resource Strain (CARDIA)    Difficulty of Paying Living Expenses: Not very hard  Food Insecurity: No Food Insecurity (07/20/2023)   Hunger Vital Sign    Worried About Running Out of Food in the Last Year: Never true    Ran Out of Food in the Last Year: Never true  Transportation Needs: No Transportation Needs (07/20/2023)   PRAPARE - Administrator, Civil Service (Medical): No    Lack of Transportation (Non-Medical): No  Physical Activity: Insufficiently Active (07/20/2023)   Exercise Vital Sign    Days of Exercise per Week: 2 days  Minutes of Exercise per Session: 20 min  Stress: No Stress Concern Present (07/20/2023)   [employer] of Occupational Health - Occupational Stress Questionnaire    Feeling of Stress : Only a little  Social Connections: Moderately Integrated (07/20/2023)   Social Connection and Isolation Panel [NHANES]    Frequency of Communication with Friends and Family: More than three times a week    Frequency of Social Gatherings with Friends and Family: More than three times a week    Attends Religious Services: More than 4 times per year    Active Member of [employer] or Organizations: Yes    Attends Engineer, structural: More than 4 times per year    Marital Status: Divorced    Family History  Problem Relation Age of Onset   Cancer Mother    Breast cancer Mother    Depression Mother    Anxiety disorder Mother    Obesity Mother     Sleep apnea Mother    Obesity Father    Anxiety disorder Father    Cancer Father    Hyperlipidemia Father    Colon polyps Father    Diabetes Father    Cirrhosis Father    Liver cancer Father    Liver disease Father    Alcoholism Father    Depression Father    Alcohol abuse Father    Sleep apnea Father    Irritable bowel syndrome Sister    Sleep apnea Brother    Breast cancer Maternal Aunt    Alcoholism Maternal Aunt    Alcoholism Maternal Grandmother    Alcoholism Maternal Grandfather    Breast cancer Paternal Grandmother    Colon cancer Paternal Grandmother    Esophageal cancer Paternal Grandfather    Stomach cancer Paternal Grandfather    Alcoholism Paternal Grandfather    Rectal cancer Neg Hx     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   DDD (degenerative disc disease), lumbar    Depression    Diabetes (HCC)    Diverticulosis    Drug use    Elevated LFTs    Fibromyalgia    GERD (gastroesophageal reflux disease)    History of ETOH abuse    Hyperlipidemia    Hyperplastic colon polyp    Hyponatremia    in the past   Internal hemorrhoids    Migraines    NAFLD (nonalcoholic fatty liver disease)    Obese    Obstructive sleep apnea 02/25/2015   not diagnosed per pt 04-03-20   Osteoarthritis    lower back- DDD   Periodic limb movement disorder 02/25/2014   Pre-diabetes    pt taking metformin   RLS (restless legs syndrome)    Sleep apnea    Smoker    Snoring    Substance abuse (HCC)    Swallowing difficulty    Vitamin B12 deficiency    Vitamin D deficiency     Past Surgical History:  Procedure Laterality Date   CERVICAL FUSION      COLONOSCOPY  CYSTOSCOPY  06/06/2013   Procedure: CYSTOSCOPY;  Surgeon: Ok Edwards, MD;  Location: [facility];  Service: Gynecology;;   EXCISION METACARPAL MASS Right 09/03/2021   Procedure: EXCISION METACARPAL MASS RIGHT SMALL FINGER;  Surgeon: Cindee Salt, MD;  Location: [facility];  Service: Orthopedics;  Laterality: Right;   INTRAUTERINE DEVICE INSERTION  08/09/2008   PARAGUARD- removed   kidney surgery  1995   abcess   LAPAROSCOPY N/A 06/06/2013   Procedure: LAPAROSCOPY DIAGNOSTIC;  Surgeon: Ok Edwards, MD;  Location: [facility];  Service: Gynecology;  Laterality: N/A;   LAPAROTOMY  06/06/2013   Procedure: EXPLORATORY LAPAROTOMY;  Surgeon: Ok Edwards, MD;  Location: [facility];  Service: Gynecology;;   LIVER BIOPSY     partial right kideny  Right    radial tunnel Bilateral 2005   VAGINAL HYSTERECTOMY Left 06/06/2013   Procedure: Vaginal Hysterectomy with Patiial Right Salpingectomy;  Surgeon: Ok Edwards, MD;  Location: [facility];  Service: Gynecology;  Laterality: Left;     Current Outpatient Medications on File Prior to Visit  Medication Sig Dispense Refill   albuterol (VENTOLIN HFA) 108 (90 Base) MCG/ACT inhaler Inhale 2 puffs into the lungs every 4 (four) hours as needed for wheezing or shortness of breath.     amLODipine (NORVASC) 5 MG tablet Take 1 tablet (5 mg total) by mouth daily. 30 tablet 2   azelastine (ASTELIN) 0.1 % nasal spray Place 2 sprays into both nostrils 2 (two) times daily. 30 mL 12   B Complex Vitamins (B COMPLEX PO) Take 1 tablet by mouth daily.     blood glucose meter kit and supplies KIT Dispense based on patient and insurance preference. Use up to four times daily as directed. (FOR ICD-9 250.00, 250.01). 1 each 0   Blood Glucose Monitoring Suppl (ONETOUCH VERIO FLEX SYSTEM) w/Device KIT Use to check Blood sugars twice daily before meals 1 kit 0   Brexpiprazole (REXULTI) 4 MG TABS Take 4 mg by mouth daily.     Calcium  Carbonate-Vitamin D 600-400 MG-UNIT tablet Take 1 tablet by mouth daily.     carvedilol (COREG) 6.25 MG tablet TAKE 1 TABLET(6.25 MG) BY MOUTH TWICE DAILY WITH A MEAL 60 tablet 1   cetirizine (ZYRTEC) 10 MG tablet Take 10 mg by mouth daily.     Coenzyme Q10 (COQ10) 100 MG CAPS Take 100 mg by mouth daily.     D3-50 1.25 MG (50000 UT) capsule Take 50,000 Units by mouth once a week.     estradiol (VIVELLE-DOT) 0.05 MG/24HR patch Place 1 patch (0.05 mg total) onto the skin 2 (two) times a week. 8 patch 12   fluticasone (CUTIVATE) 0.05 % cream Apply 1 Application topically 2 (two) times daily as needed (Behind ear/Behind belly flap).     fluticasone (FLONASE) 50 MCG/ACT nasal spray Place 2 sprays into both nostrils daily.     glucose blood (CONTOUR NEXT TEST) test strip Use to test blood glucose twice daily 100 each 5   hydrOXYzine (ATARAX) 10 MG tablet Take 20 mg by mouth daily as needed for anxiety.     ibuprofen (ADVIL) 800 MG tablet TAKE 1 TABLET(800 MG) BY MOUTH EVERY 8 HOURS AS NEEDED FOR MODERATE PAIN 30 tablet 3   Lancets (ONETOUCH DELICA PLUS LANCET30G) MISC USE 1  TO CHECK GLUCOSE 4 TIMES DAILY 100 each 0   Magnesium 500 MG CAPS Take 500 mg by mouth at bedtime.     Multiple Vitamins-Minerals (MULTIVITAMIN PO)  Take 1 tablet by mouth daily.      OMEGA 3-6-9 FATTY ACIDS PO Take 500 mg by mouth daily.     oxcarbazepine (TRILEPTAL) 600 MG tablet Take 600 mg by mouth 2 (two) times daily.     rizatriptan (MAXALT) 10 MG tablet TAKE 1 TABLET BY MOUTH AS NEEDED FOR MIGRAINE 10 tablet 2   thiamine (VITAMIN B-1) 50 MG tablet Take 50 mg by mouth daily.     tirzepatide (MOUNJARO) 15 MG/0.5ML Pen Inject 15 mg into the skin once a week. 6 mL 0   vitamin C (ASCORBIC ACID) 500 MG tablet Take 500 mg by mouth daily.     VITAMIN E PO Take 900 mg by mouth daily.     rosuvastatin (CRESTOR) 20 MG tablet Take 1 tablet (20 mg total) by mouth daily. 90 tablet 3   No current facility-administered medications on file  prior to visit.    Allergies  Allergen Reactions   Benadryl [Diphenhydramine Hcl] Hives   Ropinirole Other (See Comments)   Levofloxacin Other (See Comments)   Aspirin    Codeine    Doxycycline    Lisinopril-Hydrochlorothiazide Other (See Comments)    Dry cough, dry eyes, insomnia, emotionally liable, increased thirst, brain fog, fatigue.   Metformin And Related     Blurred vision   Nuvigil [Armodafinil]     migraines    Olmesartan Other (See Comments)    May have contributed to hyponatremia.  Baseline sodium 131-133.   Requip [Ropinirole Hcl]     Increased headache   Semaglutide Other (See Comments)    Rybelsus caused depression and fibromyalgia flair.   Tramadol     Other reaction(s): Other (See Comments)   Wellbutrin [Bupropion]    Zolpidem      DIAGNOSTIC DATA (LABS, IMAGING, TESTING) - I reviewed patient records, labs, notes, testing and imaging myself where available.  Lab Results  Component Value Date   WBC 9.8 07/20/2023   HGB 12.7 07/20/2023   HCT 38.5 07/20/2023   MCV 89.5 07/20/2023   PLT 266 07/20/2023      Component Value Date/Time   NA 135 12/05/2023 1447   NA 129 (L) 11/02/2023 1319   K 5.1 12/05/2023 1447   CL 101 12/05/2023 1447   CO2 26 12/05/2023 1447   GLUCOSE 90 12/05/2023 1447   BUN 13 12/05/2023 1447   BUN 10 11/02/2023 1319   CREATININE 0.71 12/05/2023 1447   CREATININE 0.61 09/19/2020 0847   CALCIUM 9.4 12/05/2023 1447   PROT 7.3 07/13/2023 0941   PROT 6.8 07/14/2022 0934   ALBUMIN 4.1 07/13/2023 0941   ALBUMIN 4.2 07/14/2022 0934   AST 19 07/13/2023 0941   ALT 21 07/13/2023 0941   ALKPHOS 55 07/13/2023 0941   BILITOT 0.3 07/13/2023 0941   BILITOT <0.2 07/14/2022 0934   GFRNONAA >60 07/20/2023 0553   GFRNONAA 106 09/19/2020 0847   GFRAA 123 09/19/2020 0847   Lab Results  Component Value Date   CHOL 259 (H) 07/13/2023   HDL 44.20 07/13/2023   LDLCALC 176 (H) 07/13/2023   LDLDIRECT 145.0 07/18/2019   TRIG 195.0 (H)  07/13/2023   CHOLHDL 6 07/13/2023   Lab Results  Component Value Date   HGBA1C 5.2 07/13/2023   Lab Results  Component Value Date   VITAMINB12 608 08/07/2021   Lab Results  Component Value Date   TSH 1.94 07/13/2023    PHYSICAL EXAM:  Today's Vitals   12/26/23 0939  Weight: 219 lb 3.2  oz (99.4 kg)  Height: 5' 4.5" (1.638 m)   Body mass index is 37.04 kg/m.   Wt Readings from Last 3 Encounters:  12/26/23 219 lb 3.2 oz (99.4 kg)  12/20/23 216 lb (98 kg)  12/09/23 222 lb (100.7 kg)     Ht Readings from Last 3 Encounters:  12/26/23 5' 4.5" (1.638 m)  12/20/23 5' 4.5" (1.638 m)  12/09/23 5' 4.5" (1.638 m)      General: The patient is awake, alert and appears not in acute distress. The patient is well groomed. She is no longer uncontrolled in HTN/  Head: Normocephalic, atraumatic.  Neck is supple. Mallampati 3  neck circumference:15.25 inches . Nasal airflow congested , septal deviation, right side is narrow-always.   Retrognathia is not seen.  Dental status: good.  Cardiovascular:  Regular rate and cardiac rhythm by pulse,  without distended neck veins. Respiratory: Lungs are clear to auscultation.  Skin:  Without evidence of ankle edema, or rash. Trunk: The patient's posture is erect.   NEUROLOGIC EXAM: The patient is awake and alert, oriented to place and time.   Memory subjective described as intact.  Attention span & concentration ability appears normal.  Speech is fluent,  without  dysarthria, dysphonia or aphasia.  Mood and affect are appropriate. Extraocular movements in vertical and horizontal planes were intact and without nystagmus. No Diplopia. Visual fields by finger perimetry are intact. Hearing was intact to soft voice and finger rubbing.    Facial sensation intact to fine touch.  Facial motor strength is symmetric and tongue and uvula move midline.  Neck ROM : rotation, tilt and flexion extension were normal for age and shoulder shrug was  symmetrical.    ASSESSMENT AND PLAN 53 y.o. year old female  here with:    1) weight loss, sobriety and related sleep improvement  renders CPAP unnecessary-  No orders of the defined types were placed in this encounter.   PRN   I would like to thank Deeann Saint, MD and Deeann Saint, Md [address] [place],  [place] [address] for allowing me to meet with and to take care of this pleasant patient.    After spending a total time of  6  minutes face to face and additional time for physical and neurologic examination, review of laboratory studies,  personal review of imaging studies, reports and results of other testing and review of referral information / records as far as provided in visit,   Electronically signed by: Melvyn Novas, MD 12/26/2023 9:44 AM  [facility] and [facility] Sleep Board certified by The [employer] of Sleep Medicine and Diplomate of the [employer] of Sleep Medicine. Board certified In Neurology through the ABPN, Fellow of the [employer] of Neurology.

## 2023-12-27 LAB — LIPID PANEL
Chol/HDL Ratio: 3.1 {ratio} (ref 0.0–4.4)
Cholesterol, Total: 165 mg/dL (ref 100–199)
HDL: 53 mg/dL (ref >39)
LDL Chol Calc (NIH): 96 mg/dL (ref 0–99)
Triglycerides: 87 mg/dL (ref 0–149)
VLDL Cholesterol Cal: 16 mg/dL (ref 5–40)

## 2023-12-27 LAB — COMPREHENSIVE METABOLIC PANEL
ALT: 30 [IU]/L (ref 0–32)
AST: 27 [IU]/L (ref 0–40)
Albumin: 4.3 g/dL (ref 3.8–4.9)
Alkaline Phosphatase: 88 [IU]/L (ref 44–121)
BUN/Creatinine Ratio: 14 (ref 9–23)
BUN: 10 mg/dL (ref 6–24)
Bilirubin Total: 0.3 mg/dL (ref 0.0–1.2)
CO2: 21 mmol/L (ref 20–29)
Calcium: 8.9 mg/dL (ref 8.7–10.2)
Chloride: 100 mmol/L (ref 96–106)
Creatinine, Ser: 0.71 mg/dL (ref 0.57–1.00)
Globulin, Total: 2.6 g/dL (ref 1.5–4.5)
Glucose: 85 mg/dL (ref 70–99)
Potassium: 5.2 mmol/L (ref 3.5–5.2)
Sodium: 136 mmol/L (ref 134–144)
Total Protein: 6.9 g/dL (ref 6.0–8.5)
eGFR: 102 mL/min/{1.73_m2} (ref >59)

## 2023-12-27 LAB — LIPOPROTEIN A (LPA): Lipoprotein (a): 165.2 nmol/L — ABNORMAL HIGH (ref <75.0)

## 2023-12-28 ENCOUNTER — Other Ambulatory Visit: Payer: Self-pay

## 2023-12-28 ENCOUNTER — Inpatient Hospital Stay (HOSPITAL_BASED_OUTPATIENT_CLINIC_OR_DEPARTMENT_OTHER)
Admission: EM | Admit: 2023-12-28 | Discharge: 2023-12-30 | DRG: 287 | Disposition: A | Discharge status: Home / Self Care | Payer: Managed Care, Other (non HMO) | Attending: Cardiology | Admitting: Cardiology

## 2023-12-28 ENCOUNTER — Encounter (HOSPITAL_BASED_OUTPATIENT_CLINIC_OR_DEPARTMENT_OTHER): Payer: Self-pay | Admitting: Emergency Medicine

## 2023-12-28 ENCOUNTER — Emergency Department (HOSPITAL_BASED_OUTPATIENT_CLINIC_OR_DEPARTMENT_OTHER): Payer: Managed Care, Other (non HMO) | Admitting: Radiology

## 2023-12-28 DIAGNOSIS — Z7982 Long term (current) use of aspirin: Secondary | ICD-10-CM

## 2023-12-28 DIAGNOSIS — Z6837 Body mass index (BMI) 37.0-37.9, adult: Secondary | ICD-10-CM

## 2023-12-28 DIAGNOSIS — E782 Mixed hyperlipidemia: Secondary | ICD-10-CM | POA: Diagnosis present

## 2023-12-28 DIAGNOSIS — Z800 Family history of malignant neoplasm of digestive organs: Secondary | ICD-10-CM

## 2023-12-28 DIAGNOSIS — Z79899 Other long term (current) drug therapy: Secondary | ICD-10-CM

## 2023-12-28 DIAGNOSIS — Z90710 Acquired absence of both cervix and uterus: Secondary | ICD-10-CM

## 2023-12-28 DIAGNOSIS — I200 Unstable angina: Secondary | ICD-10-CM | POA: Diagnosis present

## 2023-12-28 DIAGNOSIS — Z7985 Long-term (current) use of injectable non-insulin antidiabetic drugs: Secondary | ICD-10-CM

## 2023-12-28 DIAGNOSIS — K219 Gastro-esophageal reflux disease without esophagitis: Secondary | ICD-10-CM | POA: Diagnosis present

## 2023-12-28 DIAGNOSIS — I10 Essential (primary) hypertension: Secondary | ICD-10-CM | POA: Diagnosis present

## 2023-12-28 DIAGNOSIS — Z886 Allergy status to analgesic agent status: Secondary | ICD-10-CM

## 2023-12-28 DIAGNOSIS — I25110 Atherosclerotic heart disease of native coronary artery with unstable angina pectoris: Secondary | ICD-10-CM | POA: Diagnosis present

## 2023-12-28 DIAGNOSIS — Z885 Allergy status to narcotic agent status: Secondary | ICD-10-CM

## 2023-12-28 DIAGNOSIS — Z803 Family history of malignant neoplasm of breast: Secondary | ICD-10-CM

## 2023-12-28 DIAGNOSIS — M797 Fibromyalgia: Secondary | ICD-10-CM | POA: Diagnosis present

## 2023-12-28 DIAGNOSIS — Z888 Allergy status to other drugs, medicaments and biological substances status: Secondary | ICD-10-CM

## 2023-12-28 DIAGNOSIS — Z881 Allergy status to other antibiotic agents status: Secondary | ICD-10-CM

## 2023-12-28 DIAGNOSIS — G2581 Restless legs syndrome: Secondary | ICD-10-CM | POA: Diagnosis present

## 2023-12-28 DIAGNOSIS — F32A Depression, unspecified: Secondary | ICD-10-CM | POA: Diagnosis present

## 2023-12-28 DIAGNOSIS — Z818 Family history of other mental and behavioral disorders: Secondary | ICD-10-CM

## 2023-12-28 DIAGNOSIS — B974 Respiratory syncytial virus as the cause of diseases classified elsewhere: Secondary | ICD-10-CM | POA: Diagnosis present

## 2023-12-28 DIAGNOSIS — E7841 Elevated Lipoprotein(a): Secondary | ICD-10-CM | POA: Diagnosis present

## 2023-12-28 DIAGNOSIS — Z981 Arthrodesis status: Secondary | ICD-10-CM

## 2023-12-28 DIAGNOSIS — Z860102 Personal history of hyperplastic colon polyps: Secondary | ICD-10-CM

## 2023-12-28 DIAGNOSIS — B338 Other specified viral diseases: Secondary | ICD-10-CM

## 2023-12-28 DIAGNOSIS — I2510 Atherosclerotic heart disease of native coronary artery without angina pectoris: Secondary | ICD-10-CM

## 2023-12-28 DIAGNOSIS — Z833 Family history of diabetes mellitus: Secondary | ICD-10-CM

## 2023-12-28 DIAGNOSIS — R0789 Other chest pain: Secondary | ICD-10-CM | POA: Diagnosis not present

## 2023-12-28 DIAGNOSIS — F1011 Alcohol abuse, in remission: Secondary | ICD-10-CM | POA: Diagnosis present

## 2023-12-28 DIAGNOSIS — Z87891 Personal history of nicotine dependence: Secondary | ICD-10-CM

## 2023-12-28 DIAGNOSIS — R079 Chest pain, unspecified: Principal | ICD-10-CM

## 2023-12-28 DIAGNOSIS — F419 Anxiety disorder, unspecified: Secondary | ICD-10-CM | POA: Diagnosis present

## 2023-12-28 DIAGNOSIS — E119 Type 2 diabetes mellitus without complications: Secondary | ICD-10-CM | POA: Diagnosis present

## 2023-12-28 DIAGNOSIS — Z83719 Family history of colon polyps, unspecified: Secondary | ICD-10-CM

## 2023-12-28 DIAGNOSIS — E669 Obesity, unspecified: Secondary | ICD-10-CM | POA: Diagnosis present

## 2023-12-28 DIAGNOSIS — Z811 Family history of alcohol abuse and dependence: Secondary | ICD-10-CM

## 2023-12-28 LAB — BASIC METABOLIC PANEL
Anion gap: 7 (ref 5–15)
BUN: 14 mg/dL (ref 6–20)
CO2: 26 mmol/L (ref 22–32)
Calcium: 8.9 mg/dL (ref 8.9–10.3)
Chloride: 102 mmol/L (ref 98–111)
Creatinine, Ser: 0.56 mg/dL (ref 0.44–1.00)
GFR, Estimated: >60 mL/min (ref >60)
Glucose, Bld: 87 mg/dL (ref 70–99)
Potassium: 4.2 mmol/L (ref 3.5–5.1)
Sodium: 135 mmol/L (ref 135–145)

## 2023-12-28 LAB — TROPONIN I (HIGH SENSITIVITY)
Troponin I (High Sensitivity): 6 ng/L (ref <18)
Troponin I (High Sensitivity): 6 ng/L (ref <18)

## 2023-12-28 LAB — RESP PANEL BY RT-PCR (RSV, FLU A&B, COVID)  RVPGX2
Influenza A by PCR: NEGATIVE
Influenza B by PCR: NEGATIVE
Resp Syncytial Virus by PCR: POSITIVE — AB
SARS Coronavirus 2 by RT PCR: NEGATIVE

## 2023-12-28 LAB — CBC
HCT: 43.1 % (ref 36.0–46.0)
Hemoglobin: 14.1 g/dL (ref 12.0–15.0)
MCH: 29.8 pg (ref 26.0–34.0)
MCHC: 32.7 g/dL (ref 30.0–36.0)
MCV: 91.1 fL (ref 80.0–100.0)
Platelets: 257 10*3/uL (ref 150–400)
RBC: 4.73 MIL/uL (ref 3.87–5.11)
RDW: 14.2 % (ref 11.5–15.5)
WBC: 9.8 10*3/uL (ref 4.0–10.5)
nRBC: 0 % (ref 0.0–0.2)

## 2023-12-28 LAB — D-DIMER, QUANTITATIVE: D-Dimer, Quant: 0.37 ug{FEU}/mL (ref 0.00–0.50)

## 2023-12-28 MED ORDER — HEPARIN BOLUS VIA INFUSION
4000.0000 [IU] | Freq: Once | INTRAVENOUS | Status: AC
  Administered 2023-12-28: 4000 [IU] via INTRAVENOUS

## 2023-12-28 MED ORDER — NITROGLYCERIN 0.4 MG SL SUBL
0.4000 mg | SUBLINGUAL_TABLET | SUBLINGUAL | Status: DC | PRN
  Administered 2023-12-28 – 2023-12-29 (×5): 0.4 mg via SUBLINGUAL
  Filled 2023-12-28 (×3): qty 1

## 2023-12-28 MED ORDER — HEPARIN (PORCINE) 25000 UT/250ML-% IV SOLN
1100.0000 [IU]/h | INTRAVENOUS | Status: DC
  Administered 2023-12-28: 950 [IU]/h via INTRAVENOUS
  Filled 2023-12-28: qty 250

## 2023-12-28 NOTE — ED Triage Notes (Signed)
 Tightness chest pain, sob pain when taking a deep breath Started last night

## 2023-12-28 NOTE — ED Notes (Addendum)
 Patient brought to triage for re check.  VS updated  PIV via ultrasound placed labs sent

## 2023-12-28 NOTE — ED Notes (Signed)
 Unable to obtain blood, states will need ultrasound for blood draw

## 2023-12-28 NOTE — ED Provider Notes (Signed)
 53 yo female presenting with chest pain and tightness ECG and troponins nonischemic RSV test positive but no acute URI symptoms  Pending Ddimer, 2nd trop  Physical Exam  BP 138/65   Pulse 69   Temp 97.7 F (36.5 C) (Temporal)   Resp 13   LMP 04/25/2013   SpO2 98%   Physical Exam  Procedures  .Critical Care  Performed by: Cottie Donnice PARAS, MD Authorized by: Cottie Donnice PARAS, MD   Critical care provider statement:    Critical care time (minutes):  40   Critical care time was exclusive of:  Separately billable procedures and treating other patients   Critical care was necessary to treat or prevent imminent or life-threatening deterioration of the following conditions:  Circulatory failure   Critical care was time spent personally by me on the following activities:  Ordering and performing treatments and interventions, ordering and review of laboratory studies, ordering and review of radiographic studies, pulse oximetry, review of old charts, examination of patient and evaluation of patient's response to treatment   ED Course / MDM   Clinical Course as of 12/29/23 0028  Wed Dec 28, 2023  1826 I spoke to Dr Ritchie cardiology who advises admission to cardiology service for unstable angina, start heparin , NPO at midnight, potential cardiac cath tomorrow.  The patient and her son by phone were updated and in agreement with plan. [MT]    Clinical Course User Index [MT] Judithann Villamar, Donnice PARAS, MD   Medical Decision Making Amount and/or Complexity of Data Reviewed Labs: ordered. Radiology: ordered.  Risk Prescription drug management. Decision regarding hospitalization.   Patient reassessed and still having some substernal chest pressure but otherwise asymptomatic.  I reviewed her external records including her coronary CT scan performed 2 months ago in December, which shows single-vessel disease with a mid LAD calcification in the 94th percentile.  She has been given sublingual  nitroglycerin  now, will reassess shortly.  *  Admitted for unstable angina - likely cath on 12/29/23 in the AM - will need to be transferred to [facility] hospital if no bed available tonight (considering ED transfer).  NPO at midnight   Cottie Donnice PARAS, MD 12/29/23 [phone]

## 2023-12-28 NOTE — Progress Notes (Signed)
 ANTICOAGULATION CONSULT NOTE - Initial Consult  Pharmacy Consult for Heparin  Indication:  unstable angina  Allergies  Allergen Reactions   Benadryl  [Diphenhydramine  Hcl] Hives   Ropinirole  Other (See Comments)   Levofloxacin Other (See Comments)   Aspirin     Codeine    Doxycycline    Lisinopril -Hydrochlorothiazide  Other (See Comments)    Dry cough, dry eyes, insomnia, emotionally liable, increased thirst, brain fog, fatigue.   Metformin  And Related     Blurred vision   Nuvigil  [Armodafinil ]     migraines    Olmesartan  Other (See Comments)    May have contributed to hyponatremia.  Baseline sodium 131-133.   Requip  [Ropinirole  Hcl]     Increased headache   Semaglutide  Other (See Comments)    Rybelsus  caused depression and fibromyalgia flair.   Tramadol      Other reaction(s): Other (See Comments)   Wellbutrin [Bupropion]    Zolpidem      Patient Measurements:   Heparin  Dosing Weight: 78.7 kg  Vital Signs: Temp: 98 F (36.7 C) (02/05 1341) BP: 149/86 (02/05 1745) Pulse Rate: 79 (02/05 1745)  Labs: Recent Labs    12/26/23 1054 12/28/23 1340 12/28/23 1515  HGB  --  14.1  --   HCT  --  43.1  --   PLT  --  257  --   CREATININE 0.71 0.56  --   TROPONINIHS  --  6 6    Estimated Creatinine Clearance: 94.1 mL/min (by C-G formula based on SCr of 0.56 mg/dL).   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   DDD (degenerative disc disease), lumbar    Depression    Diabetes (HCC)    Diverticulosis    Drug use    Elevated LFTs    Fibromyalgia    GERD (gastroesophageal reflux disease)    History of ETOH abuse    Hyperlipidemia     Hyperplastic colon polyp    Hyponatremia    in the past   Internal hemorrhoids    Migraines    NAFLD (nonalcoholic fatty liver disease)    Obese    Obstructive sleep apnea 02/25/2015   not diagnosed per pt 04-03-20   Osteoarthritis    lower back- DDD   Periodic limb movement disorder 02/25/2014   Pre-diabetes    pt taking metformin    RLS (restless legs syndrome)    Sleep apnea    Smoker    Snoring    Substance abuse (HCC)    Swallowing difficulty    Vitamin B12 deficiency    Vitamin D  deficiency     Medications:  (Not in a hospital admission)  Scheduled:  Infusions:  PRN: nitroGLYCERIN   Assessment: 52 yof presenting with chest pain and tightness. Heparin  per pharmacy consult placed for  unstable angina . Noted to be RSV positive.  Patient is not on anticoagulation prior to arrival.  Hgb 13.4; plt 257  Goal of Therapy:  Heparin  level 0.3-0.7 units/ml Monitor platelets by anticoagulation protocol: Yes   Plan:  Give IV heparin  4000 units  bolus x 1 Start heparin  infusion at 950 units/hr Check anti-Xa level in 6 hours and daily while on heparin  Continue to monitor H&H and platelets  Dorn Buttner, PharmD, BCPS 12/28/2023 6:31 PM ED Clinical Pharmacist -  [phone]

## 2023-12-28 NOTE — ED Provider Notes (Signed)
 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   DDD (degenerative disc disease), lumbar    Depression    Diabetes (HCC)    Diverticulosis    Drug use    Elevated LFTs    Fibromyalgia    GERD (gastroesophageal reflux disease)    History of ETOH abuse    Hyperlipidemia    Hyperplastic colon polyp    Hyponatremia    in the past   Internal hemorrhoids    Migraines    NAFLD (nonalcoholic fatty liver disease)    Obese    Obstructive sleep apnea 02/25/2015   not diagnosed per pt 04-03-20   Osteoarthritis    lower back- DDD   Periodic limb movement disorder 02/25/2014   Pre-diabetes    pt taking metformin    RLS (restless legs syndrome)    Sleep apnea    Smoker    Snoring    Substance abuse (HCC)    Swallowing difficulty    Vitamin B12 deficiency    Vitamin D  deficiency    Chief Complaint  Patient presents with   Chest Pain    Gloria [name] is a 53 year old female presenting to the ED with a 1 day history of chest pain and tightness.  Patient notes that yesterday she began experiencing a chest tightness that she described as an elephant sitting on her chest.  She notes that this worsens with inspiration.  She also developed intermittent chest pain that does not radiate down her arm or jaw.  She denies any fever, chills, sweats, or cough.  The chest pain and tightness occurs at rest and is not improved or worsened with exertion.  She says nothing makes it better or worse.  She took an ibuprofen  yesterday with no alleviation.  She  denies any recent sick contacts.   Chest Pain Pain location:  Substernal area Pain quality: pressure   Pain radiates to:  Does not radiate Pain severity:  Mild Onset quality:  Sudden Duration:  1 day Timing:  Constant Progression:  Unchanged Chronicity:  New Context: at rest   Relieved by:  Nothing Worsened by:  Nothing Associated symptoms: no fever, no headache and no nausea   Risk factors: diabetes mellitus and hypertension    Home Medications Prior to Admission medications   Medication Sig Start Date End Date Taking? Authorizing Provider  albuterol  (VENTOLIN  HFA) 108 (90 Base) MCG/ACT inhaler Inhale 2 puffs into the lungs every 4 (four) hours as needed for wheezing or shortness of breath. 03/02/23   [provider]  amLODipine  (NORVASC ) 5 MG tablet Take 1 tablet (5 mg total) by mouth daily. 12/05/23   Johnny Garnette LABOR, MD  azelastine  (ASTELIN ) 0.1 % nasal spray Place 2 sprays into both nostrils 2 (two) times daily. 09/10/22   Parker, Caleb M, MD  B Complex Vitamins (B COMPLEX PO) Take 1 tablet by mouth daily.    [provider]  blood glucose meter kit and supplies KIT Dispense based on patient and insurance preference. Use up to four times daily as directed. (FOR ICD-9 250.00, 250.01). 04/11/19   Mercer Clotilda SAUNDERS, MD  Blood Glucose Monitoring Suppl (ONETOUCH VERIO FLEX SYSTEM) w/Device KIT Use to check Blood sugars twice daily before meals 04/12/19   Mercer Clotilda SAUNDERS, MD  Brexpiprazole  (REXULTI ) 4 MG TABS Take 4 mg by mouth daily.    [provider]  Calcium  Carbonate-Vitamin D  600-400 MG-UNIT tablet Take 1 tablet by mouth daily.    [provider]  carvedilol  (COREG ) 6.25 MG tablet TAKE 1 TABLET(6.25 MG) BY MOUTH TWICE DAILY WITH A MEAL 12/02/23   Mercer Clotilda SAUNDERS, MD  cetirizine (ZYRTEC) 10 MG tablet Take 10 mg by mouth daily.    [provider]  Coenzyme Q10 (COQ10) 100 MG CAPS Take 100 mg by mouth daily.    [provider]  D3-50  1.25 MG (50000 UT) capsule Take 50,000 Units by mouth once a week. 06/08/23   [provider]  estradiol  (VIVELLE -DOT) 0.05 MG/24HR patch Place 1 patch (0.05 mg total) onto the skin 2 (two) times a week. 12/12/23   Glennon Almarie POUR, MD  fluticasone  (CUTIVATE ) 0.05 % cream Apply 1 Application topically 2 (two) times daily as needed (Behind ear/Behind belly flap).    [provider]  fluticasone  (FLONASE ) 50 MCG/ACT nasal spray Place 2 sprays into both nostrils daily. 10/03/23   [provider]  glucose blood (CONTOUR NEXT TEST) test strip Use to test blood glucose twice daily 01/11/20   Mercer Clotilda SAUNDERS, MD  hydrOXYzine  (ATARAX ) 10 MG tablet Take 20 mg by mouth daily as needed for anxiety. 11/23/22   [provider]  ibuprofen  (ADVIL ) 800 MG tablet TAKE 1 TABLET(800 MG) BY MOUTH EVERY 8 HOURS AS NEEDED FOR MODERATE PAIN 03/25/21   Mercer Clotilda SAUNDERS, MD  Lancets [facility] DELICA PLUS LANCET30G) MISC USE 1  TO CHECK GLUCOSE 4 TIMES DAILY 10/11/19   Mercer Clotilda SAUNDERS, MD  Magnesium  500 MG CAPS Take 500 mg by mouth at bedtime.    [provider]  Multiple Vitamins-Minerals (MULTIVITAMIN PO) Take 1 tablet by mouth daily.     [provider]  OMEGA 3-6-9 FATTY ACIDS PO Take 500 mg by mouth daily.    [provider]  oxcarbazepine  (TRILEPTAL ) 600 MG tablet Take 600 mg by mouth 2 (two) times daily. 10/22/21   [provider]  rizatriptan  (MAXALT ) 10 MG tablet TAKE 1 TABLET BY MOUTH AS NEEDED FOR MIGRAINE 03/03/23   Mercer Clotilda SAUNDERS, MD  rosuvastatin  (CRESTOR ) 20 MG tablet Take 1 tablet (20 mg total) by mouth daily. 09/26/23 12/25/23  Burnard Debby LABOR, MD  thiamine  (VITAMIN B-1) 50 MG tablet Take 50 mg by mouth daily.    [provider]  tirzepatide  (MOUNJARO ) 15 MG/0.5ML Pen Inject 15 mg into the skin once a week. 12/20/23   Verdon Parry D, MD  vitamin C  (ASCORBIC ACID ) 500 MG tablet Take 500 mg by mouth daily.    [provider]   VITAMIN E PO Take 900 mg by mouth daily.    [provider]      Allergies    Benadryl  [diphenhydramine  hcl], Ropinirole , Levofloxacin, Aspirin , Codeine, Doxycycline, Lisinopril -hydrochlorothiazide , Metformin  and related, Nuvigil  [armodafinil ], Olmesartan , Requip  [ropinirole  hcl], Semaglutide , Tramadol , Wellbutrin [bupropion], and Zolpidem     Review of Systems   Review of Systems  Constitutional:  Negative for fever.  Cardiovascular:  Positive for chest  pain.  Gastrointestinal:  Negative for nausea.  Neurological:  Negative for headaches.   Physical Exam Updated Vital Signs BP (!) 165/76 (BP Location: Left Wrist)   Pulse 67   Temp 98 F (36.7 C)   Resp 17   LMP 04/25/2013   SpO2 99%  Physical Exam Constitutional:      Appearance: She is well-developed.  HENT:     Head: Normocephalic and atraumatic.  Cardiovascular:     Rate and Rhythm: Normal rate and regular rhythm.     Heart sounds: Normal heart sounds.  Pulmonary:     Effort: Pulmonary effort is normal.     Breath sounds: Normal breath sounds.  Abdominal:     General: Abdomen is flat. Bowel sounds are normal.     Palpations: Abdomen is soft.  Musculoskeletal:     Right lower leg: No edema.     Left lower leg: No edema.  Neurological:     General: No focal deficit present.     Mental Status: She is alert.    ED Results / Procedures / Treatments   Labs (all labs ordered are listed, but only abnormal results are displayed) Labs Reviewed  RESP PANEL BY RT-PCR (RSV, FLU A&B, COVID)  [id number] - Abnormal; Notable for the following components:      Result Value   Resp Syncytial Virus by PCR POSITIVE (*)    All other components within normal limits  BASIC METABOLIC PANEL  CBC  D-DIMER, QUANTITATIVE  TROPONIN I (HIGH SENSITIVITY)  TROPONIN I (HIGH SENSITIVITY)   EKG NSR  Radiology Unremarkable  Medications Ordered in ED Medications - No data to display  ED Course/ Medical Decision Making/  A&P Medical Decision Making 53 year old female presenting with 1 day history of chest pain and tightness.  Differential diagnosis includes but not limited to costochondritis, ACS, MI, angina, pneumonia, viral infection, or PE.  Amount and/or Complexity of Data Reviewed Labs: ordered.    Details: Tropes, CBC, and BMP within normal limits.  Respiratory panel positive for RSV. D dimer pending at sign-out.  Radiology: ordered.    Details: CXR unremarkable.  ECG/medicine tests: ordered.    Details: NSR Discussion of management or test interpretation with external provider(s): Patient presents with 1 day history of chest pain and chest tightness in the context of multiple risk factors.  EKG demonstrates normal sinus rhythm.  Tropes negative.  Low suspicion for ACS or angina.  While patient tested positive for RSV, she is clinically not symptomatic so lower concern as an etiology for her current presentation.  She denies any fever and has no leukocytosis so low concern for infection. CXR unremarkable. D-dimer pending. Patient signed out to Dr. Arvell at handoff pending further workup.   Risk Prescription drug management. Decision regarding hospitalization.   Final Clinical Impression(s) / ED Diagnoses Final diagnoses:  RSV (respiratory syncytial virus infection)  Acute chest pain    Rx / DC Orders ED Discharge Orders          Ordered    Ambulatory referral to Cardiology       Comments: If you have not heard from the Cardiology office within the next 72 hours please call [phone].   12/28/23 1500              Stephanie Freund, MD 12/29/23 9192    Tonia Chew, MD 01/10/24 [phone]

## 2023-12-28 NOTE — ED Notes (Signed)
 PT complaining of chest pain again. Per EDP repeat EKG needed.

## 2023-12-29 ENCOUNTER — Other Ambulatory Visit: Payer: Self-pay

## 2023-12-29 ENCOUNTER — Encounter (HOSPITAL_COMMUNITY)
Admission: EM | Disposition: A | Discharge status: Home / Self Care | Payer: Self-pay | Source: Home / Self Care | Attending: Cardiology

## 2023-12-29 ENCOUNTER — Ambulatory Visit: Payer: Managed Care, Other (non HMO) | Admitting: Family Medicine

## 2023-12-29 ENCOUNTER — Inpatient Hospital Stay (HOSPITAL_COMMUNITY): Payer: Managed Care, Other (non HMO)

## 2023-12-29 DIAGNOSIS — R0789 Other chest pain: Secondary | ICD-10-CM | POA: Diagnosis present

## 2023-12-29 DIAGNOSIS — I10 Essential (primary) hypertension: Secondary | ICD-10-CM | POA: Diagnosis present

## 2023-12-29 DIAGNOSIS — Z981 Arthrodesis status: Secondary | ICD-10-CM | POA: Diagnosis not present

## 2023-12-29 DIAGNOSIS — G2581 Restless legs syndrome: Secondary | ICD-10-CM | POA: Diagnosis present

## 2023-12-29 DIAGNOSIS — I200 Unstable angina: Secondary | ICD-10-CM | POA: Diagnosis not present

## 2023-12-29 DIAGNOSIS — Z860102 Personal history of hyperplastic colon polyps: Secondary | ICD-10-CM | POA: Diagnosis not present

## 2023-12-29 DIAGNOSIS — E7841 Elevated Lipoprotein(a): Secondary | ICD-10-CM | POA: Diagnosis present

## 2023-12-29 DIAGNOSIS — M797 Fibromyalgia: Secondary | ICD-10-CM | POA: Diagnosis present

## 2023-12-29 DIAGNOSIS — F32A Depression, unspecified: Secondary | ICD-10-CM | POA: Diagnosis present

## 2023-12-29 DIAGNOSIS — Z7985 Long-term (current) use of injectable non-insulin antidiabetic drugs: Secondary | ICD-10-CM | POA: Diagnosis not present

## 2023-12-29 DIAGNOSIS — Z888 Allergy status to other drugs, medicaments and biological substances status: Secondary | ICD-10-CM | POA: Diagnosis not present

## 2023-12-29 DIAGNOSIS — F419 Anxiety disorder, unspecified: Secondary | ICD-10-CM | POA: Diagnosis present

## 2023-12-29 DIAGNOSIS — I25110 Atherosclerotic heart disease of native coronary artery with unstable angina pectoris: Secondary | ICD-10-CM | POA: Diagnosis present

## 2023-12-29 DIAGNOSIS — Z87891 Personal history of nicotine dependence: Secondary | ICD-10-CM | POA: Diagnosis not present

## 2023-12-29 DIAGNOSIS — E119 Type 2 diabetes mellitus without complications: Secondary | ICD-10-CM | POA: Diagnosis present

## 2023-12-29 DIAGNOSIS — F1011 Alcohol abuse, in remission: Secondary | ICD-10-CM | POA: Diagnosis present

## 2023-12-29 DIAGNOSIS — I2510 Atherosclerotic heart disease of native coronary artery without angina pectoris: Secondary | ICD-10-CM | POA: Diagnosis not present

## 2023-12-29 DIAGNOSIS — Z79899 Other long term (current) drug therapy: Secondary | ICD-10-CM | POA: Diagnosis not present

## 2023-12-29 DIAGNOSIS — B974 Respiratory syncytial virus as the cause of diseases classified elsewhere: Secondary | ICD-10-CM | POA: Diagnosis present

## 2023-12-29 DIAGNOSIS — Z885 Allergy status to narcotic agent status: Secondary | ICD-10-CM | POA: Diagnosis not present

## 2023-12-29 DIAGNOSIS — Z886 Allergy status to analgesic agent status: Secondary | ICD-10-CM | POA: Diagnosis not present

## 2023-12-29 DIAGNOSIS — Z881 Allergy status to other antibiotic agents status: Secondary | ICD-10-CM | POA: Diagnosis not present

## 2023-12-29 DIAGNOSIS — Z6837 Body mass index (BMI) 37.0-37.9, adult: Secondary | ICD-10-CM | POA: Diagnosis not present

## 2023-12-29 DIAGNOSIS — E669 Obesity, unspecified: Secondary | ICD-10-CM | POA: Diagnosis present

## 2023-12-29 DIAGNOSIS — Z833 Family history of diabetes mellitus: Secondary | ICD-10-CM | POA: Diagnosis not present

## 2023-12-29 DIAGNOSIS — E782 Mixed hyperlipidemia: Secondary | ICD-10-CM | POA: Diagnosis present

## 2023-12-29 HISTORY — PX: LEFT HEART CATH AND CORONARY ANGIOGRAPHY: CATH118249

## 2023-12-29 LAB — COMPREHENSIVE METABOLIC PANEL
ALT: 29 U/L (ref 0–44)
AST: 20 U/L (ref 15–41)
Albumin: 3.4 g/dL — ABNORMAL LOW (ref 3.5–5.0)
Alkaline Phosphatase: 64 U/L (ref 38–126)
Anion gap: 9 (ref 5–15)
BUN: 10 mg/dL (ref 6–20)
CO2: 25 mmol/L (ref 22–32)
Calcium: 8.2 mg/dL — ABNORMAL LOW (ref 8.9–10.3)
Chloride: 102 mmol/L (ref 98–111)
Creatinine, Ser: 0.54 mg/dL (ref 0.44–1.00)
GFR, Estimated: >60 mL/min (ref >60)
Glucose, Bld: 90 mg/dL (ref 70–99)
Potassium: 3.7 mmol/L (ref 3.5–5.1)
Sodium: 136 mmol/L (ref 135–145)
Total Bilirubin: 0.3 mg/dL (ref 0.0–1.2)
Total Protein: 6.4 g/dL — ABNORMAL LOW (ref 6.5–8.1)

## 2023-12-29 LAB — TYPE AND SCREEN
ABO/RH(D): O POS
Antibody Screen: NEGATIVE

## 2023-12-29 LAB — GLUCOSE, CAPILLARY
Glucose-Capillary: 97 mg/dL (ref 70–99)
Glucose-Capillary: 92 mg/dL (ref 70–99)
Glucose-Capillary: 86 mg/dL (ref 70–99)
Glucose-Capillary: 102 mg/dL — ABNORMAL HIGH (ref 70–99)

## 2023-12-29 LAB — PROTIME-INR
INR: 1.1 (ref 0.8–1.2)
Prothrombin Time: 14.6 s (ref 11.4–15.2)

## 2023-12-29 LAB — CBC
HCT: 38.5 % (ref 36.0–46.0)
Hemoglobin: 13.3 g/dL (ref 12.0–15.0)
MCH: 30.8 pg (ref 26.0–34.0)
MCHC: 34.5 g/dL (ref 30.0–36.0)
MCV: 89.1 fL (ref 80.0–100.0)
Platelets: 245 10*3/uL (ref 150–400)
RBC: 4.32 MIL/uL (ref 3.87–5.11)
RDW: 14.1 % (ref 11.5–15.5)
WBC: 10.6 10*3/uL — ABNORMAL HIGH (ref 4.0–10.5)
nRBC: 0 % (ref 0.0–0.2)

## 2023-12-29 LAB — APTT: aPTT: 64 s — ABNORMAL HIGH (ref 24–36)

## 2023-12-29 LAB — BRAIN NATRIURETIC PEPTIDE: B Natriuretic Peptide: 12.4 pg/mL (ref 0.0–100.0)

## 2023-12-29 LAB — HIV ANTIBODY (ROUTINE TESTING W REFLEX): HIV Screen 4th Generation wRfx: NONREACTIVE

## 2023-12-29 LAB — HEPARIN LEVEL (UNFRACTIONATED): Heparin Unfractionated: 0.29 [IU]/mL — ABNORMAL LOW (ref 0.30–0.70)

## 2023-12-29 LAB — MAGNESIUM: Magnesium: 2 mg/dL (ref 1.7–2.4)

## 2023-12-29 SURGERY — LEFT HEART CATH AND CORONARY ANGIOGRAPHY
Anesthesia: LOCAL

## 2023-12-29 MED ORDER — VERAPAMIL HCL 2.5 MG/ML IV SOLN
INTRAVENOUS | Status: AC
  Filled 2023-12-29: qty 2

## 2023-12-29 MED ORDER — FLUTICASONE PROPIONATE 50 MCG/ACT NA SUSP
2.0000 | Freq: Every day | NASAL | Status: DC
  Filled 2023-12-29: qty 16

## 2023-12-29 MED ORDER — AMLODIPINE BESYLATE 5 MG PO TABS
5.0000 mg | ORAL_TABLET | Freq: Every day | ORAL | Status: DC
  Administered 2023-12-29 – 2023-12-30 (×2): 5 mg via ORAL
  Filled 2023-12-29 (×2): qty 1

## 2023-12-29 MED ORDER — INSULIN ASPART 100 UNIT/ML IJ SOLN
0.0000 [IU] | Freq: Three times a day (TID) | INTRAMUSCULAR | Status: DC
Start: 2023-12-29 — End: 2023-12-30

## 2023-12-29 MED ORDER — MIDAZOLAM HCL 2 MG/2ML IJ SOLN
INTRAMUSCULAR | Status: DC | PRN
  Administered 2023-12-29: 2 mg via INTRAVENOUS

## 2023-12-29 MED ORDER — ADULT MULTIVITAMIN W/MINERALS CH
1.0000 | ORAL_TABLET | Freq: Every day | ORAL | Status: DC
  Administered 2023-12-29 – 2023-12-30 (×2): 1 via ORAL
  Filled 2023-12-29 (×2): qty 1

## 2023-12-29 MED ORDER — SODIUM CHLORIDE 0.9 % WEIGHT BASED INFUSION
1.0000 mL/kg/h | INTRAVENOUS | Status: DC
  Administered 2023-12-29: 1 mL/kg/h via INTRAVENOUS

## 2023-12-29 MED ORDER — ASPIRIN 325 MG PO TBEC
325.0000 mg | DELAYED_RELEASE_TABLET | Freq: Once | ORAL | Status: AC
  Administered 2023-12-29: 325 mg via ORAL
  Filled 2023-12-29: qty 1

## 2023-12-29 MED ORDER — VITAMIN C 500 MG PO TABS
500.0000 mg | ORAL_TABLET | Freq: Every day | ORAL | Status: DC
Start: 2023-12-29 — End: 2023-12-30
  Administered 2023-12-29 – 2023-12-30 (×2): 500 mg via ORAL
  Filled 2023-12-29 (×2): qty 1

## 2023-12-29 MED ORDER — FENTANYL CITRATE (PF) 100 MCG/2ML IJ SOLN
INTRAMUSCULAR | Status: AC
  Filled 2023-12-29: qty 2

## 2023-12-29 MED ORDER — ENOXAPARIN SODIUM 40 MG/0.4ML IJ SOSY
40.0000 mg | PREFILLED_SYRINGE | INTRAMUSCULAR | Status: DC
  Filled 2023-12-29: qty 0.4

## 2023-12-29 MED ORDER — LIDOCAINE HCL (PF) 1 % IJ SOLN
INTRAMUSCULAR | Status: AC
  Filled 2023-12-29: qty 30

## 2023-12-29 MED ORDER — FENTANYL CITRATE (PF) 100 MCG/2ML IJ SOLN
INTRAMUSCULAR | Status: DC | PRN
  Administered 2023-12-29: 25 ug via INTRAVENOUS

## 2023-12-29 MED ORDER — ACETAMINOPHEN 325 MG PO TABS: 650.0000 mg | ORAL_TABLET | ORAL | Status: DC | PRN

## 2023-12-29 MED ORDER — MIDAZOLAM HCL 2 MG/2ML IJ SOLN
INTRAMUSCULAR | Status: AC
  Filled 2023-12-29: qty 2

## 2023-12-29 MED ORDER — ASPIRIN 81 MG PO CHEW: 81.0000 mg | CHEWABLE_TABLET | ORAL | Status: DC

## 2023-12-29 MED ORDER — IOHEXOL 350 MG/ML SOLN
INTRAVENOUS | Status: DC | PRN
  Administered 2023-12-29: 40 mL

## 2023-12-29 MED ORDER — SODIUM CHLORIDE 0.9% FLUSH: 3.0000 mL | INTRAVENOUS | Status: DC | PRN

## 2023-12-29 MED ORDER — THIAMINE MONONITRATE 100 MG PO TABS
50.0000 mg | ORAL_TABLET | Freq: Every day | ORAL | Status: DC
Start: 2023-12-29 — End: 2023-12-30
  Administered 2023-12-29: 50 mg via ORAL
  Filled 2023-12-29: qty 1

## 2023-12-29 MED ORDER — MAGNESIUM OXIDE -MG SUPPLEMENT 400 (240 MG) MG PO TABS
400.0000 mg | ORAL_TABLET | Freq: Every day | ORAL | Status: DC
  Administered 2023-12-29: 400 mg via ORAL
  Filled 2023-12-29: qty 1

## 2023-12-29 MED ORDER — AZELASTINE HCL 0.1 % NA SOLN
2.0000 | Freq: Two times a day (BID) | NASAL | Status: DC
  Administered 2023-12-29 – 2023-12-30 (×2): 2 via NASAL
  Filled 2023-12-29: qty 30

## 2023-12-29 MED ORDER — HEPARIN SODIUM (PORCINE) 1000 UNIT/ML IJ SOLN
INTRAMUSCULAR | Status: AC
Start: 2023-12-29 — End: ?
  Filled 2023-12-29: qty 10

## 2023-12-29 MED ORDER — BREXPIPRAZOLE 2 MG PO TABS
4.0000 mg | ORAL_TABLET | Freq: Every day | ORAL | Status: DC
  Administered 2023-12-29 – 2023-12-30 (×2): 4 mg via ORAL
  Filled 2023-12-29 (×2): qty 2

## 2023-12-29 MED ORDER — B COMPLEX-C PO TABS
1.0000 | ORAL_TABLET | Freq: Every day | ORAL | Status: DC
  Administered 2023-12-29 – 2023-12-30 (×2): 1 via ORAL
  Filled 2023-12-29 (×2): qty 1

## 2023-12-29 MED ORDER — LIDOCAINE HCL (PF) 1 % IJ SOLN
INTRAMUSCULAR | Status: DC | PRN
  Administered 2023-12-29: 2 mL via INTRADERMAL

## 2023-12-29 MED ORDER — LORATADINE 10 MG PO TABS
10.0000 mg | ORAL_TABLET | Freq: Every day | ORAL | Status: DC
  Administered 2023-12-29 – 2023-12-30 (×2): 10 mg via ORAL
  Filled 2023-12-29 (×2): qty 1

## 2023-12-29 MED ORDER — HEPARIN (PORCINE) IN NACL 1000-0.9 UT/500ML-% IV SOLN
INTRAVENOUS | Status: DC | PRN
  Administered 2023-12-29 (×2): 500 mL

## 2023-12-29 MED ORDER — SODIUM CHLORIDE 0.9 % IV SOLN: 250.0000 mL | INTRAVENOUS | Status: DC | PRN

## 2023-12-29 MED ORDER — OXCARBAZEPINE 300 MG PO TABS
600.0000 mg | ORAL_TABLET | Freq: Two times a day (BID) | ORAL | Status: DC
Start: 2023-12-29 — End: 2023-12-30
  Administered 2023-12-29 – 2023-12-30 (×3): 600 mg via ORAL
  Filled 2023-12-29 (×4): qty 2

## 2023-12-29 MED ORDER — COQ10 100 MG PO CAPS: 100.0000 mg | ORAL_CAPSULE | Freq: Every day | ORAL | Status: DC

## 2023-12-29 MED ORDER — VERAPAMIL HCL 2.5 MG/ML IV SOLN
INTRAVENOUS | Status: DC | PRN
  Administered 2023-12-29: 10 mL via INTRA_ARTERIAL

## 2023-12-29 MED ORDER — VITAMIN D (ERGOCALCIFEROL) 1.25 MG (50000 UNIT) PO CAPS: 50000.0000 [IU] | ORAL_CAPSULE | ORAL | Status: DC

## 2023-12-29 MED ORDER — ROSUVASTATIN CALCIUM 20 MG PO TABS
20.0000 mg | ORAL_TABLET | Freq: Every day | ORAL | Status: DC
  Administered 2023-12-29 – 2023-12-30 (×2): 20 mg via ORAL
  Filled 2023-12-29 (×2): qty 1

## 2023-12-29 MED ORDER — HEPARIN SODIUM (PORCINE) 1000 UNIT/ML IJ SOLN
INTRAMUSCULAR | Status: DC | PRN
  Administered 2023-12-29: 5000 [IU] via INTRAVENOUS

## 2023-12-29 MED ORDER — CARVEDILOL 6.25 MG PO TABS
6.2500 mg | ORAL_TABLET | Freq: Two times a day (BID) | ORAL | Status: DC
  Administered 2023-12-29 – 2023-12-30 (×3): 6.25 mg via ORAL
  Filled 2023-12-29 (×3): qty 1

## 2023-12-29 MED ORDER — SODIUM CHLORIDE 0.9% FLUSH
3.0000 mL | Freq: Two times a day (BID) | INTRAVENOUS | Status: DC
  Administered 2023-12-29 – 2023-12-30 (×3): 3 mL via INTRAVENOUS

## 2023-12-29 MED ORDER — ONDANSETRON HCL 4 MG/2ML IJ SOLN: 4.0000 mg | Freq: Four times a day (QID) | INTRAMUSCULAR | Status: DC | PRN

## 2023-12-29 MED ORDER — SODIUM CHLORIDE 0.9 % WEIGHT BASED INFUSION
3.0000 mL/kg/h | INTRAVENOUS | Status: DC
  Administered 2023-12-29: 3 mL/kg/h via INTRAVENOUS

## 2023-12-29 SURGICAL SUPPLY — 8 items
CATH 5FR JL3.5 JR4 ANG PIG MP (CATHETERS) IMPLANT
DEVICE RAD COMP TR BAND LRG (VASCULAR PRODUCTS) IMPLANT
GLIDESHEATH SLEND SS 6F .021 (SHEATH) IMPLANT
GUIDEWIRE INQWIRE 1.5J.035X260 (WIRE) IMPLANT
INQWIRE 1.5J .035X260CM (WIRE) ×1
PACK CARDIAC CATHETERIZATION (CUSTOM PROCEDURE TRAY) ×1 IMPLANT
SET ATX-X65L (MISCELLANEOUS) IMPLANT
SHEATH PROBE COVER 6X72 (BAG) IMPLANT

## 2023-12-29 NOTE — Progress Notes (Signed)
 Attempted 2D echo. Patient is off the floor.

## 2023-12-29 NOTE — H&P (View-Only) (Signed)
 Rounding Note    Patient Name: Gloria [name] Date of Encounter: 12/29/2023  [facility] Cardiologist: Debby Sor, MD --> changing to Dr. Jeffrie  Subjective   Waxing and waning chest pain responsive to NTG. She agrees to cath.  Inpatient Medications    Scheduled Meds:  amLODipine   5 mg Oral Daily   ascorbic acid   500 mg Oral Daily   azelastine   2 spray Each Nare BID   B-complex with vitamin C   1 tablet Oral Daily   brexpiprazole   4 mg Oral Daily   carvedilol   6.25 mg Oral BID WC   fluticasone   2 spray Each Nare Daily   insulin  aspart  0-4 Units Subcutaneous TID WC   loratadine   10 mg Oral Daily   magnesium  oxide  400 mg Oral QHS   multivitamin with minerals  1 tablet Oral Daily   oxcarbazepine   600 mg Oral BID   rosuvastatin   20 mg Oral Daily   thiamine   50 mg Oral Daily   [START ON 01/04/2024] Vitamin D  (Ergocalciferol )  50,000 Units Oral Weekly   Continuous Infusions:  heparin  1,100 Units/hr (12/29/23 0614)   PRN Meds: acetaminophen , nitroGLYCERIN , ondansetron  (ZOFRAN ) IV   Vital Signs    Vitals:   12/29/23 0204 12/29/23 0214 12/29/23 0413 12/29/23 0801  BP: 125/70 115/62 131/65 131/74  Pulse:   75 67  Resp:   14 15  Temp:   97.8 F (36.6 C) 98.1 F (36.7 C)  TempSrc:   Oral Oral  SpO2:    98%  Weight:      Height:        Intake/Output Summary (Last 24 hours) at 12/29/2023 0826 Last data filed at 12/29/2023 0300 Gross per 24 hour  Intake 117.21 ml  Output --  Net 117.21 ml      12/29/2023    1:47 AM 12/26/2023    9:39 AM 12/20/2023    3:00 PM  Last 3 Weights  Weight (lbs) 217 lb 6.4 oz 219 lb 3.2 oz 216 lb  Weight (kg) 98.612 kg 99.428 kg 97.977 kg      Telemetry    Sinus rhythm with HR 60s - Personally Reviewed  ECG    NSR HR 78, minimal inferior ST depression - Personally Reviewed  Physical Exam   GEN: No acute distress.   Neck: No JVD Cardiac: RRR, no murmurs, rubs, or gallops.  Respiratory: Clear to auscultation  bilaterally. GI: Soft, nontender, non-distended  MS: No edema; No deformity. Neuro:  Nonfocal  Psych: Normal affect   Labs    High Sensitivity Troponin:   Recent Labs  Lab 12/28/23 1340 12/28/23 1515  TROPONINIHS 6 6     Chemistry Recent Labs  Lab 12/26/23 1054 12/28/23 1340 12/29/23 0420  NA 136 135 136  K 5.2 4.2 3.7  CL 100 102 102  CO2 21 26 25   GLUCOSE 85 87 90  BUN 10 14 10   CREATININE 0.71 0.56 0.54  CALCIUM  8.9 8.9 8.2*  MG  --   --  2.0  PROT 6.9  --  6.4*  ALBUMIN 4.3  --  3.4*  AST 27  --  20  ALT 30  --  29  ALKPHOS 88  --  64  BILITOT 0.3  --  0.3  GFRNONAA  --  >60 >60  ANIONGAP  --  7 9    Lipids  Recent Labs  Lab 12/26/23 1054  CHOL 165  TRIG 87  HDL 53  LABVLDL 16  LDLCALC 96  CHOLHDL 3.1    Hematology Recent Labs  Lab 12/28/23 1340 12/29/23 0420  WBC 9.8 10.6*  RBC 4.73 4.32  HGB 14.1 13.3  HCT 43.1 38.5  MCV 91.1 89.1  MCH 29.8 30.8  MCHC 32.7 34.5  RDW 14.2 14.1  PLT 257 245   Thyroid  No results for input(s): TSH, FREET4 in the last 168 hours.  BNP Recent Labs  Lab 12/29/23 0420  BNP 12.4    DDimer  Recent Labs  Lab 12/28/23 1340  DDIMER 0.37     Radiology    DG Chest 2 View Result Date: 12/28/2023 CLINICAL DATA:  Chest pain. Chest tightness. Shortness of breath pain when taking a deep breath. Started last night. EXAM: CHEST - 2 VIEW COMPARISON:  AP chest 07/09/2023, 12/31/2007 FINDINGS: Cardiac silhouette and mediastinal contours are within normal limits. The lungs are clear. Minimal blunting of the right costophrenic angle is unchanged from prior, likely chronic scarring. No pleural effusion or pneumothorax. Mild dextrocurvature of the mid to upper thoracic spine, unchanged. ACDF hardware overlies the lower cervical spine. Right upper quadrant abdominal cholecystectomy clips. IMPRESSION: No active cardiopulmonary disease. Electronically Signed   By: Tanda Lyons M.D.   On: 12/28/2023 14:49    Cardiac  Studies   Echo pending  LHC today  Patient Profile     53 y.o. female with PMH of HTN, CAC, elevated LPA, DM2, fibromyalgia, obesity, and depression who is admitted with unstable angina.   Assessment & Plan    Unstable angina - she reports ongoing chest pressure that is waxing and waning  that is responsive to nitroglycerin  - she does report worsening chest pain with deep breathing, has not been ill with positive RSV - continue ASA, heparin , BB -She is agreeable to heart catheterization today   Hyperlipidemia with LDL goal < 70 07/13/2023: VLDL 39.0 12/26/2023: Cholesterol, Total 165; HDL 53; LDL Chol Calc (NIH) 96; Triglycerides 87 LPA 165 Started on 20 mg crestor  - will attempt to titrate up, will be cautious with multiple allergies and fibromyalgia history -Suspect she will need a PCSK9 I   Hypertension -Continue 5 mg amlodipine , 6.25 mg carvedilol  twice daily -BP better controlled today -Allergies to lisinopril -HCTZ and olmesartan , she has a history of hyponatremia   RSV positive She denies recent illness   Fibromyalgia Not any current flare      For questions or updates, please contact [facility] Please consult [email or web address] for contact info under        Signed, Jon Nat Hails, PA  12/29/2023, 8:26 AM    Personally seen and examined. Agree with above.  53 year old with unstable angina.  Currently on IV heparin .  Proceeding with left heart catheterization.  Risk and benefits of been explained.  Willing to proceed.  RSV positive.  Doing well. Medications reviewed as above including Crestor  20 mg, carvedilol  6.2 mg twice a day amlodipine  5 mg.  Creatinine 0.54, troponin 6, LDL 96 echo pending, Coronary calcium  score 61 94th percentile.  LP(a) elevated 165.  Oneil Parchment, MD

## 2023-12-29 NOTE — H&P (Addendum)
 Cardiology Admission History and Physical   Patient ID: Gloria [name] MRN: [id number]; DOB: 1971-06-19   Admission date: 12/28/2023  PCP:  Mercer Clotilda JONELLE, MD   [facility] Providers Cardiologist:  Debby Sor, MD        Chief Complaint: Chest pain  Patient Profile:   Gloria [name] is a 53 y.o. female with a PMH of HTN, CAC, elevated LPA, DM2, fibromyalgia, obesity, and depression who is being seen 12/29/2023 for the evaluation of chest pain.  History of Present Illness:   Gloria [name] reports being in her usual state of health until 2 days ago she developed chest pressure at rest.  The chest pressure was persistent and moderate in intensity.  It was located across her chest but did not radiate.  She had associated SOB but no PND, orthopnea, swelling, syncope, nausea, vomiting, or diaphoresis.  She feels like anxiety makes the chest pain worse.  Despite her symptoms she proceeded to go to work that day but her chest pain continued prompted her to present to the ED by [facility].  Of note the patient has no family history of heart disease.  No active tobacco, EtOH, or illicit drug use.  She lives in [place] alone.  In the ED her VS were afebrile, HR 68, BP 147/78, RR 18, satting 100 send on RA.  Workup with [facility] was unremarkable including negative troponins and CXR WNL.  ECG was negative for ischemic changes.  Given the persistence and severity of her chest pressure despite ruling out ACS, cardiology was consulted and recommended admission to [facility] for LHC.  Currently the patient is chest pain-free and has no complaints.   Past Medical History:  Diagnosis Date   Alcohol abuse    Allergy    Anemia    hx of- prior to hysterectomy   Anxiety    Arthritis    Bilateral swelling of feet    Bulging disc    X 2   Chronic active hepatitis (HCC)    Chronic back pain 02/25/2015   Chronic fatigue syndrome    Chronic headaches    Chronic kidney disease    partial  kidney- from birth   DDD (degenerative disc disease), lumbar    Depression    Diabetes (HCC)    Diverticulosis    Drug use    Elevated LFTs    Fibromyalgia    GERD (gastroesophageal reflux disease)    History of ETOH abuse    Hyperlipidemia    Hyperplastic colon polyp    Hyponatremia    in the past   Internal hemorrhoids    Migraines    NAFLD (nonalcoholic fatty liver disease)    Obese    Obstructive sleep apnea 02/25/2015   not diagnosed per pt 04-03-20   Osteoarthritis    lower back- DDD   Periodic limb movement disorder 02/25/2014   Pre-diabetes    pt taking metformin    RLS (restless legs syndrome)    Sleep apnea    Smoker    Snoring    Substance abuse (HCC)    Swallowing difficulty    Vitamin B12 deficiency    Vitamin D  deficiency     Past Surgical History:  Procedure Laterality Date   CERVICAL FUSION     COLONOSCOPY     CYSTOSCOPY  06/06/2013   Procedure: CYSTOSCOPY;  Surgeon: Curlee VEAR Guan, MD;  Location: [facility];  Service: Gynecology;;   EXCISION METACARPAL MASS Right 09/03/2021   Procedure: EXCISION  METACARPAL MASS RIGHT SMALL FINGER;  Surgeon: Murrell Kuba, MD;  Location: [facility];  Service: Orthopedics;  Laterality: Right;   INTRAUTERINE DEVICE INSERTION  08/09/2008   PARAGUARD- removed   kidney surgery  1995   abcess   LAPAROSCOPY N/A 06/06/2013   Procedure: LAPAROSCOPY DIAGNOSTIC;  Surgeon: Curlee VEAR Guan, MD;  Location: [facility];  Service: Gynecology;  Laterality: N/A;   LAPAROTOMY  06/06/2013   Procedure: EXPLORATORY LAPAROTOMY;  Surgeon: Curlee VEAR Guan, MD;  Location: [facility];  Service: Gynecology;;   LIVER BIOPSY     partial right kideny  Right    radial tunnel Bilateral 2005   VAGINAL HYSTERECTOMY Left 06/06/2013   Procedure: Vaginal Hysterectomy with Patiial Right Salpingectomy;  Surgeon: Curlee VEAR Guan, MD;  Location: [facility];  Service: Gynecology;  Laterality: Left;     Medications Prior to Admission: Prior to Admission  medications   Medication Sig Start Date End Date Taking? Authorizing Provider  albuterol  (VENTOLIN  HFA) 108 (90 Base) MCG/ACT inhaler Inhale 2 puffs into the lungs every 4 (four) hours as needed for wheezing or shortness of breath. 03/02/23   [provider]  amLODipine  (NORVASC ) 5 MG tablet Take 1 tablet (5 mg total) by mouth daily. 12/05/23   Johnny Garnette LABOR, MD  azelastine  (ASTELIN ) 0.1 % nasal spray Place 2 sprays into both nostrils 2 (two) times daily. 09/10/22   Parker, Caleb M, MD  B Complex Vitamins (B COMPLEX PO) Take 1 tablet by mouth daily.    [provider]  blood glucose meter kit and supplies KIT Dispense based on patient and insurance preference. Use up to four times daily as directed. (FOR ICD-9 250.00, 250.01). 04/11/19   Mercer Clotilda SAUNDERS, MD  Blood Glucose Monitoring Suppl (ONETOUCH VERIO FLEX SYSTEM) w/Device KIT Use to check Blood sugars twice daily before meals 04/12/19   Mercer Clotilda SAUNDERS, MD  Brexpiprazole  (REXULTI ) 4 MG TABS Take 4 mg by mouth daily.    [provider]  Calcium  Carbonate-Vitamin D  600-400 MG-UNIT tablet Take 1 tablet by mouth daily.    [provider]  carvedilol  (COREG ) 6.25 MG tablet TAKE 1 TABLET(6.25 MG) BY MOUTH TWICE DAILY WITH A MEAL 12/02/23   Mercer Clotilda SAUNDERS, MD  cetirizine (ZYRTEC) 10 MG tablet Take 10 mg by mouth daily.    [provider]  Coenzyme Q10 (COQ10) 100 MG CAPS Take 100 mg by mouth daily.    [provider]  D3-50 1.25 MG (50000 UT) capsule Take 50,000 Units by mouth once a week. 06/08/23   [provider]  estradiol  (VIVELLE -DOT) 0.05 MG/24HR patch Place 1 patch (0.05 mg total) onto the skin 2 (two) times a week. 12/12/23   Glennon Almarie POUR, MD  fluticasone  (CUTIVATE ) 0.05 % cream Apply 1 Application topically 2 (two) times daily as needed (Behind ear/Behind belly flap).    [provider]  fluticasone  (FLONASE ) 50 MCG/ACT nasal spray Place 2 sprays into both nostrils  daily. 10/03/23   [provider]  glucose blood (CONTOUR NEXT TEST) test strip Use to test blood glucose twice daily 01/11/20   Mercer Clotilda SAUNDERS, MD  hydrOXYzine  (ATARAX ) 10 MG tablet Take 20 mg by mouth daily as needed for anxiety. 11/23/22   [provider]  ibuprofen  (ADVIL ) 800 MG tablet TAKE 1 TABLET(800 MG) BY MOUTH EVERY 8 HOURS AS NEEDED FOR MODERATE PAIN 03/25/21   Mercer Clotilda SAUNDERS, MD  Lancets [facility] DELICA PLUS LANCET30G) MISC USE 1  TO  CHECK GLUCOSE 4 TIMES DAILY 10/11/19   Mercer Clotilda SAUNDERS, MD  Magnesium  500 MG CAPS Take 500 mg by mouth at bedtime.    [provider]  Multiple Vitamins-Minerals (MULTIVITAMIN PO) Take 1 tablet by mouth daily.     [provider]  OMEGA 3-6-9 FATTY ACIDS PO Take 500 mg by mouth daily.    [provider]  oxcarbazepine  (TRILEPTAL ) 600 MG tablet Take 600 mg by mouth 2 (two) times daily. 10/22/21   [provider]  rizatriptan  (MAXALT ) 10 MG tablet TAKE 1 TABLET BY MOUTH AS NEEDED FOR MIGRAINE 03/03/23   Mercer Clotilda SAUNDERS, MD  rosuvastatin  (CRESTOR ) 20 MG tablet Take 1 tablet (20 mg total) by mouth daily. 09/26/23 12/25/23  Burnard Debby LABOR, MD  thiamine  (VITAMIN B-1) 50 MG tablet Take 50 mg by mouth daily.    [provider]  tirzepatide  (MOUNJARO ) 15 MG/0.5ML Pen Inject 15 mg into the skin once a week. 12/20/23   Verdon Parry D, MD  vitamin C  (ASCORBIC ACID ) 500 MG tablet Take 500 mg by mouth daily.    [provider]  VITAMIN E PO Take 900 mg by mouth daily.    [provider]     Allergies:    Allergies  Allergen Reactions   Benadryl  [Diphenhydramine  Hcl] Hives   Ropinirole  Other (See Comments)   Levofloxacin Other (See Comments)   Aspirin     Codeine    Doxycycline    Lisinopril -Hydrochlorothiazide  Other (See Comments)    Dry cough, dry eyes, insomnia, emotionally liable, increased thirst, brain fog, fatigue.   Metformin  And Related     Blurred vision   Nuvigil   [Armodafinil ]     migraines    Olmesartan  Other (See Comments)    May have contributed to hyponatremia.  Baseline sodium 131-133.   Requip  [Ropinirole  Hcl]     Increased headache   Semaglutide  Other (See Comments)    Rybelsus  caused depression and fibromyalgia flair.   Tramadol      Other reaction(s): Other (See Comments)   Wellbutrin [Bupropion]    Zolpidem      Social History:   Social History   Socioeconomic History   Marital status: Divorced    Spouse name: Not on file   Number of children: 1   Years of education: 13   Highest education level: Associate degree: academic program  Occupational History   Occupation: ACCOUNTING   Occupation: Armed Forces Operational Officer  Tobacco Use   Smoking status: Former    Current packs/day: 0.00    Types: Cigarettes    Quit date: 01/26/2012    Years since quitting: 11.9   Smokeless tobacco: Never  Vaping Use   Vaping status: Never Used  Substance and Sexual Activity   Alcohol use: No    Alcohol/week: 0.0 standard drinks of alcohol    Comment: History of alcohol abuse   Drug use: No   Sexual activity: Yes    Partners: Male    Birth control/protection: Surgical    Comment: hysterectomy  Other Topics Concern   Not on file  Social History Narrative   Patient lives at home and works full time.   Education some college.   Right handed.   Caffeine: 2 cups daily.   Social Drivers of Corporate Investment Banker Strain: Low Risk  (12/29/2023)   Overall Financial Resource Strain (CARDIA)    Difficulty of Paying Living Expenses: Not hard at all  Food Insecurity: No Food Insecurity (12/29/2023)   Hunger Vital Sign  Worried About Programme Researcher, Broadcasting/film/video in the Last Year: Never true    Ran Out of Food in the Last Year: Never true  Transportation Needs: No Transportation Needs (12/29/2023)   PRAPARE - Administrator, Civil Service (Medical): No    Lack of Transportation (Non-Medical): No  Physical Activity: Insufficiently Active (12/29/2023)    Exercise Vital Sign    Days of Exercise per Week: 2 days    Minutes of Exercise per Session: 30 min  Stress: No Stress Concern Present (12/29/2023)   [employer] of Occupational Health - Occupational Stress Questionnaire    Feeling of Stress : Only a little  Social Connections: Moderately Integrated (12/29/2023)   Social Connection and Isolation Panel [NHANES]    Frequency of Communication with Friends and Family: More than three times a week    Frequency of Social Gatherings with Friends and Family: More than three times a week    Attends Religious Services: More than 4 times per year    Active Member of [employer] or Organizations: Yes    Attends Engineer, Structural: More than 4 times per year    Marital Status: Divorced  Intimate Partner Violence: Not At Risk (12/29/2023)   Humiliation, Afraid, Rape, and Kick questionnaire    Fear of Current or Ex-Partner: No    Emotionally Abused: No    Physically Abused: No    Sexually Abused: No    Family History:   The patient's family history includes Alcohol abuse in her father; Alcoholism in her father, maternal aunt, maternal grandfather, maternal grandmother, and paternal grandfather; Anxiety disorder in her father and mother; Breast cancer in her maternal aunt, mother, and paternal grandmother; Cancer in her father and mother; Cirrhosis in her father; Colon cancer in her paternal grandmother; Colon polyps in her father; Depression in her father and mother; Diabetes in her father; Esophageal cancer in her paternal grandfather; Hyperlipidemia in her father; Irritable bowel syndrome in her sister; Liver cancer in her father; Liver disease in her father; Obesity in her father and mother; Sleep apnea in her brother, father, and mother; Stomach cancer in her paternal grandfather. There is no history of Rectal cancer.    ROS:  Please see the history of present illness.  All other ROS reviewed and negative.     Physical Exam/Data:   Vitals:    12/28/23 2300 12/28/23 2315 12/29/23 0005 12/29/23 0147  BP: 139/68 138/65    Pulse: 71 69    Resp: 13 13    Temp:   97.7 F (36.5 C)   TempSrc:   Temporal   SpO2: 97% 98%    Weight:    98.6 kg  Height:    5' 4 (1.626 m)   No intake or output data in the 24 hours ending 12/29/23 0200    12/29/2023    1:47 AM 12/26/2023    9:39 AM 12/20/2023    3:00 PM  Last 3 Weights  Weight (lbs) 217 lb 6.4 oz 219 lb 3.2 oz 216 lb  Weight (kg) 98.612 kg 99.428 kg 97.977 kg     Body mass index is 37.32 kg/m.  General:  Well nourished, well developed, in no acute distress HEENT: normal Neck: no JVD Vascular: No carotid bruits; Distal pulses 2+ bilaterally   Cardiac:  normal S1, S2; RRR; no murmur  Lungs:  clear to auscultation bilaterally, no wheezing, rhonchi or rales  Abd: soft, nontender, no hepatomegaly  Ext: no edema Musculoskeletal:  No  deformities, BUE and BLE strength normal and equal Skin: warm and dry  Neuro:  CNs 2-12 intact, no focal abnormalities noted Psych:  Normal affect    EKG:  The ECG that was done  was personally reviewed and demonstrates NSR    Relevant CV Studies:   TTE 09/27/23:  IMPRESSIONS     1. Left ventricular ejection fraction, by estimation, is 60 to 65%. The  left ventricle has normal function. The left ventricle has no regional  wall motion abnormalities. The left ventricular internal cavity size was  mildly dilated. Left ventricular  diastolic parameters were normal. The average left ventricular global  longitudinal strain is -20.1 %. The global longitudinal strain is normal.   2. Right ventricular systolic function is normal. The right ventricular  size is normal. Tricuspid regurgitation signal is inadequate for assessing  PA pressure.   3. The mitral valve is normal in structure. Trivial mitral valve  regurgitation. No evidence of mitral stenosis.   4. The aortic valve is normal in structure. Aortic valve regurgitation is  not visualized. No  aortic stenosis is present.   5. Aortic dilatation noted. There is mild dilatation of the ascending  aorta, measuring 38 mm.   6. The inferior vena cava is normal in size with greater than 50%  respiratory variability, suggesting right atrial pressure of 3 mmHg.    Laboratory Data:  High Sensitivity Troponin:   Recent Labs  Lab 12/28/23 1340 12/28/23 1515  TROPONINIHS 6 6      Chemistry Recent Labs  Lab 12/26/23 1054 12/28/23 1340  NA 136 135  K 5.2 4.2  CL 100 102  CO2 21 26  GLUCOSE 85 87  BUN 10 14  CREATININE 0.71 0.56  CALCIUM  8.9 8.9  GFRNONAA  --  >60  ANIONGAP  --  7    Recent Labs  Lab 12/26/23 1054  PROT 6.9  ALBUMIN 4.3  AST 27  ALT 30  ALKPHOS 88  BILITOT 0.3   Lipids  Recent Labs  Lab 12/26/23 1054  CHOL 165  TRIG 87  HDL 53  LABVLDL 16  LDLCALC 96  CHOLHDL 3.1   Hematology Recent Labs  Lab 12/28/23 1340  WBC 9.8  RBC 4.73  HGB 14.1  HCT 43.1  MCV 91.1  MCH 29.8  MCHC 32.7  RDW 14.2  PLT 257   Thyroid  No results for input(s): TSH, FREET4 in the last 168 hours. BNPNo results for input(s): BNP, PROBNP in the last 168 hours.  DDimer  Recent Labs  Lab 12/28/23 1340  DDIMER 0.37     Radiology/Studies:  DG Chest 2 View Result Date: 12/28/2023 CLINICAL DATA:  Chest pain. Chest tightness. Shortness of breath pain when taking a deep breath. Started last night. EXAM: CHEST - 2 VIEW COMPARISON:  AP chest 07/09/2023, 12/31/2007 FINDINGS: Cardiac silhouette and mediastinal contours are within normal limits. The lungs are clear. Minimal blunting of the right costophrenic angle is unchanged from prior, likely chronic scarring. No pleural effusion or pneumothorax. Mild dextrocurvature of the mid to upper thoracic spine, unchanged. ACDF hardware overlies the lower cervical spine. Right upper quadrant abdominal cholecystectomy clips. IMPRESSION: No active cardiopulmonary disease. Electronically Signed   By: Tanda Lyons M.D.   On:  12/28/2023 14:49     Assessment and Plan:   Gloria [name] is a 53 y.o. female with a PMH of HTN, CAC, elevated LPA, DM2, fibromyalgia, obesity, and depression who is being seen 12/29/2023 for the evaluation of  chest pain.  #Unstable Angina :: Presented with chest pain at rest with some atypical features; however, she has high risk for developing obstructive CAD.  I agree with the admission and performing an LHC in the morning.  Will also obtain an echo and continue heparin . -Initially the chart noted that she has an aspirin  allergy, but I spoke with the patient and she denies ever having had an aspirin  or NSAID allergy. -Load aspirin  -Continue heparin  -N.p.o. for likely LHC -Complete echo -As needed SLNGT  #DM2 -Hold home tirzepatide  -SSI prn  #HTN #HLD -Continue amlodipine  5 Mg -Continue Coreg  6.25 mg twice daily -Continue Crestor  20  #RSV + ::No symptoms. Maintain precautions but NTD.   Risk Assessment/Risk Scores:    TIMI Risk Score for Unstable Angina or Non-ST Elevation MI:   The patient's TIMI risk score is 1, which indicates a 5% risk of all cause mortality, new or recurrent myocardial infarction or need for urgent revascularization in the next 14 days.      Code Status: Full Code  Severity of Illness: The appropriate patient status for this patient is INPATIENT. Inpatient status is judged to be reasonable and necessary in order to provide the required intensity of service to ensure the patient's safety. The patient's presenting symptoms, physical exam findings, and initial radiographic and laboratory data in the context of their chronic comorbidities is felt to place them at high risk for further clinical deterioration. Furthermore, it is not anticipated that the patient will be medically stable for discharge from the hospital within 2 midnights of admission.   * I certify that at the point of admission it is my clinical judgment that the patient will require  inpatient hospital care spanning beyond 2 midnights from the point of admission due to high intensity of service, high risk for further deterioration and high frequency of surveillance required.*   For questions or updates, please contact [facility] Please consult [email or web address] for contact info under     Signed, Georganna Archer, MD  12/29/2023 2:00 AM

## 2023-12-29 NOTE — Interval H&P Note (Signed)
 History and Physical Interval Note:  12/29/2023 1:58 PM  Gloria [name]  has presented today for surgery, with the diagnosis of Unstable Angina.  The various methods of treatment have been discussed with the patient and family. After consideration of risks, benefits and other options for treatment, the patient has consented to  Procedure(s): LEFT HEART CATH AND CORONARY ANGIOGRAPHY (N/A) as a surgical intervention.  The patient's history has been reviewed, patient examined, no change in status, stable for surgery.  I have reviewed the patient's chart and labs.  Questions were answered to the patient's satisfaction.    Cath Lab Visit (complete for each Cath Lab visit)  Clinical Evaluation Leading to the Procedure:   ACS: Yes.    Non-ACS:    Anginal Classification: CCS III  Anti-ischemic medical therapy: Minimal Therapy (1 class of medications)  Non-Invasive Test Results: No non-invasive testing performed  Prior CABG: No previous CABG       [facility] 12/29/2023 1:58 PM

## 2023-12-29 NOTE — Plan of Care (Signed)
  Problem: Education: Goal: Knowledge of General Education information will improve Description: Including pain rating scale, medication(s)/side effects and non-pharmacologic comfort measures Outcome: Progressing   Problem: Health Behavior/Discharge Planning: Goal: Ability to manage health-related needs will improve Outcome: Progressing   Problem: Clinical Measurements: Goal: Ability to maintain clinical measurements within normal limits will improve Outcome: Progressing Goal: Will remain free from infection Outcome: Progressing Goal: Diagnostic test results will improve Outcome: Progressing Goal: Respiratory complications will improve Outcome: Progressing Goal: Cardiovascular complication will be avoided Outcome: Progressing   Problem: Activity: Goal: Risk for activity intolerance will decrease Outcome: Progressing   Problem: Nutrition: Goal: Adequate nutrition will be maintained Outcome: Progressing   Problem: Coping: Goal: Level of anxiety will decrease Outcome: Progressing   Problem: Elimination: Goal: Will not experience complications related to bowel motility Outcome: Progressing Goal: Will not experience complications related to urinary retention Outcome: Progressing   Problem: Pain Managment: Goal: General experience of comfort will improve and/or be controlled Outcome: Progressing   Problem: Safety: Goal: Ability to remain free from injury will improve Outcome: Progressing   Problem: Skin Integrity: Goal: Risk for impaired skin integrity will decrease Outcome: Progressing   Problem: Education: Goal: Understanding of cardiac disease, CV risk reduction, and recovery process will improve Outcome: Progressing Goal: Individualized Educational Video(s) Outcome: Progressing   Problem: Activity: Goal: Ability to tolerate increased activity will improve Outcome: Progressing   Problem: Cardiac: Goal: Ability to achieve and maintain adequate cardiovascular  perfusion will improve Outcome: Progressing   Problem: Health Behavior/Discharge Planning: Goal: Ability to safely manage health-related needs after discharge will improve Outcome: Progressing   Problem: Education: Goal: Ability to describe self-care measures that may prevent or decrease complications (Diabetes Survival Skills Education) will improve Outcome: Progressing Goal: Individualized Educational Video(s) Outcome: Progressing   Problem: Coping: Goal: Ability to adjust to condition or change in health will improve Outcome: Progressing   Problem: Fluid Volume: Goal: Ability to maintain a balanced intake and output will improve Outcome: Progressing   Problem: Health Behavior/Discharge Planning: Goal: Ability to identify and utilize available resources and services will improve Outcome: Progressing Goal: Ability to manage health-related needs will improve Outcome: Progressing   Problem: Metabolic: Goal: Ability to maintain appropriate glucose levels will improve Outcome: Progressing   Problem: Nutritional: Goal: Maintenance of adequate nutrition will improve Outcome: Progressing Goal: Progress toward achieving an optimal weight will improve Outcome: Progressing   Problem: Skin Integrity: Goal: Risk for impaired skin integrity will decrease Outcome: Progressing   Problem: Tissue Perfusion: Goal: Adequacy of tissue perfusion will improve Outcome: Progressing

## 2023-12-29 NOTE — Progress Notes (Addendum)
 Rounding Note    Patient Name: Gloria [name] Date of Encounter: 12/29/2023  [facility] Cardiologist: Debby Sor, MD --> changing to Dr. Jeffrie  Subjective   Waxing and waning chest pain responsive to NTG. She agrees to cath.  Inpatient Medications    Scheduled Meds:  amLODipine   5 mg Oral Daily   ascorbic acid   500 mg Oral Daily   azelastine   2 spray Each Nare BID   B-complex with vitamin C   1 tablet Oral Daily   brexpiprazole   4 mg Oral Daily   carvedilol   6.25 mg Oral BID WC   fluticasone   2 spray Each Nare Daily   insulin  aspart  0-4 Units Subcutaneous TID WC   loratadine   10 mg Oral Daily   magnesium  oxide  400 mg Oral QHS   multivitamin with minerals  1 tablet Oral Daily   oxcarbazepine   600 mg Oral BID   rosuvastatin   20 mg Oral Daily   thiamine   50 mg Oral Daily   [START ON 01/04/2024] Vitamin D  (Ergocalciferol )  50,000 Units Oral Weekly   Continuous Infusions:  heparin  1,100 Units/hr (12/29/23 0614)   PRN Meds: acetaminophen , nitroGLYCERIN , ondansetron  (ZOFRAN ) IV   Vital Signs    Vitals:   12/29/23 0204 12/29/23 0214 12/29/23 0413 12/29/23 0801  BP: 125/70 115/62 131/65 131/74  Pulse:   75 67  Resp:   14 15  Temp:   97.8 F (36.6 C) 98.1 F (36.7 C)  TempSrc:   Oral Oral  SpO2:    98%  Weight:      Height:        Intake/Output Summary (Last 24 hours) at 12/29/2023 0826 Last data filed at 12/29/2023 0300 Gross per 24 hour  Intake 117.21 ml  Output --  Net 117.21 ml      12/29/2023    1:47 AM 12/26/2023    9:39 AM 12/20/2023    3:00 PM  Last 3 Weights  Weight (lbs) 217 lb 6.4 oz 219 lb 3.2 oz 216 lb  Weight (kg) 98.612 kg 99.428 kg 97.977 kg      Telemetry    Sinus rhythm with HR 60s - Personally Reviewed  ECG    NSR HR 78, minimal inferior ST depression - Personally Reviewed  Physical Exam   GEN: No acute distress.   Neck: No JVD Cardiac: RRR, no murmurs, rubs, or gallops.  Respiratory: Clear to auscultation  bilaterally. GI: Soft, nontender, non-distended  MS: No edema; No deformity. Neuro:  Nonfocal  Psych: Normal affect   Labs    High Sensitivity Troponin:   Recent Labs  Lab 12/28/23 1340 12/28/23 1515  TROPONINIHS 6 6     Chemistry Recent Labs  Lab 12/26/23 1054 12/28/23 1340 12/29/23 0420  NA 136 135 136  K 5.2 4.2 3.7  CL 100 102 102  CO2 21 26 25   GLUCOSE 85 87 90  BUN 10 14 10   CREATININE 0.71 0.56 0.54  CALCIUM  8.9 8.9 8.2*  MG  --   --  2.0  PROT 6.9  --  6.4*  ALBUMIN 4.3  --  3.4*  AST 27  --  20  ALT 30  --  29  ALKPHOS 88  --  64  BILITOT 0.3  --  0.3  GFRNONAA  --  >60 >60  ANIONGAP  --  7 9    Lipids  Recent Labs  Lab 12/26/23 1054  CHOL 165  TRIG 87  HDL 53  LABVLDL 16  LDLCALC 96  CHOLHDL 3.1    Hematology Recent Labs  Lab 12/28/23 1340 12/29/23 0420  WBC 9.8 10.6*  RBC 4.73 4.32  HGB 14.1 13.3  HCT 43.1 38.5  MCV 91.1 89.1  MCH 29.8 30.8  MCHC 32.7 34.5  RDW 14.2 14.1  PLT 257 245   Thyroid  No results for input(s): TSH, FREET4 in the last 168 hours.  BNP Recent Labs  Lab 12/29/23 0420  BNP 12.4    DDimer  Recent Labs  Lab 12/28/23 1340  DDIMER 0.37     Radiology    DG Chest 2 View Result Date: 12/28/2023 CLINICAL DATA:  Chest pain. Chest tightness. Shortness of breath pain when taking a deep breath. Started last night. EXAM: CHEST - 2 VIEW COMPARISON:  AP chest 07/09/2023, 12/31/2007 FINDINGS: Cardiac silhouette and mediastinal contours are within normal limits. The lungs are clear. Minimal blunting of the right costophrenic angle is unchanged from prior, likely chronic scarring. No pleural effusion or pneumothorax. Mild dextrocurvature of the mid to upper thoracic spine, unchanged. ACDF hardware overlies the lower cervical spine. Right upper quadrant abdominal cholecystectomy clips. IMPRESSION: No active cardiopulmonary disease. Electronically Signed   By: Tanda Lyons M.D.   On: 12/28/2023 14:49    Cardiac  Studies   Echo pending  LHC today  Patient Profile     53 y.o. female with PMH of HTN, CAC, elevated LPA, DM2, fibromyalgia, obesity, and depression who is admitted with unstable angina.   Assessment & Plan    Unstable angina - she reports ongoing chest pressure that is waxing and waning  that is responsive to nitroglycerin  - she does report worsening chest pain with deep breathing, has not been ill with positive RSV - continue ASA, heparin , BB -She is agreeable to heart catheterization today   Hyperlipidemia with LDL goal < 70 07/13/2023: VLDL 39.0 12/26/2023: Cholesterol, Total 165; HDL 53; LDL Chol Calc (NIH) 96; Triglycerides 87 LPA 165 Started on 20 mg crestor  - will attempt to titrate up, will be cautious with multiple allergies and fibromyalgia history -Suspect she will need a PCSK9 I   Hypertension -Continue 5 mg amlodipine , 6.25 mg carvedilol  twice daily -BP better controlled today -Allergies to lisinopril -HCTZ and olmesartan , she has a history of hyponatremia   RSV positive She denies recent illness   Fibromyalgia Not any current flare      For questions or updates, please contact [facility] Please consult [email or web address] for contact info under        Signed, Jon Nat Hails, PA  12/29/2023, 8:26 AM    Personally seen and examined. Agree with above.  53 year old with unstable angina.  Currently on IV heparin .  Proceeding with left heart catheterization.  Risk and benefits of been explained.  Willing to proceed.  RSV positive.  Doing well. Medications reviewed as above including Crestor  20 mg, carvedilol  6.2 mg twice a day amlodipine  5 mg.  Creatinine 0.54, troponin 6, LDL 96 echo pending, Coronary calcium  score 61 94th percentile.  LP(a) elevated 165.  Oneil Parchment, MD

## 2023-12-29 NOTE — ED Notes (Signed)
 Called [facility] to transport patient to [facility] rm# 5

## 2023-12-29 NOTE — Progress Notes (Signed)
 ANTICOAGULATION CONSULT NOTE  Pharmacy Consult for Heparin  Indication: chest pain/ACS Brief A/P: Heparin  level subtherapeutic Increase Heparin  rate   Allergies  Allergen Reactions   Benadryl  [Diphenhydramine  Hcl] Hives   Ropinirole  Other (See Comments)   Levofloxacin Other (See Comments)   Codeine    Doxycycline    Lisinopril -Hydrochlorothiazide  Other (See Comments)    Dry cough, dry eyes, insomnia, emotionally liable, increased thirst, brain fog, fatigue.   Metformin  And Related     Blurred vision   Nuvigil  [Armodafinil ]     migraines    Olmesartan  Other (See Comments)    May have contributed to hyponatremia.  Baseline sodium 131-133.   Requip  [Ropinirole  Hcl]     Increased headache   Semaglutide  Other (See Comments)    Rybelsus  caused depression and fibromyalgia flair.   Tramadol      Other reaction(s): Other (See Comments)   Wellbutrin [Bupropion]    Zolpidem      Patient Measurements: Height: 5' 4 (162.6 cm) Weight: 98.6 kg (217 lb 6.4 oz) IBW/kg (Calculated) : 54.7 Heparin  Dosing Weight: 78.7 kg  Vital Signs: Temp: 97.8 F (36.6 C) (02/06 0413) Temp Source: Oral (02/06 0413) BP: 131/65 (02/06 0413) Pulse Rate: 75 (02/06 0413)  Labs: Recent Labs    12/26/23 1054 12/28/23 1340 12/28/23 1515 12/29/23 0420  HGB  --  14.1  --  13.3  HCT  --  43.1  --  38.5  PLT  --  257  --  245  HEPARINUNFRC  --   --   --  0.29*  CREATININE 0.71 0.56  --   --   TROPONINIHS  --  6 6  --     Estimated Creatinine Clearance: 92.8 mL/min (by C-G formula based on SCr of 0.56 mg/dL).  Assessment: 53 y.o. female with chest pain for heparin    Goal of Therapy:  Heparin  level 0.3-0.7 units/ml Monitor platelets by anticoagulation protocol: Yes   Plan:  Increase Heparin  1100 units/hr Follow up after cath today    Gloria [name], PharmD, BCPS

## 2023-12-29 NOTE — TOC Initial Note (Signed)
 Transition of Care [facility]) - Initial/Assessment Note    Patient Details  Name: Gloria [name] MRN: [id number] Date of Birth: November 02, 1971  Transition of [facility]) CM/SW Contact:    Gloria Glenys DASEN, RN Phone Number: 12/29/2023, 12:37 PM  Clinical Narrative:                 Presented for Unstable angina. PTA patient states she was independent and lives alone in a single family home. Patients states she drives to appointments and has a great support system. Gloria [name] (sponsor) present in patients room. No DME. Patient has insurance and PCP. No home needs identified during this visit. Transportation home will be Gloria [name] (friend) via private vehicle. Case manager will continue to follow the patient as she progresses.  Expected Discharge Plan: Home/Self Care Barriers to Discharge: No Barriers Identified   Patient Goals and CMS Choice Patient states their goals for this hospitalization and ongoing recovery are:: Return home          Expected Discharge Plan and Services     Post Acute Care Choice: NA Living arrangements for the past 2 months: Single Family Home                   DME Agency: NA                  Prior Living Arrangements/Services Living arrangements for the past 2 months: Single Family Home Lives with:: Self Patient language and need for interpreter reviewed:: Yes Do you feel safe going back to the place where you live?: Yes      Need for Family Participation in Patient Care: No (Comment) Care giver support system in place?: Yes (comment)   Criminal Activity/Legal Involvement Pertinent to Current Situation/Hospitalization: No - Comment as needed  Activities of Daily Living   ADL Screening (condition at time of admission) Independently performs ADLs?: Yes (appropriate for developmental age) Is the patient deaf or have difficulty hearing?: No Does the patient have difficulty seeing, even when wearing glasses/contacts?: No Does the patient have  difficulty concentrating, remembering, or making decisions?: No  Permission Sought/Granted Permission sought to share information with : Case Manager Permission granted to share information with : Yes, Verbal Permission Granted              Emotional Assessment Appearance:: Appears stated age Attitude/Demeanor/Rapport: Engaged Affect (typically observed): Appropriate, Calm Orientation: : Oriented to Self, Oriented to Place, Oriented to  Time, Oriented to Situation Alcohol / Substance Use: Not Applicable (!5 years sorber) Psych Involvement: No (comment)  Admission diagnosis:  Unstable angina (HCC) [I20.0] RSV (respiratory syncytial virus infection) [B33.8] Acute chest pain [R07.9] Patient Active Problem List   Diagnosis Date Noted   Unstable angina (HCC) 12/28/2023   Drug-induced weight loss 10/24/2023   Chronic kidney disease    Class 2 obesity due to excess calories with body mass index (BMI) of 38.0 to 38.9 in adult 07/19/2023   Hyponatremia 07/09/2023   BMI 38.0-38.9,adult 05/24/2023   Chronic pain of left ankle 02/26/2023   Other osteoarthritis of spine 01/04/2023   BMI 40.0-44.9, adult (HCC) 01/04/2023   Obesity, Beginning BMI 45.60 01/04/2023   Nasal turbinate hypertrophy 12/10/2022   Nasal vestibulitis 12/10/2022   Low back pain 10/07/2022   Pre-hypertension 08/05/2022   Mixed hyperlipidemia 07/14/2022   Stress 06/09/2022   Essential hypertension 05/03/2022   At risk for heart disease 05/03/2022   Vitamin D  deficiency 04/07/2022   At risk for hypoglycemia 04/07/2022  Acquired cavovarus deformity of right foot 07/08/2021   Rupture of peroneal tendon 07/08/2021   Pain of left hip joint 05/22/2021   Lumbar radiculopathy 03/16/2021   Non-restorative sleep 10/25/2020   Gasping for breath 09/23/2020   Class 3 severe obesity with serious comorbidity and body mass index (BMI) of 45.0 to 49.9 in adult [facility]) 09/23/2020   Recurrent hypersomnia 09/23/2020   MDD  (recurrent major depressive disorder) in remission (HCC) 09/23/2020   Sacroiliac joint pain 06/28/2020   Diabetes mellitus (HCC) 04/06/2019   Degeneration of lumbar intervertebral disc 05/31/2018   Nausea 06/03/2016   Prediabetes 05/13/2016   Migraine without status migrainosus, not intractable 03/30/2016   Bilateral lower extremity edema 03/03/2016   Allergic rhinitis 01/16/2016   Arthralgia 01/16/2016   Myalgia 01/16/2016   H/O metrorrhagia 01/16/2016   Disease of nasal cavity and sinuses 01/16/2016   Diarrhea 01/16/2016   Cafe au lait spots 01/16/2016   Constipation 01/16/2016   IBS (irritable bowel syndrome) 01/16/2016   Sacroiliac joint dysfunction 01/16/2016   OSA on CPAP 02/25/2015   Chronic back pain 02/25/2015   Periodic limb movement disorder 02/25/2014   Restless legs syndrome 12/15/2013   Cardiac murmur 10/16/2013   Snoring 10/16/2013   Hx of migraine headaches 10/16/2013   Fluid retention 10/12/2013   Weight gain 10/12/2013   Anxiety 10/12/2013   Alcoholic liver disease (HCC) 10/28/2012   Ex-cigarette smoker 10/28/2012   Fibromyalgia 10/27/2012   Depression with anxiety 10/27/2012   GERD (gastroesophageal reflux disease) 10/27/2012   H/O ETOH abuse 10/27/2012   PCP:  Gloria Clotilda SAUNDERS, MD Pharmacy:   [facility] [phone] [place], [place] - [address] AT [facility] [address] & [facility] [address] [place] [address] Phone: [phone] Fax: [phone]  [facility] #[id number] - [place], [place] - [address] AT [facility] OF [address] & [address] [place] [address] Phone: [phone] Fax: [phone]  [place] [facility] [address] [place] [address] Phone: [phone] Fax: [phone]  [facility] [place] [facility] [address] [place] [address] Phone: [phone] Fax: [phone]     Social Drivers of  Health (SDOH) Social History: SDOH Screenings   Food Insecurity: No Food Insecurity (12/29/2023)  Housing: Low Risk  (12/29/2023)  Transportation Needs: No Transportation Needs (12/29/2023)  Utilities: Not At Risk (12/29/2023)  Alcohol Screen: Low Risk  (12/29/2023)  Depression (PHQ2-9): Low Risk  (12/29/2023)  Financial Resource Strain: Low Risk  (12/29/2023)  Physical Activity: Insufficiently Active (12/29/2023)  Social Connections: Moderately Integrated (12/29/2023)  Stress: No Stress Concern Present (12/29/2023)  Tobacco Use: Medium Risk (12/28/2023)  Health Literacy: Adequate Health Literacy (12/29/2023)   SDOH Interventions:     Readmission Risk Interventions    12/29/2023   11:20 AM 07/20/2023   11:59 AM  Readmission Risk Prevention Plan  Transportation Screening Complete Complete  PCP or Specialist Appt within 5-7 Days Complete Complete  Home Care Screening Complete Complete  Medication Review (RN CM) Complete Complete

## 2023-12-29 NOTE — Plan of Care (Signed)
  Problem: Education: Goal: Knowledge of General Education information will improve Description: Including pain rating scale, medication(s)/side effects and non-pharmacologic comfort measures Outcome: Progressing   Problem: Health Behavior/Discharge Planning: Goal: Ability to manage health-related needs will improve Outcome: Progressing   Problem: Clinical Measurements: Goal: Ability to maintain clinical measurements within normal limits will improve Outcome: Progressing Goal: Will remain free from infection Outcome: Progressing Goal: Diagnostic test results will improve Outcome: Progressing Goal: Respiratory complications will improve Outcome: Progressing Goal: Cardiovascular complication will be avoided Outcome: Progressing   Problem: Activity: Goal: Risk for activity intolerance will decrease Outcome: Progressing   Problem: Nutrition: Goal: Adequate nutrition will be maintained Outcome: Progressing   Problem: Coping: Goal: Level of anxiety will decrease Outcome: Progressing   Problem: Elimination: Goal: Will not experience complications related to bowel motility Outcome: Progressing Goal: Will not experience complications related to urinary retention Outcome: Progressing   Problem: Pain Managment: Goal: General experience of comfort will improve and/or be controlled Outcome: Progressing   Problem: Safety: Goal: Ability to remain free from injury will improve Outcome: Progressing   Problem: Skin Integrity: Goal: Risk for impaired skin integrity will decrease Outcome: Progressing   Problem: Education: Goal: Understanding of cardiac disease, CV risk reduction, and recovery process will improve Outcome: Progressing Goal: Individualized Educational Video(s) Outcome: Progressing   Problem: Activity: Goal: Ability to tolerate increased activity will improve Outcome: Progressing   Problem: Cardiac: Goal: Ability to achieve and maintain adequate cardiovascular  perfusion will improve Outcome: Progressing   Problem: Health Behavior/Discharge Planning: Goal: Ability to safely manage health-related needs after discharge will improve Outcome: Progressing   Problem: Education: Goal: Ability to describe self-care measures that may prevent or decrease complications (Diabetes Survival Skills Education) will improve Outcome: Progressing Goal: Individualized Educational Video(s) Outcome: Progressing   Problem: Coping: Goal: Ability to adjust to condition or change in health will improve Outcome: Progressing   Problem: Fluid Volume: Goal: Ability to maintain a balanced intake and output will improve Outcome: Progressing   Problem: Health Behavior/Discharge Planning: Goal: Ability to identify and utilize available resources and services will improve Outcome: Progressing Goal: Ability to manage health-related needs will improve Outcome: Progressing   Problem: Metabolic: Goal: Ability to maintain appropriate glucose levels will improve Outcome: Progressing   Problem: Nutritional: Goal: Maintenance of adequate nutrition will improve Outcome: Progressing Goal: Progress toward achieving an optimal weight will improve Outcome: Progressing   Problem: Skin Integrity: Goal: Risk for impaired skin integrity will decrease Outcome: Progressing   Problem: Tissue Perfusion: Goal: Adequacy of tissue perfusion will improve Outcome: Progressing   Problem: Education: Goal: Understanding of CV disease, CV risk reduction, and recovery process will improve Outcome: Progressing Goal: Individualized Educational Video(s) Outcome: Progressing   Problem: Activity: Goal: Ability to return to baseline activity level will improve Outcome: Progressing   Problem: Cardiovascular: Goal: Ability to achieve and maintain adequate cardiovascular perfusion will improve Outcome: Progressing Goal: Vascular access site(s) Level 0-1 will be maintained Outcome:  Progressing   Problem: Health Behavior/Discharge Planning: Goal: Ability to safely manage health-related needs after discharge will improve Outcome: Progressing

## 2023-12-30 ENCOUNTER — Encounter (HOSPITAL_COMMUNITY): Payer: Self-pay | Admitting: Cardiology

## 2023-12-30 ENCOUNTER — Inpatient Hospital Stay (HOSPITAL_COMMUNITY): Payer: Managed Care, Other (non HMO)

## 2023-12-30 ENCOUNTER — Other Ambulatory Visit (HOSPITAL_COMMUNITY): Payer: Self-pay

## 2023-12-30 DIAGNOSIS — I200 Unstable angina: Secondary | ICD-10-CM | POA: Diagnosis not present

## 2023-12-30 DIAGNOSIS — I2510 Atherosclerotic heart disease of native coronary artery without angina pectoris: Secondary | ICD-10-CM

## 2023-12-30 LAB — GLUCOSE, CAPILLARY: Glucose-Capillary: 130 mg/dL — ABNORMAL HIGH (ref 70–99)

## 2023-12-30 MED ORDER — NITROGLYCERIN 0.4 MG SL SUBL
0.4000 mg | SUBLINGUAL_TABLET | SUBLINGUAL | 3 refills | Status: AC | PRN
  Filled 2023-12-30: qty 25, 7d supply, fill #0

## 2023-12-30 MED ORDER — ASPIRIN 81 MG PO TBEC
81.0000 mg | DELAYED_RELEASE_TABLET | Freq: Every day | ORAL | 2 refills | Status: AC
  Filled 2023-12-30: qty 150, 150d supply, fill #0

## 2023-12-30 MED ORDER — ROSUVASTATIN CALCIUM 20 MG PO TABS
20.0000 mg | ORAL_TABLET | Freq: Every day | ORAL | 3 refills | Status: DC
  Filled 2023-12-30: qty 90, 90d supply, fill #0

## 2023-12-30 NOTE — Plan of Care (Signed)
  Problem: Clinical Measurements: Goal: Ability to maintain clinical measurements within normal limits will improve Outcome: Completed/Met Goal: Will remain free from infection Outcome: Completed/Met Goal: Diagnostic test results will improve Outcome: Progressing Goal: Respiratory complications will improve Outcome: Completed/Met Goal: Cardiovascular complication will be avoided Outcome: Completed/Met   Problem: Activity: Goal: Risk for activity intolerance will decrease Outcome: Completed/Met   Problem: Nutrition: Goal: Adequate nutrition will be maintained Outcome: Completed/Met

## 2023-12-30 NOTE — Discharge Summary (Addendum)
 Discharge Summary    Patient ID: Gloria [name] MRN: [id number]; DOB: 19-Jan-1971  Admit date: 12/28/2023 Discharge date: 12/30/2023  PCP:  Mercer Clotilda JONELLE, MD   [facility] Providers Cardiologist:  Oneil Parchment, MD   {  Discharge Diagnoses    Principal Problem:   Unstable angina [facility]) Active Problems:   Essential hypertension   Mixed hyperlipidemia   Non-obstructive CAD  Diagnostic Studies/Procedures    LEFT HEART CATH AND CORONARY ANGIOGRAPHY 12/29/2023   Prox LAD lesion is 35% stenosed.   Prox RCA lesion is 15% stenosed.   The left ventricular systolic function is normal.   LV end diastolic pressure is normal.   The left ventricular ejection fraction is 55-65% by visual estimate.   Nonobstructive CAD Normal LV function Normal LVEDP   Plan: medical management/risk factor modification  ECHOCARDIOGRAM 09/27/2023 1. Left ventricular ejection fraction, by estimation, is 60 to 65%. The  left ventricle has normal function. The left ventricle has no regional  wall motion abnormalities. The left ventricular internal cavity size was  mildly dilated. Left ventricular  diastolic parameters were normal. The average left ventricular global  longitudinal strain is -20.1 %. The global longitudinal strain is normal.   2. Right ventricular systolic function is normal. The right ventricular  size is normal. Tricuspid regurgitation signal is inadequate for assessing  PA pressure.   3. The mitral valve is normal in structure. Trivial mitral valve  regurgitation. No evidence of mitral stenosis.   4. The aortic valve is normal in structure. Aortic valve regurgitation is  not visualized. No aortic stenosis is present.   5. Aortic dilatation noted. There is mild dilatation of the ascending  aorta, measuring 38 mm.   6. The inferior vena cava is normal in size with greater than 50%  respiratory variability, suggesting right atrial pressure of 3 mmHg.    _____________   History  of Present Illness     Gloria [name] is a 53 y.o. female with a past medical history of hypertension, fibromyalgia, hyperlipidemia, osteoarthritis, type 2 diabetes mellitus, obesity, depression who was seen in hospital with a chief complaint of chest pain.  Patient presented to [facility] ED on 12/28/2023 with a chief complaint of chest pain and tightness.  She described the chest pain as pressure that was persistent and moderate in intensity.  Described the location is located across her chest but did not radiate.  She reported associated shortness of breath but denied any PND, orthopnea, swelling, syncope, nausea/vomiting, diaphoresis.  She does have a history of anxiety and states that she believes the anxiety makes her chest pain worse.She reports no family history of heart disease, no active tobacco, alcohol, or illicit drug use.  She also presented with a positive RSV test but exhibited no URI symptoms.  In the ED she was afebrile, with vital signs of HR 68, BP 147/78, RR 18, SpO2 100% on room air.  In the ED her workup was unremarkable including negative troponins and a normal chest x-ray.  EKG showed normal sinus rhythm and was negative for ischemic changes.  But due to the persistence and severity of her chest pressure cardiology was consulted and recommended to admit to [facility] for left heart cath for further workup.  Patient was then transferred to [facility] and admitted to the cardiology service.  Hospital Course     Patient was transferred to [facility] and admitted to the cardiology service with plans to complete a left heart cath despite negative  troponin, due to her chest pain at rest and high risk for developing obstructive CAD with LPa of 165.  She was loaded with aspirin  and placed on a heparin  drip, made NPO for left heart cath.  She was also provided with sublingual nitroglycerin  as needed for chest pain.  Chest pain she experienced was responsive to the nitroglycerin .  In regards to her diabetes  she was placed on sliding scale insulin  while in the hospital.  Her home medications in regards to her hypertension and hyperlipidemia were continued.  Including amlodipine  5 mg daily, Coreg  6.25 mg twice daily, Crestor  20 mg daily.  She underwent a left heart cath and coronary angiography on 12/29/2023 by Dr. Jordan.  Results of these cath showed proximal LAD lesion 35% stenosed, proximal RCA lesion 15% stenosed, normal LV systolic function, normal LVEDP, estimated LVEF 55 to 65%.  With nonobstructive CAD, normal LV function, normal LVEDP the plan was to medically manage and risk factor modification.  Patient was seen this morning by Dr. Jeffrie who determined she was medically stable for discharge.  Plan includes continuing amlodipine  5 mg daily, Coreg  6.25 mg twice daily, Crestor  20 mg daily, aspirin  81 mg daily.  Due to her LDL goal being less than 70 with current LDL 96, LPA 165 recommended Mediterranean diet and exercise, but patient will follow-up with primary care physician Dr. Mercer to continue to monitor and treat LDL.  Due to the findings on her cardiac catheterization she has graduated from the clinic and will be seen in cardiology on an as-needed basis for follow-up per MD.    Did the patient have an acute coronary syndrome (MI, NSTEMI, STEMI, etc) this admission?:  No                               Did the patient have a percutaneous coronary intervention (stent / angioplasty)?:  No.      _____________  Discharge Vitals Blood pressure 118/75, pulse 75, temperature 98.2 F (36.8 C), temperature source Oral, resp. rate 14, height 5' 4 (1.626 m), weight 98.6 kg, last menstrual period 04/25/2013, SpO2 97%.  Filed Weights   12/29/23 0147  Weight: 98.6 kg   Labs & Radiologic Studies    CBC Recent Labs    12/28/23 1340 12/29/23 0420  WBC 9.8 10.6*  HGB 14.1 13.3  HCT 43.1 38.5  MCV 91.1 89.1  PLT 257 245   Basic Metabolic Panel Recent Labs    97/94/74 1340 12/29/23 0420  NA  135 136  K 4.2 3.7  CL 102 102  CO2 26 25  GLUCOSE 87 90  BUN 14 10  CREATININE 0.56 0.54  CALCIUM  8.9 8.2*  MG  --  2.0   Liver Function Tests Recent Labs    12/29/23 0420  AST 20  ALT 29  ALKPHOS 64  BILITOT 0.3  PROT 6.4*  ALBUMIN 3.4*   No results for input(s): LIPASE, AMYLASE in the last 72 hours. High Sensitivity Troponin:   Recent Labs  Lab 12/28/23 1340 12/28/23 1515  TROPONINIHS 6 6    BNP Invalid input(s): POCBNP D-Dimer Recent Labs    12/28/23 1340  DDIMER 0.37   Hemoglobin A1C No results for input(s): HGBA1C in the last 72 hours. Fasting Lipid Panel No results for input(s): CHOL, HDL, LDLCALC, TRIG, CHOLHDL, LDLDIRECT in the last 72 hours. Thyroid  Function Tests No results for input(s): TSH, T4TOTAL, T3FREE, THYROIDAB in the last  72 hours.  Invalid input(s): FREET3 _____________  CARDIAC CATHETERIZATION Result Date: 12/29/2023   Prox LAD lesion is 35% stenosed.   Prox RCA lesion is 15% stenosed.   The left ventricular systolic function is normal.   LV end diastolic pressure is normal.   The left ventricular ejection fraction is 55-65% by visual estimate. Nonobstructive CAD Normal LV function Normal LVEDP Plan: medical management/risk factor modification   DG Chest 2 View Result Date: 12/28/2023 CLINICAL DATA:  Chest pain. Chest tightness. Shortness of breath pain when taking a deep breath. Started last night. EXAM: CHEST - 2 VIEW COMPARISON:  AP chest 07/09/2023, 12/31/2007 FINDINGS: Cardiac silhouette and mediastinal contours are within normal limits. The lungs are clear. Minimal blunting of the right costophrenic angle is unchanged from prior, likely chronic scarring. No pleural effusion or pneumothorax. Mild dextrocurvature of the mid to upper thoracic spine, unchanged. ACDF hardware overlies the lower cervical spine. Right upper quadrant abdominal cholecystectomy clips. IMPRESSION: No active cardiopulmonary disease.  Electronically Signed   By: Tanda Lyons M.D.   On: 12/28/2023 14:49   Disposition   Pt is being discharged home today in good condition.  Follow-up Plans & Appointments   Patient will follow up with primary care provider Dr. Mercer for further cholesterol management with goal LDL less than 70.  Discharge Instructions     AMB Referral to Advanced Lipid Disorders Clinic   Complete by: As directed    PharmD for [facility]   Internal Lipid Clinic Referral Scheduling  Internal lipid clinic referrals are providers within [facility], who wish to refer established patients for routine management (help in starting PCSK9 inhibitor therapy) or advanced therapies.  Internal MD referral criteria:              1. All patients with LDL>190 mg/dL  2. All patients with Triglycerides >500 mg/dL  3. Patients with suspected or confirmed heterozygous familial hyperlipidemia (HeFH) or homozygous familial hyperlipidemia (HoFH)  4. Patients with family history of suspicious for genetic dyslipidemia desiring genetic testing  5. Patients refractory to standard guideline based therapy  6. Patients with statin intolerance (failed 2 statins, one of which must be a high potency statin)  7. Patients who the provider desires to be seen by MD   Internal PharmD referral criteria:   1. Follow-up patients for medication management  2. Follow-up for compliance monitoring  3. Patients for drug education  4. Patients with statin intolerance  5. PCSK9 inhibitor education and prior authorization approvals  6. Patients with triglycerides <500 mg/dL  External Lipid Clinic Referral  External lipid clinic referrals are for providers outside of [facility], considered new clinic patients - automatically routed to MD schedule   Ambulatory referral to Cardiology   Complete by: As directed    If you have not heard from the Cardiology office within the next 72 hours please call [phone].   Diet - low sodium heart healthy    Complete by: As directed    Discharge instructions   Complete by: As directed    Radial Site Care Refer to this sheet in the next few weeks. These instructions provide you with information on caring for yourself after your procedure. Your caregiver may also give you more specific instructions. Your treatment has been planned according to current medical practices, but problems sometimes occur. Call your caregiver if you have any problems or questions after your procedure. HOME CARE INSTRUCTIONS You may shower the day after the procedure. Remove the bandage (dressing) and  gently wash the site with plain soap and water. Gently pat the site dry.  Do not apply powder or lotion to the site.  Do not submerge the affected site in water for 3 to 5 days.  Inspect the site at least twice daily.  Do not flex or bend the affected arm for 24 hours.  No lifting over 5 pounds (2.3 kg) for 5 days after your procedure.  Do not drive home if you are discharged the same day of the procedure. Have someone else drive you.  You may drive 24 hours after the procedure unless otherwise instructed by your caregiver.  What to expect: Any bruising will usually fade within 1 to 2 weeks.  Blood that collects in the tissue (hematoma) may be painful to the touch. It should usually decrease in size and tenderness within 1 to 2 weeks.  SEEK IMMEDIATE MEDICAL CARE IF: You have unusual pain at the radial site.  You have redness, warmth, swelling, or pain at the radial site.  You have drainage (other than a small amount of blood on the dressing).  You have chills.  You have a fever or persistent symptoms for more than 72 hours.  You have a fever and your symptoms suddenly get worse.  Your arm becomes pale, cool, tingly, or numb.  You have heavy bleeding from the site. Hold pressure on the site.   Increase activity slowly   Complete by: As directed       Discharge Medications   Allergies as of 12/30/2023       Reactions    Benadryl  [diphenhydramine  Hcl] Hives   Metformin     Other Reaction(s): headache   Ropinirole  Other (See Comments)   Unknown rxn.   Levofloxacin Other (See Comments)   Unknown rxn   Codeine    Unknown rxn   Doxycycline    Unknown rxn   Lisinopril -hydrochlorothiazide  Other (See Comments)   Dry cough, dry eyes, insomnia, emotionally liable, increased thirst, brain fog, fatigue.   Metformin  And Related    Blurred vision   Nuvigil  [armodafinil ]    migraines   Olmesartan  Other (See Comments)   May have contributed to hyponatremia.  Baseline sodium 131-133.   Requip  [ropinirole  Hcl]    Increased headache   Semaglutide  Other (See Comments)   Rybelsus  caused depression and fibromyalgia flair.   Tramadol     Other reaction(s): Other (See Comments)   Wellbutrin [bupropion]    Unknown rxn.   Zolpidem     Unknown rxn.        Medication List     TAKE these medications    albuterol  108 (90 Base) MCG/ACT inhaler Commonly known as: VENTOLIN  HFA Inhale 2 puffs into the lungs every 4 (four) hours as needed for wheezing or shortness of breath.   amLODipine  5 MG tablet Commonly known as: NORVASC  Take 1 tablet (5 mg total) by mouth daily.   ascorbic acid  500 MG tablet Commonly known as: VITAMIN C  Take 500 mg by mouth daily.   aspirin  EC 81 MG tablet Take 1 tablet (81 mg total) by mouth daily. Swallow whole.   azelastine  0.1 % nasal spray Commonly known as: ASTELIN  Place 2 sprays into both nostrils 2 (two) times daily.   B COMPLEX PO Take 1 tablet by mouth daily.   blood glucose meter kit and supplies Kit Dispense based on patient and insurance preference. Use up to four times daily as directed. (FOR ICD-9 250.00, 250.01).   Calcium  Carbonate-Vitamin D  600-400 MG-UNIT tablet  Take 1 tablet by mouth daily.   carvedilol  6.25 MG tablet Commonly known as: COREG  TAKE 1 TABLET(6.25 MG) BY MOUTH TWICE DAILY WITH A MEAL   cetirizine 10 MG tablet Commonly known as: ZYRTEC Take  10 mg by mouth daily.   Contour Next Test test strip Generic drug: glucose blood Use to test blood glucose twice daily   CoQ10 100 MG Caps Take 100 mg by mouth daily.   D3-50 1.25 MG (50000 UT) capsule Generic drug: Cholecalciferol Take 50,000 Units by mouth once a week.   estradiol  0.05 MG/24HR patch Commonly known as: VIVELLE -DOT Place 1 patch (0.05 mg total) onto the skin 2 (two) times a week.   fluticasone  0.05 % cream Commonly known as: CUTIVATE  Apply 1 Application topically 2 (two) times daily as needed (Behind ear/Behind belly flap).   fluticasone  50 MCG/ACT nasal spray Commonly known as: FLONASE  Place 2 sprays into both nostrils daily.   hydrOXYzine  10 MG tablet Commonly known as: ATARAX  Take 20 mg by mouth daily as needed for anxiety.   ibuprofen  800 MG tablet Commonly known as: ADVIL  TAKE 1 TABLET(800 MG) BY MOUTH EVERY 8 HOURS AS NEEDED FOR MODERATE PAIN   Magnesium  500 MG Caps Take 500 mg by mouth at bedtime.   MULTIVITAMIN PO Take 1 tablet by mouth daily.   nitroGLYCERIN  0.4 MG SL tablet Commonly known as: NITROSTAT  Place 1 tablet (0.4 mg total) under the tongue every 5 (five) minutes as needed for chest pain.   OMEGA 3-6-9 FATTY ACIDS PO Take 500 mg by mouth daily.   OneTouch Delica Plus Lancet30G Misc USE 1  TO CHECK GLUCOSE 4 TIMES DAILY   OneTouch Verio Flex System w/Device Kit Use to check Blood sugars twice daily before meals   oxcarbazepine  600 MG tablet Commonly known as: TRILEPTAL  Take 600 mg by mouth 2 (two) times daily.   Rexulti  4 MG Tabs Generic drug: Brexpiprazole  Take 4 mg by mouth daily.   rizatriptan  10 MG tablet Commonly known as: MAXALT  TAKE 1 TABLET BY MOUTH AS NEEDED FOR MIGRAINE   rosuvastatin  20 MG tablet Commonly known as: CRESTOR  Take 1 tablet (20 mg total) by mouth daily.   thiamine  50 MG tablet Commonly known as: VITAMIN B-1 Take 50 mg by mouth daily.   tirzepatide  15 MG/0.5ML Pen Commonly known as:  MOUNJARO  Inject 15 mg into the skin once a week.   VITAMIN E PO Take 900 mg by mouth daily.         Duration of Discharge Encounter: APP Time: 20 minutes   Signed, Waddell DELENA Donath, PA-C 12/30/2023, 11:26 AM   Personally seen and examined. Agree with above.  53 year old with chest discomfort compatible with unstable angina who underwent cardiac catheterization which showed a 35% mid LAD stenosis, minimal, nonflow limiting with prior coronary calcium  score which was elevated.  Reassuring heart catheterization as above.  Medication management reviewed recommended low-dose aspirin  81 mg given coronary plaque also recommended rosuvastatin  20 mg once a day to help improve LDL.  LDL goal should be less than 70.  Dr. Mercer primary care physician may continue to monitor on an outpatient basis.  Will be happy to help with further questions if necessary in the future.  I am comfortable with her graduating from the cardiology clinic at this time.  She does understand that she can always reach out if necessary.  Cardiac catheterization with minimal plaque   Diagnostic Dominance: Right  Troponin normal   Continue 5 of amlodipine  6.25  mg of carvedilol  twice a day (home meds)   Continue with Crestor  20 mg prior LDL 96.  LDL goal less than 70 given CAD as above.   LP(a) 165, elevated.  Statin therapy. ASA 81mg  Mediterranean diet, exercise.   Chest discomfort was likely musculoskeletal in origin/stress related.   Okay for discharge.  Primary care physician follow-up, Dr. Mercer to continue to monitor LDL.  LDL goal is less than 70.   As needed follow-up in cardiology clinic.  She has graduated from the clinic based upon her current findings.  Please let us  know if we can be of further assistance.   Oneil Parchment, MD  Time spent on discharge: 15 minutes in discussion with patient, documentation, review of medical records.

## 2023-12-30 NOTE — Plan of Care (Signed)
  Problem: Education: Goal: Knowledge of General Education information will improve Description: Including pain rating scale, medication(s)/side effects and non-pharmacologic comfort measures Outcome: Progressing   Problem: Health Behavior/Discharge Planning: Goal: Ability to manage health-related needs will improve Outcome: Progressing   Problem: Clinical Measurements: Goal: Diagnostic test results will improve Outcome: Progressing   Problem: Coping: Goal: Level of anxiety will decrease Outcome: Progressing   Problem: Elimination: Goal: Will not experience complications related to bowel motility Outcome: Progressing Goal: Will not experience complications related to urinary retention Outcome: Progressing   Problem: Pain Managment: Goal: General experience of comfort will improve and/or be controlled Outcome: Progressing   Problem: Safety: Goal: Ability to remain free from injury will improve Outcome: Progressing   Problem: Skin Integrity: Goal: Risk for impaired skin integrity will decrease Outcome: Progressing   Problem: Education: Goal: Understanding of cardiac disease, CV risk reduction, and recovery process will improve Outcome: Progressing Goal: Individualized Educational Video(s) Outcome: Progressing   Problem: Activity: Goal: Ability to tolerate increased activity will improve Outcome: Progressing   Problem: Cardiac: Goal: Ability to achieve and maintain adequate cardiovascular perfusion will improve Outcome: Progressing   Problem: Health Behavior/Discharge Planning: Goal: Ability to safely manage health-related needs after discharge will improve Outcome: Progressing   Problem: Education: Goal: Ability to describe self-care measures that may prevent or decrease complications (Diabetes Survival Skills Education) will improve Outcome: Progressing Goal: Individualized Educational Video(s) Outcome: Progressing   Problem: Coping: Goal: Ability to adjust  to condition or change in health will improve Outcome: Progressing   Problem: Fluid Volume: Goal: Ability to maintain a balanced intake and output will improve Outcome: Progressing   Problem: Health Behavior/Discharge Planning: Goal: Ability to identify and utilize available resources and services will improve Outcome: Progressing Goal: Ability to manage health-related needs will improve Outcome: Progressing   Problem: Metabolic: Goal: Ability to maintain appropriate glucose levels will improve Outcome: Progressing   Problem: Nutritional: Goal: Maintenance of adequate nutrition will improve Outcome: Progressing Goal: Progress toward achieving an optimal weight will improve Outcome: Progressing   Problem: Skin Integrity: Goal: Risk for impaired skin integrity will decrease Outcome: Progressing   Problem: Tissue Perfusion: Goal: Adequacy of tissue perfusion will improve Outcome: Progressing   Problem: Education: Goal: Understanding of CV disease, CV risk reduction, and recovery process will improve Outcome: Progressing Goal: Individualized Educational Video(s) Outcome: Progressing   Problem: Activity: Goal: Ability to return to baseline activity level will improve Outcome: Progressing   Problem: Cardiovascular: Goal: Ability to achieve and maintain adequate cardiovascular perfusion will improve Outcome: Progressing Goal: Vascular access site(s) Level 0-1 will be maintained Outcome: Progressing   Problem: Health Behavior/Discharge Planning: Goal: Ability to safely manage health-related needs after discharge will improve Outcome: Progressing

## 2023-12-30 NOTE — Progress Notes (Addendum)
 Cardiac catheterization with minimal plaque  Diagnostic Dominance: Right  Troponin normal  Continue 5 of amlodipine  6.25 mg of carvedilol  twice a day (home meds)  Continue with Crestor  20 mg prior LDL 96.  LDL goal less than 70 given CAD as above.  LP(a) 165, elevated.  Statin therapy. ASA 81mg  Mediterranean diet, exercise.  Chest discomfort was likely musculoskeletal in origin/stress related.  Okay for discharge.  Primary care physician follow-up, Dr. Mercer to continue to monitor LDL.  LDL goal is less than 70.  As needed follow-up in cardiology clinic.  She has graduated from the clinic based upon her current findings.  Please let us  know if we can be of further assistance.  Oneil Parchment, MD

## 2023-12-30 NOTE — Plan of Care (Signed)
  Problem: Education: Goal: Knowledge of General Education information will improve Description: Including pain rating scale, medication(s)/side effects and non-pharmacologic comfort measures Outcome: Completed/Met   Problem: Health Behavior/Discharge Planning: Goal: Ability to manage health-related needs will improve Outcome: Completed/Met   Problem: Clinical Measurements: Goal: Diagnostic test results will improve Outcome: Completed/Met   Problem: Coping: Goal: Level of anxiety will decrease Outcome: Completed/Met   Problem: Elimination: Goal: Will not experience complications related to bowel motility Outcome: Completed/Met Goal: Will not experience complications related to urinary retention Outcome: Completed/Met   Problem: Pain Managment: Goal: General experience of comfort will improve and/or be controlled Outcome: Completed/Met   Problem: Safety: Goal: Ability to remain free from injury will improve Outcome: Completed/Met   Problem: Safety: Goal: Ability to remain free from injury will improve Outcome: Completed/Met   Problem: Skin Integrity: Goal: Risk for impaired skin integrity will decrease Outcome: Completed/Met   Problem: Activity: Goal: Ability to tolerate increased activity will improve Outcome: Completed/Met   Problem: Cardiac: Goal: Ability to achieve and maintain adequate cardiovascular perfusion will improve Outcome: Completed/Met   Problem: Health Behavior/Discharge Planning: Goal: Ability to safely manage health-related needs after discharge will improve Outcome: Completed/Met   Problem: Education: Goal: Ability to describe self-care measures that may prevent or decrease complications (Diabetes Survival Skills Education) will improve Outcome: Completed/Met Goal: Individualized Educational Video(s) Outcome: Completed/Met   Problem: Coping: Goal: Ability to adjust to condition or change in health will improve Outcome: Completed/Met    Problem: Health Behavior/Discharge Planning: Goal: Ability to identify and utilize available resources and services will improve Outcome: Completed/Met Goal: Ability to manage health-related needs will improve Outcome: Completed/Met   Problem: Fluid Volume: Goal: Ability to maintain a balanced intake and output will improve Outcome: Completed/Met   Problem: Metabolic: Goal: Ability to maintain appropriate glucose levels will improve Outcome: Completed/Met   Problem: Nutritional: Goal: Maintenance of adequate nutrition will improve Outcome: Completed/Met Goal: Progress toward achieving an optimal weight will improve Outcome: Completed/Met   Problem: Skin Integrity: Goal: Risk for impaired skin integrity will decrease Outcome: Completed/Met   Problem: Tissue Perfusion: Goal: Adequacy of tissue perfusion will improve Outcome: Completed/Met   Problem: Education: Goal: Understanding of CV disease, CV risk reduction, and recovery process will improve Outcome: Completed/Met   Problem: Activity: Goal: Ability to return to baseline activity level will improve Outcome: Completed/Met   Problem: Cardiovascular: Goal: Ability to achieve and maintain adequate cardiovascular perfusion will improve Outcome: Completed/Met Goal: Vascular access site(s) Level 0-1 will be maintained Outcome: Completed/Met   Problem: Health Behavior/Discharge Planning: Goal: Ability to safely manage health-related needs after discharge will improve Outcome: Completed/Met

## 2024-01-02 ENCOUNTER — Telehealth: Payer: Self-pay

## 2024-01-02 NOTE — Telephone Encounter (Signed)
 RSV is usually mild in adults, symptomatic treatment is usually recommended. If cough is mild and she does not have wheezing, she can manage it with rest, adequate hydration,and tylenol  if fever/muscle aches. She can arrange an appt if she notes wheezing or cough gets worse. If respiratory distress she needs to go to the ER. Thanks, BJ

## 2024-01-02 NOTE — Telephone Encounter (Signed)
 Copied from CRM [id number]. Topic: Clinical - Medical Advice >> Jan 02, 2024 10:20 AM Gloria [name] wrote: Reason for CRM: Patient called in stating she was tested positive for RSV last week, even though she wasn't showing symptoms. Now she is showing symptoms today and wanted to know what she should do , she is requesting a callback

## 2024-01-02 NOTE — Transitions of Care (Post Inpatient/ED Visit) (Signed)
 01/02/2024  Name: Gloria [name] MRN: [id number] DOB: 08/07/1971  Today's TOC FU Call Status: Today's TOC FU Call Status:: Successful TOC FU Call Completed TOC FU Call Complete Date: 01/02/24 Patient's Name and Date of Birth confirmed.  Transition Care Management Follow-up Telephone Call Date of Discharge: 12/30/23 Discharge Facility: [facility]) Type of Discharge: Inpatient Admission Primary Inpatient Discharge Diagnosis:: viral disease How have you been since you were released from the hospital?: Better Any questions or concerns?: No  Items Reviewed: Did you receive and understand the discharge instructions provided?: Yes Medications obtained,verified, and reconciled?: Yes (Medications Reviewed) Any new allergies since your discharge?: No Dietary orders reviewed?: Yes Do you have support at home?: No  Medications Reviewed Today: Medications Reviewed Today     Reviewed by Darrall Ellison, LPN (Licensed Practical Nurse) on 01/02/24 at 1057  Med List Status: <None>   Medication Order Taking? Sig Documenting Provider Last Dose Status Informant  albuterol  (VENTOLIN  HFA) 108 (90 Base) MCG/ACT inhaler [id number] No Inhale 2 puffs into the lungs every 4 (four) hours as needed for wheezing or shortness of breath. [provider] 12/28/2023 Morning Active Self, Pharmacy Records  amLODipine  (NORVASC ) 5 MG tablet [id number] No Take 1 tablet (5 mg total) by mouth daily. Donley Furth, MD 12/28/2023 Morning Active Self, Pharmacy Records  aspirin  EC 81 MG tablet [id number]  Take 1 tablet (81 mg total) by mouth daily. Swallow whole. Parcells, Cyrilla Drivers, PA-C  Active   azelastine  (ASTELIN ) 0.1 % nasal spray [id number] No Place 2 sprays into both nostrils 2 (two) times daily. Rodney Clamp, MD 12/28/2023 Morning Active Self, Pharmacy Records  B Complex Vitamins (B COMPLEX PO) [id number] No Take 1 tablet by mouth daily. [provider] 12/28/2023 Morning Active Self, Pharmacy Records   blood glucose meter kit and supplies KIT [id number]  Dispense based on patient and insurance preference. Use up to four times daily as directed. (FOR ICD-9 250.00, 250.01). Viola Greulich, MD  Active Self, Pharmacy Records  Blood Glucose Monitoring Suppl [facility] VERIO FLEX SYSTEM) w/Device Suzanne Erps [id number]  Use to check Blood sugars twice daily before meals Viola Greulich, MD  Active Self, Pharmacy Records  Brexpiprazole  (REXULTI ) 4 MG TABS [id number] No Take 4 mg by mouth daily. [provider] 12/28/2023 Morning Active Self, Pharmacy Records  Calcium  Carbonate-Vitamin D  600-400 MG-UNIT tablet [id number] No Take 1 tablet by mouth daily. [provider] 12/28/2023 Morning Active Self, Pharmacy Records  carvedilol  (COREG ) 6.25 MG tablet [id number] No TAKE 1 TABLET(6.25 MG) BY MOUTH TWICE DAILY WITH A MEAL Viola Greulich, MD 12/28/2023 Morning Active Self, Pharmacy Records  cetirizine (ZYRTEC) 10 MG tablet [id number] No Take 10 mg by mouth daily. [provider] 12/28/2023 Morning Active Self, Pharmacy Records  Coenzyme Q10 (COQ10) 100 MG CAPS [id number] No Take 100 mg by mouth daily. [provider] 12/28/2023 Morning Active Self, Pharmacy Records  D3-50 1.25 MG (50000 UT) capsule [id number] No Take 50,000 Units by mouth once a week. [provider] Past Week Active Self, Pharmacy Records  estradiol  (VIVELLE -DOT) 0.05 MG/24HR patch [id number] No Place 1 patch (0.05 mg total) onto the skin 2 (two) times a week. Reinaldo Caras, MD Past Week Active Self, Pharmacy Records  fluticasone  (CUTIVATE ) 0.05 % cream [id number]  Apply 1 Application topically 2 (two) times daily as needed (Behind ear/Behind belly flap). [provider]  Active Self, Pharmacy Records           Med Note Yves Herb,  MEGAN   Thu Dec 29, 2023  7:53 AM) Unknown last dose.  fluticasone  (FLONASE ) 50 MCG/ACT nasal spray [id number] No Place 2 sprays into both nostrils daily. [provider]  Past Week Active Self, Pharmacy Records  glucose blood (CONTOUR NEXT TEST) test strip [id number]  Use to test blood glucose twice daily Viola Greulich, MD  Active Self, Pharmacy Records  hydrOXYzine  (ATARAX ) 10 MG tablet [id number]  Take 20 mg by mouth daily as needed for anxiety. [provider]  Active Self, Pharmacy Records           Med Note Yves Herb, MEGAN   Thu Dec 29, 2023  7:53 AM) Unknown last dose.  ibuprofen  (ADVIL ) 800 MG tablet [id number] No TAKE 1 TABLET(800 MG) BY MOUTH EVERY 8 HOURS AS NEEDED FOR MODERATE PAIN Viola Greulich, MD 12/28/2023 Morning Active Self, Pharmacy Records  Lancets [facility] DELICA PLUS [place]) MISC [id number]  USE 1  TO CHECK GLUCOSE 4 TIMES DAILY Viola Greulich, MD  Active Self, Pharmacy Records  Magnesium  500 MG CAPS [id number] No Take 500 mg by mouth at bedtime. [provider] 12/28/2023 Morning Active Self, Pharmacy Records  Multiple Vitamins-Minerals (MULTIVITAMIN PO) [id number] No Take 1 tablet by mouth daily.  [provider] 12/28/2023 Morning Active Self, Pharmacy Records  nitroGLYCERIN  (NITROSTAT ) 0.4 MG SL tablet [id number]  Place 1 tablet (0.4 mg total) under the tongue every 5 (five) minutes as needed for chest pain. Jiles Mote, PA-C  Active   OMEGA 3-6-9 FATTY ACIDS PO [id number] No Take 500 mg by mouth daily. [provider] 12/28/2023 Morning Active Self, Pharmacy Records  oxcarbazepine  (TRILEPTAL ) 600 MG tablet [id number] No Take 600 mg by mouth 2 (two) times daily. [provider] 12/28/2023 Morning Active Self, Pharmacy Records  rizatriptan  (MAXALT ) 10 MG tablet [id number]  TAKE 1 TABLET BY MOUTH AS NEEDED FOR MIGRAINE Viola Greulich, MD  Active Self, Pharmacy Records           Med Note Yves Herb, [facility]   Thu Dec 29, 2023  7:54 AM) Unknown last dose.  rosuvastatin  (CRESTOR ) 20 MG tablet [id number]  Take 1 tablet (20 mg total) by mouth daily. Parcells, Cyrilla Drivers, PA-C  Active   thiamine  (VITAMIN B-1)  50 MG tablet [id number] No Take 50 mg by mouth daily. [provider] 12/28/2023 Morning Active Self, Pharmacy Records  tirzepatide  (MOUNJARO ) 15 MG/0.5ML Pen [id number] No Inject 15 mg into the skin once a week. Jasmine Mesi D, MD Past Week Active Self, Pharmacy Records  vitamin C  (ASCORBIC ACID ) 500 MG tablet [id number] No Take 500 mg by mouth daily. [provider] 12/28/2023 Morning Active Self, Pharmacy Records  VITAMIN E PO [id number] No Take 900 mg by mouth daily. [provider] 12/28/2023 Morning Active Self, Pharmacy Records            Home Care and Equipment/Supplies: Were Home Health Services Ordered?: NA Any new equipment or medical supplies ordered?: NA  Functional Questionnaire: Do you need assistance with bathing/showering or dressing?: No Do you need assistance with meal preparation?: No Do you need assistance with eating?: No Do you have difficulty maintaining continence: No Do you need assistance with getting out of bed/getting out of a chair/moving?: No Do you have difficulty managing or taking your medications?: No  Follow up appointments reviewed: PCP Follow-up appointment confirmed?: Yes Date of PCP follow-up appointment?: 01/12/24 Follow-up Provider: [facility] Follow-up appointment confirmed?: NA Do you need transportation to your follow-up appointment?: No  Do you understand care options if your condition(s) worsen?: Yes-patient verbalized understanding    SIGNATURE Darrall Ellison, LPN [facility] Nurse Health Advisor Direct Dial [phone]

## 2024-01-03 NOTE — Telephone Encounter (Signed)
Spoke with patient she is aware

## 2024-01-05 ENCOUNTER — Telehealth: Payer: Self-pay | Admitting: *Deleted

## 2024-01-05 NOTE — Telephone Encounter (Signed)
Copied from CRM [id number]. Topic: Clinical - Medical Advice >> Jan 05, 2024  3:58 PM [place] wrote: Reason for CRM: Patient was referred to Oncology today and wants to speak to Dr Salomon Fick as soon as possible.

## 2024-01-06 ENCOUNTER — Telehealth: Payer: Self-pay

## 2024-01-06 ENCOUNTER — Encounter: Payer: Self-pay | Admitting: Family Medicine

## 2024-01-06 NOTE — Telephone Encounter (Signed)
Attempted to call pt regarding recent Ortho visit with concerning fx.  VM left.

## 2024-01-06 NOTE — Telephone Encounter (Signed)
Copied from CRM [id number]. Topic: General - Call Back - No Documentation >> Jan 06, 2024 10:01 AM Corin V wrote: Reason for CRM: Patient missed a call from [facility] and would like a call back as soon as possible. No documentation in the chart when patient called back.

## 2024-01-06 NOTE — Telephone Encounter (Signed)
Spoke with patient, patient is send ov notes from ortho/Onc

## 2024-01-09 ENCOUNTER — Telehealth (INDEPENDENT_AMBULATORY_CARE_PROVIDER_SITE_OTHER): Payer: Self-pay | Admitting: Family Medicine

## 2024-01-09 ENCOUNTER — Telehealth: Payer: Self-pay

## 2024-01-09 NOTE — Telephone Encounter (Signed)
-----   Message from Nicki [place] sent at 01/06/2024 12:21 PM EST ----- LP(a) increased at 165.2.  Chemistry normal.  Lipid panel with LDL cholesterol at 96, improved from previously at 139 following initiation of rosuvastatin.  However with increased LP(a) recommend target LDL less than 50.  Recommend adding Zetia 10 mg.  Recheck c-Met and lipid panel

## 2024-01-09 NOTE — Telephone Encounter (Signed)
Good afternoon!  I called to cancel Patient's 02/18 appointment and she asked that her prescription be updated between now and her next appointment on 03/11. She requested to be called with an update.

## 2024-01-09 NOTE — Addendum Note (Signed)
Addended by: Shon Hale on: 01/09/2024 08:52 AM   Modules accepted: Orders

## 2024-01-10 ENCOUNTER — Ambulatory Visit (INDEPENDENT_AMBULATORY_CARE_PROVIDER_SITE_OTHER): Payer: 59 | Admitting: Family Medicine

## 2024-01-11 ENCOUNTER — Other Ambulatory Visit (INDEPENDENT_AMBULATORY_CARE_PROVIDER_SITE_OTHER): Payer: Self-pay | Admitting: Family Medicine

## 2024-01-11 ENCOUNTER — Encounter (INDEPENDENT_AMBULATORY_CARE_PROVIDER_SITE_OTHER): Payer: Self-pay | Admitting: Family Medicine

## 2024-01-11 DIAGNOSIS — E1169 Type 2 diabetes mellitus with other specified complication: Secondary | ICD-10-CM

## 2024-01-11 MED ORDER — TIRZEPATIDE 15 MG/0.5ML ~~LOC~~ SOAJ
15.0000 mg | SUBCUTANEOUS | 0 refills | Status: DC
Start: 2024-01-11 — End: 2024-01-31

## 2024-01-12 ENCOUNTER — Ambulatory Visit: Payer: Managed Care, Other (non HMO) | Admitting: Physician Assistant

## 2024-01-12 ENCOUNTER — Ambulatory Visit: Payer: Managed Care, Other (non HMO) | Admitting: Family Medicine

## 2024-01-12 ENCOUNTER — Encounter: Payer: Self-pay | Admitting: Family Medicine

## 2024-01-12 VITALS — BP 162/90 | HR 66 | Temp 98.2°F | Ht 64.0 in | Wt 219.2 lb

## 2024-01-12 DIAGNOSIS — E782 Mixed hyperlipidemia: Secondary | ICD-10-CM | POA: Diagnosis not present

## 2024-01-12 DIAGNOSIS — I10 Essential (primary) hypertension: Secondary | ICD-10-CM | POA: Diagnosis not present

## 2024-01-12 DIAGNOSIS — I25119 Atherosclerotic heart disease of native coronary artery with unspecified angina pectoris: Secondary | ICD-10-CM

## 2024-01-12 DIAGNOSIS — M899 Disorder of bone, unspecified: Secondary | ICD-10-CM

## 2024-01-12 DIAGNOSIS — E871 Hypo-osmolality and hyponatremia: Secondary | ICD-10-CM

## 2024-01-12 NOTE — Progress Notes (Unsigned)
 Established Patient Office Visit   Subjective  Patient ID: Gloria [name], female    DOB: 05-07-1971  Age: 53 y.o. MRN: [id number]  Chief Complaint  Patient presents with  . Hypertension    Pt is a 53 yo female with pmh sig for obesity, HTN, HLD, DM II, atherosclerosis, and hyponatremia seen for f/u.  Pt seen by Ortho for continued R lower leg pain s/p fall 1 mo + ago.  Imaging with concern for low grade chondrosarcoma.  Seen by OrthoOnc in [place] today.  Surgery scheduled in the next few wks.    Patient Active Problem List   Diagnosis Date Noted  . Non-obstructive CAD 12/30/2023  . Unstable angina (HCC) 12/28/2023  . Drug-induced weight loss 10/24/2023  . Chronic kidney disease   . Class 2 obesity due to excess calories with body mass index (BMI) of 38.0 to 38.9 in adult 07/19/2023  . Hyponatremia 07/09/2023  . BMI 38.0-38.9,adult 05/24/2023  . Chronic pain of left ankle 02/26/2023  . Other osteoarthritis of spine 01/04/2023  . BMI 40.0-44.9, adult (HCC) 01/04/2023  . Obesity, Beginning BMI 45.60 01/04/2023  . Nasal turbinate hypertrophy 12/10/2022  . Nasal vestibulitis 12/10/2022  . Low back pain 10/07/2022  . Pre-hypertension 08/05/2022  . Mixed hyperlipidemia 07/14/2022  . Stress 06/09/2022  . Essential hypertension 05/03/2022  . At risk for heart disease 05/03/2022  . Vitamin D deficiency 04/07/2022  . At risk for hypoglycemia 04/07/2022  . Acquired cavovarus deformity of right foot 07/08/2021  . Rupture of peroneal tendon 07/08/2021  . Pain of left hip joint 05/22/2021  . Lumbar radiculopathy 03/16/2021  . Non-restorative sleep 10/25/2020  . Gasping for breath 09/23/2020  . Class 3 severe obesity with serious comorbidity and body mass index (BMI) of 45.0 to 49.9 in adult [facility]) 09/23/2020  . Recurrent hypersomnia 09/23/2020  . MDD (recurrent major depressive disorder) in remission (HCC) 09/23/2020  . Sacroiliac joint pain 06/28/2020  . Diabetes mellitus (HCC)  04/06/2019  . Degeneration of lumbar intervertebral disc 05/31/2018  . Nausea 06/03/2016  . Prediabetes 05/13/2016  . Migraine without status migrainosus, not intractable 03/30/2016  . Bilateral lower extremity edema 03/03/2016  . Allergic rhinitis 01/16/2016  . Arthralgia 01/16/2016  . Myalgia 01/16/2016  . H/O metrorrhagia 01/16/2016  . Disease of nasal cavity and sinuses 01/16/2016  . Diarrhea 01/16/2016  . Cafe au lait spots 01/16/2016  . Constipation 01/16/2016  . IBS (irritable bowel syndrome) 01/16/2016  . Sacroiliac joint dysfunction 01/16/2016  . OSA on CPAP 02/25/2015  . Chronic back pain 02/25/2015  . Periodic limb movement disorder 02/25/2014  . Restless legs syndrome 12/15/2013  . Cardiac murmur 10/16/2013  . Snoring 10/16/2013  . Hx of migraine headaches 10/16/2013  . Fluid retention 10/12/2013  . Weight gain 10/12/2013  . Anxiety 10/12/2013  . Alcoholic liver disease (HCC) 10/28/2012  . Ex-cigarette smoker 10/28/2012  . Fibromyalgia 10/27/2012  . Depression with anxiety 10/27/2012  . GERD (gastroesophageal reflux disease) 10/27/2012  . H/O ETOH abuse 10/27/2012   Past Medical History:  Diagnosis Date  . Alcohol abuse   . Allergy   . Anemia    hx of- prior to hysterectomy  . Anxiety   . Arthritis   . Bilateral swelling of feet   . Bulging disc    X 2  . Chronic active hepatitis (HCC)   . Chronic back pain 02/25/2015  . Chronic fatigue syndrome   . Chronic headaches   . Chronic kidney disease  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  . DDD (degenerative disc disease), lumbar   . Depression   . Diabetes (HCC)   . Diverticulosis   . Drug use   . Elevated LFTs   . Fibromyalgia   . GERD (gastroesophageal reflux disease)   . History of ETOH abuse   . Hyperlipidemia   . Hyperplastic colon polyp   . Hyponatremia    in the past  . Internal hemorrhoids   . Migraines   . NAFLD (nonalcoholic fatty liver disease)   . Obese   . Obstructive sleep apnea  02/25/2015   not diagnosed per pt 04-03-20  . Osteoarthritis    lower back- DDD  . Periodic limb movement disorder 02/25/2014  . Pre-diabetes    pt taking metformin  . RLS (restless legs syndrome)   . Sleep apnea   . Smoker   . Snoring   . Substance abuse (HCC)   . Swallowing difficulty   . Vitamin B12 deficiency   . Vitamin D deficiency    Past Surgical History:  Procedure Laterality Date  . CERVICAL FUSION    . COLONOSCOPY    . CYSTOSCOPY  06/06/2013   Procedure: CYSTOSCOPY;  Surgeon: Ok Edwards, MD;  Location: [facility];  Service: Gynecology;;  . EXCISION METACARPAL MASS Right 09/03/2021   Procedure: EXCISION METACARPAL MASS RIGHT SMALL FINGER;  Surgeon: Cindee Salt, MD;  Location: [facility];  Service: Orthopedics;  Laterality: Right;  . INTRAUTERINE DEVICE INSERTION  08/09/2008   PARAGUARD- removed  . kidney surgery  1995   abcess  . LAPAROSCOPY N/A 06/06/2013   Procedure: LAPAROSCOPY DIAGNOSTIC;  Surgeon: Ok Edwards, MD;  Location: [facility];  Service: Gynecology;  Laterality: N/A;  . LAPAROTOMY  06/06/2013   Procedure: EXPLORATORY LAPAROTOMY;  Surgeon: Ok Edwards, MD;  Location: [facility];  Service: Gynecology;;  . LEFT HEART CATH AND CORONARY ANGIOGRAPHY N/A 12/29/2023   Procedure: LEFT HEART CATH AND CORONARY ANGIOGRAPHY;  Surgeon: [place], Peter M, MD;  [facility]: [facility] IN[facility]ASIVE [facility];  Service: Cardiovascular;  Laterality: N/A;  . LIVER BIOPSY    . partial right kideny  Right   . radial tunnel Bilateral 2005  . VAGINAL HYSTERECTOMY Left 06/06/2013   Procedure: Vaginal Hysterectomy with Patiial Right Salpingectomy;  Surgeon: Ok Edwards, MD;  Location: [facility];  Service: Gynecology;  Laterality: Left;   Social History   Tobacco Use  . Smoking status: Former    Current packs/day: 0.00    Types: Cigarettes    Quit date: 01/26/2012    Years since quitting: 11.9  . Smokeless tobacco: Never  Vaping Use  . Vaping status: Never Used  Substance  Use Topics  . Alcohol use: No    Alcohol/week: 0.0 standard drinks of alcohol    Comment: History of alcohol abuse  . Drug use: No   Family History  Problem Relation Age of Onset  . Cancer Mother   . Breast cancer Mother   . Depression Mother   . Anxiety disorder Mother   . Obesity Mother   . Sleep apnea Mother   . Obesity Father   . Anxiety disorder Father   . Cancer Father   . Hyperlipidemia Father   . Colon polyps Father   . Diabetes Father   . Cirrhosis Father   . Liver cancer Father   . Liver disease Father   . Alcoholism Father   . Depression Father   . Alcohol abuse Father   .  Sleep apnea Father   . Irritable bowel syndrome Sister   . Sleep apnea Brother   . Breast cancer Maternal Aunt   . Alcoholism Maternal Aunt   . Alcoholism Maternal Grandmother   . Alcoholism Maternal Grandfather   . Breast cancer Paternal Grandmother   . Colon cancer Paternal Grandmother   . Esophageal cancer Paternal Grandfather   . Stomach cancer Paternal Grandfather   . Alcoholism Paternal Grandfather   . Rectal cancer Neg Hx    Allergies  Allergen Reactions  . Benadryl [Diphenhydramine Hcl] Hives  . Metformin     Other Reaction(s): headache  . Ropinirole Other (See Comments)    Unknown rxn.  . Levofloxacin Other (See Comments)    Unknown rxn  . Codeine     Unknown rxn  . Doxycycline     Unknown rxn  . Lisinopril-Hydrochlorothiazide Other (See Comments)    Dry cough, dry eyes, insomnia, emotionally liable, increased thirst, brain fog, fatigue.  . Metformin And Related     Blurred vision  . Nuvigil [Armodafinil]     migraines   . Olmesartan Other (See Comments)    May have contributed to hyponatremia.  Baseline sodium 131-133.  [name] Requip [Ropinirole Hcl]     Increased headache  . Semaglutide Other (See Comments)    Rybelsus caused depression and fibromyalgia flair.  . Tramadol     Other reaction(s): Other (See Comments)  . Wellbutrin [Bupropion]     Unknown rxn.  [name]  Zolpidem     Unknown rxn.      ROS Negative unless stated above    Objective:     LMP 04/25/2013  BP Readings from Last 3 Encounters:  01/12/24 (!) 162/90  12/30/23 118/75  12/26/23 (!) 147/78   Wt Readings from Last 3 Encounters:  01/12/24 219 lb 3.2 oz (99.4 kg)  12/29/23 217 lb 6.4 oz (98.6 kg)  12/26/23 219 lb 3.2 oz (99.4 kg)     Physical Exam Constitutional:      General: She is not in acute distress.    Appearance: Normal appearance.  HENT:     Head: Normocephalic and atraumatic.     Nose: Nose normal.     Mouth/Throat:     Mouth: Mucous membranes are moist.  Cardiovascular:     Rate and Rhythm: Normal rate and regular rhythm.     Heart sounds: Normal heart sounds. No murmur heard.    No gallop.  Pulmonary:     Effort: Pulmonary effort is normal. No respiratory distress.     Breath sounds: Normal breath sounds. No wheezing, rhonchi or rales.  Skin:    General: Skin is warm and dry.  Neurological:     Mental Status: She is alert and oriented to person, place, and time.     Gait: Gait normal.     No results found for any visits on 01/12/24.    Assessment & Plan:  There are no diagnoses linked to this encounter.  No follow-ups on file.   Deeann Saint, MD

## 2024-01-15 NOTE — Telephone Encounter (Signed)
 yes

## 2024-01-18 ENCOUNTER — Encounter: Payer: Self-pay | Admitting: Obstetrics and Gynecology

## 2024-01-18 DIAGNOSIS — E041 Nontoxic single thyroid nodule: Secondary | ICD-10-CM

## 2024-01-18 DIAGNOSIS — E2839 Other primary ovarian failure: Secondary | ICD-10-CM

## 2024-01-18 DIAGNOSIS — Z01419 Encounter for gynecological examination (general) (routine) without abnormal findings: Secondary | ICD-10-CM

## 2024-01-18 NOTE — Telephone Encounter (Signed)
 [place] of her thyroid can be done @ [facility].  I can inquire where the pt would like to go for her DXA scan.  However, please advise on reason for mammo. Per investigation of EMR, her last screening was in 10/28/2023 and I do not see anything in her last OV about breast concerns. TIA.

## 2024-01-19 ENCOUNTER — Ambulatory Visit
Admission: RE | Admit: 2024-01-19 | Discharge: 2024-01-19 | Disposition: A | Discharge status: Home / Self Care | Payer: Managed Care, Other (non HMO) | Source: Ambulatory Visit | Attending: Obstetrics and Gynecology | Admitting: Obstetrics and Gynecology

## 2024-01-19 ENCOUNTER — Encounter: Payer: Self-pay | Admitting: Obstetrics and Gynecology

## 2024-01-19 DIAGNOSIS — Z01419 Encounter for gynecological examination (general) (routine) without abnormal findings: Secondary | ICD-10-CM

## 2024-01-19 DIAGNOSIS — E041 Nontoxic single thyroid nodule: Secondary | ICD-10-CM

## 2024-01-19 LAB — COMPREHENSIVE METABOLIC PANEL
ALT: 27 [IU]/L (ref 0–32)
AST: 19 [IU]/L (ref 0–40)
Albumin: 4.4 g/dL (ref 3.8–4.9)
Alkaline Phosphatase: 76 [IU]/L (ref 44–121)
BUN/Creatinine Ratio: 18 (ref 9–23)
BUN: 13 mg/dL (ref 6–24)
Bilirubin Total: <0.2 mg/dL (ref 0.0–1.2)
CO2: 24 mmol/L (ref 20–29)
Calcium: 9.5 mg/dL (ref 8.7–10.2)
Chloride: 101 mmol/L (ref 96–106)
Creatinine, Ser: 0.72 mg/dL (ref 0.57–1.00)
Globulin, Total: 2.7 g/dL (ref 1.5–4.5)
Glucose: 97 mg/dL (ref 70–99)
Potassium: 4.5 mmol/L (ref 3.5–5.2)
Sodium: 140 mmol/L (ref 134–144)
Total Protein: 7.1 g/dL (ref 6.0–8.5)
eGFR: 100 mL/min/{1.73_m2} (ref >59)

## 2024-01-19 LAB — LIPID PANEL
Chol/HDL Ratio: 3.2 {ratio} (ref 0.0–4.4)
Cholesterol, Total: 182 mg/dL (ref 100–199)
HDL: 57 mg/dL (ref >39)
LDL Chol Calc (NIH): 102 mg/dL — ABNORMAL HIGH (ref 0–99)
Triglycerides: 131 mg/dL (ref 0–149)
VLDL Cholesterol Cal: 23 mg/dL (ref 5–40)

## 2024-01-20 ENCOUNTER — Encounter: Payer: Self-pay | Admitting: Family Medicine

## 2024-01-20 NOTE — Telephone Encounter (Signed)
 Per EB:  "I don't see the final report but pictures look like there are a couple nodules.  Is there a way to get the final report of the thyroid US  Dr. Karma Greaser"   Refer to new encounter dated 01/19/2024. Encounter closed.

## 2024-01-20 NOTE — Telephone Encounter (Signed)
"  I don't see the final report but pictures look like there are a couple nodules.  Is there a way to get the final report of the thyroid US  Dr. Karma Greaser"  Spoke w/ [facility] Reception desk and inquired if pt's US Thyroid report could be expedited and she inquired as to why and I advised that we were made aware of late yesterday evening that the pt was recently diagnosed with bone cancer by the pt. She stated she will do her best to get it read asap.

## 2024-01-23 ENCOUNTER — Encounter: Payer: Self-pay | Admitting: Obstetrics and Gynecology

## 2024-01-26 ENCOUNTER — Encounter: Payer: Self-pay | Admitting: Obstetrics and Gynecology

## 2024-01-26 ENCOUNTER — Ambulatory Visit (HOSPITAL_BASED_OUTPATIENT_CLINIC_OR_DEPARTMENT_OTHER)
Admission: RE | Admit: 2024-01-26 | Discharge: 2024-01-26 | Disposition: A | Discharge status: Home / Self Care | Payer: Managed Care, Other (non HMO) | Source: Ambulatory Visit | Attending: Obstetrics and Gynecology | Admitting: Obstetrics and Gynecology

## 2024-01-26 DIAGNOSIS — Z01419 Encounter for gynecological examination (general) (routine) without abnormal findings: Secondary | ICD-10-CM | POA: Diagnosis present

## 2024-01-26 DIAGNOSIS — E2839 Other primary ovarian failure: Secondary | ICD-10-CM | POA: Insufficient documentation

## 2024-01-28 ENCOUNTER — Other Ambulatory Visit: Payer: Self-pay | Admitting: Family Medicine

## 2024-01-31 ENCOUNTER — Ambulatory Visit (INDEPENDENT_AMBULATORY_CARE_PROVIDER_SITE_OTHER): Payer: 59 | Admitting: Family Medicine

## 2024-01-31 ENCOUNTER — Encounter (INDEPENDENT_AMBULATORY_CARE_PROVIDER_SITE_OTHER): Payer: Self-pay | Admitting: Family Medicine

## 2024-01-31 DIAGNOSIS — Z7985 Long-term (current) use of injectable non-insulin antidiabetic drugs: Secondary | ICD-10-CM

## 2024-01-31 DIAGNOSIS — Z6836 Body mass index (BMI) 36.0-36.9, adult: Secondary | ICD-10-CM

## 2024-01-31 DIAGNOSIS — E119 Type 2 diabetes mellitus without complications: Secondary | ICD-10-CM | POA: Diagnosis not present

## 2024-01-31 DIAGNOSIS — E669 Obesity, unspecified: Secondary | ICD-10-CM | POA: Diagnosis not present

## 2024-01-31 DIAGNOSIS — M899 Disorder of bone, unspecified: Secondary | ICD-10-CM

## 2024-01-31 DIAGNOSIS — E1169 Type 2 diabetes mellitus with other specified complication: Secondary | ICD-10-CM

## 2024-01-31 MED ORDER — TIRZEPATIDE 15 MG/0.5ML ~~LOC~~ SOAJ: 15.0000 mg | SUBCUTANEOUS | 0 refills | Status: DC

## 2024-01-31 NOTE — Progress Notes (Signed)
 Office: [phone]  /  Fax: [phone]  WEIGHT SUMMARY AND BIOMETRICS  Anthropometric Measurements Height: 5\' 4"  (1.626 m) Weight: 214 lb (97.1 kg) BMI (Calculated): 36.72 Weight at Last Visit: 216 lb Weight Lost Since Last Visit: 2 lb Weight Gained Since Last Visit: 0 Starting Weight: 274 lb Total Weight Loss (lbs): 60 lb (27.2 kg) Peak Weight: 289 lb   Body Composition  Body Fat %: 47 % Fat Mass (lbs): 100.6 lbs Muscle Mass (lbs): 107.6 lbs Total Body Water (lbs): 79.6 lbs Visceral Fat Rating : 13   Other Clinical Data Fasting: no Labs: no Today's Visit #: 42 Starting Date: 08/07/21    Chief Complaint: OBESITY    History of Present Illness   The patient presents with obesity treatment and progress monitoring.  She is adhering to her category two eating plan approximately 80% of the time and has achieved a weight loss of two pounds over the past six weeks. She finds it challenging to avoid snacking while at home but maintains a high protein intake to feel satiated and prevent unnecessary snacking. Her diet includes high-protein meals, such as two eggs, sausage, and toast for lunch.  She has not been engaging in regular exercise due to quarantining in preparation for her upcoming surgery. However, she performs upper body exercises at home using two-pound weights, completing three sets of twelve repetitions. She recently took a one-mile walk but found the pavement hard on her knees. She notices a difference in her weight loss when she exercises and finds it frustrating not being able to visit the gym.  She is preparing for surgery scheduled for March 13th, involving excision, curating, and grafting of the left fibula. She is cautious to avoid illness before the surgery, as any sickness could delay the procedure by a month to a month and a half. She was previously hospitalized for three days due to RSV after a visit for blood pressure issues. She has been instructed  to stop certain medications, including D3, calcium, ibuprofen, and aspirin, seven days before her surgery. She is ensuring adequate hydration as advised by her general practitioner.  She is currently taking Mounjaro for type 2 diabetes and has not experienced any nausea or hypoglycemia.          PHYSICAL EXAM:  Blood pressure 126/72, pulse 66, temperature 98.1 F (36.7 C), height 5\' 4"  (1.626 m), weight 214 lb (97.1 kg), last menstrual period 04/25/2013, SpO2 98%. Body mass index is 36.73 kg/m.  DIAGNOSTIC DATA REVIEWED:  BMET    Component Value Date/Time   NA 140 01/18/2024 0952   K 4.5 01/18/2024 0952   CL 101 01/18/2024 0952   CO2 24 01/18/2024 0952   GLUCOSE 97 01/18/2024 0952   GLUCOSE 90 12/29/2023 0420   BUN 13 01/18/2024 0952   CREATININE 0.72 01/18/2024 0952   CREATININE 0.61 09/19/2020 0847   CALCIUM 9.5 01/18/2024 0952   GFRNONAA >60 12/29/2023 0420   GFRNONAA 106 09/19/2020 0847   GFRAA 123 09/19/2020 0847   Lab Results  Component Value Date   HGBA1C 5.2 07/13/2023   HGBA1C 6.1 (H) 10/27/2012   Lab Results  Component Value Date   INSULIN 41.2 (H) 07/14/2022   INSULIN 29.5 (H) 08/07/2021   Lab Results  Component Value Date   TSH 1.94 07/13/2023   CBC    Component Value Date/Time   WBC 10.6 (H) 12/29/2023 0420   RBC 4.32 12/29/2023 0420   HGB 13.3 12/29/2023 0420   HGB 13.2  08/07/2021 0000   HCT 38.5 12/29/2023 0420   HCT 40.2 08/07/2021 0000   PLT 245 12/29/2023 0420   PLT 284 08/07/2021 0000   MCV 89.1 12/29/2023 0420   MCV 89 08/07/2021 0000   MCH 30.8 12/29/2023 0420   MCHC 34.5 12/29/2023 0420   RDW 14.1 12/29/2023 0420   RDW 13.4 08/07/2021 0000   Iron Studies    Component Value Date/Time   IRON 54 10/06/2017 1505   TIBC 304 10/06/2017 1505   FERRITIN 151 (H) 10/06/2017 1505   IRONPCTSAT 18 10/06/2017 1505   Lipid Panel     Component Value Date/Time   CHOL 182 01/18/2024 0949   TRIG 131 01/18/2024 0949   HDL 57 01/18/2024  0949   CHOLHDL 3.2 01/18/2024 0949   CHOLHDL 6 07/13/2023 0941   VLDL 39.0 07/13/2023 0941   LDLCALC 102 (H) 01/18/2024 0949   LDLCALC 171 (H) 09/19/2020 0847   LDLDIRECT 145.0 07/18/2019 1410   Hepatic Function Panel     Component Value Date/Time   PROT 7.1 01/18/2024 0952   ALBUMIN 4.4 01/18/2024 0952   AST 19 01/18/2024 0952   ALT 27 01/18/2024 0952   ALKPHOS 76 01/18/2024 0952   BILITOT <0.2 01/18/2024 0952      Component Value Date/Time   TSH 1.94 07/13/2023 0941   Nutritional Lab Results  Component Value Date   VD25OH 78 12/09/2023   VD25OH 76.34 07/13/2023   VD25OH 64.0 07/14/2022     Assessment and Plan    Excision of left fibula lesion Scheduled for excision of a left fibula lesion on March 13th, evaluated for potential malignancy. Expected to ambulate within 24 hours post-surgery and likely to undergo home rehabilitation. Emphasized protein intake for healing and infection prevention, supported by studies. Advised extended fasting to reduce aspiration risk during anesthesia - Proceed with surgery on March 13th - Ensure adequate protein intake - Follow extended fasting guidelines - Verify surgery time and fasting instructions with surgical team once time is scheduled  Type 2 Diabetes Mellitus Prescribed Mounjaro for type 2 diabetes, with no hypoglycemic episodes reported. Managing condition well. Advised to verify [facility] administration timing with surgical team due to gastrointestinal effects increasing aspiration risk during anesthesia. - Continue Mounjaro - Verify Mounjaro administration timing with surgical team  Obesity Following category two eating plan 80% of the time, resulting in a two-pound weight loss over six weeks. Not exercising due to pre-surgery quarantine. Emphasized protein intake to aid weight loss and prevent snacking. Engaging in upper body exercises with two-pound weights and walking, though experiencing knee impact from pavement.  Highlighted importance of muscle challenge without damage and discussed antioxidants from deep-colored fruits and vegetables. - Continue category two eating plan - Maintain high protein intake - Incorporate upper body exercises - Include antioxidant-rich foods  Follow-up  - Schedule follow-up appointment in four weeks        She was informed of the importance of frequent follow up visits to maximize her success with intensive lifestyle modifications for her multiple health conditions.    Quillian Quince, MD

## 2024-02-02 HISTORY — PX: SOFT TISSUE BIOPSY: SHX1053

## 2024-02-02 LAB — LAB REPORT - SCANNED: EGFR: 107

## 2024-02-09 ENCOUNTER — Other Ambulatory Visit: Payer: Self-pay | Admitting: Family Medicine

## 2024-02-09 DIAGNOSIS — I10 Essential (primary) hypertension: Secondary | ICD-10-CM

## 2024-02-16 ENCOUNTER — Ambulatory Visit: Payer: 59 | Admitting: Adult Health

## 2024-02-17 ENCOUNTER — Encounter: Payer: Self-pay | Admitting: Family Medicine

## 2024-02-17 ENCOUNTER — Ambulatory Visit (INDEPENDENT_AMBULATORY_CARE_PROVIDER_SITE_OTHER): Payer: Managed Care, Other (non HMO) | Admitting: Family Medicine

## 2024-02-17 VITALS — BP 124/76 | HR 61 | Temp 98.7°F | Resp 20 | Ht 64.0 in | Wt 219.5 lb

## 2024-02-17 DIAGNOSIS — E66812 Obesity, class 2: Secondary | ICD-10-CM

## 2024-02-17 DIAGNOSIS — M899 Disorder of bone, unspecified: Secondary | ICD-10-CM | POA: Diagnosis not present

## 2024-02-17 DIAGNOSIS — E669 Obesity, unspecified: Secondary | ICD-10-CM

## 2024-02-17 DIAGNOSIS — E871 Hypo-osmolality and hyponatremia: Secondary | ICD-10-CM | POA: Diagnosis not present

## 2024-02-17 DIAGNOSIS — Z6837 Body mass index (BMI) 37.0-37.9, adult: Secondary | ICD-10-CM

## 2024-02-17 DIAGNOSIS — I10 Essential (primary) hypertension: Secondary | ICD-10-CM

## 2024-02-17 DIAGNOSIS — E1169 Type 2 diabetes mellitus with other specified complication: Secondary | ICD-10-CM | POA: Diagnosis not present

## 2024-02-17 DIAGNOSIS — Z7985 Long-term (current) use of injectable non-insulin antidiabetic drugs: Secondary | ICD-10-CM

## 2024-02-17 LAB — POCT GLYCOSYLATED HEMOGLOBIN (HGB A1C): Hemoglobin A1C: 5 % (ref 4.0–5.6)

## 2024-02-17 NOTE — Progress Notes (Signed)
 Established Patient Office Visit   Subjective  Patient ID: Gloria [name], female    DOB: [birth year]  Age: 53 y.o. MRN: [id number]  Chief Complaint  Patient presents with   Medical Management of Chronic Issues    Follow up     Pt is a 53 year old female who presents for f/u.  Pt with persistent swelling and soreness in R lower leg s/p surgical procedure to remove a bone lesion. The area is described as 'angry,' and pathology results were inconclusive.  She has returned to work on a part-time basis, working four hours a day for the next month.  Her blood pressure has improved with medication. She was hydrating before surgery, which may have affected her sodium levels.  She has gained five pounds since her surgery on March 13th, attributing it to a lack of exercise. She has not been able to exercise since the surgery but plans to start with light activities such as leg lifts, calf raises, and using a recumbent elliptical at the gym.  Pt requesting allergy to semaglutide be removed from med list as it is always brought up since she is on mounjaro.  Pt had an intolerance to Rybelsus (name brand was not listed as an option on allergy section at time entered) which caused increased depression and fibromyalgia flare.  Since being on [facility] patient has not experienced the symptoms.  She mentions a past thyroid ultrasound that showed no nodules and a normal bone density test from a couple of years ago. She also notes a history of heart evaluation with normal results.     Patient Active Problem List   Diagnosis Date Noted   Type 2 diabetes mellitus with obesity (HCC) 01/31/2024   Bone lesion 01/12/2024   Non-obstructive CAD 12/30/2023   Unstable angina (HCC) 12/28/2023   Drug-induced weight loss 10/24/2023   Chronic kidney disease    Class 2 obesity due to excess calories with body mass index (BMI) of 38.0 to 38.9 in adult 07/19/2023   Hyponatremia 07/09/2023   BMI 38.0-38.9,adult  05/24/2023   Chronic pain of left ankle 02/26/2023   Other osteoarthritis of spine 01/04/2023   BMI 40.0-44.9, adult (HCC) 01/04/2023   Obesity, Beginning BMI 45.60 01/04/2023   Nasal turbinate hypertrophy 12/10/2022   Nasal vestibulitis 12/10/2022   Low back pain 10/07/2022   Pre-hypertension 08/05/2022   Mixed hyperlipidemia 07/14/2022   Stress 06/09/2022   Essential hypertension 05/03/2022   At risk for heart disease 05/03/2022   Vitamin D deficiency 04/07/2022   At risk for hypoglycemia 04/07/2022   Acquired cavovarus deformity of right foot 07/08/2021   Rupture of peroneal tendon 07/08/2021   Pain of left hip joint 05/22/2021   Lumbar radiculopathy 03/16/2021   Non-restorative sleep 10/25/2020   Gasping for breath 09/23/2020   Class 3 severe obesity with serious comorbidity and body mass index (BMI) of 45.0 to 49.9 in adult [facility]) 09/23/2020   Recurrent hypersomnia 09/23/2020   MDD (recurrent major depressive disorder) in remission (HCC) 09/23/2020   Sacroiliac joint pain 06/28/2020   Diabetes mellitus (HCC) 04/06/2019   Degeneration of lumbar intervertebral disc 05/31/2018   Nausea 06/03/2016   Prediabetes 05/13/2016   Migraine without status migrainosus, not intractable 03/30/2016   Bilateral lower extremity edema 03/03/2016   Allergic rhinitis 01/16/2016   Arthralgia 01/16/2016   Myalgia 01/16/2016   H/O metrorrhagia 01/16/2016   Disease of nasal cavity and sinuses 01/16/2016   Diarrhea 01/16/2016   Cafe au lait spots  01/16/2016   Constipation 01/16/2016   IBS (irritable bowel syndrome) 01/16/2016   Sacroiliac joint dysfunction 01/16/2016   OSA on CPAP 02/25/2015   Chronic back pain 02/25/2015   Periodic limb movement disorder 02/25/2014   Restless legs syndrome 12/15/2013   Cardiac murmur 10/16/2013   Snoring 10/16/2013   Hx of migraine headaches 10/16/2013   Fluid retention 10/12/2013   Weight gain 10/12/2013   Anxiety 10/12/2013   Alcoholic liver disease  (HCC) 95/62/1308   Ex-cigarette smoker 10/28/2012   Fibromyalgia 10/27/2012   Depression with anxiety 10/27/2012   GERD (gastroesophageal reflux disease) 10/27/2012   H/O ETOH abuse 10/27/2012   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   DDD (degenerative disc disease), lumbar    Depression    Diabetes (HCC)    Diverticulosis    Drug use    Elevated LFTs    Fibromyalgia    GERD (gastroesophageal reflux disease)    History of ETOH abuse    Hyperlipidemia    Hyperplastic colon polyp    Hyponatremia    in the past   Internal hemorrhoids    Migraines    NAFLD (nonalcoholic fatty liver disease)    Obese    Obstructive sleep apnea 02/25/2015   not diagnosed per pt 04-03-20   Osteoarthritis    lower back- DDD   Periodic limb movement disorder 02/25/2014   Pre-diabetes    pt taking metformin   RLS (restless legs syndrome)    Sleep apnea    Smoker    Snoring    Substance abuse (HCC)    Swallowing difficulty    Vitamin B12 deficiency    Vitamin D deficiency    Past Surgical History:  Procedure Laterality Date   CERVICAL FUSION     COLONOSCOPY     CYSTOSCOPY  06/06/2013   Procedure: CYSTOSCOPY;  Surgeon: Ok Edwards, MD;  Location: [facility];  Service: Gynecology;;   EXCISION METACARPAL MASS Right 09/03/2021   Procedure: EXCISION METACARPAL MASS RIGHT SMALL FINGER;  Surgeon: Cindee Salt, MD;  Location: [facility];  Service: Orthopedics;  Laterality: Right;   INTRAUTERINE DEVICE INSERTION  08/09/2008   PARAGUARD- removed   kidney surgery  1995   abcess   LAPAROSCOPY N/A 06/06/2013   Procedure: LAPAROSCOPY DIAGNOSTIC;  Surgeon: Ok Edwards, MD;  Location: [facility];  Service: Gynecology;  Laterality: N/A;   LAPAROTOMY  06/06/2013   Procedure: EXPLORATORY LAPAROTOMY;  Surgeon: Ok Edwards, MD;  Location: [facility];  Service: Gynecology;;   LEFT HEART CATH AND CORONARY ANGIOGRAPHY N/A 12/29/2023   Procedure: LEFT HEART CATH AND CORONARY ANGIOGRAPHY;  Surgeon: [place], Peter M, MD;  Location: [facility];  Service: Cardiovascular;  Laterality: N/A;   LIVER BIOPSY     partial right kideny  Right    radial tunnel Bilateral 2005   VAGINAL HYSTERECTOMY Left 06/06/2013   Procedure: Vaginal Hysterectomy with Patiial Right Salpingectomy;  Surgeon: Ok Edwards, MD;  Location: [facility];  Service: Gynecology;  Laterality: Left;  Social History   Tobacco Use   Smoking status: Former    Current packs/day: 0.00    Types: Cigarettes    Quit date: 01/26/2012    Years since quitting: 12.0   Smokeless tobacco: Never  Vaping Use   Vaping status: Never Used  Substance Use Topics   Alcohol use: No    Alcohol/week: 0.0 standard drinks of alcohol    Comment: History of alcohol abuse   Drug use: No   Family History  Problem Relation Age of Onset   Cancer Mother    Breast cancer Mother    Depression Mother    Anxiety disorder Mother    Obesity Mother    Sleep apnea Mother    Obesity Father    Anxiety disorder Father    Cancer Father    Hyperlipidemia Father    Colon polyps Father    Diabetes Father    Cirrhosis Father    Liver cancer Father    Liver disease Father    Alcoholism Father    Depression Father    Alcohol abuse Father    Sleep apnea Father    Irritable bowel syndrome Sister    Sleep apnea Brother    Breast cancer Maternal Aunt    Alcoholism Maternal Aunt    Alcoholism Maternal Grandmother    Alcoholism Maternal Grandfather    Breast cancer Paternal Grandmother    Colon cancer Paternal Grandmother    Esophageal cancer Paternal Grandfather    Stomach cancer Paternal Grandfather    Alcoholism  Paternal Grandfather    Rectal cancer Neg Hx    Allergies  Allergen Reactions   Benadryl [Diphenhydramine Hcl] Hives   Metformin     Other Reaction(s): headache   Ropinirole Other (See Comments)    Unknown rxn.   Levofloxacin Other (See Comments)    Unknown rxn   Codeine     Unknown rxn   Doxycycline     Unknown rxn   Lisinopril-Hydrochlorothiazide Other (See Comments)    Dry cough, dry eyes, insomnia, emotionally liable, increased thirst, brain fog, fatigue.   Metformin And Related     Blurred vision   Nuvigil [Armodafinil]     migraines    Olmesartan Other (See Comments)    May have contributed to hyponatremia.  Baseline sodium 131-133.   Requip [Ropinirole Hcl]     Increased headache   Tramadol     Other reaction(s): Other (See Comments)   Wellbutrin [Bupropion]     Unknown rxn.   Zolpidem     Unknown rxn.   Semaglutide Other (See Comments)    Rybelsus caused depression and fibromyalgia flair.      ROS Negative unless stated above    Objective:     BP 124/76   Pulse 61   Temp 98.7 F (37.1 C) (Oral)   Resp 20   Ht 5\' 4"  (1.626 m)   Wt 219 lb 8 oz (99.6 kg)   LMP 04/25/2013   SpO2 98%   BMI 37.68 kg/m  BP Readings from Last 3 Encounters:  02/17/24 124/76  01/31/24 126/72  01/12/24 (!) 162/90   Wt Readings from Last 3 Encounters:  02/17/24 219 lb 8 oz (99.6 kg)  01/31/24 214 lb (97.1 kg)  01/12/24 219 lb 3.2 oz (99.4 kg)     Physical Exam Constitutional:      General: She is not in acute distress.    Appearance: Normal appearance.  HENT:     Head: Normocephalic and atraumatic.  Nose: Nose normal.     Mouth/Throat:     Mouth: Mucous membranes are moist.  Cardiovascular:     Rate and Rhythm: Normal rate and regular rhythm.     Heart sounds: Normal heart sounds. No murmur heard.    No gallop.  Pulmonary:     Effort: Pulmonary effort is normal. No respiratory distress.     Breath sounds: Normal breath sounds. No wheezing, rhonchi or  rales.  Musculoskeletal:     Left lower leg: Normal.  Skin:    General: Skin is warm and dry.     Comments: RLE with healing surgical incision and discoloration.  Neurological:     Mental Status: She is alert and oriented to person, place, and time.      Results for orders placed or performed in visit on 02/17/24  POC HgB A1c  Result Value Ref Range   Hemoglobin A1C 5.0 4.0 - 5.6 %   HbA1c POC (<> result, manual entry)     HbA1c, POC (prediabetic range)     HbA1c, POC (controlled diabetic range)        Assessment & Plan:  Type 2 diabetes mellitus with obesity (HCC) -     POCT glycosylated hemoglobin (Hb A1C)  Hyponatremia -     Basic metabolic panel with GFR; Future  Bone lesion -     Basic metabolic panel with GFR; Future  Class 2 severe obesity with serious comorbidity and body mass index (BMI) of 37.0 to 37.9 in adult, unspecified obesity type (HCC) -     Basic metabolic panel with GFR; Future  Essential hypertension  Bone Lesion   Pathology results are inconclusive, with both benign and malignant possibilities considered. Imaging is scheduled to assess for metastasis. A bone scan and a CT scan of the lungs are planned for April 3.  Part-time work is recommended for one month to aid recovery. Light exercises such as leg lifts and calf raises are advised. Consider FMLA for reduced work hours if necessary.  Hyponatremia   Sodium level is 130, possibly due to pre-surgical hydration. Sodium levels will be checked today, and a follow-up with a nephrologist is scheduled for May 6.  Hypertension   Her blood pressure is stable and well-managed with current medication.  Weight Gain   She has gained 5 pounds post-surgery due to reduced activity. Plans to resume exercise include light activities such as using a recumbent elliptical and chair exercises.  Return in about 3 months (around 05/19/2024), or if symptoms worsen or fail to improve.   Deeann Saint, MD

## 2024-02-18 LAB — BASIC METABOLIC PANEL WITH GFR
BUN: 13 mg/dL (ref 7–25)
CO2: 27 mmol/L (ref 20–32)
Calcium: 8.9 mg/dL (ref 8.6–10.4)
Chloride: 99 mmol/L (ref 98–110)
Creat: 0.66 mg/dL (ref 0.50–1.03)
Glucose, Bld: 88 mg/dL (ref 65–99)
Potassium: 4.7 mmol/L (ref 3.5–5.3)
Sodium: 133 mmol/L — ABNORMAL LOW (ref 135–146)
eGFR: 104 mL/min/{1.73_m2} (ref >=60)

## 2024-02-27 ENCOUNTER — Encounter: Payer: Self-pay | Admitting: Family Medicine

## 2024-03-01 ENCOUNTER — Ambulatory Visit
Payer: Managed Care, Other (non HMO) | Attending: Cardiovascular Disease | Admitting: Pharmacist Clinician (PhC)/ Clinical Pharmacy Specialist

## 2024-03-01 ENCOUNTER — Other Ambulatory Visit: Payer: Self-pay | Admitting: Pharmacist Clinician (PhC)/ Clinical Pharmacy Specialist

## 2024-03-01 ENCOUNTER — Encounter: Payer: Self-pay | Admitting: Pharmacist Clinician (PhC)/ Clinical Pharmacy Specialist

## 2024-03-01 DIAGNOSIS — E782 Mixed hyperlipidemia: Secondary | ICD-10-CM

## 2024-03-01 NOTE — Patient Instructions (Signed)
 Your Results:             Your most recent labs Goal  Total Cholesterol 182 < 200  Triglycerides 131 < 150  HDL (happy/good cholesterol) 57 > 40  LDL (lousy/bad cholesterol 102 < 55   Medication changes:  We will start the process to get Leqvio covered by your insurance.  Once the prior authorization is complete, I will call/send a MyChart message to let you know and confirm pharmacy information.   The first two doses are 3 months apart, then every 6 months after that.    Lab orders:  We want to repeat labs after 4 months.  We will send you a lab order to remind you once we get closer to that time.     Thank you for choosing [facility]

## 2024-03-01 NOTE — Progress Notes (Signed)
 Office Visit    Patient Name: Gloria [name] Date of Encounter: 03/01/2024  Primary Care Provider:  Deeann Saint, MD Primary Cardiologist:  Donato Schultz, MD  Chief Complaint    Hyperlipidemia   Significant Past Medical History   CAD CAC = 61.4 (90th percentile)  HTN On carvedilol and amlodipine  DM2 8/24 A1c 5.2 - well controlled on Mounjaro  Unstable angina 12/29/23 cath - 35% pLAD, 15% pRCA stenoses.       Allergies  Allergen Reactions   Benadryl [Diphenhydramine Hcl] Hives   Metformin     Other Reaction(s): headache   Ropinirole Other (See Comments)    Unknown rxn.   Levofloxacin Other (See Comments)    Unknown rxn   Codeine     Unknown rxn   Doxycycline     Unknown rxn   Lisinopril-Hydrochlorothiazide Other (See Comments)    Dry cough, dry eyes, insomnia, emotionally liable, increased thirst, brain fog, fatigue.   Metformin And Related     Blurred vision   Nuvigil [Armodafinil]     migraines    Olmesartan Other (See Comments)    May have contributed to hyponatremia.  Baseline sodium 131-133.   Requip [Ropinirole Hcl]     Increased headache   Tramadol     Other reaction(s): Other (See Comments)   Wellbutrin [Bupropion]     Unknown rxn.   Zolpidem     Unknown rxn.    History of Present Illness    Gloria [name] is a 53 y.o. female patient of Dr Tresa Endo, in the office today to discuss options for cholesterol management.  LDL has been as high as 176 in the past, most recently at 102 on rosuvastatin 20 mg.  Lp(a) was 165.2.   She has been doing well with her DM, and on Mounjaro her A1c has dropped from around 7 to 5.2.  Has also been working with [facility] [employer] [facility], and over the past 2.5 years has dropped 60 pounds.    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  Father deceased.  Mother has aortic stenosis, now 34; son  28 healthy  Social Hx: Tobacco: former - quit 2013 Alcohol: none   Diet:   mix of home and eating out; works with [employer], so mostly 75% protein with some carb/fruit, or salads;  no fast foods.  2 cookies per day (oatmeal raisin or walnut), high protein yogurt, cashews  Exercise: 2-3 days at [facility]; focusing upper body now,  usually 45 cardio, working back up to that   Accessory Clinical Findings   Lab Results  Component Value Date   CHOL 182 01/18/2024   HDL 57 01/18/2024   LDLCALC 102 (H) 01/18/2024   LDLDIRECT 145.0 07/18/2019   TRIG 131 01/18/2024   CHOLHDL 3.2 01/18/2024    Lipoprotein (a)  Date/Time Value Ref Range Status  12/26/2023 10:54 AM 165.2 (H) <75.0 nmol/L Final    Comment:    Note:  Values greater than or equal to 75.0 nmol/L may        indicate an independent risk factor for CHD,        but must be evaluated with caution when applied        to non-Caucasian populations due to the        influence of genetic factors on  Lp(a) across        ethnicities.     Lab Results  Component Value Date   ALT 27 01/18/2024   AST 19 01/18/2024   ALKPHOS 76 01/18/2024   BILITOT <0.2 01/18/2024   Lab Results  Component Value Date   CREATININE 0.66 02/17/2024   BUN 13 02/17/2024   NA 133 (L) 02/17/2024   K 4.7 02/17/2024   CL 99 02/17/2024   CO2 27 02/17/2024   Lab Results  Component Value Date   HGBA1C 5.0 02/17/2024    Home Medications    Current Outpatient Medications  Medication Sig Dispense Refill   albuterol (VENTOLIN HFA) 108 (90 Base) MCG/ACT inhaler Inhale 2 puffs into the lungs every 4 (four) hours as needed for wheezing or shortness of breath.     amLODipine (NORVASC) 5 MG tablet TAKE 1 TABLET(5 MG) BY MOUTH DAILY 30 tablet 2   aspirin EC 81 MG tablet Take 1 tablet (81 mg total) by mouth daily. Swallow whole. 150 tablet 2   azelastine (ASTELIN) 0.1 % nasal spray Place 2 sprays into both nostrils 2 (two) times daily. 30 mL 12   B Complex Vitamins (B  COMPLEX PO) Take 1 tablet by mouth daily.     blood glucose meter kit and supplies KIT Dispense based on patient and insurance preference. Use up to four times daily as directed. (FOR ICD-9 250.00, 250.01). 1 each 0   Blood Glucose Monitoring Suppl (ONETOUCH VERIO FLEX SYSTEM) w/Device KIT Use to check Blood sugars twice daily before meals 1 kit 0   Brexpiprazole (REXULTI) 4 MG TABS Take 4 mg by mouth daily.     Calcium Carbonate-Vitamin D 600-400 MG-UNIT tablet Take 1 tablet by mouth daily.     carvedilol (COREG) 6.25 MG tablet TAKE 1 TABLET(6.25 MG) BY MOUTH TWICE DAILY WITH A MEAL 180 tablet 1   cetirizine (ZYRTEC) 10 MG tablet Take 10 mg by mouth daily.     Coenzyme Q10 (COQ10) 100 MG CAPS Take 100 mg by mouth daily.     D3-50 1.25 MG (50000 UT) capsule Take 50,000 Units by mouth once a week.     estradiol (VIVELLE-DOT) 0.05 MG/24HR patch Place 1 patch (0.05 mg total) onto the skin 2 (two) times a week. 8 patch 12   fluticasone (CUTIVATE) 0.05 % cream Apply 1 Application topically 2 (two) times daily as needed (Behind ear/Behind belly flap).     fluticasone (FLONASE) 50 MCG/ACT nasal spray Place 2 sprays into both nostrils daily.     glucose blood (CONTOUR NEXT TEST) test strip Use to test blood glucose twice daily 100 each 5   hydrOXYzine (ATARAX) 10 MG tablet Take 20 mg by mouth daily as needed for anxiety.     ibuprofen (ADVIL) 800 MG tablet TAKE 1 TABLET(800 MG) BY MOUTH EVERY 8 HOURS AS NEEDED FOR MODERATE PAIN 30 tablet 3   Lancets (ONETOUCH DELICA PLUS LANCET30G) MISC USE 1  TO CHECK GLUCOSE 4 TIMES DAILY 100 each 0   Magnesium 500 MG CAPS Take 500 mg by mouth at bedtime.     Multiple Vitamins-Minerals (MULTIVITAMIN PO) Take 1 tablet by mouth daily.      nitroGLYCERIN (NITROSTAT) 0.4 MG SL tablet Place 1 tablet (0.4 mg total) under the tongue every 5 (five) minutes as needed for chest pain. 25 tablet 3   OMEGA 3-6-9 FATTY ACIDS PO Take 500 mg by mouth daily.     oxcarbazepine  (TRILEPTAL) 600 MG tablet Take 600  mg by mouth 2 (two) times daily.     rizatriptan (MAXALT) 10 MG tablet TAKE 1 TABLET BY MOUTH AS NEEDED FOR MIGRAINE 10 tablet 2   rosuvastatin (CRESTOR) 20 MG tablet Take 1 tablet (20 mg total) by mouth daily. 90 tablet 3   thiamine (VITAMIN B-1) 50 MG tablet Take 50 mg by mouth daily.     tirzepatide (MOUNJARO) 15 MG/0.5ML Pen Inject 15 mg into the skin once a week. 6 mL 0   vitamin C (ASCORBIC ACID) 500 MG tablet Take 500 mg by mouth daily.     VITAMIN E PO Take 900 mg by mouth daily.     No current facility-administered medications for this visit.     Assessment & Plan    Mixed hyperlipidemia Assessment: Patient with ASCVD not at LDL goal of < 55 Most recent LDL 102 on 01/18/24 Has been compliant with high intensity statin: rosuvastatin 20  Reviewed options for lowering LDL cholesterol, including ezetimibe, PCSK-9 inhibitors, bempedoic acid and inclisiran.  Discussed mechanisms of action, dosing, side effects, potential decreases in LDL cholesterol and costs.  Also reviewed potential options for patient assistance.  Plan: Patient agreeable to starting Leqvio Repeat labs after:  4 months (1 month after second dose) Lipid Liver function    Phillips Hay, PharmD CPP [facility] [address]  [place], [place] [address] [phone]  03/01/2024, 3:55 PM

## 2024-03-01 NOTE — Progress Notes (Signed)
 Please note, patient has allergy to diphenhydramine

## 2024-03-01 NOTE — Assessment & Plan Note (Signed)
 Assessment: Patient with ASCVD not at LDL goal of < 55 Most recent LDL 102 on 01/18/24 Has been compliant with high intensity statin: rosuvastatin 20  Reviewed options for lowering LDL cholesterol, including ezetimibe, PCSK-9 inhibitors, bempedoic acid and inclisiran.  Discussed mechanisms of action, dosing, side effects, potential decreases in LDL cholesterol and costs.  Also reviewed potential options for patient assistance.  Plan: Patient agreeable to starting Leqvio Repeat labs after:  4 months (1 month after second dose) Lipid Liver function

## 2024-03-11 ENCOUNTER — Encounter (INDEPENDENT_AMBULATORY_CARE_PROVIDER_SITE_OTHER): Payer: Self-pay | Admitting: *Deleted

## 2024-03-11 DIAGNOSIS — E1169 Type 2 diabetes mellitus with other specified complication: Secondary | ICD-10-CM

## 2024-03-12 ENCOUNTER — Telehealth: Payer: Self-pay

## 2024-03-12 ENCOUNTER — Ambulatory Visit (INDEPENDENT_AMBULATORY_CARE_PROVIDER_SITE_OTHER): Admitting: Family Medicine

## 2024-03-12 MED ORDER — EZETIMIBE 10 MG PO TABS: 10.0000 mg | ORAL_TABLET | Freq: Every day | ORAL | 3 refills | Status: AC

## 2024-03-12 MED ORDER — TIRZEPATIDE 15 MG/0.5ML ~~LOC~~ SOAJ: 15.0000 mg | SUBCUTANEOUS | 0 refills | Status: DC

## 2024-03-12 NOTE — Telephone Encounter (Signed)
 Pt called to see if her prescription for Mounjaro  can be refilled due to Dr. Alvia Awkward being out today and not having any availability before her next appt on 5/19. Please follow up with patient.

## 2024-03-12 NOTE — Addendum Note (Signed)
 Addended by: Jennett Tarbell L on: 03/12/2024 08:41 AM   Modules accepted: Orders

## 2024-03-12 NOTE — Telephone Encounter (Signed)
 Dr. Renna Cary and Kristin, please see denial reason. There is a lot more denial info on the denial letter, in the media tab.  Auth Submission: DENIED Site of care: Site of care: [facility] Payer: [employer] commercial Medication & CPT/J Code(s) submitted: Leqvio (Inclisiran) [phone] Route of submission (phone, fax, portal): fax Phone # Fax # [phone]   Authorization has been DENIED because the patient has not met all of the following criteria.... A. Patient has tried on high-intensity statin therapy and Patient has tried one high-intensity statin above along with ezetimibe  (as a single-entity or as a combination product) for at least 8 continuous weeks; and  Low- density lipoprotein cholesterol level after this treatment regimen remains at 70mg /dl  See attached denial letter for more info on denial reason.

## 2024-03-12 NOTE — Addendum Note (Signed)
 Addended by: Kysean Sweet D on: 03/12/2024 08:36 AM   Modules accepted: Orders

## 2024-03-28 LAB — LAB REPORT - SCANNED: EGFR: 106

## 2024-04-09 ENCOUNTER — Ambulatory Visit (INDEPENDENT_AMBULATORY_CARE_PROVIDER_SITE_OTHER): Admitting: Family Medicine

## 2024-04-09 ENCOUNTER — Encounter (INDEPENDENT_AMBULATORY_CARE_PROVIDER_SITE_OTHER): Payer: Self-pay | Admitting: Family Medicine

## 2024-04-09 VITALS — BP 139/85 | HR 67 | Temp 99.0°F | Ht 64.5 in | Wt 211.0 lb

## 2024-04-09 DIAGNOSIS — E1169 Type 2 diabetes mellitus with other specified complication: Secondary | ICD-10-CM

## 2024-04-09 DIAGNOSIS — Z6835 Body mass index (BMI) 35.0-35.9, adult: Secondary | ICD-10-CM | POA: Diagnosis not present

## 2024-04-09 DIAGNOSIS — Z7985 Long-term (current) use of injectable non-insulin antidiabetic drugs: Secondary | ICD-10-CM

## 2024-04-09 DIAGNOSIS — E669 Obesity, unspecified: Secondary | ICD-10-CM

## 2024-04-09 DIAGNOSIS — F439 Reaction to severe stress, unspecified: Secondary | ICD-10-CM

## 2024-04-09 DIAGNOSIS — E119 Type 2 diabetes mellitus without complications: Secondary | ICD-10-CM | POA: Diagnosis not present

## 2024-04-09 DIAGNOSIS — E66813 Obesity, class 3: Secondary | ICD-10-CM

## 2024-04-09 MED ORDER — TIRZEPATIDE 15 MG/0.5ML ~~LOC~~ SOAJ: 15.0000 mg | SUBCUTANEOUS | 0 refills | Status: DC

## 2024-04-09 NOTE — Progress Notes (Signed)
 Office: [phone]  /  Fax: [phone]  WEIGHT SUMMARY AND BIOMETRICS  Anthropometric Measurements Height: 5' 4.5" (1.638 m) (rechecked height today) Weight: 211 lb (95.7 kg) BMI (Calculated): 35.67 Weight at Last Visit: 214 lb Weight Lost Since Last Visit: 3 lb Weight Gained Since Last Visit: 0 Starting Weight: 274 lb Total Weight Loss (lbs): 63 lb (28.6 kg) Peak Weight: 289 lb   Body Composition  Body Fat %: 47.5 % Fat Mass (lbs): 100.4 lbs Muscle Mass (lbs): 105.2 lbs Total Body Water (lbs): 80 lbs Visceral Fat Rating : 13   Other Clinical Data Fasting: no Labs: no Today's Visit #: 81 Starting Date: 08/07/21 Comments: rechecked height today    Chief Complaint: OBESITY    History of Present Illness Gloria [name] is a 53 year old female with obesity and type 2 diabetes who presents for obesity treatment plan discussion and diabetes management.  She has been adhering to her category 2 eating plan approximately 85% of the time and has incorporated exercise into her routine, performing 45 minutes of exercise twice a week. She has lost three pounds over the past two months. Her eating habits have been inconsistent due to stress, often skipping meals or feeling nauseous and not hungry. Despite these challenges, she continues to maintain her weight loss efforts.  She has a history of type 2 diabetes and is taking Mounjaro , which she tolerates well. She requests a refill during the visit. She manages her diabetes through diet and exercise.  She recently underwent a surgical procedure due to a bone anomaly initially suspected to be cancerous. The pathology results were inconclusive, and a subsequent nuclear medicine bone scan showed no additional abnormalities, though some arthritis was noted. A CT scan of the chest was also performed and returned normal results. She has been cleared to resume all activities at the gym and has returned to her exercise routine,  including treadmill, elliptical, and weight training.  She reports significant stress related to her work environment, which has been affecting her eating and sleep patterns. 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 This stress has impacted her ability to eat regularly and has contributed to her feeling 'almost nauseous' at times. She also reports difficulty sleeping, with frequent awakenings and an inability to return to deep sleep. She is not taking any medication to aid sleep, citing an allergy to Benadryl  and a preference to avoid medications due to her recovery from addiction.      PHYSICAL EXAM:  Blood pressure 139/85, pulse 67, temperature 99 F (37.2 C), height 5' 4.5" (1.638 m), weight 211 lb (95.7 kg), last menstrual period 04/25/2013, SpO2 99%. Body mass index is 35.66 kg/m.  DIAGNOSTIC DATA REVIEWED:  BMET    Component Value Date/Time   NA 133 (L) 02/17/2024 1511   NA 140 01/18/2024 0952   K 4.7 02/17/2024 1511   CL 99 02/17/2024 1511   CO2 27 02/17/2024 1511   GLUCOSE 88 02/17/2024 1511   BUN 13 02/17/2024 1511   BUN 13 01/18/2024 0952   CREATININE 0.66 02/17/2024 1511   CALCIUM  8.9 02/17/2024 1511   GFRNONAA >60 12/29/2023 0420   GFRNONAA 106 09/19/2020 0847   GFRAA 123 09/19/2020 0847   Lab Results  Component Value Date   HGBA1C 5.0 02/17/2024   HGBA1C 6.1 (H) 10/27/2012   Lab Results  Component Value Date   INSULIN  41.2 (H) 07/14/2022  INSULIN  29.5 (H) 08/07/2021   Lab Results  Component Value Date   TSH 1.94 07/13/2023   CBC    Component Value Date/Time   WBC 10.6 (H) 12/29/2023 0420   RBC 4.32 12/29/2023 0420   HGB 13.3 12/29/2023 0420   HGB 13.2 08/07/2021 0000   HCT 38.5 12/29/2023 0420   HCT 40.2 08/07/2021 0000   PLT 245 12/29/2023 0420   PLT 284 08/07/2021 0000   MCV 89.1 12/29/2023 0420   MCV 89 08/07/2021 0000   MCH 30.8 12/29/2023  0420   MCHC 34.5 12/29/2023 0420   RDW 14.1 12/29/2023 0420   RDW 13.4 08/07/2021 0000   Iron  Studies    Component Value Date/Time   IRON  54 10/06/2017 1505   TIBC 304 10/06/2017 1505   FERRITIN 151 (H) 10/06/2017 1505   IRONPCTSAT 18 10/06/2017 1505   Lipid Panel     Component Value Date/Time   CHOL 182 01/18/2024 0949   TRIG 131 01/18/2024 0949   HDL 57 01/18/2024 0949   CHOLHDL 3.2 01/18/2024 0949   CHOLHDL 6 07/13/2023 0941   VLDL 39.0 07/13/2023 0941   LDLCALC 102 (H) 01/18/2024 0949   LDLCALC 171 (H) 09/19/2020 0847   LDLDIRECT 145.0 07/18/2019 1410   Hepatic Function Panel     Component Value Date/Time   PROT 7.1 01/18/2024 0952   ALBUMIN 4.4 01/18/2024 0952   AST 19 01/18/2024 0952   ALT 27 01/18/2024 0952   ALKPHOS 76 01/18/2024 0952   BILITOT <0.2 01/18/2024 0952      Component Value Date/Time   TSH 1.94 07/13/2023 0941   Nutritional Lab Results  Component Value Date   VD25OH 78 12/09/2023   VD25OH 76.34 07/13/2023   VD25OH 64.0 07/14/2022     Assessment and Plan Assessment & Plan Obesity Obesity management is ongoing with a three-pound weight loss over the last two months. She adheres to her category 2 eating plan 85% of the time and exercises 45 minutes twice weekly. Work-related stress affects her eating habits, causing irregular meal patterns and occasional nausea. Despite recent surgery and stress, she maintains muscle mass. Emphasized the importance of not skipping meals and maintaining a balanced diet. - Continue category 2 eating plan - Encourage regular exercise, including treadmill, elliptical, and weights - Advise against skipping meals - Monitor weight and muscle mass  Stress Significant work-related stress affects her eating and sleep patterns. She is considering new job opportunities and is open to change. Discussed stress management techniques and the importance of self-care, encouraging her to weigh the benefits of her current job  while exploring other options. - Encourage stress management techniques - Advise on setting aside time for self-care - Discuss work situation and potential changes - Follow up in four weeks to assess stress levels and coping strategies  Type 2 diabetes mellitus, well controlled Type 2 diabetes is well controlled with Mounjaro , which she tolerates well and requests a refill. Mounjaro  effectively manages glucose levels and aids in weight management. Discussed Mounjaro 's role in regulating glucose, its non-addictive nature, and its effects on hunger control and liver glucose production, reducing excessive insulin  production. - Refill Mounjaro  prescription - Continue monitoring glucose levels - Discuss potential to adjust medication if weight and glucose levels stabilize    She was informed of the importance of frequent follow up visits to maximize her success with intensive lifestyle modifications for her multiple health conditions.    Jasmine Mesi, MD

## 2024-04-18 ENCOUNTER — Other Ambulatory Visit: Payer: Self-pay | Admitting: Family Medicine

## 2024-04-18 DIAGNOSIS — Z8669 Personal history of other diseases of the nervous system and sense organs: Secondary | ICD-10-CM

## 2024-04-19 ENCOUNTER — Other Ambulatory Visit: Payer: Self-pay | Admitting: Family Medicine

## 2024-05-08 ENCOUNTER — Encounter (INDEPENDENT_AMBULATORY_CARE_PROVIDER_SITE_OTHER): Payer: Self-pay | Admitting: Family Medicine

## 2024-05-08 ENCOUNTER — Ambulatory Visit (INDEPENDENT_AMBULATORY_CARE_PROVIDER_SITE_OTHER): Admitting: Family Medicine

## 2024-05-08 VITALS — BP 118/81 | HR 65 | Temp 97.9°F | Ht 64.5 in | Wt 212.0 lb

## 2024-05-08 DIAGNOSIS — E669 Obesity, unspecified: Secondary | ICD-10-CM | POA: Diagnosis not present

## 2024-05-08 DIAGNOSIS — I10 Essential (primary) hypertension: Secondary | ICD-10-CM

## 2024-05-08 DIAGNOSIS — Z7985 Long-term (current) use of injectable non-insulin antidiabetic drugs: Secondary | ICD-10-CM

## 2024-05-08 DIAGNOSIS — F439 Reaction to severe stress, unspecified: Secondary | ICD-10-CM | POA: Diagnosis not present

## 2024-05-08 DIAGNOSIS — Z6841 Body Mass Index (BMI) 40.0 and over, adult: Secondary | ICD-10-CM

## 2024-05-08 DIAGNOSIS — E119 Type 2 diabetes mellitus without complications: Secondary | ICD-10-CM | POA: Diagnosis not present

## 2024-05-08 DIAGNOSIS — Z6835 Body mass index (BMI) 35.0-35.9, adult: Secondary | ICD-10-CM

## 2024-05-08 DIAGNOSIS — E1169 Type 2 diabetes mellitus with other specified complication: Secondary | ICD-10-CM

## 2024-05-08 MED ORDER — TIRZEPATIDE 15 MG/0.5ML ~~LOC~~ SOAJ: 15.0000 mg | SUBCUTANEOUS | 0 refills | Status: DC

## 2024-05-08 NOTE — Progress Notes (Signed)
 Office: [phone]  /  Fax: [phone]  WEIGHT SUMMARY AND BIOMETRICS  Anthropometric Measurements Height: 5' 4.5 (1.638 m) Weight: 212 lb (96.2 kg) BMI (Calculated): 35.84 Weight at Last Visit: 211 lb Weight Lost Since Last Visit: 0 Weight Gained Since Last Visit: 1 lb Starting Weight: 274 lb Total Weight Loss (lbs): 62 lb (28.1 kg) Peak Weight: 289 lb   Body Composition  Body Fat %: 48.1 % Fat Mass (lbs): 102.4 lbs Muscle Mass (lbs): 104.8 lbs Total Body Water (lbs): 80.2 lbs Visceral Fat Rating : 13   Other Clinical Data Fasting: no Labs: no Today's Visit #: 44 Starting Date: 08/07/21    Chief Complaint: OBESITY  History of Present Illness DIVINA NEALE is a 53 year old female who presents for obesity treatment.  She is following a category two eating plan 85% of the time and is attempting to increase her strengthening exercises to 60 minutes twice a week. Despite these efforts, she has gained one pound in the last month. She experiences stress eating, particularly sugar, but notes that she is not eating out of hunger but for comfort. Her comfort eating is described as 'short lived' and not excessive, often just a handful of [employer] pieces. She is currently on Mounjaro  for her DM II  She has a history of being on Lexapro for a long time but is currently on oxcarbazepine  and Rexulti . She feels 'scatterbrained' and has trouble with organizational skills, which she attributes to not wanting to be at her current job. She describes her current situation as 'completely situational.'  She has lost 60 pounds over three years, which she acknowledges as a slow process but is proud of her achievement. She mentions that losing weight slowly has helped her learn to navigate life's challenges better.  She is experiencing significant stress related to her job and is considering a career change. She has been in recovery for 16 years and is celebrating her anniversary. 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      PHYSICAL EXAM:  Blood pressure 118/81, pulse 65, temperature 97.9 F (36.6 C), height 5' 4.5 (1.638 m), weight 212 lb (96.2 kg), last menstrual period 04/25/2013, SpO2 97%. Body mass index is 35.83 kg/m.  DIAGNOSTIC DATA REVIEWED:  BMET    Component Value Date/Time   NA 133 (L) 02/17/2024 1511   NA 140 01/18/2024 0952   K 4.7 02/17/2024 1511   CL 99 02/17/2024 1511   CO2 27 02/17/2024 1511   GLUCOSE 88 02/17/2024 1511   BUN 13 02/17/2024 1511   BUN 13 01/18/2024 0952   CREATININE 0.66 02/17/2024 1511   CALCIUM  8.9 02/17/2024 1511   GFRNONAA >60 12/29/2023 0420   GFRNONAA 106 09/19/2020 0847   GFRAA 123 09/19/2020 0847   Lab Results  Component Value Date   HGBA1C 5.0 02/17/2024   HGBA1C 6.1 (H) 10/27/2012   Lab Results  Component Value Date   INSULIN  41.2 (H) 07/14/2022   INSULIN  29.5 (H) 08/07/2021   Lab Results  Component Value Date   TSH 1.94 07/13/2023   CBC    Component Value Date/Time   WBC 10.6 (H) 12/29/2023 0420   RBC 4.32 12/29/2023 0420   HGB 13.3 12/29/2023 0420   HGB 13.2 08/07/2021 0000   HCT 38.5 12/29/2023 0420   HCT 40.2 08/07/2021 0000   PLT 245 12/29/2023 0420   PLT 284 08/07/2021 0000   MCV 89.1 12/29/2023 0420   MCV  89 08/07/2021 0000   MCH 30.8 12/29/2023 0420   MCHC 34.5 12/29/2023 0420   RDW 14.1 12/29/2023 0420   RDW 13.4 08/07/2021 0000   Iron  Studies    Component Value Date/Time   IRON  54 10/06/2017 1505   TIBC 304 10/06/2017 1505   FERRITIN 151 (H) 10/06/2017 1505   IRONPCTSAT 18 10/06/2017 1505   Lipid Panel     Component Value Date/Time   CHOL 182 01/18/2024 0949   TRIG 131 01/18/2024 0949   HDL 57 01/18/2024 0949   CHOLHDL 3.2 01/18/2024 0949   CHOLHDL 6 07/13/2023 0941   VLDL 39.0 07/13/2023 0941   LDLCALC 102 (H) 01/18/2024 0949   LDLCALC 171 (H) 09/19/2020 0847   LDLDIRECT 145.0 07/18/2019 1410   Hepatic Function Panel      Component Value Date/Time   PROT 7.1 01/18/2024 0952   ALBUMIN 4.4 01/18/2024 0952   AST 19 01/18/2024 0952   ALT 27 01/18/2024 0952   ALKPHOS 76 01/18/2024 0952   BILITOT <0.2 01/18/2024 0952      Component Value Date/Time   TSH 1.94 07/13/2023 0941   Nutritional Lab Results  Component Value Date   VD25OH 78 12/09/2023   VD25OH 76.34 07/13/2023   VD25OH 64.0 07/14/2022     Assessment and Plan Assessment & Plan Obesity and DM II She adheres to a category two eating plan 85% of the time and engages in strengthening exercises for 60 minutes twice weekly. Despite these efforts, she gained one pound in the last month, attributed to stress eating, particularly sugar, though not binging. Emphasized the importance of protein intake during comfort eating. She has lost 60 pounds over three years, a sustainable rate, with slower weight loss associated with better long-term maintenance. - Continue category two eating plan - Continue strengthening exercises - Prescribe Mounjaro  - Encourage protein intake during comfort eating  Hypertension Initial blood pressure was 140/91, normalizing to 118/81 upon repeat measurement. She attributes the initial elevation to stress and coffee consumption. - Continue diet, exercise and weight loss to improve BP  Stress Experiencing significant job-related stress, considering a career change. Reports burnout, difficulty focusing, and retaining mental clarity. Discussed potential medication adjustments for stress management. Advised to contact her mental health provider to discuss possible adjustments. Lexapro was considered, but she is currently on oxcarbazepine  and Rexulti . - Contact mental health provider to discuss medication adjustments     She was informed of the importance of frequent follow up visits to maximize her success with intensive lifestyle modifications for her multiple health conditions.    Jasmine Mesi, MD

## 2024-05-09 ENCOUNTER — Telehealth (INDEPENDENT_AMBULATORY_CARE_PROVIDER_SITE_OTHER): Payer: Self-pay | Admitting: *Deleted

## 2024-05-09 ENCOUNTER — Encounter (HOSPITAL_BASED_OUTPATIENT_CLINIC_OR_DEPARTMENT_OTHER): Payer: Self-pay | Admitting: Pharmacist Clinician (PhC)/ Clinical Pharmacy Specialist

## 2024-05-09 DIAGNOSIS — E782 Mixed hyperlipidemia: Secondary | ICD-10-CM

## 2024-05-09 NOTE — Telephone Encounter (Signed)
 Medication for (Mounjaro -15 mg/0.30ml) has been sent to plan for prior approval. Waiting for response.

## 2024-05-09 NOTE — Telephone Encounter (Signed)
 Information regarding your request An active PA is already on file with expiration date of [id number]. Please wait to resubmit request within 60 days of that expiration date to obtain a PA renewal.

## 2024-05-09 NOTE — Telephone Encounter (Signed)
 Message from Plan CaseId:[id number];Status:Approved;Review Type:Prior Auth;Coverage Start Date:11/30/2023;Coverage End Date:12/03/2024;. Authorization Expiration Date: December 03, 2024. Called patient to confirm that she had received her medication (Mounjaro -15 mg), no answer, left message for call back.

## 2024-05-23 LAB — LIPID PANEL
Chol/HDL Ratio: 2.6 ratio (ref 0.0–4.4)
Cholesterol, Total: 148 mg/dL (ref 100–199)
HDL: 58 mg/dL (ref >39)
LDL Chol Calc (NIH): 75 mg/dL (ref 0–99)
Triglycerides: 77 mg/dL (ref 0–149)
VLDL Cholesterol Cal: 15 mg/dL (ref 5–40)

## 2024-05-24 ENCOUNTER — Ambulatory Visit: Payer: Self-pay | Admitting: Pharmacist Clinician (PhC)/ Clinical Pharmacy Specialist

## 2024-05-29 ENCOUNTER — Telehealth: Payer: Self-pay | Admitting: Pharmacy Technician

## 2024-05-29 NOTE — Telephone Encounter (Signed)
 Gloria [name], The leqvio will be denied due to patient has not failed preferred medication. Preferred medication is Repatha.  Please see blow insert from [facility] guideline from [place].  (if initial) The covered alternative is Repatha (evolocumab subcutaneous injection) [may require prior authorization]. If your patient has  tried this drug, please provide drug strength, date(s) taken and for how long, and what the documented results were of taking this drug,  including any intolerances or adverse reactions your patient experienced. If your patient has NOT tried this drug, please provide details  why your patient can't try this alternative.  Please advise.  Auth Submission: DENIED Site of care: Site of care: [facility] Payer: [employer] Medication & CPT/J Code(s) submitted: Leqvio (Inclisiran) [id number] Diagnosis Code:  Route of submission (phone, fax, portal):  Phone # Fax # Auth type: Buy/Bill PB

## 2024-06-06 ENCOUNTER — Encounter: Payer: Self-pay | Admitting: Pharmacist Clinician (PhC)/ Clinical Pharmacy Specialist

## 2024-06-06 NOTE — Telephone Encounter (Signed)
 Thanks, we'll reach out to patient about Repatha.

## 2024-06-07 ENCOUNTER — Encounter (INDEPENDENT_AMBULATORY_CARE_PROVIDER_SITE_OTHER): Payer: Self-pay | Admitting: Family Medicine

## 2024-06-07 ENCOUNTER — Ambulatory Visit (INDEPENDENT_AMBULATORY_CARE_PROVIDER_SITE_OTHER): Admitting: Family Medicine

## 2024-06-07 VITALS — BP 130/84 | HR 66 | Temp 98.3°F | Ht 64.5 in | Wt 212.0 lb

## 2024-06-07 DIAGNOSIS — Z7985 Long-term (current) use of injectable non-insulin antidiabetic drugs: Secondary | ICD-10-CM

## 2024-06-07 DIAGNOSIS — E669 Obesity, unspecified: Secondary | ICD-10-CM | POA: Diagnosis not present

## 2024-06-07 DIAGNOSIS — E119 Type 2 diabetes mellitus without complications: Secondary | ICD-10-CM | POA: Diagnosis not present

## 2024-06-07 DIAGNOSIS — Z6835 Body mass index (BMI) 35.0-35.9, adult: Secondary | ICD-10-CM | POA: Diagnosis not present

## 2024-06-07 DIAGNOSIS — E1169 Type 2 diabetes mellitus with other specified complication: Secondary | ICD-10-CM

## 2024-06-07 MED ORDER — TIRZEPATIDE 15 MG/0.5ML ~~LOC~~ SOAJ: 15.0000 mg | SUBCUTANEOUS | 0 refills | Status: DC

## 2024-06-07 NOTE — Progress Notes (Signed)
 Office: [phone]  /  Fax: [phone]  WEIGHT SUMMARY AND BIOMETRICS  Anthropometric Measurements Height: 5' 4.5 (1.638 m) Weight: 212 lb (96.2 kg) BMI (Calculated): 35.84 Weight at Last Visit: 212 lb Weight Lost Since Last Visit: 0 Weight Gained Since Last Visit: 0 Starting Weight: 274 lb Total Weight Loss (lbs): 62 lb (28.1 kg) Peak Weight: 289 lb   Body Composition  Body Fat %: 47.5 % Fat Mass (lbs): 100.8 lbs Muscle Mass (lbs): 105.8 lbs Total Body Water (lbs): 79.2 lbs Visceral Fat Rating : 13   Other Clinical Data Fasting: no Labs: no Today's Visit #: 45 Starting Date: 08/07/21    Chief Complaint: OBESITY    History of Present Illness Gloria GUILBERT is a 53 year old female with obesity and type 2 diabetes who presents for a follow-up on her obesity treatment plan and progress assessment.  She adheres to a category two eating plan approximately ninety percent of the time and exercises regularly, engaging in weight training for sixty minutes three times a week. Over the last month, she has maintained a total weight loss of sixty-two pounds. Despite this, she feels frustrated due to a perceived lack of progress, although she acknowledges a decrease in fat and an increase in muscle mass. She wants to break the two hundred pound mark and notes that her clothes have become more comfortable, necessitating a new wardrobe.  Recently, she experienced bloating similar to pre-menstrual symptoms, despite having had a hysterectomy. This bloating lasted about four days. She retains one and a half ovaries, which may contribute to hormonal fluctuations.  She is planning a beach vacation and is concerned about maintaining her diet amidst potential temptations, especially when her children arrive. She has already purchased snacks for them and acknowledges a tendency to indulge in these foods herself. She plans to buy protein-rich snacks to help manage her diet during the  vacation.  She is currently on Mounjaro  for her diabetes management and reports doing well with it. She avoids aspartame due to migraines and is cautious with her carbohydrate intake due to diabetes. She experiences a noticeable physical response to high carbohydrate foods, such as a 'rush' or feeling unwell.      PHYSICAL EXAM:  Blood pressure 130/84, pulse 66, temperature 98.3 F (36.8 C), height 5' 4.5 (1.638 m), weight 212 lb (96.2 kg), last menstrual period 04/25/2013, SpO2 95%. Body mass index is 35.83 kg/m.  DIAGNOSTIC DATA REVIEWED:  BMET    Component Value Date/Time   NA 133 (L) 02/17/2024 1511   NA 140 01/18/2024 0952   K 4.7 02/17/2024 1511   CL 99 02/17/2024 1511   CO2 27 02/17/2024 1511   GLUCOSE 88 02/17/2024 1511   BUN 13 02/17/2024 1511   BUN 13 01/18/2024 0952   CREATININE 0.66 02/17/2024 1511   CALCIUM  8.9 02/17/2024 1511   GFRNONAA >60 12/29/2023 0420   GFRNONAA 106 09/19/2020 0847   GFRAA 123 09/19/2020 0847   Lab Results  Component Value Date   HGBA1C 5.0 02/17/2024   HGBA1C 6.1 (H) 10/27/2012   Lab Results  Component Value Date   INSULIN  41.2 (H) 07/14/2022   INSULIN  29.5 (H) 08/07/2021   Lab Results  Component Value Date   TSH 1.94 07/13/2023   CBC    Component Value Date/Time   WBC 10.6 (H) 12/29/2023 0420   RBC 4.32 12/29/2023 0420   HGB 13.3 12/29/2023 0420   HGB 13.2 08/07/2021 0000   HCT 38.5 12/29/2023 0420  HCT 40.2 08/07/2021 0000   PLT 245 12/29/2023 0420   PLT 284 08/07/2021 0000   MCV 89.1 12/29/2023 0420   MCV 89 08/07/2021 0000   MCH 30.8 12/29/2023 0420   MCHC 34.5 12/29/2023 0420   RDW 14.1 12/29/2023 0420   RDW 13.4 08/07/2021 0000   Iron  Studies    Component Value Date/Time   IRON  54 10/06/2017 1505   TIBC 304 10/06/2017 1505   FERRITIN 151 (H) 10/06/2017 1505   IRONPCTSAT 18 10/06/2017 1505   Lipid Panel     Component Value Date/Time   CHOL 148 05/22/2024 0935   TRIG 77 05/22/2024 0935   HDL 58  05/22/2024 0935   CHOLHDL 2.6 05/22/2024 0935   CHOLHDL 6 07/13/2023 0941   VLDL 39.0 07/13/2023 0941   LDLCALC 75 05/22/2024 0935   LDLCALC 171 (H) 09/19/2020 0847   LDLDIRECT 145.0 07/18/2019 1410   Hepatic Function Panel     Component Value Date/Time   PROT 7.1 01/18/2024 0952   ALBUMIN 4.4 01/18/2024 0952   AST 19 01/18/2024 0952   ALT 27 01/18/2024 0952   ALKPHOS 76 01/18/2024 0952   BILITOT <0.2 01/18/2024 0952      Component Value Date/Time   TSH 1.94 07/13/2023 0941   Nutritional Lab Results  Component Value Date   VD25OH 78 12/09/2023   VD25OH 76.34 07/13/2023   VD25OH 64.0 07/14/2022     Assessment and Plan Assessment & Plan Type 2 Diabetes Mellitus She is managing type 2 diabetes with Mounjaro , which she tolerates well. Discussed the potential of GLP-1 drugs in managing addiction behaviors and a new triple gut hormone drug in testing for additional benefits in blood sugar control and weight loss. - Continue Mounjaro  for diabetes management  Obesity She follows a category two eating plan and exercises regularly, including weight training three times a week. She has maintained a 62-pound weight loss, with increased muscle mass and decreased fat. Although she has not lost additional weight, muscle gain is beneficial for calorie burning. She is frustrated with not breaking the 200-pound mark but was reassured about the positive muscle gain. Discussed the impact of fluid shifts and dietary choices on temporary weight gain and bloating, and strategies for vacation eating focusing on protein-rich snacks and avoiding high-calorie drinks. - Continue category two eating plan - Continue exercise regimen with weight training - Monitor carbohydrate and sodium intake to manage fluid retention - Implement strategies for maintaining healthy eating habits during vacation, including choosing protein-rich snacks and avoiding high-calorie drinks  General Health Maintenance She is  planning a beach vacation and discussed strategies to maintain health, including wearing sunscreen and managing joint health during beach walks. - Use sunscreen with SPF 70 during beach vacation - Consider short, frequent beach walks to minimize joint strain - Purchase protein-rich snacks for vacation  Follow-up She has her next two appointments scheduled and is encouraged to update on her progress and any new developments at the next visit.    She was informed of the importance of frequent follow up visits to maximize her success with intensive lifestyle modifications for her multiple health conditions.    [name], MD

## 2024-06-25 ENCOUNTER — Encounter: Payer: Self-pay | Admitting: Family Medicine

## 2024-06-25 ENCOUNTER — Ambulatory Visit (INDEPENDENT_AMBULATORY_CARE_PROVIDER_SITE_OTHER): Admitting: Family Medicine

## 2024-06-25 VITALS — BP 122/68 | HR 65 | Temp 97.8°F | Ht 64.5 in | Wt 216.6 lb

## 2024-06-25 DIAGNOSIS — E1169 Type 2 diabetes mellitus with other specified complication: Secondary | ICD-10-CM

## 2024-06-25 DIAGNOSIS — R0981 Nasal congestion: Secondary | ICD-10-CM | POA: Diagnosis not present

## 2024-06-25 DIAGNOSIS — H6992 Unspecified Eustachian tube disorder, left ear: Secondary | ICD-10-CM

## 2024-06-25 DIAGNOSIS — H65193 Other acute nonsuppurative otitis media, bilateral: Secondary | ICD-10-CM | POA: Diagnosis not present

## 2024-06-25 DIAGNOSIS — R051 Acute cough: Secondary | ICD-10-CM

## 2024-06-25 DIAGNOSIS — J029 Acute pharyngitis, unspecified: Secondary | ICD-10-CM | POA: Diagnosis not present

## 2024-06-25 DIAGNOSIS — B379 Candidiasis, unspecified: Secondary | ICD-10-CM

## 2024-06-25 DIAGNOSIS — T3695XA Adverse effect of unspecified systemic antibiotic, initial encounter: Secondary | ICD-10-CM

## 2024-06-25 LAB — POC COVID19 BINAXNOW: SARS Coronavirus 2 Ag: NEGATIVE

## 2024-06-25 LAB — POCT RAPID STREP A (OFFICE): Rapid Strep A Screen: NEGATIVE

## 2024-06-25 MED ORDER — BENZONATATE 100 MG PO CAPS: 100.0000 mg | ORAL_CAPSULE | Freq: Two times a day (BID) | ORAL | 0 refills | Status: DC | PRN

## 2024-06-25 MED ORDER — AMOXICILLIN-POT CLAVULANATE 500-125 MG PO TABS: 1.0000 | ORAL_TABLET | Freq: Two times a day (BID) | ORAL | 0 refills | Status: AC

## 2024-06-25 MED ORDER — FLUCONAZOLE 150 MG PO TABS: 150.0000 mg | ORAL_TABLET | Freq: Once | ORAL | 0 refills | Status: AC

## 2024-06-25 NOTE — Progress Notes (Signed)
 Established Patient Office Visit   Subjective  Patient ID: Gloria [name], female    DOB: 1971-06-01  Age: 53 y.o. MRN: [id number]  Chief Complaint  Patient presents with   Acute Visit    Cough, congestion, left side sore throat and ear pressure, fever, started a week ago     Pt is a 53 year old female seen for acute concern.  Patient endorses left ear pressure, left-sided sore throat, subjective fever, cough, congestion x 1 week.  COVID test at home 3 days ago negative.  No longer having rhinorrhea.  Denies nausea, vomiting, changes in appetite, loose stools, facial pain/pressure.  Using Flonase  nasal spray daily.    Patient Active Problem List   Diagnosis Date Noted   Type 2 diabetes mellitus with obesity (HCC) 01/31/2024   Bone lesion 01/12/2024   Non-obstructive CAD 12/30/2023   Unstable angina (HCC) 12/28/2023   Drug-induced weight loss 10/24/2023   Chronic kidney disease    Class 2 obesity due to excess calories with body mass index (BMI) of 38.0 to 38.9 in adult 07/19/2023   Hyponatremia 07/09/2023   BMI 38.0-38.9,adult 05/24/2023   Chronic pain of left ankle 02/26/2023   Other osteoarthritis of spine 01/04/2023   BMI 40.0-44.9, adult (HCC) 01/04/2023   Obesity, Beginning BMI 45.60 01/04/2023   Nasal turbinate hypertrophy 12/10/2022   Nasal vestibulitis 12/10/2022   Low back pain 10/07/2022   Pre-hypertension 08/05/2022   Mixed hyperlipidemia 07/14/2022   Stress 06/09/2022   Essential hypertension 05/03/2022   At risk for heart disease 05/03/2022   Vitamin D  deficiency 04/07/2022   At risk for hypoglycemia 04/07/2022   Acquired cavovarus deformity of right foot 07/08/2021   Rupture of peroneal tendon 07/08/2021   Pain of left hip joint 05/22/2021   Lumbar radiculopathy 03/16/2021   Non-restorative sleep 10/25/2020   Gasping for breath 09/23/2020   Class 3 severe obesity with serious comorbidity and body mass index (BMI) of 45.0 to 49.9 in adult 09/23/2020    Recurrent hypersomnia 09/23/2020   MDD (recurrent major depressive disorder) in remission (HCC) 09/23/2020   Sacroiliac joint pain 06/28/2020   Diabetes mellitus (HCC) 04/06/2019   Degeneration of lumbar intervertebral disc 05/31/2018   Nausea 06/03/2016   Prediabetes 05/13/2016   Migraine without status migrainosus, not intractable 03/30/2016   Bilateral lower extremity edema 03/03/2016   Allergic rhinitis 01/16/2016   Arthralgia 01/16/2016   Myalgia 01/16/2016   H/O metrorrhagia 01/16/2016   Disease of nasal cavity and sinuses 01/16/2016   Diarrhea 01/16/2016   Cafe au lait spots 01/16/2016   Constipation 01/16/2016   IBS (irritable bowel syndrome) 01/16/2016   Sacroiliac joint dysfunction 01/16/2016   OSA on CPAP 02/25/2015   Chronic back pain 02/25/2015   Periodic limb movement disorder 02/25/2014   Restless legs syndrome 12/15/2013   Cardiac murmur 10/16/2013   Snoring 10/16/2013   Hx of migraine headaches 10/16/2013   Fluid retention 10/12/2013   Weight gain 10/12/2013   Anxiety 10/12/2013   Alcoholic liver disease (HCC) 10/28/2012   Ex-cigarette smoker 10/28/2012   Fibromyalgia 10/27/2012   Depression with anxiety 10/27/2012   GERD (gastroesophageal reflux disease) 10/27/2012   H/O ETOH abuse 10/27/2012   Past Medical History:  Diagnosis Date   Alcohol abuse    Allergy    Anemia    hx of- prior to hysterectomy   Anxiety    Arthritis    Bilateral swelling of feet    Bulging disc    X 2  Chronic active hepatitis (HCC)    Chronic back pain 02/25/2015   Chronic fatigue syndrome    Chronic headaches    Chronic kidney disease    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   DDD (degenerative disc disease), lumbar    Depression    Diabetes (HCC)    Diverticulosis    Drug use    Elevated LFTs    Fibromyalgia    GERD (gastroesophageal reflux disease)    History of ETOH abuse    Hyperlipidemia    Hyperplastic colon polyp    Hypertension    Hyponatremia    in the past    Internal hemorrhoids    Migraines    NAFLD (nonalcoholic fatty liver disease)    Obese    Obstructive sleep apnea 02/25/2015   not diagnosed per pt 04-03-20   Osteoarthritis    lower back- DDD   Periodic limb movement disorder 02/25/2014   Pre-diabetes    pt taking metformin    RLS (restless legs syndrome)    Sleep apnea    Smoker    Snoring    Substance abuse (HCC)    Swallowing difficulty    Vitamin B12 deficiency    Vitamin D  deficiency    Past Surgical History:  Procedure Laterality Date   ABDOMINAL HYSTERECTOMY     CERVICAL FUSION     COLONOSCOPY     CYSTOSCOPY  06/06/2013   Procedure: CYSTOSCOPY;  Surgeon: Curlee VEAR Guan, MD;  Location: [facility];  Service: Gynecology;;   EXCISION METACARPAL MASS Right 09/03/2021   Procedure: EXCISION METACARPAL MASS RIGHT SMALL FINGER;  Surgeon: Murrell Kuba, MD;  Location: [facility];  Service: Orthopedics;  Laterality: Right;   INTRAUTERINE DEVICE INSERTION  08/09/2008   PARAGUARD- removed   kidney surgery  1995   abcess   LAPAROSCOPY N/A 06/06/2013   Procedure: LAPAROSCOPY DIAGNOSTIC;  Surgeon: Curlee VEAR Guan, MD;  Location: [facility];  Service: Gynecology;  Laterality: N/A;   LAPAROTOMY  06/06/2013   Procedure: EXPLORATORY LAPAROTOMY;  Surgeon: Curlee VEAR Guan, MD;  Location: [facility];  Service: Gynecology;;   LEFT HEART CATH AND CORONARY ANGIOGRAPHY N/A 12/29/2023   Procedure: LEFT HEART CATH AND CORONARY ANGIOGRAPHY;  Surgeon: [place], Peter M, MD;  Location: [facility];  Service: Cardiovascular;  Laterality: N/A;   LIVER BIOPSY     partial right kideny  Right    radial tunnel Bilateral 2005   VAGINAL HYSTERECTOMY Left 06/06/2013   Procedure: Vaginal Hysterectomy with Patiial Right Salpingectomy;  Surgeon: Curlee VEAR Guan, MD;  Location: [facility];  Service: Gynecology;  Laterality: Left;   Social History   Tobacco Use   Smoking status: Former    Current packs/day: 0.00    Types: Cigarettes    Quit date:  01/26/2012    Years since quitting: 12.4   Smokeless tobacco: Never  Vaping Use   Vaping status: Never Used  Substance Use Topics   Alcohol use: No    Alcohol/week: 0.0 standard drinks of alcohol    Comment: History of alcohol abuse   Drug use: No   Family History  Problem Relation Age of Onset   Cancer Mother    Breast cancer Mother    Depression Mother    Anxiety disorder Mother    Obesity Mother    Sleep apnea Mother    Asthma Mother    Obesity Father    Anxiety disorder Father    Cancer Father    Hyperlipidemia Father  Colon polyps Father    Diabetes Father    Cirrhosis Father    Liver cancer Father    Liver disease Father    Alcoholism Father    Depression Father    Alcohol abuse Father    Sleep apnea Father    Irritable bowel syndrome Sister    Anemia Sister    Sleep apnea Brother    Breast cancer Maternal Aunt    Alcoholism Maternal Aunt    Alcoholism Maternal Grandmother    Alcoholism Maternal Grandfather    Breast cancer Paternal Grandmother    Colon cancer Paternal Grandmother    Esophageal cancer Paternal Grandfather    Stomach cancer Paternal Grandfather    Alcoholism Paternal Grandfather    Rectal cancer Neg Hx    Allergies  Allergen Reactions   Benadryl  [Diphenhydramine  Hcl] Hives   Metformin      Other Reaction(s): headache   Ropinirole  Other (See Comments)    Unknown rxn.   Levofloxacin Other (See Comments)    Unknown rxn   Codeine     Unknown rxn   Doxycycline     Unknown rxn   Lisinopril -Hydrochlorothiazide  Other (See Comments)    Dry cough, dry eyes, insomnia, emotionally liable, increased thirst, brain fog, fatigue.   Metformin  And Related     Blurred vision   Nuvigil  [Armodafinil ]     migraines    Olmesartan  Other (See Comments)    May have contributed to hyponatremia.  Baseline sodium 131-133.   Requip  [Ropinirole  Hcl]     Increased headache   Tramadol      Other reaction(s): Other (See Comments)   Wellbutrin [Bupropion]      Unknown rxn.   Zolpidem      Unknown rxn.    ROS Negative unless stated above    Objective:     BP 122/68 (BP Location: Left Arm, Patient Position: Sitting, Cuff Size: Normal)   Pulse 65   Temp 97.8 F (36.6 C) (Oral)   Ht 5' 4.5 (1.638 m)   Wt 216 lb 9.6 oz (98.2 kg)   LMP 04/25/2013   SpO2 99%   BMI 36.61 kg/m  BP Readings from Last 3 Encounters:  06/25/24 122/68  06/07/24 130/84  05/08/24 118/81   Wt Readings from Last 3 Encounters:  06/25/24 216 lb 9.6 oz (98.2 kg)  06/07/24 212 lb (96.2 kg)  05/08/24 212 lb (96.2 kg)      Physical Exam Constitutional:      General: She is not in acute distress.    Appearance: Normal appearance.  HENT:     Head: Normocephalic and atraumatic.     Right Ear: Ear canal and external ear normal. A middle ear effusion is present. Tympanic membrane is bulging.     Left Ear: Ear canal and external ear normal. Tympanic membrane is bulging.     Ears:     Comments: Bilateral TMs full with effusion.  No erythema or suppurative fluid noted.    Nose: Nose normal.     Mouth/Throat:     Mouth: Mucous membranes are moist.  Neck:     Comments: Tenderness of left lateral neck. Cardiovascular:     Rate and Rhythm: Normal rate and regular rhythm.     Heart sounds: Normal heart sounds. No murmur heard.    No gallop.  Pulmonary:     Effort: Pulmonary effort is normal. No respiratory distress.     Breath sounds: Normal breath sounds. No wheezing, rhonchi or rales.  Lymphadenopathy:  Cervical: Cervical adenopathy present.  Skin:    General: Skin is warm and dry.  Neurological:     Mental Status: She is alert and oriented to person, place, and time.        12/29/2023    2:00 AM 12/09/2023   11:25 AM 07/29/2023   10:24 AM  Depression screen PHQ 2/9  Decreased Interest 0 0 0  Down, Depressed, Hopeless 0 0 1  PHQ - 2 Score 0 0 1  Altered sleeping   1  Tired, decreased energy   2  Change in appetite   1  Feeling bad or failure about  yourself    0  Trouble concentrating   0  Moving slowly or fidgety/restless   0  Suicidal thoughts   0  PHQ-9 Score   5  Difficult doing work/chores   Somewhat difficult      07/29/2023   10:24 AM 07/21/2023    1:50 PM 07/13/2023    9:41 AM 05/11/2023    4:43 PM  GAD 7 : Generalized Anxiety Score  Nervous, Anxious, on Edge 1 1 0 1  Control/stop worrying 0  0 0  Worry too much - different things 1 0 0 0  Trouble relaxing 1 0 0 0  Restless 0 0 0 0  Easily annoyed or irritable 0 0 0 1  Afraid - awful might happen 0 0 0 0  Total GAD 7 Score 3  0 2  Anxiety Difficulty Somewhat difficult Not difficult at all  Somewhat difficult     Results for orders placed or performed in visit on 06/25/24  POC COVID-19 BinaxNow  Result Value Ref Range   SARS Coronavirus 2 Ag Negative Negative  POCT rapid strep A  Result Value Ref Range   Rapid Strep A Screen Negative Negative      Assessment & Plan:   Acute effusion of both middle ears -     Amoxicillin -Pot Clavulanate; Take 1 tablet by mouth in the morning and at bedtime for 7 days.  Dispense: 14 tablet; Refill: 0  Acute cough -     POC COVID-19 BinaxNow -     Benzonatate ; Take 1 capsule (100 mg total) by mouth 2 (two) times daily as needed for cough.  Dispense: 20 capsule; Refill: 0  Dysfunction of left eustachian tube  Sore throat -     POC COVID-19 BinaxNow -     POCT rapid strep A -     Amoxicillin -Pot Clavulanate; Take 1 tablet by mouth in the morning and at bedtime for 7 days.  Dispense: 14 tablet; Refill: 0  Sinus congestion -     POC COVID-19 BinaxNow  Type 2 diabetes mellitus with other specified complication, unspecified whether long term insulin  use (HCC) -     Microalbumin / creatinine urine ratio  Antibiotic-induced yeast infection -     Fluconazole ; Take 1 tablet (150 mg total) by mouth once for 1 dose.  Dispense: 1 tablet; Refill: 0  Patient with acute URI symptoms.  POC COVID and strep testing negative.  Discussed  supportive care with OTC cough/cold medications, Tylenol  or NSAIDs, rest, hydration, warm fluids, etc.  Tessalon  sent to pharmacy for cough.  Wait-and-see Rx for Augmentin  sent in case ear discomfort worsens and AOM develops.  Given precautions.  Return if symptoms worsen or fail to improve.   Clotilda JONELLE Single, MD

## 2024-07-03 ENCOUNTER — Encounter: Payer: Self-pay | Admitting: Family Medicine

## 2024-07-04 ENCOUNTER — Other Ambulatory Visit: Payer: Self-pay | Admitting: Family Medicine

## 2024-07-04 ENCOUNTER — Encounter (INDEPENDENT_AMBULATORY_CARE_PROVIDER_SITE_OTHER): Payer: Self-pay | Admitting: Family Medicine

## 2024-07-04 ENCOUNTER — Ambulatory Visit (INDEPENDENT_AMBULATORY_CARE_PROVIDER_SITE_OTHER): Admitting: Family Medicine

## 2024-07-04 VITALS — BP 129/84 | HR 58 | Temp 98.1°F | Ht 64.5 in | Wt 213.0 lb

## 2024-07-04 DIAGNOSIS — E119 Type 2 diabetes mellitus without complications: Secondary | ICD-10-CM

## 2024-07-04 DIAGNOSIS — T3695XA Adverse effect of unspecified systemic antibiotic, initial encounter: Secondary | ICD-10-CM

## 2024-07-04 DIAGNOSIS — E669 Obesity, unspecified: Secondary | ICD-10-CM

## 2024-07-04 DIAGNOSIS — B3731 Acute candidiasis of vulva and vagina: Secondary | ICD-10-CM

## 2024-07-04 DIAGNOSIS — Z7985 Long-term (current) use of injectable non-insulin antidiabetic drugs: Secondary | ICD-10-CM

## 2024-07-04 DIAGNOSIS — Z6836 Body mass index (BMI) 36.0-36.9, adult: Secondary | ICD-10-CM

## 2024-07-04 DIAGNOSIS — E1169 Type 2 diabetes mellitus with other specified complication: Secondary | ICD-10-CM

## 2024-07-04 DIAGNOSIS — B379 Candidiasis, unspecified: Secondary | ICD-10-CM

## 2024-07-04 MED ORDER — TIRZEPATIDE 15 MG/0.5ML ~~LOC~~ SOAJ: 15.0000 mg | SUBCUTANEOUS | 0 refills | Status: DC

## 2024-07-04 MED ORDER — FLUCONAZOLE 150 MG PO TABS: 150.0000 mg | ORAL_TABLET | Freq: Once | ORAL | 0 refills | Status: AC

## 2024-07-04 NOTE — Progress Notes (Signed)
 Office: [phone]  /  Fax: [phone]  WEIGHT SUMMARY AND BIOMETRICS  Anthropometric Measurements Height: 5' 4.5 (1.638 m) Weight: 213 lb (96.6 kg) BMI (Calculated): 36.01 Weight at Last Visit: 212 lb Weight Lost Since Last Visit: 0 Weight Gained Since Last Visit: 1 lb Starting Weight: 274 lb Total Weight Loss (lbs): 61 lb (27.7 kg) Peak Weight: 289 lb   Body Composition  Body Fat %: 47.4 % Fat Mass (lbs): 101.4 lbs Muscle Mass (lbs): 106.8 lbs Total Body Water (lbs): 81 lbs Visceral Fat Rating : 13   Other Clinical Data Fasting: no Labs: no Today's Visit #: 46 Starting Date: 08/07/21    Chief Complaint: OBESITY     History of Present Illness Gloria [name] is a 53 year old female with obesity and type 2 diabetes who presents for obesity treatment plan assessment and progress evaluation.  She is adhering to a category two eating plan approximately seventy percent of the time, ensuring adequate hydration, working towards meeting protein goals, and attempting to avoid skipping meals. She engages in physical activity three days a week for forty-five to sixty minutes at the gym, focusing on weightlifting. Despite these efforts, she has gained one pound in the last month since her last visit.  In managing her type 2 diabetes, she is utilizing diet, exercise, and weight loss strategies. She is currently on Mounjaro  15 mg per week and requests a refill. She reports no gastrointestinal issues or other problems with Mounjaro .  She recently experienced a cold that progressed to an ear infection after a week, for which she was treated with antibiotics. She was also given Diflucan  for a yeast infection, which she took at the end of the antibiotic course. She reports significant discomfort, including swelling, itching, and pain, and is concerned about the delayed relief from the Diflucan .  She is planning a trip to [place] with her mother from October 20th to  27th. Her mother has worsening COPD and macular degeneration, which has recently led to her inability to drive.      PHYSICAL EXAM:  Blood pressure 129/84, pulse (!) 58, temperature 98.1 F (36.7 C), height 5' 4.5 (1.638 m), weight 213 lb (96.6 kg), last menstrual period 04/25/2013, SpO2 98%. Body mass index is 36 kg/m.  DIAGNOSTIC DATA REVIEWED:  BMET    Component Value Date/Time   NA 133 (L) 02/17/2024 1511   NA 140 01/18/2024 0952   K 4.7 02/17/2024 1511   CL 99 02/17/2024 1511   CO2 27 02/17/2024 1511   GLUCOSE 88 02/17/2024 1511   BUN 13 02/17/2024 1511   BUN 13 01/18/2024 0952   CREATININE 0.66 02/17/2024 1511   CALCIUM  8.9 02/17/2024 1511   GFRNONAA >60 12/29/2023 0420   GFRNONAA 106 09/19/2020 0847   GFRAA 123 09/19/2020 0847   Lab Results  Component Value Date   HGBA1C 5.0 02/17/2024   HGBA1C 6.1 (H) 10/27/2012   Lab Results  Component Value Date   INSULIN  41.2 (H) 07/14/2022   INSULIN  29.5 (H) 08/07/2021   Lab Results  Component Value Date   TSH 1.94 07/13/2023   CBC    Component Value Date/Time   WBC 10.6 (H) 12/29/2023 0420   RBC 4.32 12/29/2023 0420   HGB 13.3 12/29/2023 0420   HGB 13.2 08/07/2021 0000   HCT 38.5 12/29/2023 0420   HCT 40.2 08/07/2021 0000   PLT 245 12/29/2023 0420   PLT 284 08/07/2021 0000   MCV 89.1 12/29/2023 0420  MCV 89 08/07/2021 0000   MCH 30.8 12/29/2023 0420   MCHC 34.5 12/29/2023 0420   RDW 14.1 12/29/2023 0420   RDW 13.4 08/07/2021 0000   Iron  Studies    Component Value Date/Time   IRON  54 10/06/2017 1505   TIBC 304 10/06/2017 1505   FERRITIN 151 (H) 10/06/2017 1505   IRONPCTSAT 18 10/06/2017 1505   Lipid Panel     Component Value Date/Time   CHOL 148 05/22/2024 0935   TRIG 77 05/22/2024 0935   HDL 58 05/22/2024 0935   CHOLHDL 2.6 05/22/2024 0935   CHOLHDL 6 07/13/2023 0941   VLDL 39.0 07/13/2023 0941   LDLCALC 75 05/22/2024 0935   LDLCALC 171 (H) 09/19/2020 0847   LDLDIRECT 145.0 07/18/2019  1410   Hepatic Function Panel     Component Value Date/Time   PROT 7.1 01/18/2024 0952   ALBUMIN 4.4 01/18/2024 0952   AST 19 01/18/2024 0952   ALT 27 01/18/2024 0952   ALKPHOS 76 01/18/2024 0952   BILITOT <0.2 01/18/2024 0952      Component Value Date/Time   TSH 1.94 07/13/2023 0941   Nutritional Lab Results  Component Value Date   VD25OH 78 12/09/2023   VD25OH 76.34 07/13/2023   VD25OH 64.0 07/14/2022     Assessment and Plan Assessment & Plan Obesity Following the category two eating plan 70% of the time, meeting protein goals, and exercising with weights three days a week. Gained one pound, attributed to increased muscle mass and water retention. Hunger and cravings managed with small portions of desired foods. - Continue category two eating plan - Continue exercise regimen three days a week  Type 2 diabetes mellitus Managing diabetes with diet, exercise, and weight loss efforts. On Mounjaro  15 mg per week with no gastrointestinal issues. - Refill Mounjaro  15 mg per week - Continue current diabetes management plan  Vulvovaginal candidiasis Developed following antibiotic treatment for an ear infection. Took Diflucan  at the end of the antibiotic course, which may delay symptom relief. Symptoms include swelling, itching, and pain. Differential diagnosis includes potential STD, but symptoms are consistent with yeast infection. Topical treatment can provide faster symptom relief than oral medication. - Use clotrimazole  cream for three days for symptom relief -Follow up with PCP or GYN if symptoms fail to improve or other symptoms develop      She was informed of the importance of frequent follow up visits to maximize her success with intensive lifestyle modifications for her multiple health conditions.    [name], MD

## 2024-07-27 ENCOUNTER — Other Ambulatory Visit: Payer: Self-pay | Admitting: Family Medicine

## 2024-08-02 ENCOUNTER — Other Ambulatory Visit: Payer: Self-pay | Admitting: Family Medicine

## 2024-08-02 DIAGNOSIS — I10 Essential (primary) hypertension: Secondary | ICD-10-CM

## 2024-08-08 ENCOUNTER — Other Ambulatory Visit (HOSPITAL_BASED_OUTPATIENT_CLINIC_OR_DEPARTMENT_OTHER): Payer: Self-pay

## 2024-08-08 ENCOUNTER — Encounter (INDEPENDENT_AMBULATORY_CARE_PROVIDER_SITE_OTHER): Payer: Self-pay | Admitting: Family Medicine

## 2024-08-08 ENCOUNTER — Ambulatory Visit (INDEPENDENT_AMBULATORY_CARE_PROVIDER_SITE_OTHER): Admitting: Family Medicine

## 2024-08-08 DIAGNOSIS — Z7985 Long-term (current) use of injectable non-insulin antidiabetic drugs: Secondary | ICD-10-CM | POA: Diagnosis not present

## 2024-08-08 DIAGNOSIS — E119 Type 2 diabetes mellitus without complications: Secondary | ICD-10-CM

## 2024-08-08 DIAGNOSIS — E1169 Type 2 diabetes mellitus with other specified complication: Secondary | ICD-10-CM

## 2024-08-08 DIAGNOSIS — Z6836 Body mass index (BMI) 36.0-36.9, adult: Secondary | ICD-10-CM

## 2024-08-08 DIAGNOSIS — E669 Obesity, unspecified: Secondary | ICD-10-CM

## 2024-08-08 MED ORDER — TIRZEPATIDE 15 MG/0.5ML ~~LOC~~ SOAJ
15.0000 mg | SUBCUTANEOUS | 0 refills | Status: DC
  Filled 2024-08-08: qty 6, 84d supply, fill #0

## 2024-08-08 NOTE — Progress Notes (Signed)
 Office: [phone]  /  Fax: [phone]  WEIGHT SUMMARY AND BIOMETRICS  Anthropometric Measurements Height: 5' 4.5 (1.638 m) Weight: 215 lb (97.5 kg) BMI (Calculated): 36.35 Weight at Last Visit: 213 lb Weight Lost Since Last Visit: 0 Weight Gained Since Last Visit: 2 lb Starting Weight: 274 lb Total Weight Loss (lbs): 59 lb (26.8 kg) Peak Weight: 289 lb   Body Composition  Body Fat %: 48.6 % Fat Mass (lbs): 104.4 lbs Muscle Mass (lbs): 105 lbs Total Body Water (lbs): 82.6 lbs Visceral Fat Rating : 13   Other Clinical Data Fasting: no Labs: no Today's Visit #: 5 Starting Date: 08/07/21    Chief Complaint: OBESITY  History of Present Illness NIJAE DOYEL is a 53 year old female with obesity and type two diabetes who presents for obesity treatment and progress assessment.  She has been following the category two eating plan about fifty percent of the time and engages in physical activity twice a week, including both weight training and cardiovascular exercises for sixty minutes per session. Despite these efforts, she has gained two pounds over the past month since her last visit. Her diet includes a mix of home-prepared meals and larger portions when dining out, with a focus on protein intake.  In addition to managing obesity, she is addressing her type two diabetes. She is currently taking Mounjaro  15 mg for diabetes management. She continues to focus on her diet, exercise, and overall weight loss as part of her diabetes management strategy. Recent A1c well controlled at 5.0      PHYSICAL EXAM:  Blood pressure 111/74, pulse 62, temperature 98 F (36.7 C), height 5' 4.5 (1.638 m), weight 215 lb (97.5 kg), last menstrual period 04/25/2013, SpO2 98%. Body mass index is 36.33 kg/m.  DIAGNOSTIC DATA REVIEWED:  BMET    Component Value Date/Time   NA 133 (L) 02/17/2024 1511   NA 140 01/18/2024 0952   K 4.7 02/17/2024 1511   CL 99 02/17/2024 1511   CO2  27 02/17/2024 1511   GLUCOSE 88 02/17/2024 1511   BUN 13 02/17/2024 1511   BUN 13 01/18/2024 0952   CREATININE 0.66 02/17/2024 1511   CALCIUM  8.9 02/17/2024 1511   GFRNONAA >60 12/29/2023 0420   GFRNONAA 106 09/19/2020 0847   GFRAA 123 09/19/2020 0847   Lab Results  Component Value Date   HGBA1C 5.0 02/17/2024   HGBA1C 6.1 (H) 10/27/2012   Lab Results  Component Value Date   INSULIN  41.2 (H) 07/14/2022   INSULIN  29.5 (H) 08/07/2021   Lab Results  Component Value Date   TSH 1.94 07/13/2023   CBC    Component Value Date/Time   WBC 10.6 (H) 12/29/2023 0420   RBC 4.32 12/29/2023 0420   HGB 13.3 12/29/2023 0420   HGB 13.2 08/07/2021 0000   HCT 38.5 12/29/2023 0420   HCT 40.2 08/07/2021 0000   PLT 245 12/29/2023 0420   PLT 284 08/07/2021 0000   MCV 89.1 12/29/2023 0420   MCV 89 08/07/2021 0000   MCH 30.8 12/29/2023 0420   MCHC 34.5 12/29/2023 0420   RDW 14.1 12/29/2023 0420   RDW 13.4 08/07/2021 0000   Iron  Studies    Component Value Date/Time   IRON  54 10/06/2017 1505   TIBC 304 10/06/2017 1505   FERRITIN 151 (H) 10/06/2017 1505   IRONPCTSAT 18 10/06/2017 1505   Lipid Panel     Component Value Date/Time   CHOL 148 05/22/2024 0935   TRIG 77 05/22/2024  0935   HDL 58 05/22/2024 0935   CHOLHDL 2.6 05/22/2024 0935   CHOLHDL 6 07/13/2023 0941   VLDL 39.0 07/13/2023 0941   LDLCALC 75 05/22/2024 0935   LDLCALC 171 (H) 09/19/2020 0847   LDLDIRECT 145.0 07/18/2019 1410   Hepatic Function Panel     Component Value Date/Time   PROT 7.1 01/18/2024 0952   ALBUMIN 4.4 01/18/2024 0952   AST 19 01/18/2024 0952   ALT 27 01/18/2024 0952   ALKPHOS 76 01/18/2024 0952   BILITOT <0.2 01/18/2024 0952      Component Value Date/Time   TSH 1.94 07/13/2023 0941   Nutritional Lab Results  Component Value Date   VD25OH 78 12/09/2023   VD25OH 76.34 07/13/2023   VD25OH 64.0 07/14/2022     Assessment and Plan Assessment & Plan Obesity Following category two eating  plan 50% of the time and exercising twice a week for 60 minutes, combining weights and cardio. Gained two pounds in the last month attributed to water retention rather than fat gain. Normal water shift; advised hydration and salt management. - Continue category two eating plan - Continue current exercise regimen - Advise increased hydration - Advise salt management  Type 2 diabetes mellitus On Mounjaro  15 mg for diabetes management. Plan to refill prescription with a 90-day supply to accommodate travel needs. Prescription will be sent to [facility] for convenience and to increase likelihood of obtaining a 90-day supply. - Refill Mounjaro  15 mg weekly - Attempt 90-day supply for Mounjaro  - Send prescription to [facility] - Continue diet, exercise and weight loss as discussed today as an important part of the treatment plan      Greysen was informed of the importance of frequent follow up visits to maximize her success with intensive lifestyle modifications for her obesity and obesity related health conditions as recommended by USPSTF and CMS guidelines   [name], MD

## 2024-08-09 ENCOUNTER — Telehealth: Payer: Self-pay | Admitting: *Deleted

## 2024-08-09 ENCOUNTER — Encounter: Payer: Self-pay | Admitting: Obstetrics and Gynecology

## 2024-08-09 ENCOUNTER — Ambulatory Visit: Admitting: Nurse Practitioner

## 2024-08-09 ENCOUNTER — Encounter: Payer: Self-pay | Admitting: Nurse Practitioner

## 2024-08-09 VITALS — BP 114/78 | HR 70 | Resp 16

## 2024-08-09 DIAGNOSIS — N898 Other specified noninflammatory disorders of vagina: Secondary | ICD-10-CM

## 2024-08-09 DIAGNOSIS — Z01419 Encounter for gynecological examination (general) (routine) without abnormal findings: Secondary | ICD-10-CM

## 2024-08-09 NOTE — Telephone Encounter (Signed)
 Patient seen OBGYN

## 2024-08-09 NOTE — Telephone Encounter (Signed)
 Patient is scheduled for office visit with Annabella Shutter at 3:30 today.

## 2024-08-09 NOTE — Progress Notes (Signed)
   Acute Office Visit  Subjective:    Patient ID: Gloria [name], female    DOB: [birth year], 53 y.o.   MRN: [id number]   HPI 53 y.o. presents today for vaginal check. Patient is unsure if pieces of a cleansing wipe are inside of her vagina.   Patient's last menstrual period was 04/25/2013.    Review of Systems  Constitutional: Negative.   Genitourinary: Negative.        Objective:    Physical Exam Constitutional:      Appearance: Normal appearance.  Genitourinary:    General: Normal vulva.     Vagina: Normal.     Uterus: Absent.      Comments: 10 mm benign appearing vaginal cyst at posterior cuff. No paper products present    BP 114/78   Pulse 70   Resp 16   LMP 04/25/2013  Wt Readings from Last 3 Encounters:  08/08/24 215 lb (97.5 kg)  07/04/24 213 lb (96.6 kg)  06/25/24 216 lb 9.6 oz (98.2 kg)        Zada Louder, CMA present as Biomedical engineer.   Assessment & Plan:   Problem List Items Addressed This Visit   None Visit Diagnoses       Normal vaginal exam    -  Primary      Plan: Normal vaginal exam. Reassurance provided.   Return if symptoms worsen or fail to improve.    Annabella DELENA Shutter DNP, 3:46 PM 08/09/2024

## 2024-08-09 NOTE — Telephone Encounter (Signed)
 Copied from CRM [id number]. Topic: Clinical - Red Word Triage >> Aug 09, 2024 12:54 PM Pinkey ORN wrote: Red Word that prompted transfer to Nurse Triage: Pain >> Aug 09, 2024 12:55 PM Pinkey ORN wrote: Patient states she believes she has some feminine wipes stuck inside of her vaginal area. Patient refused to be triaged.

## 2024-09-06 ENCOUNTER — Ambulatory Visit (INDEPENDENT_AMBULATORY_CARE_PROVIDER_SITE_OTHER): Admitting: Family Medicine

## 2024-09-06 ENCOUNTER — Encounter (INDEPENDENT_AMBULATORY_CARE_PROVIDER_SITE_OTHER): Payer: Self-pay | Admitting: Family Medicine

## 2024-09-06 VITALS — BP 118/74 | HR 66 | Temp 98.3°F | Ht 64.5 in | Wt 214.0 lb

## 2024-09-06 DIAGNOSIS — E1169 Type 2 diabetes mellitus with other specified complication: Secondary | ICD-10-CM | POA: Diagnosis not present

## 2024-09-06 DIAGNOSIS — Z7985 Long-term (current) use of injectable non-insulin antidiabetic drugs: Secondary | ICD-10-CM | POA: Diagnosis not present

## 2024-09-06 DIAGNOSIS — E669 Obesity, unspecified: Secondary | ICD-10-CM | POA: Diagnosis not present

## 2024-09-06 DIAGNOSIS — Z6836 Body mass index (BMI) 36.0-36.9, adult: Secondary | ICD-10-CM

## 2024-09-06 NOTE — Progress Notes (Signed)
 Office: [phone]  /  Fax: [phone]  WEIGHT SUMMARY AND BIOMETRICS  Anthropometric Measurements Height: 5' 4.5 (1.638 m) Weight: 214 lb (97.1 kg) BMI (Calculated): 36.18 Weight at Last Visit: 215 lb Weight Lost Since Last Visit: 1 lb Weight Gained Since Last Visit: 0 Starting Weight: 274 lb Total Weight Loss (lbs): 60 lb (27.2 kg) Peak Weight: 289 lb   Body Composition  Body Fat %: 48.8 % Fat Mass (lbs): 104.6 lbs Muscle Mass (lbs): 104 lbs Total Body Water (lbs): 81.2 lbs Visceral Fat Rating : 13   Other Clinical Data Fasting: no Labs: no Today's Visit #: 48 Starting Date: 08/07/21    Chief Complaint: OBESITY    History of Present Illness Gloria [name] is a 53 year old female with type 2 diabetes who presents for regular sleep treatment and assessment of progress.  She generally achieves seven or more hours of sleep per night but struggles with maintaining adequate hydration. She has lost one pound since her last visit a month ago.  She adheres to a category two eating plan approximately eighty-five percent of the time, focusing on increasing her intake of fruits, vegetables, and protein. She does not skip meals and engages in physical activity for sixty minutes, three days a week, using the elliptical bike and performing various strength training exercises. She aims for close to 100 grams of protein per day, supplementing with snacks like cheese, beef sticks, and protein drinks, especially on gym days.  She started taking Divalproex extended-release last night, prescribed by her psychiatrist, with an expected onset of effect in about a week. Over the past three weeks, she has experienced increased emotional distress, including episodes of crying, difficulty concentrating, and a short temper, which she attributes to stressors related to her mother, work, and personal relationships.  She is on Mounjaro  15 mg weekly for her type 2 diabetes.  She is  concerned about potential weight gain due to emotional eating, which can be exacerbated by medications that regulate emotions.      PHYSICAL EXAM:  Blood pressure 118/74, pulse 66, temperature 98.3 F (36.8 C), height 5' 4.5 (1.638 m), weight 214 lb (97.1 kg), last menstrual period 04/25/2013, SpO2 99%. Body mass index is 36.17 kg/m.  DIAGNOSTIC DATA REVIEWED:  BMET    Component Value Date/Time   NA 133 (L) 02/17/2024 1511   NA 140 01/18/2024 0952   K 4.7 02/17/2024 1511   CL 99 02/17/2024 1511   CO2 27 02/17/2024 1511   GLUCOSE 88 02/17/2024 1511   BUN 13 02/17/2024 1511   BUN 13 01/18/2024 0952   CREATININE 0.66 02/17/2024 1511   CALCIUM  8.9 02/17/2024 1511   GFRNONAA >60 12/29/2023 0420   GFRNONAA 106 09/19/2020 0847   GFRAA 123 09/19/2020 0847   Lab Results  Component Value Date   HGBA1C 5.0 02/17/2024   HGBA1C 6.1 (H) 10/27/2012   Lab Results  Component Value Date   INSULIN  41.2 (H) 07/14/2022   INSULIN  29.5 (H) 08/07/2021   Lab Results  Component Value Date   TSH 1.94 07/13/2023   CBC    Component Value Date/Time   WBC 10.6 (H) 12/29/2023 0420   RBC 4.32 12/29/2023 0420   HGB 13.3 12/29/2023 0420   HGB 13.2 08/07/2021 0000   HCT 38.5 12/29/2023 0420   HCT 40.2 08/07/2021 0000   PLT 245 12/29/2023 0420   PLT 284 08/07/2021 0000   MCV 89.1 12/29/2023 0420   MCV 89 08/07/2021 0000  MCH 30.8 12/29/2023 0420   MCHC 34.5 12/29/2023 0420   RDW 14.1 12/29/2023 0420   RDW 13.4 08/07/2021 0000   Iron  Studies    Component Value Date/Time   IRON  54 10/06/2017 1505   TIBC 304 10/06/2017 1505   FERRITIN 151 (H) 10/06/2017 1505   IRONPCTSAT 18 10/06/2017 1505   Lipid Panel     Component Value Date/Time   CHOL 148 05/22/2024 0935   TRIG 77 05/22/2024 0935   HDL 58 05/22/2024 0935   CHOLHDL 2.6 05/22/2024 0935   CHOLHDL 6 07/13/2023 0941   VLDL 39.0 07/13/2023 0941   LDLCALC 75 05/22/2024 0935   LDLCALC 171 (H) 09/19/2020 0847   LDLDIRECT 145.0  07/18/2019 1410   Hepatic Function Panel     Component Value Date/Time   PROT 7.1 01/18/2024 0952   ALBUMIN 4.4 01/18/2024 0952   AST 19 01/18/2024 0952   ALT 27 01/18/2024 0952   ALKPHOS 76 01/18/2024 0952   BILITOT <0.2 01/18/2024 0952      Component Value Date/Time   TSH 1.94 07/13/2023 0941   Nutritional Lab Results  Component Value Date   VD25OH 78 12/09/2023   VD25OH 76.34 07/13/2023   VD25OH 64.0 07/14/2022     Assessment and Plan Assessment & Plan Obesity Following a category two eating plan 85% of the time and exercises for 60 minutes three days a week. Working on increasing intake of fruits, vegetables, and protein but struggles with adequate hydration. Lost one pound in the last month. Aware of potential weight gain due to new medication, Divalproex, and advised to maintain full meals to prevent emotional eating. Advised to consume 100 grams of protein daily, with a minimum of 85 grams, and to aim for 30 grams of protein per meal for optimal absorption. Discussed potential for emotional eating with new medication and strategies to mitigate it. - Continue category two eating plan. - Continue exercise regimen of 60 minutes, three days a week. - Increase hydration. - Ensure daily protein intake of 100 grams, with at least 30 grams per meal. - Monitor weight and adjust diet as needed.  Type 2 diabetes mellitus On Mounjaro  15 mg weekly for type 2 diabetes mellitus and is well-managed with no side effects. Working on diet, exercise, and weight loss to improve glucose levels. - Continue Mounjaro  15 mg weekly. - Continue diet and exercise regimen to improve glucose levels.    Ranae was informed of the importance of frequent follow up visits to maximize her success with intensive lifestyle modifications for her obesity and obesity related health conditions as recommended by USPSTF and CMS guidelines   [name], MD

## 2024-09-19 ENCOUNTER — Other Ambulatory Visit: Payer: Self-pay | Admitting: Family Medicine

## 2024-09-19 DIAGNOSIS — Z1231 Encounter for screening mammogram for malignant neoplasm of breast: Secondary | ICD-10-CM

## 2024-09-27 ENCOUNTER — Other Ambulatory Visit: Payer: Self-pay | Admitting: Cardiology

## 2024-09-28 MED ORDER — ROSUVASTATIN CALCIUM 20 MG PO TABS: 20.0000 mg | ORAL_TABLET | Freq: Every day | ORAL | 0 refills | Status: DC

## 2024-10-03 ENCOUNTER — Ambulatory Visit (INDEPENDENT_AMBULATORY_CARE_PROVIDER_SITE_OTHER): Payer: Self-pay | Admitting: Family Medicine

## 2024-10-03 LAB — LAB REPORT - SCANNED: EGFR: 105

## 2024-10-30 ENCOUNTER — Other Ambulatory Visit (HOSPITAL_BASED_OUTPATIENT_CLINIC_OR_DEPARTMENT_OTHER): Payer: Self-pay

## 2024-10-30 ENCOUNTER — Encounter (INDEPENDENT_AMBULATORY_CARE_PROVIDER_SITE_OTHER): Payer: Self-pay | Admitting: Family Medicine

## 2024-10-30 ENCOUNTER — Ambulatory Visit (INDEPENDENT_AMBULATORY_CARE_PROVIDER_SITE_OTHER): Payer: Self-pay | Admitting: Family Medicine

## 2024-10-30 VITALS — BP 116/71 | HR 67 | Temp 98.0°F | Ht 64.5 in | Wt 215.0 lb

## 2024-10-30 DIAGNOSIS — E1169 Type 2 diabetes mellitus with other specified complication: Secondary | ICD-10-CM

## 2024-10-30 DIAGNOSIS — Z6836 Body mass index (BMI) 36.0-36.9, adult: Secondary | ICD-10-CM

## 2024-10-30 MED ORDER — TIRZEPATIDE 15 MG/0.5ML ~~LOC~~ SOAJ
15.0000 mg | SUBCUTANEOUS | 0 refills | Status: AC
  Filled 2024-10-30: qty 6, 84d supply, fill #0

## 2024-10-30 NOTE — Progress Notes (Signed)
 Office: [phone]  /  Fax: [phone]  WEIGHT SUMMARY AND BIOMETRICS  Anthropometric Measurements Height: 5' 4.5 (1.638 m) Weight: 215 lb (97.5 kg) BMI (Calculated): 36.35 Weight at Last Visit: 214 lb Weight Lost Since Last Visit: 0 Weight Gained Since Last Visit: 1 lb Starting Weight: 274 lb Total Weight Loss (lbs): 59 lb (26.8 kg) Peak Weight: 289 lb   Body Composition  Body Fat %: 48.6 % Fat Mass (lbs): 104.6 lbs Muscle Mass (lbs): 105 lbs Total Body Water (lbs): 80 lbs Visceral Fat Rating : 13   Other Clinical Data Fasting: no Labs: no Today's Visit #: 32 Starting Date: 08/07/21    Chief Complaint: OBESITY  History of Present Illness Gloria [name] is a 53 year old female with obesity and type 2 diabetes who presents for obesity treatment and progress assessment.  She is adhering to the prescribed category two obesity treatment plan about forty percent of the time. Over the last two months, she has gained one pound, including during the Halloween and Thanksgiving period. Her ability to concentrate on exercise is impaired due to her mother's recent hospitalization, which has occupied her spare time.  In addition to obesity, she is being treated for type 2 diabetes and is currently on Mounjaro  15 mg weekly. She requests a refill of her medication.  She has been experiencing a major depressive disorder episode since her last visit. She is on Depakote, initially at 250 mg, which was increased to 500 mg at night last week. There is some confusion regarding the dosage, as she believes she may have taken too much recently.  Her mother's health issues have significantly impacted her routine, including her diet and exercise habits. Her mother was hospitalized for pulmonary issues and later for atrial fibrillation, requiring cardioversion and plans for aortic valve replacement. This has contributed to her inability to maintain her usual health regimen.       PHYSICAL EXAM:  Blood pressure 116/71, pulse 67, temperature 98 F (36.7 C), height 5' 4.5 (1.638 m), weight 215 lb (97.5 kg), last menstrual period 04/25/2013, SpO2 99%. Body mass index is 36.33 kg/m.  DIAGNOSTIC DATA REVIEWED:  BMET    Component Value Date/Time   NA 133 (L) 02/17/2024 1511   NA 140 01/18/2024 0952   K 4.7 02/17/2024 1511   CL 99 02/17/2024 1511   CO2 27 02/17/2024 1511   GLUCOSE 88 02/17/2024 1511   BUN 13 02/17/2024 1511   BUN 13 01/18/2024 0952   CREATININE 0.66 02/17/2024 1511   CALCIUM  8.9 02/17/2024 1511   GFRNONAA >60 12/29/2023 0420   GFRNONAA 106 09/19/2020 0847   GFRAA 123 09/19/2020 0847   Lab Results  Component Value Date   HGBA1C 5.0 02/17/2024   HGBA1C 6.1 (H) 10/27/2012   Lab Results  Component Value Date   INSULIN  41.2 (H) 07/14/2022   INSULIN  29.5 (H) 08/07/2021   Lab Results  Component Value Date   TSH 1.94 07/13/2023   CBC    Component Value Date/Time   WBC 10.6 (H) 12/29/2023 0420   RBC 4.32 12/29/2023 0420   HGB 13.3 12/29/2023 0420   HGB 13.2 08/07/2021 0000   HCT 38.5 12/29/2023 0420   HCT 40.2 08/07/2021 0000   PLT 245 12/29/2023 0420   PLT 284 08/07/2021 0000   MCV 89.1 12/29/2023 0420   MCV 89 08/07/2021 0000   MCH 30.8 12/29/2023 0420   MCHC 34.5 12/29/2023 0420   RDW 14.1 12/29/2023 0420   RDW  13.4 08/07/2021 0000   Iron  Studies    Component Value Date/Time   IRON  54 10/06/2017 1505   TIBC 304 10/06/2017 1505   FERRITIN 151 (H) 10/06/2017 1505   IRONPCTSAT 18 10/06/2017 1505   Lipid Panel     Component Value Date/Time   CHOL 148 05/22/2024 0935   TRIG 77 05/22/2024 0935   HDL 58 05/22/2024 0935   CHOLHDL 2.6 05/22/2024 0935   CHOLHDL 6 07/13/2023 0941   VLDL 39.0 07/13/2023 0941   LDLCALC 75 05/22/2024 0935   LDLCALC 171 (H) 09/19/2020 0847   LDLDIRECT 145.0 07/18/2019 1410   Hepatic Function Panel     Component Value Date/Time   PROT 7.1 01/18/2024 0952   ALBUMIN 4.4 01/18/2024 0952    AST 19 01/18/2024 0952   ALT 27 01/18/2024 0952   ALKPHOS 76 01/18/2024 0952   BILITOT <0.2 01/18/2024 0952      Component Value Date/Time   TSH 1.94 07/13/2023 0941   Nutritional Lab Results  Component Value Date   VD25OH 78 12/09/2023   VD25OH 76.34 07/13/2023   VD25OH 64.0 07/14/2022     Assessment and Plan Assessment & Plan Morbid obesity She has gained one pound over the last two months, including over Halloween and Thanksgiving, despite challenges due to her mother's hospitalization. She is following the category two plan about 40% of the time and has not been able to concentrate on exercise due to caregiving responsibilities. - Continue category two plan - Goal to increase exercise next month  Type 2 diabetes mellitus with other specified complication She is currently on Mounjaro  15 mg weekly and requests a refill. Her diabetes management is ongoing. - Refilled Mounjaro  15 mg weekly - Continue diet, exercise and weight loss as discussed today as an important part of the treatment plan   Major depressive disorder, recurrent She has been experiencing an MDD episode, characterized by emotional distress. She has been prescribed Depakote, with a recent increase to 500 mg at night. There is confusion regarding the dosage due to a discrepancy in the prescription history. - Take 500 mg of Depakote at night - Contact prescribing office to clarify Depakote dosage - Scheduled follow-up appointment in January      Patients who are on anti-obesity medications are counseled on the importance of maintaining healthy lifestyle habits, including balanced nutrition, regular physical activity, and behavioral modifications,  Medication is an adjunct to, not a replacement for, lifestyle changes and that the long-term success and weight maintenance depend on continued adherence to these strategies.   Gloria [name] was informed of the importance of frequent follow up visits to maximize her success  with intensive lifestyle modifications for her obesity and obesity related health conditions as recommended by USPSTF and CMS guidelines  [name], MD

## 2024-10-31 ENCOUNTER — Ambulatory Visit
Admission: RE | Admit: 2024-10-31 | Discharge: 2024-10-31 | Disposition: A | Discharge status: Home / Self Care | Source: Ambulatory Visit | Attending: Family Medicine | Admitting: Family Medicine

## 2024-10-31 ENCOUNTER — Other Ambulatory Visit: Payer: Self-pay | Admitting: Cardiology

## 2024-10-31 DIAGNOSIS — Z1231 Encounter for screening mammogram for malignant neoplasm of breast: Secondary | ICD-10-CM

## 2024-11-02 ENCOUNTER — Other Ambulatory Visit: Payer: Self-pay | Admitting: Family Medicine

## 2024-11-05 ENCOUNTER — Other Ambulatory Visit: Payer: Self-pay | Admitting: General Practice

## 2024-11-20 ENCOUNTER — Other Ambulatory Visit: Payer: Self-pay | Admitting: Family Medicine

## 2024-11-20 DIAGNOSIS — Z8669 Personal history of other diseases of the nervous system and sense organs: Secondary | ICD-10-CM

## 2024-11-27 ENCOUNTER — Telehealth (INDEPENDENT_AMBULATORY_CARE_PROVIDER_SITE_OTHER): Payer: Self-pay

## 2024-11-27 ENCOUNTER — Ambulatory Visit (INDEPENDENT_AMBULATORY_CARE_PROVIDER_SITE_OTHER): Payer: Self-pay | Admitting: Family Medicine

## 2024-11-27 NOTE — Telephone Encounter (Signed)
 Haddie Shaddix (Key: [id number]) Mounjaro  15MG /0.5ML auto-injectors

## 2024-11-29 NOTE — Telephone Encounter (Signed)
 CaseId:[id number]; Status:Approved; Review Type:Prior Auth; Coverage Start Date:11/27/2024; Coverage End Date:11/29/2025;.  Authorization Expiration Date: November 29, 2025.

## 2024-12-05 ENCOUNTER — Ambulatory Visit (INDEPENDENT_AMBULATORY_CARE_PROVIDER_SITE_OTHER): Admitting: Nurse Practitioner

## 2024-12-11 ENCOUNTER — Other Ambulatory Visit: Payer: Self-pay | Admitting: Cardiology

## 2024-12-27 ENCOUNTER — Other Ambulatory Visit (HOSPITAL_BASED_OUTPATIENT_CLINIC_OR_DEPARTMENT_OTHER): Payer: Self-pay

## 2024-12-27 ENCOUNTER — Encounter: Payer: Self-pay | Admitting: Cardiology

## 2024-12-27 ENCOUNTER — Ambulatory Visit (INDEPENDENT_AMBULATORY_CARE_PROVIDER_SITE_OTHER): Admitting: Family Medicine

## 2024-12-27 ENCOUNTER — Encounter (INDEPENDENT_AMBULATORY_CARE_PROVIDER_SITE_OTHER): Payer: Self-pay | Admitting: Family Medicine

## 2024-12-27 ENCOUNTER — Encounter (HOSPITAL_BASED_OUTPATIENT_CLINIC_OR_DEPARTMENT_OTHER): Payer: Self-pay | Admitting: Pharmacist Clinician (PhC)/ Clinical Pharmacy Specialist

## 2024-12-27 VITALS — BP 112/73 | HR 80 | Temp 97.9°F | Ht 64.5 in | Wt 211.0 lb

## 2024-12-27 DIAGNOSIS — F339 Major depressive disorder, recurrent, unspecified: Secondary | ICD-10-CM | POA: Diagnosis not present

## 2024-12-27 DIAGNOSIS — E669 Obesity, unspecified: Secondary | ICD-10-CM | POA: Diagnosis not present

## 2024-12-27 DIAGNOSIS — Z7985 Long-term (current) use of injectable non-insulin antidiabetic drugs: Secondary | ICD-10-CM

## 2024-12-27 DIAGNOSIS — E782 Mixed hyperlipidemia: Secondary | ICD-10-CM | POA: Diagnosis not present

## 2024-12-27 DIAGNOSIS — Z6835 Body mass index (BMI) 35.0-35.9, adult: Secondary | ICD-10-CM | POA: Diagnosis not present

## 2024-12-27 DIAGNOSIS — E1169 Type 2 diabetes mellitus with other specified complication: Secondary | ICD-10-CM

## 2024-12-27 MED ORDER — ROSUVASTATIN CALCIUM 20 MG PO TABS
20.0000 mg | ORAL_TABLET | Freq: Every day | ORAL | 0 refills | Status: AC
  Filled 2024-12-27: qty 15, 15d supply, fill #0

## 2024-12-27 NOTE — Progress Notes (Signed)
 "  Office: [phone]  /  Fax: [phone]  WEIGHT SUMMARY AND BIOMETRICS  Anthropometric Measurements Height: 5' 4.5 (1.638 m) Weight: 211 lb (95.7 kg) BMI (Calculated): 35.67 Weight at Last Visit: 215 lb Weight Lost Since Last Visit: 4 lb Weight Gained Since Last Visit: 0 Starting Weight: 274 lb Total Weight Loss (lbs): 63 lb (28.6 kg) Peak Weight: 289 lb   Body Composition  Body Fat %: 48 % Fat Mass (lbs): 101.6 lbs Muscle Mass (lbs): 104.4 lbs Total Body Water (lbs): 75.4 lbs Visceral Fat Rating : 13   Other Clinical Data Fasting: no Labs: no Today's Visit #: 50 Starting Date: 08/07/21    Chief Complaint: OBESITY   History of Present Illness Gloria [name] is a 54 year old female with obesity and type 2 diabetes who presents for a follow-up on her obesity treatment plan and diabetes management.  She has been adhering to the category two eating plan 75% of the time, increasing fruit intake but struggling with vegetable consumption. She consumes the recommended amount of protein, attempts to stay hydrated, and avoids skipping meals. She generally sleeps seven to nine hours per night but has not engaged in physical exercise recently. Over the past two months, she has lost four pounds.  For her type 2 diabetes, she is focusing on reducing simple carbohydrates and increasing lean protein intake. She is currently on Mounjaro  15 mg weekly and requests a refill.  She has a history of major depressive disorder since age 44, with episodes occurring every two to five years. She is currently experiencing a depressive episode that began in the summer and worsened by November, impacting her ability to work and attend [employer] She has been on Depakote 1500 mg daily for a month after tapering off oxcarbazepine . She is concerned about potential weight gain from Depakote and reports feeling 'blunted'.  She is preparing to go on short-term disability to focus on self-care and  plans to return to the gym. She has started seeing a new therapist twice a week and feels comfortable with her.  She has a history of hysterectomy with one and a half ovaries removed, leading to menopausal symptoms. She is concerned about the impact of menopause on her current condition.  She has been out of atorvastatin or rosuvastatin  for cholesterol management and is unsure about her last lab results, with the last cholesterol labs conducted in July.      PHYSICAL EXAM:  Blood pressure 112/73, pulse 80, temperature 97.9 F (36.6 C), height 5' 4.5 (1.638 m), weight 211 lb (95.7 kg), last menstrual period 04/25/2013, SpO2 97%. Body mass index is 35.66 kg/m.  DIAGNOSTIC DATA REVIEWED BY MYSELF TODAY:  BMET    Component Value Date/Time   NA 133 (L) 02/17/2024 1511   NA 140 01/18/2024 0952   K 4.7 02/17/2024 1511   CL 99 02/17/2024 1511   CO2 27 02/17/2024 1511   GLUCOSE 88 02/17/2024 1511   BUN 13 02/17/2024 1511   BUN 13 01/18/2024 0952   CREATININE 0.66 02/17/2024 1511   CALCIUM  8.9 02/17/2024 1511   GFRNONAA >60 12/29/2023 0420   GFRNONAA 106 09/19/2020 0847   GFRAA 123 09/19/2020 0847   Lab Results  Component Value Date   HGBA1C 5.0 02/17/2024   HGBA1C 6.1 (H) 10/27/2012   Lab Results  Component Value Date   INSULIN  41.2 (H) 07/14/2022   INSULIN  29.5 (H) 08/07/2021   Lab Results  Component Value Date   TSH 1.94 07/13/2023  CBC    Component Value Date/Time   WBC 10.6 (H) 12/29/2023 0420   RBC 4.32 12/29/2023 0420   HGB 13.3 12/29/2023 0420   HGB 13.2 08/07/2021 0000   HCT 38.5 12/29/2023 0420   HCT 40.2 08/07/2021 0000   PLT 245 12/29/2023 0420   PLT 284 08/07/2021 0000   MCV 89.1 12/29/2023 0420   MCV 89 08/07/2021 0000   MCH 30.8 12/29/2023 0420   MCHC 34.5 12/29/2023 0420   RDW 14.1 12/29/2023 0420   RDW 13.4 08/07/2021 0000   Iron  Studies    Component Value Date/Time   IRON  54 10/06/2017 1505   TIBC 304 10/06/2017 1505   FERRITIN 151 (H)  10/06/2017 1505   IRONPCTSAT 18 10/06/2017 1505   Lipid Panel     Component Value Date/Time   CHOL 148 05/22/2024 0935   TRIG 77 05/22/2024 0935   HDL 58 05/22/2024 0935   CHOLHDL 2.6 05/22/2024 0935   CHOLHDL 6 07/13/2023 0941   VLDL 39.0 07/13/2023 0941   LDLCALC 75 05/22/2024 0935   LDLCALC 171 (H) 09/19/2020 0847   LDLDIRECT 145.0 07/18/2019 1410   Hepatic Function Panel     Component Value Date/Time   PROT 7.1 01/18/2024 0952   ALBUMIN 4.4 01/18/2024 0952   AST 19 01/18/2024 0952   ALT 27 01/18/2024 0952   ALKPHOS 76 01/18/2024 0952   BILITOT <0.2 01/18/2024 0952      Component Value Date/Time   TSH 1.94 07/13/2023 0941   Nutritional Lab Results  Component Value Date   VD25OH 78 12/09/2023   VD25OH 76.34 07/13/2023   VD25OH 64.0 07/14/2022     Assessment and Plan Assessment & Plan Obesity Management is ongoing with a focus on dietary changes and weight loss. She has lost 4 pounds in the last two months, with 2 pounds being fat, 1 pound water, and 1 pound muscle. She is following the category two eating plan 75% of the time, increasing fruit intake, and maintaining protein intake. Hydration and meal regularity are being addressed. Exercise has been lacking due to recent depression and weather conditions. She plans to return to the gym as part of self-care. - Continue category two eating plan - Encouraged increased vegetable intake - Encouraged regular exercise, including gym attendance - Monitor weight and body composition changes  Type 2 diabetes mellitus with other specified complication Type 2 diabetes is managed with dietary modifications and Mounjaro  15 mg weekly. She requests a refill of Mounjaro . Recent dietary indiscretions due to weather and comfort food consumption noted, but overall weight loss indicates effective management. - Refilled Mounjaro  15 mg weekly - Continue dietary modifications to reduce simple carbohydrates and increase lean  proteins  Major depressive disorder, recurrent Recurrent major depressive disorder with recent exacerbation since summer, leading to significant functional impairment. She has been off oxcarbazepine  and started on Depakote 500 mg at night. She is experiencing side effects such as potential weight gain and hair loss. She is under the care of a new therapist and has taken short-term disability leave to focus on self-care. She is aware of the potential for weight gain with Depakote and is prioritizing mental health over weight management. - Continue Depakote 500 mg at night - Continue therapy sessions twice a week - Monitor for side effects of Depakote, including weight gain and hair loss - Encouraged self-care and gradual return to normal activities  Mixed hyperlipidemia Managed with rosuvastatin . She has been out of medication and requires a refill. Last cholesterol  labs were in July. - Refilled rosuvastatin  - Ordered cholesterol labs      Patients who are on anti-obesity medications are counseled on the importance of maintaining healthy lifestyle habits, including balanced nutrition, regular physical activity, and behavioral modifications,  Medication is an adjunct to, not a replacement for, lifestyle changes and that the long-term success and weight maintenance depend on continued adherence to these strategies.   Glenola was informed of the importance of frequent follow up visits to maximize her success with intensive lifestyle modifications for her obesity and obesity related health conditions as recommended by USPSTF and CMS guidelines  [name], MD   "

## 2024-12-27 NOTE — Progress Notes (Deleted)
 "  Office: [phone]  /  Fax: [phone]  WEIGHT SUMMARY AND BIOMETRICS  Anthropometric Measurements Height: 5' 4.5 (1.638 m) Weight: 211 lb (95.7 kg) BMI (Calculated): 35.67 Weight at Last Visit: 215 lb Weight Lost Since Last Visit: 4 lb Weight Gained Since Last Visit: 0 Starting Weight: 274 lb Total Weight Loss (lbs): 63 lb (28.6 kg) Peak Weight: 289 lb   Body Composition  Body Fat %: 48 % Fat Mass (lbs): 101.6 lbs Muscle Mass (lbs): 104.4 lbs Total Body Water (lbs): 75.4 lbs Visceral Fat Rating : 13   Other Clinical Data Fasting: no Labs: no Today's Visit #: 50 Starting Date: 08/07/21    Chief Complaint: OBESITY   Discussed the use of AI scribe software for clinical note transcription with the patient, who gave verbal consent to proceed.  History of Present Illness       PHYSICAL EXAM:  Blood pressure 112/73, pulse 80, temperature 97.9 F (36.6 C), height 5' 4.5 (1.638 m), weight 211 lb (95.7 kg), last menstrual period 04/25/2013, SpO2 97%. Body mass index is 35.66 kg/m.  DIAGNOSTIC DATA REVIEWED BY MYSELF TODAY:  BMET    Component Value Date/Time   NA 133 (L) 02/17/2024 1511   NA 140 01/18/2024 0952   K 4.7 02/17/2024 1511   CL 99 02/17/2024 1511   CO2 27 02/17/2024 1511   GLUCOSE 88 02/17/2024 1511   BUN 13 02/17/2024 1511   BUN 13 01/18/2024 0952   CREATININE 0.66 02/17/2024 1511   CALCIUM  8.9 02/17/2024 1511   GFRNONAA >60 12/29/2023 0420   GFRNONAA 106 09/19/2020 0847   GFRAA 123 09/19/2020 0847   Lab Results  Component Value Date   HGBA1C 5.0 02/17/2024   HGBA1C 6.1 (H) 10/27/2012   Lab Results  Component Value Date   INSULIN  41.2 (H) 07/14/2022   INSULIN  29.5 (H) 08/07/2021   Lab Results  Component Value Date   TSH 1.94 07/13/2023   CBC    Component Value Date/Time   WBC 10.6 (H) 12/29/2023 0420   RBC 4.32 12/29/2023 0420   HGB 13.3 12/29/2023 0420   HGB 13.2 08/07/2021 0000   HCT 38.5 12/29/2023 0420   HCT  40.2 08/07/2021 0000   PLT 245 12/29/2023 0420   PLT 284 08/07/2021 0000   MCV 89.1 12/29/2023 0420   MCV 89 08/07/2021 0000   MCH 30.8 12/29/2023 0420   MCHC 34.5 12/29/2023 0420   RDW 14.1 12/29/2023 0420   RDW 13.4 08/07/2021 0000   Iron  Studies    Component Value Date/Time   IRON  54 10/06/2017 1505   TIBC 304 10/06/2017 1505   FERRITIN 151 (H) 10/06/2017 1505   IRONPCTSAT 18 10/06/2017 1505   Lipid Panel     Component Value Date/Time   CHOL 148 05/22/2024 0935   TRIG 77 05/22/2024 0935   HDL 58 05/22/2024 0935   CHOLHDL 2.6 05/22/2024 0935   CHOLHDL 6 07/13/2023 0941   VLDL 39.0 07/13/2023 0941   LDLCALC 75 05/22/2024 0935   LDLCALC 171 (H) 09/19/2020 0847   LDLDIRECT 145.0 07/18/2019 1410   Hepatic Function Panel     Component Value Date/Time   PROT 7.1 01/18/2024 0952   ALBUMIN 4.4 01/18/2024 0952   AST 19 01/18/2024 0952   ALT 27 01/18/2024 0952   ALKPHOS 76 01/18/2024 0952   BILITOT <0.2 01/18/2024 0952      Component Value Date/Time   TSH 1.94 07/13/2023 0941   Nutritional Lab Results  Component Value Date  VD25OH 78 12/09/2023   VD25OH 76.34 07/13/2023   VD25OH 64.0 07/14/2022     Assessment and Plan Assessment & Plan       Patients who are on anti-obesity medications are counseled on the importance of maintaining healthy lifestyle habits, including balanced nutrition, regular physical activity, and behavioral modifications,  Medication is an adjunct to, not a replacement for, lifestyle changes and that the long-term success and weight maintenance depend on continued adherence to these strategies.   Gloria [name] was informed of the importance of frequent follow up visits to maximize her success with intensive lifestyle modifications for her obesity and obesity related health conditions as recommended by USPSTF and CMS guidelines  I personally spent a total of *** minutes in the care of the patient today including  {[id number]}.  [name], MD   "

## 2024-12-28 LAB — LIPID PANEL WITH LDL/HDL RATIO
Cholesterol, Total: 221 mg/dL — ABNORMAL HIGH (ref 100–199)
HDL: 48 mg/dL
LDL Chol Calc (NIH): 131 mg/dL — ABNORMAL HIGH (ref 0–99)
LDL/HDL Ratio: 2.7 ratio (ref 0.0–3.2)
Triglycerides: 237 mg/dL — ABNORMAL HIGH (ref 0–149)
VLDL Cholesterol Cal: 42 mg/dL — ABNORMAL HIGH (ref 5–40)

## 2024-12-28 LAB — CMP14+EGFR
ALT: 51 [IU]/L — ABNORMAL HIGH (ref 0–32)
AST: 43 [IU]/L — ABNORMAL HIGH (ref 0–40)
Albumin: 4.2 g/dL (ref 3.8–4.9)
Alkaline Phosphatase: 54 [IU]/L (ref 49–135)
BUN/Creatinine Ratio: 28 — ABNORMAL HIGH (ref 9–23)
BUN: 13 mg/dL (ref 6–24)
Bilirubin Total: 0.3 mg/dL (ref 0.0–1.2)
CO2: 22 mmol/L (ref 20–29)
Calcium: 9 mg/dL (ref 8.7–10.2)
Chloride: 101 mmol/L (ref 96–106)
Creatinine, Ser: 0.47 mg/dL — ABNORMAL LOW (ref 0.57–1.00)
Globulin, Total: 2.7 g/dL (ref 1.5–4.5)
Glucose: 89 mg/dL (ref 70–99)
Potassium: 5.1 mmol/L (ref 3.5–5.2)
Sodium: 140 mmol/L (ref 134–144)
Total Protein: 6.9 g/dL (ref 6.0–8.5)
eGFR: 113 mL/min/{1.73_m2}

## 2024-12-28 LAB — HEMOGLOBIN A1C
Est. average glucose Bld gHb Est-mCnc: 100 mg/dL
Hgb A1c MFr Bld: 5.1 % (ref 4.8–5.6)

## 2024-12-28 LAB — VITAMIN D 25 HYDROXY (VIT D DEFICIENCY, FRACTURES): Vit D, 25-Hydroxy: 87.8 ng/mL (ref 30.0–100.0)

## 2024-12-28 LAB — INSULIN, RANDOM: INSULIN: 24.8 u[IU]/mL (ref 2.6–24.9)

## 2024-12-28 LAB — TSH: TSH: 3.09 u[IU]/mL (ref 0.450–4.500)

## 2025-01-03 ENCOUNTER — Ambulatory Visit: Admitting: Obstetrics and Gynecology

## 2025-01-24 ENCOUNTER — Ambulatory Visit (INDEPENDENT_AMBULATORY_CARE_PROVIDER_SITE_OTHER): Admitting: Family Medicine

## 2025-01-28 ENCOUNTER — Ambulatory Visit: Admitting: Obstetrics and Gynecology

## 2025-02-21 ENCOUNTER — Ambulatory Visit (INDEPENDENT_AMBULATORY_CARE_PROVIDER_SITE_OTHER): Admitting: Family Medicine
